# Patient Record
Sex: Female | Born: 1960
Health system: Southern US, Community
[De-identification: ages and names within clinical notes are randomized; demographics above are authoritative.]

## PROBLEM LIST (undated history)

## (undated) DIAGNOSIS — Z9109 Other allergy status, other than to drugs and biological substances: Secondary | ICD-10-CM

## (undated) DIAGNOSIS — Z8041 Family history of malignant neoplasm of ovary: Secondary | ICD-10-CM

## (undated) DIAGNOSIS — Z87442 Personal history of urinary calculi: Secondary | ICD-10-CM

## (undated) DIAGNOSIS — Z950 Presence of cardiac pacemaker: Secondary | ICD-10-CM

## (undated) DIAGNOSIS — IMO0002 Reserved for concepts with insufficient information to code with codable children: Secondary | ICD-10-CM

## (undated) DIAGNOSIS — E0590 Thyrotoxicosis, unspecified without thyrotoxic crisis or storm: Secondary | ICD-10-CM

## (undated) DIAGNOSIS — I10 Essential (primary) hypertension: Secondary | ICD-10-CM

## (undated) DIAGNOSIS — E785 Hyperlipidemia, unspecified: Secondary | ICD-10-CM

## (undated) DIAGNOSIS — F419 Anxiety disorder, unspecified: Secondary | ICD-10-CM

## (undated) DIAGNOSIS — J849 Interstitial pulmonary disease, unspecified: Secondary | ICD-10-CM

## (undated) DIAGNOSIS — K219 Gastro-esophageal reflux disease without esophagitis: Secondary | ICD-10-CM

## (undated) DIAGNOSIS — J8410 Pulmonary fibrosis, unspecified: Secondary | ICD-10-CM

## (undated) HISTORY — PX: COSMETIC SURGERY: SHX468

## (undated) HISTORY — PX: TUBAL LIGATION: SHX77

## (undated) HISTORY — DX: Essential (primary) hypertension: I10

## (undated) HISTORY — DX: Other allergy status, other than to drugs and biological substances: Z91.09

## (undated) HISTORY — DX: Reserved for concepts with insufficient information to code with codable children: IMO0002

## (undated) HISTORY — DX: Family history of malignant neoplasm of ovary: Z80.41

## (undated) HISTORY — PX: MOHS SURGERY: SUR867

## (undated) HISTORY — DX: Anxiety disorder, unspecified: F41.9

## (undated) HISTORY — DX: Thyrotoxicosis, unspecified without thyrotoxic crisis or storm: E05.90

## (undated) HISTORY — DX: Hyperlipidemia, unspecified: E78.5

## (undated) HISTORY — DX: Interstitial pulmonary disease, unspecified: J84.9

---

## 2004-10-23 ENCOUNTER — Ambulatory Visit: Payer: Self-pay | Admitting: General Surgery

## 2005-05-13 ENCOUNTER — Ambulatory Visit: Payer: Self-pay | Admitting: General Surgery

## 2005-06-10 ENCOUNTER — Ambulatory Visit: Payer: Self-pay | Admitting: Internal Medicine

## 2005-07-22 ENCOUNTER — Ambulatory Visit: Payer: Self-pay | Admitting: Internal Medicine

## 2005-08-19 ENCOUNTER — Ambulatory Visit: Payer: Self-pay | Admitting: Internal Medicine

## 2005-10-25 ENCOUNTER — Ambulatory Visit: Payer: Self-pay | Admitting: Internal Medicine

## 2005-11-05 ENCOUNTER — Ambulatory Visit: Payer: Self-pay | Admitting: Internal Medicine

## 2006-06-13 ENCOUNTER — Ambulatory Visit: Payer: Self-pay | Admitting: Internal Medicine

## 2006-10-27 ENCOUNTER — Ambulatory Visit: Payer: Self-pay | Admitting: Internal Medicine

## 2007-03-15 ENCOUNTER — Ambulatory Visit: Payer: Self-pay | Admitting: Family Medicine

## 2007-03-15 ENCOUNTER — Encounter (INDEPENDENT_AMBULATORY_CARE_PROVIDER_SITE_OTHER): Payer: Self-pay | Admitting: Internal Medicine

## 2007-03-15 DIAGNOSIS — R300 Dysuria: Secondary | ICD-10-CM | POA: Insufficient documentation

## 2007-03-15 LAB — CONVERTED CEMR LAB
Bilirubin Urine: NEGATIVE
Glucose, Urine, Semiquant: NEGATIVE
Ketones, urine, test strip: NEGATIVE
Nitrite: NEGATIVE
Protein, U semiquant: NEGATIVE
Specific Gravity, Urine: <1.005
Urobilinogen, UA: NEGATIVE
WBC Urine, dipstick: NEGATIVE
pH: 6.5

## 2007-03-16 ENCOUNTER — Telehealth (INDEPENDENT_AMBULATORY_CARE_PROVIDER_SITE_OTHER): Payer: Self-pay | Admitting: *Deleted

## 2007-05-30 ENCOUNTER — Telehealth (INDEPENDENT_AMBULATORY_CARE_PROVIDER_SITE_OTHER): Payer: Self-pay | Admitting: *Deleted

## 2007-06-05 DIAGNOSIS — J309 Allergic rhinitis, unspecified: Secondary | ICD-10-CM | POA: Insufficient documentation

## 2007-06-05 DIAGNOSIS — F411 Generalized anxiety disorder: Secondary | ICD-10-CM | POA: Insufficient documentation

## 2007-06-05 DIAGNOSIS — I10 Essential (primary) hypertension: Secondary | ICD-10-CM | POA: Insufficient documentation

## 2007-06-21 ENCOUNTER — Ambulatory Visit: Payer: Self-pay | Admitting: Internal Medicine

## 2007-06-21 DIAGNOSIS — E785 Hyperlipidemia, unspecified: Secondary | ICD-10-CM | POA: Insufficient documentation

## 2007-07-04 ENCOUNTER — Telehealth (INDEPENDENT_AMBULATORY_CARE_PROVIDER_SITE_OTHER): Payer: Self-pay | Admitting: *Deleted

## 2007-08-28 ENCOUNTER — Telehealth (INDEPENDENT_AMBULATORY_CARE_PROVIDER_SITE_OTHER): Payer: Self-pay | Admitting: *Deleted

## 2007-10-25 ENCOUNTER — Telehealth (INDEPENDENT_AMBULATORY_CARE_PROVIDER_SITE_OTHER): Payer: Self-pay | Admitting: *Deleted

## 2008-02-15 ENCOUNTER — Telehealth (INDEPENDENT_AMBULATORY_CARE_PROVIDER_SITE_OTHER): Payer: Self-pay | Admitting: *Deleted

## 2008-04-15 ENCOUNTER — Telehealth (INDEPENDENT_AMBULATORY_CARE_PROVIDER_SITE_OTHER): Payer: Self-pay | Admitting: *Deleted

## 2008-05-29 ENCOUNTER — Ambulatory Visit: Payer: Self-pay | Admitting: Family Medicine

## 2008-06-14 ENCOUNTER — Telehealth: Payer: Self-pay | Admitting: Internal Medicine

## 2008-07-04 ENCOUNTER — Ambulatory Visit: Payer: Self-pay | Admitting: Family Medicine

## 2008-07-04 LAB — CONVERTED CEMR LAB

## 2008-07-15 ENCOUNTER — Telehealth (INDEPENDENT_AMBULATORY_CARE_PROVIDER_SITE_OTHER): Payer: Self-pay | Admitting: *Deleted

## 2008-07-29 ENCOUNTER — Ambulatory Visit: Payer: Self-pay | Admitting: Family Medicine

## 2008-08-13 ENCOUNTER — Telehealth (INDEPENDENT_AMBULATORY_CARE_PROVIDER_SITE_OTHER): Payer: Self-pay | Admitting: *Deleted

## 2008-08-28 ENCOUNTER — Ambulatory Visit: Payer: Self-pay | Admitting: Family Medicine

## 2008-08-28 DIAGNOSIS — Z87891 Personal history of nicotine dependence: Secondary | ICD-10-CM | POA: Insufficient documentation

## 2008-09-29 LAB — HM PAP SMEAR

## 2008-09-29 LAB — CONVERTED CEMR LAB: Pap Smear: NORMAL

## 2008-09-29 LAB — HM MAMMOGRAPHY: HM Mammogram: NORMAL

## 2008-10-16 ENCOUNTER — Encounter: Payer: Self-pay | Admitting: Family Medicine

## 2008-11-08 ENCOUNTER — Telehealth: Payer: Self-pay | Admitting: Family Medicine

## 2008-12-09 ENCOUNTER — Telehealth: Payer: Self-pay | Admitting: Family Medicine

## 2009-01-08 ENCOUNTER — Telehealth: Payer: Self-pay | Admitting: Family Medicine

## 2009-01-16 ENCOUNTER — Ambulatory Visit: Payer: Self-pay | Admitting: Family Medicine

## 2009-02-03 ENCOUNTER — Telehealth: Payer: Self-pay | Admitting: Family Medicine

## 2009-02-13 ENCOUNTER — Ambulatory Visit: Payer: Self-pay | Admitting: Family Medicine

## 2009-03-05 ENCOUNTER — Telehealth: Payer: Self-pay | Admitting: Family Medicine

## 2009-03-31 ENCOUNTER — Telehealth: Payer: Self-pay | Admitting: Family Medicine

## 2009-04-29 ENCOUNTER — Telehealth: Payer: Self-pay | Admitting: Family Medicine

## 2009-06-04 ENCOUNTER — Telehealth: Payer: Self-pay | Admitting: Family Medicine

## 2009-06-18 ENCOUNTER — Ambulatory Visit: Payer: Self-pay | Admitting: Family Medicine

## 2009-07-31 ENCOUNTER — Telehealth: Payer: Self-pay | Admitting: Family Medicine

## 2009-08-12 ENCOUNTER — Ambulatory Visit: Payer: Self-pay | Admitting: Family Medicine

## 2009-08-12 DIAGNOSIS — M545 Low back pain, unspecified: Secondary | ICD-10-CM | POA: Insufficient documentation

## 2009-08-12 LAB — CONVERTED CEMR LAB
Bacteria, UA: 0
Bilirubin Urine: NEGATIVE
Glucose, Urine, Semiquant: NEGATIVE
Ketones, urine, test strip: NEGATIVE
Nitrite: NEGATIVE
RBC / HPF: 0
Specific Gravity, Urine: 1.01
Urobilinogen, UA: 0.2
WBC Urine, dipstick: NEGATIVE
WBC, UA: 0 cells/hpf
pH: 7.5

## 2009-08-13 ENCOUNTER — Encounter (INDEPENDENT_AMBULATORY_CARE_PROVIDER_SITE_OTHER): Payer: Self-pay | Admitting: Internal Medicine

## 2009-09-22 ENCOUNTER — Telehealth: Payer: Self-pay | Admitting: Family Medicine

## 2009-12-03 ENCOUNTER — Telehealth: Payer: Self-pay | Admitting: Family Medicine

## 2009-12-16 ENCOUNTER — Ambulatory Visit: Payer: Self-pay | Admitting: Family Medicine

## 2009-12-16 LAB — CONVERTED CEMR LAB
ALT: 15 units/L (ref 0–35)
AST: 18 units/L (ref 0–37)
Albumin: 4.3 g/dL (ref 3.5–5.2)
Alkaline Phosphatase: 53 units/L (ref 39–117)
BUN: 11 mg/dL (ref 6–23)
Basophils Absolute: 0 10*3/uL (ref 0.0–0.1)
Basophils Relative: 0.4 % (ref 0.0–3.0)
Bilirubin, Direct: 0.1 mg/dL (ref 0.0–0.3)
CO2: 31 meq/L (ref 19–32)
Calcium: 9.7 mg/dL (ref 8.4–10.5)
Chloride: 101 meq/L (ref 96–112)
Cholesterol: 241 mg/dL — ABNORMAL HIGH (ref 0–200)
Creatinine, Ser: 0.6 mg/dL (ref 0.4–1.2)
Direct LDL: 169.3 mg/dL
Eosinophils Absolute: 0.1 10*3/uL (ref 0.0–0.7)
Eosinophils Relative: 1.2 % (ref 0.0–5.0)
GFR calc non Af Amer: 113.88 mL/min (ref >60)
Glucose, Bld: 104 mg/dL — ABNORMAL HIGH (ref 70–99)
HCT: 41 % (ref 36.0–46.0)
HDL: 42 mg/dL (ref >39.00)
Hemoglobin: 14.1 g/dL (ref 12.0–15.0)
Lymphocytes Relative: 32.5 % (ref 12.0–46.0)
Lymphs Abs: 2.9 10*3/uL (ref 0.7–4.0)
MCHC: 34.3 g/dL (ref 30.0–36.0)
MCV: 93.3 fL (ref 78.0–100.0)
Monocytes Absolute: 0.6 10*3/uL (ref 0.1–1.0)
Monocytes Relative: 6.9 % (ref 3.0–12.0)
Neutro Abs: 5.3 10*3/uL (ref 1.4–7.7)
Neutrophils Relative %: 59 % (ref 43.0–77.0)
Platelets: 225 10*3/uL (ref 150.0–400.0)
Potassium: 3.8 meq/L (ref 3.5–5.1)
RBC: 4.39 M/uL (ref 3.87–5.11)
RDW: 11.7 % (ref 11.5–14.6)
Sodium: 143 meq/L (ref 135–145)
TSH: 1.6 microintl units/mL (ref 0.35–5.50)
Total Bilirubin: 0.7 mg/dL (ref 0.3–1.2)
Total CHOL/HDL Ratio: 6
Total Protein: 7.8 g/dL (ref 6.0–8.3)
Triglycerides: 267 mg/dL — ABNORMAL HIGH (ref 0.0–149.0)
VLDL: 53.4 mg/dL — ABNORMAL HIGH (ref 0.0–40.0)
WBC: 8.9 10*3/uL (ref 4.5–10.5)

## 2009-12-17 LAB — CONVERTED CEMR LAB
Cholesterol: 247 mg/dL — ABNORMAL HIGH (ref 0–200)
Direct LDL: 173.1 mg/dL
HDL: 40 mg/dL (ref >39.00)
Total CHOL/HDL Ratio: 6
Triglycerides: 270 mg/dL — ABNORMAL HIGH (ref 0.0–149.0)
VLDL: 54 mg/dL — ABNORMAL HIGH (ref 0.0–40.0)

## 2009-12-22 ENCOUNTER — Ambulatory Visit: Payer: Self-pay | Admitting: Family Medicine

## 2009-12-22 LAB — CONVERTED CEMR LAB
Cholesterol, target level: 200 mg/dL
HDL goal, serum: 40 mg/dL
LDL Goal: 130 mg/dL

## 2010-01-01 ENCOUNTER — Encounter: Payer: Self-pay | Admitting: Family Medicine

## 2010-01-05 ENCOUNTER — Telehealth: Payer: Self-pay | Admitting: Family Medicine

## 2010-02-03 ENCOUNTER — Encounter (INDEPENDENT_AMBULATORY_CARE_PROVIDER_SITE_OTHER): Payer: Self-pay | Admitting: *Deleted

## 2010-02-09 ENCOUNTER — Telehealth: Payer: Self-pay | Admitting: Family Medicine

## 2010-06-10 ENCOUNTER — Ambulatory Visit: Payer: Self-pay | Admitting: Family Medicine

## 2010-07-14 ENCOUNTER — Telehealth: Payer: Self-pay | Admitting: Family Medicine

## 2010-08-25 ENCOUNTER — Encounter: Payer: Self-pay | Admitting: Family Medicine

## 2010-08-27 ENCOUNTER — Ambulatory Visit: Payer: Self-pay | Admitting: Family Medicine

## 2010-08-27 ENCOUNTER — Encounter: Payer: Self-pay | Admitting: Family Medicine

## 2010-08-27 DIAGNOSIS — J8410 Pulmonary fibrosis, unspecified: Secondary | ICD-10-CM | POA: Insufficient documentation

## 2010-08-27 DIAGNOSIS — R0609 Other forms of dyspnea: Secondary | ICD-10-CM | POA: Insufficient documentation

## 2010-08-27 DIAGNOSIS — R0989 Other specified symptoms and signs involving the circulatory and respiratory systems: Secondary | ICD-10-CM | POA: Insufficient documentation

## 2010-08-29 LAB — CONVERTED CEMR LAB
ALT: 17 units/L (ref 0–35)
AST: 21 units/L (ref 0–37)
Albumin: 4.6 g/dL (ref 3.5–5.2)
Alkaline Phosphatase: 71 units/L (ref 39–117)
BUN: 11 mg/dL (ref 6–23)
Basophils Absolute: 0 10*3/uL (ref 0.0–0.1)
Basophils Relative: 0.4 % (ref 0.0–3.0)
Bilirubin, Direct: 0.1 mg/dL (ref 0.0–0.3)
CO2: 29 meq/L (ref 19–32)
CRP, High Sensitivity: 2.33 (ref 0.00–5.00)
Calcium: 9.8 mg/dL (ref 8.4–10.5)
Chloride: 100 meq/L (ref 96–112)
Creatinine, Ser: 0.5 mg/dL (ref 0.4–1.2)
Eosinophils Absolute: 0 10*3/uL (ref 0.0–0.7)
Eosinophils Relative: 0.5 % (ref 0.0–5.0)
GFR calc non Af Amer: 133.93 mL/min (ref >60.00)
Glucose, Bld: 92 mg/dL (ref 70–99)
HCT: 44.1 % (ref 36.0–46.0)
Hemoglobin: 15.3 g/dL — ABNORMAL HIGH (ref 12.0–15.0)
Lymphocytes Relative: 31.2 % (ref 12.0–46.0)
Lymphs Abs: 3.2 10*3/uL (ref 0.7–4.0)
MCHC: 34.8 g/dL (ref 30.0–36.0)
MCV: 93.9 fL (ref 78.0–100.0)
Monocytes Absolute: 0.6 10*3/uL (ref 0.1–1.0)
Monocytes Relative: 6.1 % (ref 3.0–12.0)
Neutro Abs: 6.4 10*3/uL (ref 1.4–7.7)
Neutrophils Relative %: 61.8 % (ref 43.0–77.0)
Platelets: 277 10*3/uL (ref 150.0–400.0)
Potassium: 4.1 meq/L (ref 3.5–5.1)
RBC: 4.69 M/uL (ref 3.87–5.11)
RDW: 12.6 % (ref 11.5–14.6)
Sed Rate: 16 mm/hr (ref 0–22)
Sodium: 137 meq/L (ref 135–145)
Total Bilirubin: 0.7 mg/dL (ref 0.3–1.2)
Total Protein: 7.8 g/dL (ref 6.0–8.3)
WBC: 10.4 10*3/uL (ref 4.5–10.5)

## 2010-10-06 ENCOUNTER — Ambulatory Visit
Admission: RE | Admit: 2010-10-06 | Discharge: 2010-10-06 | Payer: Self-pay | Source: Home / Self Care | Attending: Pulmonary Disease | Admitting: Pulmonary Disease

## 2010-10-06 DIAGNOSIS — R9389 Abnormal findings on diagnostic imaging of other specified body structures: Secondary | ICD-10-CM | POA: Insufficient documentation

## 2010-10-08 ENCOUNTER — Ambulatory Visit: Payer: Self-pay | Admitting: Internal Medicine

## 2010-10-12 ENCOUNTER — Other Ambulatory Visit: Payer: Self-pay | Admitting: Pulmonary Disease

## 2010-10-12 ENCOUNTER — Ambulatory Visit
Admission: RE | Admit: 2010-10-12 | Discharge: 2010-10-12 | Payer: Self-pay | Source: Home / Self Care | Attending: Pulmonary Disease | Admitting: Pulmonary Disease

## 2010-10-12 ENCOUNTER — Encounter: Payer: Self-pay | Admitting: Pulmonary Disease

## 2010-10-12 LAB — CBC WITH DIFFERENTIAL/PLATELET
Basophils Absolute: 0.1 10*3/uL (ref 0.0–0.1)
Basophils Relative: 0.6 % (ref 0.0–3.0)
Eosinophils Absolute: 0.1 10*3/uL (ref 0.0–0.7)
Eosinophils Relative: 0.9 % (ref 0.0–5.0)
HCT: 44.3 % (ref 36.0–46.0)
Hemoglobin: 15.1 g/dL — ABNORMAL HIGH (ref 12.0–15.0)
Lymphocytes Relative: 31.4 % (ref 12.0–46.0)
Lymphs Abs: 3.6 10*3/uL (ref 0.7–4.0)
MCHC: 34 g/dL (ref 30.0–36.0)
MCV: 93.7 fl (ref 78.0–100.0)
Monocytes Absolute: 0.8 10*3/uL (ref 0.1–1.0)
Monocytes Relative: 6.6 % (ref 3.0–12.0)
Neutro Abs: 6.9 10*3/uL (ref 1.4–7.7)
Neutrophils Relative %: 60.5 % (ref 43.0–77.0)
Platelets: 247 10*3/uL (ref 150.0–400.0)
RBC: 4.73 Mil/uL (ref 3.87–5.11)
RDW: 12.4 % (ref 11.5–14.6)
WBC: 11.5 10*3/uL — ABNORMAL HIGH (ref 4.5–10.5)

## 2010-10-12 LAB — CONVERTED CEMR LAB
Anti Nuclear Antibody(ANA): NEGATIVE
Rhuematoid fact SerPl-aCnc: <10 intl units/mL (ref <=14)

## 2010-10-12 LAB — PROTIME-INR
INR: 1 ratio (ref 0.8–1.0)
Prothrombin Time: 11.1 s (ref 10.2–12.4)

## 2010-10-12 LAB — SEDIMENTATION RATE: Sed Rate: 16 mm/hr (ref 0–22)

## 2010-10-12 LAB — APTT: aPTT: 30.1 s — ABNORMAL HIGH (ref 21.7–28.8)

## 2010-10-14 ENCOUNTER — Telehealth (INDEPENDENT_AMBULATORY_CARE_PROVIDER_SITE_OTHER): Payer: Self-pay | Admitting: *Deleted

## 2010-10-19 ENCOUNTER — Telehealth: Payer: Self-pay | Admitting: Pulmonary Disease

## 2010-10-19 ENCOUNTER — Encounter: Payer: Self-pay | Admitting: Pulmonary Disease

## 2010-10-19 ENCOUNTER — Ambulatory Visit
Admission: RE | Admit: 2010-10-19 | Discharge: 2010-10-19 | Payer: Self-pay | Source: Home / Self Care | Attending: Pulmonary Disease | Admitting: Pulmonary Disease

## 2010-10-21 NOTE — Progress Notes (Signed)
Summary: refill request for lorazeapam  Phone Note Refill Request Message from:  Fax from Pharmacy  Refills Requested: Medication #1:  ATIVAN 0.5 MG  TABS 1 by mouth two times a day as needed anxiety   Last Refilled: 03/31/2009 Faxed request from [facility],  phone [phone].  Initial call taken by: Lowella Petties CMA,  March 31, 2009 12:09 PM  Follow-up for Phone Call        I filled 03/05/2009 with 1 refill - should be good through now until mid August Follow-up by: [name] MD,  March 31, 2009 12:17 PM  Additional Follow-up for Phone Call Additional follow up Details #1::        they just wanted to have one on refill because she is on automatic refill. So she is good until the end or middle of next month. Additional Follow-up by: Benny Lennert CMA,  March 31, 2009 1:58 PM

## 2010-10-21 NOTE — Progress Notes (Signed)
Summary: lorazepam  Phone Note Refill Request Message from:  Fax from Pharmacy on Feb 03, 2009 2:03 PM  Refills Requested: Medication #1:  ATIVAN 0.5 MG  TABS 1 by mouth two times a day as needed anxiety   Supply Requested: [facility] [phone]  Initial call taken by: Benny Lennert CMA,  Feb 03, 2009 2:04 PM  Follow-up for Phone Call        The patient was sent #60 at 01/08/2009, a little early. Also has one refill, so should be able to refill 02/07/2009 Follow-up by: [name] MD,  Feb 03, 2009 2:08 PM  Additional Follow-up for Phone Call Additional follow up Details #1::        per pharmacy patient picked up last refill. so they were letting us know Additional Follow-up by: Benny Lennert CMA,  Feb 03, 2009 2:36 PM

## 2010-10-21 NOTE — Progress Notes (Signed)
  Phone Note Call from Patient Call back at cell [phone]   Caller: Patient Call For: letvak Summary of Call: pt was seen yesterday for possible uti, pt was prescribed "the pill that turs your urine orange" pt is having a hard time with symptoms and wants rx called in for abx.  [facility] [address] Initial call taken by: Liane Comber,  March 16, 2007 1:21 PM  Follow-up for Phone Call        Rx attatched.  Culture to be back tomorrow  Additional Follow-up for Phone Call Additional follow up Details #1::        rx called in ..................................................................Marland KitchenLiane Comber  March 16, 2007 4:54 PM   New/Updated Medications: CIPROFLOXACIN HCL 500 MG  TABS (CIPROFLOXACIN HCL) 1 bid  New/Updated Medications: CIPROFLOXACIN HCL 500 MG  TABS (CIPROFLOXACIN HCL) 1 bid  Prescriptions: CIPROFLOXACIN HCL 500 MG  TABS (CIPROFLOXACIN HCL) 1 bid  #14 x 0   Entered and Authorized by:   Gildardo Griffes FNP   Signed by:   Gildardo Griffes FNP on 03/16/2007   Method used:   Print then Give to Patient   RxID:   [id number]

## 2010-10-21 NOTE — Progress Notes (Signed)
Summary: refill request for lorazepam  Phone Note Refill Request Message from:  Fax from Pharmacy  Refills Requested: Medication #1:  ATIVAN 0.5 MG  TABS 1 by mouth two times a day as needed anxiety   Last Refilled: 06/04/2009 Faxed request from [facility], [place], phone [phone].  Initial call taken by: Lowella Petties CMA,  September 22, 2009 8:54 AM  Follow-up for Phone Call        Appears she has not been weaning as planned on 05/2009.Anne KitchenNot early for refills. Will fill one time..further refills on Dr. Cyndie Chime return.  Follow-up by: Kerby Nora MD,  September 22, 2009 9:00 AM  Additional Follow-up for Phone Call Additional follow up Details #1::        Medication phoned to pharmacy.  Additional Follow-up by: Delilah Shan CMA (AAMA),  September 22, 2009 9:07 AM    Prescriptions: ATIVAN 0.5 MG  TABS (LORAZEPAM) 1 by mouth two times a day as needed anxiety  #60 x 0   Entered and Authorized by:   Kerby Nora MD   Signed by:   Kerby Nora MD on 09/22/2009   Method used:   Telephoned to ...       [facility] [address] (retail)       [address] [place], [place]  [address]       Ph: [phone]       Fax: [phone]   RxID:   [id number]

## 2010-10-21 NOTE — Progress Notes (Signed)
  Phone Note From Pharmacy   Caller: [facility] [phone] Summary of Call: refill lorazepam 0.5mg   1 two times a day as needed  #60 Initial call taken by: Wandra Mannan,  July 04, 2007 8:20 AM  Follow-up for Phone Call        #60 x 1 approved Follow-up by: Cindee Salt MD,  July 04, 2007 10:23 AM  Additional Follow-up for Phone Call Additional follow up Details #1::        [facility]ed [facility] Additional Follow-up by: Wandra Mannan,  July 04, 2007 10:24 AM      Prescriptions: LORAZEPAM 0.5 MG TABS (LORAZEPAM) Take 1 tablet by mouth twice a day  #60 x 1   Entered by:   Wandra Mannan   Authorized by:   Cindee Salt MD   Signed by:   Wandra Mannan on 07/04/2007   Method used:   Printed then faxed to ...         RxID:   [id number]

## 2010-10-21 NOTE — Assessment & Plan Note (Signed)
Summary: CPX/CLE   Vital Signs:  Patient Profile:   50 Years Old Female Weight:      179.13 pounds Temp:     97.3 degrees F oral Pulse rate:   76 / minute BP sitting:   154 / 98  (right arm)  Vitals Entered By: Wandra Mannan (June 21, 2007 9:33 AM)                 Serial Vital Signs/Assessments:  Time      Position  BP       Pulse  Resp  Temp     By           R Arm     134/90                         Cindee Salt MD   Chief Complaint:  cpx.  History of Present Illness: Doing well  Uses lorazepam 11AM and 9PM and feels that she does very well with this No problems with depression Sleeps fine  Checks her own blood sugar--husband and FIL have machines Usually around 80's  Current Allergies (reviewed today): ! CODEINE * SULFA (SULFONAMIDES) GROUP * PAROXETINE ANALOGUES GROUP PERCOCET (OXYCODONE-ACETAMINOPHEN)  Past Medical History:    Reviewed history from 06/05/2007 and no changes required:       Allergic rhinitis       Anxiety       Hypertension       Hyperlipidemia              CONSULTANTS       Dr Limmie Patricia         Past Surgical History:    Reviewed history from 06/05/2007 and no changes required:       C-section x 1 1987       Tubal ligation 1989       Cervical Cryotherapy 1980's   Family History:    Mom with anxiety and HTN    Dad with chronic back problems    1 sister    Mat GF had CHF.     Mat great uncles may have had early CAD    Mat great aunt with breast cancer    No colon cancer       Social History:    Reviewed history from 06/05/2007 and no changes required:       Marital Status: Married       Children: 1       Occupation: Designer, industrial/product Asst. [employer]       Current Smoker       Alcohol use-no    Review of Systems  The patient denies vision loss, decreased hearing, chest pain, syncope, dyspnea on exhertion, peripheral edema, prolonged cough, abdominal pain, melena, hematochezia, incontinence, muscle weakness,  suspicious skin lesions, depression, abnormal bleeding, and enlarged lymph nodes.         Wears seat belt Teeth are fine--sees dentist Treis to exercise fairly regularly Weight has been stable Some drainage now from ragweed--has restarted flonase now No periods in over 1 year No bowel or urinary problems No sexual problems No sig arthritis problems--occ knee pain but nothing worrisome   Physical Exam  General:     alert and normal appearance.   Eyes:     pupils equal, pupils round, pupils reactive to light, and no optic disk abnormalities.   Ears:     R ear normal and L ear normal.  Mouth:     no lesions.   Neck:     supple, no masses, no thyromegaly, no carotid bruits, and no cervical lymphadenopathy.   Lungs:     normal respiratory effort and normal breath sounds.   Heart:     normal rate, regular rhythm, no murmur, and no gallop.   Abdomen:     soft, non-tender, no hepatomegaly, and no splenomegaly.   Msk:     no joint tenderness and no joint swelling.   Extremities:     no edema Skin:     no rashes and no suspicious lesions.   Axillary Nodes:     No palpable lymphadenopathy Psych:     normally interactive, good eye contact, not anxious appearing, and not depressed appearing.      Impression & Recommendations:  Problem # 1:  ROUTINE GENERAL MEDICAL EXAM, NON PEDIATRIC (ICD-V70.0) Assessment: Comment Only doing well Sugar is normal Discussed fitness, etc  Problem # 2:  HYPERTENSION (ICD-401.9) Assessment: Improved BP okay but needs to be followed over time  Problem # 3:  HYPERLIPIDEMIA (ICD-272.4) Assessment: Comment Only no meds will work on fitness  Complete Medication List: 1)  Lorazepam 0.5 Mg Tabs (Lorazepam) .... Take 1 tablet by mouth twice a day   Patient Instructions: 1)  Schedule physical for about  1 year    ] Current Allergies (reviewed today): ! CODEINE * SULFA (SULFONAMIDES) GROUP * PAROXETINE ANALOGUES GROUP PERCOCET  (OXYCODONE-ACETAMINOPHEN) Current Medications (including changes made in today's visit):  LORAZEPAM 0.5 MG TABS (LORAZEPAM) Take 1 tablet by mouth twice a day

## 2010-10-21 NOTE — Letter (Signed)
Summary: Results Follow up Letter  [place] [facility]  [address] [place], [place] [address]   Phone: [phone]  Fax: [phone]    02/03/2010 MRN: [id number]     [facility] [address] [place], [place]  [address]    Dear Ms. Keathley,  The following are the results of your recent test(s):  Test         Result    Pap Smear:        Normal _____  Not Normal _____ Comments: ______________________________________________________ Cholesterol: LDL(Bad cholesterol):         Your goal is less than:         HDL (Good cholesterol):       Your goal is more than: Comments:  ______________________________________________________ Mammogram:        Normal __x___  Not Normal _____ Comments:Repeat in 1 year  ___________________________________________________________________ Hemoccult:        Normal _____  Not normal _______ Comments:    _____________________________________________________________________ Other Tests:    We routinely do not discuss normal results over the telephone.  If you desire a copy of the results, or you have any questions about this information we can discuss them at your next office visit.   Sincerely,  [name] MD

## 2010-10-21 NOTE — Assessment & Plan Note (Signed)
Summary: ?UTI/CLE   Vital Signs:  Patient profile:   50 year old female Height:      63.5 inches Weight:      176.25 pounds BMI:     30.84 Temp:     98.3 degrees F oral Pulse rate:   72 / minute Pulse rhythm:   regular BP sitting:   148 / 96  (left arm) Cuff size:   large  Vitals Entered By: Lewanda Rife LPN (August 12, 2009 11:55 AM)  CC:  ?UTI low back and abdominal pain.  History of Present Illness: Here for low back pain--burns and hurts in low back , some lower abd pain--? UTI --onset x 2d --no dysuria, quick onset, small volumn of urine, no nocturia, no fever or chills, had nausea yesterday--sitting hurts, walking helps --no trauma or unusual activities--bending, lifting, etc  Allergies: 1)  ! Codeine 2)  * Sulfa (Sulfonamides) Group 3)  * Paroxetine Analogues Group 4)  Percocet (Oxycodone-Acetaminophen)  Review of Systems      See HPI  Physical Exam  General:  alert, well-developed, well-nourished, and well-hydrated.   Abdomen:  no CVA or suprapubic pain with palp Msk:  tender across L 4-S1 bilat, does not go into buttocks bilat --pain on forward flex  ~80 degrees, none on lateral flex or rotation bilat, no pain on internal or external rotation of either hip Neurologic:  alert & oriented X3 and gait normal.   Skin:  turgor normal, color normal, and no rashes.   Psych:  normally interactive, flat affect, and subdued.     Impression & Recommendations:  Problem # 1:  LOW BACK PAIN, ACUTE (ICD-724.2) Assessment New doubt UTI--U/A neg on micro will culture and if pos, will treat--call if worsens next 2d--doc on call will use IBP 400-800 q4-8h as needed with food see back if not improved in 7-10d Her updated medication list for this problem includes:    Ibuprofen 200 Mg Tabs (Ibuprofen) ..... Otc as directed.  Orders: T-Culture, Urine ([id number]) UA Dipstick W/ Micro (manual) ([id number])  Complete Medication List: 1)  Ventolin Hfa 108 (90 Base) Mcg/act Aers  (Albuterol sulfate) .... 2 puffs every 4 hours as needed 2)  Hydrochlorothiazide 25 Mg Tabs (Hydrochlorothiazide) .... Take 1 tab by mouth every morning 3)  Ativan 0.5 Mg Tabs (Lorazepam) .[name].. 1 by mouth two times a day as needed anxiety 4)  Ibuprofen 200 Mg Tabs (Ibuprofen) .... Otc as directed.  Current Allergies (reviewed today): ! CODEINE * SULFA (SULFONAMIDES) GROUP * PAROXETINE ANALOGUES GROUP PERCOCET (OXYCODONE-ACETAMINOPHEN)  Laboratory Results   Urine Tests  Date/Time Received: August 12, 2009 11:58 AM  Date/Time Reported: August 12, 2009 11:58 AM   Routine Urinalysis   Color: yellow Appearance: Clear Glucose: negative   (Normal Range: Negative) Bilirubin: negative   (Normal Range: Negative) Ketone: negative   (Normal Range: Negative) Spec. Gravity: 1.010   (Normal Range: 1.003-1.035) Blood: trace-intact   (Normal Range: Negative) pH: 7.5   (Normal Range: 5.0-8.0) Protein: trace   (Normal Range: Negative) Urobilinogen: 0.2   (Normal Range: 0-1) Nitrite: negative   (Normal Range: Negative) Leukocyte Esterace: negative   (Normal Range: Negative)  Urine Microscopic WBC/HPF: 0 RBC/HPF: 0 Bacteria/HPF: 0 Epithelial/HPF: occ       Laboratory Results   Urine Tests    Routine Urinalysis   Color: yellow Appearance: Clear Glucose: negative   (Normal Range: Negative) Bilirubin: negative   (Normal Range: Negative) Ketone: negative   (Normal Range: Negative) Spec. Gravity: 1.010   (  Normal Range: 1.003-1.035) Blood: trace-intact   (Normal Range: Negative) pH: 7.5   (Normal Range: 5.0-8.0) Protein: trace   (Normal Range: Negative) Urobilinogen: 0.2   (Normal Range: 0-1) Nitrite: negative   (Normal Range: Negative) Leukocyte Esterace: negative   (Normal Range: Negative)  Urine Microscopic WBC/hpf: 0 RBC/hpf: 0 Bacteria: 0 Epithelial: occ

## 2010-10-21 NOTE — Assessment & Plan Note (Signed)
Summary: 8 M F/U DLO   Vital Signs:  Patient profile:   50 year old female Height:      63.5 inches Weight:      170.0 pounds BMI:     29.75 Temp:     98.6 degrees F oral Pulse rate:   64 / minute Pulse rhythm:   regular BP sitting:   150 / 90  (left arm) Cuff size:   regular  Vitals Entered By: Benny Lennert CMA Duncan Dull) (June 10, 2010 12:10 PM)  Serial Vital Signs/Assessments:  Time      Position  BP       Pulse  Resp  Temp     By                     130/80                         [name] MD   History of Present Illness: Chief complaint 6 month follow up  50 year old female:  130/80, bp stable on htn  anxiety: intermittently anxious, takes 1-2 ativan a day.   no exercise.   chol: resistant to meds, has not started fish oil   Clinical Review Panels:  Lipid Management   Cholesterol:  247 (12/16/2009)   LDL (bad choesterol):  refused (07/04/2008)   HDL (good cholesterol):  40.00 (12/16/2009)  CBC   WBC:  8.9 (12/16/2009)   RBC:  4.39 (12/16/2009)   Hgb:  14.1 (12/16/2009)   Hct:  41.0 (12/16/2009)   Platelets:  225.0 (12/16/2009)   MCV  93.3 (12/16/2009)   MCHC  34.3 (12/16/2009)   RDW  11.7 (12/16/2009)   PMN:  59.0 (12/16/2009)   Lymphs:  32.5 (12/16/2009)   Monos:  6.9 (12/16/2009)   Eosinophils:  1.2 (12/16/2009)   Basophil:  0.4 (12/16/2009)  Complete Metabolic Panel   Glucose:  104 (12/16/2009)   Sodium:  143 (12/16/2009)   Potassium:  3.8 (12/16/2009)   Chloride:  101 (12/16/2009)   CO2:  31 (12/16/2009)   BUN:  11 (12/16/2009)   Creatinine:  0.6 (12/16/2009)   Albumin:  4.3 (12/16/2009)   Total Protein:  7.8 (12/16/2009)   Calcium:  9.7 (12/16/2009)   Total Bili:  0.7 (12/16/2009)   Alk Phos:  53 (12/16/2009)   SGPT (ALT):  15 (12/16/2009)   SGOT (AST):  18 (12/16/2009)   Allergies: 1)  ! Codeine 2)  * Sulfa (Sulfonamides) Group 3)  * Paroxetine Analogues Group 4)  Percocet (Oxycodone-Acetaminophen)  Past  History:  Past medical, surgical, family and social histories (including risk factors) reviewed, and no changes noted (except as noted below).  Past Medical History: Reviewed history from 06/21/2007 and no changes required. Allergic rhinitis Anxiety Hypertension Hyperlipidemia  CONSULTANTS Dr Limmie Patricia  Past Surgical History: Reviewed history from 06/05/2007 and no changes required. C-section x 1 1987 Tubal ligation 1989 Cervical Cryotherapy 1980's  Family History: Reviewed history from 06/21/2007 and no changes required. Mom with anxiety and HTN Dad with chronic back problems 1 sister Mat GF had CHF.  Mat great uncles may have had early CAD Mat great aunt with breast cancer No colon cancer    Social History: Reviewed history from 07/04/2008 and no changes required. 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 CV: No chest pain or SOB GI: No noted N or V Otherwise, pertinent positives and negatives are noted in the HPI.   Physical Exam  Additional Exam:  GEN: WDWN, NAD, Non-toxic, A & O x 3 HEENT: Atraumatic, Normocephalic. Neck supple. No masses, No LAD. Ears and Nose: No external deformity. CV: RRR, No M/G/R. No JVD. No thrill. No extra heart sounds. PULM: CTA B, no wheezes, crackles, rhonchi. No retractions. No resp. distress. No accessory muscle use. EXTR: No c/c/e NEURO: Normal gait.  PSYCH: Normally interactive. Conversant. Not depressed or anxious appearing.  Calm demeanor.     Impression & Recommendations:  Problem # 1:  HYPERLIPIDEMIA (ICD-272.4) Assessment Unchanged  no meds will work on fitness  Problem # 2:  HYPERTENSION (ICD-401.9) Assessment: Unchanged 130/80 on my check  Her updated medication list for this problem includes:     Hydrochlorothiazide 25 Mg Tabs (Hydrochlorothiazide) .[name]... Take 1 tab by mouth every morning  BP today: 150/90 Prior BP: 130/88 (12/22/2009)  Prior 10 Yr Risk Heart Disease: Not enough information (12/22/2009)  Labs Reviewed: K+: 3.8 (12/16/2009) Creat: : 0.6 (12/16/2009)   Chol: 247 (12/16/2009)   HDL: 40.00 (12/16/2009)   LDL: refused (07/04/2008)   TG: 270.0 (12/16/2009)  Problem # 3:  ANXIETY (ICD-300.00) stable  Her updated medication list for this problem includes:    Ativan 0.5 Mg Tabs (Lorazepam) .[name]... 1 by mouth two times a day as needed anxiety  Complete Medication List: 1)  Hydrochlorothiazide 25 Mg Tabs (Hydrochlorothiazide) .... Take 1 tab by mouth every morning 2)  Ativan 0.5 Mg Tabs (Lorazepam) .[name].. 1 by mouth two times a day as needed anxiety  Patient Instructions: 1)  f/u 6 months 2)  FLP: 272.4 3)  BMP: v77.1 4)  Hepatic Function Panel: v58.69 5)  CBC with diff: 780.79 6)  TSH: 780.79  Prescriptions: ATIVAN 0.5 MG  TABS (LORAZEPAM) 1 by mouth two times a day as needed anxiety  #60 x 5   Entered and Authorized by:   [name] MD   Signed by:   [name] MD on 06/10/2010   Method used:   Print then Give to Patient   RxID:   [id number] HYDROCHLOROTHIAZIDE 25 MG  TABS (HYDROCHLOROTHIAZIDE) Take 1 tab by mouth every morning  #90 x 3   Entered and Authorized by:   [name] MD   Signed by:   [name] MD on 06/10/2010   Method used:   Print then Give to Patient   RxID:   [id number]   Current Allergies (reviewed today): ! CODEINE * SULFA (SULFONAMIDES) GROUP * PAROXETINE ANALOGUES GROUP PERCOCET (OXYCODONE-ACETAMINOPHEN)

## 2010-10-21 NOTE — Assessment & Plan Note (Signed)
Summary: fu/dlo   Vital Signs:  Patient profile:   50 year old female Height:      63.5 inches Weight:      180.4 pounds BMI:     31.57 Temp:     97.9 degrees F oral Pulse rate:   78 / minute Pulse rhythm:   regular BP sitting:   160 / 100  (left arm) Cuff size:   regular  Vitals Entered By: Benny Lennert CMA (January 16, 2009 11:59 AM)  History of Present Illness: Chief complaint follow up  1. HTN: 160/100 on my recheck as well. Compliant with meds  - highly anxious right now.  2. Anxiety:  generally, the patient has been highly anxious over the last 4-5 months. Her daughter moved home. She is having anxiety attacks and most every day. Her increased use of Ativan has been noted. She has been reluctant and refused to take oral antidepressants in the past. She has tried Paxil and had a difficult time with that. A lot of this is situational, and her daughter dropped out of college early, now she is doing things that she and her husband do not agree with. She denies any suicidality homicidality, and she has mild to minimal depressive symptoms.  Daughter moved back home, got out of college. Having a hard tim dealing with that. She will come in, often avoids her.  Interaction with daughter.  Not much to deal with that stress. Also ill with her husband. Daughter is [age]. Getting piercings and things like this.  Tried to stop smoking, but not been able to quit.   Not exercising.   09/2008, normal mammo pap normal   Allergies: 1)  ! Codeine 2)  * Sulfa (Sulfonamides) Group 3)  * Paroxetine Analogues Group 4)  Percocet (Oxycodone-Acetaminophen)  Past History:  Past medical, surgical, family and social histories (including risk factors) reviewed, and no changes noted (except as noted below).  Past Medical History:    Reviewed history from 06/21/2007 and no changes required:    Allergic rhinitis    Anxiety    Hypertension    Hyperlipidemia        CONSULTANTS    Dr  Limmie Patricia  Past Surgical History:    Reviewed history from 06/05/2007 and no changes required:    C-section x 1 1987    Tubal ligation 1989    Cervical Cryotherapy 1980's  Family History:    Reviewed history from 06/21/2007 and no changes required:       Mom with anxiety and HTN       Dad with chronic back problems       1 sister       Mat GF had CHF.        Mat great uncles may have had early CAD       Mat great aunt with breast cancer       No colon cancer          Social History:    Reviewed history from 07/04/2008 and no changes required:       Marital Status: Married       Children: 1       Occupation: Designer, industrial/product Asst. [employer]       Current Smoker       Alcohol use-no       Drug use-no       Regular exercise-no  Review of Systems      See HPI General:  Denies chills,  fever, and sweats. Psych:  Complains of anxiety, depression, easily tearful, irritability, and panic attacks; denies alternate hallucination ( auditory/visual), easily angered, mental problems, sense of great danger, suicidal thoughts/plans, thoughts of violence, unusual visions or sounds, and thoughts /plans of harming others.  Physical Exam  Psych:  dysphoric affect, labile affect, tearful, and slightly anxious.   Additional Exam:  GEN: WDWN, NAD, Non-toxic, A & O x 3 HEENT: Atraumatic, Normocephalic. Neck supple. No masses, No LAD. Ears and Nose: No external deformity. CV: RRR, No M/G/R. No JVD. No thrill. No extra heart sounds. PULM: CTA B, no wheezes, crackles, rhonchi. No retractions. No resp. distress. No accessory muscle use. EXTR: No c/c/e NEURO: Normal gait.     Impression & Recommendations:  Problem # 1:  HYPERTENSION (ICD-401.9) Assessment Deteriorated not well controlled - will try to control anxiety better and recheck in 1 month  Her updated medication list for this problem includes:    Hydrochlorothiazide 12.5 Mg Tabs (Hydrochlorothiazide) .[name]... Take 1 tab  by mouth every  morning  Problem # 2:  ANXIETY (ICD-300.00) Assessment: Deteriorated >25 minutes spent in total face to face time with the patient with >50% of time spent in counselling and coordination of care  an extensive amount of time was spent counseling and reviewing the patient's home life, anxiety, depression history, her daughter and her husband's interaction with her. We did discuss the importance of exercise, I did recommend routine exercise, counseling, and we discussed at length positive benefits and potential side effects of different anti-depressants. f/u 1 month  Her updated medication list for this problem includes:    Ativan 0.5 Mg Tabs (Lorazepam) .[name]... 1 by mouth two times a day as needed anxiety    Citalopram Hydrobromide 20 Mg Tabs (Citalopram hydrobromide) .[name]... Take one tablet by mouth daily  Complete Medication List: 1)  Ventolin Hfa 108 (90 Base) Mcg/act Aers (Albuterol sulfate) .... 2 puffs q 4 hours prn 2)  Hydrochlorothiazide 12.5 Mg Tabs (Hydrochlorothiazide) .... Take 1 tab  by mouth every morning 3)  Ativan 0.5 Mg Tabs (Lorazepam) .[name].. 1 by mouth two times a day as needed anxiety 4)  Citalopram Hydrobromide 20 Mg Tabs (Citalopram hydrobromide) .... Take one tablet by mouth daily  Patient Instructions: 1)  follow-up 1 month Prescriptions: CITALOPRAM HYDROBROMIDE 20 MG TABS (CITALOPRAM HYDROBROMIDE) take one tablet by mouth daily  #30 x 3   Entered and Authorized by:   [name] MD   Signed by:   [name] MD on 01/16/2009   Method used:   Electronically to        [facility] [address]* (retail)       [address] [place], [place]  [address]       Ph: [phone]       Fax: [phone]   RxID:   [id number]     Current Allergies (reviewed today): ! CODEINE * SULFA (SULFONAMIDES) GROUP * PAROXETINE ANALOGUES GROUP PERCOCET (OXYCODONE-ACETAMINOPHEN) Last PAP:  refused (07/04/2008 8:24:33 AM) PAP Result Date:   09/29/2008 PAP Result:  normal Last Mammogram:  refused (07/04/2008 8:24:33 AM) Mammogram Result Date:  09/29/2008 Mammogram Result:  normal

## 2010-10-21 NOTE — Assessment & Plan Note (Signed)
Summary: 1 MONTH FOLLOW UP/[facility]   Vital Signs:  Patient profile:   50 year old female Height:      63.5 inches Weight:      179.4 pounds BMI:     31.39 Temp:     98.0 degrees F oral Pulse rate:   78 / minute Pulse rhythm:   regular BP sitting:   140 / 90  (left arm) Cuff size:   regular  Vitals Entered By: Benny Lennert CMA (Feb 13, 2009 12:04 PM)  History of Present Illness: Chief complaint 1 month follow up  50 year old female:  Dep / Anxiety:  HTN:  Doing about seventy five percent.   Depression History:      The patient denies a depressed mood most of the day and a diminished interest in her usual daily activities.  Positive alarm features for depression include fatigue (loss of energy) and impaired concentration (indecisiveness).  However, she denies insomnia, hypersomnia, psychomotor retardation, feelings of worthlessness (guilt), and recurrent thoughts of death or suicide.        Due to her current symptoms, it often takes extra effort to do the things she needs to do.        Comments:  BETTER, LESS FREQUENT ANXIETY ATTACKS, SOME SWEATING ON CELEXA. ABOUT 75% BETTER.  Allergies: 1)  ! Codeine 2)  * Sulfa (Sulfonamides) Group 3)  * Paroxetine Analogues Group 4)  Percocet (Oxycodone-Acetaminophen)  Review of Systems General:  Complains of sweats; denies chills. Neuro:  Denies tingling. Psych:  See HPI; Complains of anxiety and depression.  Physical Exam  Additional Exam:  GEN: WDWN, NAD, Non-toxic, A & O x 3 HEENT: Atraumatic, Normocephalic. Neck supple. No masses, No LAD. Ears and Nose: No external deformity. CV: RRR, No M/G/R. No JVD. No thrill. No extra heart sounds. PULM: CTA B, no wheezes, crackles, rhonchi. No retractions. No resp. distress. No accessory muscle use. EXTR: No c/c/e NEURO: Normal gait.  PSYCH: Normally interactive. Conversant. Not depressed or anxious appearing.  Calm demeanor.     Impression & Recommendations:  Problem # 1:   HYPERTENSION (ICD-401.9) not at goal, increase HCTZ to 25  Her updated medication list for this problem includes:    Hydrochlorothiazide 25 Mg Tabs (Hydrochlorothiazide) .[name]... Take 1 tab by mouth every morning  Problem # 2:  ANXIETY (ICD-300.00) been better a lot on below meds, able to tolerate SE on this dose  Her updated medication list for this problem includes:    Ativan 0.5 Mg Tabs (Lorazepam) .[name]... 1 by mouth two times a day as needed anxiety    Citalopram Hydrobromide 20 Mg Tabs (Citalopram hydrobromide) .[name]... Take one tablet by mouth daily  Complete Medication List: 1)  Ventolin Hfa 108 (90 Base) Mcg/act Aers (Albuterol sulfate) .... 2 puffs q 4 hours prn 2)  Hydrochlorothiazide 25 Mg Tabs (Hydrochlorothiazide) .... Take 1 tab by mouth every morning 3)  Ativan 0.5 Mg Tabs (Lorazepam) .[name].. 1 by mouth two times a day as needed anxiety 4)  Citalopram Hydrobromide 20 Mg Tabs (Citalopram hydrobromide) .... Take one tablet by mouth daily  Patient Instructions: 1)  follow-up 3 months Prescriptions: HYDROCHLOROTHIAZIDE 25 MG  TABS (HYDROCHLOROTHIAZIDE) Take 1 tab by mouth every morning  #90 x 3   Entered and Authorized by:   [name] MD   Signed by:   [name] MD on 02/13/2009   Method used:   Electronically to        [facility] [address] (retail)       [address] [place], [place]  [address]       Ph: [phone]       Fax: [phone]   RxID:   [id number]   Current Allergies (reviewed today): ! CODEINE * SULFA (SULFONAMIDES) GROUP * PAROXETINE ANALOGUES GROUP PERCOCET (OXYCODONE-ACETAMINOPHEN)

## 2010-10-21 NOTE — Progress Notes (Signed)
Summary: refill request for lorazepam  Phone Note Refill Request Message from:  Fax from Pharmacy  Refills Requested: Medication #1:  ATIVAN 0.5 MG  TABS 1 by mouth two times a day as needed anxiety   Last Refilled: 02/03/2009 Faxed request from [facility] , phone [phone]  Initial call taken by: Lowella Petties,  March 05, 2009 12:05 PM  Follow-up for Phone Call        rx sent to pharmacy Follow-up by: Benny Lennert CMA,  March 05, 2009 1:03 PM      Prescriptions: ATIVAN 0.5 MG  TABS (LORAZEPAM) 1 by mouth two times a day as needed anxiety  #60 x 1   Entered and Authorized by:   [name] MD   Signed by:   [name] MD on 03/05/2009   Method used:   Telephoned to ...       [facility] [address]* (retail)       [address] [place], [place]  [address]       Ph: [phone]       Fax: [phone]   RxID:   [id number]

## 2010-10-21 NOTE — Assessment & Plan Note (Signed)
Summary: CLUBBING OF FINGERS,S.O.B./CLE   Vital Signs:  Patient profile:   50 year old female Height:      63.5 inches Weight:      176.75 pounds BMI:     30.93 Temp:     98.4 degrees F oral Pulse rate:   64 / minute Pulse rhythm:   regular BP sitting:   140 / 90  (left arm) Cuff size:   regular  Vitals Entered By: Benny Lennert CMA Duncan Dull) (August 27, 2010 10:13 AM)  History of Present Illness: Chief complaint clubbing of the hands  Dyspnea: occasional dyspnea with exertion. Notable clubbing of her fingertips, and this is been ongoing for the last multiple years.  This is not acute, the patient is not producing any sputum. She is not coughing. She does have a history of tobacco use.  Anxiety, relatively controlled. She does need a refill on her Ativan, which he is using generally every day,, but has been doing so for many many years.  Stable on this regimen.  Allergies: 1)  ! Codeine 2)  * Sulfa (Sulfonamides) Group 3)  * Paroxetine Analogues Group 4)  Percocet (Oxycodone-Acetaminophen)  Past History:  Past Medical History: Allergic rhinitis Anxiety Hypertension Hyperlipidemia  Review of Systems General:  Denies chills and fever. CV:  Complains of shortness of breath with exertion; denies chest pain or discomfort. Resp:  Complains of shortness of breath; denies chest discomfort, chest pain with inspiration, cough, coughing up blood, excessive snoring, and hypersomnolence.  Physical Exam  General:  Well-developed,well-nourished,in no acute distress; alert,appropriate and cooperative throughout examination Head:  Normocephalic and atraumatic without obvious abnormalities. No apparent alopecia or balding. Ears:  no external deformities.   Nose:  no external deformity.   Extremities:  clubbing of fingers extensively Neurologic:  alert & oriented X3 and gait normal.   Cervical Nodes:  No lymphadenopathy noted Psych:  Cognition and judgment appear intact. Alert and  cooperative with normal attention span and concentration. No apparent delusions, illusions, hallucinations   Impression & Recommendations:  Problem # 1:  INTERSTITIAL LUNG DISEASE (ICD-515) Assessment New Chest X-ray, 2 Views, PA and Lateral Indication: sob Findings: There is no evidence for acute infiltrate, prominent interstitial markings  consult pulmonology. Clinical question, does this patient have interstitial lung disease, or any other manifestation that may be causing extensive clubbing of the fingers and changes on chest x-ray. Advice and comanagement appreciated.   Spirometry pre and post albuterol normal on my interpretation in the office.   Problem # 2:  DYSPNEA/SHORTNESS OF BREATH (ICD-786.09) Assessment: New  Her updated medication list for this problem includes:    Hydrochlorothiazide 25 Mg Tabs (Hydrochlorothiazide) .[name]... Take 1 tab by mouth every morning  Orders: Venipuncture (47829) TLB-BMP (Basic Metabolic Panel-BMET) (80048-METABOL) TLB-CBC Platelet - w/Differential (85025-CBCD) TLB-Hepatic/Liver Function Pnl (80076-HEPATIC) TLB-CRP-High Sensitivity (C-Reactive Protein) (86140-FCRP) TLB-Sedimentation Rate (ESR) (85652-ESR) Spirometry (Pre & Post) (94060) T-2 View CXR (71020TC) Pulmonary Referral (Pulmonary)  Problem # 3:  ANXIETY (ICD-300.00) Assessment: Unchanged  Her updated medication list for this problem includes:    Ativan 0.5 Mg Tabs (Lorazepam) .[name]... 1 by mouth two times a day as needed anxiety  Problem # 4:  TOBACCO USE (ICD-305.1)  The patient does smoke cigarettes, and we discussed that tobacco is harmful to one's overall health, and that likely quitting smoking would be the single best thing that they could do for their health.   Orders: Tobacco use cessation intermediate 3-10 minutes (56213)  Complete Medication List: 1)  Hydrochlorothiazide 25 Mg Tabs (Hydrochlorothiazide) .... Take 1 tab by mouth every morning 2)  Ativan 0.5 Mg Tabs  (Lorazepam) .[name].. 1 by mouth two times a day as needed anxiety  Other Orders: Admin 1st Vaccine (02725) Flu Vaccine 45yrs + ([address])  Patient Instructions: 1)  Referral Appointment Information 2)  Day/Date: 3)  Time: 4)  Place/MD: 5)  Address: 6)  Phone/Fax: 7)  Patient given appointment information. Information/Orders faxed/mailed.  Prescriptions: ATIVAN 0.5 MG  TABS (LORAZEPAM) 1 by mouth two times a day as needed anxiety  #60 x 5   Entered and Authorized by:   [name] MD   Signed by:   [name] MD on 08/27/2010   Method used:   Print then Give to Patient   RxID:   [id number] ATIVAN 0.5 MG  TABS (LORAZEPAM) 1 by mouth two times a day as needed anxiety  #60 x 5   Entered and Authorized by:   [name] MD   Signed by:   [name] MD on 08/27/2010   Method used:   Telephoned to ...       [facility] [address]* (retail)       [address] [place], [place]  [address]       Ph: [phone]       Fax: [phone]   RxID:   [id number]    Orders Added: 1)  Admin 1st Vaccine [90471] 2)  Flu Vaccine 62yrs + [76283] 3)  Venipuncture [15176] 4)  TLB-BMP (Basic Metabolic Panel-BMET) [80048-METABOL] 5)  TLB-CBC Platelet - w/Differential [85025-CBCD] 6)  TLB-Hepatic/Liver Function Pnl [80076-HEPATIC] 7)  TLB-CRP-High Sensitivity (C-Reactive Protein) [86140-FCRP] 8)  TLB-Sedimentation Rate (ESR) [85652-ESR] 9)  Spirometry (Pre & Post) [94060] 10)  T-2 View CXR [71020TC] 11)  Pulmonary Referral [Pulmonary] 12)  Est. Patient Level IV [16[address]] 13)  Tobacco use cessation intermediate 3-10 minutes [99406]    Current Allergies (reviewed today): ! CODEINE * SULFA (SULFONAMIDES) GROUP * PAROXETINE ANALOGUES GROUP PERCOCET (OXYCODONE-ACETAMINOPHEN)                     Flu Vaccine Consent Questions     Do you have a history of severe allergic reactions to this vaccine? no    Any prior history  of allergic reactions to egg and/or gelatin? no    Do you have a sensitivity to the preservative Thimersol? no    Do you have a past history of Guillan-Barre Syndrome? no    Do you currently have an acute febrile illness? no    Have you ever had a severe reaction to latex? no    Vaccine information given and explained to patient? yes    Are you currently pregnant? no    Lot Number:[id number]   Exp Date:03/20/2011   Site Given  Left Deltoid [id number]

## 2010-10-21 NOTE — Assessment & Plan Note (Signed)
Summary: BRONCHITIS/CLE   Vital Signs:  Patient Profile:   50 Years Old Female Weight:      176 pounds Temp:     97.9 degrees F oral Pulse rate:   80 / minute Pulse rhythm:   regular BP sitting:   140 / 98  (right arm)  Vitals Entered By: Lowella Petties (May 29, 2008 9:32 AM)                 Chief Complaint:  Follow up with bronchitis and was seen at urgent care.  Acute Visit History:      The patient complains of cough and musculoskeletal symptoms.  She denies abdominal pain, chest pain, constipation, diarrhea, earache, eye symptoms, fever, genitourinary symptoms, headache, nasal discharge, nausea, rash, sinus problems, sore throat, and vomiting.  Other comments include: Cough friday all day, felt chest congestion and wheezing later.  Walk-in clinic on Saturday-[facility] Was given Augmentin and cough medicine Wheezing currently. Sleept OK last night.  green putum production.  h/o tobacco abuse Temperative 99.2 max. muscle aches going away.    .        The patient notes wheezing.  The cough interferes with her sleep.  The character of the cough is described as productive.  She has no history of COPD.  There is no history of shortness of breath, respiratory retractions, tachypnea, cyanosis, or interference with oral intake associated with her cough.           Current Allergies: ! 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       Current Smoker       Alcohol use-no    Review of Systems      See HPI  General       Complains of fatigue.      Denies chills, fever, loss of appetite, malaise, sweats, and weakness.  ENT      Complains of sore throat.      Denies difficulty swallowing, ear discharge, earache, hoarseness, nosebleeds, postnasal drainage, and sinus pressure.  Resp      Complains of cough, sputum productive, and wheezing.      Denies pleuritic and shortness of breath.  GI      Denies abdominal pain, diarrhea, nausea, and vomiting.  GU      Denies dysuria, hematuria, nocturia, urinary frequency, and urinary hesitancy.  MS      resolving myalgia  Derm      Denies rash.   Physical Exam  General:     Well-developed,well-nourished,in no acute distress; alert,appropriate and cooperative throughout examination Head:     Normocephalic and atraumatic without obvious abnormalities. No apparent alopecia or balding. Ears:     External ear exam shows no significant lesions or deformities.  Otoscopic examination reveals clear canals, tympanic membranes  are intact bilaterally without bulging, retraction, inflammation or discharge. Hearing is grossly normal bilaterally. Mouth:     Oral mucosa and oropharynx without lesions or exudates.  Teeth in good repair. Lungs:     normal respiratory effort, no intercostal retractions, and no crackles.   diffuse mild wheezes B Heart:     Normal rate and regular rhythm. S1 and S2 normal without gallop, murmur, click, rub or other extra sounds. Abdomen:     Bowel sounds positive,abdomen soft and non-tender without masses, organomegaly or hernias noted.    Impression & Recommendations:  Problem # 1:  BRONCHITIS-ACUTE (ICD-466.0) Assessment: New Please see the patient instructions for a detailed list of plans and what was discussed with the patient.  Cont Augmentin, already rx by [facility]  Her updated medication list for this problem includes:    Ventolin Hfa 108 (90 Base) Mcg/act Aers (Albuterol sulfate) .[name]... 2 puffs q 4 hours prn    Complete Medication List: 1)  Lorazepam 0.5 Mg Tabs (Lorazepam) .... Take 1 tablet by mouth twice a day 2)  Ventolin Hfa 108 (90 Base) Mcg/act Aers (Albuterol sulfate) .... 2 puffs q 4 hours prn   Patient Instructions: 1)  Most important thing is to drink plenty of fluids and get plenty of sleep. 2)  Cold medicines - Dayquil, Theraflu, Dimetapp, Robitussin DM: any of them work 3)  Halls or other cough drop 4)  Chloraseptic over the counter spray as needed for sore throat 5)  f/u if not better 7-10 days   Prescriptions: VENTOLIN HFA 108 (90 BASE) MCG/ACT AERS (ALBUTEROL SULFATE) 2 puffs q 4 hours prn  #1 x 2   Entered and Authorized by:   [name] MD   Signed by:   [name] MD on 05/29/2008   Method used:   Electronically to        [facility] [address] (retail)       [address] [place], [place]  [address]       Ph: [phone]       Fax: [phone]   RxID:   [id number]  ]

## 2010-10-21 NOTE — Progress Notes (Signed)
Summary: ativan  Phone Note Refill Request Message from:  Fax from Pharmacy on June 04, 2009 1:17 PM  Refills Requested: Medication #1:  ATIVAN 0.5 MG  TABS 1 by mouth two times a day as needed anxiety   Supply Requested: 1 month [facility] [phone]   Method Requested: Telephone to Pharmacy Initial call taken by: Benny Lennert CMA (AAMA),  June 04, 2009 1:18 PM  Follow-up for Phone Call        rx cakked in Follow-up by: Benny Lennert CMA (AAMA),  June 04, 2009 2:23 PM    Prescriptions: ATIVAN 0.5 MG  TABS (LORAZEPAM) 1 by mouth two times a day as needed anxiety  #60 x 1   Entered and Authorized by:   [name] MD   Signed by:   [name] MD on 06/04/2009   Method used:   Telephoned to ...       [facility] [address] (retail)       [address] [place], [place]  [address]       Ph: [phone]       Fax: [phone]   RxID:   [id number]

## 2010-10-21 NOTE — Progress Notes (Signed)
Summary: rx lorazopam  Phone Note Refill Request Message from:  Fax from Pharmacy on June 14, 2008 10:05 AM  Refills Requested: Medication #1:  LORAZEPAM 0.5 MG TABS Take 1 tablet by mouth twice a day   Last Refilled: 05/16/2008 [facility] [place]: [phone]  Initial call taken by: Providence Crosby,  June 14, 2008 10:05 AM  Follow-up for Phone Call        okay #60 x 0 Follow-up by: Cindee Salt MD,  June 14, 2008 11:02 AM  Additional Follow-up for Phone Call Additional follow up Details #1::        prescribtion faxed to pharmacy Additional Follow-up by: Providence Crosby,  June 14, 2008 11:46 AM

## 2010-10-21 NOTE — Assessment & Plan Note (Signed)
Summary: F/U/CLE   Vital Signs:  Patient profile:   50 year old female Height:      63.5 inches Weight:      178.2 pounds BMI:     31.18 Temp:     97.6 degrees F oral Pulse rate:   80 / minute Pulse rhythm:   regular BP sitting:   120 / 78  (left arm) Cuff size:   regular  Vitals Entered By: Benny Lennert CMA [facility] Dull) (June 18, 2009 3:29 PM)  History of Present Illness: Chief complaint followup bp  HTN: stable, no problems with medication  anxiety: overall, the patient is doing much better. She is taking Ativan 0.5 mg twice daily. She is not been able to tolerate any antidepressants, and she is self discontinued all these. daughter now back out of house  stopped the celexa, felt like her thinking was not clear    Allergies: 1)  ! Codeine 2)  * Sulfa (Sulfonamides) Group 3)  * Paroxetine Analogues Group 4)  Percocet (Oxycodone-Acetaminophen)  Past History:  Past medical, surgical, family and social histories (including risk factors) reviewed, and no changes noted (except as noted below).  Past Medical History: Reviewed history from 06/21/2007 and no changes required. Allergic rhinitis Anxiety Hypertension Hyperlipidemia  CONSULTANTS Dr Limmie Patricia  Past Surgical History: Reviewed history from 06/05/2007 and no changes required. C-section x 1 1987 Tubal ligation 1989 Cervical Cryotherapy 1980's  Family History: Reviewed history from 06/21/2007 and no changes required. Mom with anxiety and HTN Dad with chronic back problems 1 sister Mat GF had CHF.  Mat great uncles may have had early CAD Mat great aunt with breast cancer No colon cancer    Social History: Reviewed history from 07/04/2008 and no changes required. Marital Status: Married Children: 1 Occupation: Designer, industrial/product Asst. [employer] Current Smoker Alcohol use-no Drug use-no Regular exercise-no  Review of Systems       REVIEW OF SYSTEMS GEN: No acute illnesses, no fever, chills,  sweats. CV: No chest pain or SOB GI: No noted N or V Otherwise, pertinent positives and negatives are noted in the HPI or as noted.   Physical Exam  Additional Exam:  GEN: WDWN, NAD, Non-toxic, A & O x 3 HEENT: Atraumatic, Normocephalic. Neck supple. No masses, No LAD. Ears and Nose: No external deformity. CV: RRR, No M/G/R. No JVD. No thrill. No extra heart sounds. PULM: CTA B, no wheezes, crackles, rhonchi. No retractions. No resp. distress. No accessory muscle use. EXTR: No c/c/e NEURO: Normal gait.  PSYCH: Normally interactive. Conversant. Not depressed or anxious appearing.  Calm demeanor.     Impression & Recommendations:  Problem # 1:  HYPERTENSION (ICD-401.9) Assessment Improved stable  Her updated medication list for this problem includes:    Hydrochlorothiazide 25 Mg Tabs (Hydrochlorothiazide) .[name]... Take 1 tab by mouth every morning  Problem # 2:  ANXIETY (ICD-300.00) Assessment: Improved we're going to attempt to wean off of Ativan, using the following taper: half a tablet in the morning, and one tablet at night, continue this for 3 weeks.  Then  to half a tablet twice a day. Following this, the patient will take one quarter of the tablet in the morning and half a tablet at night, then discontinue to one quarter of a tablet twice a day. If she is able to tolerate all this, she will then go to only one quarter of the tablet at night, then discontinue.  All these will be done at three-week intervals.  Her  updated medication list for this problem includes:    Ativan 0.5 Mg Tabs (Lorazepam) .[name]... 1 by mouth two times a day as needed anxiety    Citalopram Hydrobromide 20 Mg Tabs (Citalopram hydrobromide) .[name]... Take one tablet by mouth daily  Complete Medication List: 1)  Ventolin Hfa 108 (90 Base) Mcg/act Aers (Albuterol sulfate) .... 2 puffs q 4 hours prn 2)  Hydrochlorothiazide 25 Mg Tabs (Hydrochlorothiazide) .... Take 1 tab by mouth every morning 3)  Ativan 0.5 Mg  Tabs (Lorazepam) .[name].. 1 by mouth two times a day as needed anxiety 4)  Citalopram Hydrobromide 20 Mg Tabs (Citalopram hydrobromide) .... Take one tablet by mouth daily  Other Orders: Admin 1st Vaccine (16109) Flu Vaccine 100yrs + ([address])  Patient Instructions: 1)  Please make a future lab appointment: Stop at the front desk on your way out to set it up. You can come in that day directly and will not have to see the doctor. 2)  future fasting lipid panel 3)  v77.91  4)  BMP: v77.1 5)  Hepatic Function Panel: v58.69 6)  CBC with diff: 780.79 7)  TSH: 780.79  8)  f/u 6 months  Current Allergies (reviewed today): ! CODEINE * SULFA (SULFONAMIDES) GROUP * PAROXETINE ANALOGUES GROUP PERCOCET (OXYCODONE-ACETAMINOPHEN) ALOGUES GROUP PERCOCET (OXYCODONE-ACETAMINOPHEN)    Flu Vaccine Consent Questions     Do you have a history of severe allergic reactions to this vaccine? no    Any prior history of allergic reactions to egg and/or gelatin? no    Do you have a sensitivity to the preservative Thimersol? no    Do you have a past history of Guillan-Barre Syndrome? no    Do you currently have an acute febrile illness? no    Have you ever had a severe reaction to latex? no    Vaccine information given and explained to patient? yes    Are you currently pregnant? no    Lot Number:[id number]   Exp Date:03/19/2010   Site Given  Left Deltoid IM

## 2010-10-21 NOTE — Progress Notes (Signed)
Summary: Lorazepam  Phone Note Refill Request Message from:  Fax from Pharmacy on Feb 09, 2010 3:16 PM  Refills Requested: Medication #1:  ATIVAN 0.5 MG  TABS 1 by mouth two times a day as needed anxiety. [facility], [place]  Phone:   [phone]   Method Requested: Telephone to Pharmacy Initial call taken by: Delilah Shan CMA [facility] Dull),  Feb 09, 2010 3:17 PM  Follow-up for Phone Call        refilled 12/2009 - check on this. has refills Follow-up by: [name] MD,  Feb 09, 2010 4:08 PM  Additional Follow-up for Phone Call Additional follow up Details #1::        pharamacist advised but, they have no furthur refills on file  Additional Follow-up by: Benny Lennert CMA [facility] Dull),  Feb 10, 2010 9:26 AM    Additional Follow-up for Phone Call Additional follow up Details #2::    OK, reviewed, #3 refills history reviewed Follow-up by: [name] MD,  Feb 10, 2010 10:28 AM  Additional Follow-up for Phone Call Additional follow up Details #3:: Details for Additional Follow-up Action Taken: pharmacy said that she did look up rx and one had no refills and the other did have 5 so patient must have entered wrong refill number in system and the automatically sent request over   noted. [name] MD  Feb 11, 2010 8:36 AM  Additional Follow-up by: Benny Lennert CMA [facility] Dull),  Feb 10, 2010 4:08 PM

## 2010-10-21 NOTE — Progress Notes (Signed)
Summary: lorazepam refill  Phone Note Refill Request Call back at [phone] Message from:  Pharmacy on May 30, 2007 4:08 PM  Refills Requested: Medication #1:  LORAZEPAM 0.5 MG TABS Take 1 tablet by mouth twice a day last got # 60 on 04/28/07  Initial call taken by: Liane Comber,  May 30, 2007 4:08 PM  Follow-up for Phone Call        okay #60 x 0 Follow-up by: Cindee Salt MD,  May 31, 2007 7:51 AM  Additional Follow-up for Phone Call Additional follow up Details #1::        Rx called to pharmacy Additional Follow-up by: Liane Comber,  May 31, 2007 8:09 AM

## 2010-10-21 NOTE — Progress Notes (Signed)
Summary: Rx Lorazepam  Phone Note Call from Patient Call back at [phone]   Caller: Patient Call For: Dr. Patsy Lager Summary of Call: Calling about a medication that you gave her at her last visit.  Pt states that she can not take Clonazapam it gives her nightmares and she does not like it.  Pt would like refills on her Lorazpam.  Pharmacy/[facility].  Call pt and let her know that you have called this refill in for her. Initial call taken by: Sydell Axon,  July 15, 2008 1:58 PM  Follow-up for Phone Call        Thurston Hole, please call her and call in pharmacy  d/c Klonopin  OK to telephone in Ativan prescription as written Follow-up by: [name] MD,  July 15, 2008 2:16 PM  Additional Follow-up for Phone Call Additional follow up Details #1::        med called in Additional Follow-up by: Silas Sacramento,  July 15, 2008 3:12 PM    New/Updated Medications: ATIVAN 0.5 MG  TABS (LORAZEPAM) 1 by mouth two times a day as needed anxiety   Prescriptions: ATIVAN 0.5 MG  TABS (LORAZEPAM) 1 by mouth two times a day as needed anxiety  #60 x 0   Entered and Authorized by:   [name] MD   Signed by:   [name] MD on 07/15/2008   Method used:   Telephoned to ...         RxID:   [id number]  to [facility]

## 2010-10-21 NOTE — Progress Notes (Signed)
Summary: ativan  Phone Note Refill Request Message from:  Fax from Pharmacy on April 29, 2009 7:45 AM  Refills Requested: Medication #1:  ATIVAN 0.5 MG  TABS 1 by mouth two times a day as needed anxiety   Supply Requested: [address] [phone]   Method Requested: Telephone to Pharmacy Initial call taken by: Benny Lennert CMA,  April 29, 2009 7:46 AM  Follow-up for Phone Call        rx called into pharmacy Follow-up by: Benny Lennert CMA,  April 29, 2009 8:10 AM    Prescriptions: ATIVAN 0.5 MG  TABS (LORAZEPAM) 1 by mouth two times a day as needed anxiety  #60 x 1   Entered and Authorized by:   [name] MD   Signed by:   [name] MD on 04/29/2009   Method used:   Telephoned to ...       [facility] [address]* (retail)       [address] [place], [place]  [address]       Ph: [phone]       Fax: [phone]   RxID:   [id number]

## 2010-10-21 NOTE — Assessment & Plan Note (Signed)
Summary: ROA 1 MTH//CYD   Vital Signs:  Patient Profile:   50 Years Old Female Height:     63.5 inches (161.29 cm) Weight:      179 pounds (81.36 kg) Temp:     97.8 degrees F (36.56 degrees C) oral Pulse rate:   92 / minute Pulse rhythm:   regular BP sitting:   130 / 90  (left arm) Cuff size:   regular  Vitals Entered By: Silas Sacramento (August 28, 2008 11:57 AM)                 Chief Complaint:  1 mo f/u.  History of Present Illness: The patient is here today to follow-up on several issues:  1. HTN: 130/90 today. Feeling OK. Was worried about ACE cough so did not start lisinopril portion - and just kept on taking HCTZ. Has had no problems with that.   2. Tob: wants to quit smoking. Going to try hypnosis.   Next week appt. with Dr. Luella Cook    Current Allergies: ! CODEINE * SULFA (SULFONAMIDES) GROUP * PAROXETINE ANALOGUES GROUP PERCOCET (OXYCODONE-ACETAMINOPHEN)  Past Medical History:    Reviewed history from 06/21/2007 and no changes required:       Allergic rhinitis       Anxiety       Hypertension       Hyperlipidemia              CONSULTANTS       Dr Limmie Patricia            Review of Systems       REVIEW OF SYSTEMS GEN: No acute illnesses, no fever, chills, sweats.  CV: No chest pain or SOB  GI: No noted N or V  Otherwise, pertinent positives and negatives are noted in the HPI. Additional ROS may be included in the Centricity ROS section, but if not, then this constitutes the ROS.    Physical Exam  GEN: WDWN, NAD, Non-toxic, A & O x 3  HEENT: Atraumatic, Normocephalic. Neck supple. No masses, No LAD.  Ears and Nose: No external deformity.  CV: RRR, No M/G/R. No JVD. No thrill. No extra heart sounds.  Pulm: CTA B, no wheezes, crackles, rhonchi. No retractions. No resp. distress. No accessory muscle use.  Neuro: Normal gait.   Psych: Normally interactive. Conversant. Not depressed or anxious appearing.  Calm demeanor.       Impression & Recommendations:  Problem # 1:  HYPERTENSION (ICD-401.9) Assessment: Improved stable relatively, diastolic at 90. Wants to try diet and exercise before increasing  Her updated medication list for this problem includes:    Hydrochlorothiazide 12.5 Mg Tabs (Hydrochlorothiazide) .[name]... Take 1 tab  by mouth every morning  Orders: No RX's Sent Electronically/Printed (Z6109)   Problem # 2:  TOBACCO USE (ICD-305.1) Assessment: New The patient does smoke cigarettes, and we discussed that tobacco is harmful to one's overall health, and that likely quitting smoking would be the single best thing that they could do for their health.  Orders: Tobacco use cessation intermediate 3-10 minutes (99406)   Complete Medication List: 1)  Ventolin Hfa 108 (90 Base) Mcg/act Aers (Albuterol sulfate) .... 2 puffs q 4 hours prn 2)  Hydrochlorothiazide 12.5 Mg Tabs (Hydrochlorothiazide) .... Take 1 tab  by mouth every morning 3)  Ativan 0.5 Mg Tabs (Lorazepam) .[name].. 1 by mouth two times a day as needed anxiety    Prescriptions: HYDROCHLOROTHIAZIDE 12.5 MG  TABS (HYDROCHLOROTHIAZIDE) Take 1 tab  by mouth every morning  #90 x 3   Entered and Authorized by:   [name] MD   Signed by:   [name] MD on 08/28/2008   Method used:   Historical   RxID:   [id number]  ]

## 2010-10-21 NOTE — Assessment & Plan Note (Signed)
Summary: 1 MONTH F/U FOR BLOOD PRESUURE CHECK/SD   Vital Signs:  Patient Profile:   50 Years Old Female Height:     63.5 inches (161.29 cm) Weight:      175 pounds (79.55 kg) Temp:     98.0 degrees F (36.67 degrees C) oral Pulse rate:   88 / minute Pulse rhythm:   regular BP sitting:   138 / 108  (left arm) Cuff size:   regular  Vitals Entered By: Silas Sacramento (July 29, 2008 9:47 AM)                 Chief Complaint:  1 month f/u.  History of Present Illness: 50 year old white female who presents for a follow-up visit on blood pressure  HTN: 138/108 today, recently started HCTZ 12.5 mg, has not had any difficulties, actually feels subjectively less puffy. My recheck 145/105 on BP      Current Allergies: ! CODEINE * SULFA (SULFONAMIDES) GROUP * PAROXETINE ANALOGUES GROUP PERCOCET (OXYCODONE-ACETAMINOPHEN)  Past Medical History:    Reviewed history from 06/21/2007 and no changes required:       Allergic rhinitis       Anxiety       Hypertension       Hyperlipidemia              CONSULTANTS       Dr Limmie Patricia            Review of Systems       REVIEW OF SYSTEMS GEN: No acute illnesses, no fever, chills, sweats.  CV: No chest pain or SOB  GI: No noted N or V  Otherwise, pertinent positives and negatives are noted in the HPI. Additional ROS may be included in the Centricity ROS section, but if not, then this constitutes the ROS.    Physical Exam  GEN: WDWN, NAD, Non-toxic, A & O x 3  HEENT: Atraumatic, Normocephalic. Neck supple. No masses, No LAD.  Ears and Nose: No external deformity.  CV: RRR, No M/G/R. No JVD. No thrill. No extra heart sounds.  Pulm: CTA B, no wheezes, crackles, rhonchi. No retractions. No resp. distress. No accessory muscle use.  Extr: No c/c/e  Neuro: Normal gait.   Psych: Normally interactive. Conversant. Not depressed or anxious appearing.  Calm demeanor.      Impression & Recommendations:  Problem # 1:   HYPERTENSION (ICD-401.9) Assessment: New Increase BP meds  Please see the patient instructions for a detailed list of plans and what was discussed with the patient.   Her updated medication list for this problem includes:    Lisinopril-hydrochlorothiazide 10-12.5 Mg Tabs (Lisinopril-hydrochlorothiazide) .[name]... 1 by mouth daily  Orders: All RX's Sent Electronically [phone])   Complete Medication List: 1)  Ventolin Hfa 108 (90 Base) Mcg/act Aers (Albuterol sulfate) .... 2 puffs q 4 hours prn 2)  Lisinopril-hydrochlorothiazide 10-12.5 Mg Tabs (Lisinopril-hydrochlorothiazide) .[name].. 1 by mouth daily 3)  Ativan 0.5 Mg Tabs (Lorazepam) .[name].. 1 by mouth two times a day as needed anxiety   Patient Instructions: 1)  Check blood pressure about once a week, write down numbers, then come back in 4-6 weeks   Prescriptions: LISINOPRIL-HYDROCHLOROTHIAZIDE 10-12.5 MG TABS (LISINOPRIL-HYDROCHLOROTHIAZIDE) 1 by mouth daily  #30 x 5   Entered and Authorized by:   [name] MD   Signed by:   [name] MD on 07/29/2008   Method used:   Electronically to        [facility] (retail)       [address] [place], [place]  [address]       Ph: [phone]       Fax: [phone]   RxID:   [id number]  ]

## 2010-10-21 NOTE — Progress Notes (Signed)
Summary: refill request for lorazepam  Phone Note Refill Request Message from:  Fax from Pharmacy  Refills Requested: Medication #1:  ATIVAN 0.5 MG  TABS 1 by mouth two times a day as needed anxiety   Last Refilled: 11/03/2009 Faxed request from [facility], phone [phone].  Initial call taken by: Lowella Petties CMA,  December 03, 2009 12:49 PM  Follow-up for Phone Call        Hold refill for [place] review.  Follow-up by: Kerby Nora MD,  December 03, 2009 2:07 PM  Additional Follow-up for Phone Call Additional follow up Details #1::        Rx called to pharmacy Additional Follow-up by: Benny Lennert CMA [facility] Dull),  December 04, 2009 8:30 AM    Prescriptions: ATIVAN 0.5 MG  TABS (LORAZEPAM) 1 by mouth two times a day as needed anxiety  #60 x 1   Entered and Authorized by:   [name] MD   Signed by:   [name] MD on 12/03/2009   Method used:   Telephoned to ...       [facility] [address]* (retail)       [address] [place], [place]  [address]       Ph: [phone]       Fax: [phone]   RxID:   [id number]

## 2010-10-21 NOTE — Progress Notes (Signed)
Summary: lorazepam   Phone Note Refill Request Message from:  Fax from Pharmacy on July 14, 2010 8:58 AM  Refills Requested: Medication #1:  ATIVAN 0.5 MG  TABS 1 by mouth two times a day as needed anxiety.   Last Refilled: 06/07/2010 Refill request from [facility] [address]  [phone]  Initial call taken by: Melody Comas,  July 14, 2010 9:00 AM  Follow-up for Phone Call        06/10/2010 the patient was given #60 with 5 refills.  This does not make sense. Call patient and pharmacy and ask about that. [name] MD  July 14, 2010 9:11 AM   Additional Follow-up for Phone Call Additional follow up Details #1::        patient has paper copy to turn in.Consuello Masse CMA   Additional Follow-up by: Benny Lennert CMA [facility] Dull),  July 14, 2010 10:09 AM

## 2010-10-21 NOTE — Assessment & Plan Note (Signed)
Summary: 8 M F/U DLO   Vital Signs:  Patient profile:   50 year old female Height:      63.5 inches Weight:      171 pounds BMI:     29.92 Temp:     98.0 degrees F oral Pulse rate:   64 / minute Pulse rhythm:   regular BP sitting:   130 / 88  (left arm) Cuff size:   regular  Vitals Entered By: Linde Gillis CMA Duncan Dull) (December 22, 2009 9:58 AM) CC: six month follow up, Lipid Management   History of Present Illness: 50 year old:  Chol, ref statin reviewed levels  anxiety: doing well, stable on ativan two times a day  we reviewed all labs  HTN stable  Tob, cont intermittent tob  Lipid Management History:      Positive NCEP/ATP III risk factors include current tobacco user and hypertension.  Negative NCEP/ATP III risk factors include female age less than 32 years old, non-diabetic, no ASHD (atherosclerotic heart disease), no prior stroke/TIA, no peripheral vascular disease, and no history of aortic aneurysm.        The patient states that she knows about the "Therapeutic Lifestyle Change" diet.  The patient expresses understanding of adjunctive measures for cholesterol lowering.    Clinical Review Panels:  Prevention   Last Mammogram:  normal (09/29/2008)   Last Pap Smear:  normal (09/29/2008)  Immunizations   Last Tetanus Booster:  Td (05/10/2002)   Last Flu Vaccine:  Fluvax 3+ (06/18/2009)  Lipid Management   Cholesterol:  247 (12/16/2009)   LDL (bad choesterol):  refused (07/04/2008)   HDL (good cholesterol):  40.00 (12/16/2009)  CBC   WBC:  8.9 (12/16/2009)   RBC:  4.39 (12/16/2009)   Hgb:  14.1 (12/16/2009)   Hct:  41.0 (12/16/2009)   Platelets:  225.0 (12/16/2009)   MCV  93.3 (12/16/2009)   MCHC  34.3 (12/16/2009)   RDW  11.7 (12/16/2009)   PMN:  59.0 (12/16/2009)   Lymphs:  32.5 (12/16/2009)   Monos:  6.9 (12/16/2009)   Eosinophils:  1.2 (12/16/2009)   Basophil:  0.4 (12/16/2009)  Complete Metabolic Panel   Glucose:  104 (12/16/2009)   Sodium:   143 (12/16/2009)   Potassium:  3.8 (12/16/2009)   Chloride:  101 (12/16/2009)   CO2:  31 (12/16/2009)   BUN:  11 (12/16/2009)   Creatinine:  0.6 (12/16/2009)   Albumin:  4.3 (12/16/2009)   Total Protein:  7.8 (12/16/2009)   Calcium:  9.7 (12/16/2009)   Total Bili:  0.7 (12/16/2009)   Alk Phos:  53 (12/16/2009)   SGPT (ALT):  15 (12/16/2009)   SGOT (AST):  18 (12/16/2009)   Allergies: 1)  ! Codeine 2)  * Sulfa (Sulfonamides) Group 3)  * Paroxetine Analogues Group 4)  Percocet (Oxycodone-Acetaminophen)  Past History:  Past medical, surgical, family and social histories (including risk factors) reviewed, and no changes noted (except as noted below).  Past Medical History: Reviewed history from 06/21/2007 and no changes required. Allergic rhinitis Anxiety Hypertension Hyperlipidemia  CONSULTANTS Dr Limmie Patricia  Past Surgical History: Reviewed history from 06/05/2007 and no changes required. C-section x 1 1987 Tubal ligation 1989 Cervical Cryotherapy 1980's  Family History: Reviewed history from 06/21/2007 and no changes required. Mom with anxiety and HTN Dad with chronic back problems 1 sister Mat GF had CHF.  Mat great uncles may have had early CAD Mat great aunt with breast cancer No colon cancer    Social History: Reviewed  history from 07/04/2008 and no changes required. Marital Status: Married Children: 1 Occupation: Designer, industrial/product Asst. [employer] Current Smoker Alcohol use-no Drug use-no Regular exercise-no  Review of Systems       ROS: GEN: No acute illnesses, no fevers, chills, sweats, fatigue, weight loss, or URI sx. GI: No n/v/d Pulm: No SOB, cough, wheezing Interactive and getting along well at home.  Otherwise, ROS is as per the HPI.   Physical Exam  Additional Exam:  GEN: WDWN, NAD, Non-toxic, A & O x 3 HEENT: Atraumatic, Normocephalic. Neck supple. No masses, No LAD. Ears and Nose: No external deformity. CV: RRR, No M/G/R. No JVD.  No thrill. No extra heart sounds. PULM: CTA B, no wheezes, crackles, rhonchi. No retractions. No resp. distress. No accessory muscle use. EXTR: No c/c/e NEURO: Normal gait.  PSYCH: Normally interactive. Conversant. Not depressed or anxious appearing.  Calm demeanor.     Contraindications/Deferment of Procedures/Staging:    Test/Procedure: Statin Declined    Reason for deferment: patient declined   Impression & Recommendations:  Problem # 1:  HYPERLIPIDEMIA (ICD-272.4) Assessment New long conversation about importance, rec starting statin, declined  AHA website handouts given  Problem # 2:  HYPERTENSION (ICD-401.9) Assessment: Improved  Her updated medication list for this problem includes:    Hydrochlorothiazide 25 Mg Tabs (Hydrochlorothiazide) .[name]... Take 1 tab by mouth every morning  Problem # 3:  TOBACCO USE (ICD-305.1)  The patient does smoke cigarettes, and we discussed that tobacco is harmful to one's overall health, and that likely quitting smoking would be the single best thing that they could do for their health.   Problem # 4:  ANXIETY (ICD-300.00) Assessment: Improved  Her updated medication list for this problem includes:    Ativan 0.5 Mg Tabs (Lorazepam) .[name]... 1 by mouth two times a day as needed anxiety  Complete Medication List: 1)  Hydrochlorothiazide 25 Mg Tabs (Hydrochlorothiazide) .... Take 1 tab by mouth every morning 2)  Ativan 0.5 Mg Tabs (Lorazepam) .[name].. 1 by mouth two times a day as needed anxiety  Other Orders: Radiology Referral (Radiology)  Lipid Assessment/Plan:      Based on NCEP/ATP III, the patient's risk factor category is "2 or more risk factors and a calculated 10 year CAD risk of < 20%".  From this information, the patient's calculated lipid goals are as follows: Total cholesterol goal is 200; LDL cholesterol goal is 130; HDL cholesterol goal is 40; Triglyceride goal is 150.    Patient Instructions: 1)  Fish oil, 2 grams a day 2)   Read chol diet information 3)  f/u 6 months Prescriptions: HYDROCHLOROTHIAZIDE 25 MG  TABS (HYDROCHLOROTHIAZIDE) Take 1 tab by mouth every morning  #90 x 3   Entered and Authorized by:   [name] MD   Signed by:   [name] MD on 12/22/2009   Method used:   Print then Give to Patient   RxID:   [id number] ATIVAN 0.5 MG  TABS (LORAZEPAM) 1 by mouth two times a day as needed anxiety  #60 x 5   Entered and Authorized by:   [name] MD   Signed by:   [name] MD on 12/22/2009   Method used:   Print then Give to Patient   RxID:   [id number]   Current Allergies (reviewed today): ! CODEINE * SULFA (SULFONAMIDES) GROUP * PAROXETINE ANALOGUES GROUP PERCOCET (OXYCODONE-ACETAMINOPHEN)   Prevention & Chronic Care Immunizations   Influenza vaccine: Fluvax 3+  (06/18/2009)  Influenza vaccine due: 07/04/2009    Tetanus booster: 05/10/2002: Td   Tetanus booster due: 05/10/2012    Pneumococcal vaccine: Not documented  Other Screening   Pap smear: normal  (09/29/2008)   Pap smear due: 09/29/2009    Mammogram: normal  (09/29/2008)   Mammogram action/deferral: Ordered  (12/22/2009)   Mammogram due: 09/29/2009   Smoking status: current  (06/05/2007)  Lipids   Total Cholesterol: 247  (12/16/2009)   LDL: refused  (07/04/2008)   LDL Direct: 173.1  (12/16/2009)   HDL: 40.00  (12/16/2009)   Triglycerides: 270.0  (12/16/2009)    SGOT (AST): 18  (12/16/2009)   SGPT (ALT): 15  (12/16/2009)   Alkaline phosphatase: 53  (12/16/2009)   Total bilirubin: 0.7  (12/16/2009)  Hypertension   Last Blood Pressure: 130 / 88  (12/22/2009)   Serum creatinine: 0.6  (12/16/2009)   Serum potassium 3.8  (12/16/2009)  Self-Management Support :    Hypertension self-management support: Not documented    Lipid self-management support: Not documented    Nursing Instructions: Schedule screening mammogram (see order)

## 2010-10-21 NOTE — Progress Notes (Signed)
Summary: Rx-Loraxepam  Phone Note Refill Request Message from:  Fax from Pharmacy on April 15, 2008 10:46 AM  Refills Requested: Medication #1:  LORAZEPAM 0.5 MG TABS Take 1 tablet by mouth twice a day.   Last Refilled: 03/17/2008 #90 [facility] [phone]   Follow-up for Phone Call        okay #60 x 1 Follow-up by: Cindee Salt MD,  April 15, 2008 1:18 PM  Additional Follow-up for Phone Call Additional follow up Details #1::        faxed to pharmacy Additional Follow-up by: Silas Sacramento,  April 15, 2008 2:41 PM      Prescriptions: LORAZEPAM 0.5 MG TABS (LORAZEPAM) Take 1 tablet by mouth twice a day  #60 x 0   Entered by:   Silas Sacramento   Authorized by:   Cindee Salt MD   Signed by:   Silas Sacramento on 04/15/2008   Method used:   Telephoned to ...       [facility] [address]       [address] [place], [place]  [address]       Ph: [phone]       Fax: [phone]   RxID:   [id number]  faxed to pharnacy

## 2010-10-21 NOTE — Progress Notes (Signed)
Summary: Rx-Lorazepam  Phone Note Refill Request Message from:  Fax from Pharmacy on August 13, 2008 4:31 PM  Refills Requested: Medication #1:  ATIVAN 0.5 MG  TABS 1 by mouth two times a day as needed anxiety.   Last Refilled: 07/15/2008 #60. [facility]  Initial call taken by: Silas Sacramento,  August 13, 2008 4:32 PM  Follow-up for Phone Call        please call in. Follow-up by: [name] MD,  August 13, 2008 5:03 PM  Additional Follow-up for Phone Call Additional follow up Details #1::        called in med Additional Follow-up by: Silas Sacramento,  August 14, 2008 9:36 AM      Prescriptions: ATIVAN 0.5 MG  TABS (LORAZEPAM) 1 by mouth two times a day as needed anxiety  #60 x 2   Entered and Authorized by:   [name] MD   Signed by:   [name] MD on 08/13/2008   Method used:   Telephoned to ...       [facility] [address]* (retail)       [address] [place], [place]  [address]       Ph: [phone]       Fax: [phone]   RxID:   [id number]

## 2010-10-21 NOTE — Progress Notes (Signed)
Summary: refill request for lorazepam  Phone Note Refill Request Message from:  Fax from Pharmacy  Refills Requested: Medication #1:  ATIVAN 0.5 MG  TABS 1 by mouth two times a day as needed anxiety.   Last Refilled: 01/04/2010 Faxed request from [facility] in [place], they want refills on file for next time.  Phone [phone].  Initial call taken by: Lowella Petties CMA,  January 05, 2010 9:42 AM  Follow-up for Phone Call        denied  12/22/2009 given #60, 5 refills Follow-up by: [name] MD,  January 05, 2010 9:51 AM  Additional Follow-up for Phone Call Additional follow up Details #1::        Advised pharmacy. Additional Follow-up by: Lowella Petties CMA,  January 05, 2010 10:32 AM

## 2010-10-21 NOTE — Assessment & Plan Note (Signed)
Summary: cpx/dlo   Vital Signs:  Patient Profile:   50 Years Old Female Height:     63.5 inches (161.29 cm) Weight:      175.13 pounds (79.60 kg) Temp:     97.8 degrees F (36.56 degrees C) oral Pulse rate:   64 / minute Pulse rhythm:   regular BP sitting:   140 / 100  (left arm) Cuff size:   regular  Vitals Entered By: Silas Sacramento (July 04, 2008 8:31 AM)                 Chief Complaint:  Adult physical.  History of Present Illness: 50 year old white female here for a complete physical. she's very pleasant lady, and I had the opportunity to meet her previously when she came in for acute visit during bronchitis. She tells me that she would like to switch to make me her primary care provider.  Patient does smoke, has had a hisotry of smoking 4 longtime  Does hold some fluid. Feels puffy sometimes.   HTN: 140/100 today. 160/105 on my recheck. upon my review of the record, she has been hypertensive every time she has been in this office on police on the computer record. She has never been on any blood pressure medications. She does state that she does have some underlying anxiety, and believes that she has whitecoat hypertension phenomenon. She does have a history of both anxiety and hypertension in her family.  Anxiety, has tried multiple anti-depressants. currently she is fairly well controlled on Ativan twice daily. She has, since she's been on this medication for some time, required an increase in the dose, and does wake up occasionally in the middle of the night.  Flu - wants  Pap smear, [place], she has not had this for greater than one year. Mammogram - age 4 the patient should be getting routine mammograms, however, she does not wish to have one at this point. No colon CA history Declines cholesterol panel    Current Allergies: ! CODEINE * SULFA (SULFONAMIDES) GROUP * PAROXETINE ANALOGUES GROUP PERCOCET (OXYCODONE-ACETAMINOPHEN)  Past Medical History:  Reviewed history from 06/21/2007 and no changes required:       Allergic rhinitis       Anxiety       Hypertension       Hyperlipidemia              CONSULTANTS       Dr Limmie Patricia         Past Surgical History:    Reviewed history from 06/05/2007 and no changes required:       C-section x 1 1987       Tubal ligation 1989       Cervical Cryotherapy 1980's   Family History:    Reviewed history from 06/21/2007 and no changes required:       Mom with anxiety and HTN       Dad with chronic back problems       1 sister       Mat GF had CHF.        Mat great uncles may have had early CAD       Mat great aunt with breast cancer       No colon cancer          Social History:    Reviewed history from 06/05/2007 and no changes required:       Marital Status: Married  Children: 1       Occupation: Administrative Asst. [employer]       Current Smoker       Alcohol use-no       Drug use-no       Regular exercise-no   Risk Factors:  Drug use:  no Exercise:  no  Mammogram History:     Date of Last Mammogram:  07/04/2008  Mammogram History:     Date of Last Mammogram:  07/04/2008    Results:  refused  PAP Smear History:     Date of Last PAP Smear:  07/04/2008  PAP Smear History:     Date of Last PAP Smear:  07/04/2008    Results:  refused   Review of Systems  The patient denies anorexia, fever, weight loss, weight gain, vision loss, decreased hearing, hoarseness, chest pain, syncope, dyspnea on exertion, peripheral edema, abdominal pain, melena, hematochezia, severe indigestion/heartburn, hematuria, incontinence, genital sores, muscle weakness, suspicious skin lesions, difficulty walking, unusual weight change, abnormal bleeding, enlarged lymph nodes, and breast masses.         does complain of some occasional cough  Otherwise, the pertinent positives and negatives are listed above and in the HPI, otherwise a full review of systems has been reviewed and is  negative unless noted positive.   minimal exercise Continues to smoke   Physical Exam  General:     Well-developed,well-nourished,in no acute distress; alert,appropriate and cooperative throughout examination Head:     Normocephalic and atraumatic without obvious abnormalities. No apparent alopecia or balding. Eyes:     vision grossly intact, pupils equal, pupils round, pupils reactive to light, pupils react to accomodation, corneas and lenses clear, and no injection.   Ears:     External ear exam shows no significant lesions or deformities.  Otoscopic examination reveals clear canals, tympanic membranes are intact bilaterally without bulging, retraction, inflammation or discharge. Hearing is grossly normal bilaterally. Nose:     no external deformity.   Mouth:     Oral mucosa and oropharynx without lesions or exudates.  Teeth in good repair. Neck:     No deformities, masses, or tenderness noted. Breasts:     has with Pap Lungs:     Normal respiratory effort, chest expands symmetrically. Lungs are clear to auscultation, no crackles or wheezes. Heart:     Normal rate and regular rhythm. S1 and S2 normal without gallop, murmur, click, rub or other extra sounds. Abdomen:     Bowel sounds positive,abdomen soft and non-tender without masses, organomegaly or hernias noted. Genitalia:     deferred, gets Pap with Dr. Luella Cook Msk:     No deformity or scoliosis noted of thoracic or lumbar spine.   Pulses:     DP, Radial, PT 2+ Extremities:     No clubbing, cyanosis, edema, or deformity noted with normal full range of motion of all joints.   Neurologic:     alert & oriented X3, sensation intact to light touch, gait normal, and DTRs symmetrical and normal.   Skin:     Intact without suspicious lesions or rashes Cervical Nodes:     No lymphadenopathy noted Psych:     Cognition and judgment appear intact. Alert and cooperative with normal attention span and concentration. No apparent  delusions, illusions, hallucinations    Impression & Recommendations:  Problem # 1:  Preventive Health Care (ICD-V70.0) generally, the patient is doing well, however she does continue to smoke, and does have minimal exercise.  I did try to bring up and reinforced healthy living habits.  I tried to educate the patient and offer screening tests that are reasonable and recommended by the [place] preventative service task force. I did tell the patient did decline in these screening tests does put her at risk for disease and potentially for death. She indicated that she understood this and would not like the following services. Declines mammogram, declines lipid screening, the patient is overdue for her Pap smear, would not like this at this point and plans to follow up with Dr. Luella Cook. She is not interested in obtaining any labwork this time period.  Flu Vaccine today  Problem # 2:  HYPERTENSION (ICD-401.9) Assessment: New Patient reluctant to start medicine in past. Meets criteria for hypertension. Would treat, f/u 1-2 months  Her updated medication list for this problem includes:    Hydrochlorothiazide 12.5 Mg Tabs (Hydrochlorothiazide) .[name]... Take 1 tab  by mouth every morning   Problem # 3:  ANXIETY (ICD-300.00) Assessment: Unchanged the patient declined antidepressants at this time.  I do think changing from Ativan to Klonopin may be of benefit given the longer half life.  The following medications were removed from the medication list:    Lorazepam 0.5 Mg Tabs (Lorazepam) .[name]... Take 1 tablet by mouth twice a day  Her updated medication list for this problem includes:    Clonazepam 0.5 Mg Tabs (Clonazepam) .[name]... 1 by mouth bid   Complete Medication List: 1)  Ventolin Hfa 108 (90 Base) Mcg/act Aers (Albuterol sulfate) .... 2 puffs q 4 hours prn 2)  Hydrochlorothiazide 12.5 Mg Tabs (Hydrochlorothiazide) .... Take 1 tab  by mouth every morning 3)  Clonazepam 0.5 Mg Tabs (Clonazepam)  .[name].. 1 by mouth bid  Other Orders: Admin 1st Vaccine ([address]) Flu Vaccine 31yrs + [phone])   Patient Instructions: 1)  follow-up 1 month for blood pressure recheck   Prescriptions: CLONAZEPAM 0.5 MG TABS (CLONAZEPAM) 1 by mouth bid  #60 x 0   Entered and Authorized by:   [name] MD   Signed by:   [name] MD on 07/04/2008   Method used:   Print then Give to Patient   RxID:   [id number] HYDROCHLOROTHIAZIDE 12.5 MG  TABS (HYDROCHLOROTHIAZIDE) Take 1 tab  by mouth every morning  #30 x 5   Entered and Authorized by:   [name] MD   Signed by:   [name] MD on 07/04/2008   Method used:   Print then Give to Patient   RxID:   [id number]  ] Flu Vaccine Result Date:  07/04/2008 Flu Vaccine Result:  given HDL Result Date:  07/04/2008 HDL Result:  refused LDL Result Date:  07/04/2008 LDL Result:  refused PAP Result Date:  07/04/2008 PAP Result:  refused Last Mammogram:  Done (04/20/2004 2:50:40 PM) Mammogram Result Date:  07/04/2008 Mammogram Result:  refused multiple refused, continue to encourage screening at next visits    Influenza Vaccine (to be given today)  Flu Vaccine Consent Questions     Do you have a history of severe allergic reactions to this vaccine? no    Any prior history of allergic reactions to egg and/or gelatin? no    Do you have a sensitivity to the preservative Thimersol? no    Do you have a past history of Guillan-Barre Syndrome? no    Do you currently have an acute febrile illness? no    Have you ever had a severe reaction to latex? no  Vaccine information given and explained to patient? yes    Are you currently pregnant? no    Lot Number:[id number]   Site Given  Left Deltoid IM Iva Boop)

## 2010-10-21 NOTE — Progress Notes (Signed)
Summary: refill request for lorazepam  Phone Note Refill Request Message from:  Fax from Pharmacy  Refills Requested: Medication #1:  ATIVAN 0.5 MG  TABS 1 by mouth two times a day as needed anxiety.   Last Refilled: 12/09/2008 Faxed request from [facility] [place]-- phone [phone]  Initial call taken by: Lowella Petties,  January 08, 2009 9:58 AM  Follow-up for Phone Call        ok call in Follow-up by: [name] MD,  January 08, 2009 9:59 AM  Additional Follow-up for Phone Call Additional follow up Details #1::        PRESCRIBTION CALLED TO PHARMACY LINE Additional Follow-up by: Providence Crosby,  January 08, 2009 11:25 AM      Prescriptions: ATIVAN 0.5 MG  TABS (LORAZEPAM) 1 by mouth two times a day as needed anxiety  #60 x 1   Entered and Authorized by:   [name] MD   Signed by:   [name] MD on 01/08/2009   Method used:   Telephoned to ...       [facility] [address]* (retail)       [address] [place], [place]  [address]       Ph: [phone]       Fax: [phone]   RxID:   [id number]

## 2010-10-21 NOTE — Progress Notes (Signed)
Summary: refill request for lorazepam  Phone Note Refill Request Message from:  Fax from Pharmacy  Refills Requested: Medication #1:  ATIVAN 0.5 MG  TABS 1 by mouth two times a day as needed anxiety.   Dosage confirmed as above?Dosage Confirmed   Brand Name Necessary? No   Supply Requested: 1 month   Last Refilled: 10/10/2008 Faxed form from [facility] is on your desk.  Initial call taken by: Lowella Petties,  November 08, 2008 8:18 AM  Follow-up for Phone Call        ok, call in Follow-up by: [name] MD,  November 08, 2008 8:19 AM  Additional Follow-up for Phone Call Additional follow up Details #1::        Faxed and entered in EMR Additional Follow-up by: Delilah Shan,  November 08, 2008 10:18 AM      Prescriptions: ATIVAN 0.5 MG  TABS (LORAZEPAM) 1 by mouth two times a day as needed anxiety  #60 x 0   Entered by:   Delilah Shan   Authorized by:   [name] MD   Signed by:   Delilah Shan on 11/08/2008   Method used:   Handwritten   RxID:   [id number]

## 2010-10-21 NOTE — Progress Notes (Signed)
  Phone Note Refill Request Message from:  Fax from Pharmacy on July 31, 2009 12:29 PM  Refills Requested: Medication #1:  ATIVAN 0.5 MG  TABS 1 by mouth two times a day as needed anxiety   Supply Requested: 1 month [facility] [phone]   Method Requested: Telephone to Pharmacy Initial call taken by: Benny Lennert CMA Duncan Dull),  July 31, 2009 12:29 PM  Follow-up for Phone Call        called in Follow-up by: Benny Lennert CMA Duncan Dull),  July 31, 2009 4:14 PM    Prescriptions: ATIVAN 0.5 MG  TABS (LORAZEPAM) 1 by mouth two times a day as needed anxiety  #60 x 1   Entered and Authorized by:   [name] MD   Signed by:   [name] MD on 07/31/2009   Method used:   Telephoned to ...       [facility] [address]* (retail)       [address] [place], [place]  [address]       Ph: [phone]       Fax: [phone]   RxID:   [id number]

## 2010-10-22 ENCOUNTER — Telehealth: Payer: Self-pay | Admitting: Pulmonary Disease

## 2010-10-22 NOTE — Assessment & Plan Note (Signed)
Summary: consult for clubbing, abnormal cxr.   Copy to:  Karleen Hampshire Copland Primary Provider/Referring Provider:  [name] MD  CC:  Pulmonary Consult.  History of Present Illness: The pt is a 50y/o female who I have been asked to see for clubbing.  The pt thinks this has occurred over the last one year, and was recently noticed by her dermatologist.  She had a cxr which showed prominent IS markings last month, and had spirometry that was normal as well.  The pt denies any h/o familial clubbing, or FHx of lung disease.  She has no FHx of autoimmune disease.  She leads a sedentary lifestyle, and is unable to tell me how far she can walk before she will get sob.  She will get only mildly winded going up stairs.  She denies getting sob bringing groceries in from the car or vacuuming.  She has a very mild cough with scant mucus that is nonpurulent.  She thinks this is due to smoking or postnasal drip.  She denies any h/o LE edema, occupational exposures, or unusual pets.  A recent sed rate in Dec was normal.    Medications Prior to Update: 1)  Hydrochlorothiazide 25 Mg  Tabs (Hydrochlorothiazide) .... Take 1 Tab By Mouth Every Morning 2)  Ativan 0.5 Mg  Tabs (Lorazepam) .[name].. 1 By Mouth Two Times A Day As Needed Anxiety  Allergies (verified): 1)  ! Codeine 2)  * Sulfa (Sulfonamides) Group 3)  * Paroxetine Analogues Group 4)  Percocet (Oxycodone-Acetaminophen)  Past History:  Past Medical History: Reviewed history from 08/27/2010 and no changes required. Allergic rhinitis Anxiety Hypertension Hyperlipidemia  Past Surgical History: Reviewed history from 06/05/2007 and no changes required. C-section x 1 1987 Tubal ligation 1989 Cervical Cryotherapy 1980's  Family History: Reviewed history from 06/21/2007 and no changes required. Mom with anxiety and HTN Dad with chronic back problems 1 sister Mat GF had CHF.  Mat great uncles may have had early CAD Mat great aunt with breast  cancer No colon cancer    Social History: Reviewed history from 07/04/2008 and no changes required. Marital Status: Married Children: 1 Occupation: housewife Current Smoker.  started at age 59.  37 cigs a day.  Alcohol use-no Drug use-no Regular exercise-no  Review of Systems       The patient complains of anxiety and joint stiffness or pain.  The patient denies shortness of breath with activity, shortness of breath at rest, productive cough, non-productive cough, coughing up blood, chest pain, irregular heartbeats, acid heartburn, indigestion, loss of appetite, weight change, abdominal pain, difficulty swallowing, sore throat, tooth/dental problems, headaches, nasal congestion/difficulty breathing through nose, sneezing, itching, ear ache, depression, hand/feet swelling, rash, change in color of mucus, and fever.    Vital Signs:  Patient profile:   50 year old female Height:      63.5 inches Weight:      181 pounds BMI:     31.67 O2 Sat:      95 % on Room air Temp:     97.5 degrees F oral Pulse rate:   108 / minute BP sitting:   142 / 88  (left arm) Cuff size:   f  Vitals Entered By: Arman Filter LPN (October 06, 2010 9:36 AM)  O2 Flow:  Room air CC: Pulmonary Consult Comments Medications reviewed with patient Arman Filter LPN  October 06, 2010 9:36 AM    Physical Exam  General:  ow female in nad Eyes:  PERRLA and EOMI.  Nose:  patent without discharge, no purulence or discharge seen Mouth:  clear, no lesions or exudates seen. Neck:  no jvd, tmg, LN Lungs:  totally clear to auscultation, no wheezing or crackles seen. Heart:  rrr, no mrg Abdomen:  soft and nontender, bs+ Extremities:  no edema noted, no cyanosis  pulses intact distally +clubbing noted in upper extremitie Neurologic:  alert and oriented, moves all 4.   Impression & Recommendations:  Problem # 1:  OTH NONSPC ABN FINDNG RAD&OTH EXM BODY STRUCTURE (ICD-793.99)  the pt has prominent  bronchovascular markings on cxr vs ISLD.  She has no crackles on exam, has no significant doe (but is sedentary), but does have clubbing.  She has normal spirometry, but has not had lung volumes or DLCO.  She has no FHX of lung disease or autoimmune disease.  At this point, we need to find out if she truly has ISLD or not.  Will check HRCT, and if ISLD seen, will need further workup.  If no parenchymal lung disease, her clubbing is more than likely due to something else?  I have also asked her to quit smoking.  Other Orders: Consultation Level IV [phone]) Radiology Referral (Radiology)  Patient Instructions: 1)  will check high resolution ct scan of chest.  I will call you with results. 2)  If the scan is abnormal, will need to do full breathing tests, further bloodwork, and possibly a biopsy as we discussed. 3)  stop smoking.

## 2010-10-22 NOTE — Progress Notes (Signed)
Summary: returning call  Phone Note Call from Patient Call back at Home Phone [phone]   Caller: Patient Call For: clance Reason for Call: Talk to Nurse Summary of Call: Returning [name]'s call. #[phone] Initial call taken by: Lehman Prom,  October 14, 2010 12:26 PM  Follow-up for Phone Call        Spoke with patient-aware of lab results.Anne Rush CMA  October 14, 2010 2:49 PM

## 2010-10-22 NOTE — Letter (Signed)
Summary: [facility]  [facility]   Imported By: Maryln Gottron 08/31/2010 13:09:05  _____________________________________________________________________  External Attachment:    Type:   Image     Comment:   External Document

## 2010-10-22 NOTE — Assessment & Plan Note (Signed)
Summary: rov to discuss ct results.   Primary Provider/Referring Provider:  Dallas Schimke  CC:  Ov to discuss CT results.  Pt denies any new concerns.  Still smoking 10 cigs a day. and Depression.  History of Present Illness: The pt comes in today for f/u of her recent HRCT.  She was found to have peripheral IS disease and fibrosis that is more focused in upper vs lower lobes.  There were also scattered areas of GG bilaterally.  There were minimally enlarged LN, but not above acceptable criteria.  I have reviewed the ct with her in detail, and answered all of her questions.   Medications Prior to Update: 1)  Hydrochlorothiazide 25 Mg  Tabs (Hydrochlorothiazide) .... Take 1 Tab By Mouth Every Morning 2)  Ativan 0.5 Mg  Tabs (Lorazepam) .[name].. 1 By Mouth Two Times A Day As Needed Anxiety  Allergies (verified): 1)  ! Codeine 2)  * Sulfa (Sulfonamides) Group 3)  * Paroxetine Analogues Group 4)  Percocet (Oxycodone-Acetaminophen)  Review of Systems       The patient complains of productive cough and joint stiffness or pain.  The patient denies shortness of breath with activity, shortness of breath at rest, non-productive cough, coughing up blood, chest pain, irregular heartbeats, acid heartburn, indigestion, loss of appetite, weight change, abdominal pain, difficulty swallowing, sore throat, tooth/dental problems, headaches, nasal congestion/difficulty breathing through nose, sneezing, itching, ear ache, anxiety, depression, hand/feet swelling, rash, change in color of mucus, and fever.    Vital Signs:  Patient profile:   50 year old female Height:      63.5 inches Weight:      182.38 pounds BMI:     31.92 O2 Sat:      98 % on Room air Temp:     97.6 degrees F oral Pulse rate:   128 / minute BP sitting:   148 / 98  (left arm) Cuff size:   regular  Vitals Entered By: Arman Filter LPN (October 12, 2010 9:03 AM)  O2 Flow:  Room air CC: Ov to discuss CT results.  Pt denies any new concerns.   Still smoking 10 cigs a day., Depression Comments Medications reviewed with patient Arman Filter LPN  October 12, 2010 9:06 AM    Physical Exam  General:  ow female in nad Lungs:  fairly clear to auscultation Heart:  rrr Extremities:  no edema or cyanosis Neurologic:  alert and oriented, moves all 4.   Impression & Recommendations:  Problem # 1:  INTERSTITIAL LUNG DISEASE (ICD-515) the pt has definite interstitial process with both fibrotic components and ground glass components.  It is more upper lobe predominate, which would be atypical for IPF or NSIP, but doesn't exclude.  I have explained to the pt this could be anything, and would consider sarcoid, HP, EG, eosinophilic pna, RBILD, IPF, and NSIP.  She really needs a tissue diagnosis.  I have discussed FOB with TBBX vs proceeding directly to VATS.  She understands that some of the diseases require a large piece of tissue for accurate diagnosis, and would have to proceed on to VATS if bronchoscopy did not provide a specific diagnosis.  She will consider whether to start with a less invasive test vs proceeding to VATS to put the issue to rest in one procedure.  Will go ahead and send bloodwork for autoimmune processes, and will need full pfts to establish baseline.  Time spent with pt discussing the above was .  Other Orders: Est. Patient  Level III [phone]) T-Angiotensin i-Converting Enzyme [id number]) T-Antinuclear Antib (ANA) [phone]) T-Rheumatoid Factor [phone]) Pulmonary Referral (Pulmonary) TLB-CBC Platelet - w/Differential (85025-CBCD) TLB-Sedimentation Rate (ESR) (85652-ESR) TLB-PT (Protime) (85610-PTP) TLB-PTT (85730-PTTL)  Patient Instructions: 1)  you need to decide about proceeding with bronchoscopy as an outpatient versus a surgical biopsy.  Please let me know what you think, and call with any questions. 2)  will check bloodwork today 3)  stop smoking 100% 4)  need to schedule for breathing studies  to establish a baseline for you that we can follow.

## 2010-10-27 ENCOUNTER — Telehealth (INDEPENDENT_AMBULATORY_CARE_PROVIDER_SITE_OTHER): Payer: Self-pay | Admitting: *Deleted

## 2010-10-28 NOTE — Miscellaneous (Signed)
Summary: Orders Update pft charges   Clinical Lists Changes  Orders: Added new Service order of Carbon Monoxide diffusing w/capacity (94729) - Signed Added new Service order of Lung Volumes/Gas dilution or washout (94727) - Signed Added new Service order of Spirometry (Pre & Post) (94060) - Signed 

## 2010-10-28 NOTE — Progress Notes (Signed)
Summary: results  Phone Note Call from Patient Call back at Home Phone [phone]   Caller: Patient Call For: Anne Rush Summary of Call: pt wants results of PFT.  Initial call taken by: Tivis Ringer, CNA,  October 22, 2010 1:30 PM  Follow-up for Phone Call        called and spoke with pt.  pt is requesting PFT results.  pt is aware KC out of office this afternoon and will return tomorrow.  KC, the results are in your blue PFT folder. Aundra Millet Reynolds LPN  October 22, 2010 2:46 PM   Additional Follow-up for Phone Call Additional follow up Details #1::        called and left message on machine Additional Follow-up by: Barbaraann Share MD,  October 23, 2010 5:55 PM

## 2010-10-28 NOTE — Progress Notes (Signed)
Summary: FYI  Phone Note Call from Patient Call back at [employer] Phone [phone]   Caller: Patient Call For: [name] Summary of Call: FYI: Pt states she has decided not to do her bronch. Initial call taken by: Darletta Moll,  October 19, 2010 12:50 PM  Follow-up for Phone Call        Spoke with pt and she staets she ahs decidd to not have a bronch done. She did have PFT done today. She states KC told her to think about bronch and let hom know what she decided.  I will forward to [facility] as an FYI. Carron Curie CMA  October 19, 2010 2:50 PM   Additional Follow-up for Phone Call Additional follow up Details #1::        noted.  will call her after pfts read. Additional Follow-up by: Barbaraann Share MD,  October 19, 2010 5:28 PM

## 2010-11-05 NOTE — Progress Notes (Signed)
Summary: PFT results<<<OV scheduled to discuss  Phone Note Call from Patient Call back at Home Phone [phone]   Caller: Patient Reason for Call: Lab or Test Results Summary of Call: Patient wants results of PFT.  Patient says that Dr. Shelle Iron left message on Friday but has not called back. Initial call taken by: Leonette Monarch,  October 27, 2010 10:57 AM  Follow-up for Phone Call        Pt calling back for PFT results.  Please advise. Abigail Miyamoto RN  October 27, 2010 12:12 PM   Additional Follow-up for Phone Call Additional follow up Details #1::        pt really needs to come in and discuss her issues face to face.  She has refused lung biopsy, and I want to make sure she understands the repercussions of her decision and to document in the record. Additional Follow-up by: Barbaraann Share MD,  October 27, 2010 2:21 PM    Additional Follow-up for Phone Call Additional follow up Details #2::    Pt is sch for f/u with [facility] to discuss PFT's on Mon., 11/09/2010 @ 3:30pm.Lori Cooper [facility]  October 27, 2010 3:00 PM

## 2010-11-09 ENCOUNTER — Ambulatory Visit (INDEPENDENT_AMBULATORY_CARE_PROVIDER_SITE_OTHER): Payer: BC Managed Care – PPO | Admitting: Pulmonary Disease

## 2010-11-09 ENCOUNTER — Encounter: Payer: Self-pay | Admitting: Pulmonary Disease

## 2010-11-09 DIAGNOSIS — J8410 Pulmonary fibrosis, unspecified: Secondary | ICD-10-CM

## 2010-11-25 ENCOUNTER — Encounter: Payer: Self-pay | Admitting: Family Medicine

## 2010-11-25 ENCOUNTER — Ambulatory Visit (INDEPENDENT_AMBULATORY_CARE_PROVIDER_SITE_OTHER): Payer: BC Managed Care – PPO | Admitting: Family Medicine

## 2010-11-25 DIAGNOSIS — J8410 Pulmonary fibrosis, unspecified: Secondary | ICD-10-CM

## 2010-11-25 DIAGNOSIS — I10 Essential (primary) hypertension: Secondary | ICD-10-CM

## 2010-11-25 DIAGNOSIS — F411 Generalized anxiety disorder: Secondary | ICD-10-CM

## 2010-11-26 NOTE — Assessment & Plan Note (Signed)
Summary: rov for review of pfts.   Copy to:  Karleen Hampshire Copland Primary Provider/Referring Provider:  Dallas Schimke  CC:  ov to discuss PFT results.  Pt states she is still coughing up brown sputum. [name]  History of Present Illness: the pt comes in today for f/u of her known ISLD.  She has had recent pfts which show no obstruction, no restriction, and a mild decrease in DLCO.  I have reviewed the study with her, and answered all questions.  Have also reviewed again her ct chest with her.  Of note, her autoimmune screening was unremarkable.    Medications Prior to Update: 1)  Hydrochlorothiazide 25 Mg  Tabs (Hydrochlorothiazide) .... Take 1 Tab By Mouth Every Morning 2)  Ativan 0.5 Mg  Tabs (Lorazepam) .[name].. 1 By Mouth Two Times A Day As Needed Anxiety  Allergies (verified): 1)  ! Codeine 2)  * Sulfa (Sulfonamides) Group 3)  * Paroxetine Analogues Group 4)  Percocet (Oxycodone-Acetaminophen)  Review of Systems       The patient complains of productive cough and chest pain.  The patient denies shortness of breath with activity, shortness of breath at rest, non-productive cough, coughing up blood, irregular heartbeats, acid heartburn, indigestion, loss of appetite, weight change, abdominal pain, difficulty swallowing, sore throat, tooth/dental problems, headaches, nasal congestion/difficulty breathing through nose, sneezing, itching, ear ache, anxiety, depression, hand/feet swelling, joint stiffness or pain, rash, change in color of mucus, and fever.    Vital Signs:  Patient profile:   50 year old female Height:      63.5 inches Weight:      182 pounds BMI:     31.85 O2 Sat:      92 % on Room air Temp:     97.6 degrees F oral Pulse rate:   104 / minute BP sitting:   162 / 110  (left arm) Cuff size:   large  Vitals Entered By: Arman Filter LPN (November 09, 2010 3:27 PM)  O2 Flow:  Room air CC: ov to discuss PFT results.  Pt states she is still coughing up brown sputum.  Comments  Medications reviewed with patient Arman Filter LPN  November 09, 2010 3:27 PM    Physical Exam  General:  wd female in nad  Lungs:  clear Heart:  rrr Extremities:  no edema or cyanosis Neurologic:  alert, oriented, moves all 4    Impression & Recommendations:  Problem # 1:  INTERSTITIAL LUNG DISEASE (ICD-515) the pt has ISLD on ct chest of unknown origin.  Her pfts show only a mild decrease in dlco, but no restriction as of yet.  Her autoimmune screen was negative.  This may be a smoking related interstitial process, and therefore have stressed to her again the need to quit smoking.  I have encouraged her to consider either a bronchoscopic or VATS lung biospy to establish a tissue diagnosis,but she does not want to do this.  She understands this may be a progressive disease that may be life threatening in the future.  She is at least willing to followup with me and to have f/u cxr.    Other Orders: Est. Patient Level III (04540)  Patient Instructions: 1)  continue to think about a possible lung biopsy 2)  stop smoking! 3)  followup with me in 6mos, and we will check followup cxr.

## 2010-12-01 NOTE — Assessment & Plan Note (Signed)
Summary: 6 MTH FU/CLE  [employer]   Vital Signs:  Patient profile:   50 year old female Height:      63.5 inches Weight:      179.50 pounds BMI:     31.41 Temp:     98.3 degrees F oral Pulse rate:   76 / minute Pulse rhythm:   regular BP sitting:   140 / 92  (right arm) Cuff size:   regular  Vitals Entered By: Linde Gillis CMA Duncan Dull) (November 25, 2010 3:51 PM) CC: 6 month follow up   Primary Provider/Referring Provider:  Dallas Schimke   History of Present Illness: 50 year old patient f/u on several medical problems:  Interstitial lung disease: the patient has interstitial lung disease, diagnosed on CT scan, without  tissue diagnosis, with exact unknown etiology,  diagnosed by Dr. Shelle Iron. She continues to smoke.she is a little bit frustrated in not having an exact diagnosis.  We discussed again, that is certainly reasonable to consider bronchoscopy  versus  VATS, but she is hesitant.  Hypertension, on my recheck, the patient is normotensive at 130/80.   Anxiety: Stable, occasional Ativan use approximately one tablet every one to 2 days.  Generally used in the evening.   Allergies: 1)  ! Codeine 2)  * Sulfa (Sulfonamides) Group 3)  * Paroxetine Analogues Group 4)  Percocet (Oxycodone-Acetaminophen)  Past History:  Past medical, surgical, family and social histories (including risk factors) reviewed, and no changes noted (except as noted below).  Past Medical History: Interstitial Lung Disease Allergic rhinitis Anxiety Hypertension Hyperlipidemia  Past Surgical History: Reviewed history from 06/05/2007 and no changes required. C-section x 1 1987 Tubal ligation 1989 Cervical Cryotherapy 1980's  Family History: Reviewed history from 06/21/2007 and no changes required. Mom with anxiety and HTN Dad with chronic back problems 1 sister Mat GF had CHF.  Mat great uncles may have had early CAD Mat great aunt with breast cancer No colon cancer    Social History: Reviewed  history from 10/06/2010 and no changes required. Marital Status: Married Children: 1 Occupation: housewife Current Smoker.  started at age 38.  59 cigs a day.  Alcohol use-no Drug use-no Regular exercise-no  Review of Systems       as above, rare anxiety. no depression. no cp. generally feeling well.  Physical Exam  General:  Well-developed,well-nourished,in no acute distress; alert,appropriate and cooperative throughout examination Head:  Normocephalic and atraumatic without obvious abnormalities. No apparent alopecia or balding. Ears:  no external deformities.   Nose:  no external deformity.   Neck:  No deformities, masses, or tenderness noted. Lungs:  Normal respiratory effort, chest expands symmetrically. Lungs are clear to auscultation, no crackles or wheezes. Heart:  Normal rate and regular rhythm. S1 and S2 normal without gallop, murmur, click, rub or other extra sounds. Extremities:  clubbing of fingers extensively Neurologic:  gait normal.   Cervical Nodes:  No lymphadenopathy noted Psych:  Cognition and judgment appear intact. Alert and cooperative with normal attention span and concentration. No apparent delusions, illusions, hallucinations   Impression & Recommendations:  Problem # 1:  INTERSTITIAL LUNG DISEASE (ICD-515) The patient asks about a second opinion regarding her lung condition, and I think that is reasonable. We discussed that I think Dr. Shelle Iron is an outstanding pulmonologist and that she needs to keep seeing him, and I would like his help in her management. I think it is always reasonable to get a second opinion, we discussed different options, and I am  going to ask Dr. Sandy Salaam at [facility] to see if he would not mind evaluating the patient and giving his opinion given his longstanding experience.  Orders: Pulmonary Referral (Pulmonary)  Problem # 2:  HYPERTENSION (ICD-401.9) Assessment: Unchanged stable  Her updated medication list for this problem  includes:    Hydrochlorothiazide 25 Mg Tabs (Hydrochlorothiazide) .[name]... Take 1 tab by mouth every morning  Problem # 3:  ANXIETY (ICD-300.00) Assessment: Unchanged stable  Her updated medication list for this problem includes:    Ativan 0.5 Mg Tabs (Lorazepam) .[name]... 1 by mouth two times a day as needed anxiety  Problem # 4:  HYPERLIPIDEMIA (ICD-272.4) persistently resistant to treatment.  Labs Reviewed: SGOT: 21 (08/27/2010)   SGPT: 17 (08/27/2010)  Lipid Goals: Chol Goal: 200 (12/22/2009)   HDL Goal: 40 (12/22/2009)   LDL Goal: 130 (12/22/2009)   TG Goal: 150 (12/22/2009)  Prior 10 Yr Risk Heart Disease: Not enough information (12/22/2009)   HDL:40.00 (12/16/2009), 42.00 (12/16/2009)  ION:GEXBMWU (07/04/2008)  XLKG:401 (12/16/2009), 241 (12/16/2009)  Trig:270.0 (12/16/2009), 267.0 (12/16/2009)  Complete Medication List: 1)  Hydrochlorothiazide 25 Mg Tabs (Hydrochlorothiazide) .... Take 1 tab by mouth every morning 2)  Ativan 0.5 Mg Tabs (Lorazepam) .[name].. 1 by mouth two times a day as needed anxiety  Patient Instructions: 1)  Referral Appointment Information 2)  Day/Date: 3)  Time: 4)  Place/MD: 5)  Address: 6)  Phone/Fax: 7)  Patient given appointment information. Information/Orders faxed/mailed.    Orders Added: 1)  Pulmonary Referral [Pulmonary] 2)  Est. Patient Level IV [02725]    Current Allergies (reviewed today): ! CODEINE * SULFA (SULFONAMIDES) GROUP * PAROXETINE ANALOGUES GROUP PERCOCET (OXYCODONE-ACETAMINOPHEN)

## 2011-03-22 ENCOUNTER — Other Ambulatory Visit: Payer: Self-pay | Admitting: *Deleted

## 2011-03-22 MED ORDER — HYDROCHLOROTHIAZIDE 25 MG PO TABS: 25.0000 mg | ORAL_TABLET | Freq: Every day | ORAL | Status: DC

## 2011-04-09 ENCOUNTER — Other Ambulatory Visit: Payer: Self-pay | Admitting: *Deleted

## 2011-04-09 NOTE — Telephone Encounter (Signed)
Last filled 02/23/11. 

## 2011-04-11 NOTE — Telephone Encounter (Signed)
Ok to refill.  #30, 1 refill 

## 2011-04-12 MED ORDER — LORAZEPAM 0.5 MG PO TABS: ORAL_TABLET | ORAL | Status: DC

## 2011-05-10 ENCOUNTER — Ambulatory Visit: Payer: BC Managed Care – PPO | Admitting: Pulmonary Disease

## 2011-06-18 ENCOUNTER — Other Ambulatory Visit: Payer: Self-pay | Admitting: *Deleted

## 2011-06-18 MED ORDER — LORAZEPAM 0.5 MG PO TABS: ORAL_TABLET | ORAL | Status: DC

## 2011-06-18 NOTE — Telephone Encounter (Signed)
Rx called to pharmacy

## 2011-06-18 NOTE — Telephone Encounter (Signed)
Ok to refill 30, 2 refills 

## 2011-08-16 ENCOUNTER — Other Ambulatory Visit: Payer: Self-pay | Admitting: Family Medicine

## 2011-08-25 ENCOUNTER — Telehealth: Payer: Self-pay | Admitting: Internal Medicine

## 2011-08-25 NOTE — Telephone Encounter (Signed)
Can we discuss - and can you help me call. I did not do a full physical on her and we discussed multiple medical problems, so we billed this correctly originally.

## 2011-08-25 NOTE — Telephone Encounter (Signed)
Patient called and stated on the November 25, 2010 she was seen by Dr. Dallas Schimke and in order for her insurance to pay it has to say a wellness checkup.  Please add to visit date and fax [phone] attention Ortha Goding.  Please advise.

## 2011-08-26 NOTE — Telephone Encounter (Signed)
I will call pt to discuss....[place] 08-26-2011

## 2011-10-21 ENCOUNTER — Other Ambulatory Visit: Payer: Self-pay | Admitting: Family Medicine

## 2011-10-21 NOTE — Telephone Encounter (Signed)
Ok to refill 30, 5 refills 

## 2011-10-22 NOTE — Telephone Encounter (Signed)
rx called to pharmacy 

## 2012-03-08 ENCOUNTER — Ambulatory Visit (INDEPENDENT_AMBULATORY_CARE_PROVIDER_SITE_OTHER): Payer: BC Managed Care – PPO | Admitting: Family Medicine

## 2012-03-08 ENCOUNTER — Encounter: Payer: Self-pay | Admitting: Family Medicine

## 2012-03-08 VITALS — BP 150/100 | HR 123 | Temp 97.8°F | Ht 63.5 in | Wt 201.0 lb

## 2012-03-08 DIAGNOSIS — R079 Chest pain, unspecified: Secondary | ICD-10-CM

## 2012-03-08 DIAGNOSIS — R000 Tachycardia, unspecified: Secondary | ICD-10-CM

## 2012-03-08 DIAGNOSIS — E785 Hyperlipidemia, unspecified: Secondary | ICD-10-CM

## 2012-03-08 DIAGNOSIS — Z0000 Encounter for general adult medical examination without abnormal findings: Secondary | ICD-10-CM

## 2012-03-08 DIAGNOSIS — Z79899 Other long term (current) drug therapy: Secondary | ICD-10-CM

## 2012-03-08 MED ORDER — LORAZEPAM 0.5 MG PO TABS: 0.5000 mg | ORAL_TABLET | Freq: Every day | ORAL | Status: DC

## 2012-03-08 MED ORDER — DILTIAZEM HCL ER BEADS 120 MG PO CP24: 120.0000 mg | ORAL_CAPSULE | Freq: Every day | ORAL | Status: DC

## 2012-03-08 NOTE — Progress Notes (Signed)
Nature conservation officer at [facility] [address] [place] [address] Phone: [phone] Fax: [phone]   Patient Name: Anne Rush Date of Birth: 16-Dec-1960 Medical Record Number: [id number] Gender: female Date of Encounter: 03/08/2012  History of Present Illness:  Anne Rush is a 51 y.o. very pleasant female patient who presents with the following:  No mammo last year Goes to [facility] for paps  Health Maintenance Summary Reviewed and updated, unless pt declines services.  Tobacco History Reviewed. Non-smoker - now. Prior 30 year pack year history. Alcohol: No concerns, no excessive use Exercise Habits: minimal STD concerns: none Drug Use: None Birth control method: BTL Menses regular: yes Lumps or breast concerns: no  Health Maintenance  Topic Date Due  . Pap Smear  09/30/2011  . Tetanus/tdap  05/10/2012  . Influenza Vaccine  06/20/2012   She has been doing remarkably well in terms of her interstitial lung disease, and has been followed by Dr. Sandy Salaam at [facility]. She has done very well since stopping her smoking.  Patient Active Problem List  Diagnosis  . HYPERLIPIDEMIA  . ANXIETY  . TOBACCO USE  . HYPERTENSION  . ALLERGIC RHINITIS  . INTERSTITIAL LUNG DISEASE  . LOW BACK PAIN, ACUTE  . DYSPNEA/SHORTNESS OF BREATH  . OTH NONSPC ABN FINDNG RAD&OTH EXM BODY STRUCTURE   Past Medical History  Diagnosis Date  . Pollen allergies   . Interstitial lung disease   . Anxiety   . Hyperlipidemia   . Hypertension    Past Surgical History  Procedure Date  . Cosmetic surgery   . Tubal ligation    History  Substance Use Topics  . Smoking status: Current Everyday Smoker  . Smokeless tobacco: Not on file  . Alcohol Use: No   Family History  Problem Relation Age of Onset  . Anxiety disorder Mother   . Hypertension Mother   . Cancer Maternal Aunt     breast  . Heart failure Maternal Grandfather    Allergies  Allergen Reactions  . Codeine     REACTION:  hives, itching  . Oxycodone-Acetaminophen     REACTION: unspecified  . Paroxetine   . Sulfonamide Derivatives     REACTION: unspecified    Medication list has been reviewed and updated.  Prior to Admission medications   Medication Sig Start Date End Date Taking? Authorizing Provider  hydrochlorothiazide (HYDRODIURIL) 25 MG tablet TAKE 1 TAB BY MOUTH EVERY MORNING 08/16/11  Yes Talmage Teaster, MD  LORazepam (ATIVAN) 0.5 MG tablet TAKE 1 TABLET TWICE DAILY AS NEEDED 10/21/11  Yes [name], MD    Review of Systems:   General: Denies fever, chills, sweats. No significant weight loss. Eyes: Denies blurring,significant itching ENT: Denies earache, sore throat, and hoarseness.  Cardiovascular: Denies chest pains, palpitations. Tachycardia, since young age. Respiratory: sob is much improved Breast: no concerns about lumps GI: Denies nausea, vomiting, diarrhea, constipation, change in bowel habits, abdominal pain, melena, hematochezia GU: Denies dysuria, hematuria, urinary hesitancy, nocturia, denies STD risk, no concerns about discharge Musculoskeletal: Denies back pain, joint pain Derm: Denies rash, itching Neuro: Denies  paresthesias, frequent falls, frequent headaches Psych: Denies depression, anxiety Endocrine: Denies cold intolerance, heat intolerance, polydipsia Heme: Denies enlarged lymph nodes Allergy: No hayfever   Physical Examination: Filed Vitals:   03/08/12 1440  BP: 150/100  Pulse: 123  Temp: 97.8 F (36.6 C)   Filed Vitals:   03/08/12 1440  Height: 5' 3.5" (1.613 m)  Weight: 201 lb (91.173  kg)   Body mass index is 35.05 kg/(m^2). Ideal Body Weight: Weight in (lb) to have BMI = 25: 143.1    GEN: well developed, well nourished, no acute distress Eyes: conjunctiva and lids normal, PERRLA, EOMI ENT: TM clear, nares clear, oral exam WNL Neck: supple, no lymphadenopathy, no thyromegaly, no JVD Pulm: clear to auscultation and percussion, respiratory  effort normal CV: tachy to 110 on exam, S1-S2, no murmur, rub or gallop, no bruits BREAST: breast exam declined GI: soft, non-tender; no hepatosplenomegaly, masses; active bowel sounds all quadrants GU: GU exam declined Lymph: no cervical, axillary or inguinal adenopathy MSK: gait normal, muscle tone and strength WNL, no joint swelling, effusions, discoloration, crepitus  SKIN: clear, good turgor, color WNL, no rashes, lesions, or ulcerations Neuro: normal mental status, normal strength, sensation, and motion Psych: alert; oriented to person, place and time, normally interactive and not anxious or depressed in appearance.    Assessment and Plan:  Pure health maintenance.  Note: the patient is not having chest pain. This diagnosis was placed into the EMR, but I cannot make it leave the electronic record.   The patient's preventative maintenance and recommended screening tests for an annual wellness exam were reviewed in full today. Brought up to date unless services declined.  Counselled on the importance of diet, exercise, and its role in overall health and mortality. The patient's FH and SH was reviewed, including their home life, tobacco status, and drug and alcohol status.   F/u with OB for pap and breast exam Will call for mammo Labs today  Tachy, but EKG reassuring  EKG:  sinus rhythm, rate 100. Normal axis, normal R wave progression, No acute ST elevation or depression.  dilt for incr bp and will likely help rate  [name], MD

## 2012-03-09 ENCOUNTER — Encounter: Payer: Self-pay | Admitting: *Deleted

## 2012-03-09 LAB — BASIC METABOLIC PANEL
BUN: 9 mg/dL (ref 6–23)
CO2: 28 mEq/L (ref 19–32)
Calcium: 9.4 mg/dL (ref 8.4–10.5)
Chloride: 100 mEq/L (ref 96–112)
Creatinine, Ser: 0.5 mg/dL (ref 0.4–1.2)
GFR: 127.4 mL/min (ref >60.00)
Glucose, Bld: 90 mg/dL (ref 70–99)
Potassium: 3.6 mEq/L (ref 3.5–5.1)
Sodium: 139 mEq/L (ref 135–145)

## 2012-03-09 LAB — CBC WITH DIFFERENTIAL/PLATELET
Basophils Absolute: 0 10*3/uL (ref 0.0–0.1)
Basophils Relative: 0.4 % (ref 0.0–3.0)
Eosinophils Absolute: 0 10*3/uL (ref 0.0–0.7)
Eosinophils Relative: 0.7 % (ref 0.0–5.0)
HCT: 43.5 % (ref 36.0–46.0)
Hemoglobin: 14.5 g/dL (ref 12.0–15.0)
Lymphocytes Relative: 45.5 % (ref 12.0–46.0)
Lymphs Abs: 3 10*3/uL (ref 0.7–4.0)
MCHC: 33.4 g/dL (ref 30.0–36.0)
MCV: 93 fl (ref 78.0–100.0)
Monocytes Absolute: 0.4 10*3/uL (ref 0.1–1.0)
Monocytes Relative: 5.4 % (ref 3.0–12.0)
Neutro Abs: 3.2 10*3/uL (ref 1.4–7.7)
Neutrophils Relative %: 48 % (ref 43.0–77.0)
Platelets: 238 10*3/uL (ref 150.0–400.0)
RBC: 4.67 Mil/uL (ref 3.87–5.11)
RDW: 12.8 % (ref 11.5–14.6)
WBC: 6.6 10*3/uL (ref 4.5–10.5)

## 2012-03-09 LAB — TSH: TSH: 1.13 u[IU]/mL (ref 0.35–5.50)

## 2012-03-09 LAB — HEPATIC FUNCTION PANEL
ALT: 25 U/L (ref 0–35)
AST: 32 U/L (ref 0–37)
Albumin: 4.5 g/dL (ref 3.5–5.2)
Alkaline Phosphatase: 47 U/L (ref 39–117)
Bilirubin, Direct: 0.1 mg/dL (ref 0.0–0.3)
Total Bilirubin: 0.6 mg/dL (ref 0.3–1.2)
Total Protein: 7.8 g/dL (ref 6.0–8.3)

## 2012-03-09 LAB — LDL CHOLESTEROL, DIRECT: Direct LDL: 166.9 mg/dL

## 2012-05-12 ENCOUNTER — Other Ambulatory Visit: Payer: Self-pay | Admitting: Family Medicine

## 2012-09-20 DIAGNOSIS — IMO0002 Reserved for concepts with insufficient information to code with codable children: Secondary | ICD-10-CM

## 2012-09-20 HISTORY — DX: Reserved for concepts with insufficient information to code with codable children: IMO0002

## 2012-09-21 ENCOUNTER — Other Ambulatory Visit: Payer: Self-pay | Admitting: Family Medicine

## 2012-09-21 NOTE — Telephone Encounter (Signed)
rx called to pharmacy 

## 2012-09-21 NOTE — Telephone Encounter (Signed)
Please call in as refilled above with #30, 4 refills

## 2012-12-11 ENCOUNTER — Encounter: Payer: Self-pay | Admitting: *Deleted

## 2012-12-11 ENCOUNTER — Ambulatory Visit (INDEPENDENT_AMBULATORY_CARE_PROVIDER_SITE_OTHER): Payer: BC Managed Care – PPO | Admitting: Family Medicine

## 2012-12-11 ENCOUNTER — Encounter: Payer: Self-pay | Admitting: Family Medicine

## 2012-12-11 VITALS — BP 154/92 | HR 111 | Temp 98.2°F | Wt 203.8 lb

## 2012-12-11 DIAGNOSIS — J069 Acute upper respiratory infection, unspecified: Secondary | ICD-10-CM

## 2012-12-11 DIAGNOSIS — J8410 Pulmonary fibrosis, unspecified: Secondary | ICD-10-CM

## 2012-12-11 MED ORDER — BENZONATATE 100 MG PO CAPS: 100.0000 mg | ORAL_CAPSULE | Freq: Three times a day (TID) | ORAL | Status: DC | PRN

## 2012-12-11 MED ORDER — FLUTICASONE PROPIONATE 50 MCG/ACT NA SUSP: 2.0000 | Freq: Every day | NASAL | Status: DC

## 2012-12-11 NOTE — Progress Notes (Signed)
[facility] at [facility] [address] [place] [address] Phone: [phone] Fax: [phone]  Date:  12/11/2012   Name:  Anne Rush   DOB:  03-07-1961   MRN:  [id number] Gender: female Age: 52 y.o.  Primary Physician:  [name], MD  Evaluating MD: [name], MD   Chief Complaint: Nasal Congestion and Cough   History of Present Illness:  Anne Rush is a 52 y.o. pleasant patient who presents with the following:  52 yo interstitial lung disease:   Not using anything for cough Known interstitial lung disease, off all smoking.  Coughing a lot and having trouble sleeping at night.  No sore throat, no earache, n/v/d.  Patient Active Problem List  Diagnosis  . HYPERLIPIDEMIA  . ANXIETY  . TOBACCO USE  . HYPERTENSION  . ALLERGIC RHINITIS  . INTERSTITIAL LUNG DISEASE  . LOW BACK PAIN, ACUTE  . DYSPNEA/SHORTNESS OF BREATH  . OTH NONSPC ABN FINDNG RAD&OTH EXM BODY STRUCTURE    Past Medical History  Diagnosis Date  . Pollen allergies   . Interstitial lung disease   . Anxiety   . Hyperlipidemia   . Hypertension     Past Surgical History  Procedure Laterality Date  . Cosmetic surgery    . Tubal ligation      History   Social History  . Marital Status: Married    Spouse Name: N/A    Number of Children: 1  . Years of Education: N/A   Occupational History  . house wife    Social History Main Topics  . Smoking status: Current Every Day Smoker  . Smokeless tobacco: Not on file  . Alcohol Use: No  . Drug Use: No  . Sexually Active: Not on file   Other Topics Concern  . Not on file   Social History Narrative   Regular exercise-no    Family History  Problem Relation Age of Onset  . Anxiety disorder Mother   . Hypertension Mother   . Cancer Maternal Aunt     breast  . Heart failure Maternal Grandfather     Allergies  Allergen Reactions  . Codeine     REACTION: hives, itching  . Oxycodone-Acetaminophen    REACTION: unspecified  . Paroxetine   . Sulfonamide Derivatives     REACTION: unspecified    Medication list has been reviewed and updated.  Outpatient Prescriptions Prior to Visit  Medication Sig Dispense Refill  . hydrochlorothiazide (HYDRODIURIL) 25 MG tablet TAKE 1 TABLET (25 MG TOTAL) BY MOUTH DAILY.  90 tablet  3  . LORazepam (ATIVAN) 0.5 MG tablet Take 1 tablet (0.5 mg total) by mouth at bedtime as needed for anxiety.  30 tablet  4  . diltiazem (TIAZAC) 120 MG 24 hr capsule Take 1 capsule (120 mg total) by mouth daily.  30 capsule  5   No facility-administered medications prior to visit.    Review of Systems:  ROS: GEN: Acute illness details above GI: Tolerating PO intake GU: maintaining adequate hydration and urination Pulm: No SOB Interactive and getting along well at home.  Otherwise, ROS is as per the HPI.   Physical Examination: BP 154/92  Pulse 111  Temp(Src) 98.2 F (36.8 C) (Oral)  Wt 203 lb 12 oz (92.42 kg)  BMI 35.52 kg/m2  SpO2 94%  Ideal Body Weight:     GEN: A and O x 3. WDWN. NAD.    ENT: Nose clear, ext NML.  No  LAD.  No JVD.  TM's clear. Oropharynx clear.  PULM: Normal WOB, no distress. No crackles, wheezes. Rare rhonchi. CV: RRR, no M/G/R, No rubs, No JVD.   EXT: warm and well-perfused, notable clubbing PSYCH: Pleasant and conversant.   Assessment and Plan:  URI (upper respiratory infection)  INTERSTITIAL LUNG DISEASE  Probable uri flonase and tessalon  Orders Today:  No orders of the defined types were placed in this encounter.    Updated Medication List: (Includes new medications, updates to list, dose adjustments) Meds ordered this encounter  Medications  . amoxicillin (AMOXIL) 875 MG tablet    Sig: Take 875 mg by mouth 2 (two) times daily.  . benzonatate (TESSALON) 100 MG capsule    Sig: Take 1 capsule (100 mg total) by mouth 3 (three) times daily as needed for cough.    Dispense:  40 capsule    Refill:  0  .  fluticasone (FLONASE) 50 MCG/ACT nasal spray    Sig: Place 2 sprays into the nose daily.    Dispense:  16 g    Refill:  2    Medications Discontinued: Medications Discontinued During This Encounter  Medication Reason  . diltiazem (TIAZAC) 120 MG 24 hr capsule Patient has not taken in last 30 days      Signed, Mishael Krysiak T. Orris Perin, MD 12/11/2012 11:11 AM

## 2013-01-23 ENCOUNTER — Other Ambulatory Visit: Payer: Self-pay | Admitting: Family Medicine

## 2013-01-23 NOTE — Telephone Encounter (Signed)
rx called to pharmacy 

## 2013-01-23 NOTE — Telephone Encounter (Signed)
Ok to refill #30, 3 refills

## 2013-02-10 ENCOUNTER — Other Ambulatory Visit: Payer: Self-pay | Admitting: Family Medicine

## 2013-03-07 ENCOUNTER — Encounter: Payer: Self-pay | Admitting: Family Medicine

## 2013-03-09 ENCOUNTER — Encounter: Payer: Self-pay | Admitting: Radiology

## 2013-03-12 ENCOUNTER — Encounter: Payer: Self-pay | Admitting: Family Medicine

## 2013-03-12 ENCOUNTER — Ambulatory Visit (INDEPENDENT_AMBULATORY_CARE_PROVIDER_SITE_OTHER): Payer: BC Managed Care – PPO | Admitting: Family Medicine

## 2013-03-12 VITALS — BP 120/74 | HR 85 | Temp 98.1°F | Ht 63.5 in | Wt 198.1 lb

## 2013-03-12 DIAGNOSIS — R5381 Other malaise: Secondary | ICD-10-CM

## 2013-03-12 DIAGNOSIS — R5383 Other fatigue: Secondary | ICD-10-CM

## 2013-03-12 DIAGNOSIS — I10 Essential (primary) hypertension: Secondary | ICD-10-CM

## 2013-03-12 DIAGNOSIS — Z1211 Encounter for screening for malignant neoplasm of colon: Secondary | ICD-10-CM

## 2013-03-12 DIAGNOSIS — E785 Hyperlipidemia, unspecified: Secondary | ICD-10-CM

## 2013-03-12 DIAGNOSIS — Z0000 Encounter for general adult medical examination without abnormal findings: Secondary | ICD-10-CM

## 2013-03-12 LAB — CBC WITH DIFFERENTIAL/PLATELET
Basophils Absolute: 0 10*3/uL (ref 0.0–0.1)
Basophils Relative: 0.2 % (ref 0.0–3.0)
Eosinophils Absolute: 0 10*3/uL (ref 0.0–0.7)
Eosinophils Relative: 0 % (ref 0.0–5.0)
HCT: 42.9 % (ref 36.0–46.0)
Hemoglobin: 14.8 g/dL (ref 12.0–15.0)
Lymphocytes Relative: 12.3 % (ref 12.0–46.0)
Lymphs Abs: 2.1 10*3/uL (ref 0.7–4.0)
MCHC: 34.4 g/dL (ref 30.0–36.0)
MCV: 92.9 fl (ref 78.0–100.0)
Monocytes Absolute: 0.8 10*3/uL (ref 0.1–1.0)
Monocytes Relative: 4.4 % (ref 3.0–12.0)
Neutro Abs: 14.3 10*3/uL — ABNORMAL HIGH (ref 1.4–7.7)
Neutrophils Relative %: 83.1 % — ABNORMAL HIGH (ref 43.0–77.0)
Platelets: 321 10*3/uL (ref 150.0–400.0)
RBC: 4.62 Mil/uL (ref 3.87–5.11)
RDW: 12.4 % (ref 11.5–14.6)
WBC: 17.2 10*3/uL — ABNORMAL HIGH (ref 4.5–10.5)

## 2013-03-12 LAB — LIPID PANEL
Cholesterol: 242 mg/dL — ABNORMAL HIGH (ref 0–200)
HDL: 43.8 mg/dL (ref >39.00)
Total CHOL/HDL Ratio: 6
Triglycerides: 246 mg/dL — ABNORMAL HIGH (ref 0.0–149.0)
VLDL: 49.2 mg/dL — ABNORMAL HIGH (ref 0.0–40.0)

## 2013-03-12 LAB — HEPATIC FUNCTION PANEL
ALT: 21 U/L (ref 0–35)
AST: 19 U/L (ref 0–37)
Albumin: 4.5 g/dL (ref 3.5–5.2)
Alkaline Phosphatase: 47 U/L (ref 39–117)
Bilirubin, Direct: 0 mg/dL (ref 0.0–0.3)
Total Bilirubin: 0.6 mg/dL (ref 0.3–1.2)
Total Protein: 8.1 g/dL (ref 6.0–8.3)

## 2013-03-12 LAB — BASIC METABOLIC PANEL
BUN: 12 mg/dL (ref 6–23)
CO2: 25 mEq/L (ref 19–32)
Calcium: 9.6 mg/dL (ref 8.4–10.5)
Chloride: 101 mEq/L (ref 96–112)
Creatinine, Ser: 0.7 mg/dL (ref 0.4–1.2)
GFR: 102.44 mL/min (ref >60.00)
Glucose, Bld: 118 mg/dL — ABNORMAL HIGH (ref 70–99)
Potassium: 4 mEq/L (ref 3.5–5.1)
Sodium: 138 mEq/L (ref 135–145)

## 2013-03-12 LAB — TSH: TSH: 0.54 u[IU]/mL (ref 0.35–5.50)

## 2013-03-12 NOTE — Patient Instructions (Addendum)
1/2 tablet at night for 2 weeks Then 1/2 tablet every other night for 1 week, then stop

## 2013-03-12 NOTE — Progress Notes (Signed)
Nature conservation officer at [facility] [address] [place] [address] Phone: [phone] Fax: [phone]  Date:  03/12/2013   Name:  Anne Rush   DOB:  30-Mar-1961   MRN:  [id number] Gender: female Age: 52 y.o.  Primary Physician:  [name], MD  Evaluating MD: [name], MD   Chief Complaint: Annual Exam   History of Present Illness:  Anne Rush is a 52 y.o. pleasant patient who presents with the following:  CPX:  Colon: no colonoscopy, will do IFOB. Declines colonoscopy referral. mammo - she will set up. Breast/pap? - Deno Lunger  Had some sweating, felt like could not talk. Burning and wheezing some. Mucous? Squamous cell carcinoma.  Health Maintenance Summary Reviewed and updated, unless pt declines services.  Tobacco History Reviewed. Non-smoker - former Alcohol: No concerns, no excessive use Exercise Habits: rare STD concerns: none Drug Use: None Menses regular: yes Lumps or breast concerns: no  Health Maintenance  Topic Date Due  . Tetanus/tdap  05/10/2012  . Mammogram  03/13/2014  . Pap Smear  03/13/2014  . Colonoscopy  03/13/2014  . Influenza Vaccine  05/21/2013    Labs reviewed with the patient.  Results for orders placed in visit on 03/12/13  BASIC METABOLIC PANEL      Result Value Range   Sodium 138  135 - 145 mEq/L   Potassium 4.0  3.5 - 5.1 mEq/L   Chloride 101  96 - 112 mEq/L   CO2 25  19 - 32 mEq/L   Glucose, Bld 118 (*) 70 - 99 mg/dL   BUN 12  6 - 23 mg/dL   Creatinine, Ser 0.7  0.4 - 1.2 mg/dL   Calcium 9.6  8.4 - 29.5 mg/dL   GFR 284.13  >24.40 mL/min  CBC WITH DIFFERENTIAL      Result Value Range   WBC 17.2 (*) 4.5 - 10.5 K/uL   RBC 4.62  3.87 - 5.11 Mil/uL   Hemoglobin 14.8  12.0 - 15.0 g/dL   HCT 10.2  72.5 - 36.6 %   MCV 92.9  78.0 - 100.0 fl   MCHC 34.4  30.0 - 36.0 g/dL   RDW 44.0  34.7 - 42.5 %   Platelets 321.0  150.0 - 400.0 K/uL   Neutrophils Relative % 83.1 (*) 43.0 - 77.0 %   Lymphocytes  Relative 12.3  12.0 - 46.0 %   Monocytes Relative 4.4  3.0 - 12.0 %   Eosinophils Relative 0.0  0.0 - 5.0 %   Basophils Relative 0.2  0.0 - 3.0 %   Neutro Abs 14.3 (*) 1.4 - 7.7 K/uL   Lymphs Abs 2.1  0.7 - 4.0 K/uL   Monocytes Absolute 0.8  0.1 - 1.0 K/uL   Eosinophils Absolute 0.0  0.0 - 0.7 K/uL   Basophils Absolute 0.0  0.0 - 0.1 K/uL  HEPATIC FUNCTION PANEL      Result Value Range   Total Bilirubin 0.6  0.3 - 1.2 mg/dL   Bilirubin, Direct 0.0  0.0 - 0.3 mg/dL   Alkaline Phosphatase 47  39 - 117 U/L   AST 19  0 - 37 U/L   ALT 21  0 - 35 U/L   Total Protein 8.1  6.0 - 8.3 g/dL   Albumin 4.5  3.5 - 5.2 g/dL  LIPID PANEL      Result Value Range   Cholesterol 242 (*) 0 - 200 mg/dL   Triglycerides 956.3 (*) 0.0 -  149.0 mg/dL   HDL 16.10  >96.04 mg/dL   VLDL 54.0 (*) 0.0 - 98.1 mg/dL   Total CHOL/HDL Ratio 6    TSH      Result Value Range   TSH 0.54  0.35 - 5.50 uIU/mL  LDL CHOLESTEROL, DIRECT      Result Value Range   Direct LDL 174.6        Patient Active Problem List   Diagnosis Date Noted  . OTH NONSPC ABN FINDNG RAD&OTH EXM BODY STRUCTURE 10/06/2010  . INTERSTITIAL LUNG DISEASE 08/27/2010  . DYSPNEA/SHORTNESS OF BREATH 08/27/2010  . LOW BACK PAIN, ACUTE 08/12/2009  . TOBACCO USE 08/28/2008  . HYPERLIPIDEMIA 06/21/2007  . ANXIETY 06/05/2007  . HYPERTENSION 06/05/2007  . ALLERGIC RHINITIS 06/05/2007    Past Medical History  Diagnosis Date  . Pollen allergies   . Interstitial lung disease   . Anxiety   . Hyperlipidemia   . Hypertension     Past Surgical History  Procedure Laterality Date  . Cosmetic surgery    . Tubal ligation      History   Social History  . Marital Status: Married    Spouse Name: N/A    Number of Children: 1  . Years of Education: N/A   Occupational History  . house wife    Social History Main Topics  . Smoking status: Current Every Day Smoker  . Smokeless tobacco: Not on file  . Alcohol Use: No  . Drug Use: No  .  Sexually Active: Not on file   Other Topics Concern  . Not on file   Social History Narrative   Regular exercise-no    Family History  Problem Relation Age of Onset  . Anxiety disorder Mother   . Hypertension Mother   . Cancer Maternal Aunt     breast  . Heart failure Maternal Grandfather     Allergies  Allergen Reactions  . Codeine     REACTION: hives, itching  . Oxycodone-Acetaminophen     REACTION: unspecified  . Paroxetine   . Sulfonamide Derivatives     REACTION: unspecified    Medication list has been reviewed and updated.  Outpatient Prescriptions Prior to Visit  Medication Sig Dispense Refill  . hydrochlorothiazide (HYDRODIURIL) 25 MG tablet TAKE 1 TABLET (25 MG TOTAL) BY MOUTH DAILY.  90 tablet  2  . LORazepam (ATIVAN) 0.5 MG tablet TAKE 1 TABLET AT BEDTIME AS NEEDED FOR ANXIETY  30 tablet  3  . amoxicillin (AMOXIL) 875 MG tablet Take 875 mg by mouth 2 (two) times daily.      . benzonatate (TESSALON) 100 MG capsule Take 1 capsule (100 mg total) by mouth 3 (three) times daily as needed for cough.  40 capsule  0  . fluticasone (FLONASE) 50 MCG/ACT nasal spray Place 2 sprays into the nose daily.  16 g  2  . hydrochlorothiazide (HYDRODIURIL) 25 MG tablet TAKE 1 TABLET (25 MG TOTAL) BY MOUTH DAILY.  90 tablet  3   No facility-administered medications prior to visit.    Review of Systems:   General: Denies fever, chills, SOME RECENT SWEATING. No significant weight loss. Eyes: Denies blurring,significant itching ENT: Denies earache, sore throat, and hoarseness.  Cardiovascular: Denies chest pains, palpitations, dyspnea on exertion,  Respiratory: ACUTE ILLNESS AS ABOVE, BUT GENERALLY MINIMAL SOB Breast: no concerns about lumps GI: Denies nausea, vomiting, diarrhea, constipation, change in bowel habits, abdominal pain, melena, hematochezia GU: Denies dysuria, hematuria, urinary hesitancy, nocturia, denies STD risk,  no concerns about discharge Musculoskeletal:  Denies back pain, joint pain Derm: Denies rash, itching Neuro: Denies  paresthesias, frequent falls, frequent headaches Psych: Denies depression, anxiety Endocrine: Denies cold intolerance, heat intolerance, polydipsia Heme: Denies enlarged lymph nodes Allergy: No hayfever   Physical Examination: Filed Vitals:   03/12/13 0834 03/13/13 1339  BP: 120/74   Pulse: 130 85  Temp: 98.1 F (36.7 C)   TempSrc: Oral   Height: 5' 3.5" (1.613 m)   Weight: 198 lb 2 oz (89.869 kg)   SpO2: 95%    pulse normal during my exam. [name] , MD  Ideal Body Weight: Weight in (lb) to have BMI = 25: 143.1   GEN: well developed, well nourished, no acute distress Eyes: conjunctiva and lids normal, PERRLA, EOMI ENT: TM clear, nares clear, oral exam WNL Neck: supple, no lymphadenopathy, no thyromegaly, no JVD Pulm: clear to auscultation and percussion, respiratory effort normal CV: regular rate and rhythm, S1-S2, no murmur, rub or gallop, no bruits Chest: no scars, masses, no lumps BREAST: breast exam declined GI: soft, non-tender; no hepatosplenomegaly, masses; active bowel sounds all quadrants GU: GU exam declined Lymph: no cervical, axillary or inguinal adenopathy MSK: gait normal, muscle tone and strength WNL, no joint swelling, effusions, discoloration, crepitus  SKIN: clear, good turgor, color WNL, no rashes, lesions, or ulcerations Neuro: normal mental status, normal strength, sensation, and motion Psych: alert; oriented to person, place and time, normally interactive and not anxious or depressed in appearance. EXT: mild clubbing, no cyanosis  Assessment and Plan:  Routine general medical examination at a health care facility  HYPERLIPIDEMIA - Plan: Hepatic function panel, Lipid panel  HYPERTENSION - Plan: Basic metabolic panel  Special screening for malignant neoplasms, colon - Plan: Fecal occult blood, imunochemical, CANCELED: Fecal occult blood, imunochemical  Other malaise  and fatigue - Plan: CBC with Differential, TSH  The patient's preventative maintenance and recommended screening tests for an annual wellness exam were reviewed in full today. Brought up to date unless services declined.  Counselled on the importance of diet, exercise, and its role in overall health and mortality. The patient's FH and SH was reviewed, including their home life, tobacco status, and drug and alcohol status.   Declines colon, check ifob F/u Dr. Luella Cook for GYN exam  Orders Today:  Orders Placed This Encounter  Procedures  . Fecal occult blood, imunochemical    Standing Status: Future     Number of Occurrences:      Standing Expiration Date: 03/12/2014  . Basic metabolic panel  . CBC with Differential  . Hepatic function panel  . Lipid panel  . TSH  . LDL cholesterol, direct    Updated Medication List: (Includes new medications, updates to list, dose adjustments) No orders of the defined types were placed in this encounter.    Medications Discontinued: Medications Discontinued During This Encounter  Medication Reason  . hydrochlorothiazide (HYDRODIURIL) 25 MG tablet Error  . amoxicillin (AMOXIL) 875 MG tablet Error  . benzonatate (TESSALON) 100 MG capsule Error  . fluticasone (FLONASE) 50 MCG/ACT nasal spray Error      Signed, Shakerria Parran T. Javad Salva, MD 03/12/2013 8:50 AM

## 2013-03-13 ENCOUNTER — Encounter: Payer: Self-pay | Admitting: Family Medicine

## 2013-03-13 LAB — LDL CHOLESTEROL, DIRECT: Direct LDL: 174.6 mg/dL

## 2013-03-21 LAB — HM PAP SMEAR: HM Pap smear: NORMAL

## 2013-05-16 ENCOUNTER — Other Ambulatory Visit: Payer: Self-pay | Admitting: Family Medicine

## 2013-05-19 ENCOUNTER — Other Ambulatory Visit: Payer: Self-pay | Admitting: Family Medicine

## 2013-05-22 NOTE — Telephone Encounter (Signed)
Last office visit 03/12/2013.  Ok to refill?

## 2013-05-22 NOTE — Telephone Encounter (Signed)
Ok to refill #30, 3 refills  Thanks.

## 2013-07-26 ENCOUNTER — Other Ambulatory Visit: Payer: Self-pay

## 2013-09-09 ENCOUNTER — Other Ambulatory Visit: Payer: Self-pay | Admitting: Family Medicine

## 2013-09-09 NOTE — Telephone Encounter (Signed)
Last office visit 03/12/2013.  Ok to refill?

## 2013-09-10 NOTE — Telephone Encounter (Signed)
Called to [employer]. Anne Rush.

## 2013-09-10 NOTE — Telephone Encounter (Signed)
Ok to refill 30, 2 refills 

## 2013-10-13 ENCOUNTER — Other Ambulatory Visit: Payer: Self-pay | Admitting: Family Medicine

## 2013-11-22 ENCOUNTER — Other Ambulatory Visit: Payer: Self-pay | Admitting: Family Medicine

## 2014-02-21 ENCOUNTER — Other Ambulatory Visit: Payer: Self-pay | Admitting: Family Medicine

## 2014-04-24 ENCOUNTER — Ambulatory Visit (INDEPENDENT_AMBULATORY_CARE_PROVIDER_SITE_OTHER): Payer: BC Managed Care – PPO | Admitting: Family Medicine

## 2014-04-24 ENCOUNTER — Encounter: Payer: Self-pay | Admitting: Family Medicine

## 2014-04-24 VITALS — BP 150/100 | HR 93 | Temp 98.1°F | Ht 63.0 in | Wt 193.8 lb

## 2014-04-24 DIAGNOSIS — I10 Essential (primary) hypertension: Secondary | ICD-10-CM

## 2014-04-24 DIAGNOSIS — R5381 Other malaise: Secondary | ICD-10-CM

## 2014-04-24 DIAGNOSIS — Z0000 Encounter for general adult medical examination without abnormal findings: Secondary | ICD-10-CM

## 2014-04-24 DIAGNOSIS — E039 Hypothyroidism, unspecified: Secondary | ICD-10-CM

## 2014-04-24 DIAGNOSIS — R5383 Other fatigue: Secondary | ICD-10-CM

## 2014-04-24 DIAGNOSIS — E785 Hyperlipidemia, unspecified: Secondary | ICD-10-CM

## 2014-04-24 DIAGNOSIS — Z79899 Other long term (current) drug therapy: Secondary | ICD-10-CM

## 2014-04-24 DIAGNOSIS — Z1211 Encounter for screening for malignant neoplasm of colon: Secondary | ICD-10-CM

## 2014-04-24 DIAGNOSIS — E669 Obesity, unspecified: Secondary | ICD-10-CM

## 2014-04-24 DIAGNOSIS — F411 Generalized anxiety disorder: Secondary | ICD-10-CM

## 2014-04-24 MED ORDER — LORAZEPAM 0.5 MG PO TABS: ORAL_TABLET | ORAL | Status: DC

## 2014-04-24 NOTE — Progress Notes (Signed)
Pre visit review using our clinic review tool, if applicable. No additional management support is needed unless otherwise documented below in the visit note. 

## 2014-04-24 NOTE — Progress Notes (Signed)
[place] [address]                Phone: [phone]                  Fax: [phone] 04/24/2014  ID: Anne Rush   MRN: [id number]  DOB: 1960-10-11  Primary Physician:  Owens Loffler, MD  Chief Complaint: Annual Exam  Subjective:   History of Present Illness:  Anne Rush is a 53 y.o. pleasant patient who presents with the following:  Health Maintenance Summary Reviewed and updated, unless pt declines services. Additional f/u:  Tobacco History Reviewed. Non-smoker (former) Alcohol: No concerns, no excessive use Exercise Habits: Some activity, rec at least 30 mins 5 times a week STD concerns: none Drug Use: None Birth control method: n/a Menses regular: n/a Lumps or breast concerns: no Breast Cancer Family History: no  Health Maintenance  Topic Date Due  . Tetanus/tdap  05/10/2012  . Influenza Vaccine  04/20/2014  . Colonoscopy  04/29/2015 (Originally 12/09/2012)  . Mammogram  04/28/2016  . Pap Smear  04/28/2017   Immunization History  Administered Date(s) Administered  . Influenza Whole 07/04/2008, 06/18/2009, 08/27/2010  . Influenza-Unspecified 06/27/2012  . Td 05/10/2002   HTN: Tolerating all medications without side effects Stable and at goal No CP, no sob. No HA.  BP Readings from Last 3 Encounters:  04/24/14 150/100  03/12/13 120/74  12/11/12 154/92   Some compulsive ticks.   Lipids: She has always declined meds Panel reviewed with patient.  Lipids:    Component Value Date/Time   CHOL 242* 03/12/2013 0928   TRIG 246.0* 03/12/2013 0928   HDL 43.80 03/12/2013 0928   LDLDIRECT 200.0 04/24/2014 1538   VLDL 49.2* 03/12/2013 0928   CHOLHDL 6 03/12/2013 0928    Lab Results  Component Value Date   ALT 23 04/24/2014   AST 25 04/24/2014   ALKPHOS 50 04/24/2014   BILITOT 0.5 04/24/2014     Pap, 2014 Mammo, 2014 Colon:  declines IFOB.   BP Readings from Last 3 Encounters:  04/24/14 150/100  03/12/13 120/74    12/11/12 154/92    Wt Readings from Last 3 Encounters:  04/24/14 193 lb 12 oz (87.884 kg)  03/12/13 198 lb 2 oz (89.869 kg)  12/11/12 203 lb 12 oz (92.42 kg)   Anxiety, doing ok most of the time. Taperred off of her ativan tabs.  Patient Active Problem List   Diagnosis Date Noted  . Obesity (BMI 30-39.9) 04/28/2014  . INTERSTITIAL LUNG DISEASE 08/27/2010  . LOW BACK PAIN, ACUTE 08/12/2009  . Former tobacco use 08/28/2008  . HYPERLIPIDEMIA 06/21/2007  . ANXIETY 06/05/2007  . HYPERTENSION 06/05/2007  . ALLERGIC RHINITIS 06/05/2007   Past Medical History  Diagnosis Date  . Pollen allergies   . Interstitial lung disease   . Anxiety   . Hyperlipidemia   . Hypertension   . Squamous cell carcinoma 2014    skin cancer   Past Surgical History  Procedure Laterality Date  . Cosmetic surgery    . Tubal ligation     History   Social History  . Marital Status: Married    Spouse Name: N/A    Number of Children: 1  . Years of Education: N/A   Occupational History  . house wife    Social History Main Topics  .  Smoking status: Former Research scientist (life sciences)  . Smokeless tobacco: Never Used  . Alcohol Use: No  . Drug Use: No  . Sexual Activity: Not on file   Other Topics Concern  . Not on file   Social History Narrative   Regular exercise-no   Family History  Problem Relation Age of Onset  . Anxiety disorder Mother   . Hypertension Mother   . Cancer Maternal Aunt     breast  . Heart failure Maternal Grandfather    Allergies  Allergen Reactions  . Codeine     REACTION: hives, itching  . Oxycodone-Acetaminophen     REACTION: unspecified  . Paroxetine   . Sulfonamide Derivatives     REACTION: unspecified    Medication list has been reviewed and updated.  Review of Systems:   General: Denies fever, chills, sweats. No significant weight loss. Eyes: Denies blurring,significant itching ENT: Denies earache, sore throat, and hoarseness.  Cardiovascular: Denies chest pains,  palpitations, dyspnea on exertion,  Respiratory: Denies cough, dyspnea at rest,wheeezing Breast: no concerns about lumps GI: Denies nausea, vomiting, diarrhea, constipation, change in bowel habits, abdominal pain, melena, hematochezia GU: Denies dysuria, hematuria, urinary hesitancy, nocturia, denies STD risk, no concerns about discharge Musculoskeletal: Denies back pain, joint pain Derm: Denies rash, itching Neuro: Denies  paresthesias, frequent falls, frequent headaches Psych: Denies depression, anxiety Endocrine: Denies cold intolerance, heat intolerance, polydipsia Heme: Denies enlarged lymph nodes Allergy: No hayfever  Objective:   Physical Examination: BP 150/100  Pulse 93  Temp(Src) 98.1 F (36.7 C) (Oral)  Ht _0  (1.6 m)  Wt 193 lb 12 oz (87.884 kg)  BMI 34.33 kg/m2  No exam data present  GEN: well developed, well nourished, no acute distress Eyes: conjunctiva and lids normal, PERRLA, EOMI ENT: TM clear, nares clear, oral exam WNL Neck: supple, no lymphadenopathy, no thyromegaly, no JVD Pulm: clear to auscultation and percussion, respiratory effort normal CV: regular rate and rhythm, S1-S2, no murmur, rub or gallop, no bruits Chest: no scars, masses, no lumps BREAST: breast exam declined GI: soft, non-tender; no hepatosplenomegaly, masses; active bowel sounds all quadrants GU: GU exam declined Lymph: no cervical, axillary or inguinal adenopathy MSK: gait normal, muscle tone and strength WNL, no joint swelling, effusions, discoloration, crepitus  SKIN: clear, good turgor, color WNL, no rashes, lesions, or ulcerations Neuro: normal mental status, normal strength, sensation, and motion Psych: alert; oriented to person, place and time, normally interactive and not anxious or depressed in appearance.   All labs reviewed with patient. Lipids:    Component Value Date/Time   CHOL 242* 03/12/2013 0928   TRIG 246.0* 03/12/2013 0928   HDL 43.80 03/12/2013 0928    LDLDIRECT 200.0 04/24/2014 1538   VLDL 49.2* 03/12/2013 0928   CHOLHDL 6 03/12/2013 0928   CBC: CBC Latest Ref Rng 04/24/2014 03/12/2013 03/08/2012  WBC 4.0 - 10.5 K/uL 6.8 17.2(H) 6.6  Hemoglobin 12.0 - 15.0 g/dL 15.1(H) 14.8 14.5  Hematocrit 36.0 - 46.0 % 44.1 42.9 43.5  Platelets 150.0 - 400.0 K/uL 266.0 321.0 829.9    Basic Metabolic Panel:    Component Value Date/Time   NA 138 04/24/2014 1538   K 4.0 04/24/2014 1538   CL 100 04/24/2014 1538   CO2 26 04/24/2014 1538   BUN 11 04/24/2014 1538   CREATININE 0.7 04/24/2014 1538   GLUCOSE 86 04/24/2014 1538   CALCIUM 9.9 04/24/2014 1538   Hepatic Function Latest Ref Rng 04/24/2014 03/12/2013 03/08/2012  Total Protein 6.0 - 8.3 g/dL 8.0  8.1 7.8  Albumin 3.5 - 5.2 g/dL 4.8 4.5 4.5  AST 0 - 37 U/L 25 19 32  ALT 0 - 35 U/L _0 Alk Phosphatase 39 - 117 U/L 50 47 47  Total Bilirubin 0.2 - 1.2 mg/dL 0.5 0.6 0.6  Bilirubin, Direct 0.0 - 0.3 mg/dL 0.0 0.0 0.1    Lab Results  Component Value Date   TSH 0.19* 04/24/2014    No results found.  Assessment & Plan:   Routine general medical examination at a health care facility  [place]: LDL cholesterol, direct  HYPERTENSION: she does not want to go on any more medicine. Recheck 6 mo.  Encounter for long-term (current) use of other medications - Plan: Basic metabolic panel, Hemoglobin A1c, CBC with Differential, Hepatic function panel  Special screening for malignant neoplasms, colon - Plan: Fecal occult blood, imunochemical, CANCELED: Fecal occult blood, imunochemical  Other malaise and fatigue - Plan: TSH  Obesity (BMI 30-39.9)  ANXIETY  Health Maintenance Exam: The patient's preventative maintenance and recommended screening tests for an annual wellness exam were reviewed in full today. Brought up to date unless services declined.  Counselled on the importance of diet, exercise, and its role in overall health and mortality. The patient's FH and SH was reviewed, including their  home life, tobacco status, and drug and alcohol status.  Follow-up: No Follow-up on file. Or follow-up in 1 year for complete physical examination  New Prescriptions   No medications on file   Discontinued Medications   No medications on file   Modified Medications   Modified Medication Previous Medication   LORAZEPAM (ATIVAN) 0.5 MG TABLET LORazepam (ATIVAN) 0.5 MG tablet      1 po prior to procedure as directed    TAKE 1 TABLET BY MOUTH EVERY NIGHT AT BEDTIME AS NEEDED FOR ANXIETY   Signed,  Orlyn Odonoghue T. Kiira Brach, MD, [facility]  Orders Placed This Encounter  Procedures  . Fecal occult blood, imunochemical  . Basic metabolic panel  . Hemoglobin A1c  . CBC with Differential  . LDL cholesterol, direct  . TSH  . Hepatic function panel   Current Medications at Discharge:   Medication List       This list is accurate as of: 04/24/14 11:59 PM.  Always use your most recent med list.               hydrochlorothiazide 25 MG tablet  Commonly known as:  HYDRODIURIL  TAKE 1 TABLET (25 MG TOTAL) BY MOUTH DAILY.     LORazepam 0.5 MG tablet  Commonly known as:  ATIVAN  1 po prior to procedure as directed

## 2014-04-24 NOTE — Patient Instructions (Signed)
Presciption: Eat well and lose weight. Get exercise. For HIGH BLOOD PRESSURE.

## 2014-04-25 ENCOUNTER — Telehealth: Payer: Self-pay | Admitting: Family Medicine

## 2014-04-25 LAB — HEPATIC FUNCTION PANEL
ALT: 23 U/L (ref 0–35)
AST: 25 U/L (ref 0–37)
Albumin: 4.8 g/dL (ref 3.5–5.2)
Alkaline Phosphatase: 50 U/L (ref 39–117)
Bilirubin, Direct: 0 mg/dL (ref 0.0–0.3)
Total Bilirubin: 0.5 mg/dL (ref 0.2–1.2)
Total Protein: 8 g/dL (ref 6.0–8.3)

## 2014-04-25 LAB — CBC WITH DIFFERENTIAL/PLATELET
Basophils Absolute: 0.1 10*3/uL (ref 0.0–0.1)
Basophils Relative: 0.8 % (ref 0.0–3.0)
Eosinophils Absolute: 0.1 10*3/uL (ref 0.0–0.7)
Eosinophils Relative: 1.2 % (ref 0.0–5.0)
HCT: 44.1 % (ref 36.0–46.0)
Hemoglobin: 15.1 g/dL — ABNORMAL HIGH (ref 12.0–15.0)
Lymphocytes Relative: 44 % (ref 12.0–46.0)
Lymphs Abs: 3 10*3/uL (ref 0.7–4.0)
MCHC: 34.2 g/dL (ref 30.0–36.0)
MCV: 91.3 fl (ref 78.0–100.0)
Monocytes Absolute: 0.5 10*3/uL (ref 0.1–1.0)
Monocytes Relative: 6.8 % (ref 3.0–12.0)
Neutro Abs: 3.2 10*3/uL (ref 1.4–7.7)
Neutrophils Relative %: 47.2 % (ref 43.0–77.0)
Platelets: 266 10*3/uL (ref 150.0–400.0)
RBC: 4.83 Mil/uL (ref 3.87–5.11)
RDW: 12.4 % (ref 11.5–15.5)
WBC: 6.8 10*3/uL (ref 4.0–10.5)

## 2014-04-25 LAB — BASIC METABOLIC PANEL
BUN: 11 mg/dL (ref 6–23)
CO2: 26 mEq/L (ref 19–32)
Calcium: 9.9 mg/dL (ref 8.4–10.5)
Chloride: 100 mEq/L (ref 96–112)
Creatinine, Ser: 0.7 mg/dL (ref 0.4–1.2)
GFR: 93.62 mL/min (ref >60.00)
Glucose, Bld: 86 mg/dL (ref 70–99)
Potassium: 4 mEq/L (ref 3.5–5.1)
Sodium: 138 mEq/L (ref 135–145)

## 2014-04-25 LAB — HEMOGLOBIN A1C: Hgb A1c MFr Bld: 5.6 % (ref 4.6–6.5)

## 2014-04-25 LAB — TSH: TSH: 0.19 u[IU]/mL — ABNORMAL LOW (ref 0.35–4.50)

## 2014-04-25 LAB — LDL CHOLESTEROL, DIRECT: Direct LDL: 200 mg/dL

## 2014-04-25 NOTE — Telephone Encounter (Signed)
Relevant patient education assigned to patient using Emmi. ° °

## 2014-04-28 DIAGNOSIS — E669 Obesity, unspecified: Secondary | ICD-10-CM | POA: Insufficient documentation

## 2014-04-28 LAB — HM MAMMOGRAPHY

## 2014-05-01 NOTE — Addendum Note (Signed)
Addended by: Damita Lack on: 05/01/2014 06:46 PM   Modules accepted: Orders

## 2014-05-02 ENCOUNTER — Other Ambulatory Visit: Payer: Self-pay | Admitting: Family Medicine

## 2014-05-02 DIAGNOSIS — E039 Hypothyroidism, unspecified: Secondary | ICD-10-CM

## 2014-05-30 ENCOUNTER — Other Ambulatory Visit (INDEPENDENT_AMBULATORY_CARE_PROVIDER_SITE_OTHER): Payer: BC Managed Care – PPO

## 2014-05-30 ENCOUNTER — Encounter: Payer: Self-pay | Admitting: *Deleted

## 2014-05-30 DIAGNOSIS — E039 Hypothyroidism, unspecified: Secondary | ICD-10-CM

## 2014-05-30 LAB — T3, FREE: T3, Free: 3.5 pg/mL (ref 2.3–4.2)

## 2014-05-30 LAB — T4, FREE: Free T4: 0.91 ng/dL (ref 0.60–1.60)

## 2014-05-30 LAB — TSH: TSH: 0.21 u[IU]/mL — ABNORMAL LOW (ref 0.35–4.50)

## 2014-05-30 NOTE — Addendum Note (Signed)
Addended by: Baldomero Lamy on: 05/30/2014 02:19 PM   Modules accepted: Orders, Medications

## 2014-06-04 ENCOUNTER — Telehealth: Payer: Self-pay

## 2014-06-04 NOTE — Telephone Encounter (Signed)
Pt left v/m requesting results of 05/30/14 labs.Please advise.

## 2014-06-05 ENCOUNTER — Other Ambulatory Visit: Payer: Self-pay | Admitting: Family Medicine

## 2014-06-05 DIAGNOSIS — E0590 Thyrotoxicosis, unspecified without thyrotoxic crisis or storm: Secondary | ICD-10-CM

## 2014-06-05 NOTE — Telephone Encounter (Signed)
See result note.  

## 2014-06-07 ENCOUNTER — Other Ambulatory Visit: Payer: Self-pay

## 2014-06-07 MED ORDER — HYDROCHLOROTHIAZIDE 25 MG PO TABS: ORAL_TABLET | ORAL | Status: DC

## 2014-06-07 NOTE — Telephone Encounter (Signed)
Pt left v/m requesting refill HCTZ to [facility];  Refill done; pt did not leave contact # and no answer at only # on pt contact list.

## 2014-06-17 ENCOUNTER — Ambulatory Visit (INDEPENDENT_AMBULATORY_CARE_PROVIDER_SITE_OTHER): Payer: BC Managed Care – PPO | Admitting: Internal Medicine

## 2014-06-17 ENCOUNTER — Encounter: Payer: Self-pay | Admitting: Internal Medicine

## 2014-06-17 VITALS — BP 158/100 | HR 84 | Temp 98.6°F | Resp 12 | Ht 63.0 in | Wt 192.0 lb

## 2014-06-17 DIAGNOSIS — E0590 Thyrotoxicosis, unspecified without thyrotoxic crisis or storm: Secondary | ICD-10-CM | POA: Insufficient documentation

## 2014-06-17 HISTORY — DX: Thyrotoxicosis, unspecified without thyrotoxic crisis or storm: E05.90

## 2014-06-17 NOTE — Patient Instructions (Signed)
You have subclinical hyperthyroidism. Pease come back for labs in 2 months. I will send you the results through MyChart. Please come back for a follow-up appointment in 6 months.

## 2014-06-17 NOTE — Progress Notes (Signed)
Patient ID: Anne Rush, female   DOB: 29-Sep-1960, 53 y.o.   MRN: [id number]   HPI  Anne Rush is a 53 y.o.-year-old female, referred by her PCP, Dr.Copland, in consultation for subclinical hyperthyroidism.  She had an APE in 04/24/2014 >> TSH low. The TFTs were repeated in 1 mo, TSH still low:  I reviewed pt's thyroid tests: Lab Results  Component Value Date   TSH 0.21* 05/30/2014   TSH 0.19* 04/24/2014   TSH 0.54 03/12/2013   TSH 1.13 03/08/2012   TSH 1.60 12/16/2009   Last set of TFTs: Component     Latest Ref Rng 05/30/2014  TSH     0.35 - 4.50 uIU/mL 0.21 (L)  Free T4     0.60 - 1.60 ng/dL 4.09  T3, Free     2.3 - 4.2 pg/mL 3.5   Pt denies feeling nodules in neck, hoarseness, dysphagia/odynophagia, SOB with lying down; she c/o: - no excessive sweating/heat intolerance, but always has been sensitive to heat - no tremors - + anxiety - not new - no palpitations - ? If more fatigued - + weight loss (10 lbs in the last year - intentional) - no problems with concentration - no hyperdefecation or constipation  Pt does have a FH of thyroid ds (MGM - hypothyroidism). No FH of thyroid cancer. No h/o radiation tx to head or neck.  No seaweed or kelp, no recent contrast studies. No steroid use. No herbal supplements.   I reviewed her chart and she also has a history of ILD - resolved, HTN, HL, anxiety.   ROS: Constitutional: no weight gain/loss, no fatigue, + occasionally subjective hyperthermia/hypothermia Eyes: no blurry vision, no xerophthalmia ENT: no sore throat, no nodules palpated in throat, no dysphagia/odynophagia, no hoarseness Cardiovascular: + CP (upper abd.)/SOB/palpitations/+ hands and feet swelling Respiratory: no cough/SOB Gastrointestinal: no N/V/D/C Musculoskeletal: + muscle joint/+ joint aches Skin: no rashes, + easy bruising Neurological: no tremors/numbness/tingling/dizziness Psychiatric: no depression/+ anxiety  Past Medical History  Diagnosis Date   . Pollen allergies   . Interstitial lung disease   . Anxiety   . Hyperlipidemia   . Hypertension   . Squamous cell carcinoma 2014    skin cancer   Past Surgical History  Procedure Laterality Date  . Cosmetic surgery    . Tubal ligation     History   Social History  . Marital Status: Married    Spouse Name: N/A    Number of Children: 1   Occupational History  . Office-accts receivable Part time   Social History Main Topics  . Smoking status: Former Games developer, quit in 2012  . Smokeless tobacco: Never Used  . Alcohol Use: No  . Drug Use: No   Social History Narrative   Regular exercise-no   Current Outpatient Prescriptions on File Prior to Visit  Medication Sig Dispense Refill  . hydrochlorothiazide (HYDRODIURIL) 25 MG tablet TAKE 1 TABLET (25 MG TOTAL) BY MOUTH DAILY.  90 tablet  1   No current facility-administered medications on file prior to visit.   Allergies  Allergen Reactions  . Codeine     REACTION: hives, itching  . Oxycodone-Acetaminophen     REACTION: unspecified  . Paroxetine   . Sulfonamide Derivatives     REACTION: unspecified   Family History  Problem Relation Age of Onset  . Anxiety disorder Mother   . Hypertension Mother   . Cancer Maternal Aunt     breast  . Heart failure Maternal Grandfather  PE: BP 158/100  Pulse 84  Temp(Src) 98.6 F (37 C) (Oral)  Resp 12  Ht 5\' 3"  (1.6 m)  Wt 192 lb (87.091 kg)  BMI 34.02 kg/m2 Wt Readings from Last 3 Encounters:  06/17/14 192 lb (87.091 kg)  04/24/14 193 lb 12 oz (87.884 kg)  03/12/13 198 lb 2 oz (89.869 kg)   Constitutional: overweight, in NAD Eyes: PERRLA, EOMI, no exophthalmos, no lid lag, no stare ENT: moist mucous membranes, no thyromegaly, no thyroid bruits, no cervical lymphadenopathy Cardiovascular: RRR, No MRG Respiratory: CTA B Gastrointestinal: abdomen soft, NT, ND, BS+ Musculoskeletal: no deformities, strength intact in all 4 Skin: moist, warm, no rashes Neurological: no  tremor with outstretched hands, DTR normal in all 4  ASSESSMENT: 1. [place] hyperthyroidism  PLAN:  1. Patient with a recently found mildly low TSH, without thyrotoxic sxs. - she does not appear to have exogenous causes for the low TSH.  - We discussed that possible causes of thyrotoxicosis are:  [place] ds   Thyroiditis toxic multinodular goiter/ toxic adenoma (I cannot feel nodules at palpation of her thyroid). - we discussed about the fact that subclinical hyperthyroidism is a mild version of overt hyperthyroidism, and usually this is not treated, especially if patient does not have symptoms. However, this can herald the development of overt hyperthyroidism, therefore, we need to keep an eye on her thyroid tests from now on - I suggested that we check the TSH, fT3 and fT4 in 2 months from now and decide about further plan depending on the results - If the tests worsen, we will likely need to obtain an uptake and scan to differentiate between the 3 above possible etiologies  - we discussed about possible modalities of treatment for the above conditions, to include methimazole use, radioactive iodine ablation or surgery. - I do not feel that we need to add beta blockers at this time, since she is not tachycardic, anxious, or tremulous - RTC in 6 months, but sooner for repeat labs. She was advised to get in touch with me if she develops hyperthyroid symptoms. She agrees with the plan.  Pulm: Dr Carney Bern, [facility]

## 2014-07-31 ENCOUNTER — Other Ambulatory Visit (INDEPENDENT_AMBULATORY_CARE_PROVIDER_SITE_OTHER): Payer: BC Managed Care – PPO

## 2014-07-31 DIAGNOSIS — E0590 Thyrotoxicosis, unspecified without thyrotoxic crisis or storm: Secondary | ICD-10-CM

## 2014-07-31 LAB — T3, FREE: T3, Free: 3.3 pg/mL (ref 2.3–4.2)

## 2014-07-31 LAB — TSH: TSH: 1.06 u[IU]/mL (ref 0.35–4.50)

## 2014-07-31 LAB — T4, FREE: Free T4: 0.92 ng/dL (ref 0.60–1.60)

## 2014-09-06 ENCOUNTER — Other Ambulatory Visit: Payer: Self-pay | Admitting: Family Medicine

## 2014-10-28 ENCOUNTER — Encounter: Payer: Self-pay | Admitting: Family Medicine

## 2014-10-28 ENCOUNTER — Ambulatory Visit (INDEPENDENT_AMBULATORY_CARE_PROVIDER_SITE_OTHER): Payer: BLUE CROSS/BLUE SHIELD | Admitting: Family Medicine

## 2014-10-28 VITALS — BP 158/90 | HR 112 | Temp 98.5°F | Ht 63.0 in | Wt 189.8 lb

## 2014-10-28 DIAGNOSIS — E785 Hyperlipidemia, unspecified: Secondary | ICD-10-CM

## 2014-10-28 DIAGNOSIS — R635 Abnormal weight gain: Secondary | ICD-10-CM

## 2014-10-28 DIAGNOSIS — Z23 Encounter for immunization: Secondary | ICD-10-CM

## 2014-10-28 DIAGNOSIS — I10 Essential (primary) hypertension: Secondary | ICD-10-CM

## 2014-10-28 MED ORDER — AMLODIPINE BESYLATE 5 MG PO TABS: 5.0000 mg | ORAL_TABLET | Freq: Every day | ORAL | Status: DC

## 2014-10-28 NOTE — Progress Notes (Signed)
Dr. Karleen Hampshire T. Shadow Stiggers, MD, [facility] [address] [place], [address] Phone: [phone] Fax: [phone]  10/28/2014  Patient: Anne Rush, MRN: [id number], DOB: [birth year], 54 y.o.  Primary Physician:  [name], MD  Chief Complaint: Follow-up  Subjective:   Anne Rush is a 54 y.o. very pleasant female patient who presents with the following:  HTN: Tolerating all medications without side effects Not at goal No CP, no sob. No HA.  BP Readings from Last 3 Encounters:  10/28/14 158/90  06/17/14 158/100  04/24/14 150/100    Basic Metabolic Panel:    Component Value Date/Time   NA 138 04/24/2014 1538   K 4.0 04/24/2014 1538   CL 100 04/24/2014 1538   CO2 26 04/24/2014 1538   BUN 11 04/24/2014 1538   CREATININE 0.7 04/24/2014 1538   GLUCOSE 86 04/24/2014 1538   CALCIUM 9.9 04/24/2014 1538   The patient and her family are very well-known to me.  She has been resistant to multiple medical interventions in the past including initially initiating blood pressure medications.  She is still highly resistant to going up on her medication today.  She also has hyperlipidemia, and she will make medication for this.  She also has gained some significant amount of weight.  She also declines all recommendations for colon cancer screening including colonoscopy.  Patient also has not had a mammogram in more than 5 years, and is reluctant to do Do so, but she thinks that she will do so this year.  Mammo - [facility].   6 months - 20 pounds  Past Medical History, Surgical History, Social History, Family History, Problem List, Medications, and Allergies have been reviewed and updated if relevant.   GEN: No acute illnesses, no fevers, chills. GI: No n/v/d, eating normally Pulm: No SOB Interactive and getting along well at home.  Otherwise, ROS is as per the HPI.  Objective:   BP 158/90 mmHg  Pulse 112   Temp(Src) 98.5 F (36.9 C) (Oral)  Ht 5\' 3"  (1.6 m)  Wt 189 lb 12 oz (86.07 kg)  BMI 33.62 kg/m2  GEN: WDWN, NAD, Non-toxic, A & O x 3 HEENT: Atraumatic, Normocephalic. Neck supple. No masses, No LAD. Ears and Nose: No external deformity. CV: RRR, No M/G/R. No JVD. No thrill. No extra heart sounds. PULM: CTA B, no wheezes, crackles, rhonchi. No retractions. No resp. distress. No accessory muscle use. EXTR: No c/c/e NEURO Normal gait.  PSYCH: Normally interactive. Conversant. Not depressed or anxious appearing.  Calm demeanor.   Laboratory and Imaging Data: Results for orders placed or performed in visit on 07/31/14  TSH  Result Value Ref Range   TSH 1.06 0.35 - 4.50 uIU/mL  T4, free  Result Value Ref Range   Free T4 0.92 0.60 - 1.60 ng/dL  T3, free  Result Value Ref Range   T3, Free 3.3 2.3 - 4.2 pg/mL     Assessment and Plan:   Essential hypertension  Need for prophylactic vaccination with combined diphtheria-tetanus-pertussis (DTP) vaccine - Plan: Tdap vaccine greater than or equal to 7yo IM  Hyperlipidemia LDL goal <100  Weight gain  We will do our best.  Compliance an issue with recommendations.  I think that I convinced her to start some low-dose amlodipine given her fairly high blood pressure.  She also is had some weight gain.  She has a goal of losing 20 pounds in the next 6  months.  Follow-up: Return in about 6 months (around 04/28/2015) for CPX.  New Prescriptions   AMLODIPINE (NORVASC) 5 MG TABLET    Take 1 tablet (5 mg total) by mouth daily.   Orders Placed This Encounter  Procedures  . Tdap vaccine greater than or equal to 7yo IM    Signed,  Tashala Cumbo T. Fayette Gasner, MD   Patient's Medications  New Prescriptions   AMLODIPINE (NORVASC) 5 MG TABLET    Take 1 tablet (5 mg total) by mouth daily.  Previous Medications   HYDROCHLOROTHIAZIDE (HYDRODIURIL) 25 MG TABLET    TAKE 1 TABLET (25 MG TOTAL) BY MOUTH DAILY.  Modified Medications   No medications  on file  Discontinued Medications   PSEUDOEPHEDRINE (SUDAFED) 30 MG TABLET    Take 30 mg by mouth daily as needed for congestion.

## 2014-10-28 NOTE — Progress Notes (Signed)
Pre visit review using our clinic review tool, if applicable. No additional management support is needed unless otherwise documented below in the visit note. 

## 2014-12-16 ENCOUNTER — Ambulatory Visit: Payer: BC Managed Care – PPO | Admitting: Internal Medicine

## 2015-01-02 ENCOUNTER — Other Ambulatory Visit: Payer: Self-pay | Admitting: Family Medicine

## 2015-02-12 ENCOUNTER — Telehealth: Payer: Self-pay | Admitting: Family Medicine

## 2015-02-12 ENCOUNTER — Encounter: Payer: Self-pay | Admitting: Family Medicine

## 2015-02-12 MED ORDER — AMOXICILLIN-POT CLAVULANATE 875-125 MG PO TABS: 1.0000 | ORAL_TABLET | Freq: Two times a day (BID) | ORAL | Status: DC

## 2015-02-12 NOTE — Telephone Encounter (Signed)
[place] notified Augmentin prescription has been sent to [facility] [address], [place].  See MyChart Message.

## 2015-02-12 NOTE — Telephone Encounter (Signed)
augmentin 875 mg, 1 po bid, #20

## 2015-02-12 NOTE — Telephone Encounter (Signed)
Pt called wanting an appt, when I said that dr copland didn't have anything available tomorrow, she immeaditely asked for you to call her.Please call her at [phone], thanks

## 2015-03-19 ENCOUNTER — Encounter: Payer: Self-pay | Admitting: Family Medicine

## 2015-04-09 ENCOUNTER — Telehealth: Payer: Self-pay

## 2015-04-09 NOTE — Telephone Encounter (Signed)
Called patient to notify her of being due for a Mammogram. Patient declined.

## 2015-04-10 ENCOUNTER — Encounter: Payer: Self-pay | Admitting: Family Medicine

## 2015-04-28 ENCOUNTER — Encounter: Payer: Self-pay | Admitting: Family Medicine

## 2015-04-28 ENCOUNTER — Ambulatory Visit (INDEPENDENT_AMBULATORY_CARE_PROVIDER_SITE_OTHER): Payer: BLUE CROSS/BLUE SHIELD | Admitting: Family Medicine

## 2015-04-28 VITALS — BP 146/92 | HR 92 | Temp 97.6°F | Ht 63.0 in | Wt 187.0 lb

## 2015-04-28 DIAGNOSIS — Z79899 Other long term (current) drug therapy: Secondary | ICD-10-CM

## 2015-04-28 DIAGNOSIS — E785 Hyperlipidemia, unspecified: Secondary | ICD-10-CM | POA: Diagnosis not present

## 2015-04-28 DIAGNOSIS — Z0000 Encounter for general adult medical examination without abnormal findings: Secondary | ICD-10-CM

## 2015-04-28 DIAGNOSIS — E0590 Thyrotoxicosis, unspecified without thyrotoxic crisis or storm: Secondary | ICD-10-CM | POA: Diagnosis not present

## 2015-04-28 DIAGNOSIS — M2550 Pain in unspecified joint: Secondary | ICD-10-CM

## 2015-04-28 DIAGNOSIS — R5383 Other fatigue: Secondary | ICD-10-CM

## 2015-04-28 NOTE — Progress Notes (Signed)
Dr. Frederico Hamman T. Jeda Pardue, MD, [facility] [place], [address] Phone: [phone] Fax: [phone]  04/28/2015  Patient: Anne Rush, MRN: [id number], DOB: 1960/12/17, 54 y.o.  Primary Physician:  Owens Loffler, MD  Chief Complaint: Annual Exam  Subjective:   Anne Rush is a 54 y.o. pleasant patient who presents with the following:  Health Maintenance Summary Reviewed and updated, unless pt declines services.  Tobacco History Reviewed. Non-smoker Alcohol: No concerns, no excessive use Exercise Habits: no activity, rec at least 30 mins 5 times a week STD concerns: none Drug Use: None Birth control method: n/a Menses regular: no Lumps or breast concerns: no Breast Cancer Family History: maternal aunt  L eye doctor, 2x, Dr. Wallace Going at [facility].  L eye a little puffier.  Sees Dr. Gloriann Loan in [place].   Mammo - f/u with Dr. Laurey Morale.  Pap  No colonoscopy Ok stool test  BP normal at home.  Health Maintenance  Topic Date Due  . Hepatitis C Screening  13-Oct-1960  . HIV Screening  12/09/1977  . INFLUENZA VACCINE  04/21/2015  . COLONOSCOPY  04/27/2016 (Originally 12/09/2012)  . MAMMOGRAM  04/28/2016  . PAP SMEAR  04/28/2017  . TETANUS/TDAP  10/28/2024    Immunization History  Administered Date(s) Administered  . Influenza Whole 07/04/2008, 06/18/2009, 08/27/2010  . Influenza-Unspecified 06/27/2012, 07/17/2014  . Td 05/10/2002  . Tdap 10/28/2014   Patient Active Problem List   Diagnosis Date Noted  . Subclinical hyperthyroidism 06/17/2014  . Obesity (BMI 30-39.9) 04/28/2014  . INTERSTITIAL LUNG DISEASE 08/27/2010  . LOW BACK PAIN, ACUTE 08/12/2009  . Former tobacco use 08/28/2008  . Hyperlipidemia LDL goal <100 06/21/2007  . ANXIETY 06/05/2007  . Essential hypertension 06/05/2007  . ALLERGIC RHINITIS 06/05/2007   Past Medical History  Diagnosis Date  . Pollen allergies   . Interstitial  lung disease   . Anxiety   . Hyperlipidemia   . Hypertension   . Squamous cell carcinoma 2014    skin cancer  . Subclinical hyperthyroidism 06/17/2014   Past Surgical History  Procedure Laterality Date  . Cosmetic surgery    . Tubal ligation     History   Social History  . Marital Status: Married    Spouse Name: N/A  . Number of Children: 1  . Years of Education: N/A   Occupational History  . house wife    Social History Main Topics  . Smoking status: Former Research scientist (life sciences)  . Smokeless tobacco: Never Used  . Alcohol Use: No  . Drug Use: No  . Sexual Activity: Not on file   Other Topics Concern  . Not on file   Social History Narrative   Regular exercise-no   Family History  Problem Relation Age of Onset  . Anxiety disorder Mother   . Hypertension Mother   . Cancer Maternal Aunt     breast  . Heart failure Maternal Grandfather    Allergies  Allergen Reactions  . Codeine     REACTION: hives, itching  . Oxycodone-Acetaminophen     REACTION: unspecified  . Paroxetine   . Sulfonamide Derivatives     REACTION: unspecified    Medication list has been reviewed and updated.   General: Denies fever, chills, sweats. No significant weight loss. Eyes: eyes above ENT: Denies earache, sore throat, and hoarseness.  Cardiovascular: Denies chest pains, palpitations, dyspnea on exertion,  Respiratory: Denies cough, dyspnea at rest,wheeezing Breast: no concerns  about lumps GI: Denies nausea, vomiting, diarrhea, constipation, change in bowel habits, abdominal pain, melena, hematochezia GU: Denies dysuria, hematuria, urinary hesitancy, nocturia, denies STD risk, no concerns about discharge Musculoskeletal: Denies back pain, joint pain Derm: Denies rash, itching Neuro: Denies  paresthesias, frequent falls, frequent headaches Psych: Denies depression, anxiety Endocrine: Denies cold intolerance, heat intolerance, polydipsia Heme: Denies enlarged lymph nodes Allergy: No  hayfever  Objective:   BP 146/92 mmHg  Pulse 92  Temp(Src) 97.6 F (36.4 C) (Oral)  Ht 5' 3"  (1.6 m)  Wt 187 lb (84.823 kg)  BMI 33.13 kg/m2 No exam data present  GEN: well developed, well nourished, no acute distress Eyes: conjunctiva and lids normal, PERRLA, EOMI ENT: TM clear, nares clear, oral exam WNL Neck: supple, no lymphadenopathy, no thyromegaly, no JVD Pulm: clear to auscultation and percussion, respiratory effort normal CV: regular rate and rhythm, S1-S2, no murmur, rub or gallop, no bruits Chest: no scars, masses, no lumps BREAST: breast exam declined GI: soft, non-tender; no hepatosplenomegaly, masses; active bowel sounds all quadrants GU: GU exam declined Lymph: no cervical, axillary or inguinal adenopathy MSK: gait normal, muscle tone and strength WNL, no joint swelling, effusions, discoloration, crepitus  SKIN: clear, good turgor, color WNL, no rashes, lesions, or ulcerations Neuro: normal mental status, normal strength, sensation, and motion Psych: alert; oriented to person, place and time, normally interactive and not anxious or depressed in appearance.   All labs reviewed with patient. Lipids:    Component Value Date/Time   CHOL 242* 03/12/2013 0928   TRIG 246.0* 03/12/2013 0928   HDL 43.80 03/12/2013 0928   LDLDIRECT 200.0 04/24/2014 1538   VLDL 49.2* 03/12/2013 0928   CHOLHDL 6 03/12/2013 0928   CBC: CBC Latest Ref Rng 04/24/2014 03/12/2013 03/08/2012  WBC 4.0 - 10.5 K/uL 6.8 17.2(H) 6.6  Hemoglobin 12.0 - 15.0 g/dL 15.1(H) 14.8 14.5  Hematocrit 36.0 - 46.0 % 44.1 42.9 43.5  Platelets 150.0 - 400.0 K/uL 266.0 321.0 812.7    Basic Metabolic Panel:    Component Value Date/Time   NA 138 04/24/2014 1538   K 4.0 04/24/2014 1538   CL 100 04/24/2014 1538   CO2 26 04/24/2014 1538   BUN 11 04/24/2014 1538   CREATININE 0.7 04/24/2014 1538   GLUCOSE 86 04/24/2014 1538   CALCIUM 9.9 04/24/2014 1538   Hepatic Function Latest Ref Rng 04/24/2014 03/12/2013  03/08/2012  Total Protein 6.0 - 8.3 g/dL 8.0 8.1 7.8  Albumin 3.5 - 5.2 g/dL 4.8 4.5 4.5  AST 0 - 37 U/L 25 19 32  ALT 0 - 35 U/L 23 21 25   Alk Phosphatase 39 - 117 U/L 50 47 47  Total Bilirubin 0.2 - 1.2 mg/dL 0.5 0.6 0.6  Bilirubin, Direct 0.0 - 0.3 mg/dL 0.0 0.0 0.1    Lab Results  Component Value Date   TSH 1.06 07/31/2014   No results found.  Assessment and Plan:   Healthcare maintenance  Hyperlipidemia LDL goal <100 - Plan: Lipid panel  Subclinical hyperthyroidism - Plan: TSH  Encounter for long-term (current) use of medications - Plan: Basic metabolic panel, CBC with Differential/Platelet, Hepatic function panel  Health Maintenance Exam: The patient's preventative maintenance and recommended screening tests for an annual wellness exam were reviewed in full today. Brought up to date unless services declined.  Counselled on the importance of diet, exercise, and its role in overall health and mortality. The patient's FH and SH was reviewed, including their home life, tobacco status, and drug and alcohol  status.  Declines chol meds Declines colonoscopy Declines vaccines Self d/c her norvasc, doesn't want to take it - only will take her 1 pill of hctz  Follow-up: No Follow-up on file. Or follow-up in 1 year for complete physical examination  New Prescriptions   No medications on file   Orders Placed This Encounter  Procedures  . Basic metabolic panel  . CBC with Differential/Platelet  . Hepatic function panel  . Lipid panel  . TSH    Signed,  Frederico Hamman T. Jerad Dunlap, MD   Patient's Medications  New Prescriptions   No medications on file  Previous Medications   HYDROCHLOROTHIAZIDE (HYDRODIURIL) 25 MG TABLET    TAKE 1 TABLET (25 MG TOTAL) BY MOUTH DAILY.  Modified Medications   No medications on file  Discontinued Medications   AMLODIPINE (NORVASC) 5 MG TABLET    Take 1 tablet (5 mg total) by mouth daily.   AMOXICILLIN-CLAVULANATE (AUGMENTIN) 875-125 MG PER  TABLET    Take 1 tablet by mouth 2 (two) times daily.

## 2015-04-28 NOTE — Patient Instructions (Signed)
EXAM: CT Chest without contrast  DATE: 04/15/15 09:42:06 ACCESSION: [id number] UN DICTATED: 04/15/15 12:49:36 INTERPRETATION LOCATION: [facility]  CLINICAL INDICATION: 54 Year Old (F): J84.10 - Pulmonary fibrosis (RAF-HCC).  COMPARISON: Chest radiograph 06/24/2014, chest CT 02/27/2013  TECHNIQUE: A spiral CT scan was obtained without IV contrast from the thoracic inlet through the hemidiaphragms. Images were reconstructed in the axial plane.  Coronal and sagittal reformatted images of the chest were also provided for further evaluation of the lung parenchyma.  FINDINGS:  There are no pathologically enlarged thoracic lymph nodes.   The heart and great vessels have an unremarkable unenhanced appearance. No pericardial effusion.  There is apical predominant centrilobular emphysema. There are scattered peripheral groundglass and reticular opacities with some cystic changes particularly affecting the upper lobes and superior segment of the right lower lobe. These findings are mildly progressed since 02/27/2013. Right upper lobe micronodules (2:21) is unchanged.   There is a peripherally calcified lesion in the superior aspect of the caudate lobe measuring approximately 1.2 cm (2:49), unchanged.   There are no lytic or blastic osseous lesions.  IMPRESSION: Mildly progressed upper lobe predominant fibrotic changes. Findings are most compatible with smoking related lung disease such as RB-ILD/DIP.

## 2015-04-28 NOTE — Progress Notes (Signed)
Pre visit review using our clinic review tool, if applicable. No additional management support is needed unless otherwise documented below in the visit note. 

## 2015-04-29 ENCOUNTER — Encounter: Payer: Self-pay | Admitting: Family Medicine

## 2015-05-05 NOTE — Addendum Note (Signed)
Addended by: [name] on: 05/05/2015 02:57 PM   Modules accepted: Orders, SmartSet

## 2015-05-12 ENCOUNTER — Encounter: Payer: Self-pay | Admitting: Family Medicine

## 2015-05-12 ENCOUNTER — Other Ambulatory Visit (INDEPENDENT_AMBULATORY_CARE_PROVIDER_SITE_OTHER): Payer: BLUE CROSS/BLUE SHIELD

## 2015-05-12 DIAGNOSIS — E785 Hyperlipidemia, unspecified: Secondary | ICD-10-CM | POA: Diagnosis not present

## 2015-05-12 DIAGNOSIS — Z79899 Other long term (current) drug therapy: Secondary | ICD-10-CM | POA: Diagnosis not present

## 2015-05-12 DIAGNOSIS — E0590 Thyrotoxicosis, unspecified without thyrotoxic crisis or storm: Secondary | ICD-10-CM

## 2015-05-12 DIAGNOSIS — R5383 Other fatigue: Secondary | ICD-10-CM

## 2015-05-12 DIAGNOSIS — M2550 Pain in unspecified joint: Secondary | ICD-10-CM | POA: Diagnosis not present

## 2015-05-12 LAB — TSH: TSH: 1.56 u[IU]/mL (ref 0.35–4.50)

## 2015-05-12 LAB — BASIC METABOLIC PANEL
BUN: 11 mg/dL (ref 6–23)
CO2: 28 mEq/L (ref 19–32)
Calcium: 9.7 mg/dL (ref 8.4–10.5)
Chloride: 102 mEq/L (ref 96–112)
Creatinine, Ser: 0.62 mg/dL (ref 0.40–1.20)
GFR: 107.26 mL/min (ref >60.00)
Glucose, Bld: 88 mg/dL (ref 70–99)
Potassium: 4.1 mEq/L (ref 3.5–5.1)
Sodium: 139 mEq/L (ref 135–145)

## 2015-05-12 LAB — CBC WITH DIFFERENTIAL/PLATELET
Basophils Absolute: 0 10*3/uL (ref 0.0–0.1)
Basophils Relative: 0.4 % (ref 0.0–3.0)
Eosinophils Absolute: 0.1 10*3/uL (ref 0.0–0.7)
Eosinophils Relative: 1.5 % (ref 0.0–5.0)
HCT: 43.5 % (ref 36.0–46.0)
Hemoglobin: 15.1 g/dL — ABNORMAL HIGH (ref 12.0–15.0)
Lymphocytes Relative: 43 % (ref 12.0–46.0)
Lymphs Abs: 2.4 10*3/uL (ref 0.7–4.0)
MCHC: 34.8 g/dL (ref 30.0–36.0)
MCV: 89.9 fl (ref 78.0–100.0)
Monocytes Absolute: 0.3 10*3/uL (ref 0.1–1.0)
Monocytes Relative: 6.1 % (ref 3.0–12.0)
Neutro Abs: 2.8 10*3/uL (ref 1.4–7.7)
Neutrophils Relative %: 49 % (ref 43.0–77.0)
Platelets: 228 10*3/uL (ref 150.0–400.0)
RBC: 4.84 Mil/uL (ref 3.87–5.11)
RDW: 12.7 % (ref 11.5–15.5)
WBC: 5.7 10*3/uL (ref 4.0–10.5)

## 2015-05-12 LAB — HEPATIC FUNCTION PANEL
ALT: 17 U/L (ref 0–35)
AST: 17 U/L (ref 0–37)
Albumin: 4.8 g/dL (ref 3.5–5.2)
Alkaline Phosphatase: 46 U/L (ref 39–117)
Bilirubin, Direct: 0.1 mg/dL (ref 0.0–0.3)
Total Bilirubin: 0.6 mg/dL (ref 0.2–1.2)
Total Protein: 7.5 g/dL (ref 6.0–8.3)

## 2015-05-12 LAB — LIPID PANEL
Cholesterol: 237 mg/dL — ABNORMAL HIGH (ref 0–200)
HDL: 49.6 mg/dL (ref >39.00)
LDL Cholesterol: 149 mg/dL — ABNORMAL HIGH (ref 0–99)
NonHDL: 187.71
Total CHOL/HDL Ratio: 5
Triglycerides: 195 mg/dL — ABNORMAL HIGH (ref 0.0–149.0)
VLDL: 39 mg/dL (ref 0.0–40.0)

## 2015-05-12 LAB — SEDIMENTATION RATE: Sed Rate: 10 mm/hr (ref 0–22)

## 2015-05-12 LAB — URIC ACID: Uric Acid, Serum: 6.2 mg/dL (ref 2.4–7.0)

## 2015-05-12 LAB — HIGH SENSITIVITY CRP: CRP, High Sensitivity: 0.73 mg/L (ref 0.000–5.000)

## 2015-05-13 LAB — CYCLIC CITRUL PEPTIDE ANTIBODY, IGG: Cyclic Citrullin Peptide Ab: <2 U/mL (ref 0.0–5.0)

## 2015-05-13 LAB — RHEUMATOID FACTOR: Rhuematoid fact SerPl-aCnc: <10 IU/mL (ref <=14)

## 2015-05-13 LAB — ANA: Anti Nuclear Antibody(ANA): NEGATIVE

## 2015-05-13 LAB — ANTI-SMITH ANTIBODY: ENA SM Ab Ser-aCnc: <1

## 2015-05-13 LAB — ANTI-DNA ANTIBODY, DOUBLE-STRANDED: ds DNA Ab: 1 IU/mL

## 2015-05-19 LAB — TISSUE TRANSGLUTAMINASE, IGG: Tissue Transglut Ab: 1 U/mL (ref <6)

## 2015-05-20 ENCOUNTER — Telehealth: Payer: Self-pay | Admitting: Family Medicine

## 2015-05-20 NOTE — Telephone Encounter (Signed)
Patient Name: Anne Rush DOB: 06/10/1961 Initial Comment Caller states she feels faint with difficulty breathing, xfer from office Nurse Assessment Nurse: Charna Elizabeth, RN, Lynden Ang Date/Time (Eastern Time): 05/20/2015 11:35:17 AM Confirm and document reason for call. If symptomatic, describe symptoms. ---Caller states she developed shortness of breath, dizziness and fast heart rate today. Has the patient traveled out of the country within the last 30 days? ---No Does the patient require triage? ---Yes Related visit to physician within the last 2 weeks? ---No Does the PT have any chronic conditions? (i.e. diabetes, asthma, etc.) ---Yes List chronic conditions. ---High Blood Pressure Did the patient indicate they were pregnant? ---No Guidelines Guideline Title Affirmed Question Affirmed Notes Breathing Difficulty [1] MODERATE difficulty breathing (e.g., speaks in phrases, SOB even at rest, pulse 100-120) AND [2] NEW-onset or WORSE than normal SOB even at rest Final Disposition User Go to ED Now Charna Elizabeth, RN, Cathy Referrals [facility] - ED Disagree/Comply: Comply

## 2015-05-22 ENCOUNTER — Other Ambulatory Visit: Payer: Self-pay

## 2015-05-22 ENCOUNTER — Encounter: Payer: Self-pay | Admitting: *Deleted

## 2015-05-22 ENCOUNTER — Emergency Department: Payer: BLUE CROSS/BLUE SHIELD

## 2015-05-22 ENCOUNTER — Emergency Department
Admission: EM | Admit: 2015-05-22 | Discharge: 2015-05-22 | Disposition: A | Discharge status: Home / Self Care | Payer: BLUE CROSS/BLUE SHIELD | Attending: Emergency Medicine | Admitting: Emergency Medicine

## 2015-05-22 DIAGNOSIS — R55 Syncope and collapse: Secondary | ICD-10-CM | POA: Insufficient documentation

## 2015-05-22 DIAGNOSIS — I10 Essential (primary) hypertension: Secondary | ICD-10-CM | POA: Diagnosis not present

## 2015-05-22 DIAGNOSIS — F419 Anxiety disorder, unspecified: Secondary | ICD-10-CM | POA: Diagnosis not present

## 2015-05-22 DIAGNOSIS — Z87891 Personal history of nicotine dependence: Secondary | ICD-10-CM | POA: Diagnosis not present

## 2015-05-22 DIAGNOSIS — R220 Localized swelling, mass and lump, head: Secondary | ICD-10-CM | POA: Diagnosis not present

## 2015-05-22 DIAGNOSIS — R000 Tachycardia, unspecified: Secondary | ICD-10-CM | POA: Insufficient documentation

## 2015-05-22 LAB — BASIC METABOLIC PANEL
Anion gap: 12 (ref 5–15)
BUN: 17 mg/dL (ref 6–20)
CO2: 27 mmol/L (ref 22–32)
Calcium: 10.1 mg/dL (ref 8.9–10.3)
Chloride: 100 mmol/L — ABNORMAL LOW (ref 101–111)
Creatinine, Ser: 0.75 mg/dL (ref 0.44–1.00)
GFR calc Af Amer: >60 mL/min (ref >60)
GFR calc non Af Amer: >60 mL/min (ref >60)
Glucose, Bld: 145 mg/dL — ABNORMAL HIGH (ref 65–99)
Potassium: 3.8 mmol/L (ref 3.5–5.1)
Sodium: 139 mmol/L (ref 135–145)

## 2015-05-22 LAB — CBC
HCT: 45 % (ref 35.0–47.0)
Hemoglobin: 15.4 g/dL (ref 12.0–16.0)
MCH: 30.4 pg (ref 26.0–34.0)
MCHC: 34.2 g/dL (ref 32.0–36.0)
MCV: 88.8 fL (ref 80.0–100.0)
Platelets: 257 10*3/uL (ref 150–440)
RBC: 5.07 MIL/uL (ref 3.80–5.20)
RDW: 12.5 % (ref 11.5–14.5)
WBC: 14 10*3/uL — ABNORMAL HIGH (ref 3.6–11.0)

## 2015-05-22 LAB — TROPONIN I: Troponin I: <0.03 ng/mL (ref <0.031)

## 2015-05-22 MED ORDER — DIAZEPAM 5 MG/ML IJ SOLN
5.0000 mg | Freq: Once | INTRAMUSCULAR | Status: AC
  Administered 2015-05-22: 5 mg via INTRAVENOUS
  Filled 2015-05-22: qty 2

## 2015-05-22 MED ORDER — SODIUM CHLORIDE 0.9 % IV SOLN
Freq: Once | INTRAVENOUS | Status: AC
  Administered 2015-05-22: 16:00:00 via INTRAVENOUS

## 2015-05-22 MED ORDER — DIAZEPAM 5 MG PO TABS: 5.0000 mg | ORAL_TABLET | Freq: Three times a day (TID) | ORAL | Status: DC | PRN

## 2015-05-22 NOTE — Discharge Instructions (Signed)
Near-Syncope Near-syncope (commonly known as near fainting) is sudden weakness, dizziness, or feeling like you might pass out. During an episode of near-syncope, you may also develop pale skin, have tunnel vision, or feel sick to your stomach (nauseous). Near-syncope may occur when getting up after sitting or while standing for a long time. It is caused by a sudden decrease in blood flow to the brain. This decrease can result from various causes or triggers, most of which are not serious. However, because near-syncope can sometimes be a sign of something serious, a medical evaluation is required. The specific cause is often not determined. HOME CARE INSTRUCTIONS  Monitor your condition for any changes. The following actions may help to alleviate any discomfort you are experiencing:  Have someone stay with you until you feel stable.  Lie down right away and prop your feet up if you start feeling like you might faint. Breathe deeply and steadily. Wait until all the symptoms have passed. Most of these episodes last only a few minutes. You may feel tired for several hours.   Drink enough fluids to keep your urine clear or pale yellow.   If you are taking blood pressure or heart medicine, get up slowly when seated or lying down. Take several minutes to sit and then stand. This can reduce dizziness.  Follow up with your health care provider as directed. SEEK IMMEDIATE MEDICAL CARE IF:   You have a severe headache.   You have unusual pain in the chest, abdomen, or back.   You are bleeding from the mouth or rectum, or you have black or tarry stool.   You have an irregular or very fast heartbeat.   You have repeated fainting or have seizure-like jerking during an episode.   You faint when sitting or lying down.   You have confusion.   You have difficulty walking.   You have severe weakness.   You have vision problems.  MAKE SURE YOU:   Understand these instructions.  Will  watch your condition.  Will get help right away if you are not doing well or get worse. Document Released: 09/06/2005 Document Revised: 09/11/2013 Document Reviewed: 02/09/2013 ExitCare Patient Information 2015 [employer]. This information is not intended to replace advice given to you by your health care provider. Make sure you discuss any questions you have with your health care provider.  

## 2015-05-22 NOTE — ED Notes (Signed)
Patient transported to CT 

## 2015-05-22 NOTE — ED Notes (Signed)
Pt ambulatoryto triage.  Pt had near syncope episode today.  Pt reports similar episode in august.  No chest pain or sob.  No n/v/d.  Pt has swelling to left side of face. Pt taking prednisone and levaquin for sinus infection.

## 2015-05-22 NOTE — ED Provider Notes (Addendum)
[facility] Emergency Department Provider Note     Time seen: ----------------------------------------- 4:09 PM on 05/22/2015 -----------------------------------------    I have reviewed the triage vital signs and the nursing notes.   HISTORY  Chief Complaint Near Syncope    HPI Anne Rush is a 54 y.o. female who presents ER for near syncopal event today. Patient reports similar episode during the past month. She denies any fevers, chills, chest pain, shortness of breath. She also denies nausea vomiting or diarrhea. Recently has been taking Levaquin and prednisone for suspected sinusitis. She had some swelling to left side of her face.   Past Medical History  Diagnosis Date  . Pollen allergies   . Interstitial lung disease   . Anxiety   . Hyperlipidemia   . Hypertension   . Squamous cell carcinoma 2014    skin cancer  . Subclinical hyperthyroidism 06/17/2014    Patient Active Problem List   Diagnosis Date Noted  . Subclinical hyperthyroidism 06/17/2014  . Obesity (BMI 30-39.9) 04/28/2014  . INTERSTITIAL LUNG DISEASE 08/27/2010  . LOW BACK PAIN, ACUTE 08/12/2009  . Former tobacco use 08/28/2008  . Hyperlipidemia LDL goal <100 06/21/2007  . ANXIETY 06/05/2007  . Essential hypertension 06/05/2007  . ALLERGIC RHINITIS 06/05/2007    Past Surgical History  Procedure Laterality Date  . Cosmetic surgery    . Tubal ligation      Allergies Codeine; Oxycodone-acetaminophen; Paroxetine; and Sulfonamide derivatives  Social History Social History  Substance Use Topics  . Smoking status: Former Games developer  . Smokeless tobacco: Never Used  . Alcohol Use: No    Review of Systems Constitutional: Negative for fever. Eyes: Negative for visual changes. ENT: Positive for facial swelling Cardiovascular: Negative for chest pain. Respiratory: Negative for shortness of breath. Gastrointestinal: Negative for abdominal pain, vomiting and  diarrhea. Genitourinary: Negative for dysuria. Musculoskeletal: Negative for back pain. Skin: Negative for rash. Neurological: Negative for headaches, focal weakness or numbness.  10-point ROS otherwise negative.  ____________________________________________   PHYSICAL EXAM:  VITAL SIGNS: ED Triage Vitals  Enc Vitals Group     BP 05/22/15 1536 191/112 mmHg     Pulse Rate 05/22/15 1536 132     Resp 05/22/15 1536 20     Temp 05/22/15 1536 98 F (36.7 C)     Temp Source 05/22/15 1536 Oral     SpO2 05/22/15 1536 99 %     Weight 05/22/15 1536 185 lb (83.915 kg)     Height 05/22/15 1536  (1.6 m)     Head Cir --      Peak Flow --      Pain Score 05/22/15 1538 4     Pain Loc --      Pain Edu? --      Excl. in GC? --     Constitutional: Alert and oriented. Anxious, mild distress Eyes: Conjunctivae are normal. PERRL. Normal extraocular movements. ENT   Head: Normocephalic and atraumatic.   Nose: No congestion/rhinnorhea.   Mouth/Throat: Mucous membranes are moist.   Neck: No stridor. Cardiovascular: Rapid rate, regular rhythm. Normal and symmetric distal pulses are present in all extremities. No murmurs, rubs, or gallops. Respiratory: Normal respiratory effort without tachypnea nor retractions. Breath sounds are clear and equal bilaterally. No wheezes/rales/rhonchi. Gastrointestinal: Soft and nontender. No distention. No abdominal bruits.  Musculoskeletal: Nontender with normal range of motion in all extremities. No joint effusions.  No lower extremity tenderness nor edema. Neurologic:  Normal speech and language. No  gross focal neurologic deficits are appreciated. Speech is normal. No gait instability. Skin:  Skin is warm, dry and intact. No rash noted. Psychiatric: Mood and affect are normal. Speech and behavior are normal. Patient exhibits appropriate insight and judgment. ____________________________________________  EKG: Interpreted by me. Sinus  tachycardia with a rate of 127 bpm, LVH with re-pole, normal PR interval, normal QS with, normal axis. EKG is unchanged from 2013 ____________________________________________  ED COURSE:  Pertinent labs & imaging results that were available during my care of the patient were reviewed by me and considered in my medical decision making (see chart for details). Patient will be basic labs, recently had a negative workup with lab work. ____________________________________________    Vickie Epley (pertinent positives/negatives)  Labs Reviewed  BASIC METABOLIC PANEL - Abnormal; Notable for the following:    Chloride 100 (*)    Glucose, Bld 145 (*)    All other components within normal limits  CBC - Abnormal; Notable for the following:    WBC 14.0 (*)    All other components within normal limits  TROPONIN I   CT head is unremarkable, clear sinuses ____________________________________________  FINAL ASSESSMENT AND PLAN  Near-syncope  Plan: Patient with labs as dictated above. Patient was very well, I think a lot of her symptoms are likely anxiety related. She'll be given Valium to take as needed, heart rate jumps from around 100-120 while I am in the room and subsequently returns to normal. I do not find any cause for her symptoms at this time. She stable for outpatient follow-up with her doctor.   Emily Filbert, MD   Emily Filbert, MD 05/22/15 1724  Emily Filbert, MD 05/22/15 1726

## 2015-05-28 ENCOUNTER — Telehealth: Payer: Self-pay | Admitting: Family Medicine

## 2015-05-28 ENCOUNTER — Ambulatory Visit (INDEPENDENT_AMBULATORY_CARE_PROVIDER_SITE_OTHER): Payer: BLUE CROSS/BLUE SHIELD | Admitting: Family Medicine

## 2015-05-28 ENCOUNTER — Encounter: Payer: Self-pay | Admitting: Family Medicine

## 2015-05-28 ENCOUNTER — Ambulatory Visit: Payer: Self-pay | Admitting: Family Medicine

## 2015-05-28 VITALS — BP 164/100 | HR 96 | Temp 97.7°F | Ht 63.0 in | Wt 186.8 lb

## 2015-05-28 DIAGNOSIS — R51 Headache: Secondary | ICD-10-CM | POA: Diagnosis not present

## 2015-05-28 DIAGNOSIS — I10 Essential (primary) hypertension: Secondary | ICD-10-CM

## 2015-05-28 DIAGNOSIS — H539 Unspecified visual disturbance: Secondary | ICD-10-CM

## 2015-05-28 DIAGNOSIS — R200 Anesthesia of skin: Secondary | ICD-10-CM

## 2015-05-28 DIAGNOSIS — R208 Other disturbances of skin sensation: Secondary | ICD-10-CM | POA: Diagnosis not present

## 2015-05-28 DIAGNOSIS — R519 Headache, unspecified: Secondary | ICD-10-CM

## 2015-05-28 NOTE — Telephone Encounter (Signed)
Pt called stating she was to give [place] Dr. Merleen Nicely phone number [phone] for Dr. Patsy Lager.  The best number to reach pt at is [phone].

## 2015-05-28 NOTE — Patient Instructions (Addendum)
Please get a blood pressure cuff from the pharmacy.  Check every other day when relaxed  Goal 130/80  REFERRALS TO SPECIALISTS, SPECIAL TESTS (MRI, CT, ULTRASOUNDS)  [facility] or [facility]DA will help you. ASK CHECK-IN FOR HELP.  Imaging / Special Testing referrals sometimes can be done same day if EMERGENCY, but others can take 2 or 3 days to get an appointment. Starting in 2015, many of the new Medicare plans and Obamacare plans take much longer.   Specialist appointment times vary a great deal, based on their schedule / openings. -- Some specialists have very long wait times. (Example. Dermatology. Multiple months  for non-cancer)

## 2015-05-28 NOTE — Progress Notes (Signed)
 Dr. Spencer T. Copland, MD, CAQ Sports Medicine Primary [facility] and Sports Medicine [address] [place], [address] Phone: [phone] Fax: [phone]  05/28/2015  Patient: Anne Rush, MRN: [id number], DOB: 02/17/1961, 54 y.o.  Primary Physician:  Spencer Copland, MD  Chief Complaint: Follow-up  Subjective:   Anne Rush is a 54 y.o. very pleasant female patient who presents with the following:  ER f/u 05/22/2015.   Went to the ER, panicking, thought "I am going to black out." At that time BP approx 195/110  BP Readings from Last 3 Encounters:  05/28/15 164/100  05/22/15 138/86  04/28/15 146/92    Tuesday, felt like she was going to pass out.  Called triage.  Wed. Felt ok, Thursday, then felt faint and head was pounding inside.  Went home early from work.   Every day has been a different day for her.  Some days feels ok, others not.   Can feel her heart pounding. Not racing.  Feel like cannot take a deep breath to relax.   Left eye - "something is happening." Has been seeing eye doctor.   Felt numbness in the left face.   The patient has been having a number of symptoms ongoing for greater than a month including ongoing eye symptoms. She's been treated with steroids and she also has been having some headaches and facial pain. She also saw Dr. McQueen her gave her Levaquin and oral steroids for presumptive sinusitis.   Request to call Dr. Bell to talk about the patient.   Past Medical History, Surgical History, Social History, Family History, Problem List, Medications, and Allergies have been reviewed and updated if relevant.  Patient Active Problem List   Diagnosis Date Noted  . Subclinical hyperthyroidism 06/17/2014  . Obesity (BMI 30-39.9) 04/28/2014  . INTERSTITIAL LUNG DISEASE 08/27/2010  . LOW BACK PAIN, ACUTE 08/12/2009  . Former tobacco use 08/28/2008  . Hyperlipidemia LDL goal <100 06/21/2007  . ANXIETY 06/05/2007  . Essential hypertension  06/05/2007  . ALLERGIC RHINITIS 06/05/2007    Past Medical History  Diagnosis Date  . Pollen allergies   . Interstitial lung disease   . Anxiety   . Hyperlipidemia   . Hypertension   . Squamous cell carcinoma 2014    skin cancer  . Subclinical hyperthyroidism 06/17/2014    Past Surgical History  Procedure Laterality Date  . Cosmetic surgery    . Tubal ligation      Social History   Social History  . Marital Status: Married    Spouse Name: N/A  . Number of Children: 1  . Years of Education: N/A   Occupational History  . house wife    Social History Main Topics  . Smoking status: Former Smoker  . Smokeless tobacco: Never Used  . Alcohol Use: No  . Drug Use: No  . Sexual Activity: Not on file   Other Topics Concern  . Not on file   Social History Narrative   Regular exercise-no    Family History  Problem Relation Age of Onset  . Anxiety disorder Mother   . Hypertension Mother   . Cancer Maternal Aunt     breast  . Heart failure Maternal Grandfather     Allergies  Allergen Reactions  . Codeine     REACTION: hives, itching  . Oxycodone-Acetaminophen     REACTION: unspecified  . Paroxetine   . Sulfonamide Derivatives     REACTION: unspecified    Medication list   reviewed and updated in full in [facility].  GEN: No acute illnesses, no fevers, chills. GI: No n/v/d, eating normally Pulm: No SOB Interactive and getting along well at home.  Otherwise, ROS is as per the HPI.  Objective:   BP 164/100 mmHg  Pulse 96  Temp(Src) 97.7 F (36.5 C) (Oral)  Ht 5' 3" (1.6 m)  Wt 186 lb 12 oz (84.709 kg)  BMI 33.09 kg/m2   GEN: WDWN, NAD, Non-toxic, A & O x 3 HEENT: Atraumatic, Normocephalic. Neck supple. No masses, No LAD. Ears and Nose: No external deformity. CV: RRR, No M/G/R. No JVD. No thrill. No extra heart sounds. PULM: CTA B, no wheezes, crackles, rhonchi. No retractions. No resp. distress. No accessory muscle use. ABD: S, NT, ND, +BS.  No rebound tenderness. No HSM.  EXTR: No c/c/e  Neuro: CN 2-12 grossly intact. PERRLA. EOMI. Sensation intact throughout. Str 5/5 all extremities. DTR 2+. No clonus. A and o x 4. Romberg neg. Finger nose neg. Heel -shin neg.   PSYCH: Normally interactive. Conversant. Not depressed or anxious appearing.  Calm demeanor.     Laboratory and Imaging Data:  Ct Head Wo Contrast  05/22/2015   CLINICAL DATA:  Near syncope. Similar episode last month. Anterior headaches.  EXAM: CT HEAD WITHOUT CONTRAST  TECHNIQUE: Contiguous axial images were obtained from the base of the skull through the vertex without intravenous contrast.  COMPARISON:  None.  FINDINGS: No evidence for acute infarction, hemorrhage, mass lesion, hydrocephalus, or extra-axial fluid. No atrophy or white matter disease. Intact calvarium. No acute sinus or mastoid disease.  IMPRESSION: Negative exam.   Electronically Signed   By: Staci Righter M.D.   On: 05/22/2015 17:13     Recent Results (from the past 2160 hour(s))  Basic metabolic panel     Status: None   Collection Time: 05/12/15  9:01 AM  Result Value Ref Range   Sodium 139 135 - 145 mEq/L   Potassium 4.1 3.5 - 5.1 mEq/L   Chloride 102 96 - 112 mEq/L   CO2 28 19 - 32 mEq/L   Glucose, Bld 88 70 - 99 mg/dL   BUN 11 6 - 23 mg/dL   Creatinine, Ser 0.62 0.40 - 1.20 mg/dL   Calcium 9.7 8.4 - 10.5 mg/dL   GFR 107.26 >60.00 mL/min  CBC with Differential/Platelet     Status: Abnormal   Collection Time: 05/12/15  9:01 AM  Result Value Ref Range   WBC 5.7 4.0 - 10.5 K/uL   RBC 4.84 3.87 - 5.11 Mil/uL   Hemoglobin 15.1 (H) 12.0 - 15.0 g/dL   HCT 43.5 36.0 - 46.0 %   MCV 89.9 78.0 - 100.0 fl   MCHC 34.8 30.0 - 36.0 g/dL   RDW 12.7 11.5 - 15.5 %   Platelets 228.0 150.0 - 400.0 K/uL   Neutrophils Relative % 49.0 43.0 - 77.0 %   Lymphocytes Relative 43.0 12.0 - 46.0 %   Monocytes Relative 6.1 3.0 - 12.0 %   Eosinophils Relative 1.5 0.0 - 5.0 %   Basophils Relative 0.4 0.0 - 3.0 %     Neutro Abs 2.8 1.4 - 7.7 K/uL   Lymphs Abs 2.4 0.7 - 4.0 K/uL   Monocytes Absolute 0.3 0.1 - 1.0 K/uL   Eosinophils Absolute 0.1 0.0 - 0.7 K/uL   Basophils Absolute 0.0 0.0 - 0.1 K/uL  Hepatic function panel     Status: None   Collection Time: 05/12/15  9:01 AM  Result Value Ref Range   Total Bilirubin 0.6 0.2 - 1.2 mg/dL   Bilirubin, Direct 0.1 0.0 - 0.3 mg/dL   Alkaline Phosphatase 46 39 - 117 U/L   AST 17 0 - 37 U/L   ALT 17 0 - 35 U/L   Total Protein 7.5 6.0 - 8.3 g/dL   Albumin 4.8 3.5 - 5.2 g/dL  Lipid panel     Status: Abnormal   Collection Time: 05/12/15  9:01 AM  Result Value Ref Range   Cholesterol 237 (H) 0 - 200 mg/dL    Comment: ATP III Classification       Desirable:  < 200 mg/dL               Borderline High:  200 - 239 mg/dL          High:  > = 240 mg/dL   Triglycerides 195.0 (H) 0.0 - 149.0 mg/dL    Comment: Normal:  <150 mg/dLBorderline High:  150 - 199 mg/dL   HDL 49.60 >39.00 mg/dL   VLDL 39.0 0.0 - 40.0 mg/dL   LDL Cholesterol 149 (H) 0 - 99 mg/dL   Total CHOL/HDL Ratio 5     Comment:                Men          Women1/2 Average Risk     3.4          3.3Average Risk          5.0          4.42X Average Risk          9.6          7.13X Average Risk          15.0          11.0                       NonHDL 187.71     Comment: NOTE:  Non-HDL goal should be 30 mg/dL higher than patient's LDL goal (i.e. LDL goal of < 70 mg/dL, would have non-HDL goal of < 100 mg/dL)  TSH     Status: None   Collection Time: 05/12/15  9:01 AM  Result Value Ref Range   TSH 1.56 0.35 - 4.50 uIU/mL  High sensitivity CRP     Status: None   Collection Time: 05/12/15  9:01 AM  Result Value Ref Range   CRP, High Sensitivity 0.730 0.000 - 5.000 mg/L    Comment: Note:  An elevated hs-CRP (>5 mg/L) should be repeated after 2 weeks to rule out recent infection or trauma.  Sedimentation rate     Status: None   Collection Time: 05/12/15  9:01 AM  Result Value Ref Range   Sed Rate 10 0 - 22  mm/hr  Uric acid     Status: None   Collection Time: 05/12/15  9:01 AM  Result Value Ref Range   Uric Acid, Serum 6.2 2.4 - 7.0 mg/dL  Cyclic citrul peptide antibody, IgG     Status: None   Collection Time: 05/12/15  9:18 AM  Result Value Ref Range   Cyclic Citrullin Peptide Ab <2.0 0.0 - 5.0 U/mL    Comment:                              Interpretive Table                         Low Positive:  5.1 - 14.9 U/mL                       High Positive:  >= 15.0 U/mL   In addition to the CCP (APCA) result, and clinical symptoms including joint involvement, the 2010 ACR Classification Criteria for scoring/diagnosing Rheumatoid Arthritis include the results of the following tests: RF (23890), CRP (23860), and ESR (15010). www.rheumatology.org/practice/clinical/classification/ra/ra_2010.asp   Rheumatoid factor     Status: None   Collection Time: 05/12/15  9:18 AM  Result Value Ref Range   Rhuematoid fact SerPl-aCnc <10 <=14 IU/mL    Comment:                            Interpretive Table                     Low Positive: 15 - 41 IU/mL                     High Positive:  >= 42 IU/mL    In addition to the RF result, and clinical symptoms including joint  involvement, the 2010 ACR Classification Criteria for  scoring/diagnosing Rheumatoid Arthritis include the results of the  following tests:  CRP (23860), ESR (15010), and CCP (APCA) (82255).  www.rheumatology.org/practice/clinical/classification/ra/ra_2010.asp   ANA     Status: None   Collection Time: 05/12/15  9:18 AM  Result Value Ref Range   Anit Nuclear Antibody(ANA) NEG NEGATIVE  Tissue transglutaminase, IgG     Status: None   Collection Time: 05/12/15  9:18 AM  Result Value Ref Range   Tissue Transglut Ab 1 <6 U/mL    Comment: Value Interpretation:   <6:   Antibody Not Detected  >=6:   Antibody Detected   Anti-DNA antibody, double-stranded     Status: None   Collection Time: 05/12/15  9:18 AM  Result Value Ref Range   ds DNA  Ab 1 IU/mL    Comment:                                 IU/mL       Interpretation                               < or = 4    Negative                               5-9         Indeterminate                               > or = 10   Positive     Anti-Smith antibody     Status: None   Collection Time: 05/12/15  9:33 AM  Result Value Ref Range   ENA SM Ab Ser-aCnc <1.0 NEG <1.0 NEG AI  Basic metabolic panel     Status: Abnormal   Collection Time: 05/22/15  3:42 PM  Result Value Ref Range   Sodium 139 135 - 145 mmol/L   Potassium 3.8 3.5 - 5.1 mmol/L   Chloride 100 (L) 101 - 111 mmol/L   CO2 27 22 - 32 mmol/L     Glucose, Bld 145 (H) 65 - 99 mg/dL   BUN 17 6 - 20 mg/dL   Creatinine, Ser 0.75 0.44 - 1.00 mg/dL   Calcium 10.1 8.9 - 10.3 mg/dL   GFR calc non Af Amer >60 >60 mL/min   GFR calc Af Amer >60 >60 mL/min    Comment: (NOTE) The eGFR has been calculated using the CKD EPI equation. This calculation has not been validated in all clinical situations. eGFR's persistently <60 mL/min signify possible Chronic Kidney Disease.    Anion gap 12 5 - 15  CBC     Status: Abnormal   Collection Time: 05/22/15  3:42 PM  Result Value Ref Range   WBC 14.0 (H) 3.6 - 11.0 K/uL   RBC 5.07 3.80 - 5.20 MIL/uL   Hemoglobin 15.4 12.0 - 16.0 g/dL   HCT 45.0 35.0 - 47.0 %   MCV 88.8 80.0 - 100.0 fL   MCH 30.4 26.0 - 34.0 pg   MCHC 34.2 32.0 - 36.0 g/dL   RDW 12.5 11.5 - 14.5 %   Platelets 257 150 - 440 K/uL  Troponin I     Status: None   Collection Time: 05/22/15  3:42 PM  Result Value Ref Range   Troponin I <0.03 <0.031 ng/mL    Comment:        NO INDICATION OF MYOCARDIAL INJURY.      Assessment and Plan:   Left facial numbness - Plan: MR Brain W Wo Contrast  Visual changes - Plan: MR Brain W Wo Contrast  Headache in front of head - Plan: MR Brain W Wo Contrast  Essential hypertension  With a history of waxing and waning ocular symptoms, visual changes, headache, left sided facial  numbness, obtain an MRI of the brain with and without contrast to evaluate for demyelinating disease, neoplasm, CVA, or other occult intracranial derangement.  New Prescriptions   No medications on file   Orders Placed This Encounter  Procedures  . MR Brain W Wo Contrast    Signed,  Spencer T. Copland, MD   Patient's Medications  New Prescriptions   No medications on file  Previous Medications   DIAZEPAM (VALIUM) 5 MG TABLET    Take 1 tablet (5 mg total) by mouth every 8 (eight) hours as needed for anxiety.   HYDROCHLOROTHIAZIDE (HYDRODIURIL) 25 MG TABLET    TAKE 1 TABLET (25 MG TOTAL) BY MOUTH DAILY.   PREDNISOLONE ACETATE (PRED FORTE) 1 % OPHTHALMIC SUSPENSION      Modified Medications   No medications on file  Discontinued Medications   No medications on file      

## 2015-05-28 NOTE — Progress Notes (Signed)
Pre visit review using our clinic review tool, if applicable. No additional management support is needed unless otherwise documented below in the visit note. 

## 2015-05-30 NOTE — Telephone Encounter (Signed)
Attempted call, no answer 

## 2015-06-02 NOTE — Telephone Encounter (Signed)
Spoke with Dr. Alvester Morin - he had wanted me to get brain MRI, which we have already done

## 2015-06-03 ENCOUNTER — Ambulatory Visit
Admission: RE | Admit: 2015-06-03 | Discharge: 2015-06-03 | Disposition: A | Discharge status: Home / Self Care | Payer: BLUE CROSS/BLUE SHIELD | Source: Ambulatory Visit | Attending: Family Medicine | Admitting: Family Medicine

## 2015-06-03 DIAGNOSIS — R208 Other disturbances of skin sensation: Secondary | ICD-10-CM | POA: Diagnosis not present

## 2015-06-03 DIAGNOSIS — H539 Unspecified visual disturbance: Secondary | ICD-10-CM

## 2015-06-03 DIAGNOSIS — R51 Headache: Secondary | ICD-10-CM

## 2015-06-03 DIAGNOSIS — R519 Headache, unspecified: Secondary | ICD-10-CM

## 2015-06-03 DIAGNOSIS — R200 Anesthesia of skin: Secondary | ICD-10-CM

## 2015-06-03 MED ORDER — GADOBENATE DIMEGLUMINE 529 MG/ML IV SOLN
20.0000 mL | Freq: Once | INTRAVENOUS | Status: AC | PRN
  Administered 2015-06-03: 17 mL via INTRAVENOUS

## 2015-06-04 ENCOUNTER — Encounter: Payer: Self-pay | Admitting: Family Medicine

## 2015-06-04 ENCOUNTER — Telehealth: Payer: Self-pay | Admitting: *Deleted

## 2015-06-04 MED ORDER — DIAZEPAM 5 MG PO TABS: 2.5000 mg | ORAL_TABLET | Freq: Two times a day (BID) | ORAL | Status: DC | PRN

## 2015-06-04 NOTE — Telephone Encounter (Signed)
-----   Message from [name], MD sent at 06/04/2015  1:48 PM EDT ----- Please call in for the patient  Valium 5 mg, 1/2 - 1 tab po bid prn anxiety, #60, 1 refill

## 2015-06-04 NOTE — Telephone Encounter (Signed)
Valium called into [facility] [address], [place].

## 2015-06-05 ENCOUNTER — Other Ambulatory Visit: Payer: Self-pay | Admitting: Family Medicine

## 2015-06-05 ENCOUNTER — Encounter: Payer: Self-pay | Admitting: Family Medicine

## 2015-06-05 DIAGNOSIS — R519 Headache, unspecified: Secondary | ICD-10-CM

## 2015-06-05 DIAGNOSIS — H539 Unspecified visual disturbance: Secondary | ICD-10-CM

## 2015-06-05 DIAGNOSIS — R51 Headache: Secondary | ICD-10-CM

## 2015-06-05 DIAGNOSIS — R200 Anesthesia of skin: Secondary | ICD-10-CM

## 2015-06-30 ENCOUNTER — Encounter: Payer: Self-pay | Admitting: Family Medicine

## 2015-08-04 ENCOUNTER — Ambulatory Visit (INDEPENDENT_AMBULATORY_CARE_PROVIDER_SITE_OTHER): Payer: BLUE CROSS/BLUE SHIELD | Admitting: Family Medicine

## 2015-08-04 ENCOUNTER — Other Ambulatory Visit: Payer: Self-pay | Admitting: Family Medicine

## 2015-08-04 ENCOUNTER — Encounter: Payer: Self-pay | Admitting: Family Medicine

## 2015-08-04 VITALS — BP 143/72 | HR 59 | Temp 98.3°F | Ht 63.0 in | Wt 191.0 lb

## 2015-08-04 DIAGNOSIS — I10 Essential (primary) hypertension: Secondary | ICD-10-CM

## 2015-08-04 NOTE — Progress Notes (Signed)
Dr. Karleen Hampshire T. Shean Gerding, MD, CAQ Sports Medicine Primary Care and Sports Medicine [address] [place], [address] Phone: [phone] Fax: [phone]  08/04/2015  Patient: ABISAI COBLE, MRN: [id number], DOB: 1961/02/23, 54 y.o.  Primary Physician:  [name], MD   Chief Complaint  Patient presents with  . Follow-up    Blood pressure   Subjective:   Sarinity Dicicco Novella is a 54 y.o. very pleasant female patient who presents with the following:  Started walking Eating better  HCTZ 25 mg 150/90 on my read 148/88 check again  Wt Readings from Last 3 Encounters:  08/04/15 191 lb (86.637 kg)  05/28/15 186 lb 12 oz (84.709 kg)  05/22/15 185 lb (83.915 kg)    No caffeine coffee.   Burning sensations in her - seeing Dr. Doylene Bode - taking 20 mg.  Was getting some puffiness     Past Medical History, Surgical History, Social History, Family History, Problem List, Medications, and Allergies have been reviewed and updated if relevant.  Patient Active Problem List   Diagnosis Date Noted  . Subclinical hyperthyroidism 06/17/2014  . Obesity (BMI 30-39.9) 04/28/2014  . INTERSTITIAL LUNG DISEASE 08/27/2010  . LOW BACK PAIN, ACUTE 08/12/2009  . Former tobacco use 08/28/2008  . Hyperlipidemia LDL goal <100 06/21/2007  . ANXIETY 06/05/2007  . Essential hypertension 06/05/2007  . ALLERGIC RHINITIS 06/05/2007    Past Medical History  Diagnosis Date  . Pollen allergies   . Interstitial lung disease (HCC)   . Anxiety   . Hyperlipidemia   . Hypertension   . Squamous cell carcinoma (HCC) 2014    skin cancer  . Subclinical hyperthyroidism 06/17/2014    Past Surgical History  Procedure Laterality Date  . Cosmetic surgery    . Tubal ligation      Social History   Social History  . Marital Status: Married    Spouse Name: N/A  . Number of Children: 1  . Years of Education: N/A   Occupational History  . house wife    Social History Main Topics  .  Smoking status: Former Games developer  . Smokeless tobacco: Never Used  . Alcohol Use: No  . Drug Use: No  . Sexual Activity: Not on file   Other Topics Concern  . Not on file   Social History Narrative   Regular exercise-no    Family History  Problem Relation Age of Onset  . Anxiety disorder Mother   . Hypertension Mother   . Cancer Maternal Aunt     breast  . Heart failure Maternal Grandfather     Allergies  Allergen Reactions  . Codeine     REACTION: hives, itching  . Oxycodone-Acetaminophen     REACTION: unspecified  . Paroxetine   . Sulfonamide Derivatives     REACTION: unspecified    Medication list reviewed and updated in full in [facility].   GEN: No acute illnesses, no fevers, chills. GI: No n/v/d, eating normally Pulm: No SOB Interactive and getting along well at home.  Otherwise, ROS is as per the HPI.  Objective:   BP 143/72 mmHg  Pulse 59  Temp(Src) 98.3 F (36.8 C) (Oral)  Ht  (1.6 m)  Wt 191 lb (86.637 kg)  BMI 33.84 kg/m2  GEN: WDWN, NAD, Non-toxic, A & O x 3 HEENT: Atraumatic, Normocephalic. Neck supple. No masses, No LAD. Ears and Nose: No external deformity. CV: RRR, No M/G/R. No JVD. No thrill. No extra heart  sounds. PULM: CTA B, no wheezes, crackles, rhonchi. No retractions. No resp. distress. No accessory muscle use. EXTR: No c/c/e NEURO Normal gait.  PSYCH: Normally interactive. Conversant. Not depressed or anxious appearing.  Calm demeanor.   Laboratory and Imaging Data:  Assessment and Plan:   Essential hypertension  Stay on HCTZ and keep up lifestyle changes  Follow-up: Return in about 6 months (around 02/01/2016).  Signed,  Elpidio Galea. Timo Hartwig, MD   Patient's Medications  New Prescriptions   No medications on file  Previous Medications   DIAZEPAM (VALIUM) 5 MG TABLET    Take 0.5-1 tablets (2.5-5 mg total) by mouth 2 (two) times daily as needed for anxiety.   HYDROCHLOROTHIAZIDE (HYDRODIURIL) 25 MG TABLET     TAKE 1 TABLET (25 MG TOTAL) BY MOUTH DAILY.   NORTRIPTYLINE (PAMELOR) 10 MG CAPSULE    Take 20 mg by mouth at bedtime.    PREDNISOLONE ACETATE (PRED FORTE) 1 % OPHTHALMIC SUSPENSION      Modified Medications   No medications on file  Discontinued Medications   No medications on file

## 2015-08-04 NOTE — Progress Notes (Signed)
Pre visit review using our clinic review tool, if applicable. No additional management support is needed unless otherwise documented below in the visit note. 

## 2015-12-16 ENCOUNTER — Encounter: Payer: Self-pay | Admitting: Family Medicine

## 2015-12-16 NOTE — Telephone Encounter (Signed)
I am sending to Lupita Leash and Almira Coaster to ensure handled ASAP.   Luisa's mother is not my patient - I am not sure of her name, so we will have to ask her. (ask Alyss - her father's name is Windy Fast)  It sounds like her own doctor or one of our doctor's needs to see her today. (ideally her own.) Please explain that I am not in the office today, and I have some personal issues that may tie me up for some time.  Please help.  Cc: Lupita Leash and Almira Coaster

## 2016-01-21 DIAGNOSIS — L570 Actinic keratosis: Secondary | ICD-10-CM | POA: Diagnosis not present

## 2016-02-12 ENCOUNTER — Encounter: Payer: Self-pay | Admitting: Family Medicine

## 2016-02-18 ENCOUNTER — Ambulatory Visit (INDEPENDENT_AMBULATORY_CARE_PROVIDER_SITE_OTHER): Payer: BLUE CROSS/BLUE SHIELD | Admitting: Family Medicine

## 2016-02-18 ENCOUNTER — Encounter: Payer: Self-pay | Admitting: Family Medicine

## 2016-02-18 VITALS — BP 177/86 | HR 46 | Temp 97.8°F | Ht 63.0 in | Wt 193.5 lb

## 2016-02-18 DIAGNOSIS — I10 Essential (primary) hypertension: Secondary | ICD-10-CM | POA: Diagnosis not present

## 2016-02-18 DIAGNOSIS — F411 Generalized anxiety disorder: Secondary | ICD-10-CM

## 2016-02-18 MED ORDER — METOPROLOL SUCCINATE ER 25 MG PO TB24: 25.0000 mg | ORAL_TABLET | Freq: Every day | ORAL | Status: DC

## 2016-02-18 NOTE — Progress Notes (Signed)
Pre visit review using our clinic review tool, if applicable. No additional management support is needed unless otherwise documented below in the visit note. 

## 2016-02-18 NOTE — Progress Notes (Signed)
Dr. Karleen Hampshire T. Ronetta Molla, MD, CAQ Sports Medicine Primary Care and Sports Medicine [address] [place], [address] Phone: [phone] Fax: [phone]  02/18/2016  Patient: Anne Rush, MRN: [id number], DOB: 1960/12/01, 55 y.o.  Primary Physician:  [name], MD   Chief Complaint  Patient presents with  . Follow-up    Blood Pressure   Subjective:   Tequisha Maahs Jakel is a 55 y.o. very pleasant female patient who presents with the following:  152/82 on my recheck, pulse 90 on my recheck.   Had some anxiety and a increased BP at home.  140's-160's/90's/110's  She has very significant history of anxiety and anxiety attacks.  I have been unable to get her to take baseline medication for an extended period of time.  She does take Valium intermittently.  She is currently on hydrochlorothiazide 25 mg daily.  She is compliant with taking this.  Past Medical History, Surgical History, Social History, Family History, Problem List, Medications, and Allergies have been reviewed and updated if relevant.  Patient Active Problem List   Diagnosis Date Noted  . Subclinical hyperthyroidism 06/17/2014  . Obesity (BMI 30-39.9) 04/28/2014  . INTERSTITIAL LUNG DISEASE 08/27/2010  . LOW BACK PAIN, ACUTE 08/12/2009  . Former tobacco use 08/28/2008  . Hyperlipidemia LDL goal <100 06/21/2007  . ANXIETY 06/05/2007  . Essential hypertension 06/05/2007  . ALLERGIC RHINITIS 06/05/2007    Past Medical History  Diagnosis Date  . Pollen allergies   . Interstitial lung disease (HCC)   . Anxiety   . Hyperlipidemia   . Hypertension   . Squamous cell carcinoma (HCC) 2014    skin cancer  . Subclinical hyperthyroidism 06/17/2014    Past Surgical History  Procedure Laterality Date  . Cosmetic surgery    . Tubal ligation      Social History   Social History  . Marital Status: Married    Spouse Name: N/A  . Number of Children: 1  . Years of Education: N/A   Occupational History    . house wife    Social History Main Topics  . Smoking status: Former Games developer  . Smokeless tobacco: Never Used  . Alcohol Use: No  . Drug Use: No  . Sexual Activity: Not on file   Other Topics Concern  . Not on file   Social History Narrative   Regular exercise-no    Family History  Problem Relation Age of Onset  . Anxiety disorder Mother   . Hypertension Mother   . Cancer Maternal Aunt     breast  . Heart failure Maternal Grandfather     Allergies  Allergen Reactions  . Codeine     REACTION: hives, itching  . Oxycodone-Acetaminophen     REACTION: unspecified  . Paroxetine   . Sulfonamide Derivatives     REACTION: unspecified    Medication list reviewed and updated in full in [facility].   GEN: No acute illnesses, no fevers, chills. GI: No n/v/d, eating normally Pulm: No SOB Interactive and getting along well at home.  Otherwise, ROS is as per the HPI.  Objective:   BP 177/86 mmHg  Pulse 46  Temp(Src) 97.8 F (36.6 C) (Oral)  Ht  (1.6 m)  Wt 193 lb 8 oz (87.771 kg)  BMI 34.29 kg/m2  GEN: WDWN, NAD, Non-toxic, A & O x 3 HEENT: Atraumatic, Normocephalic. Neck supple. No masses, No LAD. Ears and Nose: No external deformity. CV: RRR, No M/G/R. No JVD.  No thrill. No extra heart sounds. PULM: CTA B, no wheezes, crackles, rhonchi. No retractions. No resp. distress. No accessory muscle use. EXTR: No c/c/e NEURO Normal gait.  PSYCH: Normally interactive. Conversant. Not depressed or anxious appearing.  Calm demeanor.   Laboratory and Imaging Data:  Assessment and Plan:   Essential hypertension  GAD (generalized anxiety disorder)  Uncontrolled hypertension in the setting of significant anxiety but is not fully controlled.  Add beta blocker.  Hopefully this will help with some of her intermittent symptoms from her anxiety and anxiety attacks and additionally will help control her blood pressure.  Her pulse was in the 90s today on my exam, and  she has typically been elevated somewhat while in the office.  Follow-up: Return in about 1 month (around 03/19/2016).  New Prescriptions   METOPROLOL SUCCINATE (TOPROL-XL) 25 MG 24 HR TABLET    Take 1 tablet (25 mg total) by mouth daily.   Signed,  Elpidio Galea. Haim Hansson, MD   Patient's Medications  New Prescriptions   METOPROLOL SUCCINATE (TOPROL-XL) 25 MG 24 HR TABLET    Take 1 tablet (25 mg total) by mouth daily.  Previous Medications   DIAZEPAM (VALIUM) 5 MG TABLET    Take 0.5-1 tablets (2.5-5 mg total) by mouth 2 (two) times daily as needed for anxiety.   HYDROCHLOROTHIAZIDE (HYDRODIURIL) 25 MG TABLET    TAKE 1 TABLET (25 MG TOTAL) BY MOUTH DAILY.   NORTRIPTYLINE (PAMELOR) 10 MG CAPSULE    Take 20 mg by mouth at bedtime.    PREDNISOLONE ACETATE (PRED FORTE) 1 % OPHTHALMIC SUSPENSION      Modified Medications   No medications on file  Discontinued Medications   No medications on file

## 2016-02-24 DIAGNOSIS — L239 Allergic contact dermatitis, unspecified cause: Secondary | ICD-10-CM | POA: Diagnosis not present

## 2016-02-25 ENCOUNTER — Encounter: Payer: Self-pay | Admitting: Family Medicine

## 2016-02-25 ENCOUNTER — Telehealth: Payer: Self-pay | Admitting: Internal Medicine

## 2016-02-25 ENCOUNTER — Telehealth: Payer: Self-pay | Admitting: Family Medicine

## 2016-02-25 NOTE — Telephone Encounter (Signed)
Pt husband called in about his wife needing to be seen sooner than her appt due to her not feeling well. I advised husband that I can check for the sooner appt that's avail and that was 06/27. He states he needs her to be seen sooner I advised I wish I could schedule her sooner but I don't have anything avail earlier. I did put pt on the wait list. Husband then requested to have Dr Darrick Huntsman call him. Thank you!   Call husband @ [phone]. Thank you!

## 2016-02-25 NOTE — Telephone Encounter (Signed)
See below my chart message ok to wait to 6/12   03/01/2016  9:00 AM 15 mins. [name], MD    [facility]      Patient Comments:   Office Visit   Pulse rate is running 46 - 56, feeling tired. Follow    up with Copland on last visit 5/31 as well.

## 2016-02-25 NOTE — Telephone Encounter (Signed)
Advised patient per MD to see present PCP until she can establish with new MD.

## 2016-02-25 NOTE — Telephone Encounter (Signed)
No, i will see her tomorrow. Please tell her that a pulse of 46-56 is typically ok, but i will recheck and adjust her BP medication tomorrow.

## 2016-02-25 NOTE — Telephone Encounter (Signed)
Pulse rate is 46 to 48  Blood pressure 177/86 with pulse rate of 46. Patient feels tired and weak patient cannot climb 16 steps with out sitting down. Becomes SOB with climbing steps. Patient is at work today schedule for Friday at 11.30, patient not having active symptoms at this not other than bradycardia of 48 beats per minute.   Advised patient if she has any chest pain or becomes faint or has any worsening symptoms to go to ER.

## 2016-02-25 NOTE — Telephone Encounter (Signed)
I have never seen this patient,  And Dr Patsy Lager started her on a beta blocker less than 7 days ago! Why am I involved? I will forward to Dr Patsy Lager.

## 2016-02-25 NOTE — Telephone Encounter (Signed)
Appointment scheduled tomorrow with Dr. Patsy Lager at 12:00 pm.  Anne Rush is aware of appointment.

## 2016-02-25 NOTE — Telephone Encounter (Signed)
i would rather see her tomorrow given her symptoms. High anxiety levels, but she did not start her new BP meds from last week.  Apologies to Dr. Darrick Huntsman, she has been my patient for many years.

## 2016-02-25 NOTE — Telephone Encounter (Signed)
Noted  

## 2016-02-26 ENCOUNTER — Emergency Department (HOSPITAL_COMMUNITY): Payer: BLUE CROSS/BLUE SHIELD

## 2016-02-26 ENCOUNTER — Encounter (HOSPITAL_COMMUNITY)
Admission: EM | Disposition: A | Discharge status: Home / Self Care | Payer: Self-pay | Source: Home / Self Care | Attending: Emergency Medicine

## 2016-02-26 ENCOUNTER — Encounter (HOSPITAL_COMMUNITY): Payer: Self-pay | Admitting: Family Medicine

## 2016-02-26 ENCOUNTER — Observation Stay (HOSPITAL_COMMUNITY)
Admission: EM | Admit: 2016-02-26 | Discharge: 2016-02-27 | Disposition: A | Discharge status: Home / Self Care | Payer: BLUE CROSS/BLUE SHIELD | Attending: Cardiology | Admitting: Cardiology

## 2016-02-26 ENCOUNTER — Ambulatory Visit (INDEPENDENT_AMBULATORY_CARE_PROVIDER_SITE_OTHER): Payer: BLUE CROSS/BLUE SHIELD | Admitting: Family Medicine

## 2016-02-26 ENCOUNTER — Encounter: Payer: Self-pay | Admitting: Family Medicine

## 2016-02-26 VITALS — BP 158/84 | HR 49 | Temp 97.5°F | Ht 63.0 in | Wt 192.0 lb

## 2016-02-26 DIAGNOSIS — R001 Bradycardia, unspecified: Secondary | ICD-10-CM | POA: Insufficient documentation

## 2016-02-26 DIAGNOSIS — Z87891 Personal history of nicotine dependence: Secondary | ICD-10-CM | POA: Insufficient documentation

## 2016-02-26 DIAGNOSIS — Z85828 Personal history of other malignant neoplasm of skin: Secondary | ICD-10-CM | POA: Insufficient documentation

## 2016-02-26 DIAGNOSIS — I442 Atrioventricular block, complete: Principal | ICD-10-CM | POA: Diagnosis present

## 2016-02-26 DIAGNOSIS — R0600 Dyspnea, unspecified: Secondary | ICD-10-CM | POA: Diagnosis not present

## 2016-02-26 DIAGNOSIS — I10 Essential (primary) hypertension: Secondary | ICD-10-CM | POA: Diagnosis not present

## 2016-02-26 DIAGNOSIS — E0590 Thyrotoxicosis, unspecified without thyrotoxic crisis or storm: Secondary | ICD-10-CM | POA: Diagnosis not present

## 2016-02-26 DIAGNOSIS — R0602 Shortness of breath: Secondary | ICD-10-CM | POA: Diagnosis not present

## 2016-02-26 DIAGNOSIS — F419 Anxiety disorder, unspecified: Secondary | ICD-10-CM | POA: Insufficient documentation

## 2016-02-26 DIAGNOSIS — R5383 Other fatigue: Secondary | ICD-10-CM

## 2016-02-26 DIAGNOSIS — E785 Hyperlipidemia, unspecified: Secondary | ICD-10-CM | POA: Insufficient documentation

## 2016-02-26 DIAGNOSIS — J984 Other disorders of lung: Secondary | ICD-10-CM | POA: Diagnosis not present

## 2016-02-26 DIAGNOSIS — Z95818 Presence of other cardiac implants and grafts: Secondary | ICD-10-CM

## 2016-02-26 DIAGNOSIS — I459 Conduction disorder, unspecified: Secondary | ICD-10-CM | POA: Diagnosis not present

## 2016-02-26 HISTORY — DX: Atrioventricular block, complete: I44.2

## 2016-02-26 HISTORY — PX: EP IMPLANTABLE DEVICE: SHX172B

## 2016-02-26 LAB — I-STAT TROPONIN, ED: Troponin i, poc: 0.04 ng/mL (ref 0.00–0.08)

## 2016-02-26 LAB — CBC
HCT: 45.3 % (ref 36.0–46.0)
Hemoglobin: 15.8 g/dL — ABNORMAL HIGH (ref 12.0–15.0)
MCH: 31.1 pg (ref 26.0–34.0)
MCHC: 34.9 g/dL (ref 30.0–36.0)
MCV: 89.2 fL (ref 78.0–100.0)
Platelets: 236 10*3/uL (ref 150–400)
RBC: 5.08 MIL/uL (ref 3.87–5.11)
RDW: 12.3 % (ref 11.5–15.5)
WBC: 7.9 10*3/uL (ref 4.0–10.5)

## 2016-02-26 LAB — BASIC METABOLIC PANEL
Anion gap: 10 (ref 5–15)
BUN: 7 mg/dL (ref 6–20)
CO2: 26 mmol/L (ref 22–32)
Calcium: 9.9 mg/dL (ref 8.9–10.3)
Chloride: 102 mmol/L (ref 101–111)
Creatinine, Ser: 0.68 mg/dL (ref 0.44–1.00)
GFR calc Af Amer: >60 mL/min (ref >60)
GFR calc non Af Amer: >60 mL/min (ref >60)
Glucose, Bld: 108 mg/dL — ABNORMAL HIGH (ref 65–99)
Potassium: 3.5 mmol/L (ref 3.5–5.1)
Sodium: 138 mmol/L (ref 135–145)

## 2016-02-26 SURGERY — PACEMAKER IMPLANT

## 2016-02-26 MED ORDER — ACETAMINOPHEN 325 MG PO TABS
325.0000 mg | ORAL_TABLET | ORAL | Status: DC | PRN
  Administered 2016-02-26 – 2016-02-27 (×3): 650 mg via ORAL
  Filled 2016-02-26 (×3): qty 2

## 2016-02-26 MED ORDER — HYDROCHLOROTHIAZIDE 25 MG PO TABS
25.0000 mg | ORAL_TABLET | Freq: Every day | ORAL | Status: DC
Start: 2016-02-27 — End: 2016-02-27
  Administered 2016-02-27: 25 mg via ORAL
  Filled 2016-02-26: qty 1

## 2016-02-26 MED ORDER — LIDOCAINE HCL (PF) 1 % IJ SOLN
INTRAMUSCULAR | Status: DC | PRN
  Administered 2016-02-26: 54 mL

## 2016-02-26 MED ORDER — DIAZEPAM 2 MG PO TABS
2.0000 mg | ORAL_TABLET | Freq: Two times a day (BID) | ORAL | Status: DC | PRN
  Administered 2016-02-26: 2 mg via ORAL
  Filled 2016-02-26: qty 1

## 2016-02-26 MED ORDER — SODIUM CHLORIDE 0.9 % IR SOLN
80.0000 mg | Status: AC
  Administered 2016-02-26: 80 mg

## 2016-02-26 MED ORDER — MIDAZOLAM HCL 5 MG/5ML IJ SOLN
INTRAMUSCULAR | Status: AC
  Filled 2016-02-26: qty 5

## 2016-02-26 MED ORDER — ONDANSETRON HCL 4 MG/2ML IJ SOLN: 4.0000 mg | Freq: Four times a day (QID) | INTRAMUSCULAR | Status: DC | PRN

## 2016-02-26 MED ORDER — MIDAZOLAM HCL 5 MG/5ML IJ SOLN
INTRAMUSCULAR | Status: DC | PRN
  Administered 2016-02-26 (×4): 1 mg via INTRAVENOUS

## 2016-02-26 MED ORDER — NORTRIPTYLINE HCL 10 MG PO CAPS
20.0000 mg | ORAL_CAPSULE | Freq: Every day | ORAL | Status: DC
  Administered 2016-02-26: 20 mg via ORAL
  Filled 2016-02-26: qty 2

## 2016-02-26 MED ORDER — CEFAZOLIN SODIUM-DEXTROSE 2-4 GM/100ML-% IV SOLN
2.0000 g | INTRAVENOUS | Status: AC
  Administered 2016-02-26: 2 g via INTRAVENOUS

## 2016-02-26 MED ORDER — SODIUM CHLORIDE 0.9 % IV SOLN: 250.0000 mL | INTRAVENOUS | Status: DC

## 2016-02-26 MED ORDER — HEPARIN (PORCINE) IN NACL 2-0.9 UNIT/ML-% IJ SOLN
INTRAMUSCULAR | Status: DC | PRN
  Administered 2016-02-26: 17:00:00

## 2016-02-26 MED ORDER — NITROGLYCERIN 0.4 MG SL SUBL: 0.4000 mg | SUBLINGUAL_TABLET | SUBLINGUAL | Status: DC | PRN

## 2016-02-26 MED ORDER — CEFAZOLIN SODIUM-DEXTROSE 2-4 GM/100ML-% IV SOLN
2.0000 g | Freq: Four times a day (QID) | INTRAVENOUS | Status: AC
  Administered 2016-02-26 – 2016-02-27 (×3): 2 g via INTRAVENOUS
  Filled 2016-02-26 (×3): qty 100

## 2016-02-26 MED ORDER — SODIUM CHLORIDE 0.9 % IV SOLN: INTRAVENOUS | Status: DC

## 2016-02-26 MED ORDER — FENTANYL CITRATE (PF) 100 MCG/2ML IJ SOLN
INTRAMUSCULAR | Status: DC | PRN
  Administered 2016-02-26 (×2): 25 ug via INTRAVENOUS

## 2016-02-26 MED ORDER — HEPARIN (PORCINE) IN NACL 2-0.9 UNIT/ML-% IJ SOLN
INTRAMUSCULAR | Status: AC
  Filled 2016-02-26: qty 500

## 2016-02-26 MED ORDER — SODIUM CHLORIDE 0.9 % IR SOLN
Status: AC
  Filled 2016-02-26: qty 2

## 2016-02-26 MED ORDER — SODIUM CHLORIDE 0.9% FLUSH: 3.0000 mL | Freq: Two times a day (BID) | INTRAVENOUS | Status: DC

## 2016-02-26 MED ORDER — FENTANYL CITRATE (PF) 100 MCG/2ML IJ SOLN
INTRAMUSCULAR | Status: AC
  Filled 2016-02-26: qty 2

## 2016-02-26 MED ORDER — HEPARIN SODIUM (PORCINE) 1000 UNIT/ML IJ SOLN
INTRAMUSCULAR | Status: AC
  Filled 2016-02-26: qty 1

## 2016-02-26 MED ORDER — LIDOCAINE HCL (PF) 1 % IJ SOLN
INTRAMUSCULAR | Status: AC
  Filled 2016-02-26: qty 60

## 2016-02-26 MED ORDER — SODIUM CHLORIDE 0.9% FLUSH: 3.0000 mL | INTRAVENOUS | Status: DC | PRN

## 2016-02-26 MED ORDER — CEFAZOLIN SODIUM-DEXTROSE 2-4 GM/100ML-% IV SOLN
INTRAVENOUS | Status: AC
  Filled 2016-02-26: qty 100

## 2016-02-26 MED ORDER — CHLORHEXIDINE GLUCONATE 4 % EX LIQD: 60.0000 mL | Freq: Once | CUTANEOUS | Status: DC

## 2016-02-26 SURGICAL SUPPLY — 9 items
CABLE SURGICAL S-101-97-12 (CABLE) ×2 IMPLANT
GUIDEWIRE ANGLED .035X150CM (WIRE) ×2 IMPLANT
LEAD TENDRIL MRI 46CM LPA1200M (Lead) ×2 IMPLANT
LEAD TENDRIL MRI 52CM LPA1200M (Lead) ×2 IMPLANT
PACEMAKER ASSURITY DR-RF (Pacemaker) ×2 IMPLANT
PAD DEFIB LIFELINK (PAD) ×2 IMPLANT
SHEATH CLASSIC 8F (SHEATH) ×4 IMPLANT
TRAY PACEMAKER INSERTION (PACKS) ×2 IMPLANT
WIRE HI TORQ VERSACORE-J 145CM (WIRE) ×2 IMPLANT

## 2016-02-26 NOTE — Progress Notes (Signed)
Dr. Karleen Hampshire T. Alleyne Lac, MD, CAQ Sports [facility] [address] [place], [address] Phone: [phone] Fax: [phone]  02/26/2016  Patient: Anne Rush, MRN: [id number], DOB: 1960/11/03, 55 y.o.  Primary Physician:  [name], MD   Chief Complaint  Patient presents with  . Bradycardia  . Fatigue   Subjective:   Anne Rush is a 55 y.o. very pleasant female patient who presents with the following:  Known well, generally stable interstitial lung disease with moderately poorly controlled hypertension who presents feeling quite poorly with a baseline pulse from 100 - 110 now running 40-48 for approx 1 week.  She is on a TCA: Pamelor 20 mg at night  Does not feel right Pulse was never above 48 Felt kind of sick.  Some pain in head, pain in the L shoulder yesterday.  Nauseated.  Could feel her heart pounding in her chest.  Feet were very cold.   Just felt horrible Shortness of breath with exertion, too.   No active chest pain.  Past Medical History, Surgical History, Social History, Family History, Problem List, Medications, and Allergies have been reviewed and updated if relevant.  Patient Active Problem List   Diagnosis Date Noted  . Subclinical hyperthyroidism 06/17/2014  . Obesity (BMI 30-39.9) 04/28/2014  . INTERSTITIAL LUNG DISEASE 08/27/2010  . LOW BACK PAIN, ACUTE 08/12/2009  . Former tobacco use 08/28/2008  . Hyperlipidemia LDL goal <100 06/21/2007  . GAD (generalized anxiety disorder) 06/05/2007  . Essential hypertension 06/05/2007  . ALLERGIC RHINITIS 06/05/2007    Past Medical History  Diagnosis Date  . Pollen allergies   . Interstitial lung disease (HCC)   . Anxiety   . Hyperlipidemia   . Hypertension   . Squamous cell carcinoma (HCC) 2014    skin cancer  . Subclinical hyperthyroidism 06/17/2014    Past Surgical History  Procedure Laterality Date  . Cosmetic surgery    . Tubal ligation       Social History   Social History  . Marital Status: Married    Spouse Name: N/A  . Number of Children: 1  . Years of Education: N/A   Occupational History  . house wife    Social History Main Topics  . Smoking status: Former Games developer  . Smokeless tobacco: Never Used  . Alcohol Use: No  . Drug Use: No  . Sexual Activity: Not on file   Other Topics Concern  . Not on file   Social History Narrative   Regular exercise-no    Family History  Problem Relation Age of Onset  . Anxiety disorder Mother   . Hypertension Mother   . Cancer Maternal Aunt     breast  . Heart failure Maternal Grandfather     Allergies  Allergen Reactions  . Codeine     REACTION: hives, itching  . Oxycodone-Acetaminophen     REACTION: unspecified  . Paroxetine   . Sulfonamide Derivatives     REACTION: unspecified    Medication list reviewed and updated in full in [facility].   GEN: No acute illnesses, no fevers, chills. GI: No n/v/d, eating normally Pulm: SOB WITH EXERTION FOR 3 WEEKS Interactive and getting along well at home. Otherwise, the pertinent positives and negatives are listed above and in the HPI, otherwise a full review of systems has been reviewed and is negative unless noted positive.   Objective:   BP 158/84 mmHg  Pulse 49  Temp(Src) 97.5  F (36.4 C) (Oral)  Ht  (1.6 m)  Wt 192 lb (87.091 kg)  BMI 34.02 kg/m2  SpO2 98%  GEN: WDWN, NAD, Non-toxic, A & O x 3 HEENT: Atraumatic, Normocephalic. Neck supple. No masses, No LAD. Ears and Nose: No external deformity. CV: RRR, No M/G/R. No JVD. No thrill. No extra heart sounds. PULM: CTA B, no wheezes, crackles, rhonchi. No retractions. No resp. distress. No accessory muscle use. EXTR: No c/c/e NEURO Normal gait.  PSYCH: Normally interactive. Conversant. Not depressed or anxious appearing.  Calm demeanor.   Laboratory and Imaging Data:  Assessment and Plan:   AV block, 3rd degree (HCC)  Bradycardia -  Plan: EKG 12-Lead  Dyspnea  >40 minutes spent in face to face time with patient, >50% spent in counselling or coordination of care   EKG: P waves and QRS complex are out of sync.  I reviewed EKG with 2 of my partners and Dr. Ernestine Mcmurray via telephone. All agreed, high-degree AV block.   We will admit to [facility] where EP is available.  Patient is going via private vehicle with husband driving.   I spoke to inpatient Cardiology and ER Triage.   Follow-up: No Follow-up on file.  Orders Placed This Encounter  Procedures  . EKG 12-Lead    Signed,  Ramonte Mena T. Dineen Conradt, MD   Patient's Medications  New Prescriptions   No medications on file  Previous Medications   DIAZEPAM (VALIUM) 5 MG TABLET    Take 0.5-1 tablets (2.5-5 mg total) by mouth 2 (two) times daily as needed for anxiety.   HYDROCHLOROTHIAZIDE (HYDRODIURIL) 25 MG TABLET    TAKE 1 TABLET (25 MG TOTAL) BY MOUTH DAILY.   NORTRIPTYLINE (PAMELOR) 10 MG CAPSULE    Take 20 mg by mouth at bedtime.   Modified Medications   No medications on file  Discontinued Medications   METOPROLOL SUCCINATE (TOPROL-XL) 25 MG 24 HR TABLET    Take 1 tablet (25 mg total) by mouth daily.   PREDNISOLONE ACETATE (PRED FORTE) 1 % OPHTHALMIC SUSPENSION       TRIAMCINOLONE CREAM (KENALOG) 0.1 %

## 2016-02-26 NOTE — Progress Notes (Signed)
Pre visit review using our clinic review tool, if applicable. No additional management support is needed unless otherwise documented below in the visit note. 

## 2016-02-26 NOTE — ED Provider Notes (Signed)
Please delete this. Duplicate note. I have written a note on this patient today  Doug Sou, MD 02/27/16 [phone]

## 2016-02-26 NOTE — ED Notes (Signed)
Pt here for weakness, fatigue, nausea and left shoulder pain that started yesterday. Sent here by doctor for heart block. HR 50 at triage and BP 206/93.

## 2016-02-26 NOTE — Progress Notes (Signed)
Orthopedic Tech Progress Note Patient Details:  Anne Rush 05/27/1961 [id number]  Patient ID: Anne Rush, female   DOB: August 25, 1961, 55 y.o.   MRN: [id number] Pt already had on arm sling  Trinna Post 02/26/2016, 9:29 PM

## 2016-02-26 NOTE — ED Notes (Signed)
Erin RN made aware of elevated BP

## 2016-02-26 NOTE — Discharge Summary (Signed)
ELECTROPHYSIOLOGY PROCEDURE DISCHARGE SUMMARY    Patient ID: Anne Rush,  MRN: [id number], DOB/AGE: Feb 18, 1961 55 y.o.  Admit date: 02/26/2016 Discharge date: 02/27/16  Primary Care Physician: Anne Beat, Rush Primary Cardiologist/Electrophysiologist:  (new) Anne Rush  Primary Discharge Diagnosis:  1. CHB, no reversible causes identified     s/p pacemaker implantation this admission  Secondary Discharge Diagnosis:  1. HTN 2. Anxiety  Allergies  Allergen Reactions  . Codeine     REACTION: hives, itching  . Oxycodone-Acetaminophen     REACTION: unspecified  . Paroxetine   . Sulfonamide Derivatives     REACTION: unspecified     Procedures This Admission:  1.  Implantation of a STJ dual chamber PPM on 02/26/16 by Dr Anne Rush.  The patient received a Public house manager Assurity MRI model Q014132 (serial number [id number]) pacemaker, and St Jude Medical model 1200M (serial number [id number]) right atrial lead and a St Jude Medical model 1200M (serial number [id number]) right ventricular lead. There were no immediate post procedure complications. 2.  CXR on 02/27/16 demonstrated no pneumothorax status post device implantation.   Brief HPI: Anne Rush is a 55 y.o. female was referred to the ER by her PMD who noted her to be in CHB. She was seen in the ED by EP service no reversible causes were identified and she was admitted and underwent PPM implant  Hospital Course:  The patientwas seen by her PMD for feelings of generalized weakness and no energy, an unusal awareness of her heart pound, not fast or irregular, an occassional pain to the top of her L shoulder, no CP. She has noticed getting winded easily when walking up stairs that she has noticed, she has had dizzy spells, but no near syncope or syncope. She had been having some less notable but unusual for her similar fatigue for a few weeks.  She was found to be in CHB by her PMD and referred to the ER, no reversible causes  for her CHB were identified and after lengthy discussion by Anne Rush with the patient and her husband at bedside, she was agreeable to proceed.  Anne Rush did a bedside quick-look echo noting preserved EF and proceeded with implantation of a PPM with details as outlined above.  Official echo done this morning, Anne Rush plan to f/u out patient.   She  was monitored on telemetry overnight which demonstrated SR, generally 90's-100 with Vpacing.  Left chest was without hematoma or ecchymosis.  The device was interrogated and found to be functioning normally.  CXR was obtained and demonstrated no pneumothorax status post device implantation.  Wound care, arm mobility, and restrictions were reviewed with the patient.  The patient was examined by Anne Rush and considered stable for discharge to home. The patient is asked to monitor her BP keeping a daily log for out patient f/u.  The patient's echo result came back prior to her discharge, EF 35-40%, + WMA.  Discussed with Anne Rush, start BB/ACE therapy, possibly multifactorial with her HTN, CHB, paced rhythm.  Anne Rush plan to repeat her echo in 3 months at her f/u visit.  Discussed with the patient and her husband at bedside echo results and new medicines.    Physical Exam: Filed Vitals:   02/26/16 1806 02/26/16 1811 02/26/16 1836 02/27/16 0413  BP: 146/99 156/103 159/105 143/83  Pulse: 110 110 114 92  Temp:   98.1 F (36.7 C) 97.7 F (36.5 C)  TempSrc:   Oral Oral  Resp: 19 2 18 16   SpO2: 98% 96% 98% 99%    GEN- The patient is well appearing, alert and oriented x 3 today.   HEENT: normocephalic, atraumatic; sclera clear, conjunctiva pink; hearing intact; oropharynx clear; neck supple, no JVP Lungs- Clear to ausculation bilaterally, normal work of breathing.  No wheezes, rales, rhonchi Heart- Regular rate and rhythm, no murmurs, rubs or gallops, PMI not laterally displaced GI- soft, non-tender, non-distended, bowel sounds present, no  hepatosplenomegaly Extremities- no clubbing, cyanosis, or edema MS- no significant deformity or atrophy Skin- warm and dry, no rash or lesion, left chest without hematoma/ecchymosis Psych- euthymic mood, full affect Neuro- no gross deficits   Labs:   Lab Results  Component Value Date   WBC 7.9 02/26/2016   HGB 15.8* 02/26/2016   HCT 45.3 02/26/2016   MCV 89.2 02/26/2016   PLT 236 02/26/2016     Recent Labs Lab 02/26/16 1548  NA 138  K 3.5  CL 102  CO2 26  BUN 7  CREATININE 0.68  CALCIUM 9.9  GLUCOSE 108*    Discharge Medications:    Medication List    TAKE these medications        aspirin EC 81 MG tablet  Take 81 mg by mouth once.     carvedilol 3.125 MG tablet  Commonly known as:  COREG  Take 1 tablet (3.125 mg total) by mouth 2 (two) times daily with a meal.     diazepam 5 MG tablet  Commonly known as:  VALIUM  Take 0.5-1 tablets (2.5-5 mg total) by mouth 2 (two) times daily as needed for anxiety.     hydrochlorothiazide 25 MG tablet  Commonly known as:  HYDRODIURIL  TAKE 1 TABLET (25 MG TOTAL) BY MOUTH DAILY.     lisinopril 2.5 MG tablet  Commonly known as:  ZESTRIL  Take 1 tablet (2.5 mg total) by mouth daily.     nortriptyline 10 MG capsule  Commonly known as:  PAMELOR  Take 20 mg by mouth at bedtime.        Disposition:  Home Discharge Instructions    Diet - low sodium heart healthy    Complete by:  As directed      Increase activity slowly    Complete by:  As directed           Follow-up Information    Follow up with Anne Rush On 03/04/2016.   Specialty:  Cardiology   Why:  4:00PM, wound check   Contact information:   [address] [place] [address] [phone]      Follow up with Anne Rush On 06/01/2016.   Specialty:  Cardiology   Why:  9:30AM   Contact information:   [address] [place] [address] [phone]       Duration of  Discharge Encounter: Greater than 30 minutes including physician time.  Signed, Anne Dowse, PA-C 02/27/2016 12:30 PM     I have seen and examined this patient with Anne Rush.  Agree with above, note added to reflect my findings.  On exam, regular rhythm, no murmurs, lungs clear.  Had dual chamber pacemaker placed for complete AV block.  Found to have EF of 35-40% on TTE.  Plan for coreg and lisinopril as treatment for systolic dysfunction.  Princella Jaskiewicz plan on follow up in device clinic in 10 days.  CXR and device interrogation without issues.    Maven Rosander  Hal Morales Rush 02/27/2016 1:58 PM

## 2016-02-26 NOTE — H&P (Signed)
H&P    Patient ID: Anne Rush Isais MRN: [id number], DOB/AGE: 53-09-1960 53 y.o.  Admit date: 02/26/2016 Date of Admit; 02/26/2016   Primary Physician: [name], MD Primary Cardiologist: new  Reason for Consultation: CHB  HPI: Garnett Rekowski Leopard is a 55 y.o. female was seen by her PMD for feelings of generalized weakness and no energy, an unusal awareness of her heart pound, not fast or irregular, an occassional pain to the top of her L shoulder, no CP.  She has noticed getting winded easily when walking up stairs that she has noticed, she has had dizzy spells, but no near syncope or syncope.  No recent illness or fever, she noted a tic on her about a month ago, no rashes.  PMHx includes, HTN, ILD, anxiety, HLD, low back pain, former smoker <1ppd x30 years, none in 5 years, post menopausal.  Home medicines include Pamelor and HCTZ only Historically rx metoprolol, the patient states this was prescribed but never started   LABS: BUN/Creat 17/0.75 K+ 3.8 Trop <0.03 H/H 15/45, WBC 7.29 April 2015: TSH wnl 1.56  Past Medical History  Diagnosis Date  . Pollen allergies   . Interstitial lung disease (HCC)   . Anxiety   . Hyperlipidemia   . Hypertension   . Squamous cell carcinoma (HCC) 2014    skin cancer  . Subclinical hyperthyroidism 06/17/2014     Surgical History:  Past Surgical History  Procedure Laterality Date  . Cosmetic surgery    . Tubal ligation        (Not in a hospital admission)  Inpatient Medications:   Allergies:  Allergies  Allergen Reactions  . Codeine     REACTION: hives, itching  . Oxycodone-Acetaminophen     REACTION: unspecified  . Paroxetine   . Sulfonamide Derivatives     REACTION: unspecified    Social History   Social History  . Marital Status: Married    Spouse Name: N/A  . Number of Children: 1  . Years of Education: N/A   Occupational History  . house wife    Social History Main Topics  . Smoking status: Former Games developer    . Smokeless tobacco: Never Used  . Alcohol Use: No  . Drug Use: No  . Sexual Activity: Not on file   Other Topics Concern  . Not on file   Social History Narrative   Regular exercise-no     Family History  Problem Relation Age of Onset  . Anxiety disorder Mother   . Hypertension Mother   . Cancer Maternal Aunt     breast  . Heart failure Maternal Grandfather      Review of Systems: All other systems reviewed and are otherwise negative except as noted above.  Physical Exam: Filed Vitals:   02/26/16 1359 02/26/16 1400  BP: 206/93 206/93  Pulse: 50 50  Temp: 98.3 F (36.8 C) 98.3 F (36.8 C)  TempSrc: Oral Oral  Resp: 18 18  SpO2: 100% 99%    GEN- The patient is well appearing, alert and oriented x 3 today, she is in NAD but verbalizes feeling anxious.   HEENT: normocephalic, atraumatic; sclera clear, conjunctiva pink; hearing intact; oropharynx clear; neck supple, no JVP Lymph- no cervical lymphadenopathy Lungs- Clear to ausculation bilaterally, normal work of breathing.  No wheezes, rales, rhonchi Heart- Regular rate and rhythm, bradycardic, no murmurs, rubs or gallops, PMI not laterally displaced GI- soft, non-tender, non-distended, bowel sounds present Extremities- no clubbing, cyanosis, or edema  MS- no significant deformity or atrophy Skin- warm and dry, no rash or lesion Psych- euthymic mood, full affect Neuro- no gross deficits observed  Labs:   Lab Results  Component Value Date   WBC 14.0* 05/22/2015   HGB 15.4 05/22/2015   HCT 45.0 05/22/2015   MCV 88.8 05/22/2015   PLT 257 05/22/2015   No results for input(s): NA, K, CL, CO2, BUN, CREATININE, CALCIUM, PROT, BILITOT, ALKPHOS, ALT, AST, GLUCOSE in the last 168 hours.  Invalid input(s): LABALBU    Radiology/Studies:  Dg Chest 2 View 02/26/2016  CLINICAL DATA:  Chest pain for 6 days and bradycardia. Shortness of breath on exertion EXAM: CHEST  2 VIEW COMPARISON:  Chest radiograph August 27, 2010  and chest CT October 08, 2010 FINDINGS: There is generalized interstitial prominence, stable. There is no frank edema or consolidation. There is mild scarring in the left base. Heart size and pulmonary vascularity are normal. No adenopathy. No bone lesions. IMPRESSION: Generalized interstitial prominence, likely due to underlying fibrotic type change. No edema or consolidation. Mild scarring left base. No new opacity. Stable cardiac silhouette. Electronically Signed   By: Bretta Bang III M.D.   On: 02/26/2016 14:51    EKG: CHB, V rate 60 TELEMETRY: CHB, 60's-90's   Assessment and Plan:   1. CHB     No clear reversible causes     Reino Lybbert get labs, echo stat Dr. Elberta Fortis spoke at length with the patient and her husband at the bedside.  Risks benefits of PPM implant discussed.       Dr. Elberta Fortis did bedside quick look echo to assess EF, appearing wnl.  2. HTN    Usually better controlled    187/82 currently  3. anxiety    Signed, Francis Dowse, PA-C 02/26/2016 3:22 PM   I have seen and examined this patient with Francis Dowse.  Agree with above, note added to reflect my findings.  On exam, regular rhythm, no murmurs, lungs clear.  Presented to her PCP with weakness and fatigue, heart pounding.  Found to be in compelte AV block.  No medications that would be a cause.  Plan for dual chamber pacemaker placement.  Risks and benefits explained.  Risks include bleeding, tamponade, heart block, pneumothorax.  The patient understands these risks and has agreed to the procedure.    Chela Sutphen M. Shene Maxfield MD 02/26/2016 4:32 PM

## 2016-02-26 NOTE — ED Provider Notes (Signed)
CSN: [id number]     Arrival date & time 02/26/16  1350 History   First MD Initiated Contact with Patient 02/26/16 1519     Chief Complaint  Patient presents with  . Weakness  . Bradycardia     (Consider location/radiation/quality/duration/timing/severity/associated sxs/prior Treatment) HPI history of interstitial lung disease, hypertension, hyperlipidemia, here for evaluation of weakness and bradycardia. Patient reports she has not felt well and has been fatigued and short of breath over the past 1.5 months. However, she reports feeling much worse yesterday with associated shortness of breath and fatigue. She reports intermittent chest discomfort ongoing up steps. She was seen by her PCP today, found to have possible third-degree block on ECG and sent to emergency department. Emergency department, she reports that she feels anxious, but denies any chest pain, shortness of breath or palpitations. No fevers, chills, cough, leg swelling. No anticoagulation. No other modifying factors.  Past Medical History  Diagnosis Date  . Pollen allergies   . Interstitial lung disease (HCC)   . Anxiety   . Hyperlipidemia   . Hypertension   . Squamous cell carcinoma (HCC) 2014    skin cancer  . Subclinical hyperthyroidism 06/17/2014   Past Surgical History  Procedure Laterality Date  . Cosmetic surgery    . Tubal ligation     Family History  Problem Relation Age of Onset  . Anxiety disorder Mother   . Hypertension Mother   . Cancer Maternal Aunt     breast  . Heart failure Maternal Grandfather    Social History  Substance Use Topics  . Smoking status: Former Games developer  . Smokeless tobacco: Never Used  . Alcohol Use: No   OB History    No data available     Review of Systems A 10 point review of systems was completed and was negative except for pertinent positives and negatives as mentioned in the history of present illness     Allergies  Codeine; Oxycodone-acetaminophen; Paroxetine; and  Sulfonamide derivatives  Home Medications   Prior to Admission medications   Medication Sig Start Date End Date Taking? Authorizing Provider  diazepam (VALIUM) 5 MG tablet Take 0.5-1 tablets (2.5-5 mg total) by mouth 2 (two) times daily as needed for anxiety. 06/04/15   Spencer Copland, MD  hydrochlorothiazide (HYDRODIURIL) 25 MG tablet TAKE 1 TABLET (25 MG TOTAL) BY MOUTH DAILY. 08/05/15   [name], MD  nortriptyline (PAMELOR) 10 MG capsule Take 20 mg by mouth at bedtime.  07/28/15   Historical Provider, MD   BP 216/88 mmHg  Pulse 92  Temp(Src) 98.4 F (36.9 C) (Oral)  Resp 18  SpO2 100% Physical Exam  Constitutional: She is oriented to person, place, and time. She appears well-developed and well-nourished.  She is in no distress, but appears somewhat anxious.  HENT:  Head: Normocephalic and atraumatic.  Mouth/Throat: Oropharynx is clear and moist.  Eyes: Conjunctivae are normal. Pupils are equal, round, and reactive to light. Right eye exhibits no discharge. Left eye exhibits no discharge. No scleral icterus.  Neck: Neck supple.  Cardiovascular: Normal rate, regular rhythm and normal heart sounds.   Pulmonary/Chest: Effort normal and breath sounds normal. No respiratory distress. She has no wheezes. She has no rales.  Abdominal: Soft. There is no tenderness.  Musculoskeletal: Normal range of motion. She exhibits no edema or tenderness.  Neurological: She is alert and oriented to person, place, and time.  Cranial Nerves II-XII grossly intact  Skin: Skin is warm and dry. No rash noted.  Psychiatric: She has a normal mood and affect.  Nursing note and vitals reviewed.   ED Course  Procedures (including critical care time) Labs Review Labs Reviewed  BASIC METABOLIC PANEL - Abnormal; Notable for the following:    Glucose, Bld 108 (*)    All other components within normal limits  CBC - Abnormal; Notable for the following:    Hemoglobin 15.8 (*)    All other components  within normal limits  I-STAT TROPOININ, ED    Imaging Review Dg Chest 2 View  02/26/2016  CLINICAL DATA:  Chest pain for 6 days and bradycardia. Shortness of breath on exertion EXAM: CHEST  2 VIEW COMPARISON:  Chest radiograph August 27, 2010 and chest CT October 08, 2010 FINDINGS: There is generalized interstitial prominence, stable. There is no frank edema or consolidation. There is mild scarring in the left base. Heart size and pulmonary vascularity are normal. No adenopathy. No bone lesions. IMPRESSION: Generalized interstitial prominence, likely due to underlying fibrotic type change. No edema or consolidation. Mild scarring left base. No new opacity. Stable cardiac silhouette. Electronically Signed   By: Bretta Bang III M.D.   On: 02/26/2016 14:51   I have personally reviewed and evaluated these images and lab results as part of my medical decision-making.   EKG Interpretation   Date/Time:  Thursday February 26 2016 13:58:21 EDT Ventricular Rate:  106 PR Interval:    QRS Duration: 96 QT Interval:  308 QTC Calculation: 409 R Axis:   62 Text Interpretation:  ST \\T \ New since previous tracing Abnormal ECG  Confirmed by Ethelda Chick  MD, SAM [id number]) on 02/26/2016 3:52:21 PM     CRITICAL CARE Performed by: Sharlene Motts   Total critical care time: 32 minutes  Critical care time was exclusive of separately billable procedures and treating other patients.  Critical care was necessary to treat or prevent imminent or life-threatening deterioration.  Critical care was time spent personally by me on the following activities: development of treatment plan with patient and/or surrogate as well as nursing, discussions with consultants, evaluation of patient's response to treatment, examination of patient, obtaining history from patient or surrogate, ordering and performing treatments and interventions, ordering and review of laboratory studies, ordering and review of radiographic  studies, pulse oximetry and re-evaluation of patient's condition.  MDM  Patient with history of hypertension, hyperlipidemia interstitial lung disease here for Shortness breath, fatigue and ECG changes. ECG on arrival is concerning for third-degree AV block. She does have some anxiety, but is otherwise asymptomatic in emergency department. Pending screening labs. Chest x-ray with generalized interstitial prominence likely due to fibrotic type change, no other new findings. Discussed with my attending, Dr. Ethelda Chick. Will consult cardiology/EP. Janean Sark PA-C to see in emergency department. Patient admitted to cardiology service. Final diagnoses:  Third degree heart block [facility])  Other fatigue  Shortness of breath        Joycie Peek, PA-C 02/26/16 1646  Doug Sou, MD 02/27/16 [phone]

## 2016-02-26 NOTE — Progress Notes (Signed)
Patient arrived the unit from the cath lab, assessment completed see flowsheet, placed on tele, ccmd notified, patient oriented to room and staff, bed in lowest position, call light within reach will continue to monitor

## 2016-02-26 NOTE — ED Provider Notes (Signed)
Patient had extreme fatigue and generalized weakness yesterday and 3 episodes of pain at the top of her left shoulder which lasted anywhere from 5-15 minutes. She went to see her primary care physician today and was noted to be in third-degree heart block. No treatment prior to coming here. Nothing makes symptoms better or worse. No pain today. Presently complains of generalized weakness. No other associated symptoms. Nothing makes symptoms better or worse. Chest x-ray viewed by me. Cardiology service consulted and saw patient in the emergency department  Doug Sou, MD 02/27/16 [phone]

## 2016-02-26 NOTE — Discharge Instructions (Signed)
° ° °  Supplemental Discharge Instructions for  °Pacemaker/Defibrillator Patients ° °Activity °No heavy lifting or vigorous activity with your left/right arm for 6 to 8 weeks.  Do not raise your left/right arm above your head for one week.  Gradually raise your affected arm as drawn below. ° °        °    03/01/16                    03/02/16                    03/03/16                   03/04/16 °__ ° °NO DRIVING for  1 week   ; you may begin driving on  03/04/16  . ° °WOUND CARE °- Keep the wound area clean and dry.  Do not get this area wet for one week. No showers for one week; you may shower on  03/04/16   . °- The tape/steri-strips on your wound will fall off; do not pull them off.  No bandage is needed on the site.  DO  NOT apply any creams, oils, or ointments to the wound area. °- If you notice any drainage or discharge from the wound, any swelling or bruising at the site, or you develop a fever > 101? F after you are discharged home, call the office at once. ° °Special Instructions °- You are still able to use cellular telephones; use the ear opposite the side where you have your pacemaker/defibrillator.  Avoid carrying your cellular phone near your device. °- When traveling through airports, show security personnel your identification card to avoid being screened in the metal detectors.  Ask the security personnel to use the hand wand. °- Avoid arc welding equipment, MRI testing (magnetic resonance imaging), TENS units (transcutaneous nerve stimulators).  Call the office for questions about other devices. °- Avoid electrical appliances that are in poor condition or are not properly grounded. °- Microwave ovens are safe to be near or to operate. ° °Additional information for defibrillator patients should your device go off: °- If your device goes off ONCE and you feel fine afterward, notify the device clinic nurses. °- If your device goes off ONCE and you do not feel well afterward, call 911. °- If your device goes  off TWICE, call 911. °- If your device goes off THREE times in one day, call 911. ° °DO NOT DRIVE YOURSELF OR A FAMILY MEMBER °WITH A DEFIBRILLATOR TO THE HOSPITAL--CALL 911. ° °

## 2016-02-27 ENCOUNTER — Encounter (HOSPITAL_COMMUNITY): Payer: Self-pay | Admitting: Cardiology

## 2016-02-27 ENCOUNTER — Ambulatory Visit: Payer: BLUE CROSS/BLUE SHIELD | Admitting: Internal Medicine

## 2016-02-27 ENCOUNTER — Observation Stay (HOSPITAL_COMMUNITY): Payer: BLUE CROSS/BLUE SHIELD

## 2016-02-27 ENCOUNTER — Telehealth: Payer: Self-pay | Admitting: Cardiology

## 2016-02-27 ENCOUNTER — Observation Stay (HOSPITAL_BASED_OUTPATIENT_CLINIC_OR_DEPARTMENT_OTHER): Payer: BLUE CROSS/BLUE SHIELD

## 2016-02-27 ENCOUNTER — Other Ambulatory Visit: Payer: Self-pay | Admitting: Family Medicine

## 2016-02-27 DIAGNOSIS — I442 Atrioventricular block, complete: Secondary | ICD-10-CM

## 2016-02-27 DIAGNOSIS — Z950 Presence of cardiac pacemaker: Secondary | ICD-10-CM | POA: Diagnosis not present

## 2016-02-27 DIAGNOSIS — E785 Hyperlipidemia, unspecified: Secondary | ICD-10-CM | POA: Diagnosis not present

## 2016-02-27 DIAGNOSIS — R001 Bradycardia, unspecified: Secondary | ICD-10-CM | POA: Diagnosis not present

## 2016-02-27 DIAGNOSIS — I10 Essential (primary) hypertension: Secondary | ICD-10-CM | POA: Diagnosis not present

## 2016-02-27 LAB — ECHOCARDIOGRAM COMPLETE
E decel time: 124 msec
FS: 26 % — AB (ref 28–44)
Height: 63 in
IVS/LV PW RATIO, ED: 1.08
LA ID, A-P, ES: 28 mm
LA diam end sys: 28 mm
LA diam index: 1.47 cm/m2
LA vol A4C: 19.8 ml
LA vol index: 16.1 mL/m2
LA vol: 30.5 mL
LV PW d: 7.27 mm — AB (ref 0.6–1.1)
LV dias vol index: 50 mL/m2
LV dias vol: 95 mL (ref 46–106)
LV sys vol index: 24 mL/m2
LV sys vol: 45 mL — AB (ref 14–42)
LVOT area: 4.91 cm2
LVOT diameter: 25 mm
MV Dec: 124
MV Peak grad: 4 mmHg
MV pk E vel: 98.5 m/s
Simpson's disk: 53
Stroke v: 50 ml
TAPSE: 15.8 mm
Weight: 3072 oz

## 2016-02-27 MED ORDER — CARVEDILOL 3.125 MG PO TABS: 3.1250 mg | ORAL_TABLET | Freq: Two times a day (BID) | ORAL | Status: DC

## 2016-02-27 MED ORDER — PERFLUTREN LIPID MICROSPHERE
1.0000 mL | INTRAVENOUS | Status: AC | PRN
  Administered 2016-02-27: 2 mL via INTRAVENOUS
  Filled 2016-02-27: qty 10

## 2016-02-27 MED ORDER — LISINOPRIL 2.5 MG PO TABS: 2.5000 mg | ORAL_TABLET | Freq: Every day | ORAL | Status: DC

## 2016-02-27 NOTE — Progress Notes (Signed)
Pt was discharged home with husband. CCMD was notified and telemetry box was removed. IV was removed with no complications. Pt received discharge instructions and all questions were answered. Pt left the unit via wheelchair and was accompanied by a Ferne Coe and pt's husband. Pt was in no distress at time of discharge.   Berdine Dance RN, BSN

## 2016-02-27 NOTE — Telephone Encounter (Signed)
The patient had a pacemaker and has some swelling in her hand.  No pain and only in the hand.  Sounds mild.  I asked them to keep it elevated but to come to the ED if it becomes painful or there are other symptoms or increasing swelling.  I will ask the pacemaker clinic to call her on Monday if she does not present for evaluation before that.

## 2016-02-27 NOTE — Telephone Encounter (Signed)
Informed that this is not necessarily abnormal after heart procedure yesterday.  Husband agreed but wanted to confirm. He reports patient HR back down and she is resting. Advised to monitor HR and if above 100 at rest and/or symptomatic with increased rate to call office to discuss. Patient's husband verbalized understanding and agreeable to plan.

## 2016-02-27 NOTE — Progress Notes (Signed)
Echocardiogram 2D Echocardiogram has been performed.  Marisue Humble 02/27/2016, 9:06 AM

## 2016-02-27 NOTE — Telephone Encounter (Signed)
Refill

## 2016-02-27 NOTE — Telephone Encounter (Signed)
New message      1. Has your device fired? no  2. Is you device beeping? no  3. Are you experiencing draining or swelling at device site? no  4. Are you calling to see if we received your device transmission? The pt just home and went up-stairs and the pt felt like the heart rate went up  5. Have you passed out? No   The pt is calling concerned about the fast heart rate from going up the stairs, the husband was calling for the pt now th pt is laying down and appears to be ok.  The pt just had the pacemaker put in and was just discharge from the hospital today.

## 2016-02-29 ENCOUNTER — Telehealth: Payer: Self-pay | Admitting: Physician Assistant

## 2016-02-29 NOTE — Telephone Encounter (Signed)
Anne Rush is a 55 y.o. female DC yesterday after PPM placed. Prior to DC, Echo with low EF and Coreg and Lisinopril added. She developed a rash today that is around her neck and upper chest.  It is pruritic. No lip or tongue swelling. No difficulty breathing or swallowing. I advised her to DC Lisinopril. She has triamcinolone cream at home.  She can use this sparingly qd to bid as needed for the next few days. I have asked her to take Benadryl 25 mg TID for the next few days. I will have our office call her tomorrow to follow up. Will leave alternative to ACE inhibitor to Dr. Loman Brooklyn. Tereso Newcomer, PA-C   02/29/2016 11:48 AM

## 2016-03-01 ENCOUNTER — Ambulatory Visit: Payer: Self-pay | Admitting: Family Medicine

## 2016-03-01 NOTE — Telephone Encounter (Signed)
Dr. Elberta Fortis called and spoke with the patient who reports she is doing much better. Patient understands we will call her with Dr. Gershon Crane recommendations on starting a different medication.

## 2016-03-02 MED ORDER — LOSARTAN POTASSIUM 50 MG PO TABS: 50.0000 mg | ORAL_TABLET | Freq: Every day | ORAL | Status: DC

## 2016-03-02 NOTE — Telephone Encounter (Addendum)
Advised patient to start Losartan 50 mg daily. Patient has appt on 6/27 to establish w/ PCP and advised to have BMP lab follow up then. Patient verbalized understanding and agreeable to plan.

## 2016-03-02 NOTE — Telephone Encounter (Signed)
Reached out to patient regarding left hand swelling. She reports that her hand is much improved and denies any redness, swelling, drainage from PPM site/ affected arm. She denies any further questions or concerns. She will follow up for her wound and PPM check 03/04/16.

## 2016-03-02 NOTE — Addendum Note (Signed)
Addended by: Baird Lyons on: 03/02/2016 04:30 PM   Modules accepted: Orders

## 2016-03-04 ENCOUNTER — Encounter: Payer: Self-pay | Admitting: Cardiology

## 2016-03-04 ENCOUNTER — Ambulatory Visit (INDEPENDENT_AMBULATORY_CARE_PROVIDER_SITE_OTHER): Payer: BLUE CROSS/BLUE SHIELD | Admitting: *Deleted

## 2016-03-04 DIAGNOSIS — I442 Atrioventricular block, complete: Secondary | ICD-10-CM | POA: Diagnosis not present

## 2016-03-04 DIAGNOSIS — Z950 Presence of cardiac pacemaker: Secondary | ICD-10-CM | POA: Diagnosis not present

## 2016-03-04 LAB — CUP PACEART INCLINIC DEVICE CHECK
Battery Remaining Longevity: 79.2
Battery Voltage: 3.01 V
Brady Statistic RA Percent Paced: 0.11 %
Brady Statistic RV Percent Paced: 99.99 %
Date Time Interrogation Session: 20170615170228
Implantable Lead Implant Date: 20170608
Implantable Lead Implant Date: 20170608
Implantable Lead Location: 753859
Implantable Lead Location: 753860
Lead Channel Impedance Value: 475 Ohm
Lead Channel Impedance Value: 687.5 Ohm
Lead Channel Pacing Threshold Amplitude: 0.75 V
Lead Channel Pacing Threshold Amplitude: 0.75 V
Lead Channel Pacing Threshold Pulse Width: 0.4 ms
Lead Channel Pacing Threshold Pulse Width: 0.4 ms
Lead Channel Sensing Intrinsic Amplitude: 5 mV
Lead Channel Setting Pacing Amplitude: 3.5 V
Lead Channel Setting Pacing Amplitude: 3.5 V
Lead Channel Setting Pacing Pulse Width: 0.4 ms
Lead Channel Setting Sensing Sensitivity: 2.5 mV
Pulse Gen Model: 2272
Pulse Gen Serial Number: 7903881

## 2016-03-04 NOTE — Progress Notes (Signed)
Wound check appointment. Steri-strips removed. Wound without redness or edema. Yellowish ecchymosis noted below and to the left of incision, soft to palpation. Incision edges approximated, wound well healed. Normal device function. Thresholds, sensing, and impedances consistent with implant measurements. Device programmed at 3.5V for extra safety margin until 3 month visit. Histogram distribution appropriate for patient and level of activity. No mode switches or high ventricular rates noted. Patient educated about wound care, arm mobility, lifting restrictions. ROV in 3 months with WC.

## 2016-03-16 ENCOUNTER — Ambulatory Visit (INDEPENDENT_AMBULATORY_CARE_PROVIDER_SITE_OTHER): Payer: BLUE CROSS/BLUE SHIELD | Admitting: Internal Medicine

## 2016-03-16 ENCOUNTER — Encounter: Payer: Self-pay | Admitting: Internal Medicine

## 2016-03-16 VITALS — BP 130/94 | HR 97 | Temp 97.7°F | Resp 12 | Ht 62.5 in | Wt 187.0 lb

## 2016-03-16 DIAGNOSIS — R5383 Other fatigue: Secondary | ICD-10-CM

## 2016-03-16 DIAGNOSIS — T783XXA Angioneurotic edema, initial encounter: Secondary | ICD-10-CM

## 2016-03-16 DIAGNOSIS — I10 Essential (primary) hypertension: Secondary | ICD-10-CM

## 2016-03-16 DIAGNOSIS — I442 Atrioventricular block, complete: Secondary | ICD-10-CM | POA: Diagnosis not present

## 2016-03-16 MED ORDER — FAMOTIDINE 20 MG PO TABS: 20.0000 mg | ORAL_TABLET | Freq: Two times a day (BID) | ORAL | Status: DC

## 2016-03-16 MED ORDER — MONTELUKAST SODIUM 10 MG PO TABS: 10.0000 mg | ORAL_TABLET | Freq: Every day | ORAL | Status: DC

## 2016-03-16 MED ORDER — LOSARTAN POTASSIUM 100 MG PO TABS: 100.0000 mg | ORAL_TABLET | Freq: Every day | ORAL | Status: DC

## 2016-03-16 NOTE — Progress Notes (Signed)
Subjective:  Patient ID: Anne Rush, female    DOB: November 06, 1960  Age: 55 y.o. MRN: [id number]  CC: The primary encounter diagnosis was Essential hypertension. Diagnoses of Other fatigue, Allergic angioedema, initial encounter, and Complete heart block ([place]) were also pertinent to this visit.  HPI Anne Rush presents for new patient evaluation.  Jun 8 was admitted to [facility] for bradycardia and dizziness secondary to CHB, no reversible causes  Found, S/p pacemaker implantation.    EF 35-40% with WMA noted on ECHO   Repeat ECHO  Is planned at 3 month follow up. Hypertension: coreg started  Along with lisinopril ,  Developed rash on Jun 11th  Lisinopril stopped and now taking losartan on since Jun 13th along with hctz.    Recurrent left sided facial swelling and left eye irritation started last summer. Treated for elevated ocular pressures and idiopathic inflammation by Optometry with prednisone .  Screening serologies were done to tulr out autoimmune syndromes ; all were negative, and ESR/CRP were normal as well.Anne Rush  MRI brain done to investigate reports of left sided facial numbness  And episodes  of stabbing pain in the left frontal area over the eyebrow. MRI  was normal except for nonspecific bilateral cerebral white matter hyperintense signal abnormalities suggestive of small vessel ischemia.   She continues to report that the  left side of face swells  whenever she eats beef, chicken and pork.  Symptoms do  not occur with fish .    Was referred to Neurology,  Saw Potter:  pamelor started for headaches. Patient frustrated,  Was not having headaches,  Was having eye pain   increasd dose of 30 mg caused left eye to become weak so she reduced her dose to 20 mg and the symptoms improved.   History of ILD diagnosed  In Jan 2012 ,diagnosed when Dasher saw clubbing of nails and patient reported shortness of breath with activity.  She was seen by Pulmonology and lung biopsy was advised but deferred.   She states that she was told her prognosis was 6 months .  2nd opinion with [name],  Spirometry normal,  Repeat CT scans eventually  showed resolution with baseline emphysema.      History [name] has a past medical history of Pollen allergies; Interstitial lung disease ([place]); Anxiety; Hyperlipidemia; Hypertension; Squamous cell carcinoma ([place]) (2014); and Subclinical hyperthyroidism (06/17/2014).   She has past surgical history that includes Cosmetic surgery; Tubal ligation; and Cardiac catheterization (N/A, 02/26/2016).   Her family history includes Anxiety disorder in her mother; Cancer in her maternal aunt; Heart failure in her maternal grandfather; Hypertension in her mother.She reports that she has quit smoking. She has never used smokeless tobacco. She reports that she does not drink alcohol or use illicit drugs.  Outpatient Prescriptions Prior to Visit  Medication Sig Dispense Refill  . carvedilol (COREG) 3.125 MG tablet Take 1 tablet (3.125 mg total) by mouth 2 (two) times daily with a meal. 60 tablet 6  . diazepam (VALIUM) 5 MG tablet Take 0.5-1 tablets (2.5-5 mg total) by mouth 2 (two) times daily as needed for anxiety. 60 tablet 1  . hydrochlorothiazide (HYDRODIURIL) 25 MG tablet TAKE 1 TABLET (25 MG TOTAL) BY MOUTH DAILY. 30 tablet 5  . hydrochlorothiazide (HYDRODIURIL) 25 MG tablet TAKE 1 TABLET (25 MG TOTAL) BY MOUTH DAILY. 30 tablet 6  . nortriptyline (PAMELOR) 10 MG capsule Take 20 mg by mouth at bedtime.     Anne Rush losartan (COZAAR) 50  MG tablet Take 1 tablet (50 mg total) by mouth daily. 30 tablet 3  . aspirin EC 81 MG tablet Take 81 mg by mouth once. Reported on 03/16/2016     No facility-administered medications prior to visit.    Review of Systems:  Patient denies headache, fevers, malaise, unintentional weight loss, skin rash, , sinus congestion and sinus pain, sore throat, dysphagia,  hemoptysis , cough, dyspnea, wheezing, chest pain, palpitations, orthopnea, edema,  abdominal pain, nausea, melena, diarrhea, constipation, flank pain, dysuria, hematuria, urinary  Frequency, nocturia, numbness, tingling, seizures,  Focal weakness, Loss of consciousness,  Tremor, insomnia, depression, anxiety, and suicidal ideation.     Objective:  BP 130/94 mmHg  Pulse 97  Temp(Src) 97.7 F (36.5 C) (Oral)  Resp 12  Ht 5' 2.5" (1.588 m)  Wt 187 lb (84.823 kg)  BMI 33.64 kg/m2  SpO2 99%  Physical Exam:  General appearance: alert, cooperative and appears stated age Ears: normal TM's and external ear canals both ears Throat: lips, mucosa, and tongue normal; teeth and gums normal Neck: no adenopathy, no carotid bruit, supple, symmetrical, trachea midline and thyroid not enlarged, symmetric, no tenderness/mass/nodules Back: symmetric, no curvature. ROM normal. No CVA tenderness. Lungs: clear to auscultation bilaterally Heart: regular rate and rhythm, S1, S2 normal, no murmur, click, rub or gallop Abdomen: soft, non-tender; bowel sounds normal; no masses,  no organomegaly Pulses: 2+ and symmetric Skin: Skin color, texture, turgor normal. No rashes or lesions Lymph nodes: Cervical, supraclavicular, and axillary nodes normal.   Assessment & Plan:   Problem List Items Addressed This Visit    Essential hypertension - Primary    Not well controlled.  Will increase losartan dose to 100 mg daily       Relevant Medications   losartan (COZAAR) 100 MG tablet   Other Relevant Orders   Comprehensive metabolic panel (Completed)   Complete heart block ([place])    Etiology unclear, now s/p pacemaker implantation during hospital admission Jun 9th .      Relevant Medications   losartan (COZAAR) 100 MG tablet   Allergic angioedema    Presumed,  By accounts of recurrent left sided facial swelling brought on by eating certain proteins.  Advised trial of daily 2nd generation antihistamine, Singulair,  and H2 blocker.  Return in one month .  Will consider allergy testing and  rheumatologic evaluation at follow up.        Other Visit Diagnoses    Other fatigue        Relevant Orders    CBC with Differential/Platelet (Completed)      A total of 45 minutes of face to face time was spent with patient more than half of which was spent in counselling and coordination of care  I have changed Ms. Murakami's losartan. I am also having her start on montelukast and famotidine. Additionally, I am having her maintain her diazepam, nortriptyline, hydrochlorothiazide, aspirin EC, hydrochlorothiazide, and carvedilol.  Meds ordered this encounter  Medications  . montelukast (SINGULAIR) 10 MG tablet    Sig: Take 1 tablet (10 mg total) by mouth at bedtime.    Dispense:  30 tablet    Refill:  1  . famotidine (PEPCID) 20 MG tablet    Sig: Take 1 tablet (20 mg total) by mouth 2 (two) times daily.    Dispense:  60 tablet    Refill:  1  . losartan (COZAAR) 100 MG tablet    Sig: Take 1 tablet (100 mg total) by mouth  daily.    Dispense:  30 tablet    Refill:  0    Stopped Lisinopril    Medications Discontinued During This Encounter  Medication Reason  . losartan (COZAAR) 50 MG tablet Reorder    Follow-up: Return in about 4 weeks (around 04/13/2016), or Rn vist one week for htn 4 weeks with Luwanda Starr .   Crecencio Mc, MD

## 2016-03-16 NOTE — Progress Notes (Signed)
Pre-visit discussion using our clinic review tool. No additional management support is needed unless otherwise documented below in the visit note.  

## 2016-03-16 NOTE — Patient Instructions (Addendum)
I recommend taking one of the following daily  Along with Singulair once daily and pepcid  20 mg ) or zantac 150 mg twice daily (pepcid is famotidine   Zantac is ranitidine ) :    Generic  Zyrtec, which is cetirizine.    generic Allegra , available generically as fexofenadine ; comes in 60 mg and 180 mg once daily strengths.    Generic Claritin :  also available as loratidine .    I am increasing your losartan to 100 mg   Please return in one week for a BP check,  And one month to see me

## 2016-03-17 DIAGNOSIS — T783XXA Angioneurotic edema, initial encounter: Secondary | ICD-10-CM | POA: Insufficient documentation

## 2016-03-17 LAB — CBC WITH DIFFERENTIAL/PLATELET
Basophils Absolute: 0 10*3/uL (ref 0.0–0.1)
Basophils Relative: 0.6 % (ref 0.0–3.0)
Eosinophils Absolute: 0.1 10*3/uL (ref 0.0–0.7)
Eosinophils Relative: 1.3 % (ref 0.0–5.0)
HCT: 44.6 % (ref 36.0–46.0)
Hemoglobin: 15.4 g/dL — ABNORMAL HIGH (ref 12.0–15.0)
Lymphocytes Relative: 35.1 % (ref 12.0–46.0)
Lymphs Abs: 2.9 10*3/uL (ref 0.7–4.0)
MCHC: 34.6 g/dL (ref 30.0–36.0)
MCV: 89.5 fl (ref 78.0–100.0)
Monocytes Absolute: 0.5 10*3/uL (ref 0.1–1.0)
Monocytes Relative: 5.8 % (ref 3.0–12.0)
Neutro Abs: 4.7 10*3/uL (ref 1.4–7.7)
Neutrophils Relative %: 57.2 % (ref 43.0–77.0)
Platelets: 271 10*3/uL (ref 150.0–400.0)
RBC: 4.98 Mil/uL (ref 3.87–5.11)
RDW: 12.4 % (ref 11.5–15.5)
WBC: 8.2 10*3/uL (ref 4.0–10.5)

## 2016-03-17 LAB — COMPREHENSIVE METABOLIC PANEL
ALT: 18 U/L (ref 0–35)
AST: 21 U/L (ref 0–37)
Albumin: 4.9 g/dL (ref 3.5–5.2)
Alkaline Phosphatase: 52 U/L (ref 39–117)
BUN: 13 mg/dL (ref 6–23)
CO2: 32 mEq/L (ref 19–32)
Calcium: 10.5 mg/dL (ref 8.4–10.5)
Chloride: 99 mEq/L (ref 96–112)
Creatinine, Ser: 0.6 mg/dL (ref 0.40–1.20)
GFR: 111.03 mL/min (ref >60.00)
Glucose, Bld: 101 mg/dL — ABNORMAL HIGH (ref 70–99)
Potassium: 4.2 mEq/L (ref 3.5–5.1)
Sodium: 138 mEq/L (ref 135–145)
Total Bilirubin: 0.6 mg/dL (ref 0.2–1.2)
Total Protein: 8.3 g/dL (ref 6.0–8.3)

## 2016-03-17 NOTE — Assessment & Plan Note (Signed)
Not well controlled.  Will increase losartan dose to 100 mg daily

## 2016-03-17 NOTE — Assessment & Plan Note (Addendum)
Presumed,  By accounts of recurrent left sided facial swelling brought on by eating certain proteins.  Advised trial of daily 2nd generation antihistamine, Singulair,  and H2 blocker.  Return in one month .  Will consider allergy testing and rheumatologic evaluation at follow up.

## 2016-03-17 NOTE — Assessment & Plan Note (Signed)
Etiology unclear, now s/p pacemaker implantation during hospital admission Jun 9th .

## 2016-03-19 ENCOUNTER — Encounter: Payer: Self-pay | Admitting: Internal Medicine

## 2016-03-22 ENCOUNTER — Telehealth: Payer: Self-pay | Admitting: Cardiology

## 2016-03-22 NOTE — Telephone Encounter (Signed)
Patient tells me she was at PCP last week and they increased her Losartan to 100 mg daily, secondary to BP 130/94. States that she had facial swelling when she woke up the next morning after taking increase dosage.  She stopped and went back to original dosing of 50 mg daily. Patient informed that Dr. Elberta Fortis does not normally follow BPs and to follow up w/ PCP about SE after increase dosage. Patient verbalized understanding and agreeable to plan.

## 2016-03-22 NOTE — Telephone Encounter (Signed)
New message      Talk to the nurse regarding her bp medication (Losartan).  Her PCP has changed the dosage and she want to talk to the nurse about it.

## 2016-03-24 ENCOUNTER — Ambulatory Visit (INDEPENDENT_AMBULATORY_CARE_PROVIDER_SITE_OTHER): Payer: BLUE CROSS/BLUE SHIELD

## 2016-03-24 VITALS — BP 134/80 | HR 110 | Resp 20

## 2016-03-24 DIAGNOSIS — I10 Essential (primary) hypertension: Secondary | ICD-10-CM | POA: Diagnosis not present

## 2016-03-24 NOTE — Progress Notes (Signed)
Patient came in for BP check, at last visit was told to increase her losartan to 100mg  and continue to coreg.  She took one dose on Saturday of the 100mg .  Sunday morning woke up with facial swelling, bilateral lower extremity swelling, felt heavy all over.  Patient called the pharmacy, they advised to to stop the 100mg  and go back to the 50mg  dose.  She has taken the 50mg  dose since and symptoms have resolved.  No home readings to compare.  Checked bilateral upper extremities.  See vitals for details.   Please advise.

## 2016-03-30 ENCOUNTER — Other Ambulatory Visit: Payer: Self-pay

## 2016-03-30 MED ORDER — HYDROCHLOROTHIAZIDE 25 MG PO TABS: ORAL_TABLET | ORAL | Status: DC

## 2016-03-30 NOTE — Progress Notes (Signed)
Reviewed instructions with patient, she will check her heart rate for the next few days and call me on Friday with each days reading so I can send to provider for review.  Thanks

## 2016-03-30 NOTE — Progress Notes (Signed)
  I have reviewed the above information and have the following comments.  Continue losartan 50 mg daily if swelling has subsided. We may need to  increase carvedilol dose to 6.25 mg twice daily if her  pulse is still  > 100..  Can she come in again ,  Or can she check her pulse at home? She has a pacemaker which will prevent her pulse from getting too slow .   Duncan Dull, MD

## 2016-03-31 ENCOUNTER — Ambulatory Visit: Payer: BLUE CROSS/BLUE SHIELD | Admitting: Internal Medicine

## 2016-04-02 NOTE — Progress Notes (Signed)
Spoke with the patient to follow up.  She has been checking her HR and it is ranging from 92-96 at rest, 96-100 while she is ambulating, and then 104-110 if she is emotional/upset.  Please advise.  Those are averages from the past week.

## 2016-04-04 NOTE — Progress Notes (Signed)
Please ask her to increase the carvedilol yo 6.25 mg tiwce daily using the 3 mg tablets that she has .  If she tolerates   This I will update the prescription with the higher dose and send to pharmacy

## 2016-04-05 NOTE — Progress Notes (Signed)
Spoke with patient, she will increase the coreg and then call back if she is tolerating to get a new prescription.  thanks

## 2016-04-13 ENCOUNTER — Ambulatory Visit (INDEPENDENT_AMBULATORY_CARE_PROVIDER_SITE_OTHER): Payer: BLUE CROSS/BLUE SHIELD | Admitting: Internal Medicine

## 2016-04-13 ENCOUNTER — Encounter: Payer: Self-pay | Admitting: Internal Medicine

## 2016-04-13 VITALS — BP 140/92 | HR 92 | Temp 98.1°F | Resp 16 | Wt 187.0 lb

## 2016-04-13 DIAGNOSIS — I10 Essential (primary) hypertension: Secondary | ICD-10-CM

## 2016-04-13 DIAGNOSIS — T783XXS Angioneurotic edema, sequela: Secondary | ICD-10-CM | POA: Diagnosis not present

## 2016-04-13 DIAGNOSIS — T783XXD Angioneurotic edema, subsequent encounter: Secondary | ICD-10-CM | POA: Diagnosis not present

## 2016-04-13 DIAGNOSIS — E0590 Thyrotoxicosis, unspecified without thyrotoxic crisis or storm: Secondary | ICD-10-CM

## 2016-04-13 MED ORDER — CARVEDILOL 6.25 MG PO TABS: 3.1250 mg | ORAL_TABLET | Freq: Two times a day (BID) | ORAL | 0 refills | Status: DC

## 2016-04-13 MED ORDER — LOSARTAN POTASSIUM 100 MG PO TABS: 50.0000 mg | ORAL_TABLET | Freq: Every day | ORAL | 0 refills | Status: DC

## 2016-04-13 NOTE — Patient Instructions (Addendum)
Your blood pressure is near goal but I want to get you off the losartan, so:  Starting  on Saturday,  stop the losartan and increase the carvedilol to  12.5 mg twice daily  Starting Saturday evening   Goal is 130/80 or less.  Give the increased dose of carvedilol one week before we adjust again   Referral for allergy testing is in process

## 2016-04-13 NOTE — Progress Notes (Signed)
Subjective:  Patient ID: Anne Rush, female    DOB: 07/30/1961  Age: 55 y.o. MRN: [id number]  CC: The primary encounter diagnosis was Angioedema, subsequent encounter. Diagnoses of Allergic angioedema, sequela, Essential hypertension, and Subclinical hyperthyroidism were also pertinent to this visit.  HPI Krisna Omar Willert presents for follow  Up hypertension, bradycardia s/p pacer, and recurrent angioedema.  She had an episode of facial swelling after increasing dose of losartan to 100 mg  so she reduced the dose to 50 mg  and has had no subsequents episodes. . She increased dose of coreg on Saturday ,  Home bp readings have normalized.  She has continued to  Restrict her diet to o salmon,  Hummous,  Cucumbers and lettuce.  She has not started taking singulair or pepcid.   Still not seleeping well due to pacemaker causing shoulder  discomfort    Outpatient Medications Prior to Visit  Medication Sig Dispense Refill  . hydrochlorothiazide (HYDRODIURIL) 25 MG tablet TAKE 1 TABLET (25 MG TOTAL) BY MOUTH DAILY. 30 tablet 5  . nortriptyline (PAMELOR) 10 MG capsule Take 20 mg by mouth at bedtime.     . carvedilol (COREG) 3.125 MG tablet Take 1 tablet (3.125 mg total) by mouth 2 (two) times daily with a meal. (Patient taking differently: Take 6.5 mg by mouth 2 (two) times daily with a meal. ) 60 tablet 6  . losartan (COZAAR) 100 MG tablet Take 1 tablet (100 mg total) by mouth daily. (Patient taking differently: Take 50 mg by mouth daily. ) 30 tablet 0  . aspirin EC 81 MG tablet Take 81 mg by mouth once. Reported on 03/16/2016    . diazepam (VALIUM) 5 MG tablet Take 0.5-1 tablets (2.5-5 mg total) by mouth 2 (two) times daily as needed for anxiety. (Patient not taking: Reported on 04/13/2016) 60 tablet 1  . famotidine (PEPCID) 20 MG tablet Take 1 tablet (20 mg total) by mouth 2 (two) times daily. (Patient not taking: Reported on 04/13/2016) 60 tablet 1  . montelukast (SINGULAIR) 10 MG tablet Take 1 tablet  (10 mg total) by mouth at bedtime. (Patient not taking: Reported on 04/13/2016) 30 tablet 1  . hydrochlorothiazide (HYDRODIURIL) 25 MG tablet TAKE 1 TABLET (25 MG TOTAL) BY MOUTH DAILY. 30 tablet 6   No facility-administered medications prior to visit.     Review of Systems;  Patient denies headache, fevers, malaise, unintentional weight loss, skin rash, eye pain, sinus congestion and sinus pain, sore throat, dysphagia,  hemoptysis , cough, dyspnea, wheezing, chest pain, palpitations, orthopnea, edema, abdominal pain, nausea, melena, diarrhea, constipation, flank pain, dysuria, hematuria, urinary  Frequency, nocturia, numbness, tingling, seizures,  Focal weakness, Loss of consciousness,  Tremor, insomnia, depression, anxiety, and suicidal ideation.      Objective:  BP (!) 140/92 (BP Location: Left Arm, Patient Position: Sitting, Cuff Size: Large) Comment: nervous  Pulse 92   Temp 98.1 F (36.7 C) (Oral)   Resp 16   Wt 187 lb (84.8 kg)   BMI 33.66 kg/m   BP Readings from Last 3 Encounters:  04/13/16 (!) 140/92  03/24/16 134/80  03/16/16 (!) 130/94    Wt Readings from Last 3 Encounters:  04/13/16 187 lb (84.8 kg)  03/16/16 187 lb (84.8 kg)  02/26/16 192 lb (87.1 kg)    General appearance: alert, cooperative and appears stated age Ears: normal TM's and external ear canals both ears Throat: lips, mucosa, and tongue normal; teeth and gums normal Neck: no adenopathy, no  carotid bruit, supple, symmetrical, trachea midline and thyroid not enlarged, symmetric, no tenderness/mass/nodules Back: symmetric, no curvature. ROM normal. No CVA tenderness. Lungs: clear to auscultation bilaterally Heart: regular rate and rhythm, S1, S2 normal, no murmur, click, rub or gallop Abdomen: soft, non-tender; bowel sounds normal; no masses,  no organomegaly Pulses: 2+ and symmetric Skin: Skin color, texture, turgor normal. No rashes or lesions Lymph nodes: Cervical, supraclavicular, and axillary  nodes normal.  Lab Results  Component Value Date   HGBA1C 5.6 04/24/2014    Lab Results  Component Value Date   CREATININE 0.60 03/16/2016   CREATININE 0.68 02/26/2016   CREATININE 0.75 05/22/2015    Lab Results  Component Value Date   WBC 8.2 03/16/2016   HGB 15.4 (H) 03/16/2016   HCT 44.6 03/16/2016   PLT 271.0 03/16/2016   GLUCOSE 101 (H) 03/16/2016   CHOL 237 (H) 05/12/2015   TRIG 195.0 (H) 05/12/2015   HDL 49.60 05/12/2015   LDLDIRECT 200.0 04/24/2014   LDLCALC 149 (H) 05/12/2015   ALT 18 03/16/2016   AST 21 03/16/2016   NA 138 03/16/2016   K 4.2 03/16/2016   CL 99 03/16/2016   CREATININE 0.60 03/16/2016   BUN 13 03/16/2016   CO2 32 03/16/2016   TSH 1.56 05/12/2015   INR 1.0 10/12/2010   HGBA1C 5.6 04/24/2014    Dg Chest 2 View  Result Date: 02/27/2016 CLINICAL DATA:  Post cardiac pacemaker placement. EXAM: CHEST  2 VIEW COMPARISON:  02/26/2016 FINDINGS: There has been interval placement of dual lead cardiac pacemaker with leads overlying the expected location of right atrium and right ventricle. Cardiomediastinal silhouette is normal. Mediastinal contours appear intact. There is no evidence of focal airspace consolidation, pleural effusion or pneumothorax. Low lung volumes. Osseous structures are without acute abnormality. Soft tissues are grossly normal. IMPRESSION: Low lung volumes. No evidence of pneumothorax status post cardiac pacemaker placement. Electronically Signed   By: Ted Mcalpine M.D.   On: 02/27/2016 07:40   Dg Chest 2 View  Result Date: 02/26/2016 CLINICAL DATA:  Chest pain for 6 days and bradycardia. Shortness of breath on exertion EXAM: CHEST  2 VIEW COMPARISON:  Chest radiograph August 27, 2010 and chest CT October 08, 2010 FINDINGS: There is generalized interstitial prominence, stable. There is no frank edema or consolidation. There is mild scarring in the left base. Heart size and pulmonary vascularity are normal. No adenopathy. No bone  lesions. IMPRESSION: Generalized interstitial prominence, likely due to underlying fibrotic type change. No edema or consolidation. Mild scarring left base. No new opacity. Stable cardiac silhouette. Electronically Signed   By: Bretta Bang III M.D.   On: 02/26/2016 14:51    Assessment & Plan:   Problem List Items Addressed This Visit    Essential hypertension    She had an episode of facila swelling while taking 100 mg losartan, and none since reducing the dose to 50 mg.  Will stop losartan and increase carvedilol as needed.  Continue hctZ.        Relevant Medications   carvedilol (COREG) 6.25 MG tablet   RESOLVED: Subclinical hyperthyroidism      . Lab Results  Component Value Date   TSH 1.56 05/12/2015         Relevant Medications   carvedilol (COREG) 6.25 MG tablet   Allergic angioedema    Recurrent episodes , Etiology unclear.  She has not had allergy testing but appears to have many food allergies, referral to Allergist in process. Plan  is to stop the losartan and use carvedilol, , HCTZ and amlodipine if needed.        Other Visit Diagnoses    Angioedema, subsequent encounter    -  Primary   Relevant Orders   Ambulatory referral to Allergy      I have discontinued Ms. Demarinis's losartan and losartan. I have also changed her carvedilol. Additionally, I am having her maintain her diazepam, nortriptyline, hydrochlorothiazide, aspirin EC, montelukast, and famotidine.  Meds ordered this encounter  Medications  . DISCONTD: losartan (COZAAR) 100 MG tablet    Sig: Take 0.5 tablets (50 mg total) by mouth daily.    Dispense:  30 tablet    Refill:  0    Stopped Lisinopril  . carvedilol (COREG) 6.25 MG tablet    Sig: Take 0.5 tablets (3.125 mg total) by mouth 2 (two) times daily with a meal.    Dispense:  180 tablet    Refill:  0    Medications Discontinued During This Encounter  Medication Reason  . hydrochlorothiazide (HYDRODIURIL) 25 MG tablet Duplicate  .  losartan (COZAAR) 100 MG tablet Reorder  . carvedilol (COREG) 3.125 MG tablet Reorder  . losartan (COZAAR) 100 MG tablet     Follow-up: Return in about 2 weeks (around 04/27/2016), or RN viist for BP check , for 6 months with me .   Sherlene Shams, MD

## 2016-04-15 NOTE — Assessment & Plan Note (Signed)
 .   Lab Results  Component Value Date   TSH 1.56 05/12/2015

## 2016-04-15 NOTE — Assessment & Plan Note (Signed)
She had an episode of facila swelling while taking 100 mg losartan, and none since reducing the dose to 50 mg.  Will stop losartan and increase carvedilol as needed.  Continue hctZ.

## 2016-04-15 NOTE — Assessment & Plan Note (Signed)
Recurrent episodes , Etiology unclear.  She has not had allergy testing but appears to have many food allergies, referral to Allergist in process. Plan is to stop the losartan and use carvedilol, , HCTZ and amlodipine if needed.

## 2016-04-20 ENCOUNTER — Telehealth: Payer: Self-pay | Admitting: *Deleted

## 2016-04-20 NOTE — Telephone Encounter (Signed)
This was sent on the 25th, thanks

## 2016-04-20 NOTE — Telephone Encounter (Signed)
Patient has requested to have Carvedilol faxed over to [facility] for a 90 day supply . This will need be a new Rx, to update the old Rx.  Pt contact [phone]

## 2016-04-22 ENCOUNTER — Encounter: Payer: Self-pay | Admitting: Internal Medicine

## 2016-04-22 NOTE — Telephone Encounter (Signed)
Okay to send in 12.5mg  BID?

## 2016-04-23 MED ORDER — CARVEDILOL 12.5 MG PO TABS: 12.5000 mg | ORAL_TABLET | Freq: Two times a day (BID) | ORAL | 2 refills | Status: DC

## 2016-05-11 ENCOUNTER — Ambulatory Visit (INDEPENDENT_AMBULATORY_CARE_PROVIDER_SITE_OTHER): Payer: BLUE CROSS/BLUE SHIELD | Admitting: Surgical

## 2016-05-11 DIAGNOSIS — I10 Essential (primary) hypertension: Secondary | ICD-10-CM | POA: Diagnosis not present

## 2016-05-11 NOTE — Progress Notes (Signed)
Patient came in today for blood pressure check that was ordered by Dr Darrick Huntsman on 04/13/16 for a nurse visit. Today patients blood pressure in the right arm is 142/88 and left arm is 140/92. Patients pulse today is 97. Patient stated that she is taking the medication as directed by Dr.Tullo.

## 2016-05-12 ENCOUNTER — Ambulatory Visit (INDEPENDENT_AMBULATORY_CARE_PROVIDER_SITE_OTHER): Payer: BLUE CROSS/BLUE SHIELD | Admitting: Family

## 2016-05-12 ENCOUNTER — Encounter: Payer: Self-pay | Admitting: Family

## 2016-05-12 VITALS — BP 130/92 | HR 87 | Temp 98.2°F | Wt 188.6 lb

## 2016-05-12 DIAGNOSIS — R05 Cough: Secondary | ICD-10-CM | POA: Diagnosis not present

## 2016-05-12 DIAGNOSIS — R059 Cough, unspecified: Secondary | ICD-10-CM

## 2016-05-12 MED ORDER — AMOXICILLIN-POT CLAVULANATE 875-125 MG PO TABS: 1.0000 | ORAL_TABLET | Freq: Two times a day (BID) | ORAL | 0 refills | Status: DC

## 2016-05-12 MED ORDER — ALBUTEROL SULFATE HFA 108 (90 BASE) MCG/ACT IN AERS: 2.0000 | INHALATION_SPRAY | Freq: Four times a day (QID) | RESPIRATORY_TRACT | 1 refills | Status: DC | PRN

## 2016-05-12 NOTE — Patient Instructions (Signed)

## 2016-05-12 NOTE — Progress Notes (Signed)
Subjective:    Patient ID: Anne PatesLisa J Rush, female    DOB: [birth year], 55 y.o.   MRN: [id number]  CC: Anne Rush is a [age] y.o. female who presents today for an acute visit.    HPI: Patient here for acute visit evaluation of nonproductive cough 1 week, worsening. Concerned for h/o bronchitis. Endorses wheezing, nasal drainage . No SOB, fever, sore throat, ear pain. Tyelonol with some relief.  H/o interstitial lung disease, has been seen by pulmonologist in the past. Augmentin works best.     HISTORY:  Past Medical History:  Diagnosis Date  . Anxiety   . Hyperlipidemia   . Hypertension   . Interstitial lung disease (HCC)   . Pollen allergies   . Squamous cell carcinoma (HCC) 2014   skin cancer  . Subclinical hyperthyroidism 06/17/2014   Past Surgical History:  Procedure Laterality Date  . COSMETIC SURGERY    . EP IMPLANTABLE DEVICE N/A 02/26/2016   Procedure: Pacemaker Implant;  Surgeon: Will Jorja LoaMartin Camnitz, MD;  Location: [facility];  Service: Cardiovascular;  Laterality: N/A;  . TUBAL LIGATION     Family History  Problem Relation Age of Onset  . Anxiety disorder Mother   . Hypertension Mother   . Cancer Maternal Aunt     breast  . Heart failure Maternal Grandfather     Allergies: Codeine; Oxycodone-acetaminophen; Paroxetine; Sulfonamide derivatives; and Lisinopril Current Outpatient Prescriptions on File Prior to Visit  Medication Sig Dispense Refill  . aspirin EC 81 MG tablet Take 81 mg by mouth once. Reported on 03/16/2016    . carvedilol (COREG) 12.5 MG tablet Take 1 tablet (12.5 mg total) by mouth 2 (two) times daily with a meal. 60 tablet 2  . diazepam (VALIUM) 5 MG tablet Take 0.5-1 tablets (2.5-5 mg total) by mouth 2 (two) times daily as needed for anxiety. 60 tablet 1  . famotidine (PEPCID) 20 MG tablet Take 1 tablet (20 mg total) by mouth 2 (two) times daily. 60 tablet 1  . hydrochlorothiazide (HYDRODIURIL) 25 MG tablet TAKE 1 TABLET (25 MG TOTAL) BY  MOUTH DAILY. 30 tablet 5  . montelukast (SINGULAIR) 10 MG tablet Take 1 tablet (10 mg total) by mouth at bedtime. 30 tablet 1  . nortriptyline (PAMELOR) 10 MG capsule Take 20 mg by mouth at bedtime.      No current facility-administered medications on file prior to visit.     Social History  Substance Use Topics  . Smoking status: Former Games developermoker  . Smokeless tobacco: Never Used  . Alcohol use No    Review of Systems  Constitutional: Negative for chills and fever.  HENT: Negative for congestion, ear pain, sinus pressure and sore throat.   Respiratory: Positive for cough and wheezing. Negative for shortness of breath.   Cardiovascular: Negative for chest pain and palpitations.  Gastrointestinal: Negative for nausea and vomiting.      Objective:    BP (!) 130/92   Pulse 87   Temp 98.2 F (36.8 C) (Oral)   Wt 188 lb 9.6 oz (85.5 kg)   SpO2 94%   BMI 33.95 kg/m    Physical Exam  Constitutional: She appears well-developed and well-nourished.  HENT:  Head: Normocephalic and atraumatic.  Right Ear: Hearing, tympanic membrane, external ear and ear canal normal. No drainage, swelling or tenderness. No foreign bodies. Tympanic membrane is not erythematous and not bulging. No middle ear effusion. No decreased hearing is noted.  Left Ear: Hearing, tympanic membrane,  external ear and ear canal normal. No drainage, swelling or tenderness. No foreign bodies. Tympanic membrane is not erythematous and not bulging.  No middle ear effusion. No decreased hearing is noted.  Nose: Nose normal. No rhinorrhea. Right sinus exhibits no maxillary sinus tenderness and no frontal sinus tenderness. Left sinus exhibits no maxillary sinus tenderness and no frontal sinus tenderness.  Mouth/Throat: Uvula is midline, oropharynx is clear and moist and mucous membranes are normal. No oropharyngeal exudate, posterior oropharyngeal edema, posterior oropharyngeal erythema or tonsillar abscesses.  Eyes: Conjunctivae  are normal.  Cardiovascular: Regular rhythm, normal heart sounds and normal pulses.   Pulmonary/Chest: Effort normal and breath sounds normal. She has no wheezes. She has no rhonchi. She has no rales.  Lymphadenopathy:       Head (right side): No submental, no submandibular, no tonsillar, no preauricular, no posterior auricular and no occipital adenopathy present.       Head (left side): No submental, no submandibular, no tonsillar, no preauricular, no posterior auricular and no occipital adenopathy present.    She has no cervical adenopathy.  Neurological: She is alert.  Skin: Skin is warm and dry.  Psychiatric: She has a normal mood and affect. Her speech is normal and behavior is normal. Thought content normal.  Vitals reviewed.      Assessment & Plan:   1. Cough Based on duration of symptoms and patient's history of interstitial lung disease, appropriate to start antibiotic for bacterial URI.  - amoxicillin-clavulanate (AUGMENTIN) 875-125 MG tablet; Take 1 tablet by mouth 2 (two) times daily.  Dispense: 14 tablet; Refill: 0 - albuterol (PROVENTIL HFA) 108 (90 Base) MCG/ACT inhaler; Inhale 2 puffs into the lungs every 6 (six) hours as needed for wheezing or shortness of breath.  Dispense: 1 Inhaler; Refill: 1    I am having Anne Rush start on amoxicillin-clavulanate and albuterol. I am also having her maintain her diazepam, nortriptyline, hydrochlorothiazide, aspirin EC, montelukast, famotidine, and carvedilol.   Meds ordered this encounter  Medications  . amoxicillin-clavulanate (AUGMENTIN) 875-125 MG tablet    Sig: Take 1 tablet by mouth 2 (two) times daily.    Dispense:  14 tablet    Refill:  0    Order Specific Question:   Supervising Provider    Answer:   Duncan Dull L [[id number]]  . albuterol (PROVENTIL HFA) 108 (90 Base) MCG/ACT inhaler    Sig: Inhale 2 puffs into the lungs every 6 (six) hours as needed for wheezing or shortness of breath.    Dispense:  1 Inhaler     Refill:  1    Order Specific Question:   Supervising Provider    Answer:   Sherlene Shams [[id number]]    Return precautions given.   Risks, benefits, and alternatives of the medications and treatment plan prescribed today were discussed, and patient expressed understanding.   Education regarding symptom management and diagnosis given to patient on AVS.  Continue to follow with TULLO, Mar Daring, MD for routine health maintenance.   Anne Rush and I agreed with plan.   Rennie Plowman, FNP

## 2016-05-12 NOTE — Progress Notes (Signed)
Pre visit review using our clinic review tool, if applicable. No additional management support is needed unless otherwise documented below in the visit note. 

## 2016-05-14 NOTE — Progress Notes (Signed)
  I have reviewed the above information and agree with above.   No changes at this time   Duncan Dull, MD

## 2016-05-17 ENCOUNTER — Encounter: Payer: Self-pay | Admitting: Cardiology

## 2016-05-27 ENCOUNTER — Encounter: Payer: Self-pay | Admitting: Cardiology

## 2016-05-31 NOTE — Progress Notes (Signed)
Electrophysiology Office Note   Date:  06/01/2016   ID:  Anne PatesLisa J Bossman, DOB [birth year], MRN [id number]  PCP:  Sherlene ShamsULLO, TERESA L, MD  Primary Electrophysiologist:  Regan LemmingWill Martin Tessa Seaberry, MD    Chief Complaint  Patient presents with  . Pacemaker Check    Complete Heart block     History of Present Illness: Anne Rush is a 55 y.o. female who presents today for electrophysiology evaluation.   Presented to the hospital with complete AV block and had a St. Jude pacemaker placed.   Today, she denies symptoms of palpitations, chest pain, shortness of breath, orthopnea, PND, lower extremity edema, claudication, dizziness, presyncope, syncope, bleeding, or neurologic sequela. The patient is tolerating medications without difficulties and is otherwise without complaint today. She is feeling much better since her pacemaker was inserted, with quite a bit more energy and less fatigued. She still has a pinching sensation over her wound, but that is very rare. She says that she would like to start exercising.   Past Medical History:  Diagnosis Date  . Anxiety   . Hyperlipidemia   . Hypertension   . Interstitial lung disease (HCC)   . Pollen allergies   . Squamous cell carcinoma (HCC) 2014   skin cancer  . Subclinical hyperthyroidism 06/17/2014   Past Surgical History:  Procedure Laterality Date  . COSMETIC SURGERY    . EP IMPLANTABLE DEVICE N/A 02/26/2016   Procedure: Pacemaker Implant;  Surgeon: Arlyne Brandes Jorja LoaMartin Delroy Ordway, MD;  Location: [facility];  Service: Cardiovascular;  Laterality: N/A;  . TUBAL LIGATION       Current Outpatient Prescriptions  Medication Sig Dispense Refill  . aspirin EC 81 MG tablet Take 81 mg by mouth once. Reported on 03/16/2016    . carvedilol (COREG) 12.5 MG tablet Take 1 tablet (12.5 mg total) by mouth 2 (two) times daily with a meal. 60 tablet 2  . diazepam (VALIUM) 5 MG tablet Take 0.5-1 tablets (2.5-5 mg total) by mouth 2 (two) times daily as needed for  anxiety. 60 tablet 1  . hydrochlorothiazide (HYDRODIURIL) 25 MG tablet TAKE 1 TABLET (25 MG TOTAL) BY MOUTH DAILY. 30 tablet 5  . nortriptyline (PAMELOR) 10 MG capsule Take 20 mg by mouth at bedtime.      No current facility-administered medications for this visit.     Allergies:   Codeine; Oxycodone-acetaminophen; Paroxetine; Sulfonamide derivatives; and Lisinopril   Social History:  The patient  reports that she has quit smoking. She has never used smokeless tobacco. She reports that she does not drink alcohol or use drugs.   Family History:  The patient's family history includes Anxiety disorder in her mother; Cancer in her maternal aunt; Heart failure in her maternal grandfather; Hypertension in her mother.    ROS:  Please see the history of present illness.   Otherwise, review of systems is positive for none.   All other systems are reviewed and negative.    PHYSICAL EXAM: VS:  BP (!) 138/102   Pulse 82   Ht 5' 3.5" (1.613 m)   Wt 190 lb 6.4 oz (86.4 kg)   BMI 33.20 kg/m  , BMI Body mass index is 33.2 kg/m. GEN: Well nourished, well developed, in no acute distress  HEENT: normal  Neck: no JVD, carotid bruits, or masses Cardiac: RRR; no murmurs, rubs, or gallops,no edema  Respiratory:  clear to auscultation bilaterally, normal work of breathing GI: soft, nontender, nondistended, + BS MS: no deformity or atrophy  Skin: warm and dry,  device pocket is well healed Neuro:  Strength and sensation are intact Psych: euthymic mood, full affect  EKG:  EKG is ordered today. Personal review of the ekg ordered shows sinus rhythm, rate 83, V paced   Device interrogation is reviewed today in detail.  See PaceArt for details.   Recent Labs: 03/16/2016: ALT 18; BUN 13; Creatinine, Ser 0.60; Hemoglobin 15.4; Platelets 271.0; Potassium 4.2; Sodium 138    Lipid Panel     Component Value Date/Time   CHOL 237 (H) 05/12/2015 0901   TRIG 195.0 (H) 05/12/2015 0901   HDL 49.60 05/12/2015  0901   CHOLHDL 5 05/12/2015 0901   VLDL 39.0 05/12/2015 0901   LDLCALC 149 (H) 05/12/2015 0901   LDLDIRECT 200.0 04/24/2014 1538     Wt Readings from Last 3 Encounters:  06/01/16 190 lb 6.4 oz (86.4 kg)  05/12/16 188 lb 9.6 oz (85.5 kg)  04/13/16 187 lb (84.8 kg)      Other studies Reviewed: Additional studies/ records that were reviewed today include: TTE 02/27/16  Review of the above records today demonstrates:  - Left ventricle: The cavity size was normal. Systolic function was   moderately reduced. The estimated ejection fraction was in the   range of 35% to 40%. Dyskinesis of the mid-apicalanteroseptal and   apical myocardium.   ASSESSMENT AND PLAN:  1.  Complete AV block: Status post St. Jude dual-chamber pacemaker. Feeling much better after implantation. Changes were made for long-term battery life.   2. Hypertension:  diastolic blood pressure elevated today. We'll check an echocardiogram, as her EF was decreased at the last visit in the hospital. We'll likely add another agent.   Current medicines are reviewed at length with the patient today.   The patient does not have concerns regarding her medicines.  The following changes were made today:  none  Labs/ tests ordered today include:  Orders Placed This Encounter  Procedures  . EKG 12-Lead  . ECHOCARDIOGRAM COMPLETE     Disposition:   FU with Atara Paterson 9 months  Signed, Lucillia Corson Jorja Loa, MD  06/01/2016 10:12 AM     [facility] [address] [place] [address] [phone] (office) [phone] (fax)

## 2016-06-01 ENCOUNTER — Ambulatory Visit (INDEPENDENT_AMBULATORY_CARE_PROVIDER_SITE_OTHER): Payer: BLUE CROSS/BLUE SHIELD | Admitting: Cardiology

## 2016-06-01 ENCOUNTER — Encounter: Payer: Self-pay | Admitting: Cardiology

## 2016-06-01 VITALS — BP 138/102 | HR 82 | Ht 63.5 in | Wt 190.4 lb

## 2016-06-01 DIAGNOSIS — R9430 Abnormal result of cardiovascular function study, unspecified: Secondary | ICD-10-CM

## 2016-06-01 DIAGNOSIS — I442 Atrioventricular block, complete: Secondary | ICD-10-CM | POA: Diagnosis not present

## 2016-06-01 NOTE — Patient Instructions (Signed)
Medication Instructions:  Your physician recommends that you continue on your current medications as directed. Please refer to the Current Medication list given to you today.  Labwork: None ordered  Testing/Procedures: Your physician has requested that you have an echocardiogram. Echocardiography is a painless test that uses sound waves to create images of your heart. It provides your doctor with information about the size and shape of your heart and how well your heart's chambers and valves are working. This procedure takes approximately one hour. There are no restrictions for this procedure.  Follow-Up: Your physician wants you to follow-up in: 9 months with Dr. Elberta Fortis.  You will receive a reminder letter in the mail two months in advance. If you don't receive a letter, please call our office to schedule the follow-up appointment.  If you need a refill on your cardiac medications before your next appointment, please call your pharmacy.  Thank you for choosing [facility]!!   Dory Horn, RN [phone]   Echocardiogram An echocardiogram, or echocardiography, uses sound waves (ultrasound) to produce an image of your heart. The echocardiogram is simple, painless, obtained within a short period of time, and offers valuable information to your health care provider. The images from an echocardiogram can provide information such as:  Evidence of coronary artery disease (CAD).  Heart size.  Heart muscle function.  Heart valve function.  Aneurysm detection.  Evidence of a past heart attack.  Fluid buildup around the heart.  Heart muscle thickening.  Assess heart valve function. LET [facility] CARE PROVIDER KNOW ABOUT:  Any allergies you have.  All medicines you are taking, including vitamins, herbs, eye drops, creams, and over-the-counter medicines.  Previous problems you or members of your family have had with the use of anesthetics.  Any blood disorders you  have.  Previous surgeries you have had.  Medical conditions you have.  Possibility of pregnancy, if this applies. BEFORE THE PROCEDURE  No special preparation is needed. Eat and drink normally.  PROCEDURE   In order to produce an image of your heart, gel will be applied to your chest and a wand-like tool (transducer) will be moved over your chest. The gel will help transmit the sound waves from the transducer. The sound waves will harmlessly bounce off your heart to allow the heart images to be captured in real-time motion. These images will then be recorded.  You may need an IV to receive a medicine that improves the quality of the pictures. AFTER THE PROCEDURE You may return to your normal schedule including diet, activities, and medicines, unless your health care provider tells you otherwise.   This information is not intended to replace advice given to you by your health care provider. Make sure you discuss any questions you have with your health care provider.   Document Released: 09/03/2000 Document Revised: 09/27/2014 Document Reviewed: 05/14/2013 Elsevier Interactive Patient Education [employer]

## 2016-06-14 ENCOUNTER — Ambulatory Visit (HOSPITAL_COMMUNITY): Payer: BLUE CROSS/BLUE SHIELD | Attending: Cardiovascular Disease

## 2016-06-14 ENCOUNTER — Other Ambulatory Visit: Payer: Self-pay

## 2016-06-14 DIAGNOSIS — Z950 Presence of cardiac pacemaker: Secondary | ICD-10-CM | POA: Insufficient documentation

## 2016-06-14 DIAGNOSIS — I10 Essential (primary) hypertension: Secondary | ICD-10-CM | POA: Diagnosis not present

## 2016-06-14 DIAGNOSIS — R9430 Abnormal result of cardiovascular function study, unspecified: Secondary | ICD-10-CM

## 2016-06-17 ENCOUNTER — Telehealth: Payer: Self-pay | Admitting: Cardiology

## 2016-06-17 NOTE — Telephone Encounter (Signed)
Advised that I will call her next week to inform her of echo findings. She is agreeable to plan. Forwarding to Dr. Elberta Fortis to review

## 2016-06-17 NOTE — Telephone Encounter (Signed)
New message ° ° ° ° ° °Calling to get echo results °

## 2016-07-08 ENCOUNTER — Other Ambulatory Visit: Payer: Self-pay | Admitting: Internal Medicine

## 2016-07-08 DIAGNOSIS — L509 Urticaria, unspecified: Secondary | ICD-10-CM | POA: Diagnosis not present

## 2016-07-08 DIAGNOSIS — R05 Cough: Secondary | ICD-10-CM | POA: Diagnosis not present

## 2016-07-08 DIAGNOSIS — H1045 Other chronic allergic conjunctivitis: Secondary | ICD-10-CM | POA: Diagnosis not present

## 2016-07-08 DIAGNOSIS — J309 Allergic rhinitis, unspecified: Secondary | ICD-10-CM | POA: Diagnosis not present

## 2016-07-19 DIAGNOSIS — Z23 Encounter for immunization: Secondary | ICD-10-CM | POA: Diagnosis not present

## 2016-08-16 DIAGNOSIS — D1801 Hemangioma of skin and subcutaneous tissue: Secondary | ICD-10-CM | POA: Diagnosis not present

## 2016-08-17 IMAGING — DX DG CHEST 2V
2 series · 2 of 2 positions shown · non-contrast
Comparison: Chest radiograph August 27, 2010 and chest CT October 08, 2010

CLINICAL DATA: Chest pain for 6 days and bradycardia. Shortness of
breath on exertion

EXAM:
CHEST  2 VIEW

[chest pa]
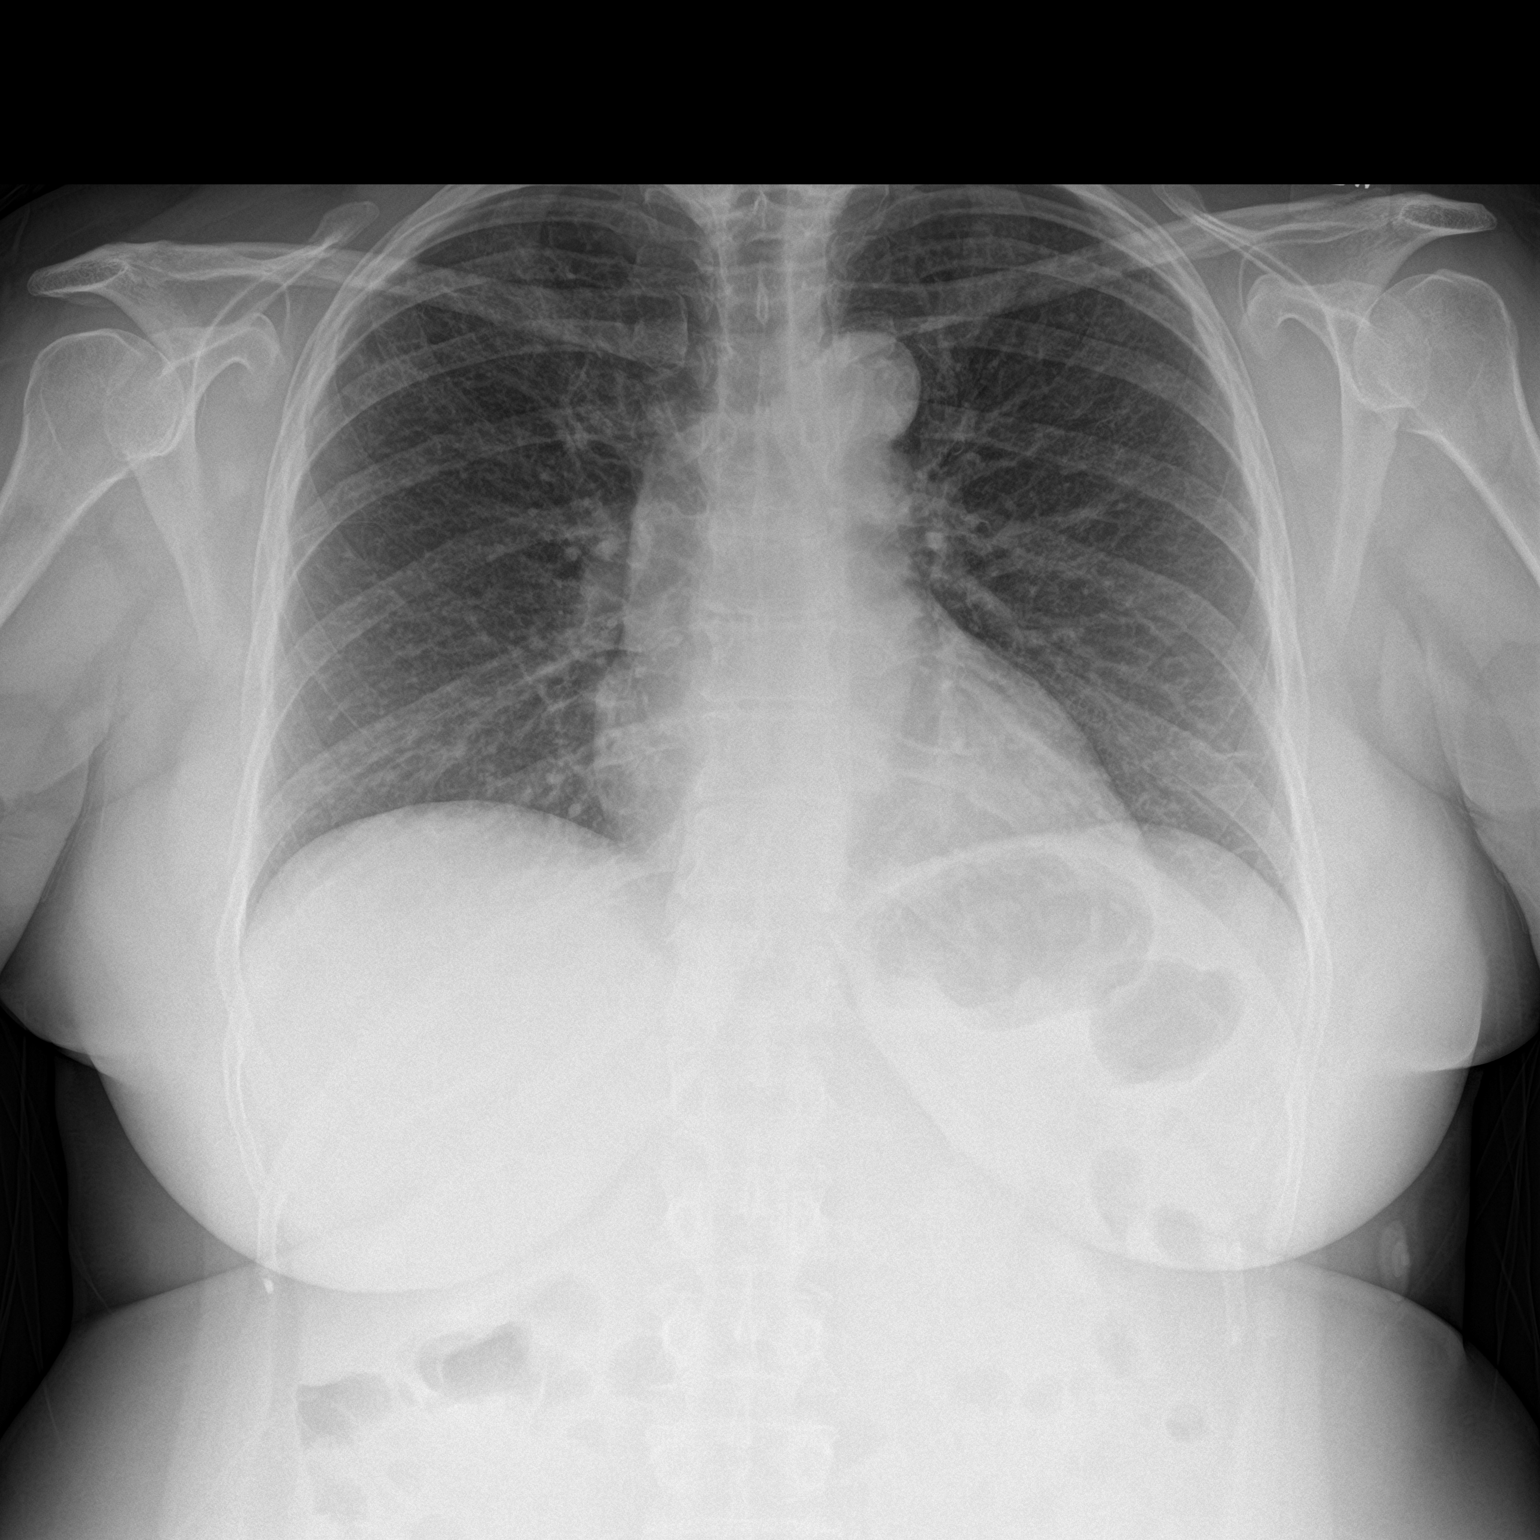

[chest lat]
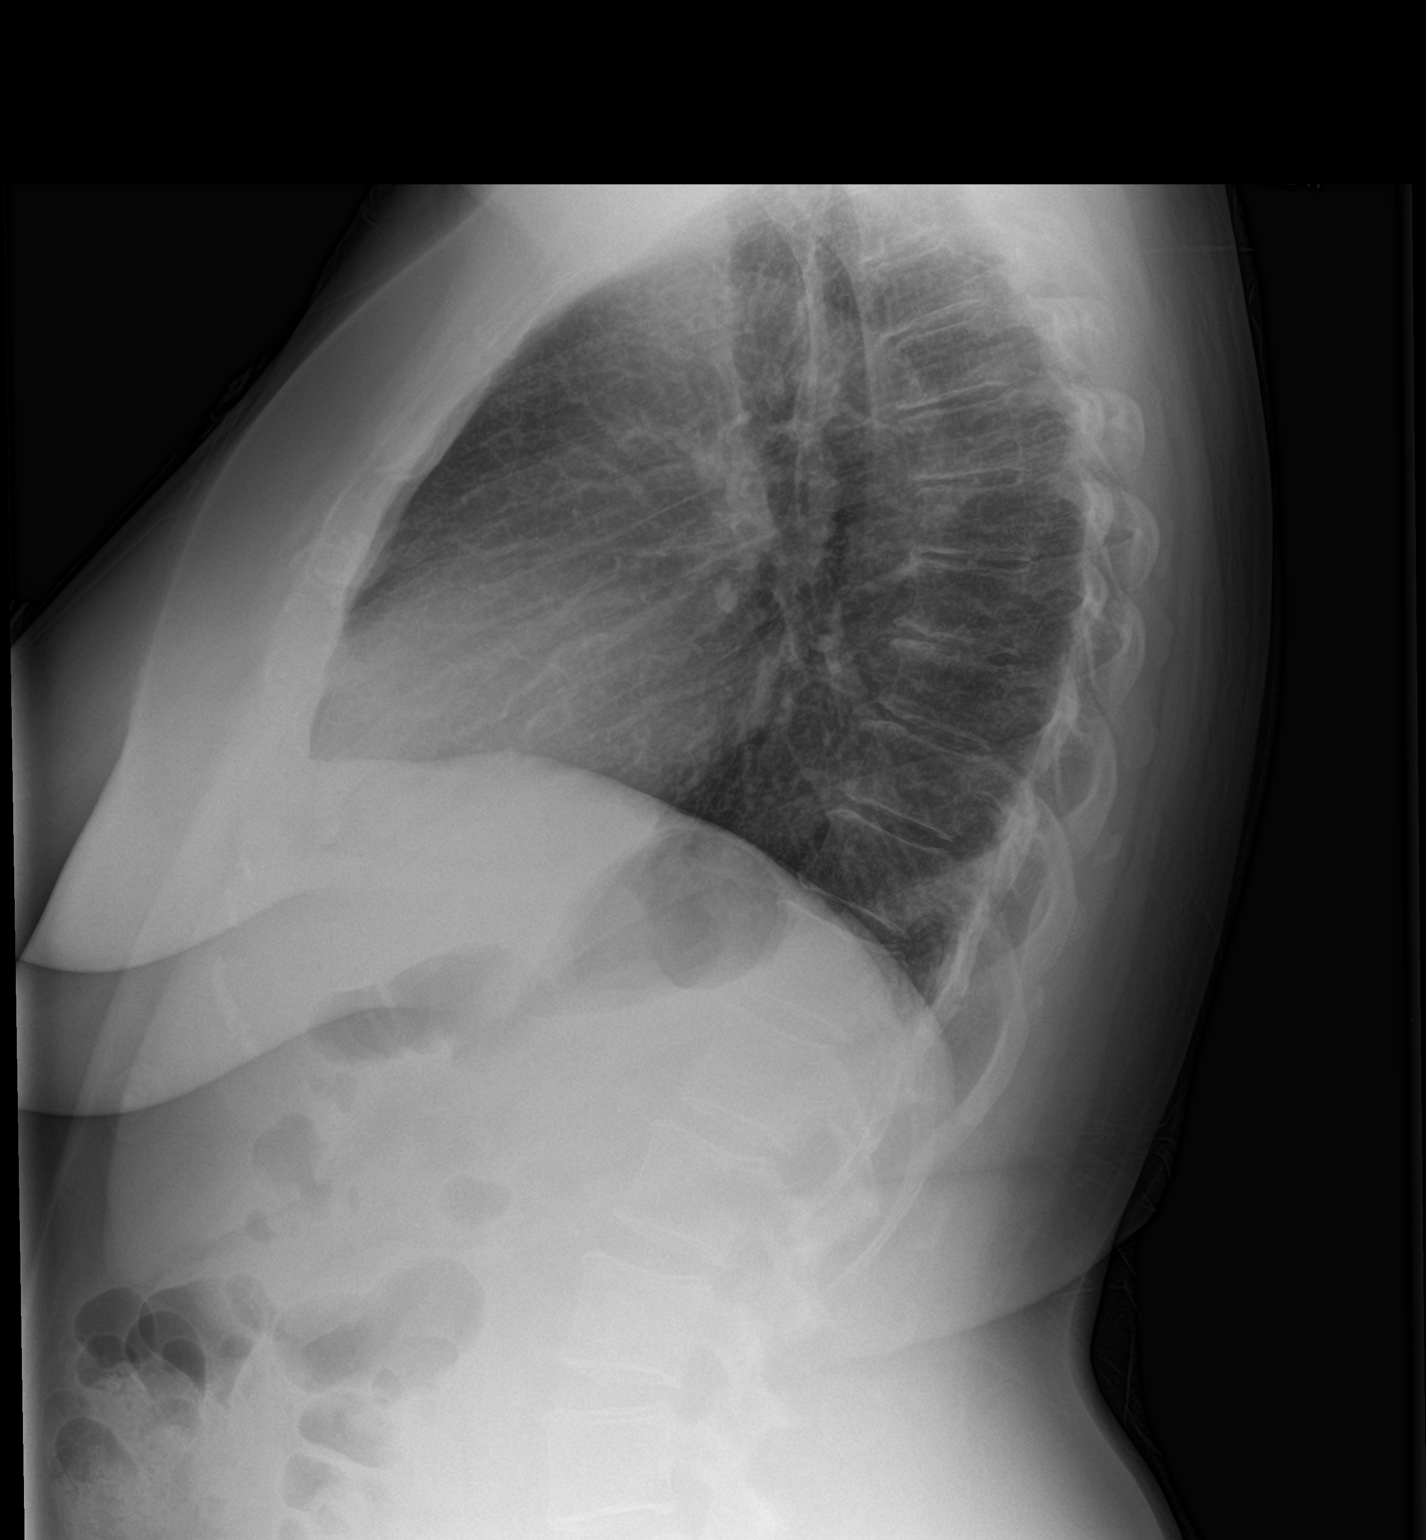

[2 of 2 positions shown; findings below may reference images not displayed]

FINDINGS: There is generalized interstitial prominence, stable. There is no
frank edema or consolidation. There is mild scarring in the left
base. Heart size and pulmonary vascularity are normal. No
adenopathy. No bone lesions.
IMPRESSION: Generalized interstitial prominence, likely due to underlying
fibrotic type change. No edema or consolidation. Mild scarring left
base. No new opacity. Stable cardiac silhouette.

## 2016-08-18 IMAGING — CR DG CHEST 2V
2 series · 2 of 2 positions shown · non-contrast
Comparison: 02/26/2016

CLINICAL DATA: Post cardiac pacemaker placement.

EXAM:
CHEST  2 VIEW

[chest pa]
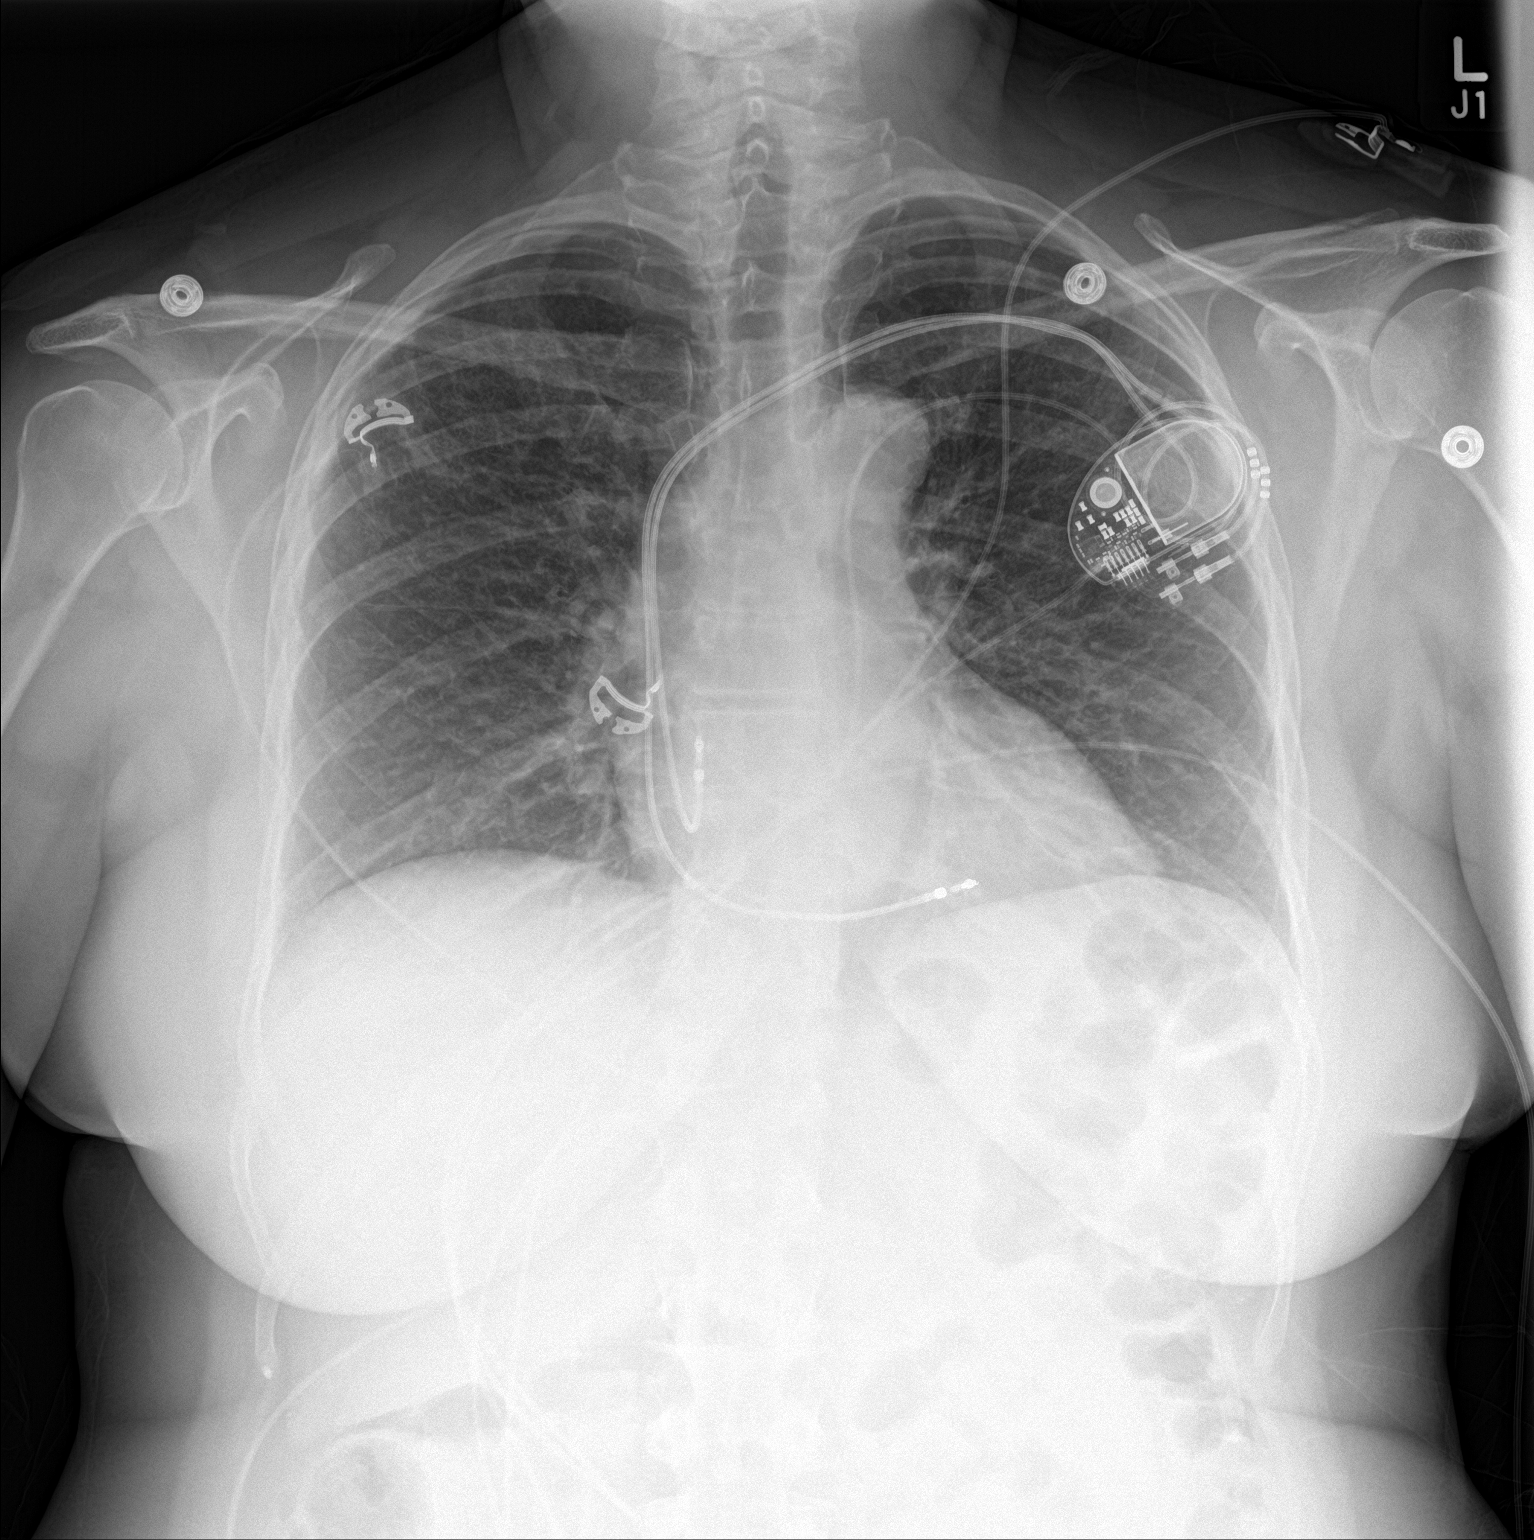

[chest lat]
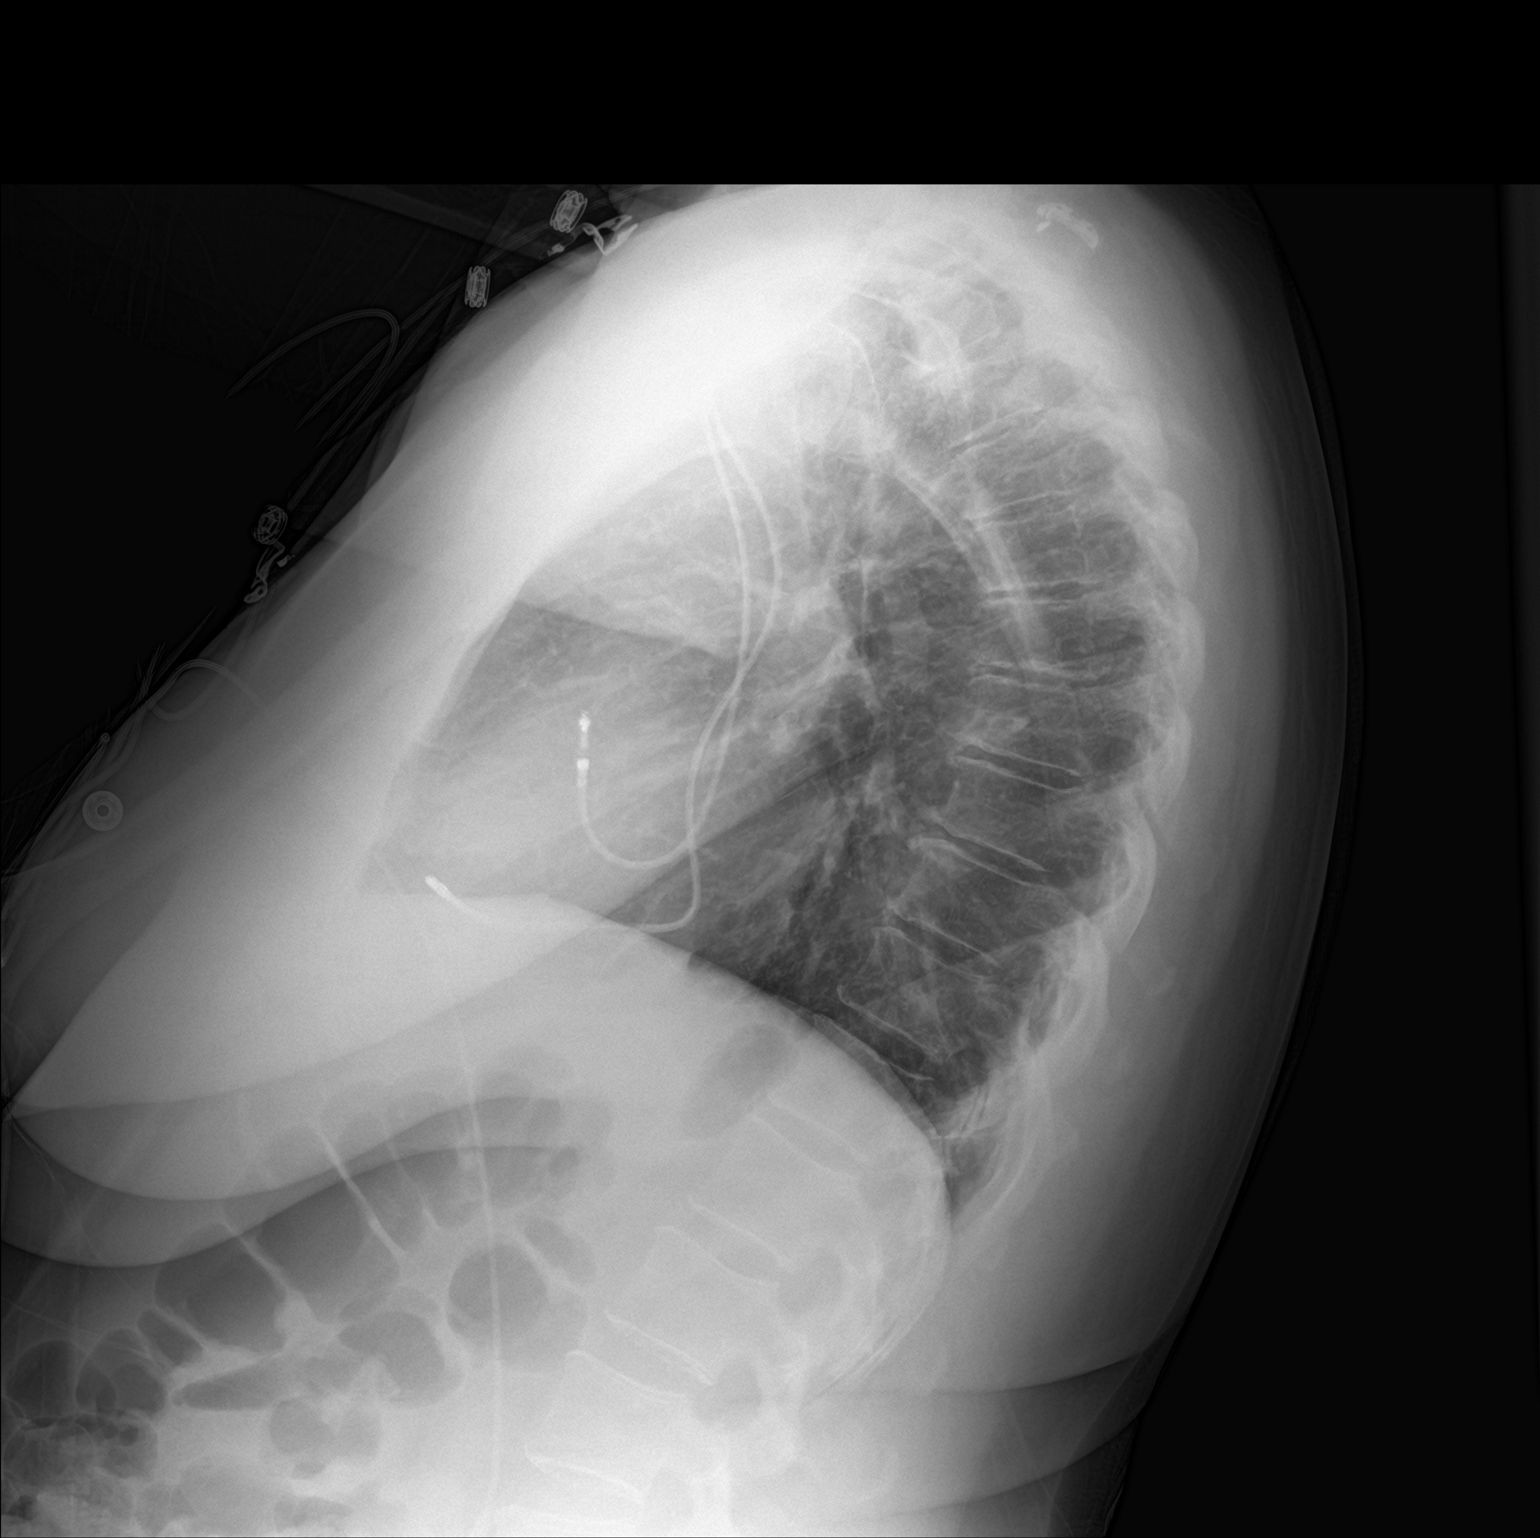

[2 of 2 positions shown; findings below may reference images not displayed]

FINDINGS: There has been interval placement of dual lead cardiac pacemaker
with leads overlying the expected location of right atrium and right
ventricle.

Cardiomediastinal silhouette is normal. Mediastinal contours appear
intact.

There is no evidence of focal airspace consolidation, pleural
effusion or pneumothorax. Low lung volumes.

Osseous structures are without acute abnormality. Soft tissues are
grossly normal.
IMPRESSION: Low lung volumes.

No evidence of pneumothorax status post cardiac pacemaker placement.

## 2016-08-30 ENCOUNTER — Other Ambulatory Visit: Payer: Self-pay | Admitting: Internal Medicine

## 2016-08-31 ENCOUNTER — Ambulatory Visit (INDEPENDENT_AMBULATORY_CARE_PROVIDER_SITE_OTHER): Payer: BLUE CROSS/BLUE SHIELD | Admitting: *Deleted

## 2016-08-31 DIAGNOSIS — I442 Atrioventricular block, complete: Secondary | ICD-10-CM

## 2016-08-31 NOTE — Progress Notes (Signed)
Remote pacemaker transmission.   

## 2016-09-06 ENCOUNTER — Encounter: Payer: Self-pay | Admitting: Cardiology

## 2016-09-08 ENCOUNTER — Encounter: Payer: Self-pay | Admitting: Cardiology

## 2016-09-23 LAB — CUP PACEART REMOTE DEVICE CHECK
Brady Statistic RA Percent Paced: 1 % — CL
Brady Statistic RV Percent Paced: 99 %
Date Time Interrogation Session: 20180104132014
Implantable Lead Implant Date: 20170608
Implantable Lead Implant Date: 20170608
Implantable Lead Location: 753859
Implantable Lead Location: 753860
Implantable Pulse Generator Implant Date: 20170608
Lead Channel Impedance Value: 440 Ohm
Lead Channel Impedance Value: 560 Ohm
Lead Channel Pacing Threshold Amplitude: 0.75 V
Lead Channel Pacing Threshold Amplitude: 0.75 V
Lead Channel Pacing Threshold Pulse Width: 0.4 ms
Lead Channel Pacing Threshold Pulse Width: 0.4 ms
Lead Channel Sensing Intrinsic Amplitude: 12 mV
Lead Channel Sensing Intrinsic Amplitude: 2 mV
Pulse Gen Model: 2272
Pulse Gen Serial Number: 7903881

## 2016-10-18 ENCOUNTER — Ambulatory Visit: Payer: BLUE CROSS/BLUE SHIELD | Admitting: Internal Medicine

## 2016-11-06 ENCOUNTER — Other Ambulatory Visit: Payer: Self-pay | Admitting: Internal Medicine

## 2016-11-20 ENCOUNTER — Other Ambulatory Visit: Payer: Self-pay | Admitting: Family Medicine

## 2016-11-20 DIAGNOSIS — I10 Essential (primary) hypertension: Secondary | ICD-10-CM

## 2016-11-20 NOTE — Telephone Encounter (Signed)
No longer Dr. Cyndie Chime pt, will rout to PCP

## 2016-11-22 NOTE — Telephone Encounter (Signed)
Ok to refill,  Refill sent but patient needs nonfasting labs asap.  Ordered   Lab Results  Component Value Date   NA 138 03/16/2016   K 4.2 03/16/2016   CL 99 03/16/2016   CO2 32 03/16/2016

## 2016-11-30 ENCOUNTER — Telehealth: Payer: Self-pay | Admitting: Cardiology

## 2016-11-30 ENCOUNTER — Ambulatory Visit (INDEPENDENT_AMBULATORY_CARE_PROVIDER_SITE_OTHER): Payer: BLUE CROSS/BLUE SHIELD | Admitting: *Deleted

## 2016-11-30 DIAGNOSIS — I442 Atrioventricular block, complete: Secondary | ICD-10-CM

## 2016-11-30 NOTE — Telephone Encounter (Signed)
Spoke with pt and reminded pt of remote transmission that is due today. Pt verbalized understanding.   

## 2016-12-01 ENCOUNTER — Encounter: Payer: Self-pay | Admitting: Cardiology

## 2016-12-01 NOTE — Progress Notes (Signed)
Remote pacemaker transmission.   

## 2016-12-03 LAB — CUP PACEART REMOTE DEVICE CHECK
Battery Remaining Longevity: 110 mo
Battery Remaining Percentage: 95 %
Battery Voltage: 2.99 V
Brady Statistic RA Percent Paced: 1 % — CL
Brady Statistic RV Percent Paced: 99 %
Date Time Interrogation Session: 20180316111604
Implantable Lead Implant Date: 20170608
Implantable Lead Implant Date: 20170608
Implantable Lead Location: 753859
Implantable Lead Location: 753860
Implantable Pulse Generator Implant Date: 20170608
Lead Channel Impedance Value: 430 Ohm
Lead Channel Impedance Value: 540 Ohm
Lead Channel Pacing Threshold Amplitude: 0.875 V
Lead Channel Pacing Threshold Pulse Width: 0.4 ms
Lead Channel Sensing Intrinsic Amplitude: 4.8 mV
Lead Channel Setting Pacing Amplitude: 1.125
Lead Channel Setting Pacing Amplitude: 2 V
Lead Channel Setting Pacing Pulse Width: 0.4 ms
Lead Channel Setting Sensing Sensitivity: 2.5 mV
Pulse Gen Model: 2272
Pulse Gen Serial Number: 7903881

## 2016-12-07 ENCOUNTER — Other Ambulatory Visit: Payer: Self-pay | Admitting: Internal Medicine

## 2016-12-13 ENCOUNTER — Ambulatory Visit (INDEPENDENT_AMBULATORY_CARE_PROVIDER_SITE_OTHER): Payer: BLUE CROSS/BLUE SHIELD | Admitting: Internal Medicine

## 2016-12-13 ENCOUNTER — Telehealth: Payer: Self-pay | Admitting: *Deleted

## 2016-12-13 ENCOUNTER — Encounter: Payer: Self-pay | Admitting: Internal Medicine

## 2016-12-13 VITALS — BP 152/96 | HR 95 | Temp 97.8°F | Resp 15 | Ht 63.5 in | Wt 196.4 lb

## 2016-12-13 DIAGNOSIS — E7800 Pure hypercholesterolemia, unspecified: Secondary | ICD-10-CM

## 2016-12-13 DIAGNOSIS — R5383 Other fatigue: Secondary | ICD-10-CM

## 2016-12-13 DIAGNOSIS — D582 Other hemoglobinopathies: Secondary | ICD-10-CM

## 2016-12-13 DIAGNOSIS — E785 Hyperlipidemia, unspecified: Secondary | ICD-10-CM | POA: Diagnosis not present

## 2016-12-13 DIAGNOSIS — F411 Generalized anxiety disorder: Secondary | ICD-10-CM | POA: Diagnosis not present

## 2016-12-13 DIAGNOSIS — I442 Atrioventricular block, complete: Secondary | ICD-10-CM

## 2016-12-13 DIAGNOSIS — J8410 Pulmonary fibrosis, unspecified: Secondary | ICD-10-CM

## 2016-12-13 DIAGNOSIS — I10 Essential (primary) hypertension: Secondary | ICD-10-CM

## 2016-12-13 DIAGNOSIS — E669 Obesity, unspecified: Secondary | ICD-10-CM | POA: Diagnosis not present

## 2016-12-13 DIAGNOSIS — E559 Vitamin D deficiency, unspecified: Secondary | ICD-10-CM

## 2016-12-13 LAB — IBC PANEL
Iron: 128 ug/dL (ref 42–145)
Saturation Ratios: 25 % (ref 20.0–50.0)
Transferrin: 366 mg/dL — ABNORMAL HIGH (ref 212.0–360.0)

## 2016-12-13 LAB — LIPID PANEL
Cholesterol: 275 mg/dL — ABNORMAL HIGH (ref 0–200)
HDL: 49.3 mg/dL (ref >39.00)
NonHDL: 225.48
Total CHOL/HDL Ratio: 6
Triglycerides: 320 mg/dL — ABNORMAL HIGH (ref 0.0–149.0)
VLDL: 64 mg/dL — ABNORMAL HIGH (ref 0.0–40.0)

## 2016-12-13 LAB — COMPREHENSIVE METABOLIC PANEL
ALT: 16 U/L (ref 0–35)
AST: 17 U/L (ref 0–37)
Albumin: 4.9 g/dL (ref 3.5–5.2)
Alkaline Phosphatase: 47 U/L (ref 39–117)
BUN: 10 mg/dL (ref 6–23)
CO2: 31 mEq/L (ref 19–32)
Calcium: 9.9 mg/dL (ref 8.4–10.5)
Chloride: 99 mEq/L (ref 96–112)
Creatinine, Ser: 0.61 mg/dL (ref 0.40–1.20)
GFR: 108.63 mL/min (ref >60.00)
Glucose, Bld: 96 mg/dL (ref 70–99)
Potassium: 3.9 mEq/L (ref 3.5–5.1)
Sodium: 138 mEq/L (ref 135–145)
Total Bilirubin: 0.6 mg/dL (ref 0.2–1.2)
Total Protein: 7.8 g/dL (ref 6.0–8.3)

## 2016-12-13 LAB — LDL CHOLESTEROL, DIRECT: Direct LDL: 178 mg/dL

## 2016-12-13 LAB — FERRITIN: Ferritin: 139.9 ng/mL (ref 10.0–291.0)

## 2016-12-13 LAB — VITAMIN D 25 HYDROXY (VIT D DEFICIENCY, FRACTURES): VITD: 17.13 ng/mL — ABNORMAL LOW (ref 30.00–100.00)

## 2016-12-13 LAB — TSH: TSH: 2.4 u[IU]/mL (ref 0.35–4.50)

## 2016-12-13 MED ORDER — AMLODIPINE BESYLATE 5 MG PO TABS
5.0000 mg | ORAL_TABLET | Freq: Every day | ORAL | 3 refills | Status: DC
Start: 2016-12-13 — End: 2017-03-14

## 2016-12-13 MED ORDER — DIAZEPAM 5 MG PO TABS: 5.0000 mg | ORAL_TABLET | Freq: Every day | ORAL | 5 refills | Status: DC | PRN

## 2016-12-13 NOTE — Telephone Encounter (Signed)
Pt had labs drawn after office visit and was unable to collect a tube for the CBC & may not have enough for all of the remaining labs. I am sending what I have to the lab & we will see what they were unable to run. Pt aware that she will need to come back for the remainder of labs but she wanted to take something for her nerves before she comes back. Please have CMA schedule her lab once her results from today come back.

## 2016-12-13 NOTE — Progress Notes (Signed)
Pre visit review using our clinic review tool, if applicable. No additional management support is needed unless otherwise documented below in the visit note. 

## 2016-12-13 NOTE — Progress Notes (Signed)
Subjective:  Patient ID: Anne Rush, female    DOB: Jul 02, 1961  Age: 56 y.o. MRN: [id number]  CC: The primary encounter diagnosis was Pure hypercholesterolemia. Diagnoses of Other fatigue, Vitamin D deficiency, Elevated hemoglobin (HCC), Essential hypertension, Complete heart block (HCC), Postinflammatory pulmonary fibrosis (HCC), Obesity (BMI 30-39.9), Hyperlipidemia LDL goal <100, and GAD (generalized anxiety disorder) were also pertinent to this visit.  HPI Anne Rush presents for FOLLOW UP ON HYPERTENSION, HYPERLIPIDEMIA, obesity and anxiety.   Patient is taking her medications as prescribed and notes no adverse effects.  Home BP readings have been done about once per week and are  generally > 130/80 .  She is avoiding added salt in her diet. She has stopped walking regularly due to various orthopedic complaints involving the feet and  Knees and has gained 9 lbs since her last visit .  She does feel motivated to resume a  Carbohydrate restricted diet    GAD:  She is using DIAZEPAM prn ANXIETY , NOT DAILY.     Works part time,   Still gets sleepy in the afternoon.  takes a 2 to 4 hour nap every day. Told she snores at times but no apneic events commented on by husband. No prior sleep study.     Lab Results  Component Value Date   CHOL 275 (H) 12/13/2016   HDL 49.30 12/13/2016   LDLCALC 149 (H) 05/12/2015   LDLDIRECT 178.0 12/13/2016   TRIG 320.0 (H) 12/13/2016   CHOLHDL 6 12/13/2016      Outpatient Medications Prior to Visit  Medication Sig Dispense Refill  . carvedilol (COREG) 12.5 MG tablet TAKE ONE TABLET TWO TIMES A DAY WITH FOOD 60 tablet 0  . hydrochlorothiazide (HYDRODIURIL) 25 MG tablet TAKE 1 TABLET (25 MG TOTAL) BY MOUTH DAILY. 30 tablet 6  . nortriptyline (PAMELOR) 10 MG capsule Take 20 mg by mouth at bedtime.     . diazepam (VALIUM) 5 MG tablet Take 0.5-1 tablets (2.5-5 mg total) by mouth 2 (two) times daily as needed for anxiety. 60 tablet 1  . aspirin EC  81 MG tablet Take 81 mg by mouth once. Reported on 03/16/2016     No facility-administered medications prior to visit.     Review of Systems;  Patient denies headache, fevers, malaise, unintentional weight loss, skin rash, eye pain, sinus congestion and sinus pain, sore throat, dysphagia,  hemoptysis , cough, dyspnea, wheezing, chest pain, palpitations, orthopnea, edema, abdominal pain, nausea, melena, diarrhea, constipation, flank pain, dysuria, hematuria, urinary  Frequency, nocturia, numbness, tingling, seizures,  Focal weakness, Loss of consciousness,  Tremor, insomnia, depression, anxiety, and suicidal ideation.      Objective:  BP (!) 152/96 (BP Location: Left Arm, Patient Position: Sitting, Cuff Size: Large)   Pulse 95   Temp 97.8 F (36.6 C) (Oral)   Resp 15   Ht 5' 3.5" (1.613 m)   Wt 196 lb 6.4 oz (89.1 kg)   SpO2 97%   BMI 34.24 kg/m   BP Readings from Last 3 Encounters:  12/13/16 (!) 152/96  06/01/16 (!) 138/102  05/12/16 (!) 130/92    Wt Readings from Last 3 Encounters:  12/13/16 196 lb 6.4 oz (89.1 kg)  06/01/16 190 lb 6.4 oz (86.4 kg)  05/12/16 188 lb 9.6 oz (85.5 kg)    General appearance: alert, cooperative and appears stated age Ears: normal TM's and external ear canals both ears Throat: lips, mucosa, and tongue normal; teeth and gums normal Neck: no adenopathy,  no carotid bruit, supple, symmetrical, trachea midline and thyroid not enlarged, symmetric, no tenderness/mass/nodules Back: symmetric, no curvature. ROM normal. No CVA tenderness. Lungs: clear to auscultation bilaterally Heart: regular rate and rhythm, S1, S2 normal, no murmur, click, rub or gallop Abdomen: soft, non-tender; bowel sounds normal; no masses,  no organomegaly Pulses: 2+ and symmetric Skin: Skin color, texture, turgor normal. No rashes or lesions Lymph nodes: Cervical, supraclavicular, and axillary nodes normal.  Lab Results  Component Value Date   HGBA1C 5.6 04/24/2014    Lab  Results  Component Value Date   CREATININE 0.61 12/13/2016   CREATININE 0.60 03/16/2016   CREATININE 0.68 02/26/2016    Lab Results  Component Value Date   WBC 8.2 03/16/2016   HGB 15.4 (H) 03/16/2016   HCT 44.6 03/16/2016   PLT 271.0 03/16/2016   GLUCOSE 96 12/13/2016   CHOL 275 (H) 12/13/2016   TRIG 320.0 (H) 12/13/2016   HDL 49.30 12/13/2016   LDLDIRECT 178.0 12/13/2016   LDLCALC 149 (H) 05/12/2015   ALT 16 12/13/2016   AST 17 12/13/2016   NA 138 12/13/2016   K 3.9 12/13/2016   CL 99 12/13/2016   CREATININE 0.61 12/13/2016   BUN 10 12/13/2016   CO2 31 12/13/2016   TSH 2.40 12/13/2016   INR 1.0 10/12/2010   HGBA1C 5.6 04/24/2014    Dg Chest 2 View  Result Date: 02/27/2016 CLINICAL DATA:  Post cardiac pacemaker placement. EXAM: CHEST  2 VIEW COMPARISON:  02/26/2016 FINDINGS: There has been interval placement of dual lead cardiac pacemaker with leads overlying the expected location of right atrium and right ventricle. Cardiomediastinal silhouette is normal. Mediastinal contours appear intact. There is no evidence of focal airspace consolidation, pleural effusion or pneumothorax. Low lung volumes. Osseous structures are without acute abnormality. Soft tissues are grossly normal. IMPRESSION: Low lung volumes. No evidence of pneumothorax status post cardiac pacemaker placement. Electronically Signed   By: Ted Mcalpine M.D.   On: 02/27/2016 07:40   Dg Chest 2 View  Result Date: 02/26/2016 CLINICAL DATA:  Chest pain for 6 days and bradycardia. Shortness of breath on exertion EXAM: CHEST  2 VIEW COMPARISON:  Chest radiograph August 27, 2010 and chest CT October 08, 2010 FINDINGS: There is generalized interstitial prominence, stable. There is no frank edema or consolidation. There is mild scarring in the left base. Heart size and pulmonary vascularity are normal. No adenopathy. No bone lesions. IMPRESSION: Generalized interstitial prominence, likely due to underlying fibrotic  type change. No edema or consolidation. Mild scarring left base. No new opacity. Stable cardiac silhouette. Electronically Signed   By: Bretta Bang III M.D.   On: 02/26/2016 14:51    Assessment & Plan:   Problem List Items Addressed This Visit    Complete heart block (HCC)    Etiology unclear.  She is  s/p pacemaker implantation during hospital admission Jun 2017.      Relevant Medications   amLODipine (NORVASC) 5 MG tablet   Essential hypertension    Not well controlled on current dose of carvedilol and hctz .  Given her persistent fatigue, may need to consider secondary causes including OSA  Adding amlodipine 5 mg daily       Relevant Medications   amLODipine (NORVASC) 5 MG tablet   GAD (generalized anxiety disorder)    Managed with prn valium and qhs pamelor. The risks and benefits of benzodiazepine use were discussed with patient today including excessive sedation leading to respiratory depression,  impaired thinking/driving,  and addiction.  Patient was advised to avoid concurrent use with alcohol, to use medication only as needed and not to share with others  .       Hyperlipidemia LDL goal <100    Using the Framingham risk calculator,  her 10 year risk of coronary artery disease is 17%.will recommend statin therapy  Lab Results  Component Value Date   CHOL 275 (H) 12/13/2016   HDL 49.30 12/13/2016   LDLCALC 149 (H) 05/12/2015   LDLDIRECT 178.0 12/13/2016   TRIG 320.0 (H) 12/13/2016   CHOLHDL 6 12/13/2016         Relevant Medications   amLODipine (NORVASC) 5 MG tablet   Obesity (BMI 30-39.9)    I have addressed  BMI and recommended wt loss of 10% of body weight over the next 6 months using a low fat, low starch, high protein  fruit/vegetable based Mediterranean diet and 30 minutes of aerobic exercise a minimum of 5 days per week.          Postinflammatory pulmonary fibrosis (HCC)    Diagnosed by Carney Bern, [facility] in 2014 with superimposed early centrilobular  emphysema.  PFTS improved with smoking cessation per review of [facility] notes.  Annual chest CT has not been done since 2016       Other Visit Diagnoses    Pure hypercholesterolemia    -  Primary   Relevant Medications   amLODipine (NORVASC) 5 MG tablet   Other Relevant Orders   Lipid panel (Completed)   Other fatigue       Relevant Orders   Comprehensive metabolic panel (Completed)   TSH (Completed)   CBC w/Diff   Vitamin D deficiency       Relevant Orders   VITAMIN D 25 Hydroxy (Vit-D Deficiency, Fractures) (Completed)   Elevated hemoglobin (HCC)       Relevant Orders   Ferritin (Completed)   IBC panel (Completed)   CBC w/Diff      I have discontinued Ms. Hardt's aspirin EC. I have also changed her diazepam. Additionally, I am having her start on amLODipine. Lastly, I am having her maintain her nortriptyline, hydrochlorothiazide, and carvedilol.  Meds ordered this encounter  Medications  . diazepam (VALIUM) 5 MG tablet    Sig: Take 1 tablet (5 mg total) by mouth daily as needed for anxiety.    Dispense:  30 tablet    Refill:  5  . amLODipine (NORVASC) 5 MG tablet    Sig: Take 1 tablet (5 mg total) by mouth daily.    Dispense:  90 tablet    Refill:  3    Medications Discontinued During This Encounter  Medication Reason  . aspirin EC 81 MG tablet Patient has not taken in last 30 days  . diazepam (VALIUM) 5 MG tablet Reorder    Follow-up: No Follow-up on file.   Sherlene Shams, MD

## 2016-12-13 NOTE — Patient Instructions (Addendum)
Adding amlodipine 5 mg daily for blood pressure ,  Either morning or evening,  No particular difference.  Goal is 30 minutes of cardio 5 days per week    To make a low carb chip :  Take the Joseph's Lavash or Pita bread,  Or the [place] Low carb whole wheat tortilla   Place on metal cookie sheet  Brush with olive oil  Sprinkle garlic powder (NOT garlic salt), grated parmesan cheese, mediterranean seasoning , or all of them?  Bake at 275 for 30 minutes   We have substitutions for your potatoes!!  Try the mashed cauliflower and riced cauliflower dishes instead of rice and mashed potatoes  Mashed turnips are also very low carb!   For desserts :  Try the Dannon Lt n Fit greek yogurt dessert flavors and top with reddi Whip .  8 carbs,  80 calories  Try Oikos Triple Zero [place] Yogurt in the salted caramel, and the coffee flavors  With Whipped Cream for dessert  breyer's low carb ice cream, available in bars (on a stick, better ) or scoopable ice cream  HERE ARE THE LOW CARB  BREAD CHOICES

## 2016-12-14 NOTE — Assessment & Plan Note (Signed)
Etiology unclear.  She is  s/p pacemaker implantation during hospital admission Jun 2017.

## 2016-12-14 NOTE — Assessment & Plan Note (Addendum)
I have addressed  BMI and recommended wt loss of 10% of body weight over the next 6 months using a low fat, low starch, high protein  fruit/vegetable based Mediterranean diet and 30 minutes of aerobic exercise a minimum of 5 days per week.   

## 2016-12-14 NOTE — Assessment & Plan Note (Addendum)
Using the Framingham risk calculator,  her 10 year risk of coronary artery disease is 17%.will recommend statin therapy  Lab Results  Component Value Date   CHOL 275 (H) 12/13/2016   HDL 49.30 12/13/2016   LDLCALC 149 (H) 05/12/2015   LDLDIRECT 178.0 12/13/2016   TRIG 320.0 (H) 12/13/2016   CHOLHDL 6 12/13/2016

## 2016-12-14 NOTE — Assessment & Plan Note (Signed)
Not well controlled on current dose of carvedilol and hctz .  Given her persistent fatigue, may need to consider secondary causes including OSA  Adding amlodipine 5 mg daily

## 2016-12-14 NOTE — Assessment & Plan Note (Addendum)
Diagnosed by Carney Bern, [facility] in 2014 with superimposed early centrilobular emphysema.  PFTS improved with smoking cessation per review of [facility] notes.  Annual chest CT has not been done since 2016

## 2016-12-14 NOTE — Assessment & Plan Note (Signed)
Managed with prn valium and qhs pamelor. The risks and benefits of benzodiazepine use were discussed with patient today including excessive sedation leading to respiratory depression,  impaired thinking/driving, and addiction.  Patient was advised to avoid concurrent use with alcohol, to use medication only as needed and not to share with others  .

## 2016-12-16 ENCOUNTER — Other Ambulatory Visit: Payer: Self-pay | Admitting: Internal Medicine

## 2016-12-16 ENCOUNTER — Encounter: Payer: Self-pay | Admitting: Internal Medicine

## 2016-12-16 DIAGNOSIS — E785 Hyperlipidemia, unspecified: Secondary | ICD-10-CM

## 2016-12-16 DIAGNOSIS — I10 Essential (primary) hypertension: Secondary | ICD-10-CM

## 2016-12-16 DIAGNOSIS — D582 Other hemoglobinopathies: Secondary | ICD-10-CM

## 2016-12-16 MED ORDER — ERGOCALCIFEROL 1.25 MG (50000 UT) PO CAPS: 50000.0000 [IU] | ORAL_CAPSULE | ORAL | 2 refills | Status: DC

## 2016-12-16 NOTE — Progress Notes (Signed)
last a1c?

## 2016-12-27 DIAGNOSIS — D485 Neoplasm of uncertain behavior of skin: Secondary | ICD-10-CM | POA: Diagnosis not present

## 2016-12-27 DIAGNOSIS — C44329 Squamous cell carcinoma of skin of other parts of face: Secondary | ICD-10-CM | POA: Diagnosis not present

## 2017-01-05 ENCOUNTER — Other Ambulatory Visit: Payer: Self-pay | Admitting: Internal Medicine

## 2017-01-31 DIAGNOSIS — L905 Scar conditions and fibrosis of skin: Secondary | ICD-10-CM | POA: Diagnosis not present

## 2017-01-31 DIAGNOSIS — C44329 Squamous cell carcinoma of skin of other parts of face: Secondary | ICD-10-CM | POA: Diagnosis not present

## 2017-02-04 ENCOUNTER — Other Ambulatory Visit: Payer: Self-pay | Admitting: Internal Medicine

## 2017-03-05 ENCOUNTER — Other Ambulatory Visit: Payer: Self-pay | Admitting: Internal Medicine

## 2017-03-07 DIAGNOSIS — L570 Actinic keratosis: Secondary | ICD-10-CM | POA: Diagnosis not present

## 2017-03-14 ENCOUNTER — Ambulatory Visit (INDEPENDENT_AMBULATORY_CARE_PROVIDER_SITE_OTHER): Payer: BLUE CROSS/BLUE SHIELD | Admitting: Cardiology

## 2017-03-14 ENCOUNTER — Encounter: Payer: Self-pay | Admitting: Cardiology

## 2017-03-14 VITALS — BP 144/100 | HR 104 | Ht 63.0 in | Wt 199.8 lb

## 2017-03-14 DIAGNOSIS — R0602 Shortness of breath: Secondary | ICD-10-CM

## 2017-03-14 DIAGNOSIS — I10 Essential (primary) hypertension: Secondary | ICD-10-CM | POA: Diagnosis not present

## 2017-03-14 DIAGNOSIS — I428 Other cardiomyopathies: Secondary | ICD-10-CM

## 2017-03-14 DIAGNOSIS — I442 Atrioventricular block, complete: Secondary | ICD-10-CM | POA: Diagnosis not present

## 2017-03-14 LAB — CUP PACEART INCLINIC DEVICE CHECK
Battery Remaining Longevity: 118 mo
Battery Voltage: 2.99 V
Brady Statistic RA Percent Paced: 0.15 %
Brady Statistic RV Percent Paced: 99.99 %
Date Time Interrogation Session: 20180625113003
Implantable Lead Implant Date: 20170608
Implantable Lead Implant Date: 20170608
Implantable Lead Location: 753859
Implantable Lead Location: 753860
Implantable Pulse Generator Implant Date: 20170608
Lead Channel Impedance Value: 425 Ohm
Lead Channel Impedance Value: 562.5 Ohm
Lead Channel Pacing Threshold Amplitude: 0.75 V
Lead Channel Pacing Threshold Amplitude: 0.75 V
Lead Channel Pacing Threshold Pulse Width: 0.4 ms
Lead Channel Pacing Threshold Pulse Width: 0.4 ms
Lead Channel Sensing Intrinsic Amplitude: 12 mV
Lead Channel Sensing Intrinsic Amplitude: 4.3 mV
Lead Channel Setting Pacing Amplitude: 1 V
Lead Channel Setting Pacing Amplitude: 2 V
Lead Channel Setting Pacing Pulse Width: 0.4 ms
Lead Channel Setting Sensing Sensitivity: 2.5 mV
Pulse Gen Model: 2272
Pulse Gen Serial Number: 7903881

## 2017-03-14 MED ORDER — CARVEDILOL 25 MG PO TABS: 25.0000 mg | ORAL_TABLET | Freq: Two times a day (BID) | ORAL | 3 refills | Status: DC

## 2017-03-14 NOTE — Patient Instructions (Addendum)
Your physician has recommended you make the following change in your medication:  INCREASE CARVEDILOL TO 25 MG TWICE DAILY   Your physician has requested that you have an echocardiogram. Echocardiography is a painless test that uses sound waves to create images of your heart. It provides your doctor with information about the size and shape of your heart and how well your heart's chambers and valves are working. This procedure takes approximately one hour. There are no restrictions for this procedure.   Your physician wants you to follow-up in: 6 MONTHS WITH DR  Elberta Fortis  You will receive a reminder letter in the mail two months in advance. If you don't receive a letter, please call our office to schedule the follow-up appointment. REMOTE  DEVICE  CHECK   9-24 -18 CALL OFFICE IN  COUPLE  OF WEEKS WITH B/P READINGS

## 2017-03-14 NOTE — Progress Notes (Signed)
Electrophysiology Office Note   Date:  03/14/2017   ID:  HARLEYQUINN GASSER, DOB 02-15-1961, MRN [id number]  PCP:  Sherlene Shams, MD  Primary Electrophysiologist:  Regan Lemming, MD    Chief Complaint  Patient presents with  . Pacemaker Check    complete heart block     History of Present Illness: Anne Rush is a 56 y.o. female who presents today for electrophysiology evaluation.   She presented to the hospital in complete heart block and had a St. Jude dual-chamber pacemaker placed on 02/26/16.   Today, denies symptoms of palpitations, chest pain, shortness of breath, orthopnea, PND, lower extremity edema, claudication, dizziness, presyncope, syncope, bleeding, or neurologic sequela. The patient is tolerating medications without difficulties. She does say that she gets fatigued throughout the day. She has been sleeping more than usual. She is also been having dizziness when she stands. Otherwise no chest pain or shortness of breath.   Past Medical History:  Diagnosis Date  . Anxiety   . Hyperlipidemia   . Hypertension   . Interstitial lung disease (HCC)   . Pollen allergies   . Squamous cell carcinoma 2014   skin cancer  . Subclinical hyperthyroidism 06/17/2014   Past Surgical History:  Procedure Laterality Date  . COSMETIC SURGERY    . EP IMPLANTABLE DEVICE N/A 02/26/2016   Procedure: Pacemaker Implant;  Surgeon: Will Jorja Loa, MD;  Location: [facility];  Service: Cardiovascular;  Laterality: N/A;  . TUBAL LIGATION       Current Outpatient Prescriptions  Medication Sig Dispense Refill  . carvedilol (COREG) 25 MG tablet Take 1 tablet (25 mg total) by mouth 2 (two) times daily with a meal. 180 tablet 3  . diazepam (VALIUM) 5 MG tablet Take 1 tablet (5 mg total) by mouth daily as needed for anxiety. 30 tablet 5  . hydrochlorothiazide (HYDRODIURIL) 25 MG tablet TAKE 1 TABLET (25 MG TOTAL) BY MOUTH DAILY. 30 tablet 6  . nortriptyline (PAMELOR) 10 MG  capsule Take 20 mg by mouth at bedtime.      No current facility-administered medications for this visit.     Allergies:   Codeine; Oxycodone-acetaminophen; Paroxetine; Sulfonamide derivatives; and Lisinopril   Social History:  The patient  reports that she has quit smoking. She has never used smokeless tobacco. She reports that she does not drink alcohol or use drugs.   Family History:  The patient's family history includes Anxiety disorder in her mother; Cancer in her maternal aunt; Heart failure in her maternal grandfather; Hypertension in her mother.    ROS:  Please see the history of present illness.   Otherwise, review of systems is positive for anxiety.   All other systems are reviewed and negative.     PHYSICAL EXAM: VS:  BP (!) 144/100   Pulse (!) 104   Ht 5\' 3"  (1.6 m)   Wt 199 lb 12.8 oz (90.6 kg)   BMI 35.39 kg/m  , BMI Body mass index is 35.39 kg/m. GEN: Well nourished, well developed, in no acute distress  HEENT: normal  Neck: no JVD, carotid bruits, or masses Cardiac: RRR; no murmurs, rubs, or gallops,no edema  Respiratory:  clear to auscultation bilaterally, normal work of breathing GI: soft, nontender, nondistended, + BS MS: no deformity or atrophy  Skin: warm and dry, device site well healed Neuro:  Strength and sensation are intact Psych: euthymic mood, full affect  EKG:  EKG is ordered today. Personal review of  the ekg ordered shows sinus rhythm, V pacing, rate 104   Personal review of the device interrogation today. Results in [facility]    Recent Labs: 03/16/2016: Hemoglobin 15.4; Platelets 271.0 12/13/2016: ALT 16; BUN 10; Creatinine, Ser 0.61; Potassium 3.9; Sodium 138; TSH 2.40    Lipid Panel     Component Value Date/Time   CHOL 275 (H) 12/13/2016 1145   TRIG 320.0 (H) 12/13/2016 1145   HDL 49.30 12/13/2016 1145   CHOLHDL 6 12/13/2016 1145   VLDL 64.0 (H) 12/13/2016 1145   LDLCALC 149 (H) 05/12/2015 0901   LDLDIRECT 178.0 12/13/2016 1145      Wt Readings from Last 3 Encounters:  03/14/17 199 lb 12.8 oz (90.6 kg)  12/13/16 196 lb 6.4 oz (89.1 kg)  06/01/16 190 lb 6.4 oz (86.4 kg)      Other studies Reviewed: Additional studies/ records that were reviewed today include: TTE 06/14/16 Review of the above records today demonstrates:  - Left ventricle: The cavity size was normal. Systolic function was   mildly reduced. The estimated ejection fraction was in the range   of 45% to 50%. Mild diffuse hypokinesis with no identifiable   regional variations. There was an increased relative contribution   of atrial contraction to ventricular filling. Doppler parameters   are consistent with abnormal left ventricular relaxation (grade 1   diastolic dysfunction). - Ventricular septum: Septal motion showed abnormal function,   dyssynergy, and paradox. These changes are consistent with right   ventricular pacing.   ASSESSMENT AND PLAN:  1.  Complete AV block: Status post St. Jude pacemaker. Functioning appropriately. No changes today.  2. Hypertension:  Blood pressure is mildly elevated today. Plan to increase her carvedilol to 25 mg twice a day.  3. Mild systolic heart failure, chronic: Plan to increase her carvedilol to 25 mg. Should her blood pressure remain elevated, we'll plan to start on losartan. She is fatigued and tachycardic, we'll repeat her echocardiogram.  Current medicines are reviewed at length with the patient today.   The patient does not have concerns regarding her medicines.  The following changes were made today:    Labs/ tests ordered today include:  Orders Placed This Encounter  Procedures  . EKG 12-Lead  . ECHOCARDIOGRAM COMPLETE     Disposition:   FU with Will Camnitz 6 months  Signed, Will Jorja Loa, MD  03/14/2017 11:17 AM     [facility] [address] [place] [address] [phone] (office) [phone] (fax)

## 2017-03-29 ENCOUNTER — Other Ambulatory Visit: Payer: Self-pay

## 2017-03-29 ENCOUNTER — Encounter: Payer: Self-pay | Admitting: Cardiology

## 2017-03-29 ENCOUNTER — Ambulatory Visit (HOSPITAL_COMMUNITY): Payer: BLUE CROSS/BLUE SHIELD | Attending: Internal Medicine

## 2017-03-29 DIAGNOSIS — I429 Cardiomyopathy, unspecified: Secondary | ICD-10-CM | POA: Diagnosis not present

## 2017-03-29 DIAGNOSIS — I071 Rheumatic tricuspid insufficiency: Secondary | ICD-10-CM | POA: Diagnosis not present

## 2017-03-29 DIAGNOSIS — R0602 Shortness of breath: Secondary | ICD-10-CM

## 2017-03-29 DIAGNOSIS — I10 Essential (primary) hypertension: Secondary | ICD-10-CM | POA: Insufficient documentation

## 2017-03-30 ENCOUNTER — Encounter: Payer: Self-pay | Admitting: Cardiology

## 2017-04-04 ENCOUNTER — Other Ambulatory Visit: Payer: Self-pay | Admitting: Internal Medicine

## 2017-05-02 ENCOUNTER — Other Ambulatory Visit: Payer: Self-pay | Admitting: Internal Medicine

## 2017-05-16 DIAGNOSIS — Z85828 Personal history of other malignant neoplasm of skin: Secondary | ICD-10-CM | POA: Diagnosis not present

## 2017-05-17 ENCOUNTER — Ambulatory Visit (INDEPENDENT_AMBULATORY_CARE_PROVIDER_SITE_OTHER): Payer: BLUE CROSS/BLUE SHIELD | Admitting: Family

## 2017-05-17 ENCOUNTER — Encounter: Payer: Self-pay | Admitting: Family

## 2017-05-17 VITALS — BP 156/100 | HR 96 | Temp 98.3°F | Ht 63.0 in | Wt 200.4 lb

## 2017-05-17 DIAGNOSIS — M25562 Pain in left knee: Secondary | ICD-10-CM

## 2017-05-17 MED ORDER — DICLOFENAC SODIUM 1 % TD GEL: 4.0000 g | Freq: Four times a day (QID) | TRANSDERMAL | 3 refills | Status: DC

## 2017-05-17 NOTE — Progress Notes (Signed)
Pre visit review using our clinic review tool, if applicable. No additional management support is needed unless otherwise documented below in the visit note. 

## 2017-05-17 NOTE — Progress Notes (Signed)
Subjective:    Patient ID: Anne Rush, female    DOB: 1961-03-18, 56 y.o.   MRN: [id number]  CC: Anne Rush is a 56 y.o. female who presents today for an acute visit.    HPI: CC: left knee pain x 3 months, comes and goes.  Pain with trying to exercise and walking bothers pain. Rubing and wearing sneakers helps with pain. No swelling, cracking, giving way, fever, chills.  NO injury.  Tylenol with no relief.    HTN- following with cardiology for bradycardia then tachycardia; has increased coreg. Has pacemaker.  Denies exertional chest pain or pressure, numbness or tingling radiating to left arm or jaw, palpitations, dizziness, frequent headaches, changes in vision, or shortness of breath.      HISTORY:  Past Medical History:  Diagnosis Date  . Anxiety   . Hyperlipidemia   . Hypertension   . Interstitial lung disease (HCC)   . Pollen allergies   . Squamous cell carcinoma 2014   skin cancer  . Subclinical hyperthyroidism 06/17/2014   Past Surgical History:  Procedure Laterality Date  . COSMETIC SURGERY    . EP IMPLANTABLE DEVICE N/A 02/26/2016   Procedure: Pacemaker Implant;  Surgeon: Will Jorja Loa, MD;  Location: [facility];  Service: Cardiovascular;  Laterality: N/A;  . TUBAL LIGATION     Family History  Problem Relation Age of Onset  . Anxiety disorder Mother   . Hypertension Mother   . Cancer Maternal Aunt        breast  . Heart failure Maternal Grandfather     Allergies: Codeine; Oxycodone-acetaminophen; Paroxetine; Sulfonamide derivatives; and Lisinopril Current Outpatient Prescriptions on File Prior to Visit  Medication Sig Dispense Refill  . carvedilol (COREG) 25 MG tablet Take 1 tablet (25 mg total) by mouth 2 (two) times daily with a meal. 180 tablet 3  . diazepam (VALIUM) 5 MG tablet Take 1 tablet (5 mg total) by mouth daily as needed for anxiety. 30 tablet 5  . hydrochlorothiazide (HYDRODIURIL) 25 MG tablet TAKE 1 TABLET (25 MG TOTAL) BY  MOUTH DAILY. 30 tablet 6  . nortriptyline (PAMELOR) 10 MG capsule Take 20 mg by mouth at bedtime.      No current facility-administered medications on file prior to visit.     Social History  Substance Use Topics  . Smoking status: Former Games developer  . Smokeless tobacco: Never Used  . Alcohol use No    Review of Systems  Constitutional: Negative for chills and fever.  Eyes: Negative for visual disturbance.  Respiratory: Negative for cough and shortness of breath.   Cardiovascular: Negative for chest pain and palpitations.  Gastrointestinal: Negative for nausea and vomiting.  Musculoskeletal: Negative for joint swelling.  Neurological: Negative for headaches.      Objective:    BP (!) 156/100   Pulse 96   Temp 98.3 F (36.8 C) (Oral)   Ht 5\' 3"  (1.6 m)   Wt 200 lb 6.4 oz (90.9 kg)   SpO2 95%   BMI 35.50 kg/m   Wt Readings from Last 3 Encounters:  05/17/17 200 lb 6.4 oz (90.9 kg)  03/14/17 199 lb 12.8 oz (90.6 kg)  12/13/16 196 lb 6.4 oz (89.1 kg)   BP Readings from Last 3 Encounters:  05/17/17 (!) 156/100  03/14/17 (!) 144/100  12/13/16 (!) 152/96    Physical Exam  Constitutional: Anne Rush appears well-developed and well-nourished.  Eyes: Conjunctivae are normal.  Cardiovascular: Normal rate, regular rhythm, normal heart  sounds and normal pulses.   Pulmonary/Chest: Effort normal and breath sounds normal. Anne Rush has no wheezes. Anne Rush has no rhonchi. Anne Rush has no rales.  Musculoskeletal:       Left knee: Anne Rush exhibits normal range of motion, no swelling, no effusion, no ecchymosis, no deformity and no erythema. No tenderness found.  Bilateral knees are symmetric. No effusion appreciated. No increase in warmth or erythema. Crepitus felt with flexion of bilateral knees.  Left knee:  Able to extend to -5 to 10 degrees and flex to 110 degrees. No catching with McMurray maneuver. No patellar apprehension. Negative anterior drawer and lachman's- no laxity appreciated.  No calf  tenderness of lower leg edema bilaterally.    Neurological: Anne Rush is alert.  Skin: Skin is warm and dry.  Psychiatric: Anne Rush has a normal mood and affect. Anne Rush speech is normal and behavior is normal. Thought content normal.  Vitals reviewed.      Assessment & Plan:   1. Acute pain of left knee Reassured by knee exam. As I discussed with patient, my suspicion is osteoarthritic in nature. We jointly agreed conservative therapy at this time. If no improvement, will consult orthopedics and pursue imaging. Return precautions given. - Ambulatory referral to Physical Therapy - diclofenac sodium (VOLTAREN) 1 % GEL; Apply 4 g topically 4 (four) times daily.  Dispense: 1 Tube; Refill: 3 Note, blood pressures elevated today. Patient is closely followed by cardiology; discussed  based on Anne Rush cardiac history complete heart block, pacemaker, I would advise Anne Rush call cardiologist office today to inform him blood pressures elevated. Patient is agreeable with plan.   I am having Anne Rush start on diclofenac sodium. I am also having Anne Rush maintain Anne Rush nortriptyline, hydrochlorothiazide, diazepam, and carvedilol.   Meds ordered this encounter  Medications  . diclofenac sodium (VOLTAREN) 1 % GEL    Sig: Apply 4 g topically 4 (four) times daily.    Dispense:  1 Tube    Refill:  3    Order Specific Question:   Supervising Provider    Answer:   Sherlene Shams [[id number]]    Return precautions given.   Risks, benefits, and alternatives of the medications and treatment plan prescribed today were discussed, and patient expressed understanding.   Education regarding symptom management and diagnosis given to patient on AVS.  Continue to follow with Sherlene Shams, MD for routine health maintenance.   Anne Pates Hastings and I agreed with plan.   Rennie Plowman, FNP

## 2017-05-17 NOTE — Patient Instructions (Signed)
As discussed, please call your cardiologist and inform him that blood pressures have been running high. As I stated today, I do not feel comfortable augmenting your blood pressure medications without  Your cardiologist's approval. Please monitor your blood pressure and if any problems reaching your cardiologist, please call our office to let us know As discussed for your knee, he may try over-the-counter Aspercreme, icy hot Trial of Voltaren gel- this is an ibuprofen Ice after exercise 20 minutes Neoprene sleeve The exercises as discussed Referral to physical therapy Blood pressure cuff as I discussed- good brand Omron  If there is no improvement in your symptoms, or if there is any worsening of symptoms, or if you have any additional concerns, please return for re-evaluation; or, if we are closed, consider going to the Emergency Room for evaluation if symptoms urgent.   Knee Exercises Ask your health care provider which exercises are safe for you. Do exercises exactly as told by your health care provider and adjust them as directed. It is normal to feel mild stretching, pulling, tightness, or discomfort as you do these exercises, but you should stop right away if you feel sudden pain or your pain gets worse.Do not begin these exercises until told by your health care provider. STRETCHING AND RANGE OF MOTION EXERCISES These exercises warm up your muscles and joints and improve the movement and flexibility of your knee. These exercises also help to relieve pain, numbness, and tingling. Exercise A: Knee Extension, Prone 1. Lie on your abdomen on a bed. 2. Place your left / right knee just beyond the edge of the surface so your knee is not on the bed. You can put a towel under your left / right thigh just above your knee for comfort. 3. Relax your leg muscles and allow gravity to straighten your knee. You should feel a stretch behind your left / right knee. 4. Hold this position for __________  seconds. 5. Scoot up so your knee is supported between repetitions. Repeat __________ times. Complete this stretch __________ times a day. Exercise B: Knee Flexion, Active  1. Lie on your back with both knees straight. If this causes back discomfort, bend your left / right knee so your foot is flat on the floor. 2. Slowly slide your left / right heel back toward your buttocks until you feel a gentle stretch in the front of your knee or thigh. 3. Hold this position for __________ seconds. 4. Slowly slide your left / right heel back to the starting position. Repeat __________ times. Complete this exercise __________ times a day. Exercise C: Quadriceps, Prone  1. Lie on your abdomen on a firm surface, such as a bed or padded floor. 2. Bend your left / right knee and hold your ankle. If you cannot reach your ankle or pant leg, loop a belt around your foot and grab the belt instead. 3. Gently pull your heel toward your buttocks. Your knee should not slide out to the side. You should feel a stretch in the front of your thigh and knee. 4. Hold this position for __________ seconds. Repeat __________ times. Complete this stretch __________ times a day. Exercise D: Hamstring, Supine 1. Lie on your back. 2. Loop a belt or towel over the ball of your left / right foot. The ball of your foot is on the walking surface, right under your toes. 3. Straighten your left / right knee and slowly pull on the belt to raise your leg until you feel a  gentle stretch behind your knee. ? Do not let your left / right knee bend while you do this. ? Keep your other leg flat on the floor. 4. Hold this position for __________ seconds. Repeat __________ times. Complete this stretch __________ times a day. STRENGTHENING EXERCISES These exercises build strength and endurance in your knee. Endurance is the ability to use your muscles for a long time, even after they get tired. Exercise E: Quadriceps, Isometric  1. Lie on  your back with your left / right leg extended and your other knee bent. Put a rolled towel or small pillow under your knee if told by your health care provider. 2. Slowly tense the muscles in the front of your left / right thigh. You should see your kneecap slide up toward your hip or see increased dimpling just above the knee. This motion will push the back of the knee toward the floor. 3. For __________ seconds, keep the muscle as tight as you can without increasing your pain. 4. Relax the muscles slowly and completely. Repeat __________ times. Complete this exercise __________ times a day. Exercise F: Straight Leg Raises - Quadriceps 1. Lie on your back with your left / right leg extended and your other knee bent. 2. Tense the muscles in the front of your left / right thigh. You should see your kneecap slide up or see increased dimpling just above the knee. Your thigh may even shake a bit. 3. Keep these muscles tight as you raise your leg 4-6 inches (10-15 cm) off the floor. Do not let your knee bend. 4. Hold this position for __________ seconds. 5. Keep these muscles tense as you lower your leg. 6. Relax your muscles slowly and completely after each repetition. Repeat __________ times. Complete this exercise __________ times a day. Exercise G: Hamstring, Isometric 1. Lie on your back on a firm surface. 2. Bend your left / right knee approximately __________ degrees. 3. Dig your left / right heel into the surface as if you are trying to pull it toward your buttocks. Tighten the muscles in the back of your thighs to dig as hard as you can without increasing any pain. 4. Hold this position for __________ seconds. 5. Release the tension gradually and allow your muscles to relax completely for __________ seconds after each repetition. Repeat __________ times. Complete this exercise __________ times a day. Exercise H: Hamstring Curls  If told by your health care provider, do this exercise while  wearing ankle weights. Begin with __________ weights. Then increase the weight by 1 lb (0.5 kg) increments. Do not wear ankle weights that are more than __________. 1. Lie on your abdomen with your legs straight. 2. Bend your left / right knee as far as you can without feeling pain. Keep your hips flat against the floor. 3. Hold this position for __________ seconds. 4. Slowly lower your leg to the starting position.  Repeat __________ times. Complete this exercise __________ times a day. Exercise I: Squats (Quadriceps) 1. Stand in front of a table, with your feet and knees pointing straight ahead. You may rest your hands on the table for balance but not for support. 2. Slowly bend your knees and lower your hips like you are going to sit in a chair. ? Keep your weight over your heels, not over your toes. ? Keep your lower legs upright so they are parallel with the table legs. ? Do not let your hips go lower than your knees. ? Do not  bend lower than told by your health care provider. ? If your knee pain increases, do not bend as low. 3. Hold the squat position for __________ seconds. 4. Slowly push with your legs to return to standing. Do not use your hands to pull yourself to standing. Repeat __________ times. Complete this exercise __________ times a day. Exercise J: Wall Slides (Quadriceps)  1. Lean your back against a smooth wall or door while you walk your feet out 18-24 inches (46-61 cm) from it. 2. Place your feet hip-width apart. 3. Slowly slide down the wall or door until your knees bend __________ degrees. Keep your knees over your heels, not over your toes. Keep your knees in line with your hips. 4. Hold for __________ seconds. Repeat __________ times. Complete this exercise __________ times a day. Exercise K: Straight Leg Raises - Hip Abductors 1. Lie on your side with your left / right leg in the top position. Lie so your head, shoulder, knee, and hip line up. You may bend your  bottom knee to help you keep your balance. 2. Roll your hips slightly forward so your hips are stacked directly over each other and your left / right knee is facing forward. 3. Leading with your heel, lift your top leg 4-6 inches (10-15 cm). You should feel the muscles in your outer hip lifting. ? Do not let your foot drift forward. ? Do not let your knee roll toward the ceiling. 4. Hold this position for __________ seconds. 5. Slowly return your leg to the starting position. 6. Let your muscles relax completely after each repetition. Repeat __________ times. Complete this exercise __________ times a day. Exercise L: Straight Leg Raises - Hip Extensors 1. Lie on your abdomen on a firm surface. You can put a pillow under your hips if that is more comfortable. 2. Tense the muscles in your buttocks and lift your left / right leg about 4-6 inches (10-15 cm). Keep your knee straight as you lift your leg. 3. Hold this position for __________ seconds. 4. Slowly lower your leg to the starting position. 5. Let your leg relax completely after each repetition. Repeat __________ times. Complete this exercise __________ times a day. This information is not intended to replace advice given to you by your health care provider. Make sure you discuss any questions you have with your health care provider. Document Released: 07/21/2005 Document Revised: 05/31/2016 Document Reviewed: 07/13/2015 Elsevier Interactive Patient Education  2018 [employer].

## 2017-06-13 ENCOUNTER — Ambulatory Visit (INDEPENDENT_AMBULATORY_CARE_PROVIDER_SITE_OTHER): Payer: BLUE CROSS/BLUE SHIELD | Admitting: *Deleted

## 2017-06-13 DIAGNOSIS — I442 Atrioventricular block, complete: Secondary | ICD-10-CM

## 2017-06-13 NOTE — Progress Notes (Signed)
Remote pacemaker transmission.   

## 2017-06-15 ENCOUNTER — Encounter: Payer: Self-pay | Admitting: Cardiology

## 2017-06-15 DIAGNOSIS — H524 Presbyopia: Secondary | ICD-10-CM | POA: Diagnosis not present

## 2017-06-16 LAB — CUP PACEART REMOTE DEVICE CHECK
Battery Remaining Longevity: 122 mo
Battery Remaining Percentage: 95.5 %
Battery Voltage: 2.99 V
Brady Statistic AP VP Percent: 1 %
Brady Statistic AP VS Percent: 0 %
Brady Statistic AS VP Percent: 99 %
Brady Statistic AS VS Percent: 1 %
Brady Statistic RA Percent Paced: 1 %
Brady Statistic RV Percent Paced: 99 %
Date Time Interrogation Session: 20180924060024
Implantable Lead Implant Date: 20170608
Implantable Lead Implant Date: 20170608
Implantable Lead Location: 753859
Implantable Lead Location: 753860
Implantable Pulse Generator Implant Date: 20170608
Lead Channel Impedance Value: 410 Ohm
Lead Channel Impedance Value: 560 Ohm
Lead Channel Pacing Threshold Amplitude: 0.75 V
Lead Channel Pacing Threshold Amplitude: 0.75 V
Lead Channel Pacing Threshold Pulse Width: 0.4 ms
Lead Channel Pacing Threshold Pulse Width: 0.4 ms
Lead Channel Sensing Intrinsic Amplitude: 12 mV
Lead Channel Sensing Intrinsic Amplitude: 3.2 mV
Lead Channel Setting Pacing Amplitude: 1 V
Lead Channel Setting Pacing Amplitude: 2 V
Lead Channel Setting Pacing Pulse Width: 0.4 ms
Lead Channel Setting Sensing Sensitivity: 2.5 mV
Pulse Gen Model: 2272
Pulse Gen Serial Number: 7903881

## 2017-06-17 ENCOUNTER — Other Ambulatory Visit: Payer: Self-pay | Admitting: Internal Medicine

## 2017-08-06 DIAGNOSIS — Z23 Encounter for immunization: Secondary | ICD-10-CM | POA: Diagnosis not present

## 2017-09-08 ENCOUNTER — Ambulatory Visit: Payer: BLUE CROSS/BLUE SHIELD | Admitting: Cardiology

## 2017-09-08 ENCOUNTER — Encounter: Payer: Self-pay | Admitting: Cardiology

## 2017-09-08 VITALS — BP 138/118 | HR 95 | Ht 63.0 in | Wt 204.6 lb

## 2017-09-08 DIAGNOSIS — Z45018 Encounter for adjustment and management of other part of cardiac pacemaker: Secondary | ICD-10-CM

## 2017-09-08 DIAGNOSIS — I10 Essential (primary) hypertension: Secondary | ICD-10-CM

## 2017-09-08 DIAGNOSIS — I442 Atrioventricular block, complete: Secondary | ICD-10-CM | POA: Diagnosis not present

## 2017-09-08 DIAGNOSIS — I428 Other cardiomyopathies: Secondary | ICD-10-CM

## 2017-09-08 LAB — CUP PACEART INCLINIC DEVICE CHECK
Battery Remaining Longevity: 111 mo
Battery Voltage: 2.99 V
Brady Statistic RA Percent Paced: 0.09 %
Brady Statistic RV Percent Paced: 99.99 %
Date Time Interrogation Session: 20181220161009
Implantable Lead Implant Date: 20170608
Implantable Lead Implant Date: 20170608
Implantable Lead Location: 753859
Implantable Lead Location: 753860
Implantable Pulse Generator Implant Date: 20170608
Lead Channel Impedance Value: 412.5 Ohm
Lead Channel Impedance Value: 587.5 Ohm
Lead Channel Pacing Threshold Amplitude: 0.625 V
Lead Channel Pacing Threshold Amplitude: 0.75 V
Lead Channel Pacing Threshold Amplitude: 0.75 V
Lead Channel Pacing Threshold Pulse Width: 0.4 ms
Lead Channel Pacing Threshold Pulse Width: 0.4 ms
Lead Channel Pacing Threshold Pulse Width: 0.4 ms
Lead Channel Sensing Intrinsic Amplitude: 3.9 mV
Lead Channel Setting Pacing Amplitude: 2 V
Lead Channel Setting Pacing Amplitude: 2 V
Lead Channel Setting Pacing Pulse Width: 0.4 ms
Lead Channel Setting Sensing Sensitivity: 2.5 mV
Pulse Gen Model: 2272
Pulse Gen Serial Number: 7903881

## 2017-09-08 NOTE — Progress Notes (Signed)
Electrophysiology Office Note   Date:  09/08/2017   ID:  Anne Rush, DOB 09-03-1961, MRN [id number]  PCP:  Sherlene Shamsullo, Teresa L, MD  Primary Electrophysiologist:  Regan LemmingWill Martin Rikita Grabert, MD    Chief Complaint  Patient presents with  . Pacemaker Check    Complete heart block/Nonischemic cardiomyopathy     History of Present Illness: Anne Rush is a 56 y.o. female who presents today for electrophysiology evaluation.   She presented to the hospital in complete heart block and had a St. Jude dual-chamber pacemaker placed on 02/26/16.   Today, denies symptoms of palpitations, chest pain, shortness of breath, orthopnea, PND, lower extremity edema, claudication, dizziness, presyncope, syncope, bleeding, or neurologic sequela. The patient is tolerating medications without difficulties.  Feeling well today without major complaint.   Past Medical History:  Diagnosis Date  . Anxiety   . Hyperlipidemia   . Hypertension   . Interstitial lung disease (HCC)   . Pollen allergies   . Squamous cell carcinoma 2014   skin cancer  . Subclinical hyperthyroidism 06/17/2014   Past Surgical History:  Procedure Laterality Date  . COSMETIC SURGERY    . EP IMPLANTABLE DEVICE N/A 02/26/2016   Procedure: Pacemaker Implant;  Surgeon: Kenyata Guess Jorja LoaMartin Bonny Egger, MD;  Location: [facility];  Service: Cardiovascular;  Laterality: N/A;  . TUBAL LIGATION       Current Outpatient Medications  Medication Sig Dispense Refill  . carvedilol (COREG) 25 MG tablet Take 1 tablet (25 mg total) by mouth 2 (two) times daily with a meal. 180 tablet 3  . diazepam (VALIUM) 5 MG tablet Take 1 tablet (5 mg total) by mouth daily as needed for anxiety. 30 tablet 5  . hydrochlorothiazide (HYDRODIURIL) 25 MG tablet TAKE ONE TABLET BY MOUTH DAILY 30 tablet 5  . nortriptyline (PAMELOR) 10 MG capsule Take 20 mg by mouth at bedtime.      No current facility-administered medications for this visit.     Allergies:   Codeine;  Oxycodone-acetaminophen; Paroxetine; Sulfonamide derivatives; and Lisinopril   Social History:  The patient  reports that she has quit smoking. she has never used smokeless tobacco. She reports that she does not drink alcohol or use drugs.   Family History:  The patient's family history includes Anxiety disorder in her mother; Cancer in her maternal aunt; Heart failure in her maternal grandfather; Hypertension in her mother.   ROS:  Please see the history of present illness.   Otherwise, review of systems is positive for palpitations, anxiety.   All other systems are reviewed and negative.   PHYSICAL EXAM: VS:  BP (!) 138/118   Pulse 95   Ht 5\' 3"  (1.6 m)   Wt 204 lb 9.6 oz (92.8 kg)   SpO2 96%   BMI 36.24 kg/m  , BMI Body mass index is 36.24 kg/m. GEN: Well nourished, well developed, in no acute distress  HEENT: normal  Neck: no JVD, carotid bruits, or masses Cardiac: RRR; no murmurs, rubs, or gallops,no edema  Respiratory:  clear to auscultation bilaterally, normal work of breathing GI: soft, nontender, nondistended, + BS MS: no deformity or atrophy  Skin: warm and dry, device site well healed Neuro:  Strength and sensation are intact Psych: euthymic mood, full affect  EKG:  EKG is not ordered today. Personal review of the ekg ordered 03/14/17 shows A sense, V pace  Personal review of the device interrogation today. Results in [facility]   Recent Labs: 12/13/2016: ALT 16;  BUN 10; Creatinine, Ser 0.61; Potassium 3.9; Sodium 138; TSH 2.40    Lipid Panel     Component Value Date/Time   CHOL 275 (H) 12/13/2016 1145   TRIG 320.0 (H) 12/13/2016 1145   HDL 49.30 12/13/2016 1145   CHOLHDL 6 12/13/2016 1145   VLDL 64.0 (H) 12/13/2016 1145   LDLCALC 149 (H) 05/12/2015 0901   LDLDIRECT 178.0 12/13/2016 1145     Wt Readings from Last 3 Encounters:  09/08/17 204 lb 9.6 oz (92.8 kg)  05/17/17 200 lb 6.4 oz (90.9 kg)  03/14/17 199 lb 12.8 oz (90.6 kg)      Other studies  Reviewed: Additional studies/ records that were reviewed today include: TTE 03/29/17 Review of the above records today demonstrates:  - Left ventricle: The cavity size was normal. Wall thickness was   increased in a pattern of mild LVH. Systolic function was normal.   The estimated ejection fraction was in the range of 50% to 55%.   Apical septal hypokinesis. Doppler parameters are consistent with   abnormal left ventricular relaxation (grade 1 diastolic   dysfunction). The E/e&' ratio is between 8-15, suggesting   indeterminate LV filling pressure. - Left atrium: The atrium was normal in size. - Tricuspid valve: There was mild regurgitation. - Pulmonary arteries: PA peak pressure: 27 mm Hg (S). - Inferior vena cava: The vessel was normal in size. The   respirophasic diameter changes were in the normal range (>= 50%),   consistent with normal central venous pressure.   ASSESSMENT AND PLAN:  1.  Complete AV block: Saint Jude dual-chamber pacemaker implanted 02/26/16.  Lites functioning appropriately.  It turned on her office she is symptomatic with this.  2. Hypertension: Blood pressure is elevated today.  Her diastolic is quite elevated and has been in the high 80s-90s at home.  She does not wish to start medications at this time.  We Emmily Pellegrin hold off on further therapy.  3. Mild systolic heart failure, chronic: She is on carvedilol 25 mg.  She is feeling well.  Her blood pressure is better controlled.  No changes.  Current medicines are reviewed at length with the patient today.   The patient does not have concerns regarding her medicines.  The following changes were made today:  none  Labs/ tests ordered today include:  No orders of the defined types were placed in this encounter.    Disposition:   FU with Fredderick Swanger 12 months  Signed, Kimble Delaurentis Jorja Loa, MD  09/08/2017 11:26 AM     [facility] [address] [place] [address] [phone]  (office) [phone] (fax)

## 2017-09-08 NOTE — Patient Instructions (Addendum)
Medication Instructions:  Your physician recommends that you continue on your current medications as directed. Please refer to the Current Medication list given to you today.  *If you need a refill on your cardiac medications before your next appointment, please call your pharmacy*  Labwork: None ordered  Testing/Procedures: None ordered  Follow-Up: Remote monitoring is used to monitor your Pacemaker or ICD from home. This monitoring reduces the number of office visits required to check your device to one time per year. It allows [place] to keep an eye on the functioning of your device to ensure it is working properly. You are scheduled for a device check from home on 09/14/2017. You may send your transmission at any time that day. If you have a wireless device, the transmission will be sent automatically. After your physician reviews your transmission, you will receive a postcard with your next transmission date.  Your physician wants you to follow-up in: 1 year with Dr. Elberta Fortis.  You will receive a reminder letter in the mail two months in advance. If you don't receive a letter, please call our office to schedule the follow-up appointment.  Thank you for choosing [facility]!!   Dory Horn, RN [phone]

## 2017-09-14 ENCOUNTER — Ambulatory Visit (INDEPENDENT_AMBULATORY_CARE_PROVIDER_SITE_OTHER): Payer: BLUE CROSS/BLUE SHIELD | Admitting: *Deleted

## 2017-09-14 DIAGNOSIS — I442 Atrioventricular block, complete: Secondary | ICD-10-CM

## 2017-09-14 NOTE — Progress Notes (Signed)
Remote pacemaker transmission.   

## 2017-09-15 ENCOUNTER — Encounter: Payer: Self-pay | Admitting: Cardiology

## 2017-09-26 LAB — CUP PACEART REMOTE DEVICE CHECK
Battery Remaining Longevity: 112 mo
Battery Remaining Percentage: 95.5 %
Battery Voltage: 2.98 V
Brady Statistic AP VP Percent: 1 %
Brady Statistic AP VS Percent: 0 %
Brady Statistic AS VP Percent: 99 %
Brady Statistic AS VS Percent: 1 %
Brady Statistic RA Percent Paced: 1 %
Brady Statistic RV Percent Paced: 99 %
Date Time Interrogation Session: 20181226105051
Implantable Lead Implant Date: 20170608
Implantable Lead Implant Date: 20170608
Implantable Lead Location: 753859
Implantable Lead Location: 753860
Implantable Pulse Generator Implant Date: 20170608
Lead Channel Impedance Value: 400 Ohm
Lead Channel Impedance Value: 580 Ohm
Lead Channel Pacing Threshold Amplitude: 0.625 V
Lead Channel Pacing Threshold Amplitude: 0.75 V
Lead Channel Pacing Threshold Pulse Width: 0.4 ms
Lead Channel Pacing Threshold Pulse Width: 0.4 ms
Lead Channel Sensing Intrinsic Amplitude: 1 mV
Lead Channel Sensing Intrinsic Amplitude: 12 mV
Lead Channel Setting Pacing Amplitude: 2 V
Lead Channel Setting Pacing Amplitude: 2 V
Lead Channel Setting Pacing Pulse Width: 0.4 ms
Lead Channel Setting Sensing Sensitivity: 2.5 mV
Pulse Gen Model: 2272
Pulse Gen Serial Number: 7903881

## 2017-10-06 ENCOUNTER — Ambulatory Visit: Payer: BLUE CROSS/BLUE SHIELD | Admitting: Family Medicine

## 2017-10-06 DIAGNOSIS — J019 Acute sinusitis, unspecified: Secondary | ICD-10-CM | POA: Diagnosis not present

## 2017-10-10 ENCOUNTER — Encounter: Payer: Self-pay | Admitting: Internal Medicine

## 2017-10-10 ENCOUNTER — Other Ambulatory Visit: Payer: Self-pay | Admitting: Internal Medicine

## 2017-10-10 MED ORDER — NORTRIPTYLINE HCL 10 MG PO CAPS: 20.0000 mg | ORAL_CAPSULE | Freq: Every day | ORAL | 2 refills | Status: DC

## 2017-10-10 NOTE — Progress Notes (Signed)
Medication refilled

## 2017-12-13 ENCOUNTER — Other Ambulatory Visit: Payer: Self-pay | Admitting: Internal Medicine

## 2017-12-14 ENCOUNTER — Ambulatory Visit (INDEPENDENT_AMBULATORY_CARE_PROVIDER_SITE_OTHER): Payer: BLUE CROSS/BLUE SHIELD | Admitting: *Deleted

## 2017-12-14 DIAGNOSIS — I442 Atrioventricular block, complete: Secondary | ICD-10-CM

## 2017-12-14 NOTE — Progress Notes (Signed)
Remote pacemaker transmission.   

## 2017-12-19 ENCOUNTER — Other Ambulatory Visit: Payer: Self-pay

## 2017-12-19 ENCOUNTER — Encounter: Payer: Self-pay | Admitting: Internal Medicine

## 2017-12-19 ENCOUNTER — Ambulatory Visit (INDEPENDENT_AMBULATORY_CARE_PROVIDER_SITE_OTHER): Payer: BLUE CROSS/BLUE SHIELD

## 2017-12-19 ENCOUNTER — Encounter: Payer: Self-pay | Admitting: Cardiology

## 2017-12-19 ENCOUNTER — Ambulatory Visit (INDEPENDENT_AMBULATORY_CARE_PROVIDER_SITE_OTHER): Payer: BLUE CROSS/BLUE SHIELD | Admitting: Internal Medicine

## 2017-12-19 VITALS — BP 122/70 | HR 86 | Temp 98.1°F | Wt 203.9 lb

## 2017-12-19 DIAGNOSIS — M7989 Other specified soft tissue disorders: Secondary | ICD-10-CM | POA: Diagnosis not present

## 2017-12-19 DIAGNOSIS — I10 Essential (primary) hypertension: Secondary | ICD-10-CM | POA: Diagnosis not present

## 2017-12-19 DIAGNOSIS — G8929 Other chronic pain: Secondary | ICD-10-CM

## 2017-12-19 DIAGNOSIS — M25562 Pain in left knee: Secondary | ICD-10-CM | POA: Diagnosis not present

## 2017-12-19 DIAGNOSIS — E559 Vitamin D deficiency, unspecified: Secondary | ICD-10-CM | POA: Diagnosis not present

## 2017-12-19 DIAGNOSIS — J8410 Pulmonary fibrosis, unspecified: Secondary | ICD-10-CM

## 2017-12-19 DIAGNOSIS — R7301 Impaired fasting glucose: Secondary | ICD-10-CM

## 2017-12-19 DIAGNOSIS — Z1231 Encounter for screening mammogram for malignant neoplasm of breast: Secondary | ICD-10-CM | POA: Diagnosis not present

## 2017-12-19 DIAGNOSIS — Z1239 Encounter for other screening for malignant neoplasm of breast: Secondary | ICD-10-CM

## 2017-12-19 DIAGNOSIS — F411 Generalized anxiety disorder: Secondary | ICD-10-CM | POA: Diagnosis not present

## 2017-12-19 DIAGNOSIS — E669 Obesity, unspecified: Secondary | ICD-10-CM

## 2017-12-19 DIAGNOSIS — R5383 Other fatigue: Secondary | ICD-10-CM

## 2017-12-19 DIAGNOSIS — Z124 Encounter for screening for malignant neoplasm of cervix: Secondary | ICD-10-CM

## 2017-12-19 DIAGNOSIS — E785 Hyperlipidemia, unspecified: Secondary | ICD-10-CM

## 2017-12-19 DIAGNOSIS — E782 Mixed hyperlipidemia: Secondary | ICD-10-CM | POA: Diagnosis not present

## 2017-12-19 DIAGNOSIS — D582 Other hemoglobinopathies: Secondary | ICD-10-CM | POA: Diagnosis not present

## 2017-12-19 MED ORDER — NORTRIPTYLINE HCL 10 MG PO CAPS
20.0000 mg | ORAL_CAPSULE | Freq: Every day | ORAL | 3 refills | Status: DC
Start: 2017-12-19 — End: 2018-08-23

## 2017-12-19 MED ORDER — DIAZEPAM 5 MG PO TABS: 5.0000 mg | ORAL_TABLET | Freq: Every day | ORAL | 5 refills | Status: DC | PRN

## 2017-12-19 MED ORDER — NORTRIPTYLINE HCL 10 MG PO CAPS: 20.0000 mg | ORAL_CAPSULE | Freq: Every day | ORAL | 2 refills | Status: DC

## 2017-12-19 NOTE — Patient Instructions (Signed)
Plain films of the left knee today  Return for fasting labs ASAP  I encourage you to start a walking program to maintain healthy toned muscles and good lung function

## 2017-12-19 NOTE — Progress Notes (Signed)
Letter  

## 2017-12-19 NOTE — Progress Notes (Signed)
Patient ID: Anne Rush, female    DOB: July 05, 1961  Age: 57 y.o. MRN: [id number]  The patient is here for  Her annual non gyn preventive amination and management of other chronic and acute problems.   Last seen one year ago,  Last gyn exam unknown (provider records not available, patient cannot remember) Last PAP was by [place]  May have  [place] in   2015? No recent breast cancer screening by choice.  Has been deferring mammogram  Since 2015 due to pacemaker implant and fear  Of wire displacement .  Not doing  Self breast exams.  FH notable for  Maternal aunt has BRCA . Refuses colonoscopy due to fear of procedure  Discussed cologuard    The risk factors are reflected in the social history.  The roster of all physicians providing medical care to patient - is listed in the Snapshot section of the chart.  Activities of daily living:  The patient is 100% independent in all ADLs: dressing, toileting, feeding as well as independent mobility  Home safety : The patient has smoke detectors in the home. They wear seatbelts.  There are no firearms at home. There is no violence in the home.   There is no risks for hepatitis, STDs or HIV. There is no   history of blood transfusion. They have no travel history to infectious disease endemic areas of the world.  The patient has seen their dentist in the last six month. They have seen their eye doctor in the last year.   They do not  have excessive sun exposure. She does not spend any time outdoors, but reviewed the need for sun protection: hats, long sleeves and use of sunscreen if there is significant sun exposure.   Diet: the importance of a healthy diet is discussed. They do have a healthy diet.  The benefits of regular aerobic exercise were discussed. She has stopped walking  due to low energy,  Low stamina and recurrent left knee swelling and pain that recurs whenever she tries to walk for exercise on a treadmill.  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  Depression screen: there are no signs or vegative symptoms of depression- irritability, change in appetite, anhedonia, sadness/tearfullness.  Uses valium prn for recurrent episodes of anxiety   The following portions of the patient's history were reviewed and updated as appropriate: allergies, current medications, past family history, past medical history,  past surgical history, past social history  and problem list.  Visual acuity was not assessed per patient preference since she has regular follow up with her ophthalmologist. Hearing and body mass index were assessed and reviewed.   During the course of the visit the patient was educated and counseled about appropriate screening and preventive services including : fall prevention , diabetes screening, nutrition counseling, colorectal cancer screening, and recommended immunizations.    CC: The primary encounter diagnosis was Chronic pain of left knee. Diagnoses of Mixed hyperlipidemia, Impaired fasting glucose, Vitamin D deficiency, Fatigue, unspecified type, Postinflammatory pulmonary fibrosis (HCC), Elevated hemoglobin ([place]), Hyperlipidemia LDL goal <100, Essential hypertension, GAD (generalized anxiety disorder), Obesity (BMI 30-39.9), Breast cancer screening, and Cervical cancer screening were also pertinent to this visit.  Obesity:  Her weight is up 7 lbs  Body mass index is 36.12 kg/m. Has not exercise d since she received her Pacemaker 2017 for [facility].  Was never referred for cardiopulmonary rehab    Has not seen [facility]  Pulmonology for follow up pulmonary fibrosis since  June 2016.  FEV  Had improved to > 90% with smoking cessation. Advised to lose weight and start exercising.    Used to see neurology Potter for chronic headaches  Last OV 2 10/2015 Labs reviewed from 2018: Hyperlipidemia,  Low vitamin D  LDL 178  Trig 320    History [place] has a past medical history of Anxiety, Hyperlipidemia, Hypertension,  Interstitial lung disease ([place]), Pollen allergies, Squamous cell carcinoma (2014), and Subclinical hyperthyroidism (06/17/2014).   She has a past surgical history that includes Cosmetic surgery; Tubal ligation; and Cardiac catheterization (N/A, 02/26/2016).   Her family history includes Anxiety disorder in her mother; Cancer in her maternal aunt; Heart failure in her maternal grandfather; Hypertension in her mother.She reports that she has quit smoking. She has never used smokeless tobacco. She reports that she does not drink alcohol or use drugs.  Outpatient Medications Prior to Visit  Medication Sig Dispense Refill  . carvedilol (COREG) 25 MG tablet Take 1 tablet (25 mg total) by mouth 2 (two) times daily with a meal. 180 tablet 3  . hydrochlorothiazide (HYDRODIURIL) 25 MG tablet TAKE ONE TABLET BY MOUTH DAILY 90 tablet 1  . diazepam (VALIUM) 5 MG tablet Take 1 tablet (5 mg total) by mouth daily as needed for anxiety. 30 tablet 5  . nortriptyline (PAMELOR) 10 MG capsule Take 2 capsules (20 mg total) by mouth at bedtime. 60 capsule 2   No facility-administered medications prior to visit.     Review of Systems  Patient denies headache, fevers, malaise, unintentional weight loss, skin rash, eye pain, sinus congestion and sinus pain, sore throat, dysphagia,  hemoptysis , cough,wheezing, chest pain, palpitations, orthopnea, edema, abdominal pain, nausea, melena, diarrhea, constipation, flank pain, dysuria, hematuria, urinary  Frequency, nocturia, numbness, tingling, seizures,  Focal weakness, Loss of consciousness,  Tremor, insomnia, depression, and suicidal ideation.     Objective:  BP 122/70 (BP Location: Left Arm, Patient Position: Sitting, Cuff Size: Normal)   Pulse 86   Temp 98.1 F (36.7 C)   Wt 203 lb 14.4 oz (92.5 kg)   SpO2 98%   BMI 36.12 kg/m   Physical Exam   General appearance: alert, cooperative and appears stated age Ears: normal TM's and external ear canals both  ears Throat: lips, mucosa, and tongue normal; teeth and gums normal Neck: no adenopathy, no carotid bruit, supple, symmetrical, trachea midline and thyroid not enlarged, symmetric, no tenderness/mass/nodules Back: symmetric, no curvature. ROM normal. No CVA tenderness. Lungs: clear to auscultation bilaterally Heart: regular rate and rhythm, S1, S2 normal, no murmur, click, rub or gallop Abdomen: soft, non-tender; bowel sounds normal; no masses,  no organomegaly Pulses: 2+ and symmetric Skin: Skin color, texture, turgor normal. No rashes or lesions Lymph nodes: Cervical, supraclavicular, and axillary nodes normal.   Assessment & Plan:   Problem List Items Addressed This Visit    Postinflammatory pulmonary fibrosis ([place])    FEV improved to 90% of expected  after smoking cessation  by last [facility] note with PFTS in 2016.  She has maintained tobacco abstinence but has become very sedentary and cites multiple barriers to lifestyle changes,  Mostly due to deconditioning resulting in exertional dyspnea and fatigue. She denies wheezing unless she walks in cold air.  Encouraged to start some form of exercise on a daily basis  Vs accept referral to cardiopulmonary rehab.       Obesity (BMI 30-39.9)    Strongly encouraged to  change lifestyle with daily exercise as goal.  I have addressed  BMI and recommended wt loss of 10% of body weight (20 lbs)  over the next 6 months using a low glycemic index diet and regular exercise a minimum of 5 days per week.        Hyperlipidemia LDL goal <100    Untreated due to fear of statins.  Trigs > 300.  Mediterranean diet and exercise were advised and repeat is due   Lab Results  Component Value Date   CHOL 275 (H) 12/13/2016   HDL 49.30 12/13/2016   LDLCALC 149 (H) 05/12/2015   LDLDIRECT 178.0 12/13/2016   TRIG 320.0 (H) 12/13/2016   CHOLHDL 6 12/13/2016         GAD (generalized anxiety disorder)    Managed with prn valium and qhs pamelor. The  risks and benefits of benzodiazepine use were reviewed with patient today including excessive sedation leading to respiratory depression,  impaired thinking/driving, and addiction.  Patient was advised to avoid concurrent use with alcohol, to use medication only as needed and not to share with others  .       Relevant Medications   diazepam (VALIUM) 5 MG tablet   nortriptyline (PAMELOR) 10 MG capsule   Essential hypertension    Well controlled on current regimen. Renal function is overdue , no changes today.      Elevated hemoglobin ([place])    Noted in 2017, with iron stores normal in 2018. Likely due to history of tobacco abuse   Repeating today   Lab Results  Component Value Date   IRON 128 12/13/2016   FERRITIN 139.9 12/13/2016   Lab Results  Component Value Date   WBC 8.2 03/16/2016   HGB 15.4 (H) 03/16/2016   HCT 44.6 03/16/2016   MCV 89.5 03/16/2016   PLT 271.0 03/16/2016         Chronic pain of left knee - Primary    No history of prior injury.  Per patient knee swells and becomes painful on medial side when she walks,  So consequently she avoids exercise,  Plain films today show Minimal spur along the anterior superior patella, likely representing distal quadriceps tendinosis. No fracture or joint effusion.  Encouraged to resume an outside walking program or an alternative exercise (bicycle, water aerobics)       Relevant Medications   nortriptyline (PAMELOR) 10 MG capsule   Other Relevant Orders   DG Knee Complete 4 Views Left (Completed)   Cervical cancer screening    Patient was offered the option to return here for annual well woman's exam with breast and cervical ca screening, but encouraged to resume screening this year by ANYONE       Breast cancer screening    She has avoided mammography since implantation of pacemaker (last mammogram 2015) Maternal aunt had BRCA.  Encouraged to discuss her fears of pacemaker injury with cardiologist       Other Visit  Diagnoses    Mixed hyperlipidemia       Relevant Orders   Lipid panel   Impaired fasting glucose       Relevant Orders   Hemoglobin A1c   Comprehensive metabolic panel   Vitamin D deficiency       Relevant Orders   VITAMIN D 25 Hydroxy (Vit-D Deficiency, Fractures)   Fatigue, unspecified type       Relevant Orders   TSH   CBC with Differential/Platelet      I am  having Nat Math. Erkkila maintain her carvedilol, hydrochlorothiazide, diazepam, and nortriptyline.  Meds ordered this encounter  Medications  . diazepam (VALIUM) 5 MG tablet    Sig: Take 1 tablet (5 mg total) by mouth daily as needed for anxiety.    Dispense:  30 tablet    Refill:  5  . DISCONTD: nortriptyline (PAMELOR) 10 MG capsule    Sig: Take 2 capsules (20 mg total) by mouth at bedtime.    Dispense:  60 capsule    Refill:  2  . nortriptyline (PAMELOR) 10 MG capsule    Sig: Take 2 capsules (20 mg total) by mouth at bedtime.    Dispense:  180 capsule    Refill:  3  A total of 40 minutes was spent with patient more than half of which was spent in counseling patient on the above mentioned issues , reviewing and explaining recent labs and imaging studies done, and coordination of care.   Medications Discontinued During This Encounter  Medication Reason  . diazepam (VALIUM) 5 MG tablet Reorder  . nortriptyline (PAMELOR) 10 MG capsule Reorder  . nortriptyline (PAMELOR) 10 MG capsule Reorder    Follow-up: No follow-ups on file.   Crecencio Mc, MD

## 2017-12-20 ENCOUNTER — Encounter: Payer: Self-pay | Admitting: Internal Medicine

## 2017-12-20 DIAGNOSIS — G8929 Other chronic pain: Secondary | ICD-10-CM | POA: Insufficient documentation

## 2017-12-20 DIAGNOSIS — M25562 Pain in left knee: Principal | ICD-10-CM

## 2017-12-20 DIAGNOSIS — Z124 Encounter for screening for malignant neoplasm of cervix: Secondary | ICD-10-CM | POA: Insufficient documentation

## 2017-12-20 DIAGNOSIS — Z1239 Encounter for other screening for malignant neoplasm of breast: Secondary | ICD-10-CM | POA: Insufficient documentation

## 2017-12-20 NOTE — Assessment & Plan Note (Signed)
Untreated due to fear of statins.  Trigs > 300.  Mediterranean diet and exercise were advised and repeat is due   Lab Results  Component Value Date   CHOL 275 (H) 12/13/2016   HDL 49.30 12/13/2016   LDLCALC 149 (H) 05/12/2015   LDLDIRECT 178.0 12/13/2016   TRIG 320.0 (H) 12/13/2016   CHOLHDL 6 12/13/2016

## 2017-12-20 NOTE — Assessment & Plan Note (Signed)
Well controlled on current regimen. Renal function is overdue , no changes today. 

## 2017-12-20 NOTE — Assessment & Plan Note (Signed)
Noted in 2017, with iron stores normal in 2018. Likely due to history of tobacco abuse   Repeating today   Lab Results  Component Value Date   IRON 128 12/13/2016   FERRITIN 139.9 12/13/2016   Lab Results  Component Value Date   WBC 8.2 03/16/2016   HGB 15.4 (H) 03/16/2016   HCT 44.6 03/16/2016   MCV 89.5 03/16/2016   PLT 271.0 03/16/2016

## 2017-12-20 NOTE — Assessment & Plan Note (Signed)
Patient was offered the option to return here for annual well woman's exam with breast and cervical ca screening, but encouraged to resume screening this year by ANYONE

## 2017-12-20 NOTE — Assessment & Plan Note (Signed)
No history of prior injury.  Per patient knee swells and becomes painful on medial side when she walks,  So consequently she avoids exercise,  Plain films today show Minimal spur along the anterior superior patella, likely representing distal quadriceps tendinosis. No fracture or joint effusion.  Encouraged to resume an outside walking program or an alternative exercise (bicycle, water aerobics)

## 2017-12-20 NOTE — Assessment & Plan Note (Addendum)
FEV improved to 90% of expected  after smoking cessation  by last [facility] note with PFTS in 2016.  She has maintained tobacco abstinence but has become very sedentary and cites multiple barriers to lifestyle changes,  Mostly due to deconditioning resulting in exertional dyspnea and fatigue. She denies wheezing unless she walks in cold air.  Encouraged to start some form of exercise on a daily basis  Vs accept referral to cardiopulmonary rehab.

## 2017-12-20 NOTE — Assessment & Plan Note (Addendum)
Strongly encouraged to change lifestyle with daily exercise as goal.  I have addressed  BMI and recommended wt loss of 10% of body weight (20 lbs)  over the next 6 months using a low glycemic index diet and regular exercise a minimum of 5 days per week.

## 2017-12-20 NOTE — Assessment & Plan Note (Signed)
Managed with prn valium and qhs pamelor. The risks and benefits of benzodiazepine use were reviewed with patient today including excessive sedation leading to respiratory depression,  impaired thinking/driving, and addiction.  Patient was advised to avoid concurrent use with alcohol, to use medication only as needed and not to share with others  .

## 2017-12-20 NOTE — Assessment & Plan Note (Signed)
She has avoided mammography since implantation of pacemaker (last mammogram 2015) Maternal aunt had BRCA.  Encouraged to discuss her fears of pacemaker injury with cardiologist

## 2017-12-24 LAB — CUP PACEART REMOTE DEVICE CHECK
Battery Remaining Longevity: 115 mo
Battery Remaining Percentage: 95.5 %
Battery Voltage: 2.99 V
Brady Statistic AP VP Percent: 1 %
Brady Statistic AP VS Percent: 0 %
Brady Statistic AS VP Percent: 99 %
Brady Statistic AS VS Percent: 1 %
Brady Statistic RA Percent Paced: 1 %
Brady Statistic RV Percent Paced: 99 %
Date Time Interrogation Session: 20190326205100
Implantable Lead Implant Date: 20170608
Implantable Lead Implant Date: 20170608
Implantable Lead Location: 753859
Implantable Lead Location: 753860
Implantable Pulse Generator Implant Date: 20170608
Lead Channel Impedance Value: 410 Ohm
Lead Channel Impedance Value: 600 Ohm
Lead Channel Pacing Threshold Amplitude: 0.625 V
Lead Channel Pacing Threshold Amplitude: 0.75 V
Lead Channel Pacing Threshold Pulse Width: 0.4 ms
Lead Channel Pacing Threshold Pulse Width: 0.4 ms
Lead Channel Sensing Intrinsic Amplitude: 12 mV
Lead Channel Sensing Intrinsic Amplitude: 4 mV
Lead Channel Setting Pacing Amplitude: 2 V
Lead Channel Setting Pacing Amplitude: 2 V
Lead Channel Setting Pacing Pulse Width: 0.4 ms
Lead Channel Setting Sensing Sensitivity: 2.5 mV
Pulse Gen Model: 2272
Pulse Gen Serial Number: 7903881

## 2017-12-30 DIAGNOSIS — H0015 Chalazion left lower eyelid: Secondary | ICD-10-CM | POA: Diagnosis not present

## 2018-01-09 ENCOUNTER — Other Ambulatory Visit (INDEPENDENT_AMBULATORY_CARE_PROVIDER_SITE_OTHER): Payer: BLUE CROSS/BLUE SHIELD

## 2018-01-09 DIAGNOSIS — R7301 Impaired fasting glucose: Secondary | ICD-10-CM | POA: Diagnosis not present

## 2018-01-09 DIAGNOSIS — R5383 Other fatigue: Secondary | ICD-10-CM | POA: Diagnosis not present

## 2018-01-09 DIAGNOSIS — E782 Mixed hyperlipidemia: Secondary | ICD-10-CM | POA: Diagnosis not present

## 2018-01-09 DIAGNOSIS — E559 Vitamin D deficiency, unspecified: Secondary | ICD-10-CM

## 2018-01-09 LAB — COMPREHENSIVE METABOLIC PANEL
ALT: 19 U/L (ref 0–35)
AST: 17 U/L (ref 0–37)
Albumin: 4.6 g/dL (ref 3.5–5.2)
Alkaline Phosphatase: 45 U/L (ref 39–117)
BUN: 11 mg/dL (ref 6–23)
CO2: 28 mEq/L (ref 19–32)
Calcium: 9.6 mg/dL (ref 8.4–10.5)
Chloride: 97 mEq/L (ref 96–112)
Creatinine, Ser: 0.62 mg/dL (ref 0.40–1.20)
GFR: 106.19 mL/min (ref >60.00)
Glucose, Bld: 93 mg/dL (ref 70–99)
Potassium: 4.1 mEq/L (ref 3.5–5.1)
Sodium: 138 mEq/L (ref 135–145)
Total Bilirubin: 0.5 mg/dL (ref 0.2–1.2)
Total Protein: 7.7 g/dL (ref 6.0–8.3)

## 2018-01-09 LAB — CBC WITH DIFFERENTIAL/PLATELET
Basophils Absolute: 0 10*3/uL (ref 0.0–0.1)
Basophils Relative: 0.5 % (ref 0.0–3.0)
Eosinophils Absolute: 0.1 10*3/uL (ref 0.0–0.7)
Eosinophils Relative: 1.1 % (ref 0.0–5.0)
HCT: 42.7 % (ref 36.0–46.0)
Hemoglobin: 15.1 g/dL — ABNORMAL HIGH (ref 12.0–15.0)
Lymphocytes Relative: 43.1 % (ref 12.0–46.0)
Lymphs Abs: 2.8 10*3/uL (ref 0.7–4.0)
MCHC: 35.3 g/dL (ref 30.0–36.0)
MCV: 90.4 fl (ref 78.0–100.0)
Monocytes Absolute: 0.4 10*3/uL (ref 0.1–1.0)
Monocytes Relative: 6.9 % (ref 3.0–12.0)
Neutro Abs: 3.1 10*3/uL (ref 1.4–7.7)
Neutrophils Relative %: 48.4 % (ref 43.0–77.0)
Platelets: 247 10*3/uL (ref 150.0–400.0)
RBC: 4.72 Mil/uL (ref 3.87–5.11)
RDW: 12.6 % (ref 11.5–15.5)
WBC: 6.5 10*3/uL (ref 4.0–10.5)

## 2018-01-09 LAB — VITAMIN D 25 HYDROXY (VIT D DEFICIENCY, FRACTURES): VITD: 19.66 ng/mL — ABNORMAL LOW (ref 30.00–100.00)

## 2018-01-09 LAB — LIPID PANEL
Cholesterol: 236 mg/dL — ABNORMAL HIGH (ref 0–200)
HDL: 55.4 mg/dL (ref >39.00)
NonHDL: 180.11
Total CHOL/HDL Ratio: 4
Triglycerides: 231 mg/dL — ABNORMAL HIGH (ref 0.0–149.0)
VLDL: 46.2 mg/dL — ABNORMAL HIGH (ref 0.0–40.0)

## 2018-01-09 LAB — TSH: TSH: 2.61 u[IU]/mL (ref 0.35–4.50)

## 2018-01-09 LAB — LDL CHOLESTEROL, DIRECT: Direct LDL: 164 mg/dL

## 2018-01-09 LAB — HEMOGLOBIN A1C: Hgb A1c MFr Bld: 5.8 % (ref 4.6–6.5)

## 2018-01-11 ENCOUNTER — Other Ambulatory Visit: Payer: Self-pay | Admitting: Internal Medicine

## 2018-01-11 DIAGNOSIS — R7303 Prediabetes: Secondary | ICD-10-CM | POA: Insufficient documentation

## 2018-01-11 DIAGNOSIS — E559 Vitamin D deficiency, unspecified: Secondary | ICD-10-CM | POA: Insufficient documentation

## 2018-01-11 MED ORDER — ERGOCALCIFEROL 1.25 MG (50000 UT) PO CAPS: 50000.0000 [IU] | ORAL_CAPSULE | ORAL | 2 refills | Status: DC

## 2018-01-16 DIAGNOSIS — H0015 Chalazion left lower eyelid: Secondary | ICD-10-CM | POA: Diagnosis not present

## 2018-01-16 DIAGNOSIS — H1032 Unspecified acute conjunctivitis, left eye: Secondary | ICD-10-CM | POA: Diagnosis not present

## 2018-01-25 DIAGNOSIS — H10412 Chronic giant papillary conjunctivitis, left eye: Secondary | ICD-10-CM | POA: Diagnosis not present

## 2018-02-20 ENCOUNTER — Ambulatory Visit: Payer: BLUE CROSS/BLUE SHIELD | Admitting: Family Medicine

## 2018-03-01 ENCOUNTER — Ambulatory Visit: Payer: BLUE CROSS/BLUE SHIELD | Admitting: Family Medicine

## 2018-03-06 ENCOUNTER — Other Ambulatory Visit: Payer: Self-pay | Admitting: Cardiology

## 2018-03-08 ENCOUNTER — Ambulatory Visit: Payer: BLUE CROSS/BLUE SHIELD | Admitting: Family Medicine

## 2018-03-13 ENCOUNTER — Encounter: Payer: Self-pay | Admitting: Family

## 2018-03-13 ENCOUNTER — Telehealth: Payer: Self-pay | Admitting: Family

## 2018-03-13 ENCOUNTER — Encounter: Payer: BLUE CROSS/BLUE SHIELD | Admitting: Family

## 2018-03-13 DIAGNOSIS — R51 Headache: Principal | ICD-10-CM

## 2018-03-13 DIAGNOSIS — G8929 Other chronic pain: Secondary | ICD-10-CM

## 2018-03-13 DIAGNOSIS — R519 Headache, unspecified: Secondary | ICD-10-CM
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
## 2018-03-13 NOTE — Telephone Encounter (Signed)
close

## 2018-03-15 ENCOUNTER — Telehealth: Payer: Self-pay

## 2018-03-15 ENCOUNTER — Ambulatory Visit (INDEPENDENT_AMBULATORY_CARE_PROVIDER_SITE_OTHER): Payer: BLUE CROSS/BLUE SHIELD | Admitting: *Deleted

## 2018-03-15 DIAGNOSIS — I442 Atrioventricular block, complete: Secondary | ICD-10-CM | POA: Diagnosis not present

## 2018-03-15 NOTE — Telephone Encounter (Signed)
Spoke with pt and reminded pt of remote transmission that is due today. Pt verbalized understanding.   

## 2018-03-16 ENCOUNTER — Encounter: Payer: Self-pay | Admitting: Cardiology

## 2018-03-16 ENCOUNTER — Encounter: Payer: Self-pay | Admitting: Internal Medicine

## 2018-03-16 NOTE — Progress Notes (Signed)
Remote pacemaker transmission.   

## 2018-03-19 LAB — CUP PACEART REMOTE DEVICE CHECK
Battery Remaining Longevity: 113 mo
Battery Remaining Percentage: 95.5 %
Battery Voltage: 2.99 V
Brady Statistic AP VP Percent: 1 %
Brady Statistic AP VS Percent: 1 %
Brady Statistic AS VP Percent: 99 %
Brady Statistic AS VS Percent: 1 %
Brady Statistic RA Percent Paced: 1 %
Brady Statistic RV Percent Paced: 99 %
Date Time Interrogation Session: 20190626182943
Implantable Lead Implant Date: 20170608
Implantable Lead Implant Date: 20170608
Implantable Lead Location: 753859
Implantable Lead Location: 753860
Implantable Pulse Generator Implant Date: 20170608
Lead Channel Impedance Value: 410 Ohm
Lead Channel Impedance Value: 540 Ohm
Lead Channel Pacing Threshold Amplitude: 0.625 V
Lead Channel Pacing Threshold Amplitude: 0.75 V
Lead Channel Pacing Threshold Pulse Width: 0.4 ms
Lead Channel Pacing Threshold Pulse Width: 0.4 ms
Lead Channel Sensing Intrinsic Amplitude: 12 mV
Lead Channel Sensing Intrinsic Amplitude: 3.2 mV
Lead Channel Setting Pacing Amplitude: 2 V
Lead Channel Setting Pacing Amplitude: 2 V
Lead Channel Setting Pacing Pulse Width: 0.4 ms
Lead Channel Setting Sensing Sensitivity: 2.5 mV
Pulse Gen Model: 2272
Pulse Gen Serial Number: 7903881

## 2018-03-20 ENCOUNTER — Other Ambulatory Visit: Payer: Self-pay | Admitting: Family

## 2018-03-20 DIAGNOSIS — R51 Headache: Principal | ICD-10-CM

## 2018-03-20 DIAGNOSIS — R070 Pain in throat: Secondary | ICD-10-CM | POA: Diagnosis not present

## 2018-03-20 DIAGNOSIS — J039 Acute tonsillitis, unspecified: Secondary | ICD-10-CM | POA: Diagnosis not present

## 2018-03-20 DIAGNOSIS — R519 Headache, unspecified: Secondary | ICD-10-CM

## 2018-04-10 DIAGNOSIS — H9209 Otalgia, unspecified ear: Secondary | ICD-10-CM | POA: Diagnosis not present

## 2018-04-10 DIAGNOSIS — R070 Pain in throat: Secondary | ICD-10-CM | POA: Diagnosis not present

## 2018-04-11 ENCOUNTER — Ambulatory Visit: Payer: Self-pay | Admitting: *Deleted

## 2018-04-11 NOTE — Telephone Encounter (Signed)
Pt reports UTI symptoms x 6 days. Reports dysuria at onset, took cranberry pills and increased water intake; burning with urination decreased "Somewhat", still present. Reports frequency and urgency. States urine is clear, has not noted odor. Afebrile.. Pt states she has intermittent left lower back pain, radiates to "front" at times. States occurred last night, 10/10, resolved with warm compresses. Milder today, remains intermittent.  NT spoke with Patina;  appt secured with Dr. Judie Grieve for tomorrow AM. Pt directed to go to ED if symptoms worsen, back pain increases in severity, frequency, fever occurs, hematuria. Verbalizes understanding.   Reason for Disposition . Side (flank) or lower back pain present  Answer Assessment - Initial Assessment Questions 1. SYMPTOM: "What's the main symptom you're concerned about?" (e.g., frequency, incontinence)     Burning with urination, urgency, frequency 2. ONSET: "When did the   start?"     6 days ago 3. PAIN: "Is there any pain?" If so, ask: "How bad is it?" (Scale: 1-10; mild, moderate, severe)     Lower back pain, left sided, radiated to front 10/10 last night. Intermittent, less today 4. CAUSE: "What do you think is causing the symptoms?"     UTI 5. OTHER SYMPTOMS: "Do you have any other symptoms?" (e.g., fever, flank pain, blood in urine, pain with urination)     Clear, drinks a lot of water, afebrile  Protocols used: URINARY [facility]

## 2018-04-12 ENCOUNTER — Ambulatory Visit: Payer: BLUE CROSS/BLUE SHIELD | Admitting: Internal Medicine

## 2018-04-12 ENCOUNTER — Encounter: Payer: Self-pay | Admitting: Internal Medicine

## 2018-04-12 VITALS — BP 130/70 | HR 84 | Temp 97.4°F | Ht 63.0 in | Wt 204.4 lb

## 2018-04-12 DIAGNOSIS — R300 Dysuria: Secondary | ICD-10-CM

## 2018-04-12 DIAGNOSIS — N3000 Acute cystitis without hematuria: Secondary | ICD-10-CM

## 2018-04-12 MED ORDER — CIPROFLOXACIN HCL 500 MG PO TABS: 500.0000 mg | ORAL_TABLET | Freq: Two times a day (BID) | ORAL | 0 refills | Status: DC

## 2018-04-12 NOTE — Progress Notes (Signed)
Chief Complaint  Patient presents with   Urinary Tract Infection   Dysuria c/w uti since Monday and had left low back pain intense radiating midline to left low back worse in pm with dysuria, lower ab pain. s'xs worse Tuesday and tried Tylenol 1000 mg x 1 and did not feel well yesterday temp was 99.1. She has h/o UTI years ago with Dr. Luella Cook   Review of Systems  Constitutional: Positive for malaise/fatigue. Negative for fever.  Gastrointestinal: Positive for abdominal pain.  Genitourinary: Positive for dysuria and flank pain.  Musculoskeletal: Positive for back pain.   Past Medical History:  Diagnosis Date   Anxiety    Hyperlipidemia    Hypertension    Interstitial lung disease (HCC)    Pollen allergies    Squamous cell carcinoma 2014   skin cancer   Subclinical hyperthyroidism 06/17/2014   Past Surgical History:  Procedure Laterality Date   COSMETIC SURGERY     EP IMPLANTABLE DEVICE N/A 02/26/2016   Procedure: Pacemaker Implant;  Surgeon: Will Jorja Loa, MD;  Location: [facility];  Service: Cardiovascular;  Laterality: N/A;   TUBAL LIGATION     Family History  Problem Relation Age of Onset   Anxiety disorder Mother    Hypertension Mother    Cancer Maternal Aunt        breast   Heart failure Maternal Grandfather    Social History   Socioeconomic History   Marital status: Married    Spouse name: Not on file   Number of children: 1   Years of education: Not on file   Highest education level: Not on file  Occupational History   Occupation: house wife    Employer: [employer]  Social Needs   Financial resource strain: Not on file   Food insecurity:    Worry: Not on file    Inability: Not on file   Transportation needs:    Medical: Not on file    Non-medical: Not on file  Tobacco Use   Smoking status: Former Smoker   Smokeless tobacco: Never Used  Substance and Sexual Activity   Alcohol use: No   Drug use: No   Sexual  activity: Not on file  Lifestyle   Physical activity:    Days per week: Not on file    Minutes per session: Not on file   Stress: Not on file  Relationships   Social connections:    Talks on phone: Not on file    Gets together: Not on file    Attends religious service: Not on file    Active member of club or organization: Not on file    Attends meetings of clubs or organizations: Not on file    Relationship status: Not on file   Intimate partner violence:    Fear of current or ex partner: Not on file    Emotionally abused: Not on file    Physically abused: Not on file    Forced sexual activity: Not on file  Other Topics Concern   Not on file  Social History Narrative   Regular exercise-no   Current Meds  Medication Sig   acetaminophen (TYLENOL) 500 MG tablet Take 500 mg by mouth every 6 (six) hours as needed.   carvedilol (COREG) 25 MG tablet TAKE ONE TABLET BY MOUTH TWICE A DAY WITH MEALS   cholecalciferol (VITAMIN D) 1000 units tablet Take 1,000 Units by mouth daily.   diazepam (VALIUM) 5 MG tablet Take 1 tablet (  5 mg total) by mouth daily as needed for anxiety.   hydrochlorothiazide (HYDRODIURIL) 25 MG tablet TAKE ONE TABLET BY MOUTH DAILY   nortriptyline (PAMELOR) 10 MG capsule Take 2 capsules (20 mg total) by mouth at bedtime.   Allergies  Allergen Reactions   Codeine     REACTION: hives, itching   Neomycin-Polymyxin-Dexameth     Swelling and itching of eye    Oxycodone-Acetaminophen     REACTION: unspecified   Paroxetine    Sulfa Antibiotics Hives    Other reaction(s): RASH Other reaction(s): RASH    Sulfonamide Derivatives     REACTION: unspecified   Lisinopril Rash   Recent Results (from the past 2160 hour(s))  CUP PACEART REMOTE DEVICE CHECK     Status: None   Collection Time: 03/15/18  6:29 PM  Result Value Ref Range   Date Time Interrogation Session [id number]    Pulse Generator Manufacturer [employer]    Pulse Gen Model 2272 Assurity  MRI    Pulse Gen Serial Number [id number]    Clinic Name [facility]    Implantable Pulse Generator Type Implantable Pulse Generator    Implantable Pulse Generator Implant Date 24401027    Implantable Lead Manufacturer [facility]    Implantable Lead Model Tendril MRI [id number]    Implantable Lead Serial Number [id number]    Implantable Lead Implant Date [id number]    Implantable Lead Location Detail 1 UNKNOWN    Implantable Lead Location [id number]    Implantable Lead Manufacturer [facility]    Implantable Lead Model Tendril MRI [id number]    Implantable Lead Serial Number [id number]    Implantable Lead Implant Date [id number]    Implantable Lead Location Detail 1 UNKNOWN    Implantable Lead Location [id number]    Lead Channel Setting Sensing Sensitivity 2.5 mV   Lead Channel Setting Sensing Adaptation Mode Fixed Pacing    Lead Channel Setting Pacing Amplitude 2.0 V   Lead Channel Setting Pacing Pulse Width 0.4 ms   Lead Channel Setting Pacing Amplitude 2.0 V   Lead Channel Status     Lead Channel Impedance Value 410 ohm   Lead Channel Sensing Intrinsic Amplitude 3.2 mV   Lead Channel Pacing Threshold Amplitude 0.75 V   Lead Channel Pacing Threshold Pulse Width 0.4 ms   Lead Channel Status     Lead Channel Impedance Value 540 ohm   Lead Channel Sensing Intrinsic Amplitude 12.0 mV   Lead Channel Pacing Threshold Amplitude 0.625 V   Lead Channel Pacing Threshold Pulse Width 0.4 ms   Battery Status MOS    Battery Remaining Longevity 113 mo   Battery Remaining Percentage 95.5 %   Battery Voltage 2.99 V   Brady Statistic RA Percent Paced 1.0 %   Brady Statistic RV Percent Paced 99.0 %   Brady Statistic AP VP Percent 1.0 %   Brady Statistic AS VP Percent 99.0 %   Brady Statistic AP VS Percent 1.0 %   Brady Statistic AS VS Percent 1.0 %   Eval Rhythm AsVp    Objective  Body mass index is 36.21 kg/m. Wt Readings from Last 3 Encounters:  04/12/18 204 lb 6.4 oz (92.7 kg)  03/13/18 206 lb 4 oz  (93.6 kg)  12/19/17 203 lb 14.4 oz (92.5 kg)   Temp Readings from Last 3 Encounters:  04/12/18 (!) 97.4 F (36.3 C) (Oral)  03/13/18 98 F (36.7 C) (Oral)  12/19/17 98.1 F (36.7 C)   BP Readings from Last 3 Encounters:  04/12/18  130/70  03/13/18 (!) 158/110  12/19/17 122/70   Pulse Readings from Last 3 Encounters:  04/12/18 84  03/13/18 86  12/19/17 86    Physical Exam  Constitutional: She is oriented to person, place, and time. Vital signs are normal. She appears well-developed and well-nourished. She is cooperative.  HENT:  Head: Normocephalic and atraumatic.  Mouth/Throat: Oropharynx is clear and moist and mucous membranes are normal.  Eyes: Pupils are equal, round, and reactive to light. Conjunctivae are normal.  Cardiovascular: Normal rate, regular rhythm and normal heart sounds.  Pulmonary/Chest: Effort normal and breath sounds normal.  Abdominal: Soft. Bowel sounds are normal. There is no tenderness. There is no CVA tenderness.  No CVA ttp b/l   Neurological: She is alert and oriented to person, place, and time. Gait normal.  Skin: Skin is warm, dry and intact.  Psychiatric: She has a normal mood and affect. Her speech is normal and behavior is normal. Judgment and thought content normal. Cognition and memory are normal.  Nursing note and vitals reviewed.   Assessment   1. UTI  Plan   1. Disc otc pyridium cipro bid 500 mg x 5 days  UA and culture today  Prn tylenol    Provider: Dr. French Ana McLean-Scocuzza-[facility] Medicine

## 2018-04-12 NOTE — Progress Notes (Signed)
Pre visit review using our clinic review tool, if applicable. No additional management support is needed unless otherwise documented below in the visit note. 

## 2018-04-12 NOTE — Patient Instructions (Signed)
AZO medication for UTI Pyridium x 2 days for burning sx's    Urinary Tract Infection, Adult A urinary tract infection (UTI) is an infection of any part of the urinary tract. The urinary tract includes the:  Kidneys.  Ureters.  Bladder.  Urethra.  These organs make, store, and get rid of pee (urine) in the body. Follow these instructions at home:  Take over-the-counter and prescription medicines only as told by your doctor.  If you were prescribed an antibiotic medicine, take it as told by your doctor. Do not stop taking the antibiotic even if you start to feel better.  Avoid the following drinks: ? Alcohol. ? Caffeine. ? Tea. ? Carbonated drinks.  Drink enough fluid to keep your pee clear or pale yellow.  Keep all follow-up visits as told by your doctor. This is important.  Make sure to: ? Empty your bladder often and completely. Do not to hold pee for long periods of time. ? Empty your bladder before and after sex. ? Wipe from front to back after a bowel movement if you are female. Use each tissue one time when you wipe. Contact a doctor if:  You have back pain.  You have a fever.  You feel sick to your stomach (nauseous).  You throw up (vomit).  Your symptoms do not get better after 3 days.  Your symptoms go away and then come back. Get help right away if:  You have very bad back pain.  You have very bad lower belly (abdominal) pain.  You are throwing up and cannot keep down any medicines or water. This information is not intended to replace advice given to you by your health care provider. Make sure you discuss any questions you have with your health care provider. Document Released: 02/23/2008 Document Revised: 02/12/2016 Document Reviewed: 07/28/2015 Elsevier Interactive Patient Education  [employer].

## 2018-04-13 LAB — URINALYSIS, ROUTINE W REFLEX MICROSCOPIC
Bilirubin, UA: NEGATIVE
Glucose, UA: NEGATIVE
Ketones, UA: NEGATIVE
Leukocytes, UA: NEGATIVE
Nitrite, UA: NEGATIVE
Protein, UA: NEGATIVE
RBC, UA: NEGATIVE
Specific Gravity, UA: <=1.005 — AB (ref 1.005–1.030)
Urobilinogen, Ur: 0.2 mg/dL (ref 0.2–1.0)
pH, UA: 8 — ABNORMAL HIGH (ref 5.0–7.5)

## 2018-04-14 LAB — URINE CULTURE: Organism ID, Bacteria: NO GROWTH

## 2018-04-19 ENCOUNTER — Ambulatory Visit: Payer: BLUE CROSS/BLUE SHIELD | Admitting: Family Medicine

## 2018-04-19 DIAGNOSIS — R51 Headache: Secondary | ICD-10-CM | POA: Diagnosis not present

## 2018-06-05 ENCOUNTER — Ambulatory Visit: Payer: BLUE CROSS/BLUE SHIELD | Admitting: Family Medicine

## 2018-06-13 ENCOUNTER — Other Ambulatory Visit: Payer: Self-pay | Admitting: Internal Medicine

## 2018-06-14 ENCOUNTER — Ambulatory Visit (INDEPENDENT_AMBULATORY_CARE_PROVIDER_SITE_OTHER): Payer: BLUE CROSS/BLUE SHIELD | Admitting: *Deleted

## 2018-06-14 DIAGNOSIS — I442 Atrioventricular block, complete: Secondary | ICD-10-CM | POA: Diagnosis not present

## 2018-06-14 NOTE — Progress Notes (Signed)
Remote pacemaker transmission.   

## 2018-06-15 ENCOUNTER — Encounter: Payer: Self-pay | Admitting: Cardiology

## 2018-06-20 ENCOUNTER — Telehealth: Payer: Self-pay | Admitting: Family Medicine

## 2018-06-20 NOTE — Telephone Encounter (Signed)
The patient's mother and father asked me if I would see the patient as a primary care patient. She had previously been my long-term patient and left my practice 2 years ago.    Lupita Leash, would you mind calling her and asking her if she wants this? If she does not, it is a non-issue.  She is 57 years old and what matters is what she wants. I would want her to understand that I work 3 days a week and have a full practice, so my availability can be less than other MD's.

## 2018-06-20 NOTE — Telephone Encounter (Signed)
Fine with me

## 2018-06-20 NOTE — Telephone Encounter (Signed)
Spoke with Misty Stanley.  She would like to re-establish care with Dr. Patsy Lager.  She understands he is part-time and is not in the office but 2 to 3 days a week.  She states she is not happy were she is currently going and that she made a mistake leaving Dr. Patsy Lager.  If you are okay with Makena re-establishing, please forward to Zella Ball to call and schedule appointment.

## 2018-06-21 NOTE — Telephone Encounter (Signed)
Anne Rush, can you set her up an appt to reest care

## 2018-06-21 NOTE — Telephone Encounter (Signed)
Appointment 11/18 pt aware

## 2018-06-26 ENCOUNTER — Ambulatory Visit: Payer: BLUE CROSS/BLUE SHIELD | Admitting: Internal Medicine

## 2018-06-26 ENCOUNTER — Encounter: Payer: Self-pay | Admitting: Internal Medicine

## 2018-06-26 VITALS — BP 140/100 | HR 87 | Temp 98.3°F | Resp 14 | Ht 63.0 in | Wt 202.6 lb

## 2018-06-26 DIAGNOSIS — T783XXS Angioneurotic edema, sequela: Secondary | ICD-10-CM

## 2018-06-26 DIAGNOSIS — M25562 Pain in left knee: Secondary | ICD-10-CM

## 2018-06-26 DIAGNOSIS — D751 Secondary polycythemia: Secondary | ICD-10-CM | POA: Diagnosis not present

## 2018-06-26 DIAGNOSIS — R7303 Prediabetes: Secondary | ICD-10-CM

## 2018-06-26 DIAGNOSIS — Z1239 Encounter for other screening for malignant neoplasm of breast: Secondary | ICD-10-CM | POA: Diagnosis not present

## 2018-06-26 DIAGNOSIS — G8929 Other chronic pain: Secondary | ICD-10-CM

## 2018-06-26 DIAGNOSIS — I10 Essential (primary) hypertension: Secondary | ICD-10-CM

## 2018-06-26 DIAGNOSIS — H029 Unspecified disorder of eyelid: Secondary | ICD-10-CM | POA: Diagnosis not present

## 2018-06-26 LAB — COMPREHENSIVE METABOLIC PANEL
ALT: 14 U/L (ref 0–35)
AST: 16 U/L (ref 0–37)
Albumin: 4.7 g/dL (ref 3.5–5.2)
Alkaline Phosphatase: 50 U/L (ref 39–117)
BUN: 9 mg/dL (ref 6–23)
CO2: 31 mEq/L (ref 19–32)
Calcium: 9.8 mg/dL (ref 8.4–10.5)
Chloride: 99 mEq/L (ref 96–112)
Creatinine, Ser: 0.63 mg/dL (ref 0.40–1.20)
GFR: 104.07 mL/min (ref >60.00)
Glucose, Bld: 108 mg/dL — ABNORMAL HIGH (ref 70–99)
Potassium: 4.4 mEq/L (ref 3.5–5.1)
Sodium: 138 mEq/L (ref 135–145)
Total Bilirubin: 0.7 mg/dL (ref 0.2–1.2)
Total Protein: 7.4 g/dL (ref 6.0–8.3)

## 2018-06-26 LAB — LIPID PANEL
Cholesterol: 242 mg/dL — ABNORMAL HIGH (ref 0–200)
HDL: 46.3 mg/dL (ref >39.00)
NonHDL: 196.13
Total CHOL/HDL Ratio: 5
Triglycerides: 261 mg/dL — ABNORMAL HIGH (ref 0.0–149.0)
VLDL: 52.2 mg/dL — ABNORMAL HIGH (ref 0.0–40.0)

## 2018-06-26 LAB — LDL CHOLESTEROL, DIRECT: Direct LDL: 166 mg/dL

## 2018-06-26 MED ORDER — FUROSEMIDE 20 MG PO TABS: 20.0000 mg | ORAL_TABLET | Freq: Every day | ORAL | 3 refills | Status: DC

## 2018-06-26 MED ORDER — HYDROCHLOROTHIAZIDE 25 MG PO TABS: 25.0000 mg | ORAL_TABLET | Freq: Every day | ORAL | 1 refills | Status: DC

## 2018-06-26 MED ORDER — MONTELUKAST SODIUM 10 MG PO TABS: 10.0000 mg | ORAL_TABLET | Freq: Every day | ORAL | 0 refills | Status: DC

## 2018-06-26 MED ORDER — SPIRONOLACTONE 50 MG PO TABS: 50.0000 mg | ORAL_TABLET | ORAL | 0 refills | Status: DC | PRN

## 2018-06-26 NOTE — Patient Instructions (Addendum)
Trial  of fexofenadine 180 mg daily  For one week.  To see if swelling of eyelid does not occur   The cheetos may be causing fluid retention,  So try adding SPIRONOLACTONE 50 MG  Every 3 days    Ok to add singulair after one week  if  The swelling episodes continue .     For left knee:  400 mg ibuprofen prior to treadmil.  15 minutes on 2 mp hr if tolerated,  And    Ice for 15 minutes afterward  increase weekly by 0.5 mph and by 5 minutes    You are overdue for colon Cancer screening and breast Cancer screening !!

## 2018-06-26 NOTE — Progress Notes (Signed)
Subjective:  Patient ID: Anne Rush, female    DOB: Jan 26, 1961  Age: 57 y.o. MRN: [id number]  CC: The primary encounter diagnosis was Eyelid abnormality. Diagnoses of Polycythemia, secondary, Prediabetes, Breast cancer screening, Allergic angioedema, sequela, Chronic pain of left knee, and Essential hypertension were also pertinent to this visit.  HPI Anne Rush presents for follow up on multiple issues.  1)  hypertension  Managed with carvedilol and HCTZ  .   patient checks blood pressure  5 times daily at home.   3/5 home  Readings have been <  130/80 at rest . Patient is NOT following a reduceD salt diet and admits to snacking on Cheetos daily,  but she states that she is taking medications as prescribed  2) Headaches:  Has seen neurology for recurrent headaches  And left eye /facial pain ,  Started  On pamelor ,  Did not tolerate dose increase due to dry eye.    No headaches currently since June. [name]    3) HM:  Has scheduled PAP Dec 12 with Jean Rosenthal   Has continue to postpone mammogram because she is concerned that her pacemaker will be damaged.  [ans to discuss with cardiologist at next visit  .   4) Left knee pain :  Left knee feeling better since she stopped being active physically .  NOt exercising   5) Recurrent swelling of left eyelids.  Has been recurrent for several years starting in 2016 that  never involved the lips or tongue but confined to the Upper and lower eyelid.  Initially considered to be angioedema secondary to losartan,  But has continued to recur despite stopping medication. Previous food allergy testing negative  In 2017 .Saw Neurology,  Local ophthalmology who treated for allergic and bacterial conjuncitivitis with no resolution .  Saw local  ENT who apparently saw no cause but mentioned "nerve related" ( trigeminal neuralgia? No records available) . MRI brain normal in 2016.  No cause found  never got to see [facility] ,  Apparently after the referral they  tried to contact her for weeks and sent her a letter .   Seeing Tanner Oct 29 at [facility].    Outpatient Medications Prior to Visit  Medication Sig Dispense Refill  . carvedilol (COREG) 25 MG tablet TAKE ONE TABLET BY MOUTH TWICE A DAY WITH MEALS 60 tablet 5  . cholecalciferol (VITAMIN D) 1000 units tablet Take 1,000 Units by mouth daily.    . diazepam (VALIUM) 5 MG tablet Take 1 tablet (5 mg total) by mouth daily as needed for anxiety. 30 tablet 5  . nortriptyline (PAMELOR) 10 MG capsule Take 2 capsules (20 mg total) by mouth at bedtime. 180 capsule 3  . hydrochlorothiazide (HYDRODIURIL) 25 MG tablet TAKE ONE TABLET BY MOUTH DAILY 30 tablet 0  . acetaminophen (TYLENOL) 500 MG tablet Take 500 mg by mouth every 6 (six) hours as needed.    . ciprofloxacin (CIPRO) 500 MG tablet Take 1 tablet (500 mg total) by mouth 2 (two) times daily. With food (Patient not taking: Reported on 06/26/2018) 10 tablet 0   No facility-administered medications prior to visit.     Review of Systems;  Patient denies headache, fevers, malaise, unintentional weight loss, skin rash, eye pain, sinus congestion and sinus pain, sore throat, dysphagia,  hemoptysis , cough, dyspnea, wheezing, chest pain, palpitations, orthopnea, edema, abdominal pain, nausea, melena, diarrhea, constipation, flank pain, dysuria, hematuria, urinary  Frequency, nocturia, numbness, tingling, seizures,  Focal weakness, Loss of consciousness,  Tremor, insomnia, depression, anxiety, and suicidal ideation.      Objective:  BP (!) 140/100 (BP Location: Left Arm, Patient Position: Sitting, Cuff Size: Large)   Pulse 87   Temp 98.3 F (36.8 C) (Oral)   Resp 14   Ht 5\' 3"  (1.6 m)   Wt 202 lb 9.6 oz (91.9 kg)   SpO2 97%   BMI 35.89 kg/m   BP Readings from Last 3 Encounters:  06/26/18 (!) 140/100  04/12/18 130/70  03/13/18 (!) 158/110    Wt Readings from Last 3 Encounters:  06/26/18 202 lb 9.6 oz (91.9 kg)  04/12/18 204 lb 6.4 oz  (92.7 kg)  03/13/18 206 lb 4 oz (93.6 kg)    General appearance: alert, cooperative and appears stated age Ears: normal TM's and external ear canals both ears Throat: lips, mucosa, and tongue normal; teeth and gums normal Neck: no adenopathy, no carotid bruit, supple, symmetrical, trachea midline and thyroid not enlarged, symmetric, no tenderness/mass/nodules Back: symmetric, no curvature. ROM normal. No CVA tenderness. Lungs: clear to auscultation bilaterally Heart: regular rate and rhythm, S1, S2 normal, no murmur, click, rub or gallop Abdomen: soft, non-tender; bowel sounds normal; no masses,  no organomegaly Pulses: 2+ and symmetric Skin: Skin color, texture, turgor normal. No rashes or lesions Lymph nodes: Cervical, supraclavicular, and axillary nodes normal.  Lab Results  Component Value Date   HGBA1C 5.8 01/09/2018   HGBA1C 5.6 04/24/2014    Lab Results  Component Value Date   CREATININE 0.63 06/26/2018   CREATININE 0.62 01/09/2018   CREATININE 0.61 12/13/2016    Lab Results  Component Value Date   WBC 6.5 01/09/2018   HGB 15.1 (H) 01/09/2018   HCT 42.7 01/09/2018   PLT 247.0 01/09/2018   GLUCOSE 108 (H) 06/26/2018   CHOL 242 (H) 06/26/2018   TRIG 261.0 (H) 06/26/2018   HDL 46.30 06/26/2018   LDLDIRECT 166.0 06/26/2018   LDLCALC 149 (H) 05/12/2015   ALT 14 06/26/2018   AST 16 06/26/2018   NA 138 06/26/2018   K 4.4 06/26/2018   CL 99 06/26/2018   CREATININE 0.63 06/26/2018   BUN 9 06/26/2018   CO2 31 06/26/2018   TSH 2.61 01/09/2018   INR 1.0 10/12/2010   HGBA1C 5.8 01/09/2018    Dg Chest 2 View  Result Date: 02/27/2016 CLINICAL DATA:  Post cardiac pacemaker placement. EXAM: CHEST  2 VIEW COMPARISON:  02/26/2016 FINDINGS: There has been interval placement of dual lead cardiac pacemaker with leads overlying the expected location of right atrium and right ventricle. Cardiomediastinal silhouette is normal. Mediastinal contours appear intact. There is no  evidence of focal airspace consolidation, pleural effusion or pneumothorax. Low lung volumes. Osseous structures are without acute abnormality. Soft tissues are grossly normal. IMPRESSION: Low lung volumes. No evidence of pneumothorax status post cardiac pacemaker placement. Electronically Signed   By: Ted Mcalpine M.D.   On: 02/27/2016 07:40   Dg Chest 2 View  Result Date: 02/26/2016 CLINICAL DATA:  Chest pain for 6 days and bradycardia. Shortness of breath on exertion EXAM: CHEST  2 VIEW COMPARISON:  Chest radiograph August 27, 2010 and chest CT October 08, 2010 FINDINGS: There is generalized interstitial prominence, stable. There is no frank edema or consolidation. There is mild scarring in the left base. Heart size and pulmonary vascularity are normal. No adenopathy. No bone lesions. IMPRESSION: Generalized interstitial prominence, likely due to underlying fibrotic type change. No edema or consolidation. Mild scarring  left base. No new opacity. Stable cardiac silhouette. Electronically Signed   By: Bretta Bang III M.D.   On: 02/26/2016 14:51    Assessment & Plan:   Problem List Items Addressed This Visit    Allergic angioedema    Presumed; however allergy testing in 2017 included foods and was NEGATIVE. Recommend trial of daily allegra,  Followed by addition of Singulair .  I suspected she is retaining fluid from her high salt diet and have recommended THAT SHE STOP EATING CHEETOS DAILY .  Trial of spironolactone (chosen cue to concurrent androgenic hair loss)  every 2-3 days       Breast cancer screening    Deferred by patient until her cardiologist can recommend facility due to pacemaker       Chronic pain of left knee    No history of prior injury.  Pain currently resolved because she has avoided any physical activity. Per patient knee swells and becomes painful on medial side when she walks,  So consequently she avoids exercise.  Prior Plain films noted show Minimal spur along  the anterior superior patella, likely representing distal quadriceps tendinosis. No fracture or joint effusion.  Encouraged to resume an outside walking program or an alternative exercise (bicycle, water aerobics)       Essential hypertension    Well controlled on current regimen. Renal function stable, no changes today.  Lab Results  Component Value Date   CREATININE 0.63 06/26/2018   Lab Results  Component Value Date   NA 138 06/26/2018   K 4.4 06/26/2018   CL 99 06/26/2018   CO2 31 06/26/2018         Relevant Medications   spironolactone (ALDACTONE) 50 MG tablet   hydrochlorothiazide (HYDRODIURIL) 25 MG tablet   Prediabetes    I have addressed  Her metabolic status and  BMI and recommended wt loss of 10% of body weight over the next 6 months using a low fat, low starch, high protein  fruit/vegetable based Mediterranean diet and working up to achieving 30 minutes of aerobic exercise a minimum of 5 days per week.   Lab Results  Component Value Date   HGBA1C 5.8 01/09/2018            Relevant Orders   Comprehensive metabolic panel (Completed)   Lipid panel (Completed)   Hemoglobin A1c    Other Visit Diagnoses    Eyelid abnormality    -  Primary   Relevant Orders   Ambulatory referral to Ophthalmology   Polycythemia, secondary       Relevant Orders   CBC w/Diff      I have discontinued Dietrich Pates. Hnat's acetaminophen, ciprofloxacin, and furosemide. I have also changed her hydrochlorothiazide. Additionally, I am having her start on montelukast and spironolactone. Lastly, I am having her maintain her diazepam, nortriptyline, carvedilol, and cholecalciferol.  Meds ordered this encounter  Medications  . montelukast (SINGULAIR) 10 MG tablet    Sig: Take 1 tablet (10 mg total) by mouth at bedtime.    Dispense:  30 tablet    Refill:  0  . DISCONTD: furosemide (LASIX) 20 MG tablet    Sig: Take 1 tablet (20 mg total) by mouth daily. As needed for fluid retention     Dispense:  30 tablet    Refill:  3  . spironolactone (ALDACTONE) 50 MG tablet    Sig: Take 1 tablet (50 mg total) by mouth every three (3) days as needed.    Dispense:  30 tablet    Refill:  0    REPLACES PREVIOUSLY SENT FUROSEMIDE  . hydrochlorothiazide (HYDRODIURIL) 25 MG tablet    Sig: Take 1 tablet (25 mg total) by mouth daily.    Dispense:  90 tablet    Refill:  1  A total of 40 minutes was spent with patient more than half of which was spent in counseling patient on the above mentioned issues , reviewing and explaining prior /recent labs and imaging studies done, and coordination of care.   Medications Discontinued During This Encounter  Medication Reason  . acetaminophen (TYLENOL) 500 MG tablet Patient has not taken in last 30 days  . ciprofloxacin (CIPRO) 500 MG tablet Patient has not taken in last 30 days  . furosemide (LASIX) 20 MG tablet   . hydrochlorothiazide (HYDRODIURIL) 25 MG tablet Reorder    Follow-up: No follow-ups on file.   Sherlene Shams, MD

## 2018-06-27 NOTE — Assessment & Plan Note (Signed)
Presumed; however allergy testing in 2017 included foods and was NEGATIVE. Recommend trial of daily allegra,  Followed by addition of Singulair .  I suspected she is retaining fluid from her high salt diet and have recommended THAT SHE STOP EATING CHEETOS DAILY .  Trial of spironolactone (chosen cue to concurrent androgenic hair loss)  every 2-3 days

## 2018-06-27 NOTE — Assessment & Plan Note (Signed)
Deferred by patient until her cardiologist can recommend facility due to pacemaker

## 2018-06-27 NOTE — Assessment & Plan Note (Signed)
Well controlled on current regimen. Renal function stable, no changes today.  Lab Results  Component Value Date   CREATININE 0.63 06/26/2018   Lab Results  Component Value Date   NA 138 06/26/2018   K 4.4 06/26/2018   CL 99 06/26/2018   CO2 31 06/26/2018

## 2018-06-27 NOTE — Assessment & Plan Note (Addendum)
I have addressed  Her metabolic status and  BMI and recommended wt loss of 10% of body weight over the next 6 months using a low fat, low starch, high protein  fruit/vegetable based Mediterranean diet and working up to achieving 30 minutes of aerobic exercise a minimum of 5 days per week.   Lab Results  Component Value Date   HGBA1C 5.8 01/09/2018

## 2018-06-27 NOTE — Assessment & Plan Note (Signed)
No history of prior injury.  Pain currently resolved because she has avoided any physical activity. Per patient knee swells and becomes painful on medial side when she walks,  So consequently she avoids exercise.  Prior Plain films noted show Minimal spur along the anterior superior patella, likely representing distal quadriceps tendinosis. No fracture or joint effusion.  Encouraged to resume an outside walking program or an alternative exercise (bicycle, water aerobics)

## 2018-07-16 ENCOUNTER — Other Ambulatory Visit: Payer: Self-pay | Admitting: Internal Medicine

## 2018-07-17 NOTE — Telephone Encounter (Signed)
Refilled: 12/19/2017 Last OV: 06/26/2018 Next OV: not scheduled

## 2018-07-18 DIAGNOSIS — H0100A Unspecified blepharitis right eye, upper and lower eyelids: Secondary | ICD-10-CM | POA: Diagnosis not present

## 2018-07-18 DIAGNOSIS — H524 Presbyopia: Secondary | ICD-10-CM | POA: Diagnosis not present

## 2018-07-18 DIAGNOSIS — H02834 Dermatochalasis of left upper eyelid: Secondary | ICD-10-CM | POA: Diagnosis not present

## 2018-07-18 DIAGNOSIS — H02831 Dermatochalasis of right upper eyelid: Secondary | ICD-10-CM | POA: Diagnosis not present

## 2018-07-25 LAB — CUP PACEART REMOTE DEVICE CHECK
Battery Remaining Longevity: 112 mo
Battery Remaining Percentage: 95.5 %
Battery Voltage: 2.98 V
Brady Statistic AP VP Percent: 1 %
Brady Statistic AP VS Percent: 1 %
Brady Statistic AS VP Percent: 99 %
Brady Statistic AS VS Percent: 1 %
Brady Statistic RA Percent Paced: 1 %
Brady Statistic RV Percent Paced: 99 %
Date Time Interrogation Session: 20190925065535
Implantable Lead Implant Date: 20170608
Implantable Lead Implant Date: 20170608
Implantable Lead Location: 753859
Implantable Lead Location: 753860
Implantable Pulse Generator Implant Date: 20170608
Lead Channel Impedance Value: 410 Ohm
Lead Channel Impedance Value: 540 Ohm
Lead Channel Pacing Threshold Amplitude: 0.625 V
Lead Channel Pacing Threshold Amplitude: 0.75 V
Lead Channel Pacing Threshold Pulse Width: 0.4 ms
Lead Channel Pacing Threshold Pulse Width: 0.4 ms
Lead Channel Sensing Intrinsic Amplitude: 12 mV
Lead Channel Sensing Intrinsic Amplitude: 4.1 mV
Lead Channel Setting Pacing Amplitude: 2 V
Lead Channel Setting Pacing Amplitude: 2 V
Lead Channel Setting Pacing Pulse Width: 0.4 ms
Lead Channel Setting Sensing Sensitivity: 2.5 mV
Pulse Gen Model: 2272
Pulse Gen Serial Number: 7903881

## 2018-08-07 ENCOUNTER — Encounter: Payer: BLUE CROSS/BLUE SHIELD | Admitting: Family Medicine

## 2018-08-23 ENCOUNTER — Encounter: Payer: Self-pay | Admitting: Cardiology

## 2018-08-23 ENCOUNTER — Ambulatory Visit: Payer: BLUE CROSS/BLUE SHIELD | Admitting: Cardiology

## 2018-08-23 VITALS — BP 138/98 | HR 97 | Ht 63.0 in | Wt 205.6 lb

## 2018-08-23 DIAGNOSIS — I442 Atrioventricular block, complete: Secondary | ICD-10-CM | POA: Diagnosis not present

## 2018-08-23 DIAGNOSIS — I10 Essential (primary) hypertension: Secondary | ICD-10-CM | POA: Diagnosis not present

## 2018-08-23 DIAGNOSIS — I5022 Chronic systolic (congestive) heart failure: Secondary | ICD-10-CM | POA: Diagnosis not present

## 2018-08-23 MED ORDER — AMLODIPINE BESYLATE 10 MG PO TABS: 10.0000 mg | ORAL_TABLET | Freq: Every day | ORAL | 11 refills | Status: DC

## 2018-08-23 MED ORDER — CARVEDILOL 25 MG PO TABS: 25.0000 mg | ORAL_TABLET | Freq: Two times a day (BID) | ORAL | 11 refills | Status: DC

## 2018-08-23 NOTE — Progress Notes (Signed)
Electrophysiology Office Note   Date:  08/23/2018   ID:  Anne Rush, DOB [birth year], MRN [id number]  PCP:  [name], MD  Primary Electrophysiologist:  Will Jorja Loa, MD    No chief complaint on file.    History of Present Illness: Anne Rush is a 57 y.o. female who presents today for electrophysiology evaluation.   She presented to the hospital in complete heart block and had a [employer]. Jude dual-chamber pacemaker placed on 02/26/16.   Today, denies symptoms of palpitations, chest pain, shortness of breath, orthopnea, PND, lower extremity edema, claudication, dizziness, presyncope, syncope, bleeding, or neurologic sequela. The patient is tolerating medications without difficulties.  Overall she is doing well.  She has had no chest pain or shortness of breath.  She has no major complaints today.   Past Medical History:  Diagnosis Date  . Anxiety   . Hyperlipidemia   . Hypertension   . Interstitial lung disease (HCC)   . Pollen allergies   . Squamous cell carcinoma 2014   skin cancer  . Subclinical hyperthyroidism 06/17/2014   Past Surgical History:  Procedure Laterality Date  . COSMETIC SURGERY    . EP IMPLANTABLE DEVICE N/A 02/26/2016   Procedure: Pacemaker Implant;  Surgeon: Will Jorja Loa, MD;  Location: [facility] INVASIVE CV [facility];  Service: Cardiovascular;  Laterality: N/A;  . TUBAL LIGATION       Current Outpatient Medications  Medication Sig Dispense Refill  . carvedilol (COREG) 25 MG tablet Take 1 tablet (25 mg total) by mouth 2 (two) times daily with a meal. 60 tablet 11  . cholecalciferol (VITAMIN D) 1000 units tablet Take 1,000 Units by mouth daily.    . diazepam (VALIUM) 5 MG tablet TAKE ONE TABLET BY MOUTH DAILY AS NEEDED FOR ANXIETY 30 tablet 4  . montelukast (SINGULAIR) 10 MG tablet Take 1 tablet (10 mg total) by mouth at bedtime. 30 tablet 0  . nortriptyline (PAMELOR) 25 MG capsule Take 1 capsule by mouth daily.    [name] spironolactone (ALDACTONE) 50  MG tablet Take 1 tablet (50 mg total) by mouth every three (3) days as needed. 30 tablet 0  . amLODipine (NORVASC) 10 MG tablet Take 1 tablet (10 mg total) by mouth daily. Stopping HCTZ 30 tablet 11   No current facility-administered medications for this visit.     Allergies:   Codeine; Losartan potassium; Neomycin-polymyxin-dexameth; Oxycodone-acetaminophen; Paroxetine; Sulfa antibiotics; Sulfonamide derivatives; and Lisinopril   Social History:  The patient  reports that she has quit smoking. She has never used smokeless tobacco. She reports that she does not drink alcohol or use drugs.   Family History:  The patient's family history includes Anxiety disorder in her mother; Cancer in her maternal aunt; Heart failure in her maternal grandfather; Hypertension in her mother.   ROS:  Please see the history of present illness.   Otherwise, review of systems is positive for none.   All other systems are reviewed and negative.   PHYSICAL EXAM: VS:  BP (!) 138/98   Pulse 97   Ht 5\' 3"  (1.6 m)   Wt 205 lb 9.6 oz (93.3 kg)   BMI 36.42 kg/m  , BMI Body mass index is 36.42 kg/m. GEN: Well nourished, well developed, in no acute distress  HEENT: normal  Neck: no JVD, carotid bruits, or masses Cardiac: RRR; no murmurs, rubs, or gallops,no edema  Respiratory:  clear to auscultation bilaterally, normal work of breathing GI: soft, nontender, nondistended, + BS MS:  no deformity or atrophy  Skin: warm and dry, device site well healed Neuro:  Strength and sensation are intact Psych: euthymic mood, full affect  EKG:  EKG is ordered today. Personal review of the ekg ordered shows SR, V paced  Personal review of the device interrogation today. Results in [facility]   Recent Labs: 01/09/2018: Hemoglobin 15.1; Platelets 247.0; TSH 2.61 06/26/2018: ALT 14; BUN 9; Creatinine, Ser 0.63; Potassium 4.4; Sodium 138    Lipid Panel     Component Value Date/Time   CHOL 242 (H) 06/26/2018 1031   TRIG 261.0  (H) 06/26/2018 1031   HDL 46.30 06/26/2018 1031   CHOLHDL 5 06/26/2018 1031   VLDL 52.2 (H) 06/26/2018 1031   LDLCALC 149 (H) 05/12/2015 0901   LDLDIRECT 166.0 06/26/2018 1031     Wt Readings from Last 3 Encounters:  08/23/18 205 lb 9.6 oz (93.3 kg)  06/26/18 202 lb 9.6 oz (91.9 kg)  04/12/18 204 lb 6.4 oz (92.7 kg)      Other studies Reviewed: Additional studies/ records that were reviewed today include: TTE 03/29/17 Review of the above records today demonstrates:  - Left ventricle: The cavity size was normal. Wall thickness was   increased in a pattern of mild LVH. Systolic function was normal.   The estimated ejection fraction was in the range of 50% to 55%.   Apical septal hypokinesis. Doppler parameters are consistent with   abnormal left ventricular relaxation (grade 1 diastolic   dysfunction). The E/e&' ratio is between 8-15, suggesting   indeterminate LV filling pressure. - Left atrium: The atrium was normal in size. - Tricuspid valve: There was mild regurgitation. - Pulmonary arteries: PA peak pressure: 27 mm Hg (S). - Inferior vena cava: The vessel was normal in size. The   respirophasic diameter changes were in the normal range (>= 50%),   consistent with normal central venous pressure.   ASSESSMENT AND PLAN:  1.  Complete AV block: Jude dual-chamber pacemaker implanted 02/27/2016.  Properly.  No changes.  2. Hypertension: Blood pressure remains elevated.  Due to that, amlodipine and stop HCTZ.  I have encouraged her to take the Aldactone as prescribed by her primary physician  3. Mild systolic heart failure, chronic: Only on Coreg.  Feeling well.  No changes.  Current medicines are reviewed at length with the patient today.   The patient does not have concerns regarding her medicines.  The following changes were made today: Stop HCTZ, start amlodipine  Labs/ tests ordered today include:  Orders Placed This Encounter  Procedures  . EKG 12-Lead      Disposition:   FU with Will Camnitz 12 months  Signed, Will Jorja Loa, MD  08/23/2018 3:50 PM     [facility] [address] [place] [address] [phone] (office) [phone] (fax)

## 2018-08-23 NOTE — Patient Instructions (Addendum)
Medication Instructions:  Your physician has recommended you make the following change in your medication: 1. STOP Hydrochlorothiazide 1. START Amlodipine 10 mg once daily  *If you need a refill on your cardiac medications before your next appointment, please call your pharmacy*  Labwork: None ordered  Testing/Procedures: None ordered  Follow-Up: Remote monitoring is used to monitor your Pacemaker or ICD from home. This monitoring reduces the number of office visits required to check your device to one time per year. It allows [place] to keep an eye on the functioning of your device to ensure it is working properly. You are scheduled for a device check from home on 09/14/2018. You may send your transmission at any time that day. If you have a wireless device, the transmission will be sent automatically. After your physician reviews your transmission, you will receive a postcard with your next transmission date.  Your physician wants you to follow-up in: 1 year with Dr. Elberta Fortis.  You will receive a reminder letter in the mail two months in advance. If you don't receive a letter, please call our office to schedule the follow-up appointment.  Thank you for choosing [facility]!!   Dory Horn, RN [phone]  Any Other Special Instructions Will Be Listed Below (If Applicable).  Lisinopril tablets What is this medicine? LISINOPRIL (lyse IN oh pril) is an ACE inhibitor. This medicine is used to treat high blood pressure and heart failure. It is also used to protect the heart immediately after a heart attack. This medicine may be used for other purposes; ask your health care provider or pharmacist if you have questions. COMMON BRAND NAME(S): Prinivil, Zestril What should I tell my health care provider before I take this medicine? They need to know if you have any of these conditions: -diabetes -heart or blood vessel disease -kidney disease -low blood pressure -previous swelling of  the tongue, face, or lips with difficulty breathing, difficulty swallowing, hoarseness, or tightening of the throat -an unusual or allergic reaction to lisinopril, other ACE inhibitors, insect venom, foods, dyes, or preservatives -pregnant or trying to get pregnant -breast-feeding How should I use this medicine? Take this medicine by mouth with a glass of water. Follow the directions on your prescription label. You may take this medicine with or without food. If it upsets your stomach, take it with food. Take your medicine at regular intervals. Do not take it more often than directed. Do not stop taking except on your doctor's advice. Talk to your pediatrician regarding the use of this medicine in children. Special care may be needed. While this drug may be prescribed for children as young as 32 years of age for selected conditions, precautions do apply. Overdosage: If you think you have taken too much of this medicine contact a poison control center or emergency room at once. NOTE: This medicine is only for you. Do not share this medicine with others. What if I miss a dose? If you miss a dose, take it as soon as you can. If it is almost time for your next dose, take only that dose. Do not take double or extra doses. What may interact with this medicine? Do not take this medicine with any of the following medications: -hymenoptera venom -sacubitril; valsartan This medicines may also interact with the following medications: -aliskiren -angiotensin receptor blockers, like losartan or valsartan -certain medicines for diabetes -diuretics -everolimus -gold compounds -lithium -NSAIDs, medicines for pain and inflammation, like ibuprofen or naproxen -potassium salts or supplements -salt substitutes -  sirolimus -temsirolimus This list may not describe all possible interactions. Give your health care provider a list of all the medicines, herbs, non-prescription drugs, or dietary supplements you use.  Also tell them if you smoke, drink alcohol, or use illegal drugs. Some items may interact with your medicine. What should I watch for while using this medicine? Visit your doctor or health care professional for regular check ups. Check your blood pressure as directed. Ask your doctor what your blood pressure should be, and when you should contact him or her. Do not treat yourself for coughs, colds, or pain while you are using this medicine without asking your doctor or health care professional for advice. Some ingredients may increase your blood pressure. Women should inform their doctor if they wish to become pregnant or think they might be pregnant. There is a potential for serious side effects to an unborn child. Talk to your health care professional or pharmacist for more information. Check with your doctor or health care professional if you get an attack of severe diarrhea, nausea and vomiting, or if you sweat a lot. The loss of too much body fluid can make it dangerous for you to take this medicine. You may get drowsy or dizzy. Do not drive, use machinery, or do anything that needs mental alertness until you know how this drug affects you. Do not stand or sit up quickly, especially if you are an older patient. This reduces the risk of dizzy or fainting spells. Alcohol can make you more drowsy and dizzy. Avoid alcoholic drinks. Avoid salt substitutes unless you are told otherwise by your doctor or health care professional. What side effects may I notice from receiving this medicine? Side effects that you should report to your doctor or health care professional as soon as possible: -allergic reactions like skin rash, itching or hives, swelling of the hands, feet, face, lips, throat, or tongue -breathing problems -signs and symptoms of kidney injury like trouble passing urine or change in the amount of urine -signs and symptoms of increased potassium like muscle weakness; chest pain; or fast,  irregular heartbeat -signs and symptoms of liver injury like dark yellow or brown urine; general ill feeling or flu-like symptoms; light-colored stools; loss of appetite; nausea; right upper belly pain; unusually weak or tired; yellowing of the eyes or skin -signs and symptoms of low blood pressure like dizziness; feeling faint or lightheaded, falls; unusually weak or tired -stomach pain with or without nausea and vomiting Side effects that usually do not require medical attention (report to your doctor or health care professional if they continue or are bothersome): -changes in taste -cough -dizziness -fever -headache -sensitivity to light This list may not describe all possible side effects. Call your doctor for medical advice about side effects. You may report side effects to FDA at 1-800-FDA-1088. Where should I keep my medicine? Keep out of the reach of children. Store at room temperature between 15 and 30 degrees C (59 and 86 degrees F). Protect from moisture. Keep container tightly closed. Throw away any unused medicine after the expiration date. NOTE: This sheet is a summary. It may not cover all possible information. If you have questions about this medicine, talk to your doctor, pharmacist, or health care provider.  2018 Elsevier/Gold Standard (2015-10-27 12:52:35)

## 2018-08-24 LAB — CUP PACEART INCLINIC DEVICE CHECK
Battery Remaining Longevity: 109 mo
Battery Voltage: 2.98 V
Brady Statistic RA Percent Paced: 0.03 %
Brady Statistic RV Percent Paced: 99.99 %
Date Time Interrogation Session: 20191204211231
Implantable Lead Implant Date: 20170608
Implantable Lead Implant Date: 20170608
Implantable Lead Location: 753859
Implantable Lead Location: 753860
Implantable Pulse Generator Implant Date: 20170608
Lead Channel Impedance Value: 412.5 Ohm
Lead Channel Impedance Value: 562.5 Ohm
Lead Channel Pacing Threshold Amplitude: 0.75 V
Lead Channel Pacing Threshold Amplitude: 0.75 V
Lead Channel Pacing Threshold Amplitude: 1 V
Lead Channel Pacing Threshold Amplitude: 1 V
Lead Channel Pacing Threshold Pulse Width: 0.4 ms
Lead Channel Pacing Threshold Pulse Width: 0.4 ms
Lead Channel Pacing Threshold Pulse Width: 0.4 ms
Lead Channel Pacing Threshold Pulse Width: 0.4 ms
Lead Channel Sensing Intrinsic Amplitude: 3.7 mV
Lead Channel Setting Pacing Amplitude: 2 V
Lead Channel Setting Pacing Amplitude: 2 V
Lead Channel Setting Pacing Pulse Width: 0.4 ms
Lead Channel Setting Sensing Sensitivity: 2.5 mV
Pulse Gen Model: 2272
Pulse Gen Serial Number: 7903881

## 2018-08-29 MED ORDER — SPIRONOLACTONE 25 MG PO TABS: 25.0000 mg | ORAL_TABLET | Freq: Every day | ORAL | 3 refills | Status: DC

## 2018-09-01 MED ORDER — SPIRONOLACTONE 50 MG PO TABS: 50.0000 mg | ORAL_TABLET | Freq: Every day | ORAL | 1 refills | Status: DC

## 2018-09-01 NOTE — Telephone Encounter (Signed)
Pt came w/ husband to his appt today w/ Camnitz. She and he discussed need for increase in Spironolactone. Rx sent to requested pharmacy

## 2018-09-06 ENCOUNTER — Other Ambulatory Visit: Payer: Self-pay | Admitting: Cardiology

## 2018-09-06 DIAGNOSIS — D2362 Other benign neoplasm of skin of left upper limb, including shoulder: Secondary | ICD-10-CM | POA: Diagnosis not present

## 2018-09-06 DIAGNOSIS — Z08 Encounter for follow-up examination after completed treatment for malignant neoplasm: Secondary | ICD-10-CM | POA: Diagnosis not present

## 2018-09-06 DIAGNOSIS — Z85828 Personal history of other malignant neoplasm of skin: Secondary | ICD-10-CM | POA: Diagnosis not present

## 2018-09-11 ENCOUNTER — Ambulatory Visit: Payer: BLUE CROSS/BLUE SHIELD | Admitting: Obstetrics and Gynecology

## 2018-09-11 MED ORDER — HYDROCHLOROTHIAZIDE 25 MG PO TABS: 25.0000 mg | ORAL_TABLET | Freq: Every day | ORAL | 3 refills | Status: DC

## 2018-09-11 NOTE — Addendum Note (Signed)
Addended by: Baird Lyons on: 09/11/2018 09:22 AM   Modules accepted: Orders

## 2018-09-14 ENCOUNTER — Ambulatory Visit (INDEPENDENT_AMBULATORY_CARE_PROVIDER_SITE_OTHER): Payer: BLUE CROSS/BLUE SHIELD

## 2018-09-14 DIAGNOSIS — I442 Atrioventricular block, complete: Secondary | ICD-10-CM | POA: Diagnosis not present

## 2018-09-14 NOTE — Progress Notes (Signed)
Remote pacemaker transmission.   

## 2018-09-16 LAB — CUP PACEART REMOTE DEVICE CHECK
Battery Remaining Longevity: 112 mo
Battery Remaining Percentage: 95.5 %
Battery Voltage: 2.98 V
Brady Statistic AP VP Percent: 1 %
Brady Statistic AP VS Percent: 1 %
Brady Statistic AS VP Percent: 99 %
Brady Statistic AS VS Percent: 1 %
Brady Statistic RA Percent Paced: 1 %
Brady Statistic RV Percent Paced: 99 %
Date Time Interrogation Session: 20191225070015
Implantable Lead Implant Date: 20170608
Implantable Lead Implant Date: 20170608
Implantable Lead Location: 753859
Implantable Lead Location: 753860
Implantable Pulse Generator Implant Date: 20170608
Lead Channel Impedance Value: 410 Ohm
Lead Channel Impedance Value: 540 Ohm
Lead Channel Pacing Threshold Amplitude: 0.75 V
Lead Channel Pacing Threshold Amplitude: 1 V
Lead Channel Pacing Threshold Pulse Width: 0.4 ms
Lead Channel Pacing Threshold Pulse Width: 0.4 ms
Lead Channel Sensing Intrinsic Amplitude: 12 mV
Lead Channel Sensing Intrinsic Amplitude: 4.1 mV
Lead Channel Setting Pacing Amplitude: 2 V
Lead Channel Setting Pacing Amplitude: 2 V
Lead Channel Setting Pacing Pulse Width: 0.4 ms
Lead Channel Setting Sensing Sensitivity: 2.5 mV
Pulse Gen Model: 2272
Pulse Gen Serial Number: 7903881

## 2018-09-25 ENCOUNTER — Encounter: Payer: Self-pay | Admitting: Obstetrics and Gynecology

## 2018-09-25 ENCOUNTER — Other Ambulatory Visit (HOSPITAL_COMMUNITY)
Admission: RE | Admit: 2018-09-25 | Discharge: 2018-09-25 | Disposition: A | Discharge status: Home / Self Care | Payer: BLUE CROSS/BLUE SHIELD | Source: Ambulatory Visit | Attending: Obstetrics and Gynecology | Admitting: Obstetrics and Gynecology

## 2018-09-25 ENCOUNTER — Ambulatory Visit (INDEPENDENT_AMBULATORY_CARE_PROVIDER_SITE_OTHER): Payer: BLUE CROSS/BLUE SHIELD | Admitting: Obstetrics and Gynecology

## 2018-09-25 VITALS — BP 144/88 | Ht 63.0 in | Wt 207.0 lb

## 2018-09-25 DIAGNOSIS — Z01419 Encounter for gynecological examination (general) (routine) without abnormal findings: Secondary | ICD-10-CM

## 2018-09-25 DIAGNOSIS — Z124 Encounter for screening for malignant neoplasm of cervix: Secondary | ICD-10-CM

## 2018-09-25 DIAGNOSIS — Z1339 Encounter for screening examination for other mental health and behavioral disorders: Secondary | ICD-10-CM

## 2018-09-25 DIAGNOSIS — Z1331 Encounter for screening for depression: Secondary | ICD-10-CM

## 2018-09-25 NOTE — Progress Notes (Signed)
Routine Annual Gynecology Examination   PCP: [name], MD  Chief Complaint  Patient presents with  . Annual Exam   History of Present Illness: Patient is a 58 y.o. G1P0101 presents for annual exam. The patient has no complaints today.   Menopausal bleeding: denies  Menopausal symptoms: denies  Breast symptoms: denies  Last pap smear: 5 years ago.  Result Normal, distant history of abnormal. S/p cryotherapy.  Last mammogram:  5 years ago.  Result Normal  Past Medical History:  Diagnosis Date  . Anxiety   . Hyperlipidemia   . Hypertension   . Interstitial lung disease (HCC)   . Pollen allergies   . Squamous cell carcinoma 2014   skin cancer  . Subclinical hyperthyroidism 06/17/2014    Past Surgical History:  Procedure Laterality Date  . COSMETIC SURGERY    . EP IMPLANTABLE DEVICE N/A 02/26/2016   Procedure: Pacemaker Implant;  Surgeon: Will Jorja Loa, MD;  Location: [facility];  Service: Cardiovascular;  Laterality: N/A;  . TUBAL LIGATION      Prior to Admission medications   Medication Sig Start Date End Date Taking? Authorizing Provider  carvedilol (COREG) 25 MG tablet TAKE ONE TABLET BY MOUTH TWICE A DAY WITH MEALS 09/06/18   Camnitz, Andree Coss, MD  cholecalciferol (VITAMIN D) 1000 units tablet Take 1,000 Units by mouth daily.    [provider]  diazepam (VALIUM) 5 MG tablet TAKE ONE TABLET BY MOUTH DAILY AS NEEDED FOR ANXIETY 07/17/18   Sherlene Shams, MD  hydrochlorothiazide (HYDRODIURIL) 25 MG tablet Take 1 tablet (25 mg total) by mouth daily. 09/11/18 12/10/18  Camnitz, Will Daphine Deutscher, MD  montelukast (SINGULAIR) 10 MG tablet Take 1 tablet (10 mg total) by mouth at bedtime. 06/26/18   Sherlene Shams, MD  nortriptyline (PAMELOR) 25 MG capsule Take 1 capsule by mouth daily. 08/01/18   [provider]  spironolactone (ALDACTONE) 50 MG tablet Take 1 tablet (50 mg total) by mouth daily. 09/01/18 11/30/18  Regan Lemming, MD     Allergies  Allergen Reactions  . Codeine     REACTION: hives, itching  . Losartan Potassium Other (See Comments)  . Neomycin-Polymyxin-Dexameth     Swelling and itching of eye   . Oxycodone-Acetaminophen     REACTION: unspecified  . Paroxetine   . Sulfa Antibiotics Hives    Other reaction(s): RASH Other reaction(s): RASH   . Sulfonamide Derivatives     REACTION: unspecified  . Lisinopril Rash    Obstetric History: G1P0101  Social History   Socioeconomic History  . Marital status: Married    Spouse name: Not on file  . Number of children: 1  . Years of education: Not on file  . Highest education level: Not on file  Occupational History  . Occupation: house wife    Employer: [employer]  Social Needs  . Financial resource strain: Not on file  . Food insecurity:    Worry: Not on file    Inability: Not on file  . Transportation needs:    Medical: Not on file    Non-medical: Not on file  Tobacco Use  . Smoking status: Former Games developer  . Smokeless tobacco: Never Used  Substance and Sexual Activity  . Alcohol use: No  . Drug use: No  . Sexual activity: Yes    Birth control/protection: Post-menopausal  Lifestyle  . Physical activity:    Days per week: Not on file    Minutes per session:  Not on file  . Stress: Not on file  Relationships  . Social connections:    Talks on phone: Not on file    Gets together: Not on file    Attends religious service: Not on file    Active member of club or organization: Not on file    Attends meetings of clubs or organizations: Not on file    Relationship status: Not on file  . Intimate partner violence:    Fear of current or ex partner: Not on file    Emotionally abused: Not on file    Physically abused: Not on file    Forced sexual activity: Not on file  Other Topics Concern  . Not on file  Social History Narrative   Regular exercise-no    Family History  Problem Relation Age of Onset  . Anxiety disorder Mother   .  Hypertension Mother   . Cancer Maternal Aunt        breast  . Heart failure Maternal Grandfather     Review of Systems  Constitutional: Negative.   HENT: Negative.   Eyes: Negative.   Respiratory: Negative.   Cardiovascular: Negative.   Gastrointestinal: Negative.   Genitourinary: Negative.   Musculoskeletal: Negative.   Skin: Negative.   Neurological: Negative.   Psychiatric/Behavioral: Negative.      Physical Exam Vitals: BP (!) 144/88   Ht 5\' 3"  (1.6 m)   Wt 207 lb (93.9 kg)   BMI 36.67 kg/m   Physical Exam Constitutional:      General: She is not in acute distress.    Appearance: She is well-developed.  Genitourinary:     Pelvic exam was performed with patient supine.     Vulva, inguinal canal, urethra and uterus normal.     No posterior fourchette injury or lesion present.     No inguinal adenopathy present in the right or left side.    No signs of injury in the vagina.     No vaginal discharge, erythema, tenderness or bleeding.     No cervical motion tenderness, discharge, lesion or polyp.     Uterus is mobile.     Uterus is not enlarged or tender.     No uterine mass detected.    Uterus is anteverted.     No right or left adnexal mass present.     Right adnexa not tender or full.     Left adnexa not tender or full.     Genitourinary Comments: Exam severely by body habitus  HENT:     Head: Normocephalic and atraumatic.  Eyes:     General: No scleral icterus. Neck:     Musculoskeletal: Normal range of motion and neck supple.     Thyroid: No thyromegaly.  Cardiovascular:     Rate and Rhythm: Normal rate and regular rhythm.     Heart sounds: No murmur. No friction rub. No gallop.   Pulmonary:     Effort: Pulmonary effort is normal. No respiratory distress.     Breath sounds: Normal breath sounds. No wheezing or rales.  Chest:     Breasts:        Right: No inverted nipple, mass, nipple discharge, skin change or tenderness.        Left: No inverted  nipple, mass, nipple discharge, skin change or tenderness.  Abdominal:     General: Bowel sounds are normal. There is no distension.     Palpations: Abdomen is soft. There is no mass.  Tenderness: There is no abdominal tenderness. There is no guarding or rebound.  Musculoskeletal: Normal range of motion.        General: No tenderness.  Lymphadenopathy:     Cervical: No cervical adenopathy.     Lower Body: No right inguinal adenopathy. No left inguinal adenopathy.  Neurological:     Mental Status: She is alert and oriented to person, place, and time.     Cranial Nerves: No cranial nerve deficit.  Skin:    General: Skin is warm and dry.     Findings: No erythema or rash.  Psychiatric:        Mood and Affect: Mood normal.        Behavior: Behavior normal.        Judgment: Judgment normal.      Female chaperone present for pelvic and breast  portions of the physical exam  Results: AUDIT Questionnaire (screen for alcoholism): 5 PHQ-9: 3   Assessment and Plan:  58 y.o. 751P0101 female here for routine annual gynecologic examination  Plan: Problem List Items Addressed This Visit    None    Visit Diagnoses    Women's annual routine gynecological examination    -  Primary   Relevant Orders   Cytology - PAP   Screening for depression       Screening for alcoholism       Pap smear for cervical cancer screening       Relevant Orders   Cytology - PAP      Screening: -- Blood pressure screen managed by PCP -- Colonoscopy - per PCP -- Mammogram - due. Patient refuses hers on the grounds that she has a pacemaker and doesn't want to mess that up. We discussed in detail and she still refuses -- Weight screening: obese: discussed management options, including lifestyle, dietary, and exercise. -- Depression screening negative (PHQ-9) -- Nutrition: normal -- cholesterol screening: per PCP -- osteoporosis screening: not due -- tobacco screening: not using -- alcohol screening:  AUDIT questionnaire indicates low-risk usage. -- family history of breast cancer screening: done. not at high risk. -- no evidence of domestic violence or intimate partner violence. -- STD screening: gonorrhea/chlamydia NAAT not collected per patient request. -- pap smear collected per ASCCP guidelines -- flu vaccine received -- HPV vaccination series: not eligilbe   Thomasene MohairStephen Alma Mohiuddin, MD 09/25/2018 1:52 PM

## 2018-09-27 LAB — CYTOLOGY - PAP
Diagnosis: NEGATIVE
HPV: NOT DETECTED

## 2018-10-01 DIAGNOSIS — Z950 Presence of cardiac pacemaker: Secondary | ICD-10-CM | POA: Insufficient documentation

## 2018-10-01 NOTE — Progress Notes (Signed)
Dr. Karleen Hampshire T. Ingri Diemer, MD, CAQ [facility] [address] [place], [address] Phone: [phone] Fax: [phone]  10/05/2018  Patient: Anne Rush, MRN: [id number], DOB: September 19, 1961, 58 y.o.  Primary Physician:  [name], MD   Chief Complaint  Patient presents with  . Re-Establish Care   Subjective:   Anne Rush is a 58 y.o. very pleasant female patient who presents with the following:  Anne Rush is a well-known patient.  She had been my patient for a number of years, and ultimately she transferred to our [place] office and has been seeing Dr. Darrick Huntsman for a couple of years.  I know her and her family very well, and she asked to transfer back into my medical practice.  She is here today to reestablish care.  She does have a history of complete heart block which I diagnosed in June 2017, and this was in the office and she later that day was outfitted with a pacemaker.  She also has pulmonary fibrosis and previously was being followed at [facility].  She also is a former smoker.  She also has some high blood pressure as well as hyperlipidemia.  She also does have some generalized anxiety.  Not taking some aldactone.   121 / 85 average.  Appears to elevated a lot with elevation in the office and discourse between various MD's regarding her BP meds.   BP Readings from Last 3 Encounters:  10/05/18 (!) 150/90  09/25/18 (!) 144/88  08/23/18 (!) 138/98     Past Medical History, Surgical History, Social History, Family History, Problem List, Medications, and Allergies have been reviewed and updated if relevant.  Patient Active Problem List   Diagnosis Date Noted  . Pacemaker 10/01/2018  . Vitamin D deficiency 01/11/2018  . Allergic angioedema 03/17/2016  . Complete heart block (HCC) 02/26/2016  . Obesity (BMI 30-39.9) 04/28/2014  . Postinflammatory pulmonary fibrosis (HCC) 08/27/2010  . Former tobacco use 08/28/2008  .  Hyperlipidemia LDL goal <100 06/21/2007  . GAD (generalized anxiety disorder) 06/05/2007  . Essential hypertension 06/05/2007  . ALLERGIC RHINITIS 06/05/2007    Past Medical History:  Diagnosis Date  . Anxiety   . Hyperlipidemia   . Hypertension   . Interstitial lung disease (HCC)   . Pollen allergies   . Squamous cell carcinoma 2014   skin cancer  . Subclinical hyperthyroidism 06/17/2014    Past Surgical History:  Procedure Laterality Date  . COSMETIC SURGERY    . EP IMPLANTABLE DEVICE N/A 02/26/2016   Procedure: Pacemaker Implant;  Surgeon: Will Jorja Loa, MD;  Location: [facility];  Service: Cardiovascular;  Laterality: N/A;  . TUBAL LIGATION      Social History   Socioeconomic History  . Marital status: Married    Spouse name: Not on file  . Number of children: 1  . Years of education: Not on file  . Highest education level: Not on file  Occupational History  . Occupation: house wife    Employer: [employer]  Social Needs  . Financial resource strain: Not on file  . Food insecurity:    Worry: Not on file    Inability: Not on file  . Transportation needs:    Medical: Not on file    Non-medical: Not on file  Tobacco Use  . Smoking status: Former Games developer  . Smokeless tobacco: Never Used  Substance and Sexual Activity  . Alcohol use: No  . Drug use:  No  . Sexual activity: Yes    Birth control/protection: Post-menopausal  Lifestyle  . Physical activity:    Days per week: Not on file    Minutes per session: Not on file  . Stress: Not on file  Relationships  . Social connections:    Talks on phone: Not on file    Gets together: Not on file    Attends religious service: Not on file    Active member of club or organization: Not on file    Attends meetings of clubs or organizations: Not on file    Relationship status: Not on file  . Intimate partner violence:    Fear of current or ex partner: Not on file    Emotionally abused: Not on file     Physically abused: Not on file    Forced sexual activity: Not on file  Other Topics Concern  . Not on file  Social History Narrative   Regular exercise-no    Family History  Problem Relation Age of Onset  . Anxiety disorder Mother   . Hypertension Mother   . Cancer Maternal Aunt        breast  . Heart failure Maternal Grandfather     Allergies  Allergen Reactions  . Codeine     REACTION: hives, itching  . Losartan Potassium Other (See Comments)  . Neomycin-Polymyxin-Dexameth     Swelling and itching of eye   . Oxycodone-Acetaminophen     REACTION: unspecified  . Paroxetine   . Sulfa Antibiotics Hives    Other reaction(s): RASH Other reaction(s): RASH   . Sulfonamide Derivatives     REACTION: unspecified  . Lisinopril Rash    Medication list reviewed and updated in full in [facility].   GEN: No acute illnesses, no fevers, chills. GI: No n/v/d, eating normally Pulm: No SOB Interactive and getting along well at home.  Otherwise, ROS is as per the HPI.  Objective:   BP (!) 150/90   Pulse 89   Temp 98.3 F (36.8 C) (Oral)   Ht 5' 2.75" (1.594 m)   Wt 201 lb 12 oz (91.5 kg)   BMI 36.02 kg/m   GEN: WDWN, NAD, Non-toxic, A & O x 3 HEENT: Atraumatic, Normocephalic. Neck supple. No masses, No LAD. Ears and Nose: No external deformity. CV: RRR, No M/G/R. No JVD. No thrill. No extra heart sounds. PULM: CTA B, no wheezes, crackles, rhonchi. No retractions. No resp. distress. No accessory muscle use. EXTR: No c/c/e NEURO Normal gait.  PSYCH: Normally interactive. Conversant. Not depressed or anxious appearing.  Calm demeanor.   Laboratory and Imaging Data: Lab Results  Component Value Date   WBC 6.5 01/09/2018   HGB 15.1 (H) 01/09/2018   HCT 42.7 01/09/2018   PLT 247.0 01/09/2018   GLUCOSE 108 (H) 06/26/2018   CHOL 242 (H) 06/26/2018   TRIG 261.0 (H) 06/26/2018   HDL 46.30 06/26/2018   LDLDIRECT 166.0 06/26/2018   LDLCALC 149 (H) 05/12/2015   ALT  14 06/26/2018   AST 16 06/26/2018   NA 138 06/26/2018   K 4.4 06/26/2018   CL 99 06/26/2018   CREATININE 0.63 06/26/2018   BUN 9 06/26/2018   CO2 31 06/26/2018   TSH 2.61 01/09/2018   INR 1.0 10/12/2010   HGBA1C 5.8 01/09/2018     Assessment and Plan:   Essential hypertension  Postinflammatory pulmonary fibrosis (HCC)  GAD (generalized anxiety disorder)  Hyperlipidemia LDL goal <100  Former tobacco use  Complete heart block (HCC)  Pacemaker  I have convinced her to go on spironalactone 25 mg in addition to her coreg and hctz.  She has always been highly reluctant to use prescription medications.   PF likely cause of mild secondary polycythemia going back 8 years - reviewed with her.   Not at goal with HLD - she does not want to take medication.  Follow-up: Return in about 6 months (around 04/05/2019) for Complete physical .  Meds ordered this encounter  Medications  . spironolactone (ALDACTONE) 25 MG tablet    Sig: Take 1 tablet (25 mg total) by mouth daily.    Dispense:  90 tablet    Refill:  1   No orders of the defined types were placed in this encounter.   Signed,  Elpidio GaleaSpencer T. Ariella Voit, MD  Patient's Medications  New Prescriptions   No medications on file  Previous Medications   CARVEDILOL (COREG) 25 MG TABLET    TAKE ONE TABLET BY MOUTH TWICE A DAY WITH MEALS   CHOLECALCIFEROL (VITAMIN D) 1000 UNITS TABLET    Take 1,000 Units by mouth daily.   DIAZEPAM (VALIUM) 5 MG TABLET    TAKE ONE TABLET BY MOUTH DAILY AS NEEDED FOR ANXIETY   HYDROCHLOROTHIAZIDE (HYDRODIURIL) 25 MG TABLET    Take 1 tablet (25 mg total) by mouth daily.   NORTRIPTYLINE (PAMELOR) 25 MG CAPSULE    Take 1 capsule by mouth daily.  Modified Medications   Modified Medication Previous Medication   SPIRONOLACTONE (ALDACTONE) 25 MG TABLET spironolactone (ALDACTONE) 50 MG tablet      Take 1 tablet (25 mg total) by mouth daily.    Take 1 tablet (50 mg total) by mouth daily.  Discontinued  Medications   MONTELUKAST (SINGULAIR) 10 MG TABLET    Take 1 tablet (10 mg total) by mouth at bedtime.

## 2018-10-05 ENCOUNTER — Encounter: Payer: BLUE CROSS/BLUE SHIELD | Admitting: Family Medicine

## 2018-10-05 ENCOUNTER — Encounter: Payer: Self-pay | Admitting: Family Medicine

## 2018-10-05 ENCOUNTER — Ambulatory Visit: Payer: BLUE CROSS/BLUE SHIELD | Admitting: Family Medicine

## 2018-10-05 VITALS — BP 150/90 | HR 89 | Temp 98.3°F | Ht 62.75 in | Wt 201.8 lb

## 2018-10-05 DIAGNOSIS — E785 Hyperlipidemia, unspecified: Secondary | ICD-10-CM

## 2018-10-05 DIAGNOSIS — Z950 Presence of cardiac pacemaker: Secondary | ICD-10-CM

## 2018-10-05 DIAGNOSIS — J8410 Pulmonary fibrosis, unspecified: Secondary | ICD-10-CM | POA: Diagnosis not present

## 2018-10-05 DIAGNOSIS — Z87891 Personal history of nicotine dependence: Secondary | ICD-10-CM

## 2018-10-05 DIAGNOSIS — I10 Essential (primary) hypertension: Secondary | ICD-10-CM

## 2018-10-05 DIAGNOSIS — F411 Generalized anxiety disorder: Secondary | ICD-10-CM

## 2018-10-05 DIAGNOSIS — I442 Atrioventricular block, complete: Secondary | ICD-10-CM

## 2018-10-05 MED ORDER — SPIRONOLACTONE 25 MG PO TABS: 25.0000 mg | ORAL_TABLET | Freq: Every day | ORAL | 1 refills | Status: DC

## 2018-12-14 ENCOUNTER — Other Ambulatory Visit: Payer: Self-pay

## 2018-12-14 ENCOUNTER — Ambulatory Visit (INDEPENDENT_AMBULATORY_CARE_PROVIDER_SITE_OTHER): Payer: BLUE CROSS/BLUE SHIELD | Admitting: *Deleted

## 2018-12-14 DIAGNOSIS — I442 Atrioventricular block, complete: Secondary | ICD-10-CM

## 2018-12-14 LAB — CUP PACEART REMOTE DEVICE CHECK
Battery Remaining Longevity: 113 mo
Battery Remaining Percentage: 95.5 %
Battery Voltage: 2.98 V
Brady Statistic AP VP Percent: 1 %
Brady Statistic AP VS Percent: 1 %
Brady Statistic AS VP Percent: 99 %
Brady Statistic AS VS Percent: 1 %
Brady Statistic RA Percent Paced: 1 %
Brady Statistic RV Percent Paced: 99 %
Date Time Interrogation Session: 20200325083015
Implantable Lead Implant Date: 20170608
Implantable Lead Implant Date: 20170608
Implantable Lead Location: 753859
Implantable Lead Location: 753860
Implantable Pulse Generator Implant Date: 20170608
Lead Channel Impedance Value: 440 Ohm
Lead Channel Impedance Value: 560 Ohm
Lead Channel Pacing Threshold Amplitude: 0.75 V
Lead Channel Pacing Threshold Amplitude: 1 V
Lead Channel Pacing Threshold Pulse Width: 0.4 ms
Lead Channel Pacing Threshold Pulse Width: 0.4 ms
Lead Channel Sensing Intrinsic Amplitude: 12 mV
Lead Channel Sensing Intrinsic Amplitude: 3.9 mV
Lead Channel Setting Pacing Amplitude: 2 V
Lead Channel Setting Pacing Amplitude: 2 V
Lead Channel Setting Pacing Pulse Width: 0.4 ms
Lead Channel Setting Sensing Sensitivity: 2.5 mV
Pulse Gen Model: 2272
Pulse Gen Serial Number: 7903881

## 2018-12-19 ENCOUNTER — Encounter: Payer: Self-pay | Admitting: Cardiology

## 2018-12-19 NOTE — Progress Notes (Signed)
Remote pacemaker transmission.   

## 2019-03-15 ENCOUNTER — Ambulatory Visit (INDEPENDENT_AMBULATORY_CARE_PROVIDER_SITE_OTHER): Payer: BC Managed Care – PPO | Admitting: *Deleted

## 2019-03-15 DIAGNOSIS — I442 Atrioventricular block, complete: Secondary | ICD-10-CM

## 2019-03-15 LAB — CUP PACEART REMOTE DEVICE CHECK
Battery Remaining Longevity: 113 mo
Battery Remaining Percentage: 95.5 %
Battery Voltage: 2.98 V
Brady Statistic AP VP Percent: 1 %
Brady Statistic AP VS Percent: 1 %
Brady Statistic AS VP Percent: 99 %
Brady Statistic AS VS Percent: 1 %
Brady Statistic RA Percent Paced: 1 %
Brady Statistic RV Percent Paced: 99 %
Date Time Interrogation Session: 20200624060014
Implantable Lead Implant Date: 20170608
Implantable Lead Implant Date: 20170608
Implantable Lead Location: 753859
Implantable Lead Location: 753860
Implantable Pulse Generator Implant Date: 20170608
Lead Channel Impedance Value: 440 Ohm
Lead Channel Impedance Value: 560 Ohm
Lead Channel Sensing Intrinsic Amplitude: 4.2 mV
Lead Channel Setting Pacing Amplitude: 2 V
Lead Channel Setting Pacing Amplitude: 2 V
Lead Channel Setting Pacing Pulse Width: 0.4 ms
Lead Channel Setting Sensing Sensitivity: 2.5 mV
Pulse Gen Model: 2272
Pulse Gen Serial Number: 7903881

## 2019-03-23 ENCOUNTER — Encounter: Payer: Self-pay | Admitting: Cardiology

## 2019-03-23 NOTE — Progress Notes (Signed)
Remote pacemaker transmission.   

## 2019-04-10 DIAGNOSIS — L820 Inflamed seborrheic keratosis: Secondary | ICD-10-CM | POA: Diagnosis not present

## 2019-04-10 DIAGNOSIS — L538 Other specified erythematous conditions: Secondary | ICD-10-CM | POA: Diagnosis not present

## 2019-04-11 ENCOUNTER — Other Ambulatory Visit: Payer: Self-pay | Admitting: Family Medicine

## 2019-04-11 DIAGNOSIS — E559 Vitamin D deficiency, unspecified: Secondary | ICD-10-CM

## 2019-04-11 DIAGNOSIS — Z131 Encounter for screening for diabetes mellitus: Secondary | ICD-10-CM

## 2019-04-11 DIAGNOSIS — Z0000 Encounter for general adult medical examination without abnormal findings: Secondary | ICD-10-CM

## 2019-04-13 ENCOUNTER — Other Ambulatory Visit: Payer: Self-pay

## 2019-04-13 ENCOUNTER — Other Ambulatory Visit (INDEPENDENT_AMBULATORY_CARE_PROVIDER_SITE_OTHER): Payer: BC Managed Care – PPO

## 2019-04-13 DIAGNOSIS — Z0000 Encounter for general adult medical examination without abnormal findings: Secondary | ICD-10-CM

## 2019-04-13 DIAGNOSIS — Z131 Encounter for screening for diabetes mellitus: Secondary | ICD-10-CM | POA: Diagnosis not present

## 2019-04-13 DIAGNOSIS — E559 Vitamin D deficiency, unspecified: Secondary | ICD-10-CM

## 2019-04-13 LAB — CBC WITH DIFFERENTIAL/PLATELET
Basophils Absolute: 0.1 10*3/uL (ref 0.0–0.1)
Basophils Relative: 0.9 % (ref 0.0–3.0)
Eosinophils Absolute: 0.1 10*3/uL (ref 0.0–0.7)
Eosinophils Relative: 1.3 % (ref 0.0–5.0)
HCT: 41 % (ref 36.0–46.0)
Hemoglobin: 14.2 g/dL (ref 12.0–15.0)
Lymphocytes Relative: 36.3 % (ref 12.0–46.0)
Lymphs Abs: 2.1 10*3/uL (ref 0.7–4.0)
MCHC: 34.6 g/dL (ref 30.0–36.0)
MCV: 92.2 fl (ref 78.0–100.0)
Monocytes Absolute: 0.5 10*3/uL (ref 0.1–1.0)
Monocytes Relative: 9 % (ref 3.0–12.0)
Neutro Abs: 3 10*3/uL (ref 1.4–7.7)
Neutrophils Relative %: 52.5 % (ref 43.0–77.0)
Platelets: 236 10*3/uL (ref 150.0–400.0)
RBC: 4.45 Mil/uL (ref 3.87–5.11)
RDW: 12.6 % (ref 11.5–15.5)
WBC: 5.7 10*3/uL (ref 4.0–10.5)

## 2019-04-13 LAB — BASIC METABOLIC PANEL
BUN: 12 mg/dL (ref 6–23)
CO2: 30 mEq/L (ref 19–32)
Calcium: 9.8 mg/dL (ref 8.4–10.5)
Chloride: 99 mEq/L (ref 96–112)
Creatinine, Ser: 0.63 mg/dL (ref 0.40–1.20)
GFR: 97.63 mL/min (ref >60.00)
Glucose, Bld: 99 mg/dL (ref 70–99)
Potassium: 3.9 mEq/L (ref 3.5–5.1)
Sodium: 137 mEq/L (ref 135–145)

## 2019-04-13 LAB — LIPID PANEL
Cholesterol: 226 mg/dL — ABNORMAL HIGH (ref 0–200)
HDL: 48.5 mg/dL (ref >39.00)
NonHDL: 177.57
Total CHOL/HDL Ratio: 5
Triglycerides: 273 mg/dL — ABNORMAL HIGH (ref 0.0–149.0)
VLDL: 54.6 mg/dL — ABNORMAL HIGH (ref 0.0–40.0)

## 2019-04-13 LAB — HEPATIC FUNCTION PANEL
ALT: 20 U/L (ref 0–35)
AST: 19 U/L (ref 0–37)
Albumin: 4.6 g/dL (ref 3.5–5.2)
Alkaline Phosphatase: 42 U/L (ref 39–117)
Bilirubin, Direct: 0.1 mg/dL (ref 0.0–0.3)
Total Bilirubin: 0.6 mg/dL (ref 0.2–1.2)
Total Protein: 7.4 g/dL (ref 6.0–8.3)

## 2019-04-13 LAB — HEMOGLOBIN A1C: Hgb A1c MFr Bld: 5.9 % (ref 4.6–6.5)

## 2019-04-13 LAB — VITAMIN D 25 HYDROXY (VIT D DEFICIENCY, FRACTURES): VITD: 27.26 ng/mL — ABNORMAL LOW (ref 30.00–100.00)

## 2019-04-13 LAB — LDL CHOLESTEROL, DIRECT: Direct LDL: 160 mg/dL

## 2019-04-13 NOTE — Progress Notes (Signed)
Anne Rush T. Khara Renaud, MD Primary Care and Sports Medicine [facility] [address] [place], [id number] Phone: [phone]  FAX: [phone]  Anne PatesLisa J Rush - 58 y.o. female  MRN [id number]  Date of Birth: December 29, 1960  Visit Date: 04/16/2019  PCP: Hannah Beatopland, Edwen Mclester, MD  Referred by: Hannah Beatopland, Danuta Huseman, MD  Chief Complaint  Patient presents with  . Annual Exam   Patient Care Team: Hannah Beatopland, Kinzi Frediani, MD as PCP - General (Family Medicine) Subjective:   Anne Rush is a 58 y.o. pleasant patient who presents with the following:  Health Maintenance Summary Reviewed and updated, unless pt declines services.  Tobacco History Reviewed. Non-smoker Alcohol: No concerns, no excessive use Exercise Habits: Some activity, rec at least 30 mins 5 times a week STD concerns: none Drug Use: None Birth control method: n/a Menses regular: n/a Lumps or breast concerns: no Breast Cancer Family History: no  L eye - ?  Colon?  Does not want to do it. Would never want to do it.  Mammo:  Dr. Jean RosenthalJackson did good exam  The 10-year ASCVD risk score Denman George(Goff DC Montez HagemanJr., et al., 2013) is: 4.4%   Values used to calculate the score:     Age: [age] years     Sex: Female     Is Non-Hispanic African American: No     Diabetic: No     Tobacco smoker: No     Systolic Blood Pressure: 140 mmHg     Is BP treated: Yes     HDL Cholesterol: 48.5 mg/dL     Total Cholesterol: 226 mg/dL    Health Maintenance  Topic Date Due  . Hepatitis C Screening  0April 10, 1962  . HIV Screening  12/09/1977  . COLONOSCOPY  12/09/2012  . MAMMOGRAM  09/26/2019 (Originally 04/28/2016)  . INFLUENZA VACCINE  04/21/2019  . PAP SMEAR-Modifier  09/25/2021  . TETANUS/TDAP  10/28/2024    Immunization History  Administered Date(s) Administered  . Influenza Nasal 06/20/2017  . Influenza Whole 07/04/2008, 06/18/2009, 08/27/2010  . Influenza, Seasonal, Injecte, Preservative Fre 09/03/2010, 06/22/2011,  06/27/2012  . Influenza,inj,Quad PF,6+ Mos 07/21/2015, 07/06/2018  . Influenza-Unspecified 06/27/2012, 07/17/2014, 07/19/2016, 07/19/2017  . Td 05/10/2002  . Tdap 10/28/2014   Patient Active Problem List   Diagnosis Date Noted  . Pacemaker 10/01/2018  . Vitamin D deficiency 01/11/2018  . Allergic angioedema 03/17/2016  . Complete heart block (HCC) 02/26/2016  . Obesity (BMI 30-39.9) 04/28/2014  . Postinflammatory pulmonary fibrosis (HCC) 08/27/2010  . Former tobacco use 08/28/2008  . Hyperlipidemia LDL goal <100 06/21/2007  . GAD (generalized anxiety disorder) 06/05/2007  . Essential hypertension 06/05/2007  . ALLERGIC RHINITIS 06/05/2007   Past Medical History:  Diagnosis Date  . Anxiety   . Hyperlipidemia   . Hypertension   . Interstitial lung disease (HCC)   . Pollen allergies   . Squamous cell carcinoma 2014   skin cancer  . Subclinical hyperthyroidism 06/17/2014   Past Surgical History:  Procedure Laterality Date  . COSMETIC SURGERY    . EP IMPLANTABLE DEVICE N/A 02/26/2016   Procedure: Pacemaker Implant;  Surgeon: Will Jorja LoaMartin Camnitz, MD;  [facility];  Service: Cardiovascular;  Laterality: N/A;  . TUBAL LIGATION     Social History   Socioeconomic History  . Marital status: Married    Spouse name: Not on file  . Number of children: 1  . Years of education: Not on file  . Highest education level: Not on  file  Occupational History  . Occupation: house wife    Employer: [employer]  Social Needs  . Financial resource strain: Not on file  . Food insecurity    Worry: Not on file    Inability: Not on file  . Transportation needs    Medical: Not on file    Non-medical: Not on file  Tobacco Use  . Smoking status: Former Research scientist (life sciences)  . Smokeless tobacco: Never Used  Substance and Sexual Activity  . Alcohol use: No  . Drug use: No  . Sexual activity: Yes    Birth control/protection: Post-menopausal  Lifestyle  . Physical activity    Days per week:  Not on file    Minutes per session: Not on file  . Stress: Not on file  Relationships  . Social Herbalist on phone: Not on file    Gets together: Not on file    Attends religious service: Not on file    Active member of club or organization: Not on file    Attends meetings of clubs or organizations: Not on file    Relationship status: Not on file  . Intimate partner violence    Fear of current or ex partner: Not on file    Emotionally abused: Not on file    Physically abused: Not on file    Forced sexual activity: Not on file  Other Topics Concern  . Not on file  Social History Narrative   Regular exercise-no   Family History  Problem Relation Age of Onset  . Anxiety disorder Mother   . Hypertension Mother   . Cancer Maternal Aunt        breast  . Heart failure Maternal Grandfather    Allergies  Allergen Reactions  . Codeine     REACTION: hives, itching  . Losartan Potassium Other (See Comments)  . Neomycin-Polymyxin-Dexameth     Swelling and itching of eye   . Oxycodone-Acetaminophen     REACTION: unspecified  . Paroxetine   . Sulfa Antibiotics Hives    Other reaction(s): RASH Other reaction(s): RASH   . Sulfonamide Derivatives     REACTION: unspecified  . Lisinopril Rash    Medication list has been reviewed and updated.   General: Denies fever, chills, sweats. No significant weight loss. Eyes: Denies blurring,significant itching, about topical change on L eye ENT: Denies earache, sore throat, and hoarseness.  Cardiovascular: Denies chest pains, palpitations, dyspnea on exertion,  Respiratory: Denies cough, dyspnea at rest,wheeezing Breast: no concerns about lumps GI: Denies nausea, vomiting, diarrhea, constipation, change in bowel habits, abdominal pain, melena, hematochezia GU: Denies dysuria, hematuria, urinary hesitancy, nocturia, denies STD risk, no concerns about discharge Musculoskeletal: Denies back pain, joint pain Derm: Denies rash,  itching Neuro: Denies  paresthesias, frequent falls, frequent headaches Psych: Denies depression, anxiety Endocrine: Denies cold intolerance, heat intolerance, polydipsia Heme: Denies enlarged lymph nodes Allergy: No hayfever  Objective:   BP (!) 140/100   Pulse 90   Temp 98.2 F (36.8 C) (Temporal)   Ht 5' 2.75" (1.594 m)   Wt 205 lb 12 oz (93.3 kg)   BMI 36.74 kg/m  Ideal Body Weight: Weight in (lb) to have BMI = 25: 139.7 No exam data present Depression screen [facility] 2/9 04/16/2019 04/12/2018 05/17/2017 12/13/2016  Decreased Interest 0 0 0 0  Down, Depressed, Hopeless 0 0 0 0  PHQ - 2 Score 0 0 0 0     GEN: well developed, well nourished, no  acute distress Eyes: Left medial eye, small elevated area ENT: TM clear, nares clear, oral exam WNL Neck: supple, no lymphadenopathy, no thyromegaly, no JVD Pulm: clear to auscultation and percussion, respiratory effort normal CV: regular rate and rhythm, S1-S2, no murmur, rub or gallop, no bruits Chest: no scars, masses, no lumps BREAST: breast exam declined GI: soft, non-tender; no hepatosplenomegaly, masses; active bowel sounds all quadrants GU: GU exam declined Lymph: no cervical, axillary or inguinal adenopathy MSK: gait normal, muscle tone and strength WNL, no joint swelling, effusions, discoloration, crepitus  SKIN: clear, good turgor, color WNL, no rashes, lesions, or ulcerations Neuro: normal mental status, normal strength, sensation, and motion Psych: alert; oriented to person, place and time, normally interactive and not anxious or depressed in appearance.   All labs reviewed with patient.  Assessment and Plan:     ICD-10-CM   1. Healthcare maintenance  Z00.00    Terence is doing OK.  She declines colon ca screening and mammo  The patient declines routine health maintenance services noted. We reviewed that could lead to missing significant problems that could affect there mortality. The patient indicated that they  understood this and was willing to accept those risks.   Health Maintenance Exam: The patient's preventative maintenance and recommended screening tests for an annual wellness exam were reviewed in full today. Brought up to date unless services declined.  Counselled on the importance of diet, exercise, and its role in overall health and mortality. The patient's FH and SH was reviewed, including their home life, tobacco status, and drug and alcohol status.  Follow-up in 1 year for physical exam or additional follow-up below.  Follow-up: No follow-ups on file. Or follow-up in 1 year if not noted.  Future Appointments  Date Time Provider Department Center  06/14/2019  7:35 AM [facility] DEVICE REMOTES [facility]  09/13/2019  7:35 AM [facility] DEVICE REMOTES [facility]  12/13/2019  7:35 AM [facility] DEVICE REMOTES [facility]  03/13/2020  7:35 AM [facility] DEVICE REMOTES [facility]  06/12/2020  7:35 AM [facility] DEVICE REMOTES [facility]    No orders of the defined types were placed in this encounter.  Medications Discontinued During This Encounter  Medication Reason  . cholecalciferol (VITAMIN D) 1000 units tablet Patient Preference   No orders of the defined types were placed in this encounter.   Signed,  Elpidio Galea. Bassel Gaskill, MD   Allergies as of 04/16/2019      Reactions   Codeine    REACTION: hives, itching   Losartan Potassium Other (See Comments)   Neomycin-polymyxin-dexameth    Swelling and itching of eye    Oxycodone-acetaminophen    REACTION: unspecified   Paroxetine    Sulfa Antibiotics Hives   Other reaction(s): RASH Other reaction(s): RASH   Sulfonamide Derivatives    REACTION: unspecified   Lisinopril Rash      Medication List       Accurate as of April 16, 2019  9:15 AM. If you have any questions, ask your nurse or doctor.        STOP taking these medications    cholecalciferol 1000 units tablet Commonly known as: VITAMIN D Stopped by: [name], MD     TAKE these medications   carvedilol 25 MG tablet Commonly known as: COREG TAKE ONE TABLET BY MOUTH TWICE A DAY WITH MEALS   diazepam 5 MG tablet Commonly known as: VALIUM TAKE ONE TABLET BY MOUTH DAILY AS NEEDED FOR ANXIETY   hydrochlorothiazide 25 MG  tablet Commonly known as: HYDRODIURIL Take 1 tablet (25 mg total) by mouth daily.   nortriptyline 25 MG capsule Commonly known as: PAMELOR Take 1 capsule by mouth daily.   spironolactone 25 MG tablet Commonly known as: ALDACTONE Take 1 tablet (25 mg total) by mouth daily.

## 2019-04-16 ENCOUNTER — Ambulatory Visit (INDEPENDENT_AMBULATORY_CARE_PROVIDER_SITE_OTHER): Payer: BC Managed Care – PPO | Admitting: Family Medicine

## 2019-04-16 ENCOUNTER — Other Ambulatory Visit: Payer: Self-pay

## 2019-04-16 ENCOUNTER — Encounter: Payer: Self-pay | Admitting: Family Medicine

## 2019-04-16 VITALS — BP 140/100 | HR 90 | Temp 98.2°F | Ht 62.75 in | Wt 205.8 lb

## 2019-04-16 DIAGNOSIS — Z0000 Encounter for general adult medical examination without abnormal findings: Secondary | ICD-10-CM | POA: Diagnosis not present

## 2019-05-08 DIAGNOSIS — R51 Headache: Secondary | ICD-10-CM | POA: Diagnosis not present

## 2019-05-10 ENCOUNTER — Encounter: Payer: Self-pay | Admitting: Primary Care

## 2019-05-10 ENCOUNTER — Ambulatory Visit (INDEPENDENT_AMBULATORY_CARE_PROVIDER_SITE_OTHER): Payer: BC Managed Care – PPO | Admitting: Primary Care

## 2019-05-10 DIAGNOSIS — R05 Cough: Secondary | ICD-10-CM

## 2019-05-10 DIAGNOSIS — R059 Cough, unspecified: Secondary | ICD-10-CM

## 2019-05-10 MED ORDER — AMOXICILLIN-POT CLAVULANATE 875-125 MG PO TABS: 1.0000 | ORAL_TABLET | Freq: Two times a day (BID) | ORAL | 0 refills | Status: DC

## 2019-05-10 MED ORDER — ALBUTEROL SULFATE HFA 108 (90 BASE) MCG/ACT IN AERS: 2.0000 | INHALATION_SPRAY | RESPIRATORY_TRACT | 0 refills | Status: DC | PRN

## 2019-05-10 NOTE — Patient Instructions (Signed)
Start Augmentin antibiotics for the infection Take 1 tablet by mouth twice daily for 7 days.  Shortness of Breath/Wheezing/Cough: Use the albuterol inhaler. Inhale 2 puffs into the lungs every 4 to 6 hours as needed for wheezing, cough, and/or shortness of breath.   Please update [place] in 2-3 days if no improvement in your symptoms. If no improvement then I recommend Covid-19 testing and/or chest xray.  You can take Delsym or Robitussin as needed for cough.  Ensure you are consuming 64 ounces of water daily.  It was a pleasure meeting you! Allie Bossier, NP-C

## 2019-05-10 NOTE — Assessment & Plan Note (Signed)
Symptoms consistent for COPD exacerbation vs bronchitis. No know Covid exposure, keep on differentials list. Given cough, SOB, and increased sputum production coupled with prior smoking history, will treat for COPD exacerbation/bronchitis. Rx for Augmentin course and albuterol inhaler sent to pharmacy. Declines Rx's for Tessalon Perles, cannot take codeine. Discussed use of Robitussin or Delsym OTC.  Discussed that if symptoms do not improve within the next 2-3 days then she will need Covid testing. She will update.

## 2019-05-10 NOTE — Progress Notes (Signed)
Subjective:    Patient ID: Anne Rush, female    DOB: 1961/07/18, 58 y.o.   MRN: [id number]  HPI  Virtual Visit via Video Note  I connected with Anne Rush on 05/10/19 at  8:40 AM EDT by a video enabled telemedicine application and verified that I am speaking with the correct person using two identifiers.  Location: Patient: Home Provider: Office    I discussed the limitations of evaluation and management by telemedicine and the availability of in person appointments. The patient expressed understanding and agreed to proceed.  History of Present Illness:  Anne Rush is a 58 year old female with a history of hypertension, pulmonary fibrosis, bronchitis, complete heart block with pacemaker, tobacco abuse, anxiety disorder who presents today with a chief complaint of cough.  Her symptoms began five days ago with a "drainage" down her throat. She then developed a cough, chest tightness, increased sputum production with purulent color. Her cough is persistent at night, not able to sleep at all. She's been drinking warm honey water at bedtime, also took one dose of Zyrtec, and a few lozenges without improvement.  She has a history of bronchitis, has been treated with albuterol and antibiotics in the past which have helped. She quit smoking in 2012. Denies known exposure to Covid-19. This feels just like her prior bronchitis.   Observations/Objective:  Alert and oriented. Appears tired No distress. Speaking in complete sentences. No cough during exam.  Assessment and Plan:  Symptoms consistent for COPD exacerbation vs bronchitis. No know Covid exposure, keep on differentials list. Given cough, SOB, and increased sputum production coupled with prior smoking history, will treat for COPD exacerbation/bronchitis. Rx for Augmentin course and albuterol inhaler sent to pharmacy. Declines Rx's for Tessalon Perles, cannot take codeine. Discussed use of Robitussin or Delsym  OTC.  Discussed that if symptoms do not improve within the next 2-3 days then she will need Covid testing. She will update.   Follow Up Instructions:  Start Augmentin antibiotics for the infection Take 1 tablet by mouth twice daily for 7 days.  Shortness of Breath/Wheezing/Cough: Use the albuterol inhaler. Inhale 2 puffs into the lungs every 4 to 6 hours as needed for wheezing, cough, and/or shortness of breath.   Please update us in 2-3 days if no improvement in your symptoms. If no improvement then I recommend Covid-19 testing and/or chest xray.  You can take Delsym or Robitussin as needed for cough.  Ensure you are consuming 64 ounces of water daily.  It was a pleasure meeting you! Mayra ReelKate Clark, NP-C    I discussed the assessment and treatment plan with the patient. The patient was provided an opportunity to ask questions and all were answered. The patient agreed with the plan and demonstrated an understanding of the instructions.   The patient was advised to call back or seek an in-person evaluation if the symptoms worsen or if the condition fails to improve as anticipated.   Doreene NestKatherine K Clark, NP    Review of Systems  Constitutional: Positive for fatigue. Negative for fever.  HENT: Positive for congestion, postnasal drip and sore throat.   Respiratory: Positive for cough, chest tightness and shortness of breath.        Past Medical History:  Diagnosis Date  . Anxiety   . Hyperlipidemia   . Hypertension   . Interstitial lung disease (HCC)   . Pollen allergies   . Squamous cell carcinoma 2014   skin cancer  . Subclinical  hyperthyroidism 06/17/2014     Social History   Socioeconomic History  . Marital status: Married    Spouse name: Not on file  . Number of children: 1  . Years of education: Not on file  . Highest education level: Not on file  Occupational History  . Occupation: house wife    Employer: [employer]  Social Needs  . Financial resource strain: Not  on file  . Food insecurity    Worry: Not on file    Inability: Not on file  . Transportation needs    Medical: Not on file    Non-medical: Not on file  Tobacco Use  . Smoking status: Former Games developer  . Smokeless tobacco: Never Used  Substance and Sexual Activity  . Alcohol use: No  . Drug use: No  . Sexual activity: Yes    Birth control/protection: Post-menopausal  Lifestyle  . Physical activity    Days per week: Not on file    Minutes per session: Not on file  . Stress: Not on file  Relationships  . Social Musician on phone: Not on file    Gets together: Not on file    Attends religious service: Not on file    Active member of club or organization: Not on file    Attends meetings of clubs or organizations: Not on file    Relationship status: Not on file  . Intimate partner violence    Fear of current or ex partner: Not on file    Emotionally abused: Not on file    Physically abused: Not on file    Forced sexual activity: Not on file  Other Topics Concern  . Not on file  Social History Narrative   Regular exercise-no    Past Surgical History:  Procedure Laterality Date  . COSMETIC SURGERY    . EP IMPLANTABLE DEVICE N/A 02/26/2016   Procedure: Pacemaker Implant;  Surgeon: Will Jorja Loa, MD;  Location: [facility];  Service: Cardiovascular;  Laterality: N/A;  . TUBAL LIGATION      Family History  Problem Relation Age of Onset  . Anxiety disorder Mother   . Hypertension Mother   . Cancer Maternal Aunt        breast  . Heart failure Maternal Grandfather     Allergies  Allergen Reactions  . Codeine     REACTION: hives, itching  . Losartan Potassium Other (See Comments)  . Neomycin-Polymyxin-Dexameth     Swelling and itching of eye   . Oxycodone-Acetaminophen     REACTION: unspecified  . Paroxetine   . Sulfa Antibiotics Hives    Other reaction(s): RASH Other reaction(s): RASH   . Sulfonamide Derivatives     REACTION: unspecified   . Lisinopril Rash    Current Outpatient Medications on File Prior to Visit  Medication Sig Dispense Refill  . carvedilol (COREG) 25 MG tablet TAKE ONE TABLET BY MOUTH TWICE A DAY WITH MEALS 60 tablet 11  . diazepam (VALIUM) 5 MG tablet TAKE ONE TABLET BY MOUTH DAILY AS NEEDED FOR ANXIETY 30 tablet 4  . hydrochlorothiazide (HYDRODIURIL) 25 MG tablet Take 1 tablet (25 mg total) by mouth daily. 90 tablet 3  . nortriptyline (PAMELOR) 25 MG capsule Take 1 capsule by mouth daily.    [name] spironolactone (ALDACTONE) 25 MG tablet Take 1 tablet (25 mg total) by mouth daily. 90 tablet 1   No current facility-administered medications on file prior to visit.  There were no vitals taken for this visit.   Objective:   Physical Exam  Constitutional: She is oriented to person, place, and time. She appears well-nourished. She appears ill.  Appears tired  Respiratory: Effort normal.  No cough during visit  Neurological: She is alert and oriented to person, place, and time.           Assessment & Plan:

## 2019-05-11 MED ORDER — PREDNISONE 20 MG PO TABS: ORAL_TABLET | ORAL | 0 refills | Status: DC

## 2019-05-17 ENCOUNTER — Emergency Department (HOSPITAL_COMMUNITY)
Admission: EM | Admit: 2019-05-17 | Discharge: 2019-05-17 | Disposition: A | Discharge status: Home / Self Care | Payer: BC Managed Care – PPO | Attending: Emergency Medicine | Admitting: Emergency Medicine

## 2019-05-17 ENCOUNTER — Other Ambulatory Visit: Payer: Self-pay

## 2019-05-17 ENCOUNTER — Encounter (HOSPITAL_COMMUNITY): Payer: Self-pay

## 2019-05-17 ENCOUNTER — Encounter: Payer: Self-pay | Admitting: Family Medicine

## 2019-05-17 ENCOUNTER — Emergency Department (HOSPITAL_COMMUNITY): Payer: BC Managed Care – PPO

## 2019-05-17 DIAGNOSIS — R059 Cough, unspecified: Secondary | ICD-10-CM

## 2019-05-17 DIAGNOSIS — Z950 Presence of cardiac pacemaker: Secondary | ICD-10-CM | POA: Insufficient documentation

## 2019-05-17 DIAGNOSIS — Z85828 Personal history of other malignant neoplasm of skin: Secondary | ICD-10-CM | POA: Insufficient documentation

## 2019-05-17 DIAGNOSIS — Z20828 Contact with and (suspected) exposure to other viral communicable diseases: Secondary | ICD-10-CM | POA: Diagnosis not present

## 2019-05-17 DIAGNOSIS — R05 Cough: Secondary | ICD-10-CM | POA: Diagnosis not present

## 2019-05-17 DIAGNOSIS — I10 Essential (primary) hypertension: Secondary | ICD-10-CM | POA: Diagnosis not present

## 2019-05-17 DIAGNOSIS — Z79899 Other long term (current) drug therapy: Secondary | ICD-10-CM | POA: Diagnosis not present

## 2019-05-17 LAB — SARS CORONAVIRUS 2 (TAT 6-24 HRS): SARS Coronavirus 2: NEGATIVE

## 2019-05-17 MED ORDER — DOXYCYCLINE HYCLATE 100 MG PO CAPS: 100.0000 mg | ORAL_CAPSULE | Freq: Two times a day (BID) | ORAL | 0 refills | Status: DC

## 2019-05-17 MED ORDER — ALBUTEROL SULFATE HFA 108 (90 BASE) MCG/ACT IN AERS: 2.0000 | INHALATION_SPRAY | RESPIRATORY_TRACT | 2 refills | Status: DC | PRN

## 2019-05-17 MED ORDER — PREDNISONE 20 MG PO TABS: ORAL_TABLET | ORAL | 0 refills | Status: DC

## 2019-05-17 NOTE — ED Triage Notes (Signed)
Per pt she started having a cough about 1 week ago. Pt states that she is coughing up scant amount of thick sticky green sputum. Pt states that she believes this is her bronchitis. Pt appropriate in triage.

## 2019-05-17 NOTE — ED Provider Notes (Signed)
[facility] EMERGENCY DEPARTMENT Provider Note   CSN: [id number] Arrival date & time: 05/17/19  0740     History   Chief Complaint No chief complaint on file.   HPI Anne Rush is a 58 y.o. female.     The history is provided by the patient and medical records. No language interpreter was used.   Anne Rush is a 58 y.o. female  with a PMH of interstitial lung disease, hypertension, hyperlipidemia, anxiety who presents to the Emergency Department complaining of persistent cough for about 10 days now.  Started as nasal congestion and has now developed into chest congestion with persistent cough which is worse at night.  Denies any fevers.  Sometimes it is productive, but mostly she feels as if she needs to cough something up but cannot.  She had a virtual visit with her primary care doctor on 8/20 who started her on Augmentin for presumed bronchitis versus COPD exacerbation.  She has taken these medications as directed.  Noted a little improvement over the first 2 to 3 days, but since then notes no improvement.  This morning, she awoke and felt worse than she had previously.  She reports getting a Ventolin inhaler, but every time she uses it, she feels a little bit worse.  She denies any known coronavirus exposures.  She states that her primary care doctor told her that if she was not feeling better, then she should be tested for coronavirus despite no known exposures.  She has shortness of breath only with her coughing fits.  Denies any chest pain.  No abdominal pain, nausea or vomiting.  No sick contacts.  Past Medical History:  Diagnosis Date  . Anxiety   . Hyperlipidemia   . Hypertension   . Interstitial lung disease (HCC)   . Pollen allergies   . Squamous cell carcinoma 2014   skin cancer  . Subclinical hyperthyroidism 06/17/2014    Patient Active Problem List   Diagnosis Date Noted  . Cough 05/10/2019  . Pacemaker 10/01/2018  . Vitamin D deficiency  01/11/2018  . Allergic angioedema 03/17/2016  . Complete heart block (HCC) 02/26/2016  . Obesity (BMI 30-39.9) 04/28/2014  . Postinflammatory pulmonary fibrosis (HCC) 08/27/2010  . Former tobacco use 08/28/2008  . Hyperlipidemia LDL goal <100 06/21/2007  . GAD (generalized anxiety disorder) 06/05/2007  . Essential hypertension 06/05/2007  . ALLERGIC RHINITIS 06/05/2007    Past Surgical History:  Procedure Laterality Date  . COSMETIC SURGERY    . EP IMPLANTABLE DEVICE N/A 02/26/2016   Procedure: Pacemaker Implant;  Surgeon: Will Jorja Loa, MD;  Location: [facility] INVASIVE CV LAB;  Service: Cardiovascular;  Laterality: N/A;  . TUBAL LIGATION       OB History    Gravida  1   Para  1   Term      Preterm  1   AB      Living  1     SAB      TAB      Ectopic      Multiple      Live Births  1            Home Medications    Prior to Admission medications   Medication Sig Start Date End Date Taking? Authorizing Provider  albuterol (VENTOLIN HFA) 108 (90 Base) MCG/ACT inhaler Inhale 2 puffs into the lungs every 4 (four) hours as needed for shortness of breath. 05/10/19   Doreene Nest, NP  amoxicillin-clavulanate (AUGMENTIN) 875-125 MG tablet Take 1 tablet by mouth 2 (two) times daily. 05/10/19   Doreene Nestlark, Katherine K, NP  carvedilol (COREG) 25 MG tablet TAKE ONE TABLET BY MOUTH TWICE A DAY WITH MEALS 09/06/18   Camnitz, Andree CossWill Martin, MD  diazepam (VALIUM) 5 MG tablet TAKE ONE TABLET BY MOUTH DAILY AS NEEDED FOR ANXIETY 07/17/18   Sherlene Shamsullo, Teresa L, MD  hydrochlorothiazide (HYDRODIURIL) 25 MG tablet Take 1 tablet (25 mg total) by mouth daily. 09/11/18   Camnitz, Andree CossWill Martin, MD  nortriptyline (PAMELOR) 25 MG capsule Take 1 capsule by mouth daily. 08/01/18   [provider]  predniSONE (DELTASONE) 20 MG tablet Take 1 tablet daily for 5 days. 05/11/19   Doreene Nestlark, Katherine K, NP  spironolactone (ALDACTONE) 25 MG tablet Take 1 tablet (25 mg total) by mouth daily. 10/05/18  04/03/19  Hannah Beatopland, Spencer, MD    Family History Family History  Problem Relation Age of Onset  . Anxiety disorder Mother   . Hypertension Mother   . Cancer Maternal Aunt        breast  . Heart failure Maternal Grandfather     Social History Social History   Tobacco Use  . Smoking status: Former Games developermoker  . Smokeless tobacco: Never Used  Substance Use Topics  . Alcohol use: No  . Drug use: No     Allergies   Codeine, Losartan potassium, Neomycin-polymyxin-dexameth, Oxycodone-acetaminophen, Paroxetine, Sulfa antibiotics, Sulfonamide derivatives, and Lisinopril   Review of Systems Review of Systems  HENT: Positive for congestion.   Respiratory: Positive for cough, shortness of breath and wheezing.   All other systems reviewed and are negative.    Physical Exam Updated Vital Signs BP (!) 153/100 (BP Location: Right Arm)   Pulse 82   Temp 98.2 F (36.8 C) (Oral)   Resp 14   SpO2 95%   Physical Exam Vitals signs and nursing note reviewed.  Constitutional:      General: She is not in acute distress.    Appearance: She is well-developed.     Comments: Nontoxic-appearing.  HENT:     Head: Normocephalic and atraumatic.  Neck:     Musculoskeletal: Neck supple.  Cardiovascular:     Rate and Rhythm: Normal rate and regular rhythm.     Heart sounds: Normal heart sounds. No murmur.  Pulmonary:     Effort: Pulmonary effort is normal. No respiratory distress.     Breath sounds: Normal breath sounds.     Comments: Lungs are clear to auscultation bilaterally without wheezing. Abdominal:     General: There is no distension.     Palpations: Abdomen is soft.     Tenderness: There is no abdominal tenderness.  Skin:    General: Skin is warm and dry.  Neurological:     Mental Status: She is alert and oriented to person, place, and time.      ED Treatments / Results  Labs (all labs ordered are listed, but only abnormal results are displayed) Labs Reviewed  SARS  CORONAVIRUS 2 (TAT 6-12 HRS)    EKG None  Radiology No results found.  Procedures Procedures (including critical care time)  Medications Ordered in ED Medications - No data to display   Initial Impression / Assessment and Plan / ED Course  I have reviewed the triage vital signs and the nursing notes.  Pertinent labs & imaging results that were available during my care of the patient were reviewed by me and considered in my medical decision making (  see chart for details).       Anne Rush is a 58 y.o. female who presents to ED for persistent cough for a little over a week.  Chart reviewed from previous encounters.  She had a virtual visit with her primary care doctor last week who did start her on prednisone, Augmentin as well as as needed inhaler.  She has finished course and does not feel much better.  On exam, she is well-appearing, afebrile and hemodynamically stable.  Speaking in full sentences without any difficulty, oxygenating fine and without frequent coughing fits during eval.  Lungs are clear to auscultation bilaterally.  Chest x-ray without acute cardiopulmonary processes.  Coronavirus test obtained and pending.  Given she has already had a course of antibiotics as well as clear lung exam today, do not feel that further antibiotics are indicated, but will defer that decision to PCP if they would prefer further treatment.  She will call them today or tomorrow to arrange for a follow-up appointment.  Feel this is likely viral bronchitis with persistent coughing which is worse at night.  Offered several different types of cough suppressants and symptomatic management all of which she has allergies to or declined as they do not help much.  Reasons to return to the emergency department were discussed and all questions were answered.    Final Clinical Impressions(s) / ED Diagnoses   Final diagnoses:  Cough    ED Discharge Orders    None       Sofhia Ulibarri, Ozella Almond,  PA-C 05/17/19 [place], MD 05/18/19 1105

## 2019-05-17 NOTE — Telephone Encounter (Signed)
With her lungs, I am totally fine extending antibiotics and prednisone  Doxycycline 100 mg, 1 tablet po BID, #20, 0 ref  Prednisone 20 mg, 2 tabs po for 5 days, then 1 tab po for 5 days, #15, 0   Her albuterol inhaler should be what she needs (same medicine as a neb and just as effective)

## 2019-05-17 NOTE — Discharge Instructions (Signed)
It was my pleasure taking care of you today!   Your coronavirus test is still pending.  Results should be available by tomorrow morning, likely sooner.  You will be notified if you test positive.  Call your primary care doctor to schedule a follow-up appointment.  Tele health visit should be fine.   Return to ER for new or worsening symptoms, any additional concerns.

## 2019-05-17 NOTE — ED Notes (Signed)
Pt ambulating to restroom without difficulty.   

## 2019-06-01 ENCOUNTER — Other Ambulatory Visit: Payer: Self-pay | Admitting: Family Medicine

## 2019-06-14 ENCOUNTER — Ambulatory Visit (INDEPENDENT_AMBULATORY_CARE_PROVIDER_SITE_OTHER): Payer: BC Managed Care – PPO | Admitting: *Deleted

## 2019-06-14 DIAGNOSIS — I442 Atrioventricular block, complete: Secondary | ICD-10-CM | POA: Diagnosis not present

## 2019-06-15 LAB — CUP PACEART REMOTE DEVICE CHECK
Battery Remaining Longevity: 113 mo
Battery Remaining Percentage: 95.5 %
Battery Voltage: 2.98 V
Brady Statistic AP VP Percent: 1 %
Brady Statistic AP VS Percent: 1 %
Brady Statistic AS VP Percent: 99 %
Brady Statistic AS VS Percent: 1 %
Brady Statistic RA Percent Paced: 1 %
Brady Statistic RV Percent Paced: 99 %
Date Time Interrogation Session: 20200923060014
Implantable Lead Implant Date: 20170608
Implantable Lead Implant Date: 20170608
Implantable Lead Location: 753859
Implantable Lead Location: 753860
Implantable Pulse Generator Implant Date: 20170608
Lead Channel Impedance Value: 430 Ohm
Lead Channel Impedance Value: 560 Ohm
Lead Channel Pacing Threshold Amplitude: 0.75 V
Lead Channel Pacing Threshold Amplitude: 1 V
Lead Channel Pacing Threshold Pulse Width: 0.4 ms
Lead Channel Pacing Threshold Pulse Width: 0.4 ms
Lead Channel Sensing Intrinsic Amplitude: 1.8 mV
Lead Channel Sensing Intrinsic Amplitude: 12 mV
Lead Channel Setting Pacing Amplitude: 2 V
Lead Channel Setting Pacing Amplitude: 2 V
Lead Channel Setting Pacing Pulse Width: 0.4 ms
Lead Channel Setting Sensing Sensitivity: 2.5 mV
Pulse Gen Model: 2272
Pulse Gen Serial Number: 7903881

## 2019-06-21 ENCOUNTER — Encounter: Payer: Self-pay | Admitting: Cardiology

## 2019-06-21 NOTE — Progress Notes (Signed)
Remote pacemaker transmission.   

## 2019-07-03 NOTE — Progress Notes (Signed)
Spencer T. Copland, MD Primary Care and [place] at [facility]: [place] Rush, [address] Phone: [phone]  FAX: [phone]  Anne Rush - 58 y.o. female  MRN [id number]  Date of Birth: Jun 01, 1961  Visit Date: 07/04/2019  PCP: Owens Loffler, MD  Referred by: Owens Loffler, MD  Chief Complaint  Patient presents with  . Cough    Since  August 13  . Elevated Heart Rate   Subjective:   Anne Rush is a 58 y.o. very pleasant female patient who presents with the following:  She is a nice lady with pulmonary fibrosis who presents with a 2 month history of cough. 05/17/2019 I placed her on 10 days of oral prednisone and she has some ventolin at home, too.  She also has a pacemaker. ? Increased heart rate.   She did take some doxy and prednisone. Cough syrup does not help. This has been ongoing for 2 months.   She does have post-inflammatory pulmonary fibrosis, but she ultimately did not f/u with [facility] due to distance and cost.   Past Medical History, Surgical History, Social History, Family History, Problem List, Medications, and Allergies have been reviewed and updated if relevant.  Patient Active Problem List   Diagnosis Date Noted  . Cough 05/10/2019  . Pacemaker 10/01/2018  . Vitamin D deficiency 01/11/2018  . Allergic angioedema 03/17/2016  . Complete heart block ([place]) 02/26/2016  . Obesity (BMI 30-39.9) 04/28/2014  . Postinflammatory pulmonary fibrosis ([place]) 08/27/2010  . Former tobacco use 08/28/2008  . Hyperlipidemia LDL goal <100 06/21/2007  . GAD (generalized anxiety disorder) 06/05/2007  . Essential hypertension 06/05/2007  . ALLERGIC RHINITIS 06/05/2007    Past Medical History:  Diagnosis Date  . Anxiety   . Hyperlipidemia   . Hypertension   . Interstitial lung disease ([place])   . Pollen allergies   . Squamous cell carcinoma 2014   skin cancer  . Subclinical hyperthyroidism  06/17/2014    Past Surgical History:  Procedure Laterality Date  . COSMETIC SURGERY    . EP IMPLANTABLE DEVICE N/A 02/26/2016   Procedure: Pacemaker Implant;  Surgeon: Will Meredith Leeds, MD;  Location: [facility] CV LAB;  Service: Cardiovascular;  Laterality: N/A;  . TUBAL LIGATION      Social History   Socioeconomic History  . Marital status: Married    Spouse name: Not on file  . Number of children: 1  . Years of education: Not on file  . Highest education level: Not on file  Occupational History  . Occupation: house wife    Employer: [employer]  Social Needs  . Financial resource strain: Not on file  . Food insecurity    Worry: Not on file    Inability: Not on file  . Transportation needs    Medical: Not on file    Non-medical: Not on file  Tobacco Use  . Smoking status: Former Research scientist (life sciences)  . Smokeless tobacco: Never Used  Substance and Sexual Activity  . Alcohol use: No  . Drug use: No  . Sexual activity: Yes    Birth control/protection: Post-menopausal  Lifestyle  . Physical activity    Days per week: Not on file    Minutes per session: Not on file  . Stress: Not on file  Relationships  . Social Herbalist on phone: Not on file    Gets together: Not on file    Attends religious  service: Not on file    Active member of club or organization: Not on file    Attends meetings of clubs or organizations: Not on file    Relationship status: Not on file  . Intimate partner violence    Fear of current or ex partner: Not on file    Emotionally abused: Not on file    Physically abused: Not on file    Forced sexual activity: Not on file  Other Topics Concern  . Not on file  Social History Narrative   Regular exercise-no    Family History  Problem Relation Age of Onset  . Anxiety disorder Mother   . Hypertension Mother   . Cancer Maternal Aunt        breast  . Heart failure Maternal Grandfather     Allergies  Allergen Reactions  . Codeine      REACTION: hives, itching  . Losartan Potassium Other (See Comments)  . Neomycin-Polymyxin-Dexameth     Swelling and itching of eye   . Oxycodone-Acetaminophen     REACTION: unspecified  . Paroxetine   . Sulfa Antibiotics Hives    Other reaction(s): RASH Other reaction(s): RASH   . Sulfonamide Derivatives     REACTION: unspecified  . Lisinopril Rash    Medication list reviewed and updated in full in [place].  ROS: GEN: Acute illness details above GI: Tolerating PO intake GU: maintaining adequate hydration and urination Pulm: No SOB Interactive and getting along well at home.  Otherwise, ROS is as per the HPI.  Objective:   BP (!) 130/92   Pulse 93   Temp (!) 97.2 F (36.2 C) (Temporal)   Ht 5' 2.75" (1.594 m)   Wt 207 lb (93.9 kg)   SpO2 98%   BMI 36.96 kg/m    GEN: WDWN, NAD, Non-toxic, A & O x 3 HEENT: Atraumatic, Normocephalic. Neck supple. No masses, No LAD. Ears and Nose: No external deformity. CV: RRR, No M/G/R. No JVD. No thrill. No extra heart sounds. PULM: CTA B, no wheezes, crackles, rhonchi. No retractions. No resp. distress. No accessory muscle use. EXTR: No c/c/e NEURO Normal gait.  PSYCH: Normally interactive. Conversant. Not depressed or anxious appearing.  Calm demeanor.    Laboratory and Imaging Data: Lab Results  Component Value Date   CHOL 226 (H) 04/13/2019   Lab Results  Component Value Date   HDL 48.50 04/13/2019   Lab Results  Component Value Date   LDLCALC 149 (H) 05/12/2015   Lab Results  Component Value Date   TRIG 273.0 (H) 04/13/2019   Lab Results  Component Value Date   CHOLHDL 5 04/13/2019    Hepatic Function Latest Ref Rng & Units 04/13/2019 06/26/2018 01/09/2018  Total Protein 6.0 - 8.3 g/dL 7.4 7.4 7.7  Albumin 3.5 - 5.2 g/dL 4.6 4.7 4.6  AST 0 - 37 U/L _0 ALT 0 - 35 U/L _1 Alk Phosphatase 39 - 117 U/L 42 50 45  Total Bilirubin 0.2 - 1.2 mg/dL 0.6 0.7 0.5  Bilirubin, Direct 0.0 - 0.3 mg/dL  0.1 - -   Lab Results  Component Value Date   HGBA1C 5.9 04/13/2019    Lab Results  Component Value Date   WBC 5.7 04/13/2019   HGB 14.2 04/13/2019   HCT 41.0 04/13/2019   MCV 92.2 04/13/2019   PLT 236.0 04/13/2019     Assessment and Plan:     ICD-10-CM   1. Postinflammatory  pulmonary fibrosis ([place])  J84.10 DG Chest 2 View  2. Persistent cough for 3 weeks or longer  R05 DG Chest 2 View   I think that she needs to resume care with pulmonology give PF, as disease course will progress.  At this point I am in a give her 14 days of steroids.  I do not see a consolidation or pneumonia on chest x-ray, but I am can await for formal interpretation.  We talked about resuming care with pulmonology given her pulmonary fibrosis, and that at this point she is resistant to do this.  She notes that if the chest x-ray is different that she would consider going.  I did talk about potential morbidity and mortality given this disease, and I would recommend that she have continuity and continued care with pulmonology.  Follow-up: No follow-ups on file.  No orders of the defined types were placed in this encounter.  Orders Placed This Encounter  Procedures  . DG Chest 2 View    Signed,  Frederico Hamman T. Copland, MD   Outpatient Encounter Medications as of 07/04/2019  Medication Sig  . albuterol (VENTOLIN HFA) 108 (90 Base) MCG/ACT inhaler Inhale 2 puffs into the lungs every 4 (four) hours as needed for shortness of breath.  . Ascorbic Acid (VITAMIN C) 1000 MG tablet Take 1,000 mg by mouth daily.  . Biotin 2.5 MG TABS Take 2.5 mg by mouth daily.  . carvedilol (COREG) 25 MG tablet TAKE ONE TABLET BY MOUTH TWICE A DAY WITH MEALS  . cholecalciferol (VITAMIN D3) 25 MCG (1000 UT) tablet Take 1,000 Units by mouth daily.  . diazepam (VALIUM) 5 MG tablet TAKE ONE TABLET BY MOUTH DAILY AS NEEDED FOR ANXIETY  . hydrochlorothiazide (HYDRODIURIL) 25 MG tablet Take 1 tablet (25 mg total) by mouth daily.  .  nortriptyline (PAMELOR) 25 MG capsule Take 1 capsule by mouth daily.  [name] omega-3 acid ethyl esters (LOVAZA) 1 g capsule Take 1 g by mouth 2 (two) times daily.  [name] spironolactone (ALDACTONE) 25 MG tablet TAKE ONE TABLET BY MOUTH DAILY  . [DISCONTINUED] doxycycline (VIBRAMYCIN) 100 MG capsule Take 1 capsule (100 mg total) by mouth 2 (two) times daily.  . [DISCONTINUED] predniSONE (DELTASONE) 20 MG tablet Take 2 tablets by mouth x 5 days, then 1 tablet by mouth x 5 days.   No facility-administered encounter medications on file as of 07/04/2019.

## 2019-07-04 ENCOUNTER — Ambulatory Visit: Payer: BC Managed Care – PPO | Admitting: Family Medicine

## 2019-07-04 ENCOUNTER — Encounter: Payer: Self-pay | Admitting: Family Medicine

## 2019-07-04 ENCOUNTER — Other Ambulatory Visit: Payer: Self-pay

## 2019-07-04 ENCOUNTER — Ambulatory Visit (INDEPENDENT_AMBULATORY_CARE_PROVIDER_SITE_OTHER)
Admission: RE | Admit: 2019-07-04 | Discharge: 2019-07-04 | Disposition: A | Discharge status: Home / Self Care | Payer: BC Managed Care – PPO | Source: Ambulatory Visit | Attending: Family Medicine | Admitting: Family Medicine

## 2019-07-04 VITALS — BP 130/92 | HR 93 | Temp 97.2°F | Ht 62.75 in | Wt 207.0 lb

## 2019-07-04 DIAGNOSIS — R053 Chronic cough: Secondary | ICD-10-CM

## 2019-07-04 DIAGNOSIS — R05 Cough: Secondary | ICD-10-CM

## 2019-07-04 DIAGNOSIS — J8410 Pulmonary fibrosis, unspecified: Secondary | ICD-10-CM | POA: Diagnosis not present

## 2019-07-04 MED ORDER — PREDNISONE 20 MG PO TABS: ORAL_TABLET | ORAL | 0 refills | Status: DC

## 2019-07-18 ENCOUNTER — Encounter: Payer: Self-pay | Admitting: Family Medicine

## 2019-07-31 DIAGNOSIS — Z23 Encounter for immunization: Secondary | ICD-10-CM | POA: Diagnosis not present

## 2019-08-27 ENCOUNTER — Other Ambulatory Visit: Payer: Self-pay | Admitting: Cardiology

## 2019-08-30 ENCOUNTER — Other Ambulatory Visit: Payer: Self-pay | Admitting: Cardiology

## 2019-09-13 ENCOUNTER — Ambulatory Visit (INDEPENDENT_AMBULATORY_CARE_PROVIDER_SITE_OTHER): Payer: BC Managed Care – PPO | Admitting: *Deleted

## 2019-09-13 DIAGNOSIS — I442 Atrioventricular block, complete: Secondary | ICD-10-CM | POA: Diagnosis not present

## 2019-09-13 LAB — CUP PACEART REMOTE DEVICE CHECK
Battery Remaining Longevity: 113 mo
Battery Remaining Percentage: 95.5 %
Battery Voltage: 2.98 V
Brady Statistic AP VP Percent: 1 %
Brady Statistic AP VS Percent: 1 %
Brady Statistic AS VP Percent: 99 %
Brady Statistic AS VS Percent: 1 %
Brady Statistic RA Percent Paced: 1 %
Brady Statistic RV Percent Paced: 99 %
Date Time Interrogation Session: 20201223020024
Implantable Lead Implant Date: 20170608
Implantable Lead Implant Date: 20170608
Implantable Lead Location: 753859
Implantable Lead Location: 753860
Implantable Pulse Generator Implant Date: 20170608
Lead Channel Impedance Value: 440 Ohm
Lead Channel Impedance Value: 560 Ohm
Lead Channel Pacing Threshold Amplitude: 0.75 V
Lead Channel Pacing Threshold Amplitude: 1 V
Lead Channel Pacing Threshold Pulse Width: 0.4 ms
Lead Channel Pacing Threshold Pulse Width: 0.4 ms
Lead Channel Sensing Intrinsic Amplitude: 12 mV
Lead Channel Sensing Intrinsic Amplitude: 2.3 mV
Lead Channel Setting Pacing Amplitude: 2 V
Lead Channel Setting Pacing Amplitude: 2 V
Lead Channel Setting Pacing Pulse Width: 0.4 ms
Lead Channel Setting Sensing Sensitivity: 2.5 mV
Pulse Gen Model: 2272
Pulse Gen Serial Number: 7903881

## 2019-09-16 NOTE — Progress Notes (Signed)
PPM remote 

## 2019-10-01 ENCOUNTER — Other Ambulatory Visit: Payer: Self-pay | Admitting: Cardiology

## 2019-10-04 DIAGNOSIS — F411 Generalized anxiety disorder: Secondary | ICD-10-CM | POA: Diagnosis not present

## 2019-10-04 DIAGNOSIS — R200 Anesthesia of skin: Secondary | ICD-10-CM | POA: Diagnosis not present

## 2019-10-04 DIAGNOSIS — R42 Dizziness and giddiness: Secondary | ICD-10-CM | POA: Diagnosis not present

## 2019-10-04 DIAGNOSIS — G44229 Chronic tension-type headache, not intractable: Secondary | ICD-10-CM | POA: Diagnosis not present

## 2019-11-05 ENCOUNTER — Other Ambulatory Visit: Payer: Self-pay

## 2019-11-05 ENCOUNTER — Telehealth: Payer: Self-pay | Admitting: *Deleted

## 2019-11-05 ENCOUNTER — Ambulatory Visit
Admission: RE | Admit: 2019-11-05 | Discharge: 2019-11-05 | Disposition: A | Discharge status: Home / Self Care | Payer: BC Managed Care – PPO | Source: Ambulatory Visit | Attending: Family Medicine | Admitting: Family Medicine

## 2019-11-05 ENCOUNTER — Ambulatory Visit: Payer: BC Managed Care – PPO | Admitting: Family Medicine

## 2019-11-05 ENCOUNTER — Encounter: Payer: Self-pay | Admitting: Family Medicine

## 2019-11-05 VITALS — BP 128/102 | HR 90 | Temp 97.5°F | Ht 62.75 in | Wt 205.5 lb

## 2019-11-05 DIAGNOSIS — F411 Generalized anxiety disorder: Secondary | ICD-10-CM

## 2019-11-05 DIAGNOSIS — M79662 Pain in left lower leg: Secondary | ICD-10-CM

## 2019-11-05 NOTE — Progress Notes (Signed)
Anne Rush T. Anne Hunton, MD Primary Care and Sports Medicine [facility] at [facility] [address] [place], [address] Phone: [phone]  FAX: [phone]  Anne Rush - 59 y.o. female  MRN [id number]  Date of Birth: 1961-04-02  Visit Date: 11/05/2019  PCP: [name], MD  Referred by: [name], MD  Chief Complaint  Patient presents with  . Leg Pain    Left    This visit occurred during the SARS-CoV-2 public health emergency.  Safety protocols were in place, including screening questions prior to the visit, additional usage of staff PPE, and extensive cleaning of exam room while observing appropriate contact time as indicated for disinfecting solutions.   Subjective:   Anne Rush is a 59 y.o. very pleasant female patient who presents with the following:  L leg pain:  Saturday, had been bothering her some.  L knee and went to bed and little pillow and when she was laying there and pain up to her hip.  She is primarily concerned about her left lower extremity with has some pain in the calf.  She has minimal pain in the knee.  Her whole leg felt heavy and tight.  She does have some ongoing left knee pain, but is not had any specific injury.  Yesterday, got up and knee was still huting.  At three oclock.  Whole leg felt heavy and tight.    L knee bothers her sometime.    Knee has botehering her a little bit more than normal.  This morning calf area feel like in a cramp.   Review of Systems is noted in the HPI, as appropriate  Objective:   BP (!) 128/102   Pulse 90   Temp (!) 97.5 F (36.4 C) (Temporal)   Ht 5' 2.75" (1.594 m)   Wt 205 lb 8 oz (93.2 kg)   SpO2 97%   BMI 36.69 kg/m   GEN: WDWN, NAD, Non-toxic HEENT: Atraumatic, Normocephalic. Neck supple. No masses. EXTR: No c/c/e NEURO Normal gait.  PSYCH: Normally interactive. Conversant.   The knee does have some pain at the medial lateral joint line.  There is no  significant crepitus.  She does have some pain with flexion pinch and McMurray's without any mechanical symptoms.  Homans test is positive.  Pain with squeezing of the calf.  Laboratory and Imaging Data: US Venous Img Lower Unilateral Left (DVT)  Result Date: 11/05/2019 CLINICAL DATA:  Left lower extremity pain for the past 2 days. Evaluate for DVT. EXAM: LEFT LOWER EXTREMITY VENOUS DOPPLER ULTRASOUND TECHNIQUE: Gray-scale sonography with graded compression, as well as color Doppler and duplex ultrasound were performed to evaluate the lower extremity deep venous systems from the level of the common femoral vein and including the common femoral, femoral, profunda femoral, popliteal and calf veins including the posterior tibial, peroneal and gastrocnemius veins when visible. The superficial great saphenous vein was also interrogated. Spectral Doppler was utilized to evaluate flow at rest and with distal augmentation maneuvers in the common femoral, femoral and popliteal veins. COMPARISON:  None. FINDINGS: Contralateral Common Femoral Vein: Respiratory phasicity is normal and symmetric with the symptomatic side. No evidence of thrombus. Normal compressibility. Common Femoral Vein: No evidence of thrombus. Normal compressibility, respiratory phasicity and response to augmentation. Saphenofemoral Junction: No evidence of thrombus. Normal compressibility and flow on color Doppler imaging. Profunda Femoral Vein: No evidence of thrombus. Normal compressibility and flow on color Doppler imaging. Femoral Vein: No evidence of  thrombus. Normal compressibility, respiratory phasicity and response to augmentation. Popliteal Vein: No evidence of thrombus. Normal compressibility, respiratory phasicity and response to augmentation. Calf Veins: No evidence of thrombus. Normal compressibility and flow on color Doppler imaging. Superficial Great Saphenous Vein: No evidence of thrombus. Normal compressibility. Venous Reflux:   None. Other Findings:  None. IMPRESSION: No evidence of DVT within the left lower extremity. Electronically Signed   By: Sandi Mariscal M.D.   On: 11/05/2019 15:17     Assessment and Plan:     ICD-10-CM   1. Pain of left calf  M79.662 US Venous Img Lower Unilateral Left (DVT)  2. GAD (generalized anxiety disorder)  F41.1    With concern of posterior calf pain and pain with squeeze, I did obtain an ultrasound of the left lower extremity.  Thankfully this has returned and is negative for DVT.  She also has some generalized anxiety and some occasional panic attacks, so I did refill her Valium as well  Follow-up: No follow-ups on file.  Meds ordered this encounter  Medications  . diazepam (VALIUM) 5 MG tablet    Sig: Take 1 tablet (5 mg total) by mouth every 8 (eight) hours as needed for anxiety.    Dispense:  30 tablet    Refill:  3   Medications Discontinued During This Encounter  Medication Reason  . predniSONE (DELTASONE) 20 MG tablet Completed Course  . diazepam (VALIUM) 5 MG tablet Reorder   Orders Placed This Encounter  Procedures  . US Venous Img Lower Unilateral Left (DVT)    Signed,  Renton Berkley T. Shenay Torti, MD   Outpatient Encounter Medications as of 11/05/2019  Medication Sig  . albuterol (VENTOLIN HFA) 108 (90 Base) MCG/ACT inhaler Inhale 2 puffs into the lungs every 4 (four) hours as needed for shortness of breath.  . Ascorbic Acid (VITAMIN C) 1000 MG tablet Take 1,000 mg by mouth daily.  . Biotin 2.5 MG TABS Take 2.5 mg by mouth daily.  . carvedilol (COREG) 25 MG tablet TAKE ONE TABLET BY MOUTH TWICE A DAY WITH MEALS  . cholecalciferol (VITAMIN D3) 25 MCG (1000 UT) tablet Take 1,000 Units by mouth daily.  . diazepam (VALIUM) 5 MG tablet Take 1 tablet (5 mg total) by mouth every 8 (eight) hours as needed for anxiety.  . hydrochlorothiazide (HYDRODIURIL) 25 MG tablet TAKE ONE TABLET BY MOUTH DAILY  . nortriptyline (PAMELOR) 25 MG capsule Take 1 capsule by mouth daily.   [name] omega-3 acid ethyl esters (LOVAZA) 1 g capsule Take 1 g by mouth 2 (two) times daily.  [name] spironolactone (ALDACTONE) 25 MG tablet TAKE ONE TABLET BY MOUTH DAILY  . [DISCONTINUED] diazepam (VALIUM) 5 MG tablet TAKE ONE TABLET BY MOUTH DAILY AS NEEDED FOR ANXIETY  . [DISCONTINUED] predniSONE (DELTASONE) 20 MG tablet 2 tabs po for 7 days, then 1 tab po for 7 days   No facility-administered encounter medications on file as of 11/05/2019.

## 2019-11-05 NOTE — Telephone Encounter (Signed)
Shaneeka notified that her doppler u/s showed no blood clots.  While on the phone Anne Rush states she forgot to talk to Dr. Patsy Lager about this today but when she gets stressed out her HR really increases.  She states she use to take valium but never got a refill on it.  She is requesting something to have on hand to calm her down when she has stressfull situations.  Please advise.

## 2019-11-06 MED ORDER — DIAZEPAM 5 MG PO TABS: 5.0000 mg | ORAL_TABLET | Freq: Three times a day (TID) | ORAL | 3 refills | Status: DC | PRN

## 2019-11-06 NOTE — Telephone Encounter (Signed)
This was done this morning  

## 2019-11-19 ENCOUNTER — Encounter: Payer: Self-pay | Admitting: Cardiology

## 2019-11-19 ENCOUNTER — Ambulatory Visit: Payer: BC Managed Care – PPO | Admitting: Cardiology

## 2019-11-19 ENCOUNTER — Other Ambulatory Visit: Payer: Self-pay

## 2019-11-19 VITALS — BP 134/96 | HR 89 | Ht 62.75 in | Wt 207.8 lb

## 2019-11-19 DIAGNOSIS — I442 Atrioventricular block, complete: Secondary | ICD-10-CM

## 2019-11-19 LAB — CUP PACEART INCLINIC DEVICE CHECK
Battery Remaining Longevity: 94 mo
Battery Voltage: 2.96 V
Brady Statistic RA Percent Paced: 0.05 %
Brady Statistic RV Percent Paced: 99.99 %
Date Time Interrogation Session: 20210301134900
Implantable Lead Implant Date: 20170608
Implantable Lead Implant Date: 20170608
Implantable Lead Location: 753859
Implantable Lead Location: 753860
Implantable Pulse Generator Implant Date: 20170608
Lead Channel Impedance Value: 450 Ohm
Lead Channel Impedance Value: 487.5 Ohm
Lead Channel Pacing Threshold Amplitude: 0.75 V
Lead Channel Pacing Threshold Amplitude: 1 V
Lead Channel Pacing Threshold Pulse Width: 0.4 ms
Lead Channel Pacing Threshold Pulse Width: 0.4 ms
Lead Channel Sensing Intrinsic Amplitude: 4.2 mV
Lead Channel Setting Pacing Amplitude: 2 V
Lead Channel Setting Pacing Amplitude: 2.5 V
Lead Channel Setting Pacing Pulse Width: 0.4 ms
Lead Channel Setting Sensing Sensitivity: 2.5 mV
Pulse Gen Model: 2272
Pulse Gen Serial Number: 7903881

## 2019-11-19 NOTE — Patient Instructions (Signed)
Medication Instructions:  Your physician recommends that you continue on your current medications as directed. Please refer to the Current Medication list given to you today.  *If you need a refill on your cardiac medications before your next appointment, please call your pharmacy*   Lab Work: None ordered   Testing/Procedures: None ordered   Follow-Up: At [facility], you and your health needs are our priority.  As part of our continuing mission to provide you with exceptional heart care, we have created designated Provider Care Teams.  These Care Teams include your primary Cardiologist (physician) and Advanced Practice Providers (APPs -  Physician Assistants and Nurse Practitioners) who all work together to provide you with the care you need, when you need it.  We recommend signing up for the patient portal called "MyChart".  Sign up information is provided on this After Visit Summary.  MyChart is used to connect with patients for Virtual Visits (Telemedicine).  Patients are able to view lab/test results, encounter notes, upcoming appointments, etc.  Non-urgent messages can be sent to your provider as well.   To learn more about what you can do with MyChart, go to [email or web address].    Your next appointment:   1 year(s)  The format for your next appointment:   In Person  Provider:   Will Camnitz, MD    Thank you for choosing [facility]!!   Dacoda Spallone, RN [phone]   Other Instructions     

## 2019-11-19 NOTE — Progress Notes (Signed)
Electrophysiology Office Note   Date:  11/19/2019   ID:  Anne Rush, DOB 28-Jun-1961, MRN [id number]  PCP:  Owens Loffler, MD  Primary Electrophysiologist:  Ulrich Soules Meredith Leeds, MD    No chief complaint on file.    History of Present Illness: Anne Rush is a 59 y.o. female who presents today for electrophysiology evaluation.   She presented to the hospital in complete heart block and had a St. Jude dual-chamber pacemaker placed on 02/26/16.   Today, denies symptoms of palpitations, chest pain, shortness of breath, orthopnea, PND, lower extremity edema, claudication, dizziness, presyncope, syncope, bleeding, or neurologic sequela. The patient is tolerating medications without difficulties.  Currently feels well.  She has no chest pain or shortness of breath.  She is able to do all her daily activities restriction.  She says that her blood pressures generally are in the 818 systolic at home.  Her diastolic blood pressures have gone up a little bit but otherwise she is doing well.   Past Medical History:  Diagnosis Date  . Anxiety   . Hyperlipidemia   . Hypertension   . Interstitial lung disease ([place])   . Pollen allergies   . Squamous cell carcinoma 2014   skin cancer  . Subclinical hyperthyroidism 06/17/2014   Past Surgical History:  Procedure Laterality Date  . COSMETIC SURGERY    . EP IMPLANTABLE DEVICE N/A 02/26/2016   Procedure: Pacemaker Implant;  Surgeon: Esbeydi Manago Meredith Leeds, MD;  Location: [facility];  Service: Cardiovascular;  Laterality: N/A;  . TUBAL LIGATION       Current Outpatient Medications  Medication Sig Dispense Refill  . Ascorbic Acid (VITAMIN C) 1000 MG tablet Take 1,000 mg by mouth daily.    . carvedilol (COREG) 25 MG tablet TAKE ONE TABLET BY MOUTH TWICE A DAY WITH MEALS 60 tablet 2  . cholecalciferol (VITAMIN D3) 25 MCG (1000 UT) tablet Take 1,000 Units by mouth daily.    . diazepam (VALIUM) 5 MG tablet Take 1 tablet (5 mg total) by mouth  every 8 (eight) hours as needed for anxiety. 30 tablet 3  . hydrochlorothiazide (HYDRODIURIL) 25 MG tablet TAKE ONE TABLET BY MOUTH DAILY 30 tablet 2  . nortriptyline (PAMELOR) 25 MG capsule Take 1 capsule by mouth daily.     No current facility-administered medications for this visit.    Allergies:   Codeine, Losartan potassium, Neomycin-polymyxin-dexameth, Oxycodone-acetaminophen, Paroxetine, Sulfa antibiotics, Sulfonamide derivatives, and Lisinopril   Social History:  The patient  reports that she has quit smoking. She has never used smokeless tobacco. She reports that she does not drink alcohol or use drugs.   Family History:  The patient's family history includes Anxiety disorder in her mother; Cancer in her maternal aunt; Heart failure in her maternal grandfather; Hypertension in her mother.   ROS:  Please see the history of present illness.   Otherwise, review of systems is positive for none.   All other systems are reviewed and negative.   PHYSICAL EXAM: VS:  BP (!) 134/96   Pulse 89   Ht 5' 2.75" (1.594 m)   Wt 207 lb 12.8 oz (94.3 kg)   SpO2 96%   BMI 37.10 kg/m  , BMI Body mass index is 37.1 kg/m. GEN: Well nourished, well developed, in no acute distress  HEENT: normal  Neck: no JVD, carotid bruits, or masses Cardiac: RRR; no murmurs, rubs, or gallops,no edema  Respiratory:  clear to auscultation bilaterally, normal work of breathing  GI: soft, nontender, nondistended, + BS MS: no deformity or atrophy  Skin: warm and dry, device site well healed Neuro:  Strength and sensation are intact Psych: euthymic mood, full affect  EKG:  EKG is ordered today. Personal review of the ekg ordered shows sinus rhythm, ventricular paced  Personal review of the device interrogation today. Results in Paceart   Recent Labs: 04/13/2019: ALT 20; BUN 12; Creatinine, Ser 0.63; Hemoglobin 14.2; Platelets 236.0; Potassium 3.9; Sodium 137    Lipid Panel     Component Value Date/Time    CHOL 226 (H) 04/13/2019 0812   TRIG 273.0 (H) 04/13/2019 0812   HDL 48.50 04/13/2019 0812   CHOLHDL 5 04/13/2019 0812   VLDL 54.6 (H) 04/13/2019 0812   LDLCALC 149 (H) 05/12/2015 0901   LDLDIRECT 160.0 04/13/2019 0812     Wt Readings from Last 3 Encounters:  11/19/19 207 lb 12.8 oz (94.3 kg)  11/05/19 205 lb 8 oz (93.2 kg)  07/04/19 207 lb (93.9 kg)      Other studies Reviewed: Additional studies/ records that were reviewed today include: TTE 03/29/17 Review of the above records today demonstrates:  - Left ventricle: The cavity size was normal. Wall thickness was   increased in a pattern of mild LVH. Systolic function was normal.   The estimated ejection fraction was in the range of 50% to 55%.   Apical septal hypokinesis. Doppler parameters are consistent with   abnormal left ventricular relaxation (grade 1 diastolic   dysfunction). The E/e&' ratio is between 8-15, suggesting   indeterminate LV filling pressure. - Left atrium: The atrium was normal in size. - Tricuspid valve: There was mild regurgitation. - Pulmonary arteries: PA peak pressure: 27 mm Hg (S). - Inferior vena cava: The vessel was normal in size. The   respirophasic diameter changes were in the normal range (>= 50%),   consistent with normal central venous pressure.   ASSESSMENT AND PLAN:  1.  Complete AV block: Status post Saint Jude dual-chamber pacemaker implanted 02/27/2016.  Device functioning appropriately.  No changes.    2. Hypertension: Blood pressure mildly elevated today.  We Consepcion Utt continue with current management on hydrochlorothiazide and carvedilol.  3. Mild systolic heart failure, chronic: Currently on carvedilol.  Feeling well.  No changes.  Current medicines are reviewed at length with the patient today.   The patient does not have concerns regarding her medicines.  The following changes were made today: none  Labs/ tests ordered today include:  Orders Placed This Encounter  Procedures  .  EKG 12-Lead     Disposition:   FU with Elzina Devera 12 months  Signed, Krystiana Fornes Jorja Loa, MD  11/19/2019 2:12 PM     [facility] [address] [place] [address] [phone] (office) [phone] (fax)

## 2019-11-26 ENCOUNTER — Other Ambulatory Visit: Payer: Self-pay | Admitting: Cardiology

## 2019-12-13 ENCOUNTER — Ambulatory Visit (INDEPENDENT_AMBULATORY_CARE_PROVIDER_SITE_OTHER): Payer: BC Managed Care – PPO | Admitting: *Deleted

## 2019-12-13 DIAGNOSIS — I442 Atrioventricular block, complete: Secondary | ICD-10-CM

## 2019-12-15 LAB — CUP PACEART REMOTE DEVICE CHECK
Battery Remaining Longevity: 101 mo
Battery Remaining Percentage: 95.5 %
Battery Voltage: 2.96 V
Brady Statistic AP VP Percent: 1 %
Brady Statistic AP VS Percent: 0 %
Brady Statistic AS VP Percent: 99 %
Brady Statistic AS VS Percent: 1 %
Brady Statistic RA Percent Paced: 1 %
Brady Statistic RV Percent Paced: 99 %
Date Time Interrogation Session: 20210327010950
Implantable Lead Implant Date: 20170608
Implantable Lead Implant Date: 20170608
Implantable Lead Location: 753859
Implantable Lead Location: 753860
Implantable Pulse Generator Implant Date: 20170608
Lead Channel Impedance Value: 440 Ohm
Lead Channel Impedance Value: 460 Ohm
Lead Channel Pacing Threshold Amplitude: 0.75 V
Lead Channel Pacing Threshold Amplitude: 1 V
Lead Channel Pacing Threshold Pulse Width: 0.4 ms
Lead Channel Pacing Threshold Pulse Width: 0.4 ms
Lead Channel Sensing Intrinsic Amplitude: 12 mV
Lead Channel Sensing Intrinsic Amplitude: 3.8 mV
Lead Channel Setting Pacing Amplitude: 2 V
Lead Channel Setting Pacing Amplitude: 2.5 V
Lead Channel Setting Pacing Pulse Width: 0.4 ms
Lead Channel Setting Sensing Sensitivity: 2.5 mV
Pulse Gen Model: 2272
Pulse Gen Serial Number: 7903881

## 2019-12-30 ENCOUNTER — Other Ambulatory Visit: Payer: Self-pay | Admitting: Family Medicine

## 2020-01-05 ENCOUNTER — Encounter: Payer: Self-pay | Admitting: Family Medicine

## 2020-01-05 ENCOUNTER — Other Ambulatory Visit: Payer: Self-pay | Admitting: Family Medicine

## 2020-01-08 MED ORDER — SPIRONOLACTONE 25 MG PO TABS: 25.0000 mg | ORAL_TABLET | Freq: Every day | ORAL | 1 refills | Status: DC

## 2020-02-26 ENCOUNTER — Encounter: Payer: Self-pay | Admitting: Obstetrics and Gynecology

## 2020-02-26 ENCOUNTER — Ambulatory Visit (INDEPENDENT_AMBULATORY_CARE_PROVIDER_SITE_OTHER): Payer: BC Managed Care – PPO | Admitting: Obstetrics and Gynecology

## 2020-02-26 ENCOUNTER — Other Ambulatory Visit: Payer: Self-pay

## 2020-02-26 VITALS — BP 126/84 | Ht 63.0 in | Wt 204.0 lb

## 2020-02-26 DIAGNOSIS — Z1211 Encounter for screening for malignant neoplasm of colon: Secondary | ICD-10-CM | POA: Diagnosis not present

## 2020-02-26 DIAGNOSIS — Z1339 Encounter for screening examination for other mental health and behavioral disorders: Secondary | ICD-10-CM | POA: Diagnosis not present

## 2020-02-26 DIAGNOSIS — Z01419 Encounter for gynecological examination (general) (routine) without abnormal findings: Secondary | ICD-10-CM | POA: Diagnosis not present

## 2020-02-26 DIAGNOSIS — Z1331 Encounter for screening for depression: Secondary | ICD-10-CM

## 2020-02-26 NOTE — Progress Notes (Signed)
Routine Annual Gynecology Examination   PCP: [name], MD  Chief Complaint  Patient presents with  . Annual Exam    pt only wants breast exam    History of Present Illness: Patient is a 59 y.o. G1P0101 presents for annual exam. The patient has no complaints today.   Menopausal bleeding: denies  Menopausal symptoms: denies  Breast symptoms: denies  Last pap smear: 09/2018.  Result Normal  Last mammogram: 6 years ago.  Result Normal. She has declined in the past due to her pacemaker being place and not wanting to interfere with the function of the pacemaker.   Past Medical History:  Diagnosis Date  . Anxiety   . Hyperlipidemia   . Hypertension   . Interstitial lung disease ([facility])   . Pollen allergies   . Squamous cell carcinoma 2014   skin cancer  . Subclinical hyperthyroidism 06/17/2014    Past Surgical History:  Procedure Laterality Date  . COSMETIC SURGERY    . EP IMPLANTABLE DEVICE N/A 02/26/2016   Procedure: Pacemaker Implant;  Surgeon: Will Jorja Loa, MD;  Location: [facility];  Service: Cardiovascular;  Laterality: N/A;  . TUBAL LIGATION      Prior to Admission medications   Medication Sig Start Date End Date Taking? Authorizing Provider  Ascorbic Acid (VITAMIN C) 1000 MG tablet Take 1,000 mg by mouth daily.   Yes [provider]  carvedilol (COREG) 25 MG tablet TAKE ONE TABLET BY MOUTH TWICE A DAY WITH MEALS 10/02/19  Yes Camnitz, Will Daphine Deutscher, MD  cholecalciferol (VITAMIN D3) 25 MCG (1000 UT) tablet Take 1,000 Units by mouth daily.   Yes [provider]  diazepam (VALIUM) 5 MG tablet Take 1 tablet (5 mg total) by mouth every 8 (eight) hours as needed for anxiety. 11/06/19  Yes Copland, Karleen Hampshire, MD  hydrochlorothiazide (HYDRODIURIL) 25 MG tablet TAKE ONE TABLET BY MOUTH DAILY 11/26/19  Yes Camnitz, Will Daphine Deutscher, MD  nortriptyline (PAMELOR) 25 MG capsule Take 1 capsule by mouth daily. 08/01/18  Yes [provider]   spironolactone (ALDACTONE) 25 MG tablet Take 1 tablet (25 mg total) by mouth daily. 01/08/20 07/06/20 Yes Copland, Karleen Hampshire, MD    Allergies  Allergen Reactions  . Codeine     REACTION: hives, itching  . Losartan Potassium Other (See Comments)  . Neomycin-Polymyxin-Dexameth     Swelling and itching of eye   . Oxycodone-Acetaminophen     REACTION: unspecified  . Paroxetine   . Sulfa Antibiotics Hives    Other reaction(s): RASH Other reaction(s): RASH   . Sulfonamide Derivatives     REACTION: unspecified  . Lisinopril Rash   Obstetric History: G1P0101  Social History   Socioeconomic History  . Marital status: Married    Spouse name: Not on file  . Number of children: 1  . Years of education: Not on file  . Highest education level: Not on file  Occupational History  . Occupation: house wife    Employer: [employer]  Tobacco Use  . Smoking status: Former Games developer  . Smokeless tobacco: Never Used  Substance and Sexual Activity  . Alcohol use: No  . Drug use: No  . Sexual activity: Yes    Birth control/protection: Post-menopausal  Other Topics Concern  . Not on file  Social History Narrative   Regular exercise-no   Social Determinants of Health   Financial Resource Strain:   . Difficulty of Paying Living Expenses:   Food Insecurity:   . Worried About  Running Out of Food in the Last Year:   . [place] in the Last Year:   Transportation Needs:   . Lack of Transportation (Medical):   [name] Lack of Transportation (Non-Medical):   Physical Activity:   . Days of Exercise per Week:   . Minutes of Exercise per Session:   Stress:   . Feeling of Stress :   Social Connections:   . Frequency of Communication with Friends and Family:   . Frequency of Social Gatherings with Friends and Family:   . Attends Religious Services:   . Active Member of Clubs or Organizations:   . Attends Archivist Meetings:   [name] Marital Status:   Intimate Partner Violence:   . Fear  of Current or Ex-Partner:   . Emotionally Abused:   [name] Physically Abused:   . Sexually Abused:     Family History  Problem Relation Age of Onset  . Anxiety disorder Mother   . Hypertension Mother   . Cancer Maternal Aunt        breast  . Heart failure Maternal Grandfather     Review of Systems  Constitutional: Negative.   HENT: Negative.   Eyes: Negative.   Respiratory: Negative.   Cardiovascular: Negative.   Gastrointestinal: Negative.   Genitourinary: Negative.   Musculoskeletal: Negative.   Skin: Negative.   Neurological: Negative.   Psychiatric/Behavioral: Negative.     Physical Exam Vitals: BP 126/84   Ht 5\' 3"  (1.6 m)   Wt 204 lb (92.5 kg)   BMI 36.14 kg/m   Physical Exam Constitutional:      General: She is not in acute distress.    Appearance: Normal appearance. She is well-developed.  HENT:     Head: Normocephalic and atraumatic.  Eyes:     General: No scleral icterus.    Conjunctiva/sclera: Conjunctivae normal.  Cardiovascular:     Rate and Rhythm: Normal rate. Rhythm irregular.     Heart sounds: No murmur. No friction rub. No gallop.   Pulmonary:     Effort: Pulmonary effort is normal. No respiratory distress.     Breath sounds: Normal breath sounds. No wheezing or rales.  Chest:     Breasts:        Right: No swelling, bleeding, inverted nipple, mass, nipple discharge, skin change or tenderness.        Left: No swelling, bleeding, inverted nipple, mass, nipple discharge, skin change or tenderness.     Comments: Pacemaker palpated on left chest wall Abdominal:     General: Bowel sounds are normal. There is no distension.     Palpations: Abdomen is soft. There is no mass.     Tenderness: There is no abdominal tenderness. There is no guarding or rebound.  Musculoskeletal:        General: Normal range of motion.     Cervical back: Normal range of motion and neck supple.  Lymphadenopathy:     Upper Body:     Right upper body: No supraclavicular,  axillary or pectoral adenopathy.     Left upper body: No supraclavicular, axillary or pectoral adenopathy.  Neurological:     General: No focal deficit present.     Mental Status: She is alert and oriented to person, place, and time.     Cranial Nerves: No cranial nerve deficit.  Skin:    General: Skin is warm and dry.     Findings: No erythema.  Psychiatric:  Mood and Affect: Mood normal.        Behavior: Behavior normal.        Judgment: Judgment normal.    Female chaperone present for pelvic and breast  portions of the physical exam  Results: AUDIT Questionnaire (screen for alcoholism): 2 PHQ-9: 0  Assessment and Plan:  59 y.o. G74P0101 female here for routine annual gynecologic examination  Plan: Problem List Items Addressed This Visit    None    Visit Diagnoses    Women's annual routine gynecological examination    -  Primary   Relevant Orders   Cologuard   Screening for depression       Screening for alcoholism       Screen for colon cancer       Relevant Orders   Cologuard      Screening: -- Blood pressure screen managed by PCP -- Colonoscopy - has refused to have colonoscopy.  Is willing to do Cologard.  -- Mammogram - declines due to pacemaker. -- Weight screening: has difficulty losing weight due to heart condition -- Depression screening negative (PHQ-9) -- Nutrition: normal -- cholesterol screening: per PCP -- osteoporosis screening: not due -- tobacco screening: not using -- alcohol screening: AUDIT questionnaire indicates low-risk usage. -- family history of breast cancer screening: done. not at high risk. -- no evidence of domestic violence or intimate partner violence. -- STD screening: gonorrhea/chlamydia NAAT not collected per patient request. -- pap smear not collected per ASCCP guidelines   Thomasene Mohair, MD 02/26/2020 2:02 PM

## 2020-02-28 ENCOUNTER — Other Ambulatory Visit: Payer: Self-pay | Admitting: Cardiology

## 2020-03-12 ENCOUNTER — Encounter: Payer: Self-pay | Admitting: Obstetrics and Gynecology

## 2020-03-13 ENCOUNTER — Ambulatory Visit (INDEPENDENT_AMBULATORY_CARE_PROVIDER_SITE_OTHER): Payer: BC Managed Care – PPO | Admitting: *Deleted

## 2020-03-13 DIAGNOSIS — I442 Atrioventricular block, complete: Secondary | ICD-10-CM | POA: Diagnosis not present

## 2020-03-14 ENCOUNTER — Telehealth: Payer: Self-pay

## 2020-03-14 LAB — CUP PACEART REMOTE DEVICE CHECK
Battery Remaining Longevity: 102 mo
Battery Remaining Percentage: 95.5 %
Battery Voltage: 2.96 V
Brady Statistic AP VP Percent: 1 %
Brady Statistic AP VS Percent: 0 %
Brady Statistic AS VP Percent: 99 %
Brady Statistic AS VS Percent: 1 %
Brady Statistic RA Percent Paced: 1 %
Brady Statistic RV Percent Paced: 99 %
Date Time Interrogation Session: 20210625145520
Implantable Lead Implant Date: 20170608
Implantable Lead Implant Date: 20170608
Implantable Lead Location: 753859
Implantable Lead Location: 753860
Implantable Pulse Generator Implant Date: 20170608
Lead Channel Impedance Value: 430 Ohm
Lead Channel Impedance Value: 490 Ohm
Lead Channel Pacing Threshold Amplitude: 0.75 V
Lead Channel Pacing Threshold Amplitude: 1 V
Lead Channel Pacing Threshold Pulse Width: 0.4 ms
Lead Channel Pacing Threshold Pulse Width: 0.4 ms
Lead Channel Sensing Intrinsic Amplitude: 12 mV
Lead Channel Sensing Intrinsic Amplitude: 3.9 mV
Lead Channel Setting Pacing Amplitude: 2 V
Lead Channel Setting Pacing Amplitude: 2.5 V
Lead Channel Setting Pacing Pulse Width: 0.4 ms
Lead Channel Setting Sensing Sensitivity: 2.5 mV
Pulse Gen Model: 2272
Pulse Gen Serial Number: 7903881

## 2020-03-14 NOTE — Telephone Encounter (Signed)
Left message for patient to remind of missed remote transmission.  

## 2020-03-17 NOTE — Progress Notes (Signed)
Remote pacemaker transmission.   

## 2020-04-01 ENCOUNTER — Other Ambulatory Visit: Payer: Self-pay

## 2020-04-01 ENCOUNTER — Telehealth: Payer: Self-pay

## 2020-04-01 ENCOUNTER — Encounter (HOSPITAL_COMMUNITY): Payer: Self-pay

## 2020-04-01 ENCOUNTER — Inpatient Hospital Stay (HOSPITAL_COMMUNITY)
Admission: EM | Admit: 2020-04-01 | Discharge: 2020-04-05 | DRG: 196 | Disposition: A | Discharge status: Home / Self Care | Payer: BC Managed Care – PPO | Attending: Internal Medicine | Admitting: Internal Medicine

## 2020-04-01 ENCOUNTER — Emergency Department (HOSPITAL_COMMUNITY): Payer: BC Managed Care – PPO

## 2020-04-01 DIAGNOSIS — R0902 Hypoxemia: Secondary | ICD-10-CM

## 2020-04-01 DIAGNOSIS — I442 Atrioventricular block, complete: Secondary | ICD-10-CM | POA: Diagnosis not present

## 2020-04-01 DIAGNOSIS — J9601 Acute respiratory failure with hypoxia: Secondary | ICD-10-CM | POA: Diagnosis present

## 2020-04-01 DIAGNOSIS — Z77120 Contact with and (suspected) exposure to mold (toxic): Secondary | ICD-10-CM

## 2020-04-01 DIAGNOSIS — Z885 Allergy status to narcotic agent status: Secondary | ICD-10-CM | POA: Diagnosis not present

## 2020-04-01 DIAGNOSIS — E669 Obesity, unspecified: Secondary | ICD-10-CM | POA: Diagnosis present

## 2020-04-01 DIAGNOSIS — Z888 Allergy status to other drugs, medicaments and biological substances status: Secondary | ICD-10-CM | POA: Diagnosis not present

## 2020-04-01 DIAGNOSIS — Z881 Allergy status to other antibiotic agents status: Secondary | ICD-10-CM | POA: Diagnosis not present

## 2020-04-01 DIAGNOSIS — I5189 Other ill-defined heart diseases: Secondary | ICD-10-CM

## 2020-04-01 DIAGNOSIS — E0590 Thyrotoxicosis, unspecified without thyrotoxic crisis or storm: Secondary | ICD-10-CM | POA: Diagnosis present

## 2020-04-01 DIAGNOSIS — F419 Anxiety disorder, unspecified: Secondary | ICD-10-CM | POA: Diagnosis not present

## 2020-04-01 DIAGNOSIS — Z9981 Dependence on supplemental oxygen: Secondary | ICD-10-CM | POA: Diagnosis not present

## 2020-04-01 DIAGNOSIS — I10 Essential (primary) hypertension: Secondary | ICD-10-CM | POA: Diagnosis present

## 2020-04-01 DIAGNOSIS — Z79899 Other long term (current) drug therapy: Secondary | ICD-10-CM | POA: Diagnosis not present

## 2020-04-01 DIAGNOSIS — Z20822 Contact with and (suspected) exposure to covid-19: Secondary | ICD-10-CM | POA: Diagnosis not present

## 2020-04-01 DIAGNOSIS — E785 Hyperlipidemia, unspecified: Secondary | ICD-10-CM | POA: Diagnosis not present

## 2020-04-01 DIAGNOSIS — Z85828 Personal history of other malignant neoplasm of skin: Secondary | ICD-10-CM | POA: Diagnosis not present

## 2020-04-01 DIAGNOSIS — E038 Other specified hypothyroidism: Secondary | ICD-10-CM | POA: Diagnosis not present

## 2020-04-01 DIAGNOSIS — J84112 Idiopathic pulmonary fibrosis: Secondary | ICD-10-CM | POA: Diagnosis not present

## 2020-04-01 DIAGNOSIS — I429 Cardiomyopathy, unspecified: Secondary | ICD-10-CM | POA: Diagnosis present

## 2020-04-01 DIAGNOSIS — I5022 Chronic systolic (congestive) heart failure: Secondary | ICD-10-CM | POA: Diagnosis not present

## 2020-04-01 DIAGNOSIS — Z6836 Body mass index (BMI) 36.0-36.9, adult: Secondary | ICD-10-CM | POA: Diagnosis not present

## 2020-04-01 DIAGNOSIS — Z950 Presence of cardiac pacemaker: Secondary | ICD-10-CM | POA: Diagnosis not present

## 2020-04-01 DIAGNOSIS — R0602 Shortness of breath: Secondary | ICD-10-CM | POA: Diagnosis not present

## 2020-04-01 DIAGNOSIS — Z882 Allergy status to sulfonamides status: Secondary | ICD-10-CM | POA: Diagnosis not present

## 2020-04-01 DIAGNOSIS — Z87891 Personal history of nicotine dependence: Secondary | ICD-10-CM

## 2020-04-01 DIAGNOSIS — J849 Interstitial pulmonary disease, unspecified: Secondary | ICD-10-CM | POA: Diagnosis not present

## 2020-04-01 DIAGNOSIS — J479 Bronchiectasis, uncomplicated: Secondary | ICD-10-CM | POA: Diagnosis not present

## 2020-04-01 DIAGNOSIS — R0600 Dyspnea, unspecified: Secondary | ICD-10-CM

## 2020-04-01 DIAGNOSIS — J432 Centrilobular emphysema: Secondary | ICD-10-CM | POA: Diagnosis not present

## 2020-04-01 LAB — CBC
HCT: 43.8 % (ref 36.0–46.0)
Hemoglobin: 15.3 g/dL — ABNORMAL HIGH (ref 12.0–15.0)
MCH: 31.5 pg (ref 26.0–34.0)
MCHC: 34.9 g/dL (ref 30.0–36.0)
MCV: 90.1 fL (ref 80.0–100.0)
Platelets: 277 10*3/uL (ref 150–400)
RBC: 4.86 MIL/uL (ref 3.87–5.11)
RDW: 12 % (ref 11.5–15.5)
WBC: 6.6 10*3/uL (ref 4.0–10.5)
nRBC: 0 % (ref 0.0–0.2)

## 2020-04-01 LAB — HEPATIC FUNCTION PANEL
ALT: 19 U/L (ref 0–44)
AST: 26 U/L (ref 15–41)
Albumin: 4.2 g/dL (ref 3.5–5.0)
Alkaline Phosphatase: 54 U/L (ref 38–126)
Bilirubin, Direct: <0.1 mg/dL (ref 0.0–0.2)
Total Bilirubin: 1 mg/dL (ref 0.3–1.2)
Total Protein: 7.8 g/dL (ref 6.5–8.1)

## 2020-04-01 LAB — BASIC METABOLIC PANEL
Anion gap: 14 (ref 5–15)
BUN: 9 mg/dL (ref 6–20)
CO2: 24 mmol/L (ref 22–32)
Calcium: 9.8 mg/dL (ref 8.9–10.3)
Chloride: 100 mmol/L (ref 98–111)
Creatinine, Ser: 0.68 mg/dL (ref 0.44–1.00)
GFR calc Af Amer: >60 mL/min (ref >60)
GFR calc non Af Amer: >60 mL/min (ref >60)
Glucose, Bld: 103 mg/dL — ABNORMAL HIGH (ref 70–99)
Potassium: 3.8 mmol/L (ref 3.5–5.1)
Sodium: 138 mmol/L (ref 135–145)

## 2020-04-01 LAB — TROPONIN I (HIGH SENSITIVITY)
Troponin I (High Sensitivity): 5 ng/L (ref <18)
Troponin I (High Sensitivity): 4 ng/L (ref <18)

## 2020-04-01 LAB — I-STAT BETA HCG BLOOD, ED (MC, WL, AP ONLY): I-stat hCG, quantitative: 9.8 m[IU]/mL — ABNORMAL HIGH (ref <5)

## 2020-04-01 LAB — D-DIMER, QUANTITATIVE: D-Dimer, Quant: 1.12 ug/mL-FEU — ABNORMAL HIGH (ref 0.00–0.50)

## 2020-04-01 NOTE — ED Triage Notes (Signed)
Pt reports sob and chest tightness since July 5th. Oxygen levels in high 80s at home. Pt has not had any COVID vaccines. 94% in triage, resp e.u

## 2020-04-01 NOTE — Telephone Encounter (Signed)
Patient contacted the office and states that she would like to speak to [facility]. I advised her I was a CMA and I could assist her, and she states that she has some questions about her oxygen saturation, and she is really particular about who she speaks with, and she just wants to talk to Lupita Leash.  I will route to her. Thanks!

## 2020-04-01 NOTE — Telephone Encounter (Signed)
Spoke with Anne Rush.  She states she has been having SOB and chest tightness mostly on exertion.  She states she also will get chest tightness without exertion.  She complains of a headache today.  She has a pulse ox reading at home at states her Sp02 run from 80-90.  I verified that she was looking at the oxygen level and not her pulse.  She confirmed it was the oxygen level she was looking at.  I advised Ladina to go be evaluated at the [facility] on [address]  Patient states understanding and will go there now to be evaluated.

## 2020-04-02 ENCOUNTER — Encounter (HOSPITAL_COMMUNITY): Payer: Self-pay | Admitting: Internal Medicine

## 2020-04-02 ENCOUNTER — Emergency Department (HOSPITAL_COMMUNITY): Payer: BC Managed Care – PPO

## 2020-04-02 ENCOUNTER — Inpatient Hospital Stay (HOSPITAL_COMMUNITY): Payer: BC Managed Care – PPO

## 2020-04-02 DIAGNOSIS — Z881 Allergy status to other antibiotic agents status: Secondary | ICD-10-CM | POA: Diagnosis not present

## 2020-04-02 DIAGNOSIS — E0590 Thyrotoxicosis, unspecified without thyrotoxic crisis or storm: Secondary | ICD-10-CM | POA: Diagnosis present

## 2020-04-02 DIAGNOSIS — I5189 Other ill-defined heart diseases: Secondary | ICD-10-CM | POA: Diagnosis not present

## 2020-04-02 DIAGNOSIS — I429 Cardiomyopathy, unspecified: Secondary | ICD-10-CM | POA: Diagnosis present

## 2020-04-02 DIAGNOSIS — J84112 Idiopathic pulmonary fibrosis: Secondary | ICD-10-CM | POA: Diagnosis present

## 2020-04-02 DIAGNOSIS — I442 Atrioventricular block, complete: Secondary | ICD-10-CM | POA: Diagnosis present

## 2020-04-02 DIAGNOSIS — J9601 Acute respiratory failure with hypoxia: Secondary | ICD-10-CM | POA: Diagnosis present

## 2020-04-02 DIAGNOSIS — R0602 Shortness of breath: Secondary | ICD-10-CM | POA: Diagnosis present

## 2020-04-02 DIAGNOSIS — Z79899 Other long term (current) drug therapy: Secondary | ICD-10-CM | POA: Diagnosis not present

## 2020-04-02 DIAGNOSIS — E785 Hyperlipidemia, unspecified: Secondary | ICD-10-CM | POA: Diagnosis present

## 2020-04-02 DIAGNOSIS — Z950 Presence of cardiac pacemaker: Secondary | ICD-10-CM

## 2020-04-02 DIAGNOSIS — Z885 Allergy status to narcotic agent status: Secondary | ICD-10-CM | POA: Diagnosis not present

## 2020-04-02 DIAGNOSIS — E669 Obesity, unspecified: Secondary | ICD-10-CM

## 2020-04-02 DIAGNOSIS — Z6836 Body mass index (BMI) 36.0-36.9, adult: Secondary | ICD-10-CM | POA: Diagnosis not present

## 2020-04-02 DIAGNOSIS — Z888 Allergy status to other drugs, medicaments and biological substances status: Secondary | ICD-10-CM | POA: Diagnosis not present

## 2020-04-02 DIAGNOSIS — Z77120 Contact with and (suspected) exposure to mold (toxic): Secondary | ICD-10-CM | POA: Diagnosis not present

## 2020-04-02 DIAGNOSIS — Z85828 Personal history of other malignant neoplasm of skin: Secondary | ICD-10-CM | POA: Diagnosis not present

## 2020-04-02 DIAGNOSIS — F419 Anxiety disorder, unspecified: Secondary | ICD-10-CM | POA: Diagnosis present

## 2020-04-02 DIAGNOSIS — I5022 Chronic systolic (congestive) heart failure: Secondary | ICD-10-CM | POA: Diagnosis not present

## 2020-04-02 DIAGNOSIS — Z87891 Personal history of nicotine dependence: Secondary | ICD-10-CM | POA: Diagnosis not present

## 2020-04-02 DIAGNOSIS — I10 Essential (primary) hypertension: Secondary | ICD-10-CM

## 2020-04-02 DIAGNOSIS — Z9981 Dependence on supplemental oxygen: Secondary | ICD-10-CM | POA: Diagnosis not present

## 2020-04-02 DIAGNOSIS — E038 Other specified hypothyroidism: Secondary | ICD-10-CM | POA: Diagnosis present

## 2020-04-02 DIAGNOSIS — J849 Interstitial pulmonary disease, unspecified: Secondary | ICD-10-CM | POA: Diagnosis not present

## 2020-04-02 DIAGNOSIS — Z20822 Contact with and (suspected) exposure to covid-19: Secondary | ICD-10-CM | POA: Diagnosis present

## 2020-04-02 DIAGNOSIS — Z882 Allergy status to sulfonamides status: Secondary | ICD-10-CM | POA: Diagnosis not present

## 2020-04-02 LAB — ECHOCARDIOGRAM COMPLETE
Height: 63 in
Weight: 3280 oz

## 2020-04-02 LAB — PREGNANCY, URINE: Preg Test, Ur: NEGATIVE

## 2020-04-02 LAB — LACTATE DEHYDROGENASE: LDH: 237 U/L — ABNORMAL HIGH (ref 98–192)

## 2020-04-02 LAB — HIV ANTIBODY (ROUTINE TESTING W REFLEX): HIV Screen 4th Generation wRfx: NONREACTIVE

## 2020-04-02 LAB — SARS CORONAVIRUS 2 BY RT PCR (HOSPITAL ORDER, PERFORMED IN ~~LOC~~ HOSPITAL LAB): SARS Coronavirus 2: NEGATIVE

## 2020-04-02 LAB — SEDIMENTATION RATE: Sed Rate: 8 mm/hr (ref 0–22)

## 2020-04-02 LAB — BRAIN NATRIURETIC PEPTIDE: B Natriuretic Peptide: 21.9 pg/mL (ref 0.0–100.0)

## 2020-04-02 LAB — TSH: TSH: 2.676 u[IU]/mL (ref 0.350–4.500)

## 2020-04-02 LAB — C-REACTIVE PROTEIN: CRP: 1.7 mg/dL — ABNORMAL HIGH (ref <1.0)

## 2020-04-02 LAB — PROCALCITONIN: Procalcitonin: <0.1 ng/mL

## 2020-04-02 MED ORDER — HYDROCHLOROTHIAZIDE 25 MG PO TABS
25.0000 mg | ORAL_TABLET | Freq: Every day | ORAL | Status: DC
  Administered 2020-04-02 – 2020-04-03 (×2): 25 mg via ORAL
  Filled 2020-04-02 (×3): qty 1

## 2020-04-02 MED ORDER — IOHEXOL 350 MG/ML SOLN
75.0000 mL | Freq: Once | INTRAVENOUS | Status: AC | PRN
  Administered 2020-04-02: 75 mL via INTRAVENOUS

## 2020-04-02 MED ORDER — ALBUTEROL SULFATE (2.5 MG/3ML) 0.083% IN NEBU: 2.5000 mg | INHALATION_SOLUTION | Freq: Four times a day (QID) | RESPIRATORY_TRACT | Status: DC | PRN

## 2020-04-02 MED ORDER — SODIUM CHLORIDE 0.9% FLUSH
3.0000 mL | Freq: Two times a day (BID) | INTRAVENOUS | Status: DC
  Administered 2020-04-02 – 2020-04-05 (×6): 3 mL via INTRAVENOUS

## 2020-04-02 MED ORDER — METHYLPREDNISOLONE SODIUM SUCC 125 MG IJ SOLR
60.0000 mg | Freq: Two times a day (BID) | INTRAMUSCULAR | Status: DC
  Administered 2020-04-02 – 2020-04-04 (×4): 60 mg via INTRAVENOUS
  Filled 2020-04-02 (×4): qty 2

## 2020-04-02 MED ORDER — GUAIFENESIN ER 600 MG PO TB12
600.0000 mg | ORAL_TABLET | Freq: Two times a day (BID) | ORAL | Status: DC
  Administered 2020-04-02 – 2020-04-05 (×6): 600 mg via ORAL
  Filled 2020-04-02 (×7): qty 1

## 2020-04-02 MED ORDER — SPIRONOLACTONE 25 MG PO TABS
25.0000 mg | ORAL_TABLET | Freq: Every day | ORAL | Status: DC
  Administered 2020-04-02 – 2020-04-05 (×4): 25 mg via ORAL
  Filled 2020-04-02 (×5): qty 1

## 2020-04-02 MED ORDER — NORTRIPTYLINE HCL 10 MG PO CAPS
10.0000 mg | ORAL_CAPSULE | Freq: Every day | ORAL | Status: DC
  Administered 2020-04-02 – 2020-04-04 (×3): 10 mg via ORAL
  Filled 2020-04-02 (×4): qty 1

## 2020-04-02 MED ORDER — CARVEDILOL 25 MG PO TABS
25.0000 mg | ORAL_TABLET | Freq: Two times a day (BID) | ORAL | Status: DC
  Administered 2020-04-02 – 2020-04-05 (×7): 25 mg via ORAL
  Filled 2020-04-02 (×7): qty 1
  Filled 2020-04-02: qty 8
  Filled 2020-04-02: qty 1
  Filled 2020-04-02: qty 8

## 2020-04-02 MED ORDER — ACETAMINOPHEN 325 MG PO TABS
650.0000 mg | ORAL_TABLET | Freq: Four times a day (QID) | ORAL | Status: DC | PRN
  Administered 2020-04-03 – 2020-04-05 (×4): 650 mg via ORAL
  Filled 2020-04-02 (×4): qty 2

## 2020-04-02 MED ORDER — LORAZEPAM 2 MG/ML IJ SOLN
1.0000 mg | Freq: Once | INTRAMUSCULAR | Status: AC
  Administered 2020-04-02: 1 mg via INTRAVENOUS
  Filled 2020-04-02 (×2): qty 1

## 2020-04-02 MED ORDER — ENOXAPARIN SODIUM 40 MG/0.4ML ~~LOC~~ SOLN
40.0000 mg | SUBCUTANEOUS | Status: DC
  Administered 2020-04-02 – 2020-04-05 (×4): 40 mg via SUBCUTANEOUS
  Filled 2020-04-02 (×4): qty 0.4

## 2020-04-02 MED ORDER — DIAZEPAM 5 MG PO TABS
5.0000 mg | ORAL_TABLET | Freq: Three times a day (TID) | ORAL | Status: DC | PRN
  Administered 2020-04-02 – 2020-04-04 (×4): 5 mg via ORAL
  Filled 2020-04-02 (×4): qty 1

## 2020-04-02 MED ORDER — ACETAMINOPHEN 650 MG RE SUPP: 650.0000 mg | Freq: Four times a day (QID) | RECTAL | Status: DC | PRN

## 2020-04-02 NOTE — ED Notes (Signed)
2L of O2 via nasal cannula applied.

## 2020-04-02 NOTE — Consult Note (Signed)
NAME:  Anne Rush, MRN:  [id number], DOB:  [birth year], LOS: 0 ADMISSION DATE:  04/01/2020, CONSULTATION DATE:  04/02/20 REFERRING MD:  Dr. Drusilla Kanner, CHIEF COMPLAINT: 04/02/20  Brief History   59 y/o F, former smoker,  who presented to [facility] on 7/13 with worsening shortness of breath.    History of present illness   59 y/o F, former smoker, who presented to [facility] on 7/13 with worsening shortness of breath.   The patient reports her increase in shortness of breath began around the first of July.  She had noted her oxygen levels to be in the high 80's at home.  She reports progressively increased shortness of breath, dyspnea with exertion and difficulty walking a flight of stairs.  She has also noted a tightness in the middle of her chest.  She has not had a COVID vaccine.    The patient is a Conservator, museum/gallery.  She is a former smoker quit 2012 after diagnosis of ILD.  She was previously followed by Pulmonary at [facility] (Dr. Ramond Dial, last seen in 2016) for ground glass opacities but has not seen them in several years due to financial constraints. She was supposed to have a lung biopsy. Her symptoms at the time were thought to be RBILD.  At that time she quit smoking and her FEV1 increased from 85 to >90 and infiltrates improved on CT.   ER evaluation included a CTA chest which was negative for PE but showed worsening ILD from her prior CT scan.  Troponin flat.    PCCM consulted for pulmonary evaluation.  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  Hobbies: none Travel: none recent   No heavy dust exposures  No bird exposures    Past Medical History  ILD - not on home O2 Seasonal Allergies HTN  HLD  CHB s/p Pacemaker  Anxiety  Squamous Cell Carcinoma  Subclinical Hypothyroidism   Significant Hospital Events   7/13 Admit  Consults:    Procedures:    Significant Diagnostic Tests:  CTA Chest 7/14 >> negative for PE, chronic interstitial lung disease  with substantial progression since 2012  Micro Data:  COVID 7/14 >> negative   Antimicrobials:    Interim history/subjective:  As above   Objective   Blood pressure 119/87, pulse 91, temperature 98 F (36.7 C), temperature source Oral, resp. rate 20, height 5\' 3"  (1.6 m), weight 93 kg, SpO2 94 %.       No intake or output data in the 24 hours ending 04/02/20 1540 Filed Weights   04/01/20 1441  Weight: 93 kg    Examination: General: middle aged, obese fm, comfortable in chair  HEENT: MM pink/moist Neuro: alert, following commands  CV: s1s2  Rrr, no mrg  PULM: no crackles, no wheeze GI: soft, nt nd  Extremities: no edema, no rash  Skin: no rash   Resolved Hospital Problem list     Assessment & Plan:   Acute exacerbation of ILD  Previous smoker, initially felt to be RBILD in 2012  1:160 speckled ANA in 2012  I wonder if she has IPAF, with a more NSIP pattern now  - CT with areas of GGO - no infectious symptoms  Plan: Start solumedrol 60mg  BID Resend labs for auto-immune workup Hold off on abx at this time.  We will have one of our ILD docs weigh in tomorrow.  She may benefit from OLBx at somepoint.   Best practice:  Diet: Per  primary  DVT prophylaxis: lovenox  GI prophylaxis: n/a Glucose control: per primary  Mobility: as tolerated  Code Status: Full Code  Family Communication: daugther at bedside  Disposition: Per TRH  Labs   CBC: Recent Labs  Lab 04/01/20 1449  WBC 6.6  HGB 15.3*  HCT 43.8  MCV 90.1  PLT 277    Basic Metabolic Panel: Recent Labs  Lab 04/01/20 1449  NA 138  K 3.8  CL 100  CO2 24  GLUCOSE 103*  BUN 9  CREATININE 0.68  CALCIUM 9.8   GFR: Estimated Creatinine Clearance: 84 mL/min (by C-G formula based on SCr of 0.68 mg/dL). Recent Labs  Lab 04/01/20 1449  WBC 6.6    Liver Function Tests: Recent Labs  Lab 04/01/20 1653  AST 26  ALT 19  ALKPHOS 54  BILITOT 1.0  PROT 7.8  ALBUMIN 4.2   No results for  input(s): LIPASE, AMYLASE in the last 168 hours. No results for input(s): AMMONIA in the last 168 hours.  ABG No results found for: PHART, PCO2ART, PO2ART, HCO3, TCO2, ACIDBASEDEF, O2SAT   Coagulation Profile: No results for input(s): INR, PROTIME in the last 168 hours.  Cardiac Enzymes: No results for input(s): CKTOTAL, CKMB, CKMBINDEX, TROPONINI in the last 168 hours.  HbA1C: Hgb A1c MFr Bld  Date/Time Value Ref Range Status  04/13/2019 08:12 AM 5.9 4.6 - 6.5 % Final    Comment:    Glycemic Control Guidelines for People with Diabetes:Non Diabetic:  <6%Goal of Therapy: <7%Additional Action Suggested:  >8%   01/09/2018 08:49 AM 5.8 4.6 - 6.5 % Final    Comment:    Glycemic Control Guidelines for People with Diabetes:Non Diabetic:  <6%Goal of Therapy: <7%Additional Action Suggested:  >8%     CBG: No results for input(s): GLUCAP in the last 168 hours.  Review of Systems: Positives in [place]  Gen: Denies fever, chills, weight change, fatigue, night sweats HEENT: Denies blurred vision, double vision, hearing loss, tinnitus, sinus congestion, rhinorrhea, sore throat, neck stiffness, dysphagia PULM: Denies shortness of breath, cough, sputum production, hemoptysis, wheezing CV: Denies chest pain, edema, orthopnea, paroxysmal nocturnal dyspnea, palpitations GI: Denies abdominal pain, nausea, vomiting, diarrhea, hematochezia, melena, constipation, change in bowel habits GU: Denies dysuria, hematuria, polyuria, oliguria, urethral discharge Endocrine: Denies hot or cold intolerance, polyuria, polyphagia or appetite change Derm: Denies rash, dry skin, scaling or peeling skin change Heme: Denies easy bruising, bleeding, bleeding gums Neuro: Denies headache, numbness, weakness, slurred speech, loss of memory or consciousness  Past Medical History  She,  has a past medical history of Anxiety, Family history of ovarian cancer, Hyperlipidemia, Hypertension, Interstitial lung disease (HCC), Pollen  allergies, Squamous cell carcinoma (2014), and Subclinical hyperthyroidism (06/17/2014).   Surgical History    Past Surgical History:  Procedure Laterality Date  . COSMETIC SURGERY    . EP IMPLANTABLE DEVICE N/A 02/26/2016   Procedure: Pacemaker Implant;  Surgeon: Will Jorja Loa, MD;  Location: [facility] INVASIVE CV LAB;  Service: Cardiovascular;  Laterality: N/A;  . TUBAL LIGATION       Social History   reports that she has quit smoking. She has never used smokeless tobacco. She reports that she does not drink alcohol and does not use drugs.   Family History   Her family history includes Anxiety disorder in her mother; Cancer in her maternal aunt; Heart failure in her maternal grandfather; Hypertension in her mother; Ovarian cancer (age of onset: 6) in her mother.   Allergies Allergies  Allergen  Reactions  . Codeine Hives and Itching  . Losartan Potassium Other (See Comments)  . Neomycin-Polymyxin-Dexameth Itching, Swelling and Other (See Comments)    Swelling and itching of eye   . Oxycodone-Acetaminophen Other (See Comments)    REACTION: unspecified  . Paroxetine Other (See Comments)    unknown  . Sulfonamide Derivatives Other (See Comments)    REACTION: unspecified  . Lisinopril Rash  . Sulfa Antibiotics Hives and Rash          Home Medications  Prior to Admission medications   Medication Sig Start Date End Date Taking? Authorizing Provider  Ascorbic Acid (VITAMIN C) 1000 MG tablet Take 1,000 mg by mouth daily.   Yes [provider]  carvedilol (COREG) 25 MG tablet TAKE ONE TABLET BY MOUTH TWICE A DAY WITH MEALS Patient taking differently: Take 25 mg by mouth 2 (two) times daily with a meal.  02/29/20  Yes Camnitz, Will Daphine Deutscher, MD  cholecalciferol (VITAMIN D3) 25 MCG (1000 UT) tablet Take 1,000 Units by mouth daily.   Yes [provider]  diazepam (VALIUM) 5 MG tablet Take 1 tablet (5 mg total) by mouth every 8 (eight) hours as needed for anxiety. Patient  taking differently: Take 5 mg by mouth as needed for anxiety.  11/06/19  Yes Copland, Karleen Hampshire, MD  hydrochlorothiazide (HYDRODIURIL) 25 MG tablet TAKE ONE TABLET BY MOUTH DAILY Patient taking differently: Take 25 mg by mouth daily.  11/26/19  Yes Camnitz, Will Daphine Deutscher, MD  MAGNESIUM PO Take 1 tablet by mouth daily.   Yes [provider]  nortriptyline (PAMELOR) 10 MG capsule Take 10 mg by mouth at bedtime.  08/01/18  Yes [provider]  spironolactone (ALDACTONE) 25 MG tablet Take 1 tablet (25 mg total) by mouth daily. 01/08/20 07/06/20 Yes Copland, Karleen Hampshire, MD  Zinc 30 MG TABS Take 30 mg by mouth daily.   Yes [provider]     Josephine Igo, DO [facility] 04/02/2020 5:16 PM

## 2020-04-02 NOTE — Progress Notes (Signed)
  Echocardiogram 2D Echocardiogram has been performed.  Chealsea Paske G Maximos Zayas 04/02/2020, 1:55 PM

## 2020-04-02 NOTE — H&P (Addendum)
History and Physical    Anne Rush VZD:[id number] DOB: 14-Nov-1960 DOA: 04/01/2020  Referring MD/NP/PA: Alvester Chou, MD PCP: [name], MD  Patient coming from: Home   Chief Complaint: Shortness of breath  I have personally briefly reviewed patient's old medical records in [facility]   HPI: Anne Rush is a 59 y.o. female with medical history significant of hypertension, hyperlipidemia, anxiety, interstitial lung disease, complete heart block s/p PM who presented with complaints of shortness of breath over the last 9 days.  Walking up a flight of stairs in her home made her extremely short of breath to the point where she had to rest in order to catch her breath.  She noted associated symptoms with substernal chest tightness.  Associated symptoms include headache and a cough at least for the last 2 days with sputum production.  Denies having any fevers, nausea, vomiting, wheezing, change in weight, lower extremity swelling, or calf pain.  Patient had been checking her pulse oximetry at home and noted that O2 saturations would range from 85% to 92%.  Normally she was not requiring oxygen to maintain her O2 saturations at baseline.  She has been more sedentary and notes that she has been more fatigued.  She has approximately a 40 smoking pack year history, but quit last year.  Patient had previously been diagnosed with interstitial lung disease with respiratory bronchiolitis interstitial lung disease by Dr. Toma Deiters pulmonology at [facility].  She last saw him back in 2016.  At home she reports that she is not on any inhalers or treatments.  ED Course: Upon admission into the emergency department patient was seen to be afebrile, pulse 46-102, and O2 saturations as low as 89% improved on 2 L of nasal cannula oxygen.  Labs relatively unremarkable.  CT angiogram of the chest significant for progression of interstitial lung disease.  TRH called to admit.  Review of Systems   Constitutional: Positive for malaise/fatigue. Negative for fever and weight loss.  HENT: Negative for ear discharge and sinus pain.   Eyes: Negative for pain.  Respiratory: Positive for cough and shortness of breath.   Cardiovascular: Positive for chest pain. Negative for leg swelling.  Gastrointestinal: Negative for abdominal pain, nausea and vomiting.  Genitourinary: Negative for dysuria and frequency.  Musculoskeletal: Negative for falls and myalgias.  Skin: Negative for rash.  Neurological: Positive for headaches. Negative for loss of consciousness and weakness.  Psychiatric/Behavioral: Negative for substance abuse.    Past Medical History:  Diagnosis Date  . Anxiety   . Family history of ovarian cancer   . Hyperlipidemia   . Hypertension   . Interstitial lung disease (HCC)   . Pollen allergies   . Squamous cell carcinoma 2014   skin cancer  . Subclinical hyperthyroidism 06/17/2014    Past Surgical History:  Procedure Laterality Date  . COSMETIC SURGERY    . EP IMPLANTABLE DEVICE N/A 02/26/2016   Procedure: Pacemaker Implant;  Surgeon: Will Jorja Loa, MD;  Location: [facility] INVASIVE CV LAB;  Service: Cardiovascular;  Laterality: N/A;  . TUBAL LIGATION       reports that she has quit smoking. She has never used smokeless tobacco. She reports that she does not drink alcohol and does not use drugs.  Allergies  Allergen Reactions  . Codeine Hives and Itching  . Losartan Potassium Other (See Comments)  . Neomycin-Polymyxin-Dexameth Itching, Swelling and Other (See Comments)    Swelling and itching of eye   . Oxycodone-Acetaminophen Other (  See Comments)    REACTION: unspecified  . Paroxetine Other (See Comments)    unknown  . Sulfonamide Derivatives Other (See Comments)    REACTION: unspecified  . Lisinopril Rash  . Sulfa Antibiotics Hives and Rash         Family History  Problem Relation Age of Onset  . Anxiety disorder Mother   . Hypertension Mother   .  Ovarian cancer Mother 57  . Cancer Maternal Aunt        breast  . Heart failure Maternal Grandfather     Prior to Admission medications   Medication Sig Start Date End Date Taking? Authorizing Provider  Ascorbic Acid (VITAMIN C) 1000 MG tablet Take 1,000 mg by mouth daily.   Yes [provider]  carvedilol (COREG) 25 MG tablet TAKE ONE TABLET BY MOUTH TWICE A DAY WITH MEALS Patient taking differently: Take 25 mg by mouth 2 (two) times daily with a meal.  02/29/20  Yes Camnitz, Will Daphine Deutscher, MD  cholecalciferol (VITAMIN D3) 25 MCG (1000 UT) tablet Take 1,000 Units by mouth daily.   Yes [provider]  diazepam (VALIUM) 5 MG tablet Take 1 tablet (5 mg total) by mouth every 8 (eight) hours as needed for anxiety. Patient taking differently: Take 5 mg by mouth as needed for anxiety.  11/06/19  Yes Copland, Karleen Hampshire, MD  hydrochlorothiazide (HYDRODIURIL) 25 MG tablet TAKE ONE TABLET BY MOUTH DAILY Patient taking differently: Take 25 mg by mouth daily.  11/26/19  Yes Camnitz, Will Daphine Deutscher, MD  MAGNESIUM PO Take 1 tablet by mouth daily.   Yes [provider]  nortriptyline (PAMELOR) 10 MG capsule Take 10 mg by mouth at bedtime.  08/01/18  Yes [provider]  spironolactone (ALDACTONE) 25 MG tablet Take 1 tablet (25 mg total) by mouth daily. 01/08/20 07/06/20 Yes Copland, Karleen Hampshire, MD  Zinc 30 MG TABS Take 30 mg by mouth daily.   Yes [provider]    Physical Exam:  Constitutional: Obese female who appears to be in no acute distress at this time Vitals:   04/02/20 0236 04/02/20 0529 04/02/20 0645 04/02/20 0736  BP: (!) 147/109 (!) 150/66 (!) 160/88 (!) 142/91  Pulse: (!) 101 (!) 46 91 91  Resp: 16 17 18 20   Temp:  97.8 F (36.6 C) 98 F (36.7 C)   TempSrc:  Oral Oral   SpO2: (!) 89%  93% 96%  Weight:      Height:       Eyes: PERRL, lids and conjunctivae normal ENMT: Mucous membranes are moist. Posterior pharynx clear of any exudate or lesions.    Neck: normal, supple, no masses, no thyromegaly.  Mild JVD appreciated Respiratory: Inspiratory and expiratory crackles appreciated.  Patient currently maintaining O2 saturations on 2 L of nasal cannula oxygen. Cardiovascular: Regular rate and rhythm, no murmurs / rubs / gallops.  Trace lower extremity edema. 2+ pedal pulses. No carotid bruits.   Abdomen: no tenderness, no masses palpated. No hepatosplenomegaly. Bowel sounds positive.  Musculoskeletal: no clubbing / cyanosis. No joint deformity upper and lower extremities. Good ROM, no contractures. Normal muscle tone.  Skin: no rashes, lesions, ulcers. No induration Neurologic: CN 2-12 grossly intact. Sensation intact, DTR normal. Strength 5/5 in all 4.  Psychiatric: Normal judgment and insight. Alert and oriented x 3. Normal mood.     Labs on Admission: I have personally reviewed following labs and imaging studies  CBC: Recent Labs  Lab 04/01/20 1449  WBC 6.6  HGB  15.3*  HCT 43.8  MCV 90.1  PLT 277   Basic Metabolic Panel: Recent Labs  Lab 04/01/20 1449  NA 138  K 3.8  CL 100  CO2 24  GLUCOSE 103*  BUN 9  CREATININE 0.68  CALCIUM 9.8   GFR: Estimated Creatinine Clearance: 84 mL/min (by C-G formula based on SCr of 0.68 mg/dL). Liver Function Tests: Recent Labs  Lab 04/01/20 1653  AST 26  ALT 19  ALKPHOS 54  BILITOT 1.0  PROT 7.8  ALBUMIN 4.2   No results for input(s): LIPASE, AMYLASE in the last 168 hours. No results for input(s): AMMONIA in the last 168 hours. Coagulation Profile: No results for input(s): INR, PROTIME in the last 168 hours. Cardiac Enzymes: No results for input(s): CKTOTAL, CKMB, CKMBINDEX, TROPONINI in the last 168 hours. BNP (last 3 results) No results for input(s): PROBNP in the last 8760 hours. HbA1C: No results for input(s): HGBA1C in the last 72 hours. CBG: No results for input(s): GLUCAP in the last 168 hours. Lipid Profile: No results for input(s): CHOL, HDL, LDLCALC, TRIG,  CHOLHDL, LDLDIRECT in the last 72 hours. Thyroid Function Tests: No results for input(s): TSH, T4TOTAL, FREET4, T3FREE, THYROIDAB in the last 72 hours. Anemia Panel: No results for input(s): VITAMINB12, FOLATE, FERRITIN, TIBC, IRON, RETICCTPCT in the last 72 hours. Urine analysis:    Component Value Date/Time   COLORURINE yellow 08/12/2009 1128   APPEARANCEUR Clear 04/12/2018 1050   LABSPEC 1.010 08/12/2009 1128   PHURINE 7.5 08/12/2009 1128   GLUCOSEU Negative 04/12/2018 1050   HGBUR trace-intact 08/12/2009 1128   BILIRUBINUR Negative 04/12/2018 1050   PROTEINUR Negative 04/12/2018 1050   UROBILINOGEN 0.2 08/12/2009 1128   NITRITE Negative 04/12/2018 1050   NITRITE negative 08/12/2009 1128   LEUKOCYTESUR Negative 04/12/2018 1050   Sepsis Labs: Recent Results (from the past 240 hour(s))  SARS Coronavirus 2 by RT PCR (hospital order, performed in [facility] hospital lab) Nasopharyngeal Nasopharyngeal Swab     Status: None   Collection Time: 04/02/20 12:40 AM   Specimen: Nasopharyngeal Swab  Result Value Ref Range Status   SARS Coronavirus 2 NEGATIVE NEGATIVE Final    Comment: (NOTE) SARS-CoV-2 target nucleic acids are NOT DETECTED.  The SARS-CoV-2 RNA is generally detectable in upper and lower respiratory specimens during the acute phase of infection. The lowest concentration of SARS-CoV-2 viral copies this assay can detect is 250 copies / mL. A negative result does not preclude SARS-CoV-2 infection and should not be used as the sole basis for treatment or other patient management decisions.  A negative result may occur with improper specimen collection / handling, submission of specimen other than nasopharyngeal swab, presence of viral mutation(s) within the areas targeted by this assay, and inadequate number of viral copies (<250 copies / mL). A negative result must be combined with clinical observations, patient history, and epidemiological information.  Fact Sheet for  Patients:   BoilerBrush.com.cy  Fact Sheet for Healthcare Providers: https://pope.com/  This test is not yet approved or  cleared by the [place] FDA and has been authorized for detection and/or diagnosis of SARS-CoV-2 by FDA under an Emergency Use Authorization (EUA).  This EUA will remain in effect (meaning this test can be used) for the duration of the COVID-19 declaration under Section 564(b)(1) of the Act, 21 U.S.C. section 360bbb-3(b)(1), unless the authorization is terminated or revoked sooner.  Performed at [facility], [address] [place], [place] [address]  Radiological Exams on Admission: DG Chest 2 View  Result Date: 04/01/2020 CLINICAL DATA:  Chest pain, shortness of breath EXAM: CHEST - 2 VIEW COMPARISON:  07/04/2019 FINDINGS: Left-sided implanted cardiac device. The heart size and mediastinal contours are within normal limits. Chronically coarsened bilateral interstitial markings, similar in appearance to prior. No definite superimposed airspace opacity. No pleural effusion or pneumothorax. The visualized skeletal structures are unremarkable. IMPRESSION: Chronic interstitial lung changes. No definite superimposed acute cardiopulmonary process. Electronically Signed   By: Duanne Guess D.O.   On: 04/01/2020 15:24   CT Angio Chest PE W and/or Wo Contrast  Result Date: 04/02/2020 CLINICAL DATA:  59 year old female with shortness of breath and chest tightness for 8 days. EXAM: CT ANGIOGRAPHY CHEST WITH CONTRAST TECHNIQUE: Multidetector CT imaging of the chest was performed using the standard protocol during bolus administration of intravenous contrast. Multiplanar CT image reconstructions and MIPs were obtained to evaluate the vascular anatomy. CONTRAST:  32mL OMNIPAQUE IOHEXOL 350 MG/ML SOLN COMPARISON:  Chest radiographs yesterday. High-resolution chest CT 10/08/2010. FINDINGS: Cardiovascular: Good contrast  bolus timing in the pulmonary arterial tree. No focal filling defect identified in the pulmonary arteries to suggest acute pulmonary embolism. Left chest cardiac pacemaker device. Cardiac size at the upper limits of normal. No pericardial effusion. Negative visible aorta aside from mild atherosclerosis. No calcified coronary artery atherosclerosis is evident. Mediastinum/Nodes: Negative.  No lymphadenopathy. Lungs/Pleura: Major airways are patent. There is generalized pulmonary ground-glass opacity with bilateral increased subpleural opacity and upper lobe architectural distortion perhaps due to honeycombing (series 6, image 34). Very early similar changes were present on a 2012 chest CT where UIP was favored. Peripheral and dependent patchy opacity at both lung bases. No pleural effusion. No consolidation. Upper Abdomen: Small chronic rim calcified nodule in the gastrohepatic ligament is most compatible with the chronic postinflammatory lymph node, mildly increased in conspicuity since 2012. Otherwise negative visible upper abdomen including the visible liver, gallbladder, spleen, adrenal glands. Musculoskeletal: No acute osseous abnormality identified. Incomplete segmentation of T2-T3. Review of the MIP images confirms the above findings. IMPRESSION: 1. Negative for acute pulmonary embolus. 2. Chronic interstitial lung disease with substantial progression since a 2012 high-resolution CT. Superimposed acute respiratory infection would be difficult to exclude but is not favored. Electronically Signed   By: Odessa Fleming M.D.   On: 04/02/2020 02:10    EKG: Independently reviewed.  Paced rhythm at 101 bpm  Assessment/Plan Acute respiratory failure with hypoxia Suspected idiopathic pulmonary fibrosis exacerbation: Acute.  Patient presents with complaints of progressively worsening shortness of breath since June 5.  CT scan of the chest concerning for substantial progression of disease since previous studies done in  2012.  Suspect symptoms secondary to acute exacerbation of patient's pulmonary fibrosis although underlying infection cannot be ruled out on CT. -Admit to a medical telemetry bed -Continuous pulse oximetry with nasal cannula oxygen to maintain O2 saturation  -Check BNP and procalcitonin -Added on TSH  -Check echocardiogram -Albuterol nebs as needed for shortness of breath/wheezing  -Held off starting steroids until seen by PCCM -Dr. Marchelle Gearing pulmonary critical care consulted,  will follow for further recommendations  Chest pain: Patient did report intermittent chest pain with symptoms.  Troponins have been negative x2 and EKG otherwise shows a paced rhythm. -May warrant further work-up depending on echocardiogram  Complete heart block s/p pacemaker: Patient follows with Dr. Elberta Fortis in outpatient setting -Pacemaker to be interrogated  Diastolic dysfunction: Patient appears to be compensated at this time.  Last EF  noted to be 50-55% with grade 1 diastolic dysfunction back in 2018. -Strict intake and output -Daily weight -Follow-up repeat echo cardiac  Essential Hypertension: Home blood pressure medications include Coreg 25 mg twice daily with meals, hydrochlorothiazide 25 mg nightly,  Spironolactone 25 mg daily. -Continue home regimen  Anxiety: Home medications include Valium every 8 hours as needed for anxiety, and nortriptyline 10 mg q hs -Continue nortriptyline  Obesity: BMI 36.31 kg/m -Encourage weight loss with use of diet and exercise  DVT prophylaxis:lovenox  Code Status: Full  Family Communication: Daughter updated at bedside Disposition Plan: Likely discharge home in 1 to 2 days Consults called: PCCM Admission status: inpatient   Clydie Braun MD [facility] Pager [phone]   If 7PM-7AM, please contact night-coverage [email or web address] Password [email or web address]  04/02/2020, 9:54 AM

## 2020-04-02 NOTE — ED Notes (Signed)
Pt ambulated from lobby to assigned bed with no assistance, O2 @ 93% on room air

## 2020-04-02 NOTE — ED Provider Notes (Signed)
[facility] EMERGENCY DEPARTMENT Provider Note   CSN: [id number] Arrival date & time: 04/01/20  1433     History Chief Complaint  Patient presents with  . Shortness of Breath  . Chest Pain    Anne Rush is a 59 y.o. female with history of chronic interstitial lung disease (not on home O2, baseline O2 96% at home), hypertension, complete heart block status post pacemaker, hyperlipidemia, anxiety, presented to the emergency department with shortness of breath and headache.  Patient ports onset of symptoms around 03/24/20.  She reports that she has had progressively worsening shortness of breath for the past week or 2.  In particular she feels like she is winded trying to walk up the stairs.  She also describes a feeling of tightness in the middle of her chest.  She said this was a similar sensation when she was diagnosed with her ILD many years ago.  She currently has very minimal chest tightness.  She does report that she has had a mild persistent headache for the past week.  She denies any fevers or chills.  She denies any nausea, vomiting, diarrhea, loss of smell or taste.  She has not received Covid vaccines.  She is a former smoker but quit several years ago.  She formerly followed with a pulmonologist at [facility], but has not seen them in several years due to financial constraints.  She does not use any breathing treatments at home for her interstitial lung disease.  She reports that "everything has been fine" with her lungs for several years, and she's had no problems with breathing in general.  Last echo on 03/29/17 showing EF 50-55%, grade 1 diastolic dysfunction, apical septal hypokinesis.  Cardiologist is Dr Loman Brooklyn   HPI     Past Medical History:  Diagnosis Date  . Anxiety   . Family history of ovarian cancer   . Hyperlipidemia   . Hypertension   . Interstitial lung disease (HCC)   . Pollen allergies   . Squamous cell carcinoma 2014   skin  cancer  . Subclinical hyperthyroidism 06/17/2014    Patient Active Problem List   Diagnosis Date Noted  . Acute respiratory failure with hypoxia (HCC) 04/02/2020  . Pacemaker 10/01/2018  . Vitamin D deficiency 01/11/2018  . Allergic angioedema 03/17/2016  . Complete heart block (HCC) 02/26/2016  . Obesity (BMI 30-39.9) 04/28/2014  . Postinflammatory pulmonary fibrosis (HCC) 08/27/2010  . Former tobacco use 08/28/2008  . Hyperlipidemia LDL goal <100 06/21/2007  . GAD (generalized anxiety disorder) 06/05/2007  . Essential hypertension 06/05/2007  . ALLERGIC RHINITIS 06/05/2007    Past Surgical History:  Procedure Laterality Date  . COSMETIC SURGERY    . EP IMPLANTABLE DEVICE N/A 02/26/2016   Procedure: Pacemaker Implant;  Surgeon: Will Jorja Loa, MD;  Location: [facility] INVASIVE CV LAB;  Service: Cardiovascular;  Laterality: N/A;  . TUBAL LIGATION       OB History    Gravida  1   Para  1   Term      Preterm  1   AB      Living  1     SAB      TAB      Ectopic      Multiple      Live Births  1           Family History  Problem Relation Age of Onset  . Anxiety disorder Mother   . Hypertension Mother   .  Ovarian cancer Mother 24  . Cancer Maternal Aunt        breast  . Heart failure Maternal Grandfather     Social History   Tobacco Use  . Smoking status: Former Games developer  . Smokeless tobacco: Never Used  Substance Use Topics  . Alcohol use: No  . Drug use: No    Home Medications Prior to Admission medications   Medication Sig Start Date End Date Taking? Authorizing Provider  Ascorbic Acid (VITAMIN C) 1000 MG tablet Take 1,000 mg by mouth daily.   Yes [provider]  carvedilol (COREG) 25 MG tablet TAKE ONE TABLET BY MOUTH TWICE A DAY WITH MEALS Patient taking differently: Take 25 mg by mouth 2 (two) times daily with a meal.  02/29/20  Yes Camnitz, Will Daphine Deutscher, MD  cholecalciferol (VITAMIN D3) 25 MCG (1000 UT) tablet Take 1,000 Units by  mouth daily.   Yes [provider]  diazepam (VALIUM) 5 MG tablet Take 1 tablet (5 mg total) by mouth every 8 (eight) hours as needed for anxiety. Patient taking differently: Take 5 mg by mouth as needed for anxiety.  11/06/19  Yes Copland, Karleen Hampshire, MD  hydrochlorothiazide (HYDRODIURIL) 25 MG tablet TAKE ONE TABLET BY MOUTH DAILY Patient taking differently: Take 25 mg by mouth daily.  11/26/19  Yes Camnitz, Will Daphine Deutscher, MD  MAGNESIUM PO Take 1 tablet by mouth daily.   Yes [provider]  nortriptyline (PAMELOR) 10 MG capsule Take 10 mg by mouth at bedtime.  08/01/18  Yes [provider]  spironolactone (ALDACTONE) 25 MG tablet Take 1 tablet (25 mg total) by mouth daily. 01/08/20 07/06/20 Yes Copland, Karleen Hampshire, MD  Zinc 30 MG TABS Take 30 mg by mouth daily.   Yes [provider]    Allergies    Codeine, Losartan potassium, Neomycin-polymyxin-dexameth, Oxycodone-acetaminophen, Paroxetine, Sulfonamide derivatives, Lisinopril, and Sulfa antibiotics  Review of Systems   Review of Systems  Constitutional: Negative for chills and fever.  HENT: Negative for ear pain and sore throat.   Eyes: Negative for pain and visual disturbance.  Respiratory: Positive for shortness of breath. Negative for cough.   Cardiovascular: Positive for chest pain. Negative for palpitations.  Gastrointestinal: Negative for abdominal pain and vomiting.  Genitourinary: Negative for dysuria.  Musculoskeletal: Negative for arthralgias and myalgias.  Skin: Negative for color change and rash.  Neurological: Positive for headaches. Negative for syncope.  All other systems reviewed and are negative.   Physical Exam Updated Vital Signs BP 117/77   Pulse 91   Temp 98 F (36.7 C) (Oral)   Resp 16   Ht  (1.6 m)   Wt 93 kg   SpO2 93%   BMI 36.31 kg/m   Physical Exam Vitals and nursing note reviewed.  Constitutional:      General: She is not in acute distress.    Appearance: She  is well-developed.  HENT:     Head: Normocephalic and atraumatic.  Eyes:     Conjunctiva/sclera: Conjunctivae normal.  Cardiovascular:     Rate and Rhythm: Normal rate and regular rhythm.     Pulses: Normal pulses.  Pulmonary:     Effort: Pulmonary effort is normal. No respiratory distress.     Comments: 88% on RA, 96% on 2L [place] Coarse breath sounds bilaterally Abdominal:     Palpations: Abdomen is soft.     Tenderness: There is no abdominal tenderness.  Musculoskeletal:     Cervical back: Neck supple.  Skin:  General: Skin is warm and dry.  Neurological:     General: No focal deficit present.     Mental Status: She is alert and oriented to person, place, and time.  Psychiatric:        Mood and Affect: Mood normal.        Behavior: Behavior normal.     ED Results / Procedures / Treatments   Labs (all labs ordered are listed, but only abnormal results are displayed) Labs Reviewed  BASIC METABOLIC PANEL - Abnormal; Notable for the following components:      Result Value   Glucose, Bld 103 (*)    All other components within normal limits  CBC - Abnormal; Notable for the following components:   Hemoglobin 15.3 (*)    All other components within normal limits  D-DIMER, QUANTITATIVE (NOT AT [facility]) - Abnormal; Notable for the following components:   D-Dimer, Quant 1.12 (*)    All other components within normal limits  I-STAT BETA HCG BLOOD, ED ([facility], [facility], [facility] ONLY) - Abnormal; Notable for the following components:   I-stat hCG, quantitative 9.8 (*)    All other components within normal limits  SARS CORONAVIRUS 2 BY RT PCR (HOSPITAL ORDER, PERFORMED IN [facility] [facility])  HEPATIC FUNCTION PANEL  BRAIN NATRIURETIC PEPTIDE  PREGNANCY, URINE  TSH  HIV ANTIBODY (ROUTINE TESTING W REFLEX)  TROPONIN I (HIGH SENSITIVITY)  TROPONIN I (HIGH SENSITIVITY)    EKG EKG Interpretation  Date/Time:  Tuesday April 01 2020 14:41:40 EDT Ventricular Rate:  101 PR Interval:  166 QRS  Duration: 150 QT Interval:  386 QTC Calculation: 500 R Axis:   -73 Text Interpretation: Atrial-sensed ventricular-paced rhythm Abnormal ECG When compared with ECG of 02/27/2016, No significant change was found Confirmed by Dione Booze ([id number]) on 04/02/2020 1:59:37 AM   Radiology DG Chest 2 View  Result Date: 04/01/2020 CLINICAL DATA:  Chest pain, shortness of breath EXAM: CHEST - 2 VIEW COMPARISON:  07/04/2019 FINDINGS: Left-sided implanted cardiac device. The heart size and mediastinal contours are within normal limits. Chronically coarsened bilateral interstitial markings, similar in appearance to prior. No definite superimposed airspace opacity. No pleural effusion or pneumothorax. The visualized skeletal structures are unremarkable. IMPRESSION: Chronic interstitial lung changes. No definite superimposed acute cardiopulmonary process. Electronically Signed   By: Duanne Guess D.O.   On: 04/01/2020 15:24   CT Angio Chest PE W and/or Wo Contrast  Result Date: 04/02/2020 CLINICAL DATA:  59 year old female with shortness of breath and chest tightness for 8 days. EXAM: CT ANGIOGRAPHY CHEST WITH CONTRAST TECHNIQUE: Multidetector CT imaging of the chest was performed using the standard protocol during bolus administration of intravenous contrast. Multiplanar CT image reconstructions and MIPs were obtained to evaluate the vascular anatomy. CONTRAST:  36mL OMNIPAQUE IOHEXOL 350 MG/ML SOLN COMPARISON:  Chest radiographs yesterday. High-resolution chest CT 10/08/2010. FINDINGS: Cardiovascular: Good contrast bolus timing in the pulmonary arterial tree. No focal filling defect identified in the pulmonary arteries to suggest acute pulmonary embolism. Left chest cardiac pacemaker device. Cardiac size at the upper limits of normal. No pericardial effusion. Negative visible aorta aside from mild atherosclerosis. No calcified coronary artery atherosclerosis is evident. Mediastinum/Nodes: Negative.  No  lymphadenopathy. Lungs/Pleura: Major airways are patent. There is generalized pulmonary ground-glass opacity with bilateral increased subpleural opacity and upper lobe architectural distortion perhaps due to honeycombing (series 6, image 34). Very early similar changes were present on a 2012 chest CT where UIP was favored. Peripheral and dependent patchy opacity at both  lung bases. No pleural effusion. No consolidation. Upper Abdomen: Small chronic rim calcified nodule in the gastrohepatic ligament is most compatible with the chronic postinflammatory lymph node, mildly increased in conspicuity since 2012. Otherwise negative visible upper abdomen including the visible liver, gallbladder, spleen, adrenal glands. Musculoskeletal: No acute osseous abnormality identified. Incomplete segmentation of T2-T3. Review of the MIP images confirms the above findings. IMPRESSION: 1. Negative for acute pulmonary embolus. 2. Chronic interstitial lung disease with substantial progression since a 2012 high-resolution CT. Superimposed acute respiratory infection would be difficult to exclude but is not favored. Electronically Signed   By: Odessa Fleming M.D.   On: 04/02/2020 02:10    Procedures Procedures (including critical care time)  Medications Ordered in ED Medications  carvedilol (COREG) tablet 25 mg (has no administration in time range)  LORazepam (ATIVAN) injection 1 mg (has no administration in time range)  hydrochlorothiazide (HYDRODIURIL) tablet 25 mg (25 mg Oral Given 04/02/20 0851)  spironolactone (ALDACTONE) tablet 25 mg (25 mg Oral Given 04/02/20 0851)  nortriptyline (PAMELOR) capsule 10 mg (has no administration in time range)  diazepam (VALIUM) tablet 5 mg (has no administration in time range)  enoxaparin (LOVENOX) injection 40 mg (has no administration in time range)  sodium chloride flush (NS) 0.9 % injection 3 mL (has no administration in time range)  acetaminophen (TYLENOL) tablet 650 mg (has no  administration in time range)    Or  acetaminophen (TYLENOL) suppository 650 mg (has no administration in time range)  albuterol (PROVENTIL) (2.5 MG/3ML) 0.083% nebulizer solution 2.5 mg (has no administration in time range)  guaiFENesin (MUCINEX) 12 hr tablet 600 mg (has no administration in time range)  iohexol (OMNIPAQUE) 350 MG/ML injection 75 mL (75 mLs Intravenous Contrast Given 04/02/20 0148)    ED Course  I have reviewed the triage vital signs and the nursing notes.  Pertinent labs & imaging results that were available during my care of the patient were reviewed by me and considered in my medical decision making (see chart for details).  60 yo female with ILD presenting with worsening dyspnea for the past 2 weeks.  Also describes fatigue and headache.   COVID test is negative here.  She was hypoxic on arrival 88% requiring 2L [place], now stabilized and appearing comfortable.  She's on RA at home, 96% O2 generally.  She doesn't use home breathing treatments.  She has coarse breath sounds on exam.  A CT PE was ordered by triage or the overnight EDP based on elevated Ddimer and this showed no acute PE, but worsening ILD from her distant CT scan.  This is likely the cause of her hypoxia.  She will need hospitalization overnight for this, and may require discharge on home O2 and to establish care with local pulmonology (she cannot afford to see the [place] specialist anymore).  No leukocytosis or fever to suggest PNA at this time.  She also had CHF with EF 50-55% with last echo in 2018.  She may benefit from a repeat echocardiogram while in the hospital, if there is suspicion for CHF component.    I have a lower suspicion for PE and ACS with this workup.  Delta trops are flat.  I have a low suspicion for arrhythmia - she is in a paced rhythm on her ECG per my interpretation.  I personally reviewed her labs, images, and prior medical records including echocardiogram.   Clinical Course as  of Apr 03 1035  Wed Apr 02, 2020  0814  Signed out to Dr Katrinka Blazing hospitalist   [MT]    Clinical Course User Index [MT] Renaye Rakers Kermit Balo, MD    Final Clinical Impression(s) / ED Diagnoses Final diagnoses:  Hypoxia  Dyspnea, unspecified type    Rx / DC Orders ED Discharge Orders    None       Terald Sleeper, MD 04/02/20 1036

## 2020-04-03 ENCOUNTER — Inpatient Hospital Stay (HOSPITAL_COMMUNITY): Payer: BC Managed Care – PPO

## 2020-04-03 DIAGNOSIS — J849 Interstitial pulmonary disease, unspecified: Secondary | ICD-10-CM | POA: Diagnosis not present

## 2020-04-03 DIAGNOSIS — I5022 Chronic systolic (congestive) heart failure: Secondary | ICD-10-CM

## 2020-04-03 LAB — BASIC METABOLIC PANEL
Anion gap: 14 (ref 5–15)
BUN: 16 mg/dL (ref 6–20)
CO2: 25 mmol/L (ref 22–32)
Calcium: 9.6 mg/dL (ref 8.9–10.3)
Chloride: 99 mmol/L (ref 98–111)
Creatinine, Ser: 0.84 mg/dL (ref 0.44–1.00)
GFR calc Af Amer: >60 mL/min (ref >60)
GFR calc non Af Amer: >60 mL/min (ref >60)
Glucose, Bld: 171 mg/dL — ABNORMAL HIGH (ref 70–99)
Potassium: 3.9 mmol/L (ref 3.5–5.1)
Sodium: 138 mmol/L (ref 135–145)

## 2020-04-03 LAB — RESPIRATORY PANEL BY PCR

## 2020-04-03 LAB — RHEUMATOID FACTOR: Rheumatoid fact SerPl-aCnc: <10 IU/mL (ref 0.0–13.9)

## 2020-04-03 LAB — CBC
HCT: 44.1 % (ref 36.0–46.0)
Hemoglobin: 15.2 g/dL — ABNORMAL HIGH (ref 12.0–15.0)
MCH: 30.8 pg (ref 26.0–34.0)
MCHC: 34.5 g/dL (ref 30.0–36.0)
MCV: 89.3 fL (ref 80.0–100.0)
Platelets: 259 10*3/uL (ref 150–400)
RBC: 4.94 MIL/uL (ref 3.87–5.11)
RDW: 11.8 % (ref 11.5–15.5)
WBC: 6.8 10*3/uL (ref 4.0–10.5)
nRBC: 0 % (ref 0.0–0.2)

## 2020-04-03 LAB — MPO/PR-3 (ANCA) ANTIBODIES
ANCA Proteinase 3: <3.5 U/mL (ref 0.0–3.5)
Myeloperoxidase Abs: <9 U/mL (ref 0.0–9.0)

## 2020-04-03 LAB — STREP PNEUMONIAE URINARY ANTIGEN: Strep Pneumo Urinary Antigen: NEGATIVE

## 2020-04-03 LAB — ALDOLASE: Aldolase: 6.7 U/L (ref 3.3–10.3)

## 2020-04-03 LAB — ANA W/REFLEX IF POSITIVE: Anti Nuclear Antibody (ANA): NEGATIVE

## 2020-04-03 MED ORDER — METOPROLOL TARTRATE 5 MG/5ML IV SOLN
2.5000 mg | Freq: Four times a day (QID) | INTRAVENOUS | Status: DC | PRN
  Administered 2020-04-03: 2.5 mg via INTRAVENOUS
  Filled 2020-04-03: qty 5

## 2020-04-03 MED ORDER — LORAZEPAM 2 MG/ML IJ SOLN
0.5000 mg | Freq: Four times a day (QID) | INTRAMUSCULAR | Status: DC | PRN
  Administered 2020-04-04: 0.5 mg via INTRAVENOUS
  Filled 2020-04-03: qty 1

## 2020-04-03 MED ORDER — FUROSEMIDE 10 MG/ML IJ SOLN
40.0000 mg | Freq: Once | INTRAMUSCULAR | Status: AC
  Administered 2020-04-03: 40 mg via INTRAVENOUS
  Filled 2020-04-03: qty 4

## 2020-04-03 NOTE — Progress Notes (Signed)
[facility] PROGRESS NOTE   Anne Rush OAC:[id number] DOB: 1961/08/26 DOA: 04/01/2020  PCP: [name], MD  Brief History/Interval Summary: 59 y.o. female with medical history significant of hypertension, hyperlipidemia, anxiety, interstitial lung disease, complete heart block s/p PM who presented with complaints of shortness of breath over the last 9 days.  Walking up a flight of stairs in her home made her extremely short of breath to the point where she had to rest in order to catch her breath.  She noted associated symptoms with substernal chest tightness.  Patient had been checking her pulse oximetry at home and noted that O2 saturations would range from 85% to 92%.  Normally she was not requiring oxygen to maintain her O2 saturations at baseline.  She has been more sedentary and notes that she has been more fatigued.  She has approximately a 40 smoking pack year history, but quit last year.  Patient had previously been diagnosed with interstitial lung disease with respiratory bronchiolitis interstitial lung disease by Dr. Toma Deiters pulmonology at [facility].  She last saw him back in 2016.  At home she reports that she is not on any inhalers or treatments.  ED Course: Upon admission into the emergency department patient was seen to be afebrile, pulse 46-102, and O2 saturations as low as 89% improved on 2 L of nasal cannula oxygen.  Labs relatively unremarkable.  CT angiogram of the chest significant for progression of interstitial lung disease.  [facility] called to admit.  Reason for Visit: Acute respiratory failure with hypoxia  Consultants: Pulmonology.  Cardiology will be consulted  Procedures: None yet  Antibiotics: Anti-infectives (From admission, onward)   None      Subjective/Interval History: Patient states that she is feeling slightly better today compared to yesterday but still gets very short of breath with exertion.  Sometimes no shortness of breath while she is  lying down as well.  No specific history suggestive of PND.  Takes hydrochlorothiazide and spironolactone for lower extremity edema.  ROS: Denies any nausea vomiting.    Assessment/Plan:  Acute respiratory failure with hypoxia Likely due to idiopathic pulmonary fibrosis with exacerbation.  Patient also noted to have lower ejection fraction on her echocardiogram.  She does not have any significant lower extremity edema.  Unclear if systolic dysfunction is contributing to her symptoms or not.  Will consult cardiology.  Pulmonology is already following patient.  Currently on 2 L of oxygen.  Wean down as tolerated.  Exacerbation of idiopathic pulmonary fibrosis Pulmonology is following.  She has been started on steroids.  Normal.  New chronic systolic CHF/chest pain No significant edema noted.  BNP is normal.  There is some history suggestive of orthopnea but no history suggestive of PND.  Symptoms most likely due to her pulmonary issues but the little diminished EF and global hypokinesis noted on echocardiogram is definitely new.  We will give her a dose of IV Lasix.  Consult cardiology to assist with management.  EKG showed paced rhythm.  Troponins have been negative.  Patient is already on carvedilol.  Not noted to be on ACE inhibitor.  Allergy is listed to lisinopril apparently caused a rash.  There is also an allergy listed to ARB.  History of complete heart block status post pacemaker She is followed by Dr. Elberta Fortis.  Essential hypertension Monitor blood pressures closely.  History of anxiety disorder Uses Valium at home.  Not on any long-acting medication.  This was discussed with patient.  She will  discuss this further with her PCP.  She may benefit from being on a medication like Lexapro.  Obesity Estimated body mass index is 34.72 kg/m as calculated from the following:   Height as of this encounter:  (1.6 m).   Weight as of this encounter: 88.9 kg.  DVT Prophylaxis:  Lovenox Code Status: Full code Family Communication: Discussed with the patient and her husband Disposition Plan:  Status is: Inpatient  Remains inpatient appropriate because:IV treatments appropriate due to intensity of illness or inability to take PO and Inpatient level of care appropriate due to severity of illness   Dispo: The patient is from: Home              Anticipated d/c is to: Home              Anticipated d/c date is: 2 days              Patient currently is not medically stable to d/c.      Medications:  Scheduled: . carvedilol  25 mg Oral BID WC  . enoxaparin (LOVENOX) injection  40 mg Subcutaneous Q24H  . guaiFENesin  600 mg Oral BID  . hydrochlorothiazide  25 mg Oral Daily  . methylPREDNISolone (SOLU-MEDROL) injection  60 mg Intravenous Q12H  . nortriptyline  10 mg Oral QHS  . sodium chloride flush  3 mL Intravenous Q12H  . spironolactone  25 mg Oral Daily   Continuous:  WUJ:[id number] **OR** acetaminophen, albuterol, diazepam, LORazepam   Objective:  Vital Signs  Vitals:   04/02/20 1824 04/02/20 2349 04/03/20 0622 04/03/20 0752  BP: (!) 134/97 102/69 131/89 (!) 141/92  Pulse: (!) 102 99 (!) 110 (!) 105  Resp: Temp: 97.9 F (36.6 C) 98 F (36.7 C) (!) 97.5 F (36.4 C) (!) 97.4 F (36.3 C)  TempSrc: Oral Oral Oral   SpO2: 94% 94% 94% 95%  Weight:   88.9 kg   Height:        Intake/Output Summary (Last 24 hours) at 04/03/2020 1023 Last data filed at 04/03/2020 0629 Gross per 24 hour  Intake 480 ml  Output 1200 ml  Net -720 ml   Filed Weights   04/01/20 1441 04/03/20 0622  Weight: 93 kg 88.9 kg    General appearance: Awake alert.  In no distress Resp: Normal effort at rest.  Crackles heard bilateral bases.  No wheezing or rhonchi. Cardio: S1-S2 is normal regular.  No S3-S4.  No rubs murmurs or bruit GI: Abdomen is soft.  Nontender nondistended.  Bowel sounds are present normal.  No masses organomegaly Extremities: No  edema.  Full range of motion of lower extremities. Neurologic: Alert and oriented x3.  No focal neurological deficits.    Lab Results:  Data Reviewed: I have personally reviewed following labs and imaging studies  CBC: Recent Labs  Lab 04/01/20 1449 04/03/20 0518  WBC 6.6 6.8  HGB 15.3* 15.2*  HCT 43.8 44.1  MCV 90.1 89.3  PLT 277 259    Basic Metabolic Panel: Recent Labs  Lab 04/01/20 1449 04/03/20 0518  NA 138 138  K 3.8 3.9  CL 100 99  CO2 24 25  GLUCOSE 103* 171*  BUN 9 16  CREATININE 0.68 0.84  CALCIUM 9.8 9.6    GFR: Estimated Creatinine Clearance: 78.2 mL/min (by C-G formula based on SCr of 0.84 mg/dL).  Liver Function Tests: Recent Labs  Lab 04/01/20 1653  AST 26  ALT 19  ALKPHOS  54  BILITOT 1.0  PROT 7.8  ALBUMIN 4.2    Thyroid Function Tests: Recent Labs    04/02/20 1426  TSH 2.676     Recent Results (from the past 240 hour(s))  SARS Coronavirus 2 by RT PCR (hospital order, performed in [facility] hospital lab) Nasopharyngeal Nasopharyngeal Swab     Status: None   Collection Time: 04/02/20 12:40 AM   Specimen: Nasopharyngeal Swab  Result Value Ref Range Status   SARS Coronavirus 2 NEGATIVE NEGATIVE Final    Comment: (NOTE) SARS-CoV-2 target nucleic acids are NOT DETECTED.  The SARS-CoV-2 RNA is generally detectable in upper and lower respiratory specimens during the acute phase of infection. The lowest concentration of SARS-CoV-2 viral copies this assay can detect is 250 copies / mL. A negative result does not preclude SARS-CoV-2 infection and should not be used as the sole basis for treatment or other patient management decisions.  A negative result may occur with improper specimen collection / handling, submission of specimen other than nasopharyngeal swab, presence of viral mutation(s) within the areas targeted by this assay, and inadequate number of viral copies (<250 copies / mL). A negative result must be combined with  clinical observations, patient history, and epidemiological information.  Fact Sheet for Patients:   BoilerBrush.com.cy  Fact Sheet for Healthcare Providers: https://pope.com/  This test is not yet approved or  cleared by the [place] FDA and has been authorized for detection and/or diagnosis of SARS-CoV-2 by FDA under an Emergency Use Authorization (EUA).  This EUA will remain in effect (meaning this test can be used) for the duration of the COVID-19 declaration under Section 564(b)(1) of the Act, 21 U.S.C. section 360bbb-3(b)(1), unless the authorization is terminated or revoked sooner.  Performed at [facility], [address] [place], [place] [address]       Radiology Studies: DG Chest 2 View  Result Date: 04/01/2020 CLINICAL DATA:  Chest pain, shortness of breath EXAM: CHEST - 2 VIEW COMPARISON:  07/04/2019 FINDINGS: Left-sided implanted cardiac device. The heart size and mediastinal contours are within normal limits. Chronically coarsened bilateral interstitial markings, similar in appearance to prior. No definite superimposed airspace opacity. No pleural effusion or pneumothorax. The visualized skeletal structures are unremarkable. IMPRESSION: Chronic interstitial lung changes. No definite superimposed acute cardiopulmonary process. Electronically Signed   By: Duanne Guess D.O.   On: 04/01/2020 15:24   CT Angio Chest PE W and/or Wo Contrast  Result Date: 04/02/2020 CLINICAL DATA:  59 year old female with shortness of breath and chest tightness for 8 days. EXAM: CT ANGIOGRAPHY CHEST WITH CONTRAST TECHNIQUE: Multidetector CT imaging of the chest was performed using the standard protocol during bolus administration of intravenous contrast. Multiplanar CT image reconstructions and MIPs were obtained to evaluate the vascular anatomy. CONTRAST:  75mL OMNIPAQUE IOHEXOL 350 MG/ML SOLN COMPARISON:  Chest radiographs  yesterday. High-resolution chest CT 10/08/2010. FINDINGS: Cardiovascular: Good contrast bolus timing in the pulmonary arterial tree. No focal filling defect identified in the pulmonary arteries to suggest acute pulmonary embolism. Left chest cardiac pacemaker device. Cardiac size at the upper limits of normal. No pericardial effusion. Negative visible aorta aside from mild atherosclerosis. No calcified coronary artery atherosclerosis is evident. Mediastinum/Nodes: Negative.  No lymphadenopathy. Lungs/Pleura: Major airways are patent. There is generalized pulmonary ground-glass opacity with bilateral increased subpleural opacity and upper lobe architectural distortion perhaps due to honeycombing (series 6, image 34). Very early similar changes were present on a 2012 chest CT where UIP was favored.  Peripheral and dependent patchy opacity at both lung bases. No pleural effusion. No consolidation. Upper Abdomen: Small chronic rim calcified nodule in the gastrohepatic ligament is most compatible with the chronic postinflammatory lymph node, mildly increased in conspicuity since 2012. Otherwise negative visible upper abdomen including the visible liver, gallbladder, spleen, adrenal glands. Musculoskeletal: No acute osseous abnormality identified. Incomplete segmentation of T2-T3. Review of the MIP images confirms the above findings. IMPRESSION: 1. Negative for acute pulmonary embolus. 2. Chronic interstitial lung disease with substantial progression since a 2012 high-resolution CT. Superimposed acute respiratory infection would be difficult to exclude but is not favored. Electronically Signed   By: Odessa Fleming M.D.   On: 04/02/2020 02:10   ECHOCARDIOGRAM COMPLETE  Result Date: 04/02/2020    ECHOCARDIOGRAM REPORT   Patient Name:   Anne Rush Date of Exam: 04/02/2020 Medical Rec #:  [id number]     Height:       63.0 in Accession #:    [id number]    Weight:       205.0 lb Date of Birth:  Feb 17, 1961     BSA:          1.954 m  Patient Age:    59 years      BP:           117/83 mmHg Patient Gender: F             HR:           93 bpm. Exam Location:  Inpatient Procedure: 2D Echo, Cardiac Doppler and Color Doppler Indications:    Dyspnea 786.09 / R06.00  History:        Patient has prior history of Echocardiogram examinations, most                 recent 03/29/2017. Pacemaker; Risk Factors:Hypertension and                 Dyslipidemia. Cancer.  Sonographer:    Tiffany Dance Referring Phys: [id number] RONDELL A SMITH IMPRESSIONS  1. Septal-lateral dyskinesis. Left ventricular ejection fraction, by estimation, is 45 to 50%. The left ventricle has mildly decreased function. The left ventricle demonstrates global hypokinesis. There is mild concentric left ventricular hypertrophy. Left ventricular diastolic parameters are consistent with Grade I diastolic dysfunction (impaired relaxation).  2. Right ventricular systolic function is normal. The right ventricular size is normal.  3. The mitral valve is normal in structure. No evidence of mitral valve regurgitation. No evidence of mitral stenosis.  4. The aortic valve is tricuspid. Aortic valve regurgitation is not visualized. No aortic stenosis is present.  5. The inferior vena cava is normal in size with greater than 50% respiratory variability, suggesting right atrial pressure of 3 mmHg. FINDINGS  Left Ventricle: Septal-lateral dyskinesis. Left ventricular ejection fraction, by estimation, is 45 to 50%. The left ventricle has mildly decreased function. The left ventricle demonstrates global hypokinesis. The left ventricular internal cavity size was normal in size. There is mild concentric left ventricular hypertrophy. Left ventricular diastolic parameters are consistent with Grade I diastolic dysfunction (impaired relaxation). Indeterminate filling pressures. Right Ventricle: The right ventricular size is normal. No increase in right ventricular wall thickness. Right ventricular systolic function is  normal. Left Atrium: Left atrial size was normal in size. Right Atrium: Right atrial size was normal in size. Pericardium: There is no evidence of pericardial effusion. Mitral Valve: The mitral valve is normal in structure. Normal mobility of the mitral valve leaflets. No evidence of mitral valve  regurgitation. No evidence of mitral valve stenosis. Tricuspid Valve: The tricuspid valve is normal in structure. Tricuspid valve regurgitation is trivial. No evidence of tricuspid stenosis. Aortic Valve: The aortic valve is tricuspid. Aortic valve regurgitation is not visualized. No aortic stenosis is present. Pulmonic Valve: The pulmonic valve was normal in structure. Pulmonic valve regurgitation is not visualized. No evidence of pulmonic stenosis. Aorta: The aortic root is normal in size and structure. Venous: The inferior vena cava is normal in size with greater than 50% respiratory variability, suggesting right atrial pressure of 3 mmHg. IAS/Shunts: No atrial level shunt detected by color flow Doppler. Additional Comments: A pacer wire is visualized.  LEFT VENTRICLE PLAX 2D LVIDd:         3.99 cm  Diastology LVIDs:         3.07 cm  LV e' lateral:   4.03 cm/s LV PW:         1.07 cm  LV E/e' lateral: 21.5 LV IVS:        1.15 cm  LV e' medial:    4.68 cm/s LVOT diam:     2.00 cm  LV E/e' medial:  18.5 LV SV:         47 LV SV Index:   24 LVOT Area:     3.14 cm  RIGHT VENTRICLE             IVC RV Basal diam:  2.64 cm     IVC diam: 1.98 cm RV S prime:     10.10 cm/s TAPSE (M-mode): 1.2 cm LEFT ATRIUM             Index       RIGHT ATRIUM           Index LA diam:        3.10 cm 1.59 cm/m  RA Area:     10.70 cm LA Vol (A2C):   43.2 ml 22.11 ml/m RA Volume:   21.50 ml  11.00 ml/m LA Vol (A4C):   36.4 ml 18.63 ml/m LA Biplane Vol: 39.9 ml 20.42 ml/m  AORTIC VALVE LVOT Vmax:   82.40 cm/s LVOT Vmean:  50.450 cm/s LVOT VTI:    0.150 m  AORTA Ao Root diam: 3.10 cm Ao Asc diam:  2.80 cm MITRAL VALVE MV Area (PHT): 5.13 cm     SHUNTS MV Decel Time: 148 msec    Systemic VTI:  0.15 m MV E velocity: 86.60 cm/s  Systemic Diam: 2.00 cm Chilton Si MD Electronically signed by Chilton Si MD Signature Date/Time: 04/02/2020/2:57:14 PM    Final        LOS: 1 day   Osvaldo Shipper  [facility] Pager on [email or web address]  04/03/2020, 10:23 AM

## 2020-04-03 NOTE — Consult Note (Signed)
NAME:  Anne Rush, MRN:  [id number], DOB:  [birth year], LOS: 1 ADMISSION DATE:  04/01/2020, CONSULTATION DATE:  04/02/20 REFERRING MD:  Dr. Baldo Ash, CHIEF COMPLAINT: 04/02/20  Brief History    59 y/o F, former smoker, who presented to [facility] on 7/13 with worsening shortness of breath.   The patient reports her increase in shortness of breath began around the first of July.  She had noted her oxygen levels to be in the high 80's at home.  She reports progressively increased shortness of breath, dyspnea with exertion and difficulty walking a flight of stairs.  She has also noted a tightness in the middle of her chest.  She has not had a COVID vaccine.    The patient is a Clinical cytogeneticist.  She is a former smoker quit 2012 after diagnosis of ILD.  She was previously followed by Pulmonary at [facility] (Dr. Brock Bad, last seen in 2016) for ground glass opacities but has not seen them in several years due to financial constraints. She was supposed to have a lung biopsy. Her symptoms at the time were thought to be RBILD.  At that time she quit smoking and her FEV1 increased from 85 to >90 and infiltrates improved on CT.   ER evaluation included a CTA chest which was negative for PE but showed worsening ILD from her prior CT scan.  Troponin flat.    PCCM consulted for pulmonary evaluation.  Pets: dog in the house Allergies: none Occupation: Clinical cytogeneticist for [employer]: none Travel: none recent   No heavy dust exposures  No bird exposures   Results for DAPHANE, ODEKIRK (MRN [id number]) as of 04/03/2020 13:38  Ref. Range 10/12/2010 20:44 05/12/2015 09:18 05/12/2015 09:33 04/02/2020 18:57  Anti Nuclear Antibody (ANA) Latest Ref Range: NEGATIVE  NEG NEG    Cyclic Citrullin Peptide Ab Latest Ref Range: 0.0 - 5.0 U/mL  <2.0    ds DNA Ab Latest Units: IU/mL  1    RA Latex Turbid. Latest Ref Range: 0.0 - 13.9 IU/mL <10 IU/mL <10  <10.0  Tissue Transglut Ab Latest Ref Range: <6 U/mL  1    ENA SM Ab Ser-aCnc  Latest Ref Range: <1.0 NEG AI    <1.0 NEG   Results for MIKAIAH, STOFFER (MRN [id number]) as of 04/03/2020 13:38  Ref. Range 08/27/2010 11:32 10/12/2010 09:33 05/12/2015 09:01 04/02/2020 18:57  Sed Rate Latest Ref Range: 0 - 22 mm/hr 16 16 10 8   Results for MERIA, CRILLY (MRN [id number]) as of 04/03/2020 13:38  Ref. Range 04/02/2020 18:57  B Natriuretic Peptide Latest Ref Range: 0.0 - 100.0 pg/mL 21.9  LDH Latest Ref Range: 98 - 192 U/L 237 (H)  Aldolase Latest Ref Range: 3.3 - 10.3 U/L 6.7  CRP Latest Ref Range: <1.0 mg/dL 1.7 (H)  Procalcitonin Latest Units: ng/mL <0.10  Results for GERILYNN, MCCULLARS (MRN [id number]) as of 04/03/2020 13:38  Ref. Range 04/02/2020 14:26  HIV Screen 4th Generation wRfx Latest Ref Range: Non Reactive  Non Reactive   Past Medical History  ILD - not on home O2 Seasonal Allergies HTN  HLD  CHB s/p Pacemaker  Anxiety  Squamous Cell Carcinoma  Subclinical Hypothyroidism   Significant Hospital Events   7/13 Admit  Consults:    Procedures:    Significant Diagnostic Tests:  CTA Chest 7/14 >> negative for PE, chronic interstitial lung disease with substantial progression since 2012  Micro Data:  COVID 7/14 >> negative   Antimicrobials:  Interim history/subjective:    04/03/2020 - > she says in jan 2012 dx with ILD . Jan 2021 was CT. In may 2012 she quti smoking. Followed with Dr Anselmo Rod. Last visit with him and CT was in 2015. But stable till June 2021 and desaturating with exercise at home. Now needing oxygen. She is nervous about procedures incluidng bronch  Feels better after steroids  Objective   Blood pressure (!) 129/92, pulse (!) 106, temperature 97.7 F (36.5 C), temperature source Oral, resp. rate 18, height '5\' 3"'$  (1.6 m), weight 88.9 kg, SpO2 91 %.        Intake/Output Summary (Last 24 hours) at 04/03/2020 1335 Last data filed at 04/03/2020 0629 Gross per 24 hour  Intake 480 ml  Output 1200 ml  Net -720 ml   Filed Weights   04/01/20 1441  04/03/20 0622  Weight: 93 kg 88.9 kg    Examination: General Appearance:  Looks well. Obese Head:  Normocephalic, without obvious abnormality, atraumatic Eyes:  PERRL - yes, conjunctiva/corneas - clear     Ears:  Normal external ear canals, both ears Nose:  G tube - no but has [place] Throat:  ETT TUBE - no , OG tube - no Neck:  Supple,  No enlargement/tenderness/nodules Lungs: Clear to auscultation bilaterally, Heart:  S1 and S2 normal, no murmur, CVP - no.  Pressors - no Abdomen:  Soft, no masses, no organomegaly Genitalia / Rectal:  Not done Extremities:  Extremities- intact Skin:  ntact in exposed areas . Sacral area - not examined Neurologic:  Sedation - none -> RASS - +1 . Moves all 4s - yes. CAM-ICU - neg . Orientation - x3+    LABS    PULMONARY No results for input(s): PHART, PCO2ART, PO2ART, HCO3, TCO2, O2SAT in the last 168 hours.  Invalid input(s): PCO2, PO2  CBC Recent Labs  Lab 04/01/20 1449 04/03/20 0518  HGB 15.3* 15.2*  HCT 43.8 44.1  WBC 6.6 6.8  PLT 277 259    COAGULATION No results for input(s): INR in the last 168 hours.  CARDIAC  No results for input(s): TROPONINI in the last 168 hours. No results for input(s): PROBNP in the last 168 hours.   CHEMISTRY Recent Labs  Lab 04/01/20 1449 04/03/20 0518  NA 138 138  K 3.8 3.9  CL 100 99  CO2 24 25  GLUCOSE 103* 171*  BUN 9 16  CREATININE 0.68 0.84  CALCIUM 9.8 9.6   Estimated Creatinine Clearance: 78.2 mL/min (by C-G formula based on SCr of 0.84 mg/dL).   LIVER Recent Labs  Lab 04/01/20 1653  AST 26  ALT 19  ALKPHOS 54  BILITOT 1.0  PROT 7.8  ALBUMIN 4.2     INFECTIOUS Recent Labs  Lab 04/02/20 1857  PROCALCITON <0.10     ENDOCRINE CBG (last 3)  No results for input(s): GLUCAP in the last 72 hours.       IMAGING x48h  - image(s) personally visualized  -   highlighted in bold DG Chest 2 View  Result Date: 04/01/2020 CLINICAL DATA:  Chest pain, shortness of  breath EXAM: CHEST - 2 VIEW COMPARISON:  07/04/2019 FINDINGS: Left-sided implanted cardiac device. The heart size and mediastinal contours are within normal limits. Chronically coarsened bilateral interstitial markings, similar in appearance to prior. No definite superimposed airspace opacity. No pleural effusion or pneumothorax. The visualized skeletal structures are unremarkable. IMPRESSION: Chronic interstitial lung changes. No definite superimposed acute cardiopulmonary process. Electronically Signed   By: Hart Carwin  Plundo D.O.   On: 04/01/2020 15:24   CT Angio Chest PE W and/or Wo Contrast  Result Date: 04/02/2020 CLINICAL DATA:  59 year old female with shortness of breath and chest tightness for 8 days. EXAM: CT ANGIOGRAPHY CHEST WITH CONTRAST TECHNIQUE: Multidetector CT imaging of the chest was performed using the standard protocol during bolus administration of intravenous contrast. Multiplanar CT image reconstructions and MIPs were obtained to evaluate the vascular anatomy. CONTRAST:  51m OMNIPAQUE IOHEXOL 350 MG/ML SOLN COMPARISON:  Chest radiographs yesterday. High-resolution chest CT 10/08/2010. FINDINGS: Cardiovascular: Good contrast bolus timing in the pulmonary arterial tree. No focal filling defect identified in the pulmonary arteries to suggest acute pulmonary embolism. Left chest cardiac pacemaker device. Cardiac size at the upper limits of normal. No pericardial effusion. Negative visible aorta aside from mild atherosclerosis. No calcified coronary artery atherosclerosis is evident. Mediastinum/Nodes: Negative.  No lymphadenopathy. Lungs/Pleura: Major airways are patent. There is generalized pulmonary ground-glass opacity with bilateral increased subpleural opacity and upper lobe architectural distortion perhaps due to honeycombing (series 6, image 34). Very early similar changes were present on a 2012 chest CT where UIP was favored. Peripheral and dependent patchy opacity at both lung bases.  No pleural effusion. No consolidation. Upper Abdomen: Small chronic rim calcified nodule in the gastrohepatic ligament is most compatible with the chronic postinflammatory lymph node, mildly increased in conspicuity since 2012. Otherwise negative visible upper abdomen including the visible liver, gallbladder, spleen, adrenal glands. Musculoskeletal: No acute osseous abnormality identified. Incomplete segmentation of T2-T3. Review of the MIP images confirms the above findings. IMPRESSION: 1. Negative for acute pulmonary embolus. 2. Chronic interstitial lung disease with substantial progression since a 2012 high-resolution CT. Superimposed acute respiratory infection would be difficult to exclude but is not favored. Electronically Signed   By: HGenevie AnnM.D.   On: 04/02/2020 02:10   ECHOCARDIOGRAM COMPLETE  Result Date: 04/02/2020    ECHOCARDIOGRAM REPORT   Patient Name:   LAITANA BURRYDate of Exam: 04/02/2020 Medical Rec #:  [id number]    Height:       63.0 in Accession #:    [id number]   Weight:       205.0 lb Date of Birth:  3Jul 28, 1962    BSA:          1.954 m Patient Age:    89years      BP:           117/83 mmHg Patient Gender: F             HR:           93 bpm. Exam Location:  Inpatient Procedure: 2D Echo, Cardiac Doppler and Color Doppler Indications:    Dyspnea 786.09 / R06.00  History:        Patient has prior history of Echocardiogram examinations, most                 recent 03/29/2017. Pacemaker; Risk Factors:Hypertension and                 Dyslipidemia. Cancer.  Sonographer:    Tiffany Dance Referring Phys: [id number] A SMITH IMPRESSIONS  1. Septal-lateral dyskinesis. Left ventricular ejection fraction, by estimation, is 45 to 50%. The left ventricle has mildly decreased function. The left ventricle demonstrates global hypokinesis. There is mild concentric left ventricular hypertrophy. Left ventricular diastolic parameters are consistent with Grade I diastolic dysfunction (impaired relaxation).   2. Right ventricular systolic function is normal. The right  ventricular size is normal.  3. The mitral valve is normal in structure. No evidence of mitral valve regurgitation. No evidence of mitral stenosis.  4. The aortic valve is tricuspid. Aortic valve regurgitation is not visualized. No aortic stenosis is present.  5. The inferior vena cava is normal in size with greater than 50% respiratory variability, suggesting right atrial pressure of 3 mmHg. FINDINGS  Left Ventricle: Septal-lateral dyskinesis. Left ventricular ejection fraction, by estimation, is 45 to 50%. The left ventricle has mildly decreased function. The left ventricle demonstrates global hypokinesis. The left ventricular internal cavity size was normal in size. There is mild concentric left ventricular hypertrophy. Left ventricular diastolic parameters are consistent with Grade I diastolic dysfunction (impaired relaxation). Indeterminate filling pressures. Right Ventricle: The right ventricular size is normal. No increase in right ventricular wall thickness. Right ventricular systolic function is normal. Left Atrium: Left atrial size was normal in size. Right Atrium: Right atrial size was normal in size. Pericardium: There is no evidence of pericardial effusion. Mitral Valve: The mitral valve is normal in structure. Normal mobility of the mitral valve leaflets. No evidence of mitral valve regurgitation. No evidence of mitral valve stenosis. Tricuspid Valve: The tricuspid valve is normal in structure. Tricuspid valve regurgitation is trivial. No evidence of tricuspid stenosis. Aortic Valve: The aortic valve is tricuspid. Aortic valve regurgitation is not visualized. No aortic stenosis is present. Pulmonic Valve: The pulmonic valve was normal in structure. Pulmonic valve regurgitation is not visualized. No evidence of pulmonic stenosis. Aorta: The aortic root is normal in size and structure. Venous: The inferior vena cava is normal in size with  greater than 50% respiratory variability, suggesting right atrial pressure of 3 mmHg. IAS/Shunts: No atrial level shunt detected by color flow Doppler. Additional Comments: A pacer wire is visualized.  LEFT VENTRICLE PLAX 2D LVIDd:         3.99 cm  Diastology LVIDs:         3.07 cm  LV e' lateral:   4.03 cm/s LV PW:         1.07 cm  LV E/e' lateral: 21.5 LV IVS:        1.15 cm  LV e' medial:    4.68 cm/s LVOT diam:     2.00 cm  LV E/e' medial:  18.5 LV SV:         47 LV SV Index:   24 LVOT Area:     3.14 cm  RIGHT VENTRICLE             IVC RV Basal diam:  2.64 cm     IVC diam: 1.98 cm RV S prime:     10.10 cm/s TAPSE (M-mode): 1.2 cm LEFT ATRIUM             Index       RIGHT ATRIUM           Index LA diam:        3.10 cm 1.59 cm/m  RA Area:     10.70 cm LA Vol (A2C):   43.2 ml 22.11 ml/m RA Volume:   21.50 ml  11.00 ml/m LA Vol (A4C):   36.4 ml 18.63 ml/m LA Biplane Vol: 39.9 ml 20.42 ml/m  AORTIC VALVE LVOT Vmax:   82.40 cm/s LVOT Vmean:  50.450 cm/s LVOT VTI:    0.150 m  AORTA Ao Root diam: 3.10 cm Ao Asc diam:  2.80 cm MITRAL VALVE MV Area (PHT): 5.13 cm    SHUNTS  MV Decel Time: 148 msec    Systemic VTI:  0.15 m MV E velocity: 86.60 cm/s  Systemic Diam: 2.00 cm Skeet Latch MD Electronically signed by Skeet Latch MD Signature Date/Time: 04/02/2020/2:57:14 PM    Final       Resolved Hospital Problem list     Assessment & Plan:   Acute exacerbation of ILD  Previous smoker, initially felt to be RBILD in 2012  1:160 speckled ANA in 2012   - CT with areas of GGO - no infectious symptoms  - ESR normal -PCT normal   Plan: Continue solumedrol 29m BID Await labs for auto-immune workup Hold off on abx at this time.  Check  RVP, urine strep, urine leg Get HRCT supine and prone - she agrees Do ILD questionnarie  Regrlup again 04/04/20 = will need to manage her without invasive procedurs per her stated goal    Best practice:  Diet: Per primary  DVT prophylaxis: lovenox  GI  prophylaxis: n/a Glucose control: per primary  Mobility: as tolerated  Code Status: Full Code  Family Communication: daugther at bedside  Disposition: Per [facility]   SIGNATURE    Dr. MBrand Males M.D., F.C.C.P,  Pulmonary and Critical Care Medicine Staff Physician, CB[place] MissionDirector - Interstitial Lung Disease  Program  [place]ulmonary [place] [id number] Pager: [phone] If no answer or between  15:00h - 7:00h: call [phone] Telephone: [phone]  1:36 PM 04/03/2020

## 2020-04-03 NOTE — Consult Note (Signed)
Cardiology Consultation:   Patient ID: Audie Wieser Kail MRN: [id number]; DOB: [birth year]  Admit date: 04/01/2020 Date of Consult: 04/03/2020  Primary Care Provider: [name], MD [facility] Cardiologist: New to Dr. Flora Lipps  Electrophysiologist:  Will Jorja Loa, MD    Patient Profile:   Kamayah Pillay Omary is a 59 y.o. female with a hx of complete heart block s/p dual-chamber pacemaker, hypertension, hyperlipidemia, interstitial lung disease and obesity who is being seen today for the evaluation of CHF at the request of Dr. Rito Ehrlich.  Patient has history of nonischemic cardiomyopathy.  LV function was 35 to 40% in June 2017 which improved to 45 to 50% diagram September 2019 after dual-chamber pacemaker implantation.  LVEF of 50-55% in 03/2017.   Last seen by Dr. Elberta Fortis March 2021.  History of Present Illness:   Ms. Mcwhirt presented with worsening shortness of breath.   She used to go up and down of Home stairs without any problem however since last month she has noted difficulty with breathing and later substernal chest pressure.  No radiation, diaphoresis, nausea, or vomiting.  Last week she started noticing oxygen saturation in 85-92% intermittently with activity and then at rest.  Patient sleeps on 2 pillows chronically.  Denies orthopnea, PND, syncope, lower extremity edema or melena.  Intermittent fluttering sensation in her heart.  She is not on any treatment for lung disease.  Upon arrival to ER she was noted hypoxic in 80s and placed on nasal cannula with improved oxygenation.  CT angio of chest ruled out pulmonary embolism however showed progression of interstitial lung disease.  BNP normal.  CRP 1.7.  LDH 237.  Creatinine and electrolyte normal.  Echocardiogram showed low normal LV function at 45 to 50%, global hypokinesis and grade 1 diastolic dysfunction. No significant valvular abnormality.  Cardiology asked for further evaluation.   Past Medical History:  Diagnosis Date   . Anxiety   . Family history of ovarian cancer   . Hyperlipidemia   . Hypertension   . Interstitial lung disease (HCC)   . Pollen allergies   . Squamous cell carcinoma 2014   skin cancer  . Subclinical hyperthyroidism 06/17/2014    Past Surgical History:  Procedure Laterality Date  . COSMETIC SURGERY    . EP IMPLANTABLE DEVICE N/A 02/26/2016   Procedure: Pacemaker Implant;  Surgeon: Will Jorja Loa, MD;  Location: [facility] INVASIVE CV LAB;  Service: Cardiovascular;  Laterality: N/A;  . TUBAL LIGATION       Inpatient Medications: Scheduled Meds: . carvedilol  25 mg Oral BID WC  . enoxaparin (LOVENOX) injection  40 mg Subcutaneous Q24H  . furosemide  40 mg Intravenous Once  . guaiFENesin  600 mg Oral BID  . methylPREDNISolone (SOLU-MEDROL) injection  60 mg Intravenous Q12H  . nortriptyline  10 mg Oral QHS  . sodium chloride flush  3 mL Intravenous Q12H  . spironolactone  25 mg Oral Daily   Continuous Infusions:  PRN Meds: acetaminophen **OR** acetaminophen, albuterol, diazepam, LORazepam  Allergies:    Allergies  Allergen Reactions  . Codeine Hives and Itching  . Losartan Potassium Other (See Comments)  . Neomycin-Polymyxin-Dexameth Itching, Swelling and Other (See Comments)    Swelling and itching of eye   . Oxycodone-Acetaminophen Other (See Comments)    REACTION: unspecified  . Paroxetine Other (See Comments)    unknown  . Sulfonamide Derivatives Other (See Comments)    REACTION: unspecified  . Lisinopril Rash  . Sulfa Antibiotics Hives and Rash  Social History:   Social History   Socioeconomic History  . Marital status: Married    Spouse name: Not on file  . Number of children: 1  . Years of education: Not on file  . Highest education level: Not on file  Occupational History  . Occupation: house wife    Employer: [employer]  Tobacco Use  . Smoking status: Former Games developer  . Smokeless tobacco: Never Used  Substance and Sexual Activity  . Alcohol  use: No  . Drug use: No  . Sexual activity: Yes    Birth control/protection: Post-menopausal  Other Topics Concern  . Not on file  Social History Narrative   Regular exercise-no   Social Determinants of Health   Financial Resource Strain:   . Difficulty of Paying Living Expenses:   Food Insecurity:   . Worried About Programme researcher, broadcasting/film/video in the Last Year:   . Barista in the Last Year:   Transportation Needs:   . Freight forwarder (Medical):   [name] Lack of Transportation (Non-Medical):   Physical Activity:   . Days of Exercise per Week:   . Minutes of Exercise per Session:   Stress:   . Feeling of Stress :   Social Connections:   . Frequency of Communication with Friends and Family:   . Frequency of Social Gatherings with Friends and Family:   . Attends Religious Services:   . Active Member of Clubs or Organizations:   . Attends Banker Meetings:   [name] Marital Status:   Intimate Partner Violence:   . Fear of Current or Ex-Partner:   . Emotionally Abused:   [name] Physically Abused:   . Sexually Abused:     Family History:    Family History  Problem Relation Age of Onset  . Anxiety disorder Mother   . Hypertension Mother   . Ovarian cancer Mother 50  . Cancer Maternal Aunt        breast  . Heart failure Maternal Grandfather      ROS:  Please see the history of present illness.  All other ROS reviewed and negative.     Physical Exam/Data:   Vitals:   04/02/20 1824 04/02/20 2349 04/03/20 0622 04/03/20 0752  BP: (!) 134/97 102/69 131/89 (!) 141/92  Pulse: (!) 102 99 (!) 110 (!) 105  Resp: 16 18 18 18   Temp: 97.9 F (36.6 C) 98 F (36.7 C) (!) 97.5 F (36.4 C) (!) 97.4 F (36.3 C)  TempSrc: Oral Oral Oral   SpO2: 94% 94% 94% 95%  Weight:   88.9 kg   Height:        Intake/Output Summary (Last 24 hours) at 04/03/2020 1050 Last data filed at 04/03/2020 0629 Gross per 24 hour  Intake 480 ml  Output 1200 ml  Net -720 ml   Last 3 Weights  04/03/2020 04/01/2020 02/26/2020  Weight (lbs) 196 lb 205 lb 204 lb  Weight (kg) 88.905 kg 92.987 kg 92.534 kg     Body mass index is 34.72 kg/m.  General:  Well nourished, well developed, in no acute distress HEENT: normal Lymph: no adenopathy Neck: no JVD Endocrine:  No thryomegaly Vascular: No carotid bruits; FA pulses 2+ bilaterally without bruits  Cardiac:  normal S1, S2; RRR; no murmur  Lungs:  clear to auscultation bilaterally, no wheezing, rhonchi or rales  Abd: soft, nontender, no hepatomegaly  Ext: no edema Musculoskeletal:  No deformities, BUE and BLE strength normal and equal  Skin: warm and dry  Neuro:  CNs 2-12 intact, no focal abnormalities noted Psych:  Normal affect   EKG:  The EKG was personally reviewed and demonstrates:  V paced rhythm Telemetry:  Telemetry was personally reviewed and demonstrates: Paced rhythm at rate of 80s with gradual increase to 100s with intermittent tachycardia in 120 bpm  Relevant CV Studies:  Echo 04/03/2020 1. Septal-lateral dyskinesis. Left ventricular ejection fraction, by  estimation, is 45 to 50%. The left ventricle has mildly decreased  function. The left ventricle demonstrates global hypokinesis. There is  mild concentric left ventricular hypertrophy.  Left ventricular diastolic parameters are consistent with Grade I  diastolic dysfunction (impaired relaxation).  2. Right ventricular systolic function is normal. The right ventricular  size is normal.  3. The mitral valve is normal in structure. No evidence of mitral valve  regurgitation. No evidence of mitral stenosis.  4. The aortic valve is tricuspid. Aortic valve regurgitation is not  visualized. No aortic stenosis is present.  5. The inferior vena cava is normal in size with greater than 50%  respiratory variability, suggesting right atrial pressure of 3 mmHg.   Laboratory Data:  High Sensitivity Troponin:   Recent Labs  Lab 04/01/20 1449 04/01/20 1653   TROPONINIHS 5 4     Chemistry Recent Labs  Lab 04/01/20 1449 04/03/20 0518  NA 138 138  K 3.8 3.9  CL 100 99  CO2 24 25  GLUCOSE 103* 171*  BUN 9 16  CREATININE 0.68 0.84  CALCIUM 9.8 9.6  GFRNONAA >60 >60  GFRAA >60 >60  ANIONGAP 14 14    Recent Labs  Lab 04/01/20 1653  PROT 7.8  ALBUMIN 4.2  AST 26  ALT 19  ALKPHOS 54  BILITOT 1.0   Hematology Recent Labs  Lab 04/01/20 1449 04/03/20 0518  WBC 6.6 6.8  RBC 4.86 4.94  HGB 15.3* 15.2*  HCT 43.8 44.1  MCV 90.1 89.3  MCH 31.5 30.8  MCHC 34.9 34.5  RDW 12.0 11.8  PLT 277 259   BNP Recent Labs  Lab 04/02/20 1857  BNP 21.9    DDimer  Recent Labs  Lab 04/01/20 1653  DDIMER 1.12*     Radiology/Studies:  DG Chest 2 View  Result Date: 04/01/2020 CLINICAL DATA:  Chest pain, shortness of breath EXAM: CHEST - 2 VIEW COMPARISON:  07/04/2019 FINDINGS: Left-sided implanted cardiac device. The heart size and mediastinal contours are within normal limits. Chronically coarsened bilateral interstitial markings, similar in appearance to prior. No definite superimposed airspace opacity. No pleural effusion or pneumothorax. The visualized skeletal structures are unremarkable. IMPRESSION: Chronic interstitial lung changes. No definite superimposed acute cardiopulmonary process. Electronically Signed   By: Duanne Guess D.O.   On: 04/01/2020 15:24   CT Angio Chest PE W and/or Wo Contrast  Result Date: 04/02/2020 CLINICAL DATA:  59 year old female with shortness of breath and chest tightness for 8 days. EXAM: CT ANGIOGRAPHY CHEST WITH CONTRAST TECHNIQUE: Multidetector CT imaging of the chest was performed using the standard protocol during bolus administration of intravenous contrast. Multiplanar CT image reconstructions and MIPs were obtained to evaluate the vascular anatomy. CONTRAST:  73mL OMNIPAQUE IOHEXOL 350 MG/ML SOLN COMPARISON:  Chest radiographs yesterday. High-resolution chest CT 10/08/2010. FINDINGS:  Cardiovascular: Good contrast bolus timing in the pulmonary arterial tree. No focal filling defect identified in the pulmonary arteries to suggest acute pulmonary embolism. Left chest cardiac pacemaker device. Cardiac size at the upper limits of normal. No pericardial effusion. Negative visible aorta  aside from mild atherosclerosis. No calcified coronary artery atherosclerosis is evident. Mediastinum/Nodes: Negative.  No lymphadenopathy. Lungs/Pleura: Major airways are patent. There is generalized pulmonary ground-glass opacity with bilateral increased subpleural opacity and upper lobe architectural distortion perhaps due to honeycombing (series 6, image 34). Very early similar changes were present on a 2012 chest CT where UIP was favored. Peripheral and dependent patchy opacity at both lung bases. No pleural effusion. No consolidation. Upper Abdomen: Small chronic rim calcified nodule in the gastrohepatic ligament is most compatible with the chronic postinflammatory lymph node, mildly increased in conspicuity since 2012. Otherwise negative visible upper abdomen including the visible liver, gallbladder, spleen, adrenal glands. Musculoskeletal: No acute osseous abnormality identified. Incomplete segmentation of T2-T3. Review of the MIP images confirms the above findings. IMPRESSION: 1. Negative for acute pulmonary embolus. 2. Chronic interstitial lung disease with substantial progression since a 2012 high-resolution CT. Superimposed acute respiratory infection would be difficult to exclude but is not favored. Electronically Signed   By: Odessa Fleming M.D.   On: 04/02/2020 02:10   ECHOCARDIOGRAM COMPLETE  Result Date: 04/02/2020    ECHOCARDIOGRAM REPORT   Patient Name:   MARYKATHERINE SHERWOOD Date of Exam: 04/02/2020 Medical Rec #:  [id number]     Height:       63.0 in Accession #:    [id number]    Weight:       205.0 lb Date of Birth:  1961-02-05     BSA:          1.954 m Patient Age:    59 years      BP:           117/83 mmHg  Patient Gender: F             HR:           93 bpm. Exam Location:  Inpatient Procedure: 2D Echo, Cardiac Doppler and Color Doppler Indications:    Dyspnea 786.09 / R06.00  History:        Patient has prior history of Echocardiogram examinations, most                 recent 03/29/2017. Pacemaker; Risk Factors:Hypertension and                 Dyslipidemia. Cancer.  Sonographer:    Tiffany Dance Referring Phys: [id number] RONDELL A SMITH IMPRESSIONS  1. Septal-lateral dyskinesis. Left ventricular ejection fraction, by estimation, is 45 to 50%. The left ventricle has mildly decreased function. The left ventricle demonstrates global hypokinesis. There is mild concentric left ventricular hypertrophy. Left ventricular diastolic parameters are consistent with Grade I diastolic dysfunction (impaired relaxation).  2. Right ventricular systolic function is normal. The right ventricular size is normal.  3. The mitral valve is normal in structure. No evidence of mitral valve regurgitation. No evidence of mitral stenosis.  4. The aortic valve is tricuspid. Aortic valve regurgitation is not visualized. No aortic stenosis is present.  5. The inferior vena cava is normal in size with greater than 50% respiratory variability, suggesting right atrial pressure of 3 mmHg. FINDINGS  Left Ventricle: Septal-lateral dyskinesis. Left ventricular ejection fraction, by estimation, is 45 to 50%. The left ventricle has mildly decreased function. The left ventricle demonstrates global hypokinesis. The left ventricular internal cavity size was normal in size. There is mild concentric left ventricular hypertrophy. Left ventricular diastolic parameters are consistent with Grade I diastolic dysfunction (impaired relaxation). Indeterminate filling pressures. Right Ventricle: The right ventricular size is normal. No  increase in right ventricular wall thickness. Right ventricular systolic function is normal. Left Atrium: Left atrial size was normal in  size. Right Atrium: Right atrial size was normal in size. Pericardium: There is no evidence of pericardial effusion. Mitral Valve: The mitral valve is normal in structure. Normal mobility of the mitral valve leaflets. No evidence of mitral valve regurgitation. No evidence of mitral valve stenosis. Tricuspid Valve: The tricuspid valve is normal in structure. Tricuspid valve regurgitation is trivial. No evidence of tricuspid stenosis. Aortic Valve: The aortic valve is tricuspid. Aortic valve regurgitation is not visualized. No aortic stenosis is present. Pulmonic Valve: The pulmonic valve was normal in structure. Pulmonic valve regurgitation is not visualized. No evidence of pulmonic stenosis. Aorta: The aortic root is normal in size and structure. Venous: The inferior vena cava is normal in size with greater than 50% respiratory variability, suggesting right atrial pressure of 3 mmHg. IAS/Shunts: No atrial level shunt detected by color flow Doppler. Additional Comments: A pacer wire is visualized.  LEFT VENTRICLE PLAX 2D LVIDd:         3.99 cm  Diastology LVIDs:         3.07 cm  LV e' lateral:   4.03 cm/s LV PW:         1.07 cm  LV E/e' lateral: 21.5 LV IVS:        1.15 cm  LV e' medial:    4.68 cm/s LVOT diam:     2.00 cm  LV E/e' medial:  18.5 LV SV:         47 LV SV Index:   24 LVOT Area:     3.14 cm  RIGHT VENTRICLE             IVC RV Basal diam:  2.64 cm     IVC diam: 1.98 cm RV S prime:     10.10 cm/s TAPSE (M-mode): 1.2 cm LEFT ATRIUM             Index       RIGHT ATRIUM           Index LA diam:        3.10 cm 1.59 cm/m  RA Area:     10.70 cm LA Vol (A2C):   43.2 ml 22.11 ml/m RA Volume:   21.50 ml  11.00 ml/m LA Vol (A4C):   36.4 ml 18.63 ml/m LA Biplane Vol: 39.9 ml 20.42 ml/m  AORTIC VALVE LVOT Vmax:   82.40 cm/s LVOT Vmean:  50.450 cm/s LVOT VTI:    0.150 m  AORTA Ao Root diam: 3.10 cm Ao Asc diam:  2.80 cm MITRAL VALVE MV Area (PHT): 5.13 cm    SHUNTS MV Decel Time: 148 msec    Systemic VTI:  0.15  m MV E velocity: 86.60 cm/s  Systemic Diam: 2.00 cm Chilton Si MD Electronically signed by Chilton Si MD Signature Date/Time: 04/02/2020/2:57:14 PM    Final      Assessment and Plan:   1. Nonischemic cardiomyopathy Patient with longstanding history of cardiomyopathy. LV function was 35 to 40% in June 2017 which improved to 45 to 50% diagram September 2019 after dual-chamber pacemaker implantation. LVEF of 50-55% in 03/2017.  - Echocardiogram this admission showed low normal LV function at 45 to 50%, global hypokinesis and grade 1 diastolic dysfunction. No significant valvular abnormality. -Small reduction in LV function is an acceptable range of reduction from reader.  Overall her EF seems stable.  BNP normal.  Her  chest tightness occurs after severe shortness of breath.  Her symptoms likely consistent with lung etiology rather than CHF.  No lower extremity edema.  No coronary calcifications on CT of the chest. -Continue home Coreg and spironolactone -Add home hydrochlorothiazide  2.  Hypertension -Blood pressure relatively stable -Resume home medication  3.  Complete heart block s/p dual-chamber pacemaker -Heart rate minimally up intermittently -Likely due to exacerbation of ILD  4.  Acute hypoxic respiratory failure secondary to ILD exacerbation -Per primary/pulmonary team  For questions or updates, please contact [facility] Please consult [email or web address] for contact info under    Lorelei Pont, PA  04/03/2020 10:50 AM

## 2020-04-03 NOTE — Progress Notes (Signed)
Pt off unit to CT scan. Dionne Bucy RN

## 2020-04-04 ENCOUNTER — Inpatient Hospital Stay (HOSPITAL_COMMUNITY): Payer: BC Managed Care – PPO

## 2020-04-04 DIAGNOSIS — J849 Interstitial pulmonary disease, unspecified: Secondary | ICD-10-CM | POA: Diagnosis not present

## 2020-04-04 LAB — CBC
HCT: 43.2 % (ref 36.0–46.0)
Hemoglobin: 15.1 g/dL — ABNORMAL HIGH (ref 12.0–15.0)
MCH: 31.7 pg (ref 26.0–34.0)
MCHC: 35 g/dL (ref 30.0–36.0)
MCV: 90.8 fL (ref 80.0–100.0)
Platelets: 257 10*3/uL (ref 150–400)
RBC: 4.76 MIL/uL (ref 3.87–5.11)
RDW: 11.9 % (ref 11.5–15.5)
WBC: 13.9 10*3/uL — ABNORMAL HIGH (ref 4.0–10.5)
nRBC: 0 % (ref 0.0–0.2)

## 2020-04-04 LAB — PULMONARY FUNCTION TEST
DL/VA % pred: 97 %
DL/VA: 4.18 ml/min/mmHg/L
DLCO cor % pred: 66 %
DLCO cor: 13.23 ml/min/mmHg
DLCO unc % pred: 71 %
DLCO unc: 14.04 ml/min/mmHg
FEF 25-75 Pre: 2.46 L/sec
FEF2575-%Pred-Pre: 101 %
FEV1-%Pred-Pre: 75 %
FEV1-Pre: 1.92 L
FEV1FVC-%Pred-Pre: 107 %
FEV6-%Pred-Pre: 71 %
FEV6-Pre: 2.25 L
FEV6FVC-%Pred-Pre: 103 %
FVC-%Pred-Pre: 69 %
FVC-Pre: 2.25 L
Pre FEV1/FVC ratio: 85 %
Pre FEV6/FVC Ratio: 100 %

## 2020-04-04 LAB — ANCA TITERS
Atypical P-ANCA titer: <1:20 {titer}
C-ANCA: <1:20 {titer}
P-ANCA: <1:20 {titer}

## 2020-04-04 LAB — BASIC METABOLIC PANEL
Anion gap: 15 (ref 5–15)
BUN: 21 mg/dL — ABNORMAL HIGH (ref 6–20)
CO2: 26 mmol/L (ref 22–32)
Calcium: 10 mg/dL (ref 8.9–10.3)
Chloride: 96 mmol/L — ABNORMAL LOW (ref 98–111)
Creatinine, Ser: 0.79 mg/dL (ref 0.44–1.00)
GFR calc Af Amer: >60 mL/min (ref >60)
GFR calc non Af Amer: >60 mL/min (ref >60)
Glucose, Bld: 155 mg/dL — ABNORMAL HIGH (ref 70–99)
Potassium: 3.9 mmol/L (ref 3.5–5.1)
Sodium: 137 mmol/L (ref 135–145)

## 2020-04-04 LAB — CYCLIC CITRUL PEPTIDE ANTIBODY, IGG/IGA: CCP Antibodies IgG/IgA: 4 units (ref 0–19)

## 2020-04-04 LAB — LEGIONELLA PNEUMOPHILA SEROGP 1 UR AG: L. pneumophila Serogp 1 Ur Ag: NEGATIVE

## 2020-04-04 MED ORDER — FAMOTIDINE 20 MG PO TABS
20.0000 mg | ORAL_TABLET | Freq: Every day | ORAL | Status: DC
  Filled 2020-04-04 (×2): qty 1

## 2020-04-04 MED ORDER — PREDNISONE 20 MG PO TABS
50.0000 mg | ORAL_TABLET | Freq: Every day | ORAL | Status: DC
  Administered 2020-04-05: 50 mg via ORAL
  Filled 2020-04-04: qty 2

## 2020-04-04 NOTE — Consult Note (Addendum)
NAME:  Anne Rush, MRN:  [id number], DOB:  [birth year], LOS: 2 ADMISSION DATE:  04/01/2020, CONSULTATION DATE:  04/02/20 REFERRING MD:  Dr. Drusilla Rush, CHIEF COMPLAINT: 04/02/20  Brief History    59 y/o F, former smoker, who presented to [facility] on 7/13 with worsening shortness of breath.   The patient reports her increase in shortness of breath began around the first of July.  She had noted her oxygen levels to be in the high 80's at home.  She reports progressively increased shortness of breath, dyspnea with exertion and difficulty walking a flight of stairs.  She has also noted a tightness in the middle of her chest.  She has not had a COVID vaccine.    The patient is a Conservator, museum/gallery.  She is a former smoker quit 2012 after diagnosis of ILD.  She was previously followed by Pulmonary at [facility] (Dr. Ramond Dial, last seen in 2016) for ground glass opacities but has not seen them in several years due to financial constraints. She was supposed to have a lung biopsy. Her symptoms at the time were thought to be RBILD.  At that time she quit smoking and her FEV1 increased from 85 to >90 and infiltrates improved on CT.   ER evaluation included a CTA chest which was negative for PE but showed worsening ILD from her prior CT scan.  Troponin flat.    PCCM consulted for pulmonary evaluation.  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 Range 10/12/2010 20:44 05/12/2015 09:18 05/12/2015 09:33 04/02/2020 18:57  Anti Nuclear Antibody (ANA) Latest Ref Range: NEGATIVE  NEG NEG    Cyclic Citrullin Peptide Ab Latest Ref Range: 0.0 - 5.0 U/mL  <2.0    ds DNA Ab Latest Units: IU/mL  1    RA Latex Turbid. Latest Ref Range: 0.0 - 13.9 IU/mL <10 IU/mL <10  <10.0  Tissue Transglut Ab Latest Ref Range: <6 U/mL  1    ENA SM Ab Ser-aCnc  Latest Ref Range: <1.0 NEG AI    <1.0 NEG   Results for Anne, Rush (MRN [id number]) as of 04/03/2020 13:38  Ref. Range 08/27/2010 11:32 10/12/2010 09:33 05/12/2015 09:01 04/02/2020 18:57  Sed Rate Latest Ref Range: 0 - 22 mm/hr 16 16 10 8   Results for Anne, Rush (MRN [id number]) as of 04/03/2020 13:38  Ref. Range 04/02/2020 18:57  B Natriuretic Peptide Latest Ref Range: 0.0 - 100.0 pg/mL 21.9  LDH Latest Ref Range: 98 - 192 U/L 237 (H)  Aldolase Latest Ref Range: 3.3 - 10.3 U/L 6.7  CRP Latest Ref Range: <1.0 mg/dL 1.7 (H)  Procalcitonin Latest Units: ng/mL <0.10  Results for Anne, Rush (MRN [id number]) as of 04/03/2020 13:38  Ref. Range 04/02/2020 14:26  HIV Screen 4th Generation wRfx Latest Ref Range: Non Reactive  Non Reactive   Past Medical History  ILD - not on home O2 Seasonal Allergies HTN  HLD  CHB s/p Pacemaker  Anxiety  Squamous Cell Carcinoma  Subclinical Hypothyroidism   Significant Hospital Events   7/13 Admit  7/15 -  she says in jan 2012 dx with ILD . Jan 2021 was CT. In may 2012 she quti smoking. Followed with Dr Antony Salmon. Last visit with him and CT was in 2015. But stable till June 2021  and desaturating with exercise at home. Now needing oxygen. She is nervous about procedures incluidng bronch. Feels better after steroids  Consults:    Procedures:    Significant Diagnostic Tests:  CTA Chest 7/14 >> negative for PE, chronic interstitial lung disease with substantial progression since 2012  Micro Data:  COVID 7/14 >> negative   Antimicrobials:    Interim history/subjective:    04/04/2020 - > feels better. 87% on RA at rest now.  Husband reports mild confusion overnight.  He is wondering if the lorazepam, steroids and oxygen are contributing to this.  This morning she not as happy as and cheerful as before.  Otherwise no new complaints.  Overall she feels better since admission and attributes to steroids.  We went over the ILD questionnaire and is  documented below.   [place] [facility] Comprehensive ILD Questionnaire  Symptoms: Recent symptoms started suddenly.  Since it started 1 month ago is getting worse.  There is episodic dyspnea.  Severity is listed below.  She also has a cough that she reports specifically started on Monday, March 31, 2020.  Since it started and with steroids it is better.  It is mild in intensity.  She does bring up some phlegm that is green.  A separate brown speckles.  There is no hemoptysis.  Cough does not get worse when she lies down.  Does not affect her voice.  Does not clear her throat.  No tickle.  All the symptoms started approximately 5 weeks before admission.  SYMPTOM SCALE - ILD 04/04/2020   O2 use RA - 87% in hospital  Shortness of Breath 0 -> 5 scale with 5 being worst (score 6 If unable to do)  At rest 2  Simple tasks - showers, clothes change, eating, shaving 2  Household (dishes, doing bed, laundry) 5  Shopping 5  Walking level at own pace 4  Walking up Stairs 5  Total (30-36) Dyspnea Score 23  How bad is your cough? x  How bad is your fatigue x  How bad is nausea x  How bad is vomiting?  x  How bad is diarrhea? x  How bad is anxiety? x  How bad is depression x       Past Medical History : Positive for heart failure.  Third-degree heart block she has a pacer.  This has been going on for several years.  Currently ejection fraction reported as slightly low normal but according to Dr. Rito Ehrlich and the hospitalist cardiology reviewed the echo again and feel the ejection fraction is normal.  Current BNP is normal.  She has obesity.  Otherwise negative past medical history.  No COPD or asthma.  No collagen vascular disease no scleroderma no lupus no polymyositis.  Autoimmune panel is all negative.  No stroke or thyroid disease.  No hepatitis.  No history of blood clot.   ROS: Positive for dyspnea.  Was stable dyspnea all the way from 2012 up until recently 5 weeks ago.  There is associated  with fatigue and arthralgia.  No dysphagia.  No acid reflux.  No vomiting no nausea.   FAMILY HISTORY of LUNG DISEASE: Positive for COPD in the diet and asthma in the past.  No history of pulmonary fibrosis.  No cystic fibrosis denies hypersensitive pneumonitis no autoimmune disease.   EXPOSURE HISTORY:  -She did smoke cigarettes between 1979 and 2012.  She quit smoking in 2012 when her interstitial lung disease was diagnosed.  She smoked around 10 cigarettes a  day.  No cigars.  No passive smoking.  No vaping.  No marijuana use.  No cocaine use.  No intravenous drug use.   HOME and HOBBY DETAILS : [place]  Is a single-family home is also 59 years of age.  In the house there is no dampness.  There is no mold or mildew in the shower curtain.  There is no mold or mildew anywhere in the house.  There is no humidifier in the house no CPAP use in the house.  No nebulizer machine.  No steam iron.  No Jacuzzi.  No misting [place] in the house.  No pet birds or parakeets.  No pet gerbils.  No feather pillows or blankets.  No music habits.  No gardening habits.  She does throw prone to the deer but there is no mold in the:   OCCUPATIONAL HISTORY (122 questions) : There is significant water damage or mold exposure at work.  She works for [employer] since 2016.  Prior to this she was not working any atmosphere that had dampness of mold.  She states the building is a very old building.  She says that there has been significant water damage and water leak.  Water has port in into the several rooms in the office.  She says there is mold in the hallways.  There is mold in the break room.  There is mold in the bosses office.  She is not sure but she thinks there is mold even in her cubicle.  Is been going on for several years but increased in the last few years.  She says on her desk every morning there are black spots but she does not  know what it is.  Her husband showed pictures of the office of the right leg significant dampness and rotting of the walls and floor.  She works as a Catering manager.  It is just her and her boss.  Rest of the questionnaire is negative.   PULMONARY TOXICITY HISTORY (27 items): Never been on urinary antibiotic Macrodantin.  Currently on prednisone.   RECENT LABS  Results for STACEY, SAGO (MRN [id number]) as of 04/04/2020 12:42  Ref. Range 04/02/2020 18:57  Anti Nuclear Antibody (ANA) Latest Ref Range: Negative  Negative  ANCA Proteinase 3 Latest Ref Range: 0.0 - 3.5 U/mL <3.5  CCP Antibodies IgG/IgA Latest Ref Range: 0 - 19 units 4  Myeloperoxidase Abs Latest Ref Range: 0.0 - 9.0 U/mL <9.0  RA Latex Turbid. Latest Ref Range: 0.0 - 13.9 IU/mL <10.0    CT Chest High Resolution  Result Date: 04/03/2020 CLINICAL DATA:  Inpatient. Evaluate interstitial lung disease. Dyspnea. EXAM: CT CHEST WITHOUT CONTRAST TECHNIQUE: Multidetector CT imaging of the chest was performed following the standard protocol without intravenous contrast. High resolution imaging of the lungs, as well as inspiratory and expiratory imaging, was performed. COMPARISON:  04/02/2020 chest CT angiogram.  10/08/2010 chest CT. FINDINGS: Cardiovascular: Normal heart size. No significant pericardial effusion/thickening. Two lead left subclavian pacemaker with lead tips in the right atrium and right ventricular apex. Mildly atherosclerotic nonaneurysmal thoracic aorta. Normal caliber pulmonary arteries. Mediastinum/Nodes: No discrete thyroid nodules. Unremarkable esophagus. No pathologically enlarged axillary, mediastinal or hilar lymph nodes, noting limited sensitivity for the detection of hilar adenopathy on this noncontrast study. Lungs/Pleura: No pneumothorax. No pleural effusion. Mild centrilobular and paraseptal emphysema in the upper lobes. No acute consolidative airspace disease or  lung masses. Right middle lobe solid 7 mm pulmonary nodule  along the minor fissure (series 7/image 65), stable since 2012 CT, considered benign. No additional significant pulmonary nodules. Patchy confluent peripheral reticulation and peripheral lines throughout both lungs with associated mild traction bronchiectasis and architectural distortion, with regions of relative sparing of the immediate subpleural lungs. No clear apicobasilar gradient to these findings. No frank honeycombing (mild subpleural cystic changes in the upper lobes are favored represent paraseptal emphysema). These findings have progressed significantly since 10/08/2010 chest CT. Upper abdomen: No acute abnormality. Musculoskeletal: No aggressive appearing focal osseous lesions. Moderate thoracic spondylosis. IMPRESSION: 1. Spectrum of findings compatible with fibrotic interstitial lung disease without frank honeycombing or clear apicobasilar gradient. Findings have progressed since 2012 chest CT. The regions of immediate subpleural sparing are more suggestive of fibrotic phase nonspecific interstitial pneumonia (NSIP), although usual interstitial pneumonia (UIP) is on the differential particularly given progression. Findings are indeterminate for UIP per consensus guidelines: Diagnosis of Idiopathic Pulmonary Fibrosis: An Official ATS/ERS/JRS/ALAT Clinical Practice Guideline. Am Rosezetta Schlatter Crit Care Med Vol 198, Iss 5, [phone], May 21 2017. 2. Aortic Atherosclerosis (ICD10-I70.0) and Emphysema (ICD10-J43.9). Electronically Signed   By: Delbert Phenix M.D.   On: 04/03/2020 17:23       Objective   Blood pressure 108/71, pulse 98, temperature (!) 97.5 F (36.4 C), temperature source Oral, resp. rate 18, height 5\' 3"  (1.6 m), weight 89 kg, SpO2 94 %.        Intake/Output Summary (Last 24 hours) at 04/04/2020 1245 Last data filed at 04/04/2020 1037 Gross per 24 hour  Intake --  Output 2900 ml  Net -2900 ml   Filed Weights   04/01/20 1441 04/03/20 0622 04/04/20 0542  Weight: 93 kg 88.9 kg 89 kg     Examination: General Appearance:  Looks well. Obese Head:  Normocephalic, without obvious abnormality, atraumatic Eyes:  PERRL - yes, conjunctiva/corneas - muddy     Ears:  Normal external ear canals, both ears Nose:  G tube - no but has [place] o2 Throat:  ETT TUBE - no , OG tube - no Neck:  Supple,  No enlargement/tenderness/nodules Lungs: Clear to auscultation bilaterally, Heart:  S1 and S2 normal, no murmur, CVP - no.  Pressors - no Abdomen:  Soft, no masses, no organomegaly Genitalia / Rectal:  Not done Extremities:  Extremities- intact Skin:  ntact in exposed areas . Sacral area - not decbu Neurologic:  Sedation - none -> RASS - +1 . Moves all 4s - yes. CAM-ICU - neg . Orientation - x3+       LABS    PULMONARY No results for input(s): PHART, PCO2ART, PO2ART, HCO3, TCO2, O2SAT in the last 168 hours.  Invalid input(s): PCO2, PO2  CBC Recent Labs  Lab 04/01/20 1449 04/03/20 0518 04/04/20 0440  HGB 15.3* 15.2* 15.1*  HCT 43.8 44.1 43.2  WBC 6.6 6.8 13.9*  PLT 277 259 257    COAGULATION No results for input(s): INR in the last 168 hours.  CARDIAC  No results for input(s): TROPONINI in the last 168 hours. No results for input(s): PROBNP in the last 168 hours.   CHEMISTRY Recent Labs  Lab 04/01/20 1449 04/01/20 1449 04/03/20 0518 04/04/20 0440  NA 138  --  138 137  K 3.8   < > 3.9 3.9  CL 100  --  99 96*  CO2 24  --  25 26  GLUCOSE 103*  --  171* 155*  BUN 9  --  16 21*  CREATININE 0.68  --  0.84 0.79  CALCIUM 9.8  --  9.6 10.0   < > = values in this interval not displayed.   Estimated Creatinine Clearance: 82.1 mL/min (by C-G formula based on SCr of 0.79 mg/dL).   LIVER Recent Labs  Lab 04/01/20 1653  AST 26  ALT 19  ALKPHOS 54  BILITOT 1.0  PROT 7.8  ALBUMIN 4.2     INFECTIOUS Recent Labs  Lab 04/02/20 1857  PROCALCITON <0.10     ENDOCRINE CBG (last 3)  No results for input(s): GLUCAP in the last 72  hours.       IMAGING x48h  - image(s) personally visualized  -   highlighted in bold CT Chest High Resolution  Result Date: 04/03/2020 CLINICAL DATA:  Inpatient. Evaluate interstitial lung disease. Dyspnea. EXAM: CT CHEST WITHOUT CONTRAST TECHNIQUE: Multidetector CT imaging of the chest was performed following the standard protocol without intravenous contrast. High resolution imaging of the lungs, as well as inspiratory and expiratory imaging, was performed. COMPARISON:  04/02/2020 chest CT angiogram.  10/08/2010 chest CT. FINDINGS: Cardiovascular: Normal heart size. No significant pericardial effusion/thickening. Two lead left subclavian pacemaker with lead tips in the right atrium and right ventricular apex. Mildly atherosclerotic nonaneurysmal thoracic aorta. Normal caliber pulmonary arteries. Mediastinum/Nodes: No discrete thyroid nodules. Unremarkable esophagus. No pathologically enlarged axillary, mediastinal or hilar lymph nodes, noting limited sensitivity for the detection of hilar adenopathy on this noncontrast study. Lungs/Pleura: No pneumothorax. No pleural effusion. Mild centrilobular and paraseptal emphysema in the upper lobes. No acute consolidative airspace disease or lung masses. Right middle lobe solid 7 mm pulmonary nodule along the minor fissure (series 7/image 65), stable since 2012 CT, considered benign. No additional significant pulmonary nodules. Patchy confluent peripheral reticulation and peripheral lines throughout both lungs with associated mild traction bronchiectasis and architectural distortion, with regions of relative sparing of the immediate subpleural lungs. No clear apicobasilar gradient to these findings. No frank honeycombing (mild subpleural cystic changes in the upper lobes are favored represent paraseptal emphysema). These findings have progressed significantly since 10/08/2010 chest CT. Upper abdomen: No acute abnormality. Musculoskeletal: No aggressive appearing  focal osseous lesions. Moderate thoracic spondylosis. IMPRESSION: 1. Spectrum of findings compatible with fibrotic interstitial lung disease without frank honeycombing or clear apicobasilar gradient. Findings have progressed since 2012 chest CT. The regions of immediate subpleural sparing are more suggestive of fibrotic phase nonspecific interstitial pneumonia (NSIP), although usual interstitial pneumonia (UIP) is on the differential particularly given progression. Findings are indeterminate for UIP per consensus guidelines: Diagnosis of Idiopathic Pulmonary Fibrosis: An Official ATS/ERS/JRS/ALAT Clinical Practice Guideline. Am Rosezetta Schlatter Crit Care Med Vol 198, Iss 5, [phone], May 21 2017. 2. Aortic Atherosclerosis (ICD10-I70.0) and Emphysema (ICD10-J43.9). Electronically Signed   By: Delbert Phenix M.D.   On: 04/03/2020 17:23   ECHOCARDIOGRAM COMPLETE  Result Date: 04/02/2020    ECHOCARDIOGRAM REPORT   Patient Name:   RASHEEMA TRULUCK Date of Exam: 04/02/2020 Medical Rec #:  [id number]     Height:       63.0 in Accession #:    [id number]    Weight:       205.0 lb Date of Birth:  11-Nov-1960     BSA:          1.954 m Patient Age:    59 years      BP:           117/83 mmHg Patient Gender: F  HR:           93 bpm. Exam Location:  Inpatient Procedure: 2D Echo, Cardiac Doppler and Color Doppler Indications:    Dyspnea 786.09 / R06.00  History:        Patient has prior history of Echocardiogram examinations, most                 recent 03/29/2017. Pacemaker; Risk Factors:Hypertension and                 Dyslipidemia. Cancer.  Sonographer:    Tiffany Dance Referring Phys: [id number] RONDELL A SMITH IMPRESSIONS  1. Septal-lateral dyskinesis. Left ventricular ejection fraction, by estimation, is 45 to 50%. The left ventricle has mildly decreased function. The left ventricle demonstrates global hypokinesis. There is mild concentric left ventricular hypertrophy. Left ventricular diastolic parameters are consistent with  Grade I diastolic dysfunction (impaired relaxation).  2. Right ventricular systolic function is normal. The right ventricular size is normal.  3. The mitral valve is normal in structure. No evidence of mitral valve regurgitation. No evidence of mitral stenosis.  4. The aortic valve is tricuspid. Aortic valve regurgitation is not visualized. No aortic stenosis is present.  5. The inferior vena cava is normal in size with greater than 50% respiratory variability, suggesting right atrial pressure of 3 mmHg. FINDINGS  Left Ventricle: Septal-lateral dyskinesis. Left ventricular ejection fraction, by estimation, is 45 to 50%. The left ventricle has mildly decreased function. The left ventricle demonstrates global hypokinesis. The left ventricular internal cavity size was normal in size. There is mild concentric left ventricular hypertrophy. Left ventricular diastolic parameters are consistent with Grade I diastolic dysfunction (impaired relaxation). Indeterminate filling pressures. Right Ventricle: The right ventricular size is normal. No increase in right ventricular wall thickness. Right ventricular systolic function is normal. Left Atrium: Left atrial size was normal in size. Right Atrium: Right atrial size was normal in size. Pericardium: There is no evidence of pericardial effusion. Mitral Valve: The mitral valve is normal in structure. Normal mobility of the mitral valve leaflets. No evidence of mitral valve regurgitation. No evidence of mitral valve stenosis. Tricuspid Valve: The tricuspid valve is normal in structure. Tricuspid valve regurgitation is trivial. No evidence of tricuspid stenosis. Aortic Valve: The aortic valve is tricuspid. Aortic valve regurgitation is not visualized. No aortic stenosis is present. Pulmonic Valve: The pulmonic valve was normal in structure. Pulmonic valve regurgitation is not visualized. No evidence of pulmonic stenosis. Aorta: The aortic root is normal in size and structure.  Venous: The inferior vena cava is normal in size with greater than 50% respiratory variability, suggesting right atrial pressure of 3 mmHg. IAS/Shunts: No atrial level shunt detected by color flow Doppler. Additional Comments: A pacer wire is visualized.  LEFT VENTRICLE PLAX 2D LVIDd:         3.99 cm  Diastology LVIDs:         3.07 cm  LV e' lateral:   4.03 cm/s LV PW:         1.07 cm  LV E/e' lateral: 21.5 LV IVS:        1.15 cm  LV e' medial:    4.68 cm/s LVOT diam:     2.00 cm  LV E/e' medial:  18.5 LV SV:         47 LV SV Index:   24 LVOT Area:     3.14 cm  RIGHT VENTRICLE             IVC  RV Basal diam:  2.64 cm     IVC diam: 1.98 cm RV S prime:     10.10 cm/s TAPSE (M-mode): 1.2 cm LEFT ATRIUM             Index       RIGHT ATRIUM           Index LA diam:        3.10 cm 1.59 cm/m  RA Area:     10.70 cm LA Vol (A2C):   43.2 ml 22.11 ml/m RA Volume:   21.50 ml  11.00 ml/m LA Vol (A4C):   36.4 ml 18.63 ml/m LA Biplane Vol: 39.9 ml 20.42 ml/m  AORTIC VALVE LVOT Vmax:   82.40 cm/s LVOT Vmean:  50.450 cm/s LVOT VTI:    0.150 m  AORTA Ao Root diam: 3.10 cm Ao Asc diam:  2.80 cm MITRAL VALVE MV Area (PHT): 5.13 cm    SHUNTS MV Decel Time: 148 msec    Systemic VTI:  0.15 m MV E velocity: 86.60 cm/s  Systemic Diam: 2.00 cm Chilton Si MD Electronically signed by Chilton Si MD Signature Date/Time: 04/02/2020/2:57:14 PM    Final       Resolved Hospital Problem list     Assessment & Plan:   #Baseline: Interstitial lung disease -in 2012.  Previous smoker and quit and stable  #Current: Significant mold exposure x4 years especially worse in the last few years.  Interstitial lung disease flareup with new oxygen dependency this admission.  High-resolution CT chest alternative pattern to UIP but no clear description of air trapping  -Discussion: She might have had RB ILD in the past or NSIP based on the CT scan appearance.  She is doing stable after quitting smoking but in the last few years she has  had mold exposure and it has been a deterioration and presentation with symptoms suggestive of ILD flareup.  It appears that systolic heart failure is not under consideration especially with normal BNP.  Serologies are negative.  She does not want any biopsy or invasive procedures.  Clinically this is not UIP/IPF.  There is no evidence of autoimmune interstitial lung disease.  Baseline features overall could be NSIP or hypersensitive pneumonitis [although no air-trapping] or possibly smokers interstitial lung disease.  However the current flareup seems temporally related to mold exposure.  Is the only inciting antigen that we can find currently.  And therefore this could be a flareup and progression of previous NSIP or she has underlying hypersensitive pneumonitis.  Plan -Under no circumstances she should go back to her work atmosphere where there is mold exposure  -Needs prednisone for few to several weeks and may be 1 few months depending on the course [she seems to be improving although there is some mild confusion last night which could be multifactorial from benzodiazepines and steroids] -> okay to switch to p.o. prednisone April 05, 2020 at 50 mg/day x 2 weeks and then 40 mg/day x 2 weeks and then 30 mg/day x 2 weeks and then 20 mg/day x 2 weeks and then 10 mg/day to continue until further instructions  -Obtain hypersensitive pneumonitis blood panel while in the hospital and blood QuantiFERON gold  -Obtain spirometry and DLCO while in the hospital if possible  -Seems to still need oxygen at rest 2 L [today she is 87% on room air at rest although this is an improvement]  -Meet with Engineer, civil (consulting) for disability information which she is interested  - Prognosis:  Unclear prognosis.  It is possible that with steroids over few to several weeks or a few months that she improves and goes back to baseline.  It is also possible that she has only partial improvement.  There is also a small  chance she just deals with progressive ILD in which case antifibrotic's would be indicated as an outpatient.  Overall I am optimistic that she would have at least a partial improvement  -She would need to lose weight and get fit (given age below 84 -in the long run she might be a candidate for lung transplant if the need arose] -we will address this in the office  -Hospitalist to deal with encephalopathy if present.  At this point in time I have discontinued the lorazepam but left on her home diazepam   - can go home 04/05/20= after social work and o2 asessment  She and her husband updated    Best practice:  Detailed discussion with husband and patient  Follow-up as below.  Future Appointments  Date Time Provider Department Center  04/18/2020 12:00 PM Coral Ceo, NP [facility] None  04/30/2020  8:40 AM [name], MD [facility]  05/13/2020 11:00 AM O'Neal, Ronnald Ramp, MD [facility]  06/12/2020  7:35 AM [facility] DEVICE REMOTES [facility]    Dr. Marchelle Gearing will see her end of August 2021 or early September 2021 - for 30-minute slot preferably in [place]    > 50% of this 45-minute encounter in face-to-face counseling  SIGNATURE    Dr. Kalman Shan, M.D., F.C.C.P,  Pulmonary and Critical Care Medicine Staff Physician, [facility] Director - Interstitial Lung Disease  Program  Pulmonary Fibrosis [facility] at [facility] [place], [place], [address]  Pager: [phone], If no answer or between  15:00h - 7:00h: call [phone] Telephone: [phone]  12:45 PM 04/04/2020

## 2020-04-04 NOTE — Progress Notes (Signed)
PROGRESS NOTE    Anne Rush  OEV:[id number] DOB: 10-22-1960 DOA: 04/01/2020 PCP: [name], MD    Brief Narrative:  y.o.femalewith medical history significant ofhypertension, hyperlipidemia, anxiety, interstitial lung disease, complete heart block s/p PMwho presented with complaints of shortness of breath over the last 9 days. Walking up a flight of stairs in her home made her extremely short of breath to the point where she had to rest in order to catch her breath. She noted associated symptoms with substernal chest tightness. Patient had been checking her pulse oximetry at home and noted that O2 saturations would range from 85% to 92%. Normally she was not requiring oxygen to maintain her O2 saturations at baseline. She has been more sedentary and notes that she has been more fatigued. She has approximately a 40 smoking pack year history, but quit last year. Patient had previously been diagnosed with interstitial lung disease with respiratory bronchiolitis interstitial lung disease by Dr. Toma Deiters pulmonology at [facility].She last saw him back in 2016. At home she reports that she is not on any inhalers or treatments.  ED Course:Upon admission into the emergency department patient was seen to be afebrile, pulse 46-102,andO2 saturations as low as 89% improved on 2 L of nasal cannula oxygen.Labs relatively unremarkable. CT angiogram of the chest significant for progression of interstitial lung disease. TRH called to admit.     Consultants:   Cardiology, pulmonary  Procedures:   Antimicrobials:       Subjective: Minimally better.  She states with ambulation her O2 sat drops to 88% on 3 L.  At rest is 92%.  No chest pain or pressure.  Objective: Vitals:   04/03/20 1300 04/03/20 1724 04/03/20 2351 04/04/20 0542  BP: (!) 129/92 118/82 (!) 134/92 138/89  Pulse: (!) 106 (!) 102 98 96  Resp: 18 18 18 18   Temp: 97.7 F (36.5 C) 98.1 F (36.7 C) 97.6 F  (36.4 C) 98.6 F (37 C)  TempSrc: Oral Oral Oral Oral  SpO2: 91% 93% 95% 95%  Weight:    89 kg  Height:        Intake/Output Summary (Last 24 hours) at 04/04/2020 0844 Last data filed at 04/04/2020 0550 Gross per 24 hour  Intake --  Output 2600 ml  Net -2600 ml   Filed Weights   04/01/20 1441 04/03/20 0622 04/04/20 0542  Weight: 93 kg 88.9 kg 89 kg    Examination:  General exam: Appears calm and comfortable  Respiratory system: End expiratory fine Velcro crackles bilaterally Cardiovascular system: S1 & S2 heard, RRR. No JVD, murmurs, rubs, gallops or clicks Gastrointestinal system: Abdomen is nondistended, soft and nontender.  Normal bowel sounds heard. Central nervous system: Alert and oriented.  Grossly intact Extremities: No edema Skin: Warm dry Psychiatry: Judgement and insight appear normal. Mood & affect appropriate.     Data Reviewed: I have personally reviewed following labs and imaging studies  CBC: Recent Labs  Lab 04/01/20 1449 04/03/20 0518 04/04/20 0440  WBC 6.6 6.8 13.9*  HGB 15.3* 15.2* 15.1*  HCT 43.8 44.1 43.2  MCV 90.1 89.3 90.8  PLT 277 259 257   Basic Metabolic Panel: Recent Labs  Lab 04/01/20 1449 04/03/20 0518 04/04/20 0440  NA 138 138 137  K 3.8 3.9 3.9  CL 100 99 96*  CO2 24 25 26   GLUCOSE 103* 171* 155*  BUN 9 16 21*  CREATININE 0.68 0.84 0.79  CALCIUM 9.8 9.6 10.0   GFR: Estimated Creatinine Clearance: 82.1  mL/min (by C-G formula based on SCr of 0.79 mg/dL). Liver Function Tests: Recent Labs  Lab 04/01/20 1653  AST 26  ALT 19  ALKPHOS 54  BILITOT 1.0  PROT 7.8  ALBUMIN 4.2   No results for input(s): LIPASE, AMYLASE in the last 168 hours. No results for input(s): AMMONIA in the last 168 hours. Coagulation Profile: No results for input(s): INR, PROTIME in the last 168 hours. Cardiac Enzymes: No results for input(s): CKTOTAL, CKMB, CKMBINDEX, TROPONINI in the last 168 hours. BNP (last 3 results) No results for  input(s): PROBNP in the last 8760 hours. HbA1C: No results for input(s): HGBA1C in the last 72 hours. CBG: No results for input(s): GLUCAP in the last 168 hours. Lipid Profile: No results for input(s): CHOL, HDL, LDLCALC, TRIG, CHOLHDL, LDLDIRECT in the last 72 hours. Thyroid Function Tests: Recent Labs    04/02/20 1426  TSH 2.676   Anemia Panel: No results for input(s): VITAMINB12, FOLATE, FERRITIN, TIBC, IRON, RETICCTPCT in the last 72 hours. Sepsis Labs: Recent Labs  Lab 04/02/20 1857  PROCALCITON <0.10    Recent Results (from the past 240 hour(s))  SARS Coronavirus 2 by RT PCR (hospital order, performed in [facility] hospital lab) Nasopharyngeal Nasopharyngeal Swab     Status: None   Collection Time: 04/02/20 12:40 AM   Specimen: Nasopharyngeal Swab  Result Value Ref Range Status   SARS Coronavirus 2 NEGATIVE NEGATIVE Final    Comment: (NOTE) SARS-CoV-2 target nucleic acids are NOT DETECTED.  The SARS-CoV-2 RNA is generally detectable in upper and lower respiratory specimens during the acute phase of infection. The lowest concentration of SARS-CoV-2 viral copies this assay can detect is 250 copies / mL. A negative result does not preclude SARS-CoV-2 infection and should not be used as the sole basis for treatment or other patient management decisions.  A negative result may occur with improper specimen collection / handling, submission of specimen other than nasopharyngeal swab, presence of viral mutation(s) within the areas targeted by this assay, and inadequate number of viral copies (<250 copies / mL). A negative result must be combined with clinical observations, patient history, and epidemiological information.  Fact Sheet for Patients:   BoilerBrush.com.cy  Fact Sheet for Healthcare Providers: https://pope.com/  This test is not yet approved or  cleared by the [place] FDA and has been authorized for  detection and/or diagnosis of SARS-CoV-2 by FDA under an Emergency Use Authorization (EUA).  This EUA will remain in effect (meaning this test can be used) for the duration of the COVID-19 declaration under Section 564(b)(1) of the Act, 21 U.S.C. section 360bbb-3(b)(1), unless the authorization is terminated or revoked sooner.  Performed at [facility], [address], [place], [place] [address]   Respiratory Panel by PCR     Status: None   Collection Time: 04/03/20  2:48 PM   Specimen: Nasopharyngeal Swab; Respiratory  Result Value Ref Range Status   Adenovirus NOT DETECTED NOT DETECTED Final   Coronavirus 229E NOT DETECTED NOT DETECTED Final    Comment: (NOTE) The Coronavirus on the Respiratory Panel, DOES NOT test for the novel  Coronavirus (2019 nCoV)    Coronavirus HKU1 NOT DETECTED NOT DETECTED Final   Coronavirus NL63 NOT DETECTED NOT DETECTED Final   Coronavirus OC43 NOT DETECTED NOT DETECTED Final   Metapneumovirus NOT DETECTED NOT DETECTED Final   Rhinovirus / Enterovirus NOT DETECTED NOT DETECTED Final   Influenza A NOT DETECTED NOT DETECTED Final   Influenza B  NOT DETECTED NOT DETECTED Final   Parainfluenza Virus 1 NOT DETECTED NOT DETECTED Final   Parainfluenza Virus 2 NOT DETECTED NOT DETECTED Final   Parainfluenza Virus 3 NOT DETECTED NOT DETECTED Final   Parainfluenza Virus 4 NOT DETECTED NOT DETECTED Final   Respiratory Syncytial Virus NOT DETECTED NOT DETECTED Final   Bordetella pertussis NOT DETECTED NOT DETECTED Final   Chlamydophila pneumoniae NOT DETECTED NOT DETECTED Final   Mycoplasma pneumoniae NOT DETECTED NOT DETECTED Final    Comment: Performed at [facility], [address] [place], [place] [address]         Radiology Studies: CT Chest High Resolution  Result Date: 04/03/2020 CLINICAL DATA:  Inpatient. Evaluate interstitial lung disease. Dyspnea. EXAM: CT CHEST WITHOUT CONTRAST TECHNIQUE: Multidetector CT imaging of the chest  was performed following the standard protocol without intravenous contrast. High resolution imaging of the lungs, as well as inspiratory and expiratory imaging, was performed. COMPARISON:  04/02/2020 chest CT angiogram.  10/08/2010 chest CT. FINDINGS: Cardiovascular: Normal heart size. No significant pericardial effusion/thickening. Two lead left subclavian pacemaker with lead tips in the right atrium and right ventricular apex. Mildly atherosclerotic nonaneurysmal thoracic aorta. Normal caliber pulmonary arteries. Mediastinum/Nodes: No discrete thyroid nodules. Unremarkable esophagus. No pathologically enlarged axillary, mediastinal or hilar lymph nodes, noting limited sensitivity for the detection of hilar adenopathy on this noncontrast study. Lungs/Pleura: No pneumothorax. No pleural effusion. Mild centrilobular and paraseptal emphysema in the upper lobes. No acute consolidative airspace disease or lung masses. Right middle lobe solid 7 mm pulmonary nodule along the minor fissure (series 7/image 65), stable since 2012 CT, considered benign. No additional significant pulmonary nodules. Patchy confluent peripheral reticulation and peripheral lines throughout both lungs with associated mild traction bronchiectasis and architectural distortion, with regions of relative sparing of the immediate subpleural lungs. No clear apicobasilar gradient to these findings. No frank honeycombing (mild subpleural cystic changes in the upper lobes are favored represent paraseptal emphysema). These findings have progressed significantly since 10/08/2010 chest CT. Upper abdomen: No acute abnormality. Musculoskeletal: No aggressive appearing focal osseous lesions. Moderate thoracic spondylosis. IMPRESSION: 1. Spectrum of findings compatible with fibrotic interstitial lung disease without frank honeycombing or clear apicobasilar gradient. Findings have progressed since 2012 chest CT. The regions of immediate subpleural sparing are more  suggestive of fibrotic phase nonspecific interstitial pneumonia (NSIP), although usual interstitial pneumonia (UIP) is on the differential particularly given progression. Findings are indeterminate for UIP per consensus guidelines: Diagnosis of Idiopathic Pulmonary Fibrosis: An Official ATS/ERS/JRS/ALAT Clinical Practice Guideline. Am Rosezetta Schlatter Crit Care Med Vol 198, Iss 5, [phone], May 21 2017. 2. Aortic Atherosclerosis (ICD10-I70.0) and Emphysema (ICD10-J43.9). Electronically Signed   By: Delbert Phenix M.D.   On: 04/03/2020 17:23   ECHOCARDIOGRAM COMPLETE  Result Date: 04/02/2020    ECHOCARDIOGRAM REPORT   Patient Name:   BIANCE MONCRIEF Date of Exam: 04/02/2020 Medical Rec #:  [id number]     Height:       63.0 in Accession #:    [id number]    Weight:       205.0 lb Date of Birth:  05/24/1961     BSA:          1.954 m Patient Age:    59 years      BP:           117/83 mmHg Patient Gender: F             HR:  93 bpm. Exam Location:  Inpatient Procedure: 2D Echo, Cardiac Doppler and Color Doppler Indications:    Dyspnea 786.09 / R06.00  History:        Patient has prior history of Echocardiogram examinations, most                 recent 03/29/2017. Pacemaker; Risk Factors:Hypertension and                 Dyslipidemia. Cancer.  Sonographer:    Tiffany Dance Referring Phys: [id number] RONDELL A SMITH IMPRESSIONS  1. Septal-lateral dyskinesis. Left ventricular ejection fraction, by estimation, is 45 to 50%. The left ventricle has mildly decreased function. The left ventricle demonstrates global hypokinesis. There is mild concentric left ventricular hypertrophy. Left ventricular diastolic parameters are consistent with Grade I diastolic dysfunction (impaired relaxation).  2. Right ventricular systolic function is normal. The right ventricular size is normal.  3. The mitral valve is normal in structure. No evidence of mitral valve regurgitation. No evidence of mitral stenosis.  4. The aortic valve is tricuspid. Aortic  valve regurgitation is not visualized. No aortic stenosis is present.  5. The inferior vena cava is normal in size with greater than 50% respiratory variability, suggesting right atrial pressure of 3 mmHg. FINDINGS  Left Ventricle: Septal-lateral dyskinesis. Left ventricular ejection fraction, by estimation, is 45 to 50%. The left ventricle has mildly decreased function. The left ventricle demonstrates global hypokinesis. The left ventricular internal cavity size was normal in size. There is mild concentric left ventricular hypertrophy. Left ventricular diastolic parameters are consistent with Grade I diastolic dysfunction (impaired relaxation). Indeterminate filling pressures. Right Ventricle: The right ventricular size is normal. No increase in right ventricular wall thickness. Right ventricular systolic function is normal. Left Atrium: Left atrial size was normal in size. Right Atrium: Right atrial size was normal in size. Pericardium: There is no evidence of pericardial effusion. Mitral Valve: The mitral valve is normal in structure. Normal mobility of the mitral valve leaflets. No evidence of mitral valve regurgitation. No evidence of mitral valve stenosis. Tricuspid Valve: The tricuspid valve is normal in structure. Tricuspid valve regurgitation is trivial. No evidence of tricuspid stenosis. Aortic Valve: The aortic valve is tricuspid. Aortic valve regurgitation is not visualized. No aortic stenosis is present. Pulmonic Valve: The pulmonic valve was normal in structure. Pulmonic valve regurgitation is not visualized. No evidence of pulmonic stenosis. Aorta: The aortic root is normal in size and structure. Venous: The inferior vena cava is normal in size with greater than 50% respiratory variability, suggesting right atrial pressure of 3 mmHg. IAS/Shunts: No atrial level shunt detected by color flow Doppler. Additional Comments: A pacer wire is visualized.  LEFT VENTRICLE PLAX 2D LVIDd:         3.99 cm   Diastology LVIDs:         3.07 cm  LV e' lateral:   4.03 cm/s LV PW:         1.07 cm  LV E/e' lateral: 21.5 LV IVS:        1.15 cm  LV e' medial:    4.68 cm/s LVOT diam:     2.00 cm  LV E/e' medial:  18.5 LV SV:         47 LV SV Index:   24 LVOT Area:     3.14 cm  RIGHT VENTRICLE             IVC RV Basal diam:  2.64 cm     IVC  diam: 1.98 cm RV S prime:     10.10 cm/s TAPSE (M-mode): 1.2 cm LEFT ATRIUM             Index       RIGHT ATRIUM           Index LA diam:        3.10 cm 1.59 cm/m  RA Area:     10.70 cm LA Vol (A2C):   43.2 ml 22.11 ml/m RA Volume:   21.50 ml  11.00 ml/m LA Vol (A4C):   36.4 ml 18.63 ml/m LA Biplane Vol: 39.9 ml 20.42 ml/m  AORTIC VALVE LVOT Vmax:   82.40 cm/s LVOT Vmean:  50.450 cm/s LVOT VTI:    0.150 m  AORTA Ao Root diam: 3.10 cm Ao Asc diam:  2.80 cm MITRAL VALVE MV Area (PHT): 5.13 cm    SHUNTS MV Decel Time: 148 msec    Systemic VTI:  0.15 m MV E velocity: 86.60 cm/s  Systemic Diam: 2.00 cm Chilton Si MD Electronically signed by Chilton Si MD Signature Date/Time: 04/02/2020/2:57:14 PM    Final         Scheduled Meds: . carvedilol  25 mg Oral BID WC  . enoxaparin (LOVENOX) injection  40 mg Subcutaneous Q24H  . guaiFENesin  600 mg Oral BID  . methylPREDNISolone (SOLU-MEDROL) injection  60 mg Intravenous Q12H  . nortriptyline  10 mg Oral QHS  . sodium chloride flush  3 mL Intravenous Q12H  . spironolactone  25 mg Oral Daily   Continuous Infusions:  Assessment & Plan:   Principal Problem:   Acute respiratory failure with hypoxia (HCC) Active Problems:   Essential hypertension   Complete heart block (HCC)   Pacemaker   Acute exacerbation of idiopathic pulmonary fibrosis (HCC)   Diastolic dysfunction   Acute respiratory failure with hypoxia/SOB Likely due to idiopathic pulmonary fibrosis with exacerbation.  Patient also noted to have lower ejection fraction on her echocardiogram.  Cardiologist input was greatly appreciated.  On their personal  review EF is 55%.  There is abnormal septal motion due to pacing.  Unchanged from prior.   On exam she appears to be euvolemic and without CHF exacerbation.   No further work-up from cardiac standpoint needed at this time.   She can follow-up with cardiology as outpatient for possible consider coronary CTA.    Exacerbation of idiopathic pulmonary fibrosis Pulmonary following. Patient had significant mold exposure x4 years especially worse in the last few years. Interstitial lung disease flareup with new oxygen dependency this admission.  High-resolution CT chest alternative pattern to UIP but no clear description of air trapping She does not want any biopsy or invasive procedures.  The current flareup per pulmonary seems temporally related to mold exposure. Plan: -Under no circumstances patient is to return to her work atmosphere due to mold exposure there -OK to switch to p.o. prednisone April 05, 2020 at 50 mg/day x 2 weeks and then 40 mg/day x 2 weeks and then 30 mg/day x 2 weeks and then 20 mg/day x 2 weeks and then 10 mg/day to continue until further instructions  -Pulmonary will Obtain hypersensitive pneumonitis blood panel while in the hospital and blood QuantiFERON gold, and spirometry and DLCO while inpatient -I have asked case manager to meet with patient about disability information -In the long run she may be a candidate for lung transplant but she needs to lose weight    New chronic systolic CHF-ruled out by cardiology per #  1. No significant edema noted.  BNP is normal.  There is some history suggestive of orthopnea but no history suggestive of PND.  Symptoms most likely due to her pulmonary issues but the little diminished EF and global hypokinesis noted on echocardiogram is definitely new.  Was given lasix. Cards reviewed echo personally and appears to be EF 55%.  euvolemic and no exacerbation   History of complete heart block status post pacemaker Follows cards Dr.  Elberta Fortis.  Essential hypertension Stable, continue to monitor.  History of anxiety disorder Uses Valium at home.  Not on any long-acting medication.  This was discussed with patient.  She will discuss this further with her PCP.  She may benefit from being on a medication like Lexapro. Pulmonary discontinued lorazepam  Obesity Estimated body mass index is 34.72 kg/m as calculated from the following:   Height as of this encounter: 5\' 3"  (1.6 m).   Weight as of this encounter: 88.9 kg.  DVT Prophylaxis: Lovenox Code Status: Full code Family Communication:  Husband at bedside Disposition Plan:  Status is: Inpatient   Remains inpatient appropriate because:IV treatments appropriate due to intensity of illness or inability to take PO   Dispo:  Patient From: Home  Planned Disposition: Home  Expected discharge date: 04/05/20  Medically stable for discharge: No             LOS: 2 days   Time spent:35 minutes with more than 50% on COC    04/07/20, MD [facility] Pager [phone]  If 7PM-7AM, please contact night-coverage [email or web address] Password [facility] 04/04/2020, 8:44 AM

## 2020-04-04 NOTE — Social Work (Signed)
CSW acknowledging consult for DME/HH. Will follow for therapy recommendations needed to best determine disposition/for insurance authorization.  Information for [facility] Social Security office added to AVS. [facility] team cannot apply for disability on pts behalf.    Octavio Graves, MSW, LCSW [facility] Clinical Social Work

## 2020-04-05 LAB — CBC
HCT: 40.5 % (ref 36.0–46.0)
Hemoglobin: 13.7 g/dL (ref 12.0–15.0)
MCH: 30.8 pg (ref 26.0–34.0)
MCHC: 33.8 g/dL (ref 30.0–36.0)
MCV: 91 fL (ref 80.0–100.0)
Platelets: 221 10*3/uL (ref 150–400)
RBC: 4.45 MIL/uL (ref 3.87–5.11)
RDW: 12.1 % (ref 11.5–15.5)
WBC: 12.2 10*3/uL — ABNORMAL HIGH (ref 4.0–10.5)
nRBC: 0 % (ref 0.0–0.2)

## 2020-04-05 LAB — BASIC METABOLIC PANEL
Anion gap: 10 (ref 5–15)
BUN: 24 mg/dL — ABNORMAL HIGH (ref 6–20)
CO2: 30 mmol/L (ref 22–32)
Calcium: 9.1 mg/dL (ref 8.9–10.3)
Chloride: 96 mmol/L — ABNORMAL LOW (ref 98–111)
Creatinine, Ser: 0.77 mg/dL (ref 0.44–1.00)
GFR calc Af Amer: >60 mL/min (ref >60)
GFR calc non Af Amer: >60 mL/min (ref >60)
Glucose, Bld: 133 mg/dL — ABNORMAL HIGH (ref 70–99)
Potassium: 3.1 mmol/L — ABNORMAL LOW (ref 3.5–5.1)
Sodium: 136 mmol/L (ref 135–145)

## 2020-04-05 LAB — HEMOGLOBIN A1C
Hgb A1c MFr Bld: 5.6 % (ref 4.8–5.6)
Mean Plasma Glucose: 114.02 mg/dL

## 2020-04-05 MED ORDER — PREDNISONE 20 MG PO TABS
40.0000 mg | ORAL_TABLET | Freq: Every day | ORAL | 0 refills | Status: DC
Start: 2020-04-20 — End: 2020-04-08

## 2020-04-05 MED ORDER — PREDNISONE 50 MG PO TABS: 50.0000 mg | ORAL_TABLET | Freq: Every day | ORAL | 0 refills | Status: DC

## 2020-04-05 MED ORDER — FAMOTIDINE 20 MG PO TABS: 20.0000 mg | ORAL_TABLET | Freq: Every day | ORAL | 0 refills | Status: AC

## 2020-04-05 MED ORDER — PREDNISONE 20 MG PO TABS
20.0000 mg | ORAL_TABLET | Freq: Every day | ORAL | 0 refills | Status: DC
Start: 2020-05-20 — End: 2020-04-08

## 2020-04-05 MED ORDER — HYDROCHLOROTHIAZIDE 12.5 MG PO TABS: 12.5000 mg | ORAL_TABLET | Freq: Every day | ORAL | Status: DC

## 2020-04-05 MED ORDER — PREDNISONE 10 MG PO TABS: 30.0000 mg | ORAL_TABLET | Freq: Every day | ORAL | 0 refills | Status: DC

## 2020-04-05 MED ORDER — PREDNISONE 10 MG PO TABS
10.0000 mg | ORAL_TABLET | Freq: Every day | ORAL | 0 refills | Status: DC
Start: 2020-06-04 — End: 2020-04-08

## 2020-04-05 NOTE — TOC Transition Note (Signed)
Transition of Care [facility]) - CM/SW Discharge Note   Patient Details  Name: Anne Rush MRN: [id number] Date of Birth: 31-Jul-1961  [facility] CM/SW Contact:  Bess Kinds, RN Phone Number: [phone] 04/05/2020, 1:09 PM   Clinical Narrative:     Notified by MD of patient needing home oxygen. DME order placed by MD. Pulmonary note in place to support need. Referral placed to [employer] for delivery to room - delivery verified. Spoke with patient at the bedside to discuss home oxygen delivery. No further TOC needs identified.   Final next level of care: Home/Self Care Barriers to Discharge: No Barriers Identified   Patient Goals and CMS Choice Patient states their goals for this hospitalization and ongoing recovery are:: return home CMS Medicare.gov Compare Post Acute Care list provided to:: Patient Choice offered to / list presented to : Patient  Discharge Placement                       Discharge Plan and Services                DME Arranged: Oxygen DME Agency: [employer] Date DME Agency Contacted: 04/05/20 Time DME Agency Contacted: 1100 Representative spoke with at DME Agency: [place] HH Arranged: NA          Social Determinants of Health (SDOH) Interventions     Readmission Risk Interventions No flowsheet data found.

## 2020-04-05 NOTE — Progress Notes (Signed)
Pt IV's removed, catheter intact. Telemetry removed, ccmd aware. Pt discharge education provided at bedside. Pt has all belongings Including home oxygen, pt discharged via wheelchair with RN.

## 2020-04-05 NOTE — Discharge Summary (Signed)
Anne Rush:[id number] DOB: 08-18-1961 DOA: 04/01/2020  PCP: [name], MD  Admit date: 04/01/2020 Discharge date: 04/05/2020  Admitted From: home Disposition:  home  Recommendations for Outpatient Follow-up:  1. Follow up with PCP in 1 week 2. Please obtain BMP/CBC in one week   l   Discharge Condition stable CODE STATUS full Diet recommendan: Heart Healthy   Brief/Interim Summary: Anne Rush is a 59 y.o. female with medical history significant of hypertension, hyperlipidemia, anxiety, interstitial lung disease, complete heart block s/p PM who presented with complaints of shortness of breath over the last 9 days.  Walking up a flight of stairs in her home made her extremely short of breath to the point where she had to rest in order to catch her breath.  She noted associated symptoms with substernal chest tightness.  Associated symptoms include headache and a cough at least for the last 2 days with sputum production.Patient had been checking her pulse oximetry at home and noted that O2 saturations would range from 85% to 92%.  Normally she was not requiring oxygen to maintain her O2 saturations at baseline.  She has been more sedentary and notes that she has been more fatigued.  She has approximately a 40 smoking pack year history, but quit last year.  Patient had previously been diagnosed with interstitial lung disease with respiratory bronchiolitis interstitial lung disease by Dr. Toma Deiters pulmonology at [facility].  She last saw him back in 2016.  At home she reports that she is not on any inhalers or treatments. She was admitted to the hospital service.  Pulmonary was consulted.  Audiology was also consulted.  Acute respiratory failure with hypoxia/SOB Likely due to idiopathic pulmonary fibrosis with exacerbation. Patient also noted to have lower ejection fraction on her echocardiogram.  Cardiologist input was greatly appreciated.  On their personal review EF is 55%.  There  is abnormal septal motion due to pacing.  Unchanged from prior.   On exam she appears to be euvolemic and without CHF exacerbation.   No further work-up from cardiac standpoint needed at this time.   She can follow-up with cardiology as outpatient for possible consider coronary CTA.    Exacerbation of idiopathic pulmonary fibrosis Pulmonary following. Patient had significant mold exposure x4 years especially worse in the last few years. Interstitial lung disease flareup with new oxygen dependency this admission.High-resolution CT chest alternative pattern to UIP but no clear description of air trapping She does not want any biopsy or invasive procedures.  The current flareup per pulmonary seems temporally related to mold exposure. -She was instructed under no circumstances patient is to return to her work atmosphere due to mold exposure there She was switched from IV steroids to prednisone long taper per pulmonary instruction    Pulmonary obtaining hypersensitive pneumonitis blood panel while in the hospital and blood QuantiFERON gold, and spirometry and DLCO while inpatient Per pulmonary she may be a candidate for lung transplant but she needs to lose weight -On room air patient O2 saturation was 88% with ambulation and required 2 L to improve. She will need home O2 at 2 L at all times Follow-up with pulmonology as appointment has been scheduled  New chronic systolic CHF-ruled out by cardiology  No significant edema noted. BNP is normal. There is some history suggestive of orthopnea but no history suggestive of PND. Symptoms most likely due to her pulmonary issues but the little diminished EF and global hypokinesis noted on echocardiogram is definitely new.  Was given lasix. Per cardiology Dr. Carmon Ginsberg who reviewed echo personally and stated EF is 55% which is normal.   See instructions for #1 needs to follow-up as outpatient for coronary CTA    History of complete heart block  status post pacemaker Follows cards Dr. Elberta Fortis.  Essential hypertension Decreased her HCTZ to 12.5 mg daily as her blood pressure has been stable here  History of anxiety disorder Continue home meds on discharge   Obesity Estimated body mass index is 34.72 kg/m as calculated from the following: Height as of this encounter: 5\' 3"  (1.6 m). Weight as of this encounter: 88.9 kg.  Discharge Diagnoses:  Principal Problem:   Acute respiratory failure with hypoxia (HCC) Active Problems:   Essential hypertension   Complete heart block (HCC)   Pacemaker   Acute exacerbation of idiopathic pulmonary fibrosis (HCC)   Diastolic dysfunction    Discharge Instructions  Discharge Instructions    Call MD for:  difficulty breathing, headache or visual disturbances   Complete by: As directed    Diet - low sodium heart healthy   Complete by: As directed    Discharge instructions   Complete by: As directed    Follow-up with pulmonology on 7/30 at 12 PM with 8/30 Follow-up with your cardiologist in 1 week Follow-up with PCP in 1 week Cannot under any circumstance return to work   Increase activity slowly   Complete by: As directed      Allergies as of 04/05/2020      Reactions   Codeine Hives, Itching   Losartan Potassium Other (See Comments)   Neomycin-polymyxin-dexameth Itching, Swelling, Other (See Comments)   Swelling and itching of eye    Oxycodone-acetaminophen Other (See Comments)   REACTION: unspecified   Paroxetine Other (See Comments)   unknown   Sulfonamide Derivatives Other (See Comments)   REACTION: unspecified   Lisinopril Rash   Sulfa Antibiotics Hives, Rash         Medication List    TAKE these medications   carvedilol 25 MG tablet Commonly known as: COREG TAKE ONE TABLET BY MOUTH TWICE A DAY WITH MEALS   cholecalciferol 25 MCG (1000 UNIT) tablet Commonly known as: VITAMIN D3 Take 1,000 Units by mouth daily.   diazepam 5 MG  tablet Commonly known as: VALIUM Take 1 tablet (5 mg total) by mouth every 8 (eight) hours as needed for anxiety. What changed: when to take this   famotidine 20 MG tablet Commonly known as: PEPCID Take 1 tablet (20 mg total) by mouth daily. Start taking on: April 06, 2020   hydrochlorothiazide 12.5 MG tablet Commonly known as: HYDRODIURIL Take 1 tablet (12.5 mg total) by mouth daily. What changed:   medication strength  how much to take   MAGNESIUM PO Take 1 tablet by mouth daily.   nortriptyline 10 MG capsule Commonly known as: PAMELOR Take 10 mg by mouth at bedtime.   predniSONE 50 MG tablet Commonly known as: DELTASONE Take 1 tablet (50 mg total) by mouth daily with breakfast for 13 days. Start taking on: April 06, 2020   predniSONE 20 MG tablet Commonly known as: DELTASONE Take 2 tablets (40 mg total) by mouth daily for 14 days. Start taking on: April 20, 2020   predniSONE 10 MG tablet Commonly known as: DELTASONE Take 3 tablets (30 mg total) by mouth daily for 14 days. Start taking on: May 05, 2020   predniSONE 20 MG tablet Commonly known as: DELTASONE Take 1  tablet (20 mg total) by mouth daily for 14 days. Start taking on: May 20, 2020   predniSONE 10 MG tablet Commonly known as: DELTASONE Take 1 tablet (10 mg total) by mouth daily. Start taking on: June 04, 2020   spironolactone 25 MG tablet Commonly known as: ALDACTONE Take 1 tablet (25 mg total) by mouth daily.   vitamin C 1000 MG tablet Take 1,000 mg by mouth daily.   Zinc 30 MG Tabs Take 30 mg by mouth daily.            Durable Medical Equipment  (From admission, onward)         Start     Ordered   04/05/20 1038  For home use only DME oxygen  Once       Question Answer Comment  Length of Need Lifetime   Mode or (Route) Nasal cannula   Liters per Minute 2   Frequency Continuous (stationary and portable oxygen unit needed)   Oxygen conserving device Yes   Oxygen  delivery system Gas      04/05/20 1037          Follow-up Information    O'Neal, Ronnald Ramp, MD. Go on 05/13/2020.   Specialties: Internal Medicine, Cardiology, Radiology Why:  for hospital cardiology follow up  Contact information: [address] [place] [address] [phone]        Social Security Administration Follow up.   Why: To initiate disability application please f/u with the local social security administration office.  Contact information: SOCIAL SECURITY ADMINSTRATION  Social Security Office [address] [place], [place] [address] [phone] [email or web address]          Copland, Karleen Hampshire, MD Follow up in 1 week(s).   Specialties: Family Medicine, Sports Medicine Contact information: [address] [place] [address] [phone]              Allergies  Allergen Reactions  . Codeine Hives and Itching  . Losartan Potassium Other (See Comments)  . Neomycin-Polymyxin-Dexameth Itching, Swelling and Other (See Comments)    Swelling and itching of eye   . Oxycodone-Acetaminophen Other (See Comments)    REACTION: unspecified  . Paroxetine Other (See Comments)    unknown  . Sulfonamide Derivatives Other (See Comments)    REACTION: unspecified  . Lisinopril Rash  . Sulfa Antibiotics Hives and Rash         Consultations:  Pulmonary and cardiology   Procedures/Studies: DG Chest 2 View  Result Date: 04/01/2020 CLINICAL DATA:  Chest pain, shortness of breath EXAM: CHEST - 2 VIEW COMPARISON:  07/04/2019 FINDINGS: Left-sided implanted cardiac device. The heart size and mediastinal contours are within normal limits. Chronically coarsened bilateral interstitial markings, similar in appearance to prior. No definite superimposed airspace opacity. No pleural effusion or pneumothorax. The visualized skeletal structures are unremarkable. IMPRESSION: Chronic interstitial lung changes. No definite superimposed acute  cardiopulmonary process. Electronically Signed   By: Duanne Guess D.O.   On: 04/01/2020 15:24   CT Angio Chest PE W and/or Wo Contrast  Result Date: 04/02/2020 CLINICAL DATA:  59 year old female with shortness of breath and chest tightness for 8 days. EXAM: CT ANGIOGRAPHY CHEST WITH CONTRAST TECHNIQUE: Multidetector CT imaging of the chest was performed using the standard protocol during bolus administration of intravenous contrast. Multiplanar CT image reconstructions and MIPs were obtained to evaluate the vascular anatomy. CONTRAST:  75mL OMNIPAQUE IOHEXOL 350 MG/ML SOLN COMPARISON:  Chest radiographs yesterday. High-resolution chest CT 10/08/2010. FINDINGS: Cardiovascular:  Good contrast bolus timing in the pulmonary arterial tree. No focal filling defect identified in the pulmonary arteries to suggest acute pulmonary embolism. Left chest cardiac pacemaker device. Cardiac size at the upper limits of normal. No pericardial effusion. Negative visible aorta aside from mild atherosclerosis. No calcified coronary artery atherosclerosis is evident. Mediastinum/Nodes: Negative.  No lymphadenopathy. Lungs/Pleura: Major airways are patent. There is generalized pulmonary ground-glass opacity with bilateral increased subpleural opacity and upper lobe architectural distortion perhaps due to honeycombing (series 6, image 34). Very early similar changes were present on a 2012 chest CT where UIP was favored. Peripheral and dependent patchy opacity at both lung bases. No pleural effusion. No consolidation. Upper Abdomen: Small chronic rim calcified nodule in the gastrohepatic ligament is most compatible with the chronic postinflammatory lymph node, mildly increased in conspicuity since 2012. Otherwise negative visible upper abdomen including the visible liver, gallbladder, spleen, adrenal glands. Musculoskeletal: No acute osseous abnormality identified. Incomplete segmentation of T2-T3. Review of the MIP images confirms  the above findings. IMPRESSION: 1. Negative for acute pulmonary embolus. 2. Chronic interstitial lung disease with substantial progression since a 2012 high-resolution CT. Superimposed acute respiratory infection would be difficult to exclude but is not favored. Electronically Signed   By: Odessa Fleming M.D.   On: 04/02/2020 02:10   CT Chest High Resolution  Result Date: 04/03/2020 CLINICAL DATA:  Inpatient. Evaluate interstitial lung disease. Dyspnea. EXAM: CT CHEST WITHOUT CONTRAST TECHNIQUE: Multidetector CT imaging of the chest was performed following the standard protocol without intravenous contrast. High resolution imaging of the lungs, as well as inspiratory and expiratory imaging, was performed. COMPARISON:  04/02/2020 chest CT angiogram.  10/08/2010 chest CT. FINDINGS: Cardiovascular: Normal heart size. No significant pericardial effusion/thickening. Two lead left subclavian pacemaker with lead tips in the right atrium and right ventricular apex. Mildly atherosclerotic nonaneurysmal thoracic aorta. Normal caliber pulmonary arteries. Mediastinum/Nodes: No discrete thyroid nodules. Unremarkable esophagus. No pathologically enlarged axillary, mediastinal or hilar lymph nodes, noting limited sensitivity for the detection of hilar adenopathy on this noncontrast study. Lungs/Pleura: No pneumothorax. No pleural effusion. Mild centrilobular and paraseptal emphysema in the upper lobes. No acute consolidative airspace disease or lung masses. Right middle lobe solid 7 mm pulmonary nodule along the minor fissure (series 7/image 65), stable since 2012 CT, considered benign. No additional significant pulmonary nodules. Patchy confluent peripheral reticulation and peripheral lines throughout both lungs with associated mild traction bronchiectasis and architectural distortion, with regions of relative sparing of the immediate subpleural lungs. No clear apicobasilar gradient to these findings. No frank honeycombing (mild  subpleural cystic changes in the upper lobes are favored represent paraseptal emphysema). These findings have progressed significantly since 10/08/2010 chest CT. Upper abdomen: No acute abnormality. Musculoskeletal: No aggressive appearing focal osseous lesions. Moderate thoracic spondylosis. IMPRESSION: 1. Spectrum of findings compatible with fibrotic interstitial lung disease without frank honeycombing or clear apicobasilar gradient. Findings have progressed since 2012 chest CT. The regions of immediate subpleural sparing are more suggestive of fibrotic phase nonspecific interstitial pneumonia (NSIP), although usual interstitial pneumonia (UIP) is on the differential particularly given progression. Findings are indeterminate for UIP per consensus guidelines: Diagnosis of Idiopathic Pulmonary Fibrosis: An Official ATS/ERS/JRS/ALAT Clinical Practice Guideline. Am Rosezetta Schlatter Crit Care Med Vol 198, Iss 5, [phone], May 21 2017. 2. Aortic Atherosclerosis (ICD10-I70.0) and Emphysema (ICD10-J43.9). Electronically Signed   By: Delbert Phenix M.D.   On: 04/03/2020 17:23   ECHOCARDIOGRAM COMPLETE  Result Date: 04/02/2020    ECHOCARDIOGRAM REPORT   Patient Name:  Marcelyn Ditty Date of Exam: 04/02/2020 Medical Rec #:  [id number]     Height:       63.0 in Accession #:    [id number]    Weight:       205.0 lb Date of Birth:  August 18, 1961     BSA:          1.954 m Patient Age:    59 years      BP:           117/83 mmHg Patient Gender: F             HR:           93 bpm. Exam Location:  Inpatient Procedure: 2D Echo, Cardiac Doppler and Color Doppler Indications:    Dyspnea 786.09 / R06.00  History:        Patient has prior history of Echocardiogram examinations, most                 recent 03/29/2017. Pacemaker; Risk Factors:Hypertension and                 Dyslipidemia. Cancer.  Sonographer:    Tiffany Dance Referring Phys: [id number] RONDELL A SMITH IMPRESSIONS  1. Septal-lateral dyskinesis. Left ventricular ejection fraction, by  estimation, is 45 to 50%. The left ventricle has mildly decreased function. The left ventricle demonstrates global hypokinesis. There is mild concentric left ventricular hypertrophy. Left ventricular diastolic parameters are consistent with Grade I diastolic dysfunction (impaired relaxation).  2. Right ventricular systolic function is normal. The right ventricular size is normal.  3. The mitral valve is normal in structure. No evidence of mitral valve regurgitation. No evidence of mitral stenosis.  4. The aortic valve is tricuspid. Aortic valve regurgitation is not visualized. No aortic stenosis is present.  5. The inferior vena cava is normal in size with greater than 50% respiratory variability, suggesting right atrial pressure of 3 mmHg. FINDINGS  Left Ventricle: Septal-lateral dyskinesis. Left ventricular ejection fraction, by estimation, is 45 to 50%. The left ventricle has mildly decreased function. The left ventricle demonstrates global hypokinesis. The left ventricular internal cavity size was normal in size. There is mild concentric left ventricular hypertrophy. Left ventricular diastolic parameters are consistent with Grade I diastolic dysfunction (impaired relaxation). Indeterminate filling pressures. Right Ventricle: The right ventricular size is normal. No increase in right ventricular wall thickness. Right ventricular systolic function is normal. Left Atrium: Left atrial size was normal in size. Right Atrium: Right atrial size was normal in size. Pericardium: There is no evidence of pericardial effusion. Mitral Valve: The mitral valve is normal in structure. Normal mobility of the mitral valve leaflets. No evidence of mitral valve regurgitation. No evidence of mitral valve stenosis. Tricuspid Valve: The tricuspid valve is normal in structure. Tricuspid valve regurgitation is trivial. No evidence of tricuspid stenosis. Aortic Valve: The aortic valve is tricuspid. Aortic valve regurgitation is not  visualized. No aortic stenosis is present. Pulmonic Valve: The pulmonic valve was normal in structure. Pulmonic valve regurgitation is not visualized. No evidence of pulmonic stenosis. Aorta: The aortic root is normal in size and structure. Venous: The inferior vena cava is normal in size with greater than 50% respiratory variability, suggesting right atrial pressure of 3 mmHg. IAS/Shunts: No atrial level shunt detected by color flow Doppler. Additional Comments: A pacer wire is visualized.  LEFT VENTRICLE PLAX 2D LVIDd:         3.99 cm  Diastology LVIDs:  3.07 cm  LV e' lateral:   4.03 cm/s LV PW:         1.07 cm  LV E/e' lateral: 21.5 LV IVS:        1.15 cm  LV e' medial:    4.68 cm/s LVOT diam:     2.00 cm  LV E/e' medial:  18.5 LV SV:         47 LV SV Index:   24 LVOT Area:     3.14 cm  RIGHT VENTRICLE             IVC RV Basal diam:  2.64 cm     IVC diam: 1.98 cm RV S prime:     10.10 cm/s TAPSE (M-mode): 1.2 cm LEFT ATRIUM             Index       RIGHT ATRIUM           Index LA diam:        3.10 cm 1.59 cm/m  RA Area:     10.70 cm LA Vol (A2C):   43.2 ml 22.11 ml/m RA Volume:   21.50 ml  11.00 ml/m LA Vol (A4C):   36.4 ml 18.63 ml/m LA Biplane Vol: 39.9 ml 20.42 ml/m  AORTIC VALVE LVOT Vmax:   82.40 cm/s LVOT Vmean:  50.450 cm/s LVOT VTI:    0.150 m  AORTA Ao Root diam: 3.10 cm Ao Asc diam:  2.80 cm MITRAL VALVE MV Area (PHT): 5.13 cm    SHUNTS MV Decel Time: 148 msec    Systemic VTI:  0.15 m MV E velocity: 86.60 cm/s  Systemic Diam: 2.00 cm Chilton Si MD Electronically signed by Chilton Si MD Signature Date/Time: 04/02/2020/2:57:14 PM    Final    CUP PACEART REMOTE DEVICE CHECK  Result Date: 03/14/2020 Scheduled remote reviewed. Normal device function.  Next remote 91 days. Salomon Fick, RN, MSN      Subjective: Feels much better today.  No other complaints  Discharge Exam: Vitals:   04/05/20 0518 04/05/20 0845  BP: 129/80 130/88  Pulse: 90 73  Resp: 18   Temp: 97.6 F  (36.4 C)   SpO2: 94%    Vitals:   04/04/20 1736 04/05/20 0016 04/05/20 0518 04/05/20 0845  BP: 122/80 134/83 129/80 130/88  Pulse: 88 86 90 73  Resp: Temp: 97.8 F (36.6 C) 97.7 F (36.5 C) 97.6 F (36.4 C)   TempSrc: Oral Oral Oral   SpO2: 95% 95% 94%   Weight:   89.9 kg   Height:        General: Pt is alert, awake, not in acute distress Cardiovascular: RRR, S1/S2 +, no rubs, no gallops Respiratory: And expiratory fine Velcro rails bilaterally Abdominal: Soft, NT, ND, bowel sounds + Extremities: no edema, no cyanosis    The results of significant diagnostics from this hospitalization (including imaging, microbiology, ancillary and laboratory) are listed below for reference.     Microbiology: Recent Results (from the past 240 hour(s))  SARS Coronavirus 2 by RT PCR (hospital order, performed in [facility] hospital lab) Nasopharyngeal Nasopharyngeal Swab     Status: None   Collection Time: 04/02/20 12:40 AM   Specimen: Nasopharyngeal Swab  Result Value Ref Range Status   SARS Coronavirus 2 NEGATIVE NEGATIVE Final    Comment: (NOTE) SARS-CoV-2 target nucleic acids are NOT DETECTED.  The SARS-CoV-2 RNA is generally detectable in upper and lower respiratory specimens during the  acute phase of infection. The lowest concentration of SARS-CoV-2 viral copies this assay can detect is 250 copies / mL. A negative result does not preclude SARS-CoV-2 infection and should not be used as the sole basis for treatment or other patient management decisions.  A negative result may occur with improper specimen collection / handling, submission of specimen other than nasopharyngeal swab, presence of viral mutation(s) within the areas targeted by this assay, and inadequate number of viral copies (<250 copies / mL). A negative result must be combined with clinical observations, patient history, and epidemiological information.  Fact Sheet for Patients:    BoilerBrush.com.cy  Fact Sheet for Healthcare Providers: https://pope.com/  This test is not yet approved or  cleared by the [place] FDA and has been authorized for detection and/or diagnosis of SARS-CoV-2 by FDA under an Emergency Use Authorization (EUA).  This EUA will remain in effect (meaning this test can be used) for the duration of the COVID-19 declaration under Section 564(b)(1) of the Act, 21 U.S.C. section 360bbb-3(b)(1), unless the authorization is terminated or revoked sooner.  Performed at [facility], [address], [place], [place] [address]   Respiratory Panel by PCR     Status: None   Collection Time: 04/03/20  2:48 PM   Specimen: Nasopharyngeal Swab; Respiratory  Result Value Ref Range Status   Adenovirus NOT DETECTED NOT DETECTED Final   Coronavirus 229E NOT DETECTED NOT DETECTED Final    Comment: (NOTE) The Coronavirus on the Respiratory Panel, DOES NOT test for the novel  Coronavirus (2019 nCoV)    Coronavirus HKU1 NOT DETECTED NOT DETECTED Final   Coronavirus NL63 NOT DETECTED NOT DETECTED Final   Coronavirus OC43 NOT DETECTED NOT DETECTED Final   Metapneumovirus NOT DETECTED NOT DETECTED Final   Rhinovirus / Enterovirus NOT DETECTED NOT DETECTED Final   Influenza A NOT DETECTED NOT DETECTED Final   Influenza B NOT DETECTED NOT DETECTED Final   Parainfluenza Virus 1 NOT DETECTED NOT DETECTED Final   Parainfluenza Virus 2 NOT DETECTED NOT DETECTED Final   Parainfluenza Virus 3 NOT DETECTED NOT DETECTED Final   Parainfluenza Virus 4 NOT DETECTED NOT DETECTED Final   Respiratory Syncytial Virus NOT DETECTED NOT DETECTED Final   Bordetella pertussis NOT DETECTED NOT DETECTED Final   Chlamydophila pneumoniae NOT DETECTED NOT DETECTED Final   Mycoplasma pneumoniae NOT DETECTED NOT DETECTED Final    Comment: Performed at [facility], [address] [place], [place] [address]      Labs: BNP (last 3 results) Recent Labs    04/02/20 1857  BNP 21.9   Basic Metabolic Panel: Recent Labs  Lab 04/01/20 1449 04/03/20 0518 04/04/20 0440  NA 138 138 137  K 3.8 3.9 3.9  CL 100 99 96*  CO2 24 25 26   GLUCOSE 103* 171* 155*  BUN 9 16 21*  CREATININE 0.68 0.84 0.79  CALCIUM 9.8 9.6 10.0   Liver Function Tests: Recent Labs  Lab 04/01/20 1653  AST 26  ALT 19  ALKPHOS 54  BILITOT 1.0  PROT 7.8  ALBUMIN 4.2   No results for input(s): LIPASE, AMYLASE in the last 168 hours. No results for input(s): AMMONIA in the last 168 hours. CBC: Recent Labs  Lab 04/01/20 1449 04/03/20 0518 04/04/20 0440 04/05/20 1059  WBC 6.6 6.8 13.9* 12.2*  HGB 15.3* 15.2* 15.1* 13.7  HCT 43.8 44.1 43.2 40.5  MCV 90.1 89.3 90.8 91.0  PLT 277 259 257 221   Cardiac  Enzymes: No results for input(s): CKTOTAL, CKMB, CKMBINDEX, TROPONINI in the last 168 hours. BNP: Invalid input(s): POCBNP CBG: No results for input(s): GLUCAP in the last 168 hours. D-Dimer No results for input(s): DDIMER in the last 72 hours. Hgb A1c Recent Labs    04/05/20 1059  HGBA1C 5.6   Lipid Profile No results for input(s): CHOL, HDL, LDLCALC, TRIG, CHOLHDL, LDLDIRECT in the last 72 hours. Thyroid function studies Recent Labs    04/02/20 1426  TSH 2.676   Anemia work up No results for input(s): VITAMINB12, FOLATE, FERRITIN, TIBC, IRON, RETICCTPCT in the last 72 hours. Urinalysis    Component Value Date/Time   COLORURINE yellow 08/12/2009 1128   APPEARANCEUR Clear 04/12/2018 1050   LABSPEC 1.010 08/12/2009 1128   PHURINE 7.5 08/12/2009 1128   GLUCOSEU Negative 04/12/2018 1050   HGBUR trace-intact 08/12/2009 1128   BILIRUBINUR Negative 04/12/2018 1050   PROTEINUR Negative 04/12/2018 1050   UROBILINOGEN 0.2 08/12/2009 1128   NITRITE Negative 04/12/2018 1050   NITRITE negative 08/12/2009 1128   LEUKOCYTESUR Negative 04/12/2018 1050   Sepsis Labs Invalid input(s): PROCALCITONIN,  WBC,   LACTICIDVEN Microbiology Recent Results (from the past 240 hour(s))  SARS Coronavirus 2 by RT PCR (hospital order, performed in [facility] hospital lab) Nasopharyngeal Nasopharyngeal Swab     Status: None   Collection Time: 04/02/20 12:40 AM   Specimen: Nasopharyngeal Swab  Result Value Ref Range Status   SARS Coronavirus 2 NEGATIVE NEGATIVE Final    Comment: (NOTE) SARS-CoV-2 target nucleic acids are NOT DETECTED.  The SARS-CoV-2 RNA is generally detectable in upper and lower respiratory specimens during the acute phase of infection. The lowest concentration of SARS-CoV-2 viral copies this assay can detect is 250 copies / mL. A negative result does not preclude SARS-CoV-2 infection and should not be used as the sole basis for treatment or other patient management decisions.  A negative result may occur with improper specimen collection / handling, submission of specimen other than nasopharyngeal swab, presence of viral mutation(s) within the areas targeted by this assay, and inadequate number of viral copies (<250 copies / mL). A negative result must be combined with clinical observations, patient history, and epidemiological information.  Fact Sheet for Patients:   BoilerBrush.com.cy  Fact Sheet for Healthcare Providers: https://pope.com/  This test is not yet approved or  cleared by the [place] FDA and has been authorized for detection and/or diagnosis of SARS-CoV-2 by FDA under an Emergency Use Authorization (EUA).  This EUA will remain in effect (meaning this test can be used) for the duration of the COVID-19 declaration under Section 564(b)(1) of the Act, 21 U.S.C. section 360bbb-3(b)(1), unless the authorization is terminated or revoked sooner.  Performed at [facility], [address] [place], [place] [address]   Respiratory Panel by PCR     Status: None   Collection Time: 04/03/20  2:48 PM   Specimen:  Nasopharyngeal Swab; Respiratory  Result Value Ref Range Status   Adenovirus NOT DETECTED NOT DETECTED Final   Coronavirus 229E NOT DETECTED NOT DETECTED Final    Comment: (NOTE) The Coronavirus on the Respiratory Panel, DOES NOT test for the novel  Coronavirus (2019 nCoV)    Coronavirus HKU1 NOT DETECTED NOT DETECTED Final   Coronavirus NL63 NOT DETECTED NOT DETECTED Final   Coronavirus OC43 NOT DETECTED NOT DETECTED Final   Metapneumovirus NOT DETECTED NOT DETECTED Final   Rhinovirus / Enterovirus NOT DETECTED NOT DETECTED Final   Influenza A NOT  DETECTED NOT DETECTED Final   Influenza B NOT DETECTED NOT DETECTED Final   Parainfluenza Virus 1 NOT DETECTED NOT DETECTED Final   Parainfluenza Virus 2 NOT DETECTED NOT DETECTED Final   Parainfluenza Virus 3 NOT DETECTED NOT DETECTED Final   Parainfluenza Virus 4 NOT DETECTED NOT DETECTED Final   Respiratory Syncytial Virus NOT DETECTED NOT DETECTED Final   Bordetella pertussis NOT DETECTED NOT DETECTED Final   Chlamydophila pneumoniae NOT DETECTED NOT DETECTED Final   Mycoplasma pneumoniae NOT DETECTED NOT DETECTED Final    Comment: Performed at [facility], [address] [place], [place] [address]     Time coordinating discharge: Over 30 minutes  SIGNED:   Lynn Ito, MD  [facility] 04/05/2020, 12:02 PM Pager   If 7PM-7AM, please contact night-coverage [email or web address] Password [email or web address]

## 2020-04-05 NOTE — Progress Notes (Addendum)
SATURATION QUALIFICATIONS: (This note is used to comply with regulatory documentation for home oxygen)  Patient Saturations on Room Air at Rest = 96%  Patient Saturations on Room Air while Ambulating = 88%  Patient Saturations on 2Liters of oxygen while Ambulating = 100%  Please briefly explain why patient needs home oxygen: Pt ambulated to 5N and back to room with NT, pt stated she felt  chest tightness and shortness of breath. Pt states pulmonologist recommended home oxygen prior to admission.

## 2020-04-07 ENCOUNTER — Ambulatory Visit: Payer: BC Managed Care – PPO | Admitting: Family Medicine

## 2020-04-07 LAB — HYPERSENSITIVITY PNEUMONITIS
A. Pullulans Abs: NEGATIVE
A.Fumigatus #1 Abs: NEGATIVE
Micropolyspora faeni, IgG: NEGATIVE
Pigeon Serum Abs: NEGATIVE
Thermoact. Saccharii: NEGATIVE
Thermoactinomyces vulgaris, IgG: NEGATIVE

## 2020-04-07 LAB — QUANTIFERON-TB GOLD PLUS

## 2020-04-08 ENCOUNTER — Other Ambulatory Visit: Payer: Self-pay | Admitting: Family Medicine

## 2020-04-08 MED ORDER — PREDNISONE 10 MG PO TABS: 40.0000 mg | ORAL_TABLET | Freq: Every day | ORAL | 0 refills | Status: DC

## 2020-04-08 MED ORDER — PREDNISONE 10 MG PO TABS: 30.0000 mg | ORAL_TABLET | Freq: Every day | ORAL | 0 refills | Status: DC

## 2020-04-08 MED ORDER — PREDNISONE 10 MG PO TABS: 10.0000 mg | ORAL_TABLET | Freq: Every day | ORAL | 0 refills | Status: DC

## 2020-04-08 MED ORDER — PREDNISONE 10 MG PO TABS: 20.0000 mg | ORAL_TABLET | Freq: Every day | ORAL | 0 refills | Status: DC

## 2020-04-08 MED ORDER — PREDNISONE 50 MG PO TABS: 50.0000 mg | ORAL_TABLET | Freq: Every day | ORAL | 0 refills | Status: AC

## 2020-04-09 ENCOUNTER — Other Ambulatory Visit: Payer: Self-pay

## 2020-04-09 ENCOUNTER — Encounter: Payer: Self-pay | Admitting: Family Medicine

## 2020-04-09 ENCOUNTER — Ambulatory Visit: Payer: BC Managed Care – PPO | Admitting: Family Medicine

## 2020-04-09 VITALS — BP 130/90 | HR 77 | Temp 97.1°F | Ht 63.0 in | Wt 202.2 lb

## 2020-04-09 DIAGNOSIS — J84112 Idiopathic pulmonary fibrosis: Secondary | ICD-10-CM

## 2020-04-09 DIAGNOSIS — E876 Hypokalemia: Secondary | ICD-10-CM

## 2020-04-09 DIAGNOSIS — J9601 Acute respiratory failure with hypoxia: Secondary | ICD-10-CM | POA: Diagnosis not present

## 2020-04-09 DIAGNOSIS — I5189 Other ill-defined heart diseases: Secondary | ICD-10-CM | POA: Diagnosis not present

## 2020-04-09 DIAGNOSIS — F411 Generalized anxiety disorder: Secondary | ICD-10-CM

## 2020-04-09 DIAGNOSIS — I442 Atrioventricular block, complete: Secondary | ICD-10-CM

## 2020-04-09 DIAGNOSIS — Z87891 Personal history of nicotine dependence: Secondary | ICD-10-CM

## 2020-04-09 LAB — BASIC METABOLIC PANEL
BUN: 21 mg/dL (ref 6–23)
CO2: 28 mEq/L (ref 19–32)
Calcium: 9.8 mg/dL (ref 8.4–10.5)
Chloride: 99 mEq/L (ref 96–112)
Creatinine, Ser: 0.78 mg/dL (ref 0.40–1.20)
GFR: 76.03 mL/min (ref >60.00)
Glucose, Bld: 134 mg/dL — ABNORMAL HIGH (ref 70–99)
Potassium: 4.4 mEq/L (ref 3.5–5.1)
Sodium: 137 mEq/L (ref 135–145)

## 2020-04-09 LAB — CBC WITH DIFFERENTIAL/PLATELET
Basophils Absolute: 0 10*3/uL (ref 0.0–0.1)
Basophils Relative: 0.1 % (ref 0.0–3.0)
Eosinophils Absolute: 0 10*3/uL (ref 0.0–0.7)
Eosinophils Relative: 0.2 % (ref 0.0–5.0)
HCT: 44.8 % (ref 36.0–46.0)
Hemoglobin: 15.4 g/dL — ABNORMAL HIGH (ref 12.0–15.0)
Lymphocytes Relative: 19.1 % (ref 12.0–46.0)
Lymphs Abs: 1.9 10*3/uL (ref 0.7–4.0)
MCHC: 34.4 g/dL (ref 30.0–36.0)
MCV: 92 fl (ref 78.0–100.0)
Monocytes Absolute: 0.5 10*3/uL (ref 0.1–1.0)
Monocytes Relative: 4.8 % (ref 3.0–12.0)
Neutro Abs: 7.4 10*3/uL (ref 1.4–7.7)
Neutrophils Relative %: 75.8 % (ref 43.0–77.0)
Platelets: 252 10*3/uL (ref 150.0–400.0)
RBC: 4.87 Mil/uL (ref 3.87–5.11)
RDW: 12.8 % (ref 11.5–15.5)
WBC: 9.8 10*3/uL (ref 4.0–10.5)

## 2020-04-09 NOTE — Progress Notes (Signed)
Anne Mullan [name], MD, CAQ Sports Medicine  Primary Care and Sports Medicine [facility] [address] [place], [address]  Phone: [phone]  FAX: [phone]  Anne Rush - 59 y.o. female  MRN [id number]  Date of Birth: 03/13/1961  Date: 04/09/2020  PCP: [name], MD  Referral: [name], MD  Chief Complaint  Patient presents with  . Hospitalization Follow-up    This visit occurred during the SARS-CoV-2 public health emergency.  Safety protocols were in place, including screening questions prior to the visit, additional usage of staff PPE, and extensive cleaning of exam room while observing appropriate contact time as indicated for disinfecting solutions.   Subjective:   Anne Rush is a 59 y.o. very pleasant female patient who presents with the following:  She is here for evaluation and follow-up regarding her acute hypoxic respiratory failure secondary to worsening pulmonary fibrosis.  Admit date: 04/01/2020 Discharge date: 04/05/2020  Recommendations for Outpatient Follow-up:  1. Follow up with PCP in 1 week 2. Please obtain BMP/CBC in one week  She does have a history of having some interstitial lung disease/pulmonary fibrosis as well as complete heart block who presented with some worsening of breath to the emergency room. While she was checking her pulse ox at home her sats were between 85 to 92%.  And at baseline she does not require any oxygen.  Previously she was seen by Dr. Sandy Salaam at [facility], but it has been approximately 5 years since he was managing her pulmonary fibrosis.  Currently, she is not on any inhaled medication.  She was managed by the hospitalist as well as the pulmonary/critical care team.  They placed her on some IV Solu-Medrol and then long-term oral steroids at her time of discharge.  New onset diagnosis of congestive heart failure.  Very slightly diminished EF on echocardiogram with  some hypokinesis.  She will have ongoing follow-up with cardiology.  Dr. Elberta Fortis did implant her pacemaker.  She already has follow-ups with pulmonology and cardiology.  CMP Latest Ref Rng & Units 04/05/2020 04/04/2020 04/03/2020  Glucose 70 - 99 mg/dL 132(G) 401(U) 272(Z)  BUN 6 - 20 mg/dL 36(U) 44(I) 16  Creatinine 0.44 - 1.00 mg/dL 3.47 4.25 9.56  Sodium 135 - 145 mmol/L 136 137 138  Potassium 3.5 - 5.1 mmol/L 3.1(L) 3.9 3.9  Chloride 98 - 111 mmol/L 96(L) 96(L) 99  CO2 22 - 32 mmol/L 30 26 25   Calcium 8.9 - 10.3 mg/dL 9.1 9.6  Total Protein 6.5 - 8.1 g/dL - - -  Total Bilirubin 0.3 - 1.2 mg/dL - - -  Alkaline Phos 38 - 126 U/L - - -  AST 15 - 41 U/L - - -  ALT 0 - 44 U/L - - -     Review of Systems is noted in the HPI, as appropriate  Objective:   BP 130/90   Pulse 77   Temp (!) 97.1 F (36.2 C) (Temporal)   Ht 5\' 3"  (1.6 m)   Wt 202 lb 4 oz (91.7 kg)   SpO2 97% Comment: 2 L O2  BMI 35.83 kg/m   GEN: WDWN, NAD, Non-toxic HEENT: Atraumatic, Normocephalic. Neck supple. No masses. CV: RRR, No M/G/R. No JVD. No thrill. No extra heart sounds. PULM: CTA B, no wheezes, crackles, rhonchi. No retractions. No resp. distress. No accessory muscle use. EXTR: No c/c/e NEURO Normal gait.  PSYCH: Normally interactive. Conversant.   Laboratory and Imaging  Data: DG Chest 2 View  Result Date: 04/01/2020 CLINICAL DATA:  Chest pain, shortness of breath EXAM: CHEST - 2 VIEW COMPARISON:  07/04/2019 FINDINGS: Left-sided implanted cardiac device. The heart size and mediastinal contours are within normal limits. Chronically coarsened bilateral interstitial markings, similar in appearance to prior. No definite superimposed airspace opacity. No pleural effusion or pneumothorax. The visualized skeletal structures are unremarkable. IMPRESSION: Chronic interstitial lung changes. No definite superimposed acute cardiopulmonary process. Electronically Signed   By: Duanne Guess D.O.   On:  04/01/2020 15:24   CT Angio Chest PE W and/or Wo Contrast  Result Date: 04/02/2020 CLINICAL DATA:  59 year old female with shortness of breath and chest tightness for 8 days. EXAM: CT ANGIOGRAPHY CHEST WITH CONTRAST TECHNIQUE: Multidetector CT imaging of the chest was performed using the standard protocol during bolus administration of intravenous contrast. Multiplanar CT image reconstructions and MIPs were obtained to evaluate the vascular anatomy. CONTRAST:  79mL OMNIPAQUE IOHEXOL 350 MG/ML SOLN COMPARISON:  Chest radiographs yesterday. High-resolution chest CT 10/08/2010. FINDINGS: Cardiovascular: Good contrast bolus timing in the pulmonary arterial tree. No focal filling defect identified in the pulmonary arteries to suggest acute pulmonary embolism. Left chest cardiac pacemaker device. Cardiac size at the upper limits of normal. No pericardial effusion. Negative visible aorta aside from mild atherosclerosis. No calcified coronary artery atherosclerosis is evident. Mediastinum/Nodes: Negative.  No lymphadenopathy. Lungs/Pleura: Major airways are patent. There is generalized pulmonary ground-glass opacity with bilateral increased subpleural opacity and upper lobe architectural distortion perhaps due to honeycombing (series 6, image 34). Very early similar changes were present on a 2012 chest CT where UIP was favored. Peripheral and dependent patchy opacity at both lung bases. No pleural effusion. No consolidation. Upper Abdomen: Small chronic rim calcified nodule in the gastrohepatic ligament is most compatible with the chronic postinflammatory lymph node, mildly increased in conspicuity since 2012. Otherwise negative visible upper abdomen including the visible liver, gallbladder, spleen, adrenal glands. Musculoskeletal: No acute osseous abnormality identified. Incomplete segmentation of T2-T3. Review of the MIP images confirms the above findings. IMPRESSION: 1. Negative for acute pulmonary embolus. 2.  Chronic interstitial lung disease with substantial progression since a 2012 high-resolution CT. Superimposed acute respiratory infection would be difficult to exclude but is not favored. Electronically Signed   By: Odessa Fleming M.D.   On: 04/02/2020 02:10   CT Chest High Resolution  Result Date: 04/03/2020 CLINICAL DATA:  Inpatient. Evaluate interstitial lung disease. Dyspnea. EXAM: CT CHEST WITHOUT CONTRAST TECHNIQUE: Multidetector CT imaging of the chest was performed following the standard protocol without intravenous contrast. High resolution imaging of the lungs, as well as inspiratory and expiratory imaging, was performed. COMPARISON:  04/02/2020 chest CT angiogram.  10/08/2010 chest CT. FINDINGS: Cardiovascular: Normal heart size. No significant pericardial effusion/thickening. Two lead left subclavian pacemaker with lead tips in the right atrium and right ventricular apex. Mildly atherosclerotic nonaneurysmal thoracic aorta. Normal caliber pulmonary arteries. Mediastinum/Nodes: No discrete thyroid nodules. Unremarkable esophagus. No pathologically enlarged axillary, mediastinal or hilar lymph nodes, noting limited sensitivity for the detection of hilar adenopathy on this noncontrast study. Lungs/Pleura: No pneumothorax. No pleural effusion. Mild centrilobular and paraseptal emphysema in the upper lobes. No acute consolidative airspace disease or lung masses. Right middle lobe solid 7 mm pulmonary nodule along the minor fissure (series 7/image 65), stable since 2012 CT, considered benign. No additional significant pulmonary nodules. Patchy confluent peripheral reticulation and peripheral lines throughout both lungs with associated mild traction bronchiectasis and architectural distortion, with regions of relative sparing  of the immediate subpleural lungs. No clear apicobasilar gradient to these findings. No frank honeycombing (mild subpleural cystic changes in the upper lobes are favored represent paraseptal  emphysema). These findings have progressed significantly since 10/08/2010 chest CT. Upper abdomen: No acute abnormality. Musculoskeletal: No aggressive appearing focal osseous lesions. Moderate thoracic spondylosis. IMPRESSION: 1. Spectrum of findings compatible with fibrotic interstitial lung disease without frank honeycombing or clear apicobasilar gradient. Findings have progressed since 2012 chest CT. The regions of immediate subpleural sparing are more suggestive of fibrotic phase nonspecific interstitial pneumonia (NSIP), although usual interstitial pneumonia (UIP) is on the differential particularly given progression. Findings are indeterminate for UIP per consensus guidelines: Diagnosis of Idiopathic Pulmonary Fibrosis: An Official ATS/ERS/JRS/ALAT Clinical Practice Guideline. Am Rosezetta Schlatter Crit Care Med Vol 198, Iss 5, [phone], May 21 2017. 2. Aortic Atherosclerosis (ICD10-I70.0) and Emphysema (ICD10-J43.9). Electronically Signed   By: Delbert Phenix M.D.   On: 04/03/2020 17:23   ECHOCARDIOGRAM COMPLETE  Result Date: 04/02/2020    ECHOCARDIOGRAM REPORT   Patient Name:   Anne Rush Date of Exam: 04/02/2020 Medical Rec #:  [id number]     Height:       63.0 in Accession #:    [id number]    Weight:       205.0 lb Date of Birth:  May 18, 1961     BSA:          1.954 m Patient Age:    59 years      BP:           117/83 mmHg Patient Gender: F             HR:           93 bpm. Exam Location:  Inpatient Procedure: 2D Echo, Cardiac Doppler and Color Doppler Indications:    Dyspnea 786.09 / R06.00  History:        Patient has prior history of Echocardiogram examinations, most                 recent 03/29/2017. Pacemaker; Risk Factors:Hypertension and                 Dyslipidemia. Cancer.  Sonographer:    Tiffany Dance Referring Phys: [id number] RONDELL A SMITH IMPRESSIONS  1. Septal-lateral dyskinesis. Left ventricular ejection fraction, by estimation, is 45 to 50%. The left ventricle has mildly decreased function. The  left ventricle demonstrates global hypokinesis. There is mild concentric left ventricular hypertrophy. Left ventricular diastolic parameters are consistent with Grade I diastolic dysfunction (impaired relaxation).  2. Right ventricular systolic function is normal. The right ventricular size is normal.  3. The mitral valve is normal in structure. No evidence of mitral valve regurgitation. No evidence of mitral stenosis.  4. The aortic valve is tricuspid. Aortic valve regurgitation is not visualized. No aortic stenosis is present.  5. The inferior vena cava is normal in size with greater than 50% respiratory variability, suggesting right atrial pressure of 3 mmHg. FINDINGS  Left Ventricle: Septal-lateral dyskinesis. Left ventricular ejection fraction, by estimation, is 45 to 50%. The left ventricle has mildly decreased function. The left ventricle demonstrates global hypokinesis. The left ventricular internal cavity size was normal in size. There is mild concentric left ventricular hypertrophy. Left ventricular diastolic parameters are consistent with Grade I diastolic dysfunction (impaired relaxation). Indeterminate filling pressures. Right Ventricle: The right ventricular size is normal. No increase in right ventricular wall thickness. Right ventricular systolic function is normal. Left Atrium: Left atrial size was normal  in size. Right Atrium: Right atrial size was normal in size. Pericardium: There is no evidence of pericardial effusion. Mitral Valve: The mitral valve is normal in structure. Normal mobility of the mitral valve leaflets. No evidence of mitral valve regurgitation. No evidence of mitral valve stenosis. Tricuspid Valve: The tricuspid valve is normal in structure. Tricuspid valve regurgitation is trivial. No evidence of tricuspid stenosis. Aortic Valve: The aortic valve is tricuspid. Aortic valve regurgitation is not visualized. No aortic stenosis is present. Pulmonic Valve: The pulmonic valve was  normal in structure. Pulmonic valve regurgitation is not visualized. No evidence of pulmonic stenosis. Aorta: The aortic root is normal in size and structure. Venous: The inferior vena cava is normal in size with greater than 50% respiratory variability, suggesting right atrial pressure of 3 mmHg. IAS/Shunts: No atrial level shunt detected by color flow Doppler. Additional Comments: A pacer wire is visualized.  LEFT VENTRICLE PLAX 2D LVIDd:         3.99 cm  Diastology LVIDs:         3.07 cm  LV e' lateral:   4.03 cm/s LV PW:         1.07 cm  LV E/e' lateral: 21.5 LV IVS:        1.15 cm  LV e' medial:    4.68 cm/s LVOT diam:     2.00 cm  LV E/e' medial:  18.5 LV SV:         47 LV SV Index:   24 LVOT Area:     3.14 cm  RIGHT VENTRICLE             IVC RV Basal diam:  2.64 cm     IVC diam: 1.98 cm RV S prime:     10.10 cm/s TAPSE (M-mode): 1.2 cm LEFT ATRIUM             Index       RIGHT ATRIUM           Index LA diam:        3.10 cm 1.59 cm/m  RA Area:     10.70 cm LA Vol (A2C):   43.2 ml 22.11 ml/m RA Volume:   21.50 ml  11.00 ml/m LA Vol (A4C):   36.4 ml 18.63 ml/m LA Biplane Vol: 39.9 ml 20.42 ml/m  AORTIC VALVE LVOT Vmax:   82.40 cm/s LVOT Vmean:  50.450 cm/s LVOT VTI:    0.150 m  AORTA Ao Root diam: 3.10 cm Ao Asc diam:  2.80 cm MITRAL VALVE MV Area (PHT): 5.13 cm    SHUNTS MV Decel Time: 148 msec    Systemic VTI:  0.15 m MV E velocity: 86.60 cm/s  Systemic Diam: 2.00 cm Chilton Si MD Electronically signed by Chilton Si MD Signature Date/Time: 04/02/2020/2:57:14 PM    Final    CUP PACEART REMOTE DEVICE CHECK  Result Date: 03/14/2020 Scheduled remote reviewed. Normal device function.  Next remote 91 days. Salomon Fick, RN, MSN   All inpatient labs reviewed  Assessment and Plan:     ICD-10-CM   1. Acute respiratory failure with hypoxia (HCC)  J96.01 CBC with Differential/Platelet  2. Acute exacerbation of idiopathic pulmonary fibrosis (HCC)  J84.112 CBC with Differential/Platelet  3.  Diastolic dysfunction  I51.89   4. Former tobacco use  Z87.891   5. Complete heart block (HCC)  I44.2   6. GAD (generalized anxiety disorder)  F41.1   7. Hypokalemia  E87.6 Basic metabolic panel   Total  encounter time: 30 minutes. This includes total time spent on the day of encounter.  Complicated medical review.  Reviewed in patient's notes, labs, imaging studies.  Consultation recommendations.  Reviewed all diagnoses and plan of care with the patient in the office.  Primary issue is her worsening pulmonary fibrosis, and she understands that she needs to keep all of her follow-ups with pulmonology and with the pulmonary fibrosis clinic.  Recheck hypokalemia -check BMP.  She did have a low potassium in the hospital.  Follow-up: Return in about 6 months (around 10/10/2020) for Physical Exam.  No orders of the defined types were placed in this encounter.  There are no discontinued medications. Orders Placed This Encounter  Procedures  . Basic metabolic panel  . CBC with Differential/Platelet    Signed,  Karleen Hampshire T. Ahmari Garton, MD   Outpatient Encounter Medications as of 04/09/2020  Medication Sig  . Ascorbic Acid (VITAMIN C) 1000 MG tablet Take 1,000 mg by mouth daily.  . carvedilol (COREG) 25 MG tablet TAKE ONE TABLET BY MOUTH TWICE A DAY WITH MEALS  . cholecalciferol (VITAMIN D3) 25 MCG (1000 UT) tablet Take 1,000 Units by mouth daily.  . diazepam (VALIUM) 5 MG tablet Take 1 tablet (5 mg total) by mouth every 8 (eight) hours as needed for anxiety.  . famotidine (PEPCID) 20 MG tablet Take 1 tablet (20 mg total) by mouth daily.  . hydrochlorothiazide (HYDRODIURIL) 12.5 MG tablet Take 1 tablet (12.5 mg total) by mouth daily.  [name] MAGNESIUM PO Take 1 tablet by mouth daily.  . nortriptyline (PAMELOR) 10 MG capsule Take 10 mg by mouth at bedtime.   . predniSONE (DELTASONE) 50 MG tablet Take 1 tablet (50 mg total) by mouth daily with breakfast for 10 days.  [name] spironolactone (ALDACTONE) 25 MG  tablet Take 1 tablet (25 mg total) by mouth daily.  . Zinc 30 MG TABS Take 30 mg by mouth daily.  Melene Muller ON 05/05/2020] predniSONE (DELTASONE) 10 MG tablet Take 3 tablets (30 mg total) by mouth daily for 14 days. (Patient not taking: Reported on 04/09/2020)  . [START ON 06/04/2020] predniSONE (DELTASONE) 10 MG tablet Take 1 tablet (10 mg total) by mouth daily. (Patient not taking: Reported on 04/09/2020)  . [START ON 04/20/2020] predniSONE (DELTASONE) 10 MG tablet Take 4 tablets (40 mg total) by mouth daily for 14 days. (Patient not taking: Reported on 04/09/2020)  . [START ON 05/20/2020] predniSONE (DELTASONE) 10 MG tablet Take 2 tablets (20 mg total) by mouth daily for 14 days. (Patient not taking: Reported on 04/09/2020)   No facility-administered encounter medications on file as of 04/09/2020.

## 2020-04-10 LAB — HYPERSENSITIVITY PNEUMONITIS
A. Pullulans Abs: NEGATIVE
A.Fumigatus #1 Abs: NEGATIVE
Micropolyspora faeni, IgG: NEGATIVE
Pigeon Serum Abs: NEGATIVE
Thermoact. Saccharii: NEGATIVE
Thermoactinomyces vulgaris, IgG: NEGATIVE

## 2020-04-16 ENCOUNTER — Encounter: Payer: BC Managed Care – PPO | Admitting: Family Medicine

## 2020-04-18 ENCOUNTER — Encounter: Payer: Self-pay | Admitting: Pulmonary Disease

## 2020-04-18 ENCOUNTER — Ambulatory Visit: Payer: BC Managed Care – PPO | Admitting: Pulmonary Disease

## 2020-04-18 ENCOUNTER — Other Ambulatory Visit: Payer: Self-pay

## 2020-04-18 VITALS — BP 118/68 | HR 86 | Temp 98.5°F | Ht 63.0 in | Wt 200.2 lb

## 2020-04-18 DIAGNOSIS — J849 Interstitial pulmonary disease, unspecified: Secondary | ICD-10-CM | POA: Diagnosis not present

## 2020-04-18 DIAGNOSIS — J84112 Idiopathic pulmonary fibrosis: Secondary | ICD-10-CM | POA: Diagnosis not present

## 2020-04-18 DIAGNOSIS — I442 Atrioventricular block, complete: Secondary | ICD-10-CM

## 2020-04-18 MED ORDER — BENZONATATE 200 MG PO CAPS: 200.0000 mg | ORAL_CAPSULE | Freq: Three times a day (TID) | ORAL | 1 refills | Status: DC | PRN

## 2020-04-18 NOTE — Patient Instructions (Addendum)
You were seen today by Coral Ceo, NP  for:   1. ILD (interstitial lung disease) (HCC)  - benzonatate (TESSALON) 200 MG capsule; Take 1 capsule (200 mg total) by mouth 3 (three) times daily as needed for cough.  Dispense: 30 capsule; Refill: 1  Continue prednisone as outlined and instructed from the hospital  Walk today in office stable, 3 laps without any oxygen desaturations  Note provided from work that Dr. Marchelle Gearing does not believe that you should return to work at this time  2. Acute exacerbation of idiopathic pulmonary fibrosis (HCC)  Continue prednisone taper as outlined and instructed from the hospital  Walk today in office stable without any oxygen desaturations  3. Complete heart block [facility])  Established with cardiology next month   We recommend today:   Meds ordered this encounter  Medications  . benzonatate (TESSALON) 200 MG capsule    Sig: Take 1 capsule (200 mg total) by mouth 3 (three) times daily as needed for cough.    Dispense:  30 capsule    Refill:  1    Follow Up:    Return in about 2 months (around 06/19/2020), or if symptoms worsen or fail to improve, for Follow up with Dr. Dalbert Mayotte, ILD clinic - slot.   Please do your part to reduce the spread of COVID-19:      Reduce your risk of any infection  and COVID19 by using the similar precautions used for avoiding the common cold or flu:  [name] Wash your hands often with soap and warm water for at least 20 seconds.  If soap and water are not readily available, use an alcohol-based hand sanitizer with at least 60% alcohol.  . If coughing or sneezing, cover your mouth and nose by coughing or sneezing into the elbow areas of your shirt or coat, into a tissue or into your sleeve (not your hands). Drinda Butts A MASK when in public  . Avoid shaking hands with others and consider head nods or verbal greetings only. . Avoid touching your eyes, nose, or mouth with unwashed hands.  . Avoid close contact with  people who are sick. . Avoid places or events with large numbers of people in one location, like concerts or sporting events. . If you have some symptoms but not all symptoms, continue to monitor at home and seek medical attention if your symptoms worsen. . If you are having a medical emergency, call 911.   ADDITIONAL HEALTHCARE OPTIONS FOR PATIENTS  [place] [facility] / e-Visit: [email or web address]         [facility]: [phone]  [facility]: [phone]                   [facility]: [phone]     It is flu season:   >>> Best ways to protect herself from the flu: Receive the yearly flu vaccine, practice good hand hygiene washing with soap and also using hand sanitizer when available, eat a nutritious meals, get adequate rest, hydrate appropriately   Please contact the office if your symptoms worsen or you have concerns that you are not improving.   Thank you for choosing [facility] for your healthcare, and for allowing [place] to partner with you on your healthcare journey. I am thankful to be able to provide care to you today.   Elisha Headland FNP-C

## 2020-04-18 NOTE — Assessment & Plan Note (Signed)
She is status post pacemaker implantation during hospital admission in June/2017 per documentation  Plan: Patient reports that she has upcoming appointment with cardiology next month

## 2020-04-18 NOTE — Progress Notes (Signed)
  ID: Anne Rush, female    DOB: 1961/03/03, 59 y.o.   MRN: [id number]  Chief Complaint  Patient presents with   Follow-up    pt was having sob when doing activities currently on 2 liters of oxygen    Referring provider: [name], MD  HPI:  59 year old female former smoker followed in our office for interstitial lung disease  PMH: Hyperlipidemia, hypertension, anxiety Smoker/ Smoking History: Former smoker Maintenance: Prednisone 50 mg Pt of: Dr. Marchelle Gearing  04/18/2020  - Visit   59 year old female former smoker presenting to office today as a hospital follow-up.  Patient was consulted with our practice on 04/02/2020.  Concerns at that consult initially on 04/02/2020 is that patient may actually have more IPAP with more NSIP pattern.  CT was showing areas of groundglass opacities.  Patient was started on steroids as well as resending labs for autoimmune work-up. Patient was then consulted with Dr. Marchelle Gearing on 04/03/2020.  She was previously seen back in 2012 and found to have RB ILD.  Patient presenting to office today on 2 L, doing well since being discharged from the hospital.  She is currently on 50 mg of prednisone daily.  She plans to taper down to 40 mg as plan on 1 August/2021.  Patient reports that she has been doing okay since getting home from the hospital.  She admits that sometimes she takes her oxygen off and does not wear it for sometimes up to an hour at a time.  She checks her oxygen levels but she is stationary when doing this and her oxygen levels are typically above 88%.  We will evaluate this with a walk today  Patient feels that she is been doing better since getting higher quality sleep.  She reports that taking magnesium has helped her with her management of sleep.   Patient reports that she has an upcoming appointment to establish care with Dr. Bufford Buttner with cardiology.  She is hoping to qualify for a portable oxygen concentrator.  She does  occasionally have a cough.  She is wondering if she it would be okay for her to take Mucinex.  She admits that most the time her cough is dry.  Questionaires / Pulmonary Flowsheets:   ACT:  No flowsheet data found.  MMRC: No flowsheet data found.  Epworth:  No flowsheet data found.  Tests:   04/02/2020-CTA chest-negative for acute PE, chronic interstitial lung disease was substantial progression since 2012 high-resolution CT, superimposed acute respiratory infection would be difficult to exclude but is not favored  04/03/2020-CT chest high-res-spectrum of findings compatible with fibrotic interstitial lung disease without frank honeycombing, or clear apical bicycle gradient, findings have progressed since 2012 chest CT, regions of immediate subpleural sparing are more suggestive of fibrotic phase of NSIP, although unusual interstitial pneumonia or UIP is on the differential particularly given the progression  04/05/2020-hypersensitivity pneumonitis-negative 04/02/2020-sed rate-8 04/02/2020-see reactive protein-1.7 04/02/2020-LDH-232 04/02/2020-aldolase-6.7 04/02/2020-rheumatoid factor-less than 10 04/02/2020-CCP-4 04/02/2020-ANCA antibodies-negative  04/04/2020-pulmonary function tests-FVC 2.25 (69% predicted), ratio 85, FEV1 1.92 (75% predicted), DLCO 14.04 (71% predicted)   04/04/20- [place] Integrated Comprehensive ILD Questionnaire  Symptoms: Recent symptoms started suddenly.  Since it started 1 month ago is getting worse.  There is episodic dyspnea.  Severity is listed below.  She also has a cough that she reports specifically started on Monday, March 31, 2020.  Since it started and with steroids it is better.  It is mild in intensity.  She does bring up some phlegm that  is green.  A separate brown speckles.  There is no hemoptysis.  Cough does not get worse when she lies down.  Does not affect her voice.  Does not clear her throat.  No tickle.  All the symptoms started approximately 5  weeks before admission.  SYMPTOM SCALE - ILD 04/04/2020   O2 use RA - 87% in hospital  Shortness of Breath 0 -> 5 scale with 5 being worst (score 6 If unable to do)  At rest 2  Simple tasks - showers, clothes change, eating, shaving 2  Household (dishes, doing bed, laundry) 5  Shopping 5  Walking level at own pace 4  Walking up Stairs 5  Total (30-36) Dyspnea Score 23  How bad is your cough? x  How bad is your fatigue x  How bad is nausea x  How bad is vomiting?  x  How bad is diarrhea? x  How bad is anxiety? x  How bad is depression x       Past Medical History : Positive for heart failure.  Third-degree heart block she has a pacer.  This has been going on for several years.  Currently ejection fraction reported as slightly low normal but according to Dr. Rito Ehrlich and the hospitalist cardiology reviewed the echo again and feel the ejection fraction is normal.  Current BNP is normal.  She has obesity.  Otherwise negative past medical history.  No COPD or asthma.  No collagen vascular disease no scleroderma no lupus no polymyositis.  Autoimmune panel is all negative.  No stroke or thyroid disease.  No hepatitis.  No history of blood clot.   ROS: Positive for dyspnea.  Was stable dyspnea all the way from 2012 up until recently 5 weeks ago.  There is associated with fatigue and arthralgia.  No dysphagia.  No acid reflux.  No vomiting no nausea.   FAMILY HISTORY of LUNG DISEASE: Positive for COPD in the diet and asthma in the past.  No history of pulmonary fibrosis.  No cystic fibrosis denies hypersensitive pneumonitis no autoimmune disease.   EXPOSURE HISTORY:  -She did smoke cigarettes between 1979 and 2012.  She quit smoking in 2012 when her interstitial lung disease was diagnosed.  She smoked around 10 cigarettes a day.  No cigars.  No passive smoking.  No vaping.  No marijuana use.  No cocaine use.  No intravenous drug use.   HOME and HOBBY DETAILS : [place]  Is a single-family home is also 59 years of age.  In the house there is no dampness.  There is no mold or mildew in the shower curtain.  There is no mold or mildew anywhere in the house.  There is no humidifier in the house no CPAP use in the house.  No nebulizer machine.  No steam iron.  No Jacuzzi.  No misting [place] in the house.  No pet birds or parakeets.  No pet gerbils.  No feather pillows or blankets.  No music habits.  No gardening habits.  She does throw prone to the deer but there is no mold in the:   OCCUPATIONAL HISTORY (122 questions) : There is significant water damage or mold exposure at work.  She works for [employer] since 2016.  Prior to this she was not working any atmosphere that had dampness of mold.  She states the building is a very old building.  She says that there has been significant water damage and water leak.  Water has port in into the several rooms in the office.  She says there is mold in the hallways.  There is mold in the break room.  There is mold in the bosses office.  She is not sure but she thinks there is mold even in her cubicle.  Is been going on for several years but increased in the last few years.  She says on her desk every morning there are black spots but she does not know what it is.  Her husband showed pictures of the office of the right leg significant dampness and rotting of the walls and floor.  She works as a Catering manager.  It is just her and her boss.  Rest of the questionnaire is negative.   PULMONARY TOXICITY HISTORY (27 items): Never been on urinary antibiotic Macrodantin.  Currently on prednisone.    FENO:  No results found for: NITRICOXIDE  PFT: PFT Results Latest Ref Rng & Units 04/04/2020  FVC-Pre L 2.25  FVC-Predicted Pre % 69  Pre FEV1/FVC % % 85  FEV1-Pre L 1.92  FEV1-Predicted Pre % 75  DLCO uncorrected ml/min/mmHg 14.04  DLCO UNC% % 71  DLCO  corrected ml/min/mmHg 13.23  DLCO COR %Predicted % 66  DLVA Predicted % 97    WALK:  No flowsheet data found.  Imaging: DG Chest 2 View  Result Date: 04/01/2020 CLINICAL DATA:  Chest pain, shortness of breath EXAM: CHEST - 2 VIEW COMPARISON:  07/04/2019 FINDINGS: Left-sided implanted cardiac device. The heart size and mediastinal contours are within normal limits. Chronically coarsened bilateral interstitial markings, similar in appearance to prior. No definite superimposed airspace opacity. No pleural effusion or pneumothorax. The visualized skeletal structures are unremarkable. IMPRESSION: Chronic interstitial lung changes. No definite superimposed acute cardiopulmonary process. Electronically Signed   By: Duanne Guess D.O.   On: 04/01/2020 15:24   CT Angio Chest PE W and/or Wo Contrast  Result Date: 04/02/2020 CLINICAL DATA:  59 year old female with shortness of breath and chest tightness for 8 days. EXAM: CT ANGIOGRAPHY CHEST WITH CONTRAST TECHNIQUE: Multidetector CT imaging of the chest was performed using the standard protocol during bolus administration of intravenous contrast. Multiplanar CT image reconstructions and MIPs were obtained to evaluate the vascular anatomy. CONTRAST:  55mL OMNIPAQUE IOHEXOL 350 MG/ML SOLN COMPARISON:  Chest radiographs yesterday. High-resolution chest CT 10/08/2010. FINDINGS: Cardiovascular: Good contrast bolus timing in the pulmonary arterial tree. No focal filling defect identified in the pulmonary arteries to suggest acute pulmonary embolism. Left chest cardiac pacemaker device. Cardiac size at the upper limits of normal. No pericardial effusion. Negative visible aorta aside from mild atherosclerosis. No calcified coronary artery atherosclerosis is evident. Mediastinum/Nodes: Negative.  No lymphadenopathy. Lungs/Pleura: Major airways are patent. There is generalized pulmonary ground-glass opacity with bilateral increased subpleural opacity and upper lobe  architectural distortion perhaps due to honeycombing (series 6, image 34). Very early similar changes were present on a 2012 chest CT where UIP was favored. Peripheral and dependent patchy opacity at both lung bases. No pleural effusion. No consolidation. Upper Abdomen: Small chronic rim calcified nodule in the gastrohepatic ligament is most compatible with the chronic postinflammatory lymph node, mildly increased in conspicuity since 2012. Otherwise negative visible upper abdomen including the visible liver, gallbladder, spleen, adrenal glands. Musculoskeletal: No acute osseous abnormality identified. Incomplete segmentation of T2-T3. Review of the MIP images confirms the above findings. IMPRESSION: 1. Negative for acute pulmonary embolus. 2. Chronic  interstitial lung disease with substantial progression since a 2012 high-resolution CT. Superimposed acute respiratory infection would be difficult to exclude but is not favored. Electronically Signed   By: Odessa Fleming M.D.   On: 04/02/2020 02:10   CT Chest High Resolution  Result Date: 04/03/2020 CLINICAL DATA:  Inpatient. Evaluate interstitial lung disease. Dyspnea. EXAM: CT CHEST WITHOUT CONTRAST TECHNIQUE: Multidetector CT imaging of the chest was performed following the standard protocol without intravenous contrast. High resolution imaging of the lungs, as well as inspiratory and expiratory imaging, was performed. COMPARISON:  04/02/2020 chest CT angiogram.  10/08/2010 chest CT. FINDINGS: Cardiovascular: Normal heart size. No significant pericardial effusion/thickening. Two lead left subclavian pacemaker with lead tips in the right atrium and right ventricular apex. Mildly atherosclerotic nonaneurysmal thoracic aorta. Normal caliber pulmonary arteries. Mediastinum/Nodes: No discrete thyroid nodules. Unremarkable esophagus. No pathologically enlarged axillary, mediastinal or hilar lymph nodes, noting limited sensitivity for the detection of hilar adenopathy on  this noncontrast study. Lungs/Pleura: No pneumothorax. No pleural effusion. Mild centrilobular and paraseptal emphysema in the upper lobes. No acute consolidative airspace disease or lung masses. Right middle lobe solid 7 mm pulmonary nodule along the minor fissure (series 7/image 65), stable since 2012 CT, considered benign. No additional significant pulmonary nodules. Patchy confluent peripheral reticulation and peripheral lines throughout both lungs with associated mild traction bronchiectasis and architectural distortion, with regions of relative sparing of the immediate subpleural lungs. No clear apicobasilar gradient to these findings. No frank honeycombing (mild subpleural cystic changes in the upper lobes are favored represent paraseptal emphysema). These findings have progressed significantly since 10/08/2010 chest CT. Upper abdomen: No acute abnormality. Musculoskeletal: No aggressive appearing focal osseous lesions. Moderate thoracic spondylosis. IMPRESSION: 1. Spectrum of findings compatible with fibrotic interstitial lung disease without frank honeycombing or clear apicobasilar gradient. Findings have progressed since 2012 chest CT. The regions of immediate subpleural sparing are more suggestive of fibrotic phase nonspecific interstitial pneumonia (NSIP), although usual interstitial pneumonia (UIP) is on the differential particularly given progression. Findings are indeterminate for UIP per consensus guidelines: Diagnosis of Idiopathic Pulmonary Fibrosis: An Official ATS/ERS/JRS/ALAT Clinical Practice Guideline. Am Rosezetta Schlatter Crit Care Med Vol 198, Iss 5, [phone], May 21 2017. 2. Aortic Atherosclerosis (ICD10-I70.0) and Emphysema (ICD10-J43.9). Electronically Signed   By: Delbert Phenix M.D.   On: 04/03/2020 17:23   ECHOCARDIOGRAM COMPLETE  Result Date: 04/02/2020    ECHOCARDIOGRAM REPORT   Patient Name:   JOZETTE CASTRELLON Date of Exam: 04/02/2020 Medical Rec #:  [id number]     Height:       63.0 in  Accession #:    [id number]    Weight:       205.0 lb Date of Birth:  August 07, 1961     BSA:          1.954 m Patient Age:    59 years      BP:           117/83 mmHg Patient Gender: F             HR:           93 bpm. Exam Location:  Inpatient Procedure: 2D Echo, Cardiac Doppler and Color Doppler Indications:    Dyspnea 786.09 / R06.00  History:        Patient has prior history of Echocardiogram examinations, most                 recent 03/29/2017. Pacemaker; Risk Factors:Hypertension and  Dyslipidemia. Cancer.  Sonographer:    Tiffany Dance Referring Phys: [id number] RONDELL A SMITH IMPRESSIONS  1. Septal-lateral dyskinesis. Left ventricular ejection fraction, by estimation, is 45 to 50%. The left ventricle has mildly decreased function. The left ventricle demonstrates global hypokinesis. There is mild concentric left ventricular hypertrophy. Left ventricular diastolic parameters are consistent with Grade I diastolic dysfunction (impaired relaxation).  2. Right ventricular systolic function is normal. The right ventricular size is normal.  3. The mitral valve is normal in structure. No evidence of mitral valve regurgitation. No evidence of mitral stenosis.  4. The aortic valve is tricuspid. Aortic valve regurgitation is not visualized. No aortic stenosis is present.  5. The inferior vena cava is normal in size with greater than 50% respiratory variability, suggesting right atrial pressure of 3 mmHg. FINDINGS  Left Ventricle: Septal-lateral dyskinesis. Left ventricular ejection fraction, by estimation, is 45 to 50%. The left ventricle has mildly decreased function. The left ventricle demonstrates global hypokinesis. The left ventricular internal cavity size was normal in size. There is mild concentric left ventricular hypertrophy. Left ventricular diastolic parameters are consistent with Grade I diastolic dysfunction (impaired relaxation). Indeterminate filling pressures. Right Ventricle: The right  ventricular size is normal. No increase in right ventricular wall thickness. Right ventricular systolic function is normal. Left Atrium: Left atrial size was normal in size. Right Atrium: Right atrial size was normal in size. Pericardium: There is no evidence of pericardial effusion. Mitral Valve: The mitral valve is normal in structure. Normal mobility of the mitral valve leaflets. No evidence of mitral valve regurgitation. No evidence of mitral valve stenosis. Tricuspid Valve: The tricuspid valve is normal in structure. Tricuspid valve regurgitation is trivial. No evidence of tricuspid stenosis. Aortic Valve: The aortic valve is tricuspid. Aortic valve regurgitation is not visualized. No aortic stenosis is present. Pulmonic Valve: The pulmonic valve was normal in structure. Pulmonic valve regurgitation is not visualized. No evidence of pulmonic stenosis. Aorta: The aortic root is normal in size and structure. Venous: The inferior vena cava is normal in size with greater than 50% respiratory variability, suggesting right atrial pressure of 3 mmHg. IAS/Shunts: No atrial level shunt detected by color flow Doppler. Additional Comments: A pacer wire is visualized.  LEFT VENTRICLE PLAX 2D LVIDd:         3.99 cm  Diastology LVIDs:         3.07 cm  LV e' lateral:   4.03 cm/s LV PW:         1.07 cm  LV E/e' lateral: 21.5 LV IVS:        1.15 cm  LV e' medial:    4.68 cm/s LVOT diam:     2.00 cm  LV E/e' medial:  18.5 LV SV:         47 LV SV Index:   24 LVOT Area:     3.14 cm  RIGHT VENTRICLE             IVC RV Basal diam:  2.64 cm     IVC diam: 1.98 cm RV S prime:     10.10 cm/s TAPSE (M-mode): 1.2 cm LEFT ATRIUM             Index       RIGHT ATRIUM           Index LA diam:        3.10 cm 1.59 cm/m  RA Area:     10.70 cm LA Vol (A2C):   43.2  ml 22.11 ml/m RA Volume:   21.50 ml  11.00 ml/m LA Vol (A4C):   36.4 ml 18.63 ml/m LA Biplane Vol: 39.9 ml 20.42 ml/m  AORTIC VALVE LVOT Vmax:   82.40 cm/s LVOT Vmean:  50.450  cm/s LVOT VTI:    0.150 m  AORTA Ao Root diam: 3.10 cm Ao Asc diam:  2.80 cm MITRAL VALVE MV Area (PHT): 5.13 cm    SHUNTS MV Decel Time: 148 msec    Systemic VTI:  0.15 m MV E velocity: 86.60 cm/s  Systemic Diam: 2.00 cm Chilton Si MD Electronically signed by Chilton Si MD Signature Date/Time: 04/02/2020/2:57:14 PM    Final     Lab Results:  CBC    Component Value Date/Time   WBC 9.8 04/09/2020 1112   RBC 4.87 04/09/2020 1112   HGB 15.4 (H) 04/09/2020 1112   HCT 44.8 04/09/2020 1112   PLT 252.0 04/09/2020 1112   MCV 92.0 04/09/2020 1112   MCH 30.8 04/05/2020 1059   MCHC 34.4 04/09/2020 1112   RDW 12.8 04/09/2020 1112   LYMPHSABS 1.9 04/09/2020 1112   MONOABS 0.5 04/09/2020 1112   EOSABS 0.0 04/09/2020 1112   BASOSABS 0.0 04/09/2020 1112    BMET    Component Value Date/Time   NA 137 04/09/2020 1112   K 4.4 04/09/2020 1112   CL 99 04/09/2020 1112   CO2 28 04/09/2020 1112   GLUCOSE 134 (H) 04/09/2020 1112   BUN 21 04/09/2020 1112   CREATININE 0.78 04/09/2020 1112   CALCIUM 9.8 04/09/2020 1112   GFRNONAA >60 04/05/2020 1059   GFRAA >60 04/05/2020 1059    BNP    Component Value Date/Time   BNP 21.9 04/02/2020 1857    ProBNP No results found for: PROBNP  Specialty Problems      Pulmonary Problems   ALLERGIC RHINITIS    Qualifier: Diagnosis of  By: Alphonsus Sias MD, Ronnette Hila       Postinflammatory pulmonary fibrosis (HCC)    Qualifier: Diagnosis of  By: Patsy Lager MD, Spencer        Acute exacerbation of idiopathic pulmonary fibrosis (HCC)   Acute respiratory failure with hypoxia (HCC)   ILD (interstitial lung disease) (HCC)      Allergies  Allergen Reactions   Codeine Hives and Itching   Losartan Potassium Other (See Comments)   Neomycin-Polymyxin-Dexameth Itching, Swelling and Other (See Comments)    Swelling and itching of eye    Oxycodone-Acetaminophen Other (See Comments)    REACTION: unspecified   Paroxetine Other (See Comments)     unknown   Sulfonamide Derivatives Other (See Comments)    REACTION: unspecified   Lisinopril Rash   Sulfa Antibiotics Hives and Rash         Immunization History  Administered Date(s) Administered   Influenza Nasal 06/20/2017   Influenza Whole 07/04/2008, 06/18/2009, 08/27/2010   Influenza, Seasonal, Injecte, Preservative Fre 09/03/2010, 06/22/2011, 06/27/2012   Influenza,inj,Quad PF,6+ Mos 07/21/2015, 07/06/2018   Influenza-Unspecified 06/27/2012, 07/17/2014, 07/19/2016, 07/21/2019   Td 05/10/2002   Tdap 10/28/2014    Past Medical History:  Diagnosis Date   Anxiety    Family history of ovarian cancer    Hyperlipidemia    Hypertension    Interstitial lung disease (HCC)    Pollen allergies    Squamous cell carcinoma 2014   skin cancer   Subclinical hyperthyroidism 06/17/2014    Tobacco History: Social History   Tobacco Use  Smoking Status Former Smoker  Smokeless Tobacco Never Used  Counseling given: Not Answered   Continue to not smoke  Outpatient Encounter Medications as of 04/18/2020  Medication Sig   Ascorbic Acid (VITAMIN C) 1000 MG tablet Take 1,000 mg by mouth daily.   carvedilol (COREG) 25 MG tablet TAKE ONE TABLET BY MOUTH TWICE A DAY WITH MEALS   cholecalciferol (VITAMIN D3) 25 MCG (1000 UT) tablet Take 1,000 Units by mouth daily.   diazepam (VALIUM) 5 MG tablet Take 1 tablet (5 mg total) by mouth every 8 (eight) hours as needed for anxiety.   famotidine (PEPCID) 20 MG tablet Take 1 tablet (20 mg total) by mouth daily.   hydrochlorothiazide (HYDRODIURIL) 12.5 MG tablet Take 1 tablet (12.5 mg total) by mouth daily.   MAGNESIUM PO Take 1 tablet by mouth daily.   predniSONE (DELTASONE) 50 MG tablet Take 1 tablet (50 mg total) by mouth daily with breakfast for 10 days. (Patient taking differently: Take 50 mg by mouth daily with breakfast. Start 40 mg on august 1st)   spironolactone (ALDACTONE) 25 MG tablet Take 1 tablet (25 mg  total) by mouth daily.   Zinc 30 MG TABS Take 30 mg by mouth daily.   benzonatate (TESSALON) 200 MG capsule Take 1 capsule (200 mg total) by mouth 3 (three) times daily as needed for cough.   [DISCONTINUED] nortriptyline (PAMELOR) 10 MG capsule Take 10 mg by mouth at bedtime.    [DISCONTINUED] predniSONE (DELTASONE) 10 MG tablet Take 3 tablets (30 mg total) by mouth daily for 14 days. (Patient not taking: Reported on 04/09/2020)   [DISCONTINUED] predniSONE (DELTASONE) 10 MG tablet Take 1 tablet (10 mg total) by mouth daily. (Patient not taking: Reported on 04/09/2020)   [DISCONTINUED] predniSONE (DELTASONE) 10 MG tablet Take 4 tablets (40 mg total) by mouth daily for 14 days. (Patient not taking: Reported on 04/09/2020)   [DISCONTINUED] predniSONE (DELTASONE) 10 MG tablet Take 2 tablets (20 mg total) by mouth daily for 14 days. (Patient not taking: Reported on 04/09/2020)   No facility-administered encounter medications on file as of 04/18/2020.     Review of Systems  Review of Systems  Constitutional: Positive for fatigue. Negative for activity change and fever.  HENT: Negative for sinus pressure, sinus pain and sore throat.   Respiratory: Positive for cough and shortness of breath. Negative for wheezing.   Cardiovascular: Negative for chest pain and palpitations.  Gastrointestinal: Negative for diarrhea, nausea and vomiting.  Musculoskeletal: Negative for arthralgias.  Neurological: Negative for dizziness.  Psychiatric/Behavioral: Negative for sleep disturbance. The patient is not nervous/anxious.      Physical Exam  BP 118/68 (BP Location: Left Arm, Cuff Size: Normal)    Pulse 86    Temp 98.5 F (36.9 C) (Oral)    Ht 5\' 3"  (1.6 m)    Wt 200 lb 3.2 oz (90.8 kg)    SpO2 98%    BMI 35.46 kg/m   Wt Readings from Last 5 Encounters:  04/18/20 200 lb 3.2 oz (90.8 kg)  04/09/20 202 lb 4 oz (91.7 kg)  04/05/20 198 lb 1.6 oz (89.9 kg)  02/26/20 204 lb (92.5 kg)  11/19/19 207 lb 12.8  oz (94.3 kg)    BMI Readings from Last 5 Encounters:  04/18/20 35.46 kg/m  04/09/20 35.83 kg/m  04/05/20 35.09 kg/m  02/26/20 36.14 kg/m  11/19/19 37.10 kg/m     Physical Exam Vitals and nursing note reviewed.  Constitutional:      General: She is not in acute distress.    Appearance:  Normal appearance. She is obese.  HENT:     Head: Normocephalic and atraumatic.     Right Ear: External ear normal.     Left Ear: External ear normal.     Mouth/Throat:     Mouth: Mucous membranes are moist.     Pharynx: Oropharynx is clear.  Eyes:     Pupils: Pupils are equal, round, and reactive to light.  Cardiovascular:     Rate and Rhythm: Normal rate and regular rhythm.     Pulses: Normal pulses.     Heart sounds: Normal heart sounds. No murmur heard.   Pulmonary:     Effort: Pulmonary effort is normal. No respiratory distress.     Breath sounds: Normal breath sounds. No decreased air movement. No decreased breath sounds, wheezing or rales.  Musculoskeletal:     Cervical back: Normal range of motion.  Skin:    General: Skin is warm and dry.     Capillary Refill: Capillary refill takes less than 2 seconds.  Neurological:     General: No focal deficit present.     Mental Status: She is alert and oriented to person, place, and time. Mental status is at baseline.     Gait: Gait normal.  Psychiatric:        Mood and Affect: Mood normal.        Behavior: Behavior normal.        Thought Content: Thought content normal.        Judgment: Judgment normal.       Assessment & Plan:   Complete heart block (HCC) She is status post pacemaker implantation during hospital admission in June/2017 per documentation  Plan: Patient reports that she has upcoming appointment with cardiology next month  Acute exacerbation of idiopathic pulmonary fibrosis (HCC) Plan: Continue prednisone Walk today in office -stable 3 laps without any oxygen desaturations   ILD (interstitial lung  disease) (HCC) Progression since 2012 on CT imaging Favoring NSIP, IPAF, mold exposures  Plan: Continue prednisone Titrate down to 40 mg of prednisone daily as instructed on 04/20/2020 Walk today in office Work note provided as it is suspected that mold/fungus exposures at work may have flared patient symptoms     Return in about 2 months (around 06/19/2020), or if symptoms worsen or fail to improve, for Follow up with Dr. Dalbert Mayotte, ILD clinic - slot.   Coral Ceo, NP 04/18/2020   This appointment required 32 minutes of patient care (this includes precharting, chart review, review of results, face-to-face care, etc.).

## 2020-04-18 NOTE — Assessment & Plan Note (Signed)
Progression since 2012 on CT imaging Favoring NSIP, IPAF, mold exposures  Plan: Continue prednisone Titrate down to 40 mg of prednisone daily as instructed on 04/20/2020 Walk today in office Work note provided as it is suspected that mold/fungus exposures at work may have flared patient symptoms

## 2020-04-18 NOTE — Assessment & Plan Note (Addendum)
Plan: Continue prednisone Walk today in office -stable 3 laps without any oxygen desaturations

## 2020-04-30 ENCOUNTER — Encounter: Payer: BC Managed Care – PPO | Admitting: Family Medicine

## 2020-05-06 ENCOUNTER — Telehealth: Payer: Self-pay | Admitting: Cardiovascular Disease

## 2020-05-06 DIAGNOSIS — I5189 Other ill-defined heart diseases: Secondary | ICD-10-CM | POA: Diagnosis not present

## 2020-05-06 DIAGNOSIS — J9601 Acute respiratory failure with hypoxia: Secondary | ICD-10-CM | POA: Diagnosis not present

## 2020-05-06 NOTE — Telephone Encounter (Signed)
lmtcb  Ok to change to VV per MD

## 2020-05-06 NOTE — Telephone Encounter (Signed)
That is fine. -W 

## 2020-05-06 NOTE — Telephone Encounter (Signed)
Patient is calling regarding her in-OV for 8/26 with Dr. Flora Lipps. She states that she has bad fibrosis and her lungs are still healing so she absolutely can not get sick and is wondering if she can do virtual for her appointment on 8/26 instead of coming into the office. She states she has to carry around a huge oxygen concentrator so it would be really hard for her to come anyway's. Please advise.

## 2020-05-11 NOTE — Progress Notes (Signed)
Virtual Visit via Video Note   This visit type was conducted due to national recommendations for restrictions regarding the COVID-19 Pandemic (e.g. social distancing) in an effort to limit this patient's exposure and mitigate transmission in our community.  Due to her co-morbid illnesses, this patient is at least at moderate risk for complications without adequate follow up.  This format is felt to be most appropriate for this patient at this time.  All issues noted in this document were discussed and addressed.  A limited physical exam was performed with this format.  Please refer to the patient's chart for her consent to telehealth for [facility].  Date:  05/13/2020   ID:  Anne Rush, DOB Feb 14, 1961, MRN [id number] The patient was identified using 2 identifiers.  Patient Location: Home Provider Location: Office/Clinic  PCP:  [name], MD  Cardiologist:  [name], MD  Electrophysiologist:  Regan Lemming, MD   Evaluation Performed:  Follow-Up Visit  Chief Complaint:  Follow-up.  History of Present Illness:    Anne Rush is a 59 y.o. female with ILD, HTN, CHB s/p ppm who presents for follow-up. EF 45-50%.  She was seen in the hospital several months ago for chest tightness.  This was in the setting of interstitial lung disease exacerbation.  She has idiopathic pulmonary fibrosis.  She is still on a steroid taper reports her symptoms of shortness of breath and chest tightness have resolved.  She reports that heavy exertion does get her quite out of breath but she has no more of the symptoms she had in the hospital.  She is had no fluid retention reported with the steroids.  We did discuss that her heart function is low normal and she is prone to fluid retention.  She seems to be doing well with this.  She has noticed puffiness in her cheeks.  She does report that her heart rate will increase in the 100s with exertion and I informed her that this is normal.  She  also had questions about taking Valium with sleep.  I informed her that her heart is okay for this.  We did discuss her recent lipid profile which shows elevated lipids.  She is modified her diet and apparently blood pressure has improved.  She will have repeat lipid profile performed by her primary care physician.  She overall appears to be doing well and denies any chest pain, shortness of breath or palpitations today.    Problem List 1. CHB s/p ppm 2017 -EF 45-50% with abnormal septal motion due to ppm 2. HTN 3. ILD 4.  Hyperlipidemia -Total cholesterol 226, HDL 48, LDL 160, triglycerides 273  The patient does not have symptoms concerning for COVID-19 infection (fever, chills, cough, or new shortness of breath).    Past Medical History:  Diagnosis Date   Anxiety    Family history of ovarian cancer    Hyperlipidemia    Hypertension    Interstitial lung disease (HCC)    Pollen allergies    Squamous cell carcinoma 2014   skin cancer   Subclinical hyperthyroidism 06/17/2014   Past Surgical History:  Procedure Laterality Date   COSMETIC SURGERY     EP IMPLANTABLE DEVICE N/A 02/26/2016   Procedure: Pacemaker Implant;  Surgeon: Will Jorja Loa, MD;  Location: [facility] INVASIVE CV LAB;  Service: Cardiovascular;  Laterality: N/A;   TUBAL LIGATION       Current Meds  Medication Sig   Ascorbic Acid (VITAMIN C) 1000  MG tablet Take 1,000 mg by mouth daily.   carvedilol (COREG) 25 MG tablet TAKE ONE TABLET BY MOUTH TWICE A DAY WITH MEALS   famotidine (PEPCID) 20 MG tablet Take 1 tablet (20 mg total) by mouth daily.   hydrochlorothiazide (HYDRODIURIL) 12.5 MG tablet Take 1 tablet (12.5 mg total) by mouth daily.   MAGNESIUM PO Take 1 tablet by mouth daily.   PREDNISONE PO Take by mouth. Taking 30 mg for 14 days, then 20 mg for 14 days, and then 10 mg for 14 days.   spironolactone (ALDACTONE) 25 MG tablet Take 1 tablet (25 mg total) by mouth daily.   Zinc 30 MG TABS Take  30 mg by mouth daily.     Allergies:   Codeine, Losartan potassium, Neomycin-polymyxin-dexameth, Oxycodone-acetaminophen, Paroxetine, Sulfonamide derivatives, Lisinopril, and Sulfa antibiotics   Social History   Tobacco Use   Smoking status: Former Smoker   Smokeless tobacco: Never Used  Substance Use Topics   Alcohol use: No   Drug use: No     Family Hx: The patient's family history includes Anxiety disorder in her mother; Cancer in her maternal aunt; Heart failure in her maternal grandfather; Hypertension in her mother; Ovarian cancer (age of onset: 51) in her mother.  ROS:   Please see the history of present illness.     All other systems reviewed and are negative.   Prior CV studies:   The following studies were reviewed today:  TTE 04/02/2020 1. Septal-lateral dyskinesis. Left ventricular ejection fraction, by  estimation, is 45 to 50%. The left ventricle has mildly decreased  function. The left ventricle demonstrates global hypokinesis. There is  mild concentric left ventricular hypertrophy.  Left ventricular diastolic parameters are consistent with Grade I  diastolic dysfunction (impaired relaxation).  2. Right ventricular systolic function is normal. The right ventricular  size is normal.  3. The mitral valve is normal in structure. No evidence of mitral valve  regurgitation. No evidence of mitral stenosis.  4. The aortic valve is tricuspid. Aortic valve regurgitation is not  visualized. No aortic stenosis is present.  5. The inferior vena cava is normal in size with greater than 50%  respiratory variability, suggesting right atrial pressure of 3 mmHg.   Labs/Other Tests and Data Reviewed:    EKG:  No ECG reviewed.  Recent Labs: 04/01/2020: ALT 19 04/02/2020: B Natriuretic Peptide 21.9; TSH 2.676 04/09/2020: BUN 21; Creatinine, Ser 0.78; Hemoglobin 15.4; Platelets 252.0; Potassium 4.4; Sodium 137   Recent Lipid Panel Lab Results  Component Value  Date/Time   CHOL 226 (H) 04/13/2019 08:12 AM   TRIG 273.0 (H) 04/13/2019 08:12 AM   HDL 48.50 04/13/2019 08:12 AM   CHOLHDL 5 04/13/2019 08:12 AM   LDLCALC 149 (H) 05/12/2015 09:01 AM   LDLDIRECT 160.0 04/13/2019 08:12 AM    Wt Readings from Last 3 Encounters:  04/18/20 200 lb 3.2 oz (90.8 kg)  04/09/20 202 lb 4 oz (91.7 kg)  04/05/20 198 lb 1.6 oz (89.9 kg)     Objective:    Vital Signs:  BP 120/82    Pulse 79    Ht 5\' 3"  (1.6 m)    BMI 35.46 kg/m    VITAL SIGNS:  reviewed  General: No acute distress Pulmonary: No shortness of breath by phone Psych: Normal mood and affect  ASSESSMENT & PLAN:    1. Chronic systolic (congestive) heart failure (HCC) -Ejection fraction 45-50.  No symptoms of volume overload by video visit.  She is  appears to be doing well.  I did caution her with steroids.  She is on Coreg and Aldactone.  She has no symptoms of heart failure.  No strong indication for ARB.  We will continue to monitor this for now.  2. Essential hypertension -Blood pressure well controlled.  She will continue current medications.  Improved with dietary modification.  We will also keep an eye on her cholesterol.  3. Complete heart block (HCC) -Status post pacemaker in 2017 with Dr. Elberta Fortis.  Pacemaker functioning well.  No issues.  COVID-19 Education: The signs and symptoms of COVID-19 were discussed with the patient and how to seek care for testing (follow up with PCP or arrange E-visit).   The importance of social distancing was discussed today.  Time:  Today, I have spent 25 minutes with the patient with telehealth technology discussing the above problems.     Medication Adjustments/Labs and Tests Ordered: Current medicines are reviewed at length with the patient today.  Concerns regarding medicines are outlined above.   Tests Ordered: No orders of the defined types were placed in this encounter.   Medication Changes: No orders of the defined types were placed in this  encounter.   Follow Up:  In Person in 1 year  Signed, [name], MD  05/13/2020 11:26 AM    [facility]

## 2020-05-13 ENCOUNTER — Telehealth (INDEPENDENT_AMBULATORY_CARE_PROVIDER_SITE_OTHER): Payer: BC Managed Care – PPO | Admitting: Cardiovascular Disease

## 2020-05-13 ENCOUNTER — Encounter: Payer: Self-pay | Admitting: Cardiovascular Disease

## 2020-05-13 ENCOUNTER — Other Ambulatory Visit: Payer: Self-pay | Admitting: Family Medicine

## 2020-05-13 VITALS — BP 120/82 | HR 79 | Ht 63.0 in

## 2020-05-13 DIAGNOSIS — I10 Essential (primary) hypertension: Secondary | ICD-10-CM

## 2020-05-13 DIAGNOSIS — I442 Atrioventricular block, complete: Secondary | ICD-10-CM | POA: Diagnosis not present

## 2020-05-13 DIAGNOSIS — I5022 Chronic systolic (congestive) heart failure: Secondary | ICD-10-CM

## 2020-05-13 NOTE — Patient Instructions (Signed)

## 2020-05-14 NOTE — Telephone Encounter (Signed)
Last office visit 04/09/2020 hospital follow up.  Last refilled 11/06/2019 #30 with 3 refills.  CPE scheduled for 10/08/2020.

## 2020-06-06 ENCOUNTER — Telehealth: Payer: Self-pay | Admitting: Internal Medicine

## 2020-06-06 ENCOUNTER — Encounter: Payer: Self-pay | Admitting: Internal Medicine

## 2020-06-06 ENCOUNTER — Other Ambulatory Visit: Payer: Self-pay

## 2020-06-06 ENCOUNTER — Ambulatory Visit: Payer: BC Managed Care – PPO | Admitting: Internal Medicine

## 2020-06-06 VITALS — BP 116/70 | HR 65 | Temp 96.9°F | Ht 63.0 in | Wt 206.0 lb

## 2020-06-06 DIAGNOSIS — Z7185 Encounter for immunization safety counseling: Secondary | ICD-10-CM

## 2020-06-06 DIAGNOSIS — I5189 Other ill-defined heart diseases: Secondary | ICD-10-CM | POA: Diagnosis not present

## 2020-06-06 DIAGNOSIS — J849 Interstitial pulmonary disease, unspecified: Secondary | ICD-10-CM

## 2020-06-06 DIAGNOSIS — Z7189 Other specified counseling: Secondary | ICD-10-CM | POA: Diagnosis not present

## 2020-06-06 DIAGNOSIS — J9601 Acute respiratory failure with hypoxia: Secondary | ICD-10-CM | POA: Diagnosis not present

## 2020-06-06 NOTE — Patient Instructions (Signed)
ICD-10-CM   1. ILD (interstitial lung disease) (HCC)  J84.9   2. Vaccine counseling  Z71.89     Improved overall   Plan  - use o2 only for pulse ox < 88% - continue prednisone 10mg  per day through mid oct 2021 -> then 5mg  per day through mid-nov 2021 -> then 5mg  Monday, Wed, Friday -> till further notice  - HRCT supine and prone early dec 2021  - spirometry and dlco early dec 2021 - flu shot when available - covid vaccine (moderna or pfizer) highly recommended   Followup  - early dec 2021 to discuss clinical course   - sILD symptom score and simple walk test on RA at followup

## 2020-06-06 NOTE — Progress Notes (Signed)
[name]    History    59 y/o F, former smoker, who presented to [facility] on 7/13 with worsening shortness of breath.   The patient reports her increase in shortness of breath began around the first of July.  She had noted her oxygen levels to be in the high 80's at home.  She reports progressively increased shortness of breath, dyspnea with exertion and difficulty walking a flight of stairs.  She has also noted a tightness in the middle of her chest.  She has not had a COVID vaccine.    The patient is a Conservator, museum/gallery.  She is a former smoker quit 2012 after diagnosis of ILD.  She was previously followed by Pulmonary at [facility] (Dr. Ramond Dial, last seen in 2016) for ground glass opacities but has not seen them in several years due to financial constraints. She was supposed to have a lung biopsy. Her symptoms at the time were thought to be RBILD.  At that time she quit smoking and her FEV1 increased from 85 to >90 and infiltrates improved on CT.   ER evaluation included a CTA chest which was negative for PE but showed worsening ILD from her prior CT scan.  Troponin flat.    PCCM consulted for pulmonary evaluation.  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  Since it started 1 month ago is getting worse.  There is episodic dyspnea.  Severity is listed below.  She also has a cough that she reports specifically started on Monday, March 31, 2020.  Since it started and with steroids it is better.  It is mild in intensity.  She does bring up some phlegm that is green.  A separate brown speckles.  There is no hemoptysis.  Cough does not get worse when she lies down.  Does not affect her voice.  Does not clear her throat.  No tickle.  All the symptoms started  approximately 5 weeks before admission.  SYMPTOM SCALE - ILD 04/04/2020   O2 use RA - 87% in hospital  Shortness of Breath 0 -> 5 scale with 5 being worst (score 6 If unable to do)  At rest 2  Simple tasks - showers, clothes change, eating, shaving 2  Household (dishes, doing bed, laundry) 5  Shopping 5  Walking level at own pace 4  Walking up Stairs 5  Total (30-36) Dyspnea Score 23  How bad is your cough? x  How bad is your fatigue x  How bad is nausea x  How bad is vomiting?  x  How bad is diarrhea? x  How bad is anxiety? x  How bad is depression x       Past Medical History : Positive for heart failure.  Third-degree heart block she has a pacer.  This has been going on for several years.  Currently ejection fraction reported as slightly low normal but according to Dr. Rito Ehrlich and the hospitalist cardiology reviewed the echo again and feel the ejection fraction is normal.  Current BNP is normal.  She has obesity.  Otherwise negative past medical history.  No COPD or asthma.  No collagen vascular disease no scleroderma no lupus no polymyositis.  Autoimmune panel is all negative.  No stroke or thyroid disease.  No hepatitis.  No history of blood clot.   ROS: Positive for dyspnea.  Was stable dyspnea all the way from 2012 up until recently 5 weeks ago.  There is associated with fatigue and arthralgia.  No dysphagia.  No acid reflux.  No vomiting no nausea.   FAMILY HISTORY of LUNG DISEASE: Positive for COPD in the diet and asthma in the past.  No history of pulmonary fibrosis.  No cystic fibrosis denies hypersensitive pneumonitis no autoimmune disease.   EXPOSURE HISTORY:  -She did smoke cigarettes between 1979 and 2012.  She quit smoking in 2012 when her interstitial lung disease was diagnosed.  She smoked around 10 cigarettes a day.  No cigars.  No passive smoking.  No vaping.  No marijuana use.  No cocaine use.  No intravenous drug use.   HOME and HOBBY  DETAILS : [place]  Is a single-family home is also 59 years of age.  In the house there is no dampness.  There is no mold or mildew in the shower curtain.  There is no mold or mildew anywhere in the house.  There is no humidifier in the house no CPAP use in the house.  No nebulizer machine.  No steam iron.  No Jacuzzi.  No misting Fountain in the house.  No pet birds or parakeets.  No pet gerbils.  No feather pillows or blankets.  No music habits.  No gardening habits.  She does throw prone to the deer but there is no mold in the:   OCCUPATIONAL HISTORY (122 questions) : There is significant water damage or mold exposure at work.  She works for [employer] since 2016.  Prior to this she was not working any atmosphere that had dampness of mold.  She states the building is a very old building.  She says that there has been significant water damage and water leak.  Water has port in into the several rooms in the office.  She says there is mold in the hallways.  There is mold in the break room.  There is mold in the bosses office.  She is not sure but she thinks there is mold even in her cubicle.  Is been going on for several years but increased in the last few years.  She says on her desk every morning there are black spots but she does not know what it is.  Her husband showed pictures of the office of the right leg significant dampness and rotting of the walls and floor.  She works as a Catering manager.  It is just her and her boss.  Rest of the questionnaire is negative.   PULMONARY TOXICITY HISTORY (27 items): Never been on urinary antibiotic Macrodantin.  Currently on prednisone.   RECENT LABS  Results for ALEX, MCMANIGAL (MRN [id number]) as of 04/04/2020 12:42  Ref. Range 04/02/2020 18:57  Anti Nuclear Antibody (ANA) Latest Ref Range: Negative  Negative  ANCA Proteinase 3 Latest Ref Range: 0.0 - 3.5 U/mL <3.5  CCP  Antibodies IgG/IgA Latest Ref Range: 0 - 19 units 4  Myeloperoxidase Abs Latest Ref Range: 0.0 - 9.0 U/mL <9.0  RA Latex Turbid. Latest Ref Range: 0.0 - 13.9 IU/mL <10.0    CT Chest High Resolution  Result Date: 04/03/2020 CLINICAL DATA:  Inpatient. Evaluate interstitial lung disease. Dyspnea. EXAM: CT CHEST WITHOUT CONTRAST TECHNIQUE: Multidetector CT imaging of the chest was performed following the  standard protocol without intravenous contrast. High resolution imaging of the lungs, as well as inspiratory and expiratory imaging, was performed. COMPARISON:  04/02/2020 chest CT angiogram.  10/08/2010 chest CT. FINDINGS: Cardiovascular: Normal heart size. No significant pericardial effusion/thickening. Two lead left subclavian pacemaker with lead tips in the right atrium and right ventricular apex. Mildly atherosclerotic nonaneurysmal thoracic aorta. Normal caliber pulmonary arteries. Mediastinum/Nodes: No discrete thyroid nodules. Unremarkable esophagus. No pathologically enlarged axillary, mediastinal or hilar lymph nodes, noting limited sensitivity for the detection of hilar adenopathy on this noncontrast study. Lungs/Pleura: No pneumothorax. No pleural effusion. Mild centrilobular and paraseptal emphysema in the upper lobes. No acute consolidative airspace disease or lung masses. Right middle lobe solid 7 mm pulmonary nodule along the minor fissure (series 7/image 65), stable since 2012 CT, considered benign. No additional significant pulmonary nodules. Patchy confluent peripheral reticulation and peripheral lines throughout both lungs with associated mild traction bronchiectasis and architectural distortion, with regions of relative sparing of the immediate subpleural lungs. No clear apicobasilar gradient to these findings. No frank honeycombing (mild subpleural cystic changes in the upper lobes are favored represent paraseptal emphysema). These findings have progressed significantly since 10/08/2010  chest CT. Upper abdomen: No acute abnormality. Musculoskeletal: No aggressive appearing focal osseous lesions. Moderate thoracic spondylosis. IMPRESSION: 1. Spectrum of findings compatible with fibrotic interstitial lung disease without frank honeycombing or clear apicobasilar gradient. Findings have progressed since 2012 chest CT. The regions of immediate subpleural sparing are more suggestive of fibrotic phase nonspecific interstitial pneumonia (NSIP), although usual interstitial pneumonia (UIP) is on the differential particularly given progression. Findings are indeterminate for UIP per consensus guidelines: Diagnosis of Idiopathic Pulmonary Fibrosis: An Official ATS/ERS/JRS/ALAT Clinical Practice Guideline. Am Rosezetta Schlatter Crit Care Med Vol 198, Iss 5, [phone], May 21 2017. 2. Aortic Atherosclerosis (ICD10-I70.0) and Emphysema (ICD10-J43.9). Electronically Signed   By: Delbert Phenix M.D.   On: 04/03/2020 17:23   COURS  Significant Hospital Events   7/13 Admit  7/15 -  she says in jan 2012 dx with ILD . Jan 2021 was CT. In may 2012 she quti smoking. Followed with Dr Antony Salmon. Last visit with him and CT was in 2015. But stable till June 2021 and desaturating with exercise at home. Now needing oxygen. She is nervous about procedures incluidng bronch. Feels better after steroids   04/04/2020 - > feels better. 87% on RA at rest now.  Husband reports mild confusion overnight.  He is wondering if the lorazepam, steroids and oxygen are contributing to this.  This morning she not as happy as and cheerful as before.  Otherwise no new complaints.  Overall she feels better since admission and attributes to steroids.  We went over the ILD questionnaire and is documented below.  Assessment & Plan:   #Baseline: Interstitial lung disease -in 2012.  Previous smoker and quit and stable  #Current: Significant mold exposure x4 years especially worse in the last few years.  Interstitial lung disease flareup with new  oxygen dependency this admission.  High-resolution CT chest alternative pattern to UIP but no clear description of air trapping  -Discussion: She might have had RB ILD in the past or NSIP based on the CT scan appearance.  She is doing stable after quitting smoking but in the last few years she has had mold exposure and it has been a deterioration and presentation with symptoms suggestive of ILD flareup.  It appears that systolic heart failure is not under consideration especially with normal BNP.  Serologies are negative.  She does not want any biopsy or invasive procedures.  Clinically this is not UIP/IPF.  There is no evidence of autoimmune interstitial lung disease.  Baseline features overall could be NSIP or hypersensitive pneumonitis [although no air-trapping] or possibly smokers interstitial lung disease.  However the current flareup seems temporally related to mold exposure.  Is the only inciting antigen that we can find currently.  And therefore this could be a flareup and progression of previous NSIP or she has underlying hypersensitive pneumonitis.  Plan -Under no circumstances she should go back to her work atmosphere where there is mold exposure  -Needs prednisone for few to several weeks and may be 1 few months depending on the course [she seems to be improving although there is some mild confusion last night which could be multifactorial from benzodiazepines and steroids] -> okay to switch to p.o. prednisone April 05, 2020 at 50 mg/day x 2 weeks and then 40 mg/day x 2 weeks and then 30 mg/day x 2 weeks and then 20 mg/day x 2 weeks and then 10 mg/day to continue until further instructions  -Obtain hypersensitive pneumonitis blood panel while in the hospital and blood QuantiFERON gold  -Obtain spirometry and DLCO while in the hospital if possible  -Seems to still need oxygen at rest 2 L [today she is 87% on room air at rest although this is an improvement]  -Meet with Community education officer for disability information which she is interested  - Prognosis: Unclear prognosis.  It is possible that with steroids over few to several weeks or a few months that she improves and goes back to baseline.  It is also possible that she has only partial improvement.  There is also a small chance she just deals with progressive ILD in which case antifibrotic's would be indicated as an outpatient.  Overall I am optimistic that she would have at least a partial improvement  -She would need to lose weight and get fit (given age below 89 -in the long run she might be a candidate for lung transplant if the need arose] -we will address this in the office  -Hospitalist to deal with encephalopathy if present.  At this point in time I have discontinued the lorazepam but left on her home diazepam   - can go home 04/05/20= after social work and o2 asessment  She and her husband updated  Results for SYONA, CRAIGE (MRN [id number]) as of 04/03/2020 13:38  Ref. Range 10/12/2010 20:44 05/12/2015 09:18 05/12/2015 09:33 04/02/2020 18:57  Anti Nuclear Antibody (ANA) Latest Ref Range: NEGATIVE  NEG NEG    Cyclic Citrullin Peptide Ab Latest Ref Range: 0.0 - 5.0 U/mL  <2.0    ds DNA Ab Latest Units: IU/mL  1    RA Latex Turbid. Latest Ref Range: 0.0 - 13.9 IU/mL <10 IU/mL <10  <10.0  Tissue Transglut Ab Latest Ref Range: <6 U/mL  1    ENA SM Ab Ser-aCnc Latest Ref Range: <1.0 NEG AI    <1.0 NEG   Results for AYLAH, LAURAIN (MRN [id number]) as of 04/03/2020 13:38  Ref. Range 08/27/2010 11:32 10/12/2010 09:33 05/12/2015 09:01 04/02/2020 18:57  Sed Rate Latest Ref Range: 0 - 22 mm/hr 16 16 10 8       OV 06/06/2020  Subjective:  Patient ID: Anne Rush, female , DOB: Jul 01, 1961 , age 31 y.o. , MRN: [id number] , ADDRESS: [address] [place] [address]   06/06/2020 -   Chief Complaint  Patient presents with  . Follow-up    breathing is doing well with 2L. c/o non  prod cough.    #Baseline interstitial lung disease in 2012 with progression in July 2021.  High-resolution CT chest pattern with alternative diagnosis suggestive of NSIP but significant mold exposure at workplace leading up to July 2021 although no air-trapping.  Started empiric prednisone mid July 2021.  Reluctant to have surgical lung biopsy or transbronchial biopsy of bronchoscopy lavage  HPI Roosevelt Bisher Theroux 59 y.o. -returns for follow-up.  Last seen in mid July 2021 in the hospital.  Since then she is seen nurse practitioner once towards end of July 2021.  She reports overall improvement in symptoms but still has some dyspnea.  She says when she is on room air up and about in the house after 45 minutes to 1 hour her pulse ox nadir is around 88 to 92%.  She then uses oxygen to give her some relief.  She does not realize that oxygen is indicated if it is less than 88% only.  She says she is doing well with the prednisone overall although she has gained weight and has developed features of "moon face".  She continues to be hesitant about having any intervention to diagnose underlying ILD.  She is now off work and she is trying to pursue Social Security disability.  We did discuss the fact that if her condition improves she may not qualify for it.  She is not interested in [employer]. because she has good personal relationship with her previous Merchandiser, retail.  There is an invitation for her to work remotely and she is pondering that.  She does not have social short-term disability through her employer  In terms of vaccination:  She is going to have a flu shot in the month of October 2021 through primary care physician.  She has not had the Covid vaccine.  She is hesitant about the Covid vaccine because of fear of side effects and efficacy.  We discussed this extensively.  She is immunosuppressed because of prednisone.  She is also has interstitial lung disease and obesity and age over 69.      SYMPTOM  SCALE - ILD 04/04/2020  06/06/2020   O2 use RA - 87% in hospital Uses o2 at home but below walk in on RA  Shortness of Breath 0 -> 5 scale with 5 being worst (score 6 If unable to do)   At rest 2 -  Simple tasks - showers, clothes change, eating, shaving 2 3  Household (dishes, doing bed, laundry) 5 3  Shopping 5 x  Walking level at own pace 4 2  Walking up Stairs 5 3.5  Total (30-36) Dyspnea Score 23 11.5  How bad is your cough? x 1  How bad is your fatigue x 4  How bad is nausea x 0  How bad is vomiting?  x 0  How bad is diarrhea? x 2  How bad is anxiety? x 3  How bad is depression x 1      Simple office walk 185 feet x  3 laps goal with forehead probe 06/06/2020   O2 used ra  Number laps completed 3  Comments about pace normal  Resting Pulse Ox/HR 98% and 101/min  Final Pulse Ox/HR 95% and 118/min  Desaturated </= 88% x  Desaturated <= 3% points c=x  Got Tachycardic >/= 90/min x  Symptoms at end of test x  Miscellaneous comments x  ROS - per HPI     has a past medical history of Anxiety, Family history of ovarian cancer, Hyperlipidemia, Hypertension, Interstitial lung disease (HCC), Pollen allergies, Squamous cell carcinoma (2014), and Subclinical hyperthyroidism (06/17/2014).   reports that she quit smoking about 9 years ago. She has a 15.00 pack-year smoking history. She has never used smokeless tobacco.  Past Surgical History:  Procedure Laterality Date  . COSMETIC SURGERY    . EP IMPLANTABLE DEVICE N/A 02/26/2016   Procedure: Pacemaker Implant;  Surgeon: Will Jorja Loa, MD;  Location: [facility] INVASIVE CV LAB;  Service: Cardiovascular;  Laterality: N/A;  . TUBAL LIGATION      Allergies  Allergen Reactions  . Codeine Hives and Itching  . Losartan Potassium Other (See Comments)  . Neomycin-Polymyxin-Dexameth Itching, Swelling and Other (See Comments)    Swelling and itching of eye   . Oxycodone-Acetaminophen Other (See Comments)    REACTION:  unspecified  . Paroxetine Other (See Comments)    unknown  . Sulfonamide Derivatives Other (See Comments)    REACTION: unspecified  . Lisinopril Rash  . Sulfa Antibiotics Hives and Rash         Immunization History  Administered Date(s) Administered  . Influenza Nasal 06/20/2017  . Influenza Whole 07/04/2008, 06/18/2009, 08/27/2010  . Influenza, Seasonal, Injecte, Preservative Fre 09/03/2010, 06/22/2011, 06/27/2012  . Influenza,inj,Quad PF,6+ Mos 07/21/2015, 07/06/2018  . Influenza-Unspecified 06/27/2012, 07/17/2014, 07/19/2016, 07/21/2019  . Td 05/10/2002  . Tdap 10/28/2014    Family History  Problem Relation Age of Onset  . Anxiety disorder Mother   . Hypertension Mother   . Ovarian cancer Mother 70  . Cancer Maternal Aunt        breast  . Heart failure Maternal Grandfather      Current Outpatient Medications:  .  Ascorbic Acid (VITAMIN C) 1000 MG tablet, Take 1,000 mg by mouth daily., Disp: , Rfl:  .  benzonatate (TESSALON) 200 MG capsule, Take 1 capsule (200 mg total) by mouth 3 (three) times daily as needed for cough., Disp: 30 capsule, Rfl: 1 .  carvedilol (COREG) 25 MG tablet, TAKE ONE TABLET BY MOUTH TWICE A DAY WITH MEALS, Disp: 180 tablet, Rfl: 2 .  cholecalciferol (VITAMIN D3) 25 MCG (1000 UT) tablet, Take 1,000 Units by mouth daily. , Disp: , Rfl:  .  diazepam (VALIUM) 5 MG tablet, TAKE ONE TABLET BY MOUTH EVERY 8 HOURS AS NEEDED FOR ANXIETY, Disp: 30 tablet, Rfl: 3 .  famotidine (PEPCID) 20 MG tablet, Take 1 tablet (20 mg total) by mouth daily., Disp: 30 tablet, Rfl: 0 .  hydrochlorothiazide (HYDRODIURIL) 12.5 MG tablet, Take 1 tablet (12.5 mg total) by mouth daily., Disp: , Rfl:  .  MAGNESIUM PO, Take 1 tablet by mouth daily., Disp: , Rfl:  .  PREDNISONE PO, Take by mouth. Taking 30 mg for 14 days, then 20 mg for 14 days, and then 10 mg for 14 days., Disp: , Rfl:  .  spironolactone (ALDACTONE) 25 MG tablet, Take 1 tablet (25 mg total) by mouth daily., Disp: 90  tablet, Rfl: 1 .  Zinc 30 MG TABS, Take 30 mg by mouth daily., Disp: , Rfl:       Objective:   Vitals:   06/06/20 1139  BP: 116/70  Pulse: 65  Temp: (!) 96.9 F (36.1 C)  TempSrc: Temporal  SpO2: 100%  Weight: 206 lb (93.4 kg)  Height:  (1.6 m)    Estimated body mass index is 36.49 kg/m as calculated  from the following:   Height as of this encounter: 5\' 3"  (1.6 m).   Weight as of this encounter: 206 lb (93.4 kg).  @WEIGHTCHANGE @    06/06/20 1139  Weight: 206 lb (93.4 kg)     Physical Exam  General Appearance:    Alert, cooperative, no distress, appears stated age - yes , Deconditioned looking - no , OBESE  - yes, Sitting on Wheelchair -  no  Head:    Normocephalic, without obvious abnormality, atraumatic  Eyes:    PERRL, conjunctiva/corneas clear,  Ears:    Normal TM's and external ear canals, both ears  Nose:   Nares normal, septum midline, mucosa normal, no drainage    or sinus tenderness. OXYGEN ON  - yes . Patient is @ 2L   Throat:   Lips, mucosa, and tongue normal; teeth and gums normal. Cyanosis on lips - no  Neck:   Supple, symmetrical, trachea midline, no adenopathy;    thyroid:  no enlargement/tenderness/nodules; no carotid   bruit or JVD  Back:     Symmetric, no curvature, ROM normal, no CVA tenderness  Lungs:     Distress - no , Wheeze no, Barrell Chest - no, Purse lip breathing - no, Crackles - no   Chest Wall:    No tenderness or deformity.    Heart:    Regular rate and rhythm, S1 and S2 normal, no rub   or gallop, Murmur - no  Breast Exam:    NOT DONE  Abdomen:     Soft, non-tender, bowel sounds active all four quadrants,    no masses, no organomegaly. Visceral obesity - yes  Genitalia:   NOT DONE  Rectal:   NOT DONE  Extremities:   Extremities - normal, Has Cane - no, Clubbing - no, Edema - no  Pulses:   2+ and symmetric all extremities  Skin:   Stigmata of Connective Tissue Disease - no  Lymph nodes:   Cervical,  supraclavicular, and axillary nodes normal  Psychiatric:  Neurologic:   Pleasant - yes, Anxious - no, Flat affect - no  CAm-ICU - neg, Alert and Oriented x 3 - yes, Moves all 4s - yes, Speech - normal, Cognition - intact           Assessment:       ICD-10-CM   1. ILD (interstitial lung disease) (HCC)  J84.9 Pulmonary Function Test [facility] Only  2. Vaccine counseling  Z71.89   3. Interstitial pulmonary disease (HCC)  J84.9 CT Chest High Resolution   At this point in time we are treating her as though she has had a flareup of ILD due to mold exposure.  May be she has underlying hypersensitive pneumonitis versus NSIP we do not know this.  We did discuss that prednisone in the long run can be harmful therefore we will aim to slowly taper and stop it over the next few months.  I written a taper plan for her.  After that we will restage her ILD.  I will again have conversations with her about interventional procedure requirement if the ILD pattern is indeterminate for UIP.  If she continues to be reluctant which I think she will then we will continue to support her either through supportive monitoring and antibiotics if she gets worse.    Plan:     Patient Instructions     ICD-10-CM   1. ILD (interstitial lung disease) (HCC)  J84.9   2. Vaccine counseling  Z71.89  Improved overall   Plan  - use o2 only for pulse ox < 88% - continue prednisone 10mg  per day through mid oct 2021 -> then 5mg  per day through mid-nov 2021 -> then 5mg  Monday, Wed, Friday -> till further notice  - HRCT supine and prone early dec 2021  - spirometry and dlco early dec 2021 - flu shot when available - covid vaccine (moderna or pfizer) highly recommended   Followup  - early dec 2021 to discuss clinical course   - sILD symptom score and simple walk test on RA at followup  ( Level 05 visit: Estb 40-54 min   in  visit type: on-site physical face to visit  in total care time and counseling or/and  coordination of care by this undersigned MD - Dr Jan 2022. This includes one or more of the following on this same day 06/06/2020: pre-charting, chart review, note writing, documentation discussion of test results, diagnostic or treatment recommendations, prognosis, risks and benefits of management options, instructions, education, compliance or risk-factor reduction. It excludes time spent by the CMA or office staff in the care of the patient. Actual time 42 min)    SIGNATURE    Dr. Jan 2022, M.D., F.C.C.P,  Pulmonary and Critical Care Medicine Staff Physician, [facility] Center Director - Interstitial Lung Disease  Program  Pulmonary Fibrosis [facility] at [facility] [place], [facility], [facility]  Pager: [phone], If no answer or between  15:00h - 7:00h: call [phone] Telephone: [phone]  1:37 PM 06/06/2020

## 2020-06-06 NOTE — Telephone Encounter (Signed)
Anne Rush  Pls call patient next week and let her know due to prednisone + obesity + ILD -she is high risk ffor severe covid if she gets covid. I actually recommend 3 shots of Moderna or [employer] . But she should atleast get 2 to start with.   Thanks  MR   Allergies  Allergen Reactions  . Codeine Hives and Itching  . Losartan Potassium Other (See Comments)  . Neomycin-Polymyxin-Dexameth Itching, Swelling and Other (See Comments)    Swelling and itching of eye   . Oxycodone-Acetaminophen Other (See Comments)    REACTION: unspecified  . Paroxetine Other (See Comments)    unknown  . Sulfonamide Derivatives Other (See Comments)    REACTION: unspecified  . Lisinopril Rash  . Sulfa Antibiotics Hives and Rash          Immunization History  Administered Date(s) Administered  . Influenza Nasal 06/20/2017  . Influenza Whole 07/04/2008, 06/18/2009, 08/27/2010  . Influenza, Seasonal, Injecte, Preservative Fre 09/03/2010, 06/22/2011, 06/27/2012  . Influenza,inj,Quad PF,6+ Mos 07/21/2015, 07/06/2018  . Influenza-Unspecified 06/27/2012, 07/17/2014, 07/19/2016, 07/21/2019  . Td 05/10/2002  . Tdap 10/28/2014

## 2020-06-06 NOTE — Telephone Encounter (Signed)
Patient is aware of below message and voiced her understanding.  Nothing further is needed at this time.  

## 2020-06-12 ENCOUNTER — Ambulatory Visit (INDEPENDENT_AMBULATORY_CARE_PROVIDER_SITE_OTHER): Payer: BC Managed Care – PPO | Admitting: Emergency Medicine

## 2020-06-12 DIAGNOSIS — I442 Atrioventricular block, complete: Secondary | ICD-10-CM

## 2020-06-12 LAB — CUP PACEART REMOTE DEVICE CHECK
Battery Remaining Longevity: 101 mo
Battery Remaining Percentage: 95.5 %
Battery Voltage: 2.96 V
Brady Statistic AP VP Percent: 3 %
Brady Statistic AP VS Percent: 1 %
Brady Statistic AS VP Percent: 97 %
Brady Statistic AS VS Percent: 1 %
Brady Statistic RA Percent Paced: 3 %
Brady Statistic RV Percent Paced: 99 %
Date Time Interrogation Session: 20210923020012
Implantable Lead Implant Date: 20170608
Implantable Lead Implant Date: 20170608
Implantable Lead Location: 753859
Implantable Lead Location: 753860
Implantable Pulse Generator Implant Date: 20170608
Lead Channel Impedance Value: 410 Ohm
Lead Channel Impedance Value: 440 Ohm
Lead Channel Pacing Threshold Amplitude: 0.75 V
Lead Channel Pacing Threshold Amplitude: 1 V
Lead Channel Pacing Threshold Pulse Width: 0.4 ms
Lead Channel Pacing Threshold Pulse Width: 0.4 ms
Lead Channel Sensing Intrinsic Amplitude: 1.3 mV
Lead Channel Sensing Intrinsic Amplitude: 12 mV
Lead Channel Setting Pacing Amplitude: 2 V
Lead Channel Setting Pacing Amplitude: 2.5 V
Lead Channel Setting Pacing Pulse Width: 0.4 ms
Lead Channel Setting Sensing Sensitivity: 2.5 mV
Pulse Gen Model: 2272
Pulse Gen Serial Number: 7903881

## 2020-06-17 NOTE — Progress Notes (Signed)
Remote pacemaker transmission.   

## 2020-07-02 ENCOUNTER — Other Ambulatory Visit: Payer: Self-pay | Admitting: Family Medicine

## 2020-07-02 ENCOUNTER — Telehealth: Payer: Self-pay | Admitting: Internal Medicine

## 2020-07-02 MED ORDER — PREDNISONE 5 MG PO TABS: ORAL_TABLET | ORAL | 2 refills | Status: DC

## 2020-07-02 NOTE — Telephone Encounter (Signed)
Lm for patient.  

## 2020-07-03 NOTE — Telephone Encounter (Signed)
Per our records, Rx for Prednisone was refilled on 07/02/2020. I have spoken to patient, who stated that she picked up Rx. Nothing further needed.

## 2020-07-06 DIAGNOSIS — J9601 Acute respiratory failure with hypoxia: Secondary | ICD-10-CM | POA: Diagnosis not present

## 2020-07-06 DIAGNOSIS — I5189 Other ill-defined heart diseases: Secondary | ICD-10-CM | POA: Diagnosis not present

## 2020-07-13 ENCOUNTER — Other Ambulatory Visit: Payer: Self-pay

## 2020-07-13 ENCOUNTER — Emergency Department: Payer: BC Managed Care – PPO

## 2020-07-13 ENCOUNTER — Emergency Department
Admission: EM | Admit: 2020-07-13 | Discharge: 2020-07-13 | Disposition: A | Discharge status: Home / Self Care | Payer: BC Managed Care – PPO | Source: Home / Self Care | Attending: Emergency Medicine | Admitting: Emergency Medicine

## 2020-07-13 DIAGNOSIS — I5022 Chronic systolic (congestive) heart failure: Secondary | ICD-10-CM | POA: Diagnosis not present

## 2020-07-13 DIAGNOSIS — N136 Pyonephrosis: Secondary | ICD-10-CM | POA: Diagnosis not present

## 2020-07-13 DIAGNOSIS — G9341 Metabolic encephalopathy: Secondary | ICD-10-CM | POA: Diagnosis not present

## 2020-07-13 DIAGNOSIS — N390 Urinary tract infection, site not specified: Secondary | ICD-10-CM | POA: Diagnosis not present

## 2020-07-13 DIAGNOSIS — I10 Essential (primary) hypertension: Secondary | ICD-10-CM | POA: Insufficient documentation

## 2020-07-13 DIAGNOSIS — N179 Acute kidney failure, unspecified: Secondary | ICD-10-CM | POA: Diagnosis not present

## 2020-07-13 DIAGNOSIS — R110 Nausea: Secondary | ICD-10-CM | POA: Diagnosis not present

## 2020-07-13 DIAGNOSIS — N049 Nephrotic syndrome with unspecified morphologic changes: Secondary | ICD-10-CM | POA: Insufficient documentation

## 2020-07-13 DIAGNOSIS — E785 Hyperlipidemia, unspecified: Secondary | ICD-10-CM | POA: Diagnosis not present

## 2020-07-13 DIAGNOSIS — J849 Interstitial pulmonary disease, unspecified: Secondary | ICD-10-CM | POA: Insufficient documentation

## 2020-07-13 DIAGNOSIS — F419 Anxiety disorder, unspecified: Secondary | ICD-10-CM | POA: Diagnosis present

## 2020-07-13 DIAGNOSIS — K219 Gastro-esophageal reflux disease without esophagitis: Secondary | ICD-10-CM | POA: Diagnosis present

## 2020-07-13 DIAGNOSIS — J84112 Idiopathic pulmonary fibrosis: Secondary | ICD-10-CM | POA: Diagnosis not present

## 2020-07-13 DIAGNOSIS — E871 Hypo-osmolality and hyponatremia: Secondary | ICD-10-CM | POA: Diagnosis not present

## 2020-07-13 DIAGNOSIS — R748 Abnormal levels of other serum enzymes: Secondary | ICD-10-CM | POA: Diagnosis not present

## 2020-07-13 DIAGNOSIS — I499 Cardiac arrhythmia, unspecified: Secondary | ICD-10-CM | POA: Diagnosis not present

## 2020-07-13 DIAGNOSIS — R55 Syncope and collapse: Secondary | ICD-10-CM | POA: Diagnosis not present

## 2020-07-13 DIAGNOSIS — B964 Proteus (mirabilis) (morganii) as the cause of diseases classified elsewhere: Secondary | ICD-10-CM | POA: Diagnosis not present

## 2020-07-13 DIAGNOSIS — Z950 Presence of cardiac pacemaker: Secondary | ICD-10-CM | POA: Insufficient documentation

## 2020-07-13 DIAGNOSIS — J9811 Atelectasis: Secondary | ICD-10-CM | POA: Diagnosis not present

## 2020-07-13 DIAGNOSIS — I951 Orthostatic hypotension: Secondary | ICD-10-CM | POA: Diagnosis not present

## 2020-07-13 DIAGNOSIS — R0902 Hypoxemia: Secondary | ICD-10-CM | POA: Diagnosis not present

## 2020-07-13 DIAGNOSIS — N10 Acute tubulo-interstitial nephritis: Secondary | ICD-10-CM | POA: Diagnosis not present

## 2020-07-13 DIAGNOSIS — N132 Hydronephrosis with renal and ureteral calculous obstruction: Secondary | ICD-10-CM | POA: Diagnosis not present

## 2020-07-13 DIAGNOSIS — A419 Sepsis, unspecified organism: Secondary | ICD-10-CM | POA: Diagnosis not present

## 2020-07-13 DIAGNOSIS — N138 Other obstructive and reflux uropathy: Secondary | ICD-10-CM | POA: Diagnosis not present

## 2020-07-13 DIAGNOSIS — Z85820 Personal history of malignant melanoma of skin: Secondary | ICD-10-CM | POA: Insufficient documentation

## 2020-07-13 DIAGNOSIS — E559 Vitamin D deficiency, unspecified: Secondary | ICD-10-CM | POA: Diagnosis not present

## 2020-07-13 DIAGNOSIS — E876 Hypokalemia: Secondary | ICD-10-CM | POA: Diagnosis present

## 2020-07-13 DIAGNOSIS — Z7952 Long term (current) use of systemic steroids: Secondary | ICD-10-CM | POA: Diagnosis not present

## 2020-07-13 DIAGNOSIS — R6520 Severe sepsis without septic shock: Secondary | ICD-10-CM | POA: Diagnosis not present

## 2020-07-13 DIAGNOSIS — R778 Other specified abnormalities of plasma proteins: Secondary | ICD-10-CM | POA: Diagnosis present

## 2020-07-13 DIAGNOSIS — R7881 Bacteremia: Secondary | ICD-10-CM | POA: Diagnosis not present

## 2020-07-13 DIAGNOSIS — R600 Localized edema: Secondary | ICD-10-CM | POA: Diagnosis not present

## 2020-07-13 DIAGNOSIS — A4159 Other Gram-negative sepsis: Secondary | ICD-10-CM | POA: Diagnosis not present

## 2020-07-13 DIAGNOSIS — R52 Pain, unspecified: Secondary | ICD-10-CM | POA: Diagnosis not present

## 2020-07-13 DIAGNOSIS — N2889 Other specified disorders of kidney and ureter: Secondary | ICD-10-CM | POA: Diagnosis not present

## 2020-07-13 DIAGNOSIS — J988 Other specified respiratory disorders: Secondary | ICD-10-CM | POA: Diagnosis not present

## 2020-07-13 DIAGNOSIS — N202 Calculus of kidney with calculus of ureter: Secondary | ICD-10-CM | POA: Insufficient documentation

## 2020-07-13 DIAGNOSIS — Z20822 Contact with and (suspected) exposure to covid-19: Secondary | ICD-10-CM | POA: Diagnosis not present

## 2020-07-13 DIAGNOSIS — N200 Calculus of kidney: Secondary | ICD-10-CM | POA: Diagnosis not present

## 2020-07-13 DIAGNOSIS — R4182 Altered mental status, unspecified: Secondary | ICD-10-CM | POA: Diagnosis not present

## 2020-07-13 DIAGNOSIS — Z87891 Personal history of nicotine dependence: Secondary | ICD-10-CM | POA: Insufficient documentation

## 2020-07-13 DIAGNOSIS — Z79899 Other long term (current) drug therapy: Secondary | ICD-10-CM | POA: Insufficient documentation

## 2020-07-13 DIAGNOSIS — D696 Thrombocytopenia, unspecified: Secondary | ICD-10-CM | POA: Diagnosis not present

## 2020-07-13 DIAGNOSIS — I110 Hypertensive heart disease with heart failure: Secondary | ICD-10-CM | POA: Diagnosis present

## 2020-07-13 DIAGNOSIS — I442 Atrioventricular block, complete: Secondary | ICD-10-CM | POA: Diagnosis not present

## 2020-07-13 DIAGNOSIS — D6959 Other secondary thrombocytopenia: Secondary | ICD-10-CM | POA: Diagnosis not present

## 2020-07-13 LAB — COMPREHENSIVE METABOLIC PANEL
ALT: 20 U/L (ref 0–44)
AST: 22 U/L (ref 15–41)
Albumin: 4.3 g/dL (ref 3.5–5.0)
Alkaline Phosphatase: 37 U/L — ABNORMAL LOW (ref 38–126)
Anion gap: 12 (ref 5–15)
BUN: 13 mg/dL (ref 6–20)
CO2: 27 mmol/L (ref 22–32)
Calcium: 9.2 mg/dL (ref 8.9–10.3)
Chloride: 102 mmol/L (ref 98–111)
Creatinine, Ser: 0.84 mg/dL (ref 0.44–1.00)
GFR, Estimated: >60 mL/min (ref >60)
Glucose, Bld: 138 mg/dL — ABNORMAL HIGH (ref 70–99)
Potassium: 3.7 mmol/L (ref 3.5–5.1)
Sodium: 141 mmol/L (ref 135–145)
Total Bilirubin: 1.5 mg/dL — ABNORMAL HIGH (ref 0.3–1.2)
Total Protein: 7.3 g/dL (ref 6.5–8.1)

## 2020-07-13 LAB — URINALYSIS, COMPLETE (UACMP) WITH MICROSCOPIC
Bacteria, UA: NONE SEEN
Bilirubin Urine: NEGATIVE
Glucose, UA: NEGATIVE mg/dL
Hgb urine dipstick: NEGATIVE
Ketones, ur: 20 mg/dL — AB
Nitrite: NEGATIVE
Protein, ur: NEGATIVE mg/dL
Specific Gravity, Urine: 1.014 (ref 1.005–1.030)
pH: 7 (ref 5.0–8.0)

## 2020-07-13 LAB — CBC
HCT: 39.7 % (ref 36.0–46.0)
Hemoglobin: 14.2 g/dL (ref 12.0–15.0)
MCH: 32.4 pg (ref 26.0–34.0)
MCHC: 35.8 g/dL (ref 30.0–36.0)
MCV: 90.6 fL (ref 80.0–100.0)
Platelets: 209 10*3/uL (ref 150–400)
RBC: 4.38 MIL/uL (ref 3.87–5.11)
RDW: 12.2 % (ref 11.5–15.5)
WBC: 14.7 10*3/uL — ABNORMAL HIGH (ref 4.0–10.5)
nRBC: 0 % (ref 0.0–0.2)

## 2020-07-13 LAB — LIPASE, BLOOD: Lipase: 25 U/L (ref 11–51)

## 2020-07-13 MED ORDER — ONDANSETRON HCL 4 MG/2ML IJ SOLN
4.0000 mg | Freq: Once | INTRAMUSCULAR | Status: AC
  Administered 2020-07-13: 4 mg via INTRAVENOUS
  Filled 2020-07-13: qty 2

## 2020-07-13 MED ORDER — TAMSULOSIN HCL 0.4 MG PO CAPS
0.4000 mg | ORAL_CAPSULE | Freq: Every day | ORAL | 0 refills | Status: DC
Start: 2020-07-13 — End: 2020-07-13

## 2020-07-13 MED ORDER — OXYCODONE-ACETAMINOPHEN 5-325 MG PO TABS: 1.0000 | ORAL_TABLET | Freq: Four times a day (QID) | ORAL | 0 refills | Status: DC | PRN

## 2020-07-13 MED ORDER — PROMETHAZINE HCL 25 MG PO TABS
25.0000 mg | ORAL_TABLET | Freq: Four times a day (QID) | ORAL | 0 refills | Status: DC | PRN
Start: 2020-07-13 — End: 2020-07-13

## 2020-07-13 MED ORDER — PROMETHAZINE HCL 25 MG PO TABS: 25.0000 mg | ORAL_TABLET | Freq: Four times a day (QID) | ORAL | 0 refills | Status: DC | PRN

## 2020-07-13 MED ORDER — MORPHINE SULFATE (PF) 4 MG/ML IV SOLN
4.0000 mg | Freq: Once | INTRAVENOUS | Status: AC
  Administered 2020-07-13: 4 mg via INTRAVENOUS
  Filled 2020-07-13: qty 1

## 2020-07-13 MED ORDER — TAMSULOSIN HCL 0.4 MG PO CAPS: 0.4000 mg | ORAL_CAPSULE | Freq: Every day | ORAL | 0 refills | Status: DC

## 2020-07-13 MED ORDER — KETOROLAC TROMETHAMINE 30 MG/ML IJ SOLN
30.0000 mg | Freq: Once | INTRAMUSCULAR | Status: AC
  Administered 2020-07-13: 30 mg via INTRAVENOUS
  Filled 2020-07-13: qty 1

## 2020-07-13 MED ORDER — SODIUM CHLORIDE 0.9 % IV BOLUS
1000.0000 mL | Freq: Once | INTRAVENOUS | Status: AC
  Administered 2020-07-13: 1000 mL via INTRAVENOUS

## 2020-07-13 NOTE — ED Provider Notes (Signed)
[facility] Emergency Department Provider Note  ____________________________________________  Time seen: Approximately 2:17 PM  I have reviewed the triage vital signs and the nursing notes.   HISTORY  Chief Complaint No chief complaint on file.    HPI Anne Rush is a 59 y.o. female who presents the emergency department complaining of left flank pain.  Patient had sudden onset of left flank pain beginning last night at 1030.  Patient states that she has had nausea due to pain but with one episode of clear emesis..  No diarrhea or constipation.  Patient has had no dysuria, polyuria, hematuria.  No history of nephrolithiasis.  No history of chronic GI complaints such as IBS, Crohn's, diverticulitis or diverticulosis.  Patient reports pain is radiating from the left flank into the left groin.  Patient has been taken Tylenol at home.  Patient with medical history as described below.  No complaints with chronic medical issues.         Past Medical History:  Diagnosis Date  . Anxiety   . Family history of ovarian cancer   . Hyperlipidemia   . Hypertension   . Interstitial lung disease (HCC)   . Pollen allergies   . Squamous cell carcinoma 2014   skin cancer  . Subclinical hyperthyroidism 06/17/2014    Patient Active Problem List   Diagnosis Date Noted  . ILD (interstitial lung disease) (HCC) 04/18/2020  . Acute respiratory failure with hypoxia (HCC) 04/02/2020  . Acute exacerbation of idiopathic pulmonary fibrosis (HCC) 04/02/2020  . Diastolic dysfunction 04/02/2020  . Pacemaker 10/01/2018  . Vitamin D deficiency 01/11/2018  . Allergic angioedema 03/17/2016  . Complete heart block (HCC) 02/26/2016  . Obesity (BMI 30-39.9) 04/28/2014  . Postinflammatory pulmonary fibrosis (HCC) 08/27/2010  . Former tobacco use 08/28/2008  . Hyperlipidemia LDL goal <100 06/21/2007  . GAD (generalized anxiety disorder) 06/05/2007  . Essential hypertension 06/05/2007   . ALLERGIC RHINITIS 06/05/2007    Past Surgical History:  Procedure Laterality Date  . COSMETIC SURGERY    . EP IMPLANTABLE DEVICE N/A 02/26/2016   Procedure: Pacemaker Implant;  Surgeon: Will Jorja Loa, MD;  Location: [facility] INVASIVE CV LAB;  Service: Cardiovascular;  Laterality: N/A;  . TUBAL LIGATION      Prior to Admission medications   Medication Sig Start Date End Date Taking? Authorizing Provider  Ascorbic Acid (VITAMIN C) 1000 MG tablet Take 1,000 mg by mouth daily.    [provider]  benzonatate (TESSALON) 200 MG capsule Take 1 capsule (200 mg total) by mouth 3 (three) times daily as needed for cough. 04/18/20   Coral Ceo, NP  carvedilol (COREG) 25 MG tablet TAKE ONE TABLET BY MOUTH TWICE A DAY WITH MEALS 02/29/20   Camnitz, Andree Coss, MD  cholecalciferol (VITAMIN D3) 25 MCG (1000 UT) tablet Take 1,000 Units by mouth daily.     [provider]  diazepam (VALIUM) 5 MG tablet TAKE ONE TABLET BY MOUTH EVERY 8 HOURS AS NEEDED FOR ANXIETY 05/14/20   Copland, Karleen Hampshire, MD  famotidine (PEPCID) 20 MG tablet Take 1 tablet (20 mg total) by mouth daily. 04/06/20   Lynn Ito, MD  hydrochlorothiazide (HYDRODIURIL) 12.5 MG tablet Take 1 tablet (12.5 mg total) by mouth daily. 04/05/20   Lynn Ito, MD  MAGNESIUM PO Take 1 tablet by mouth daily.    [provider]  oxyCODONE-acetaminophen (PERCOCET/ROXICET) 5-325 MG tablet Take 1 tablet by mouth every 6 (six) hours as needed for severe pain. 07/13/20  Sricharan Lacomb, Delorise Royals, PA-C  predniSONE (DELTASONE) 5 MG tablet Take 5mg  per day through mid-nov 2021 -> then 5mg  Monday, Wed, Friday -> till further notice 07/02/20   Friday, MD  PREDNISONE PO Take by mouth. Taking 30 mg for 14 days, then 20 mg for 14 days, and then 10 mg for 14 days.    [provider]  promethazine (PHENERGAN) 25 MG tablet Take 1 tablet (25 mg total) by mouth every 6 (six) hours as needed for nausea or vomiting. 07/13/20    Shawnita Krizek, Kalman Shan, PA-C  spironolactone (ALDACTONE) 25 MG tablet TAKE ONE TABLET BY MOUTH DAILY 07/02/20   Copland, Delorise Royals, MD  tamsulosin (FLOMAX) 0.4 MG CAPS capsule Take 1 capsule (0.4 mg total) by mouth daily. 07/13/20   Santiago Graf, Karleen Hampshire, PA-C  Zinc 30 MG TABS Take 30 mg by mouth daily.    [provider]    Allergies Codeine, Losartan potassium, Neomycin-polymyxin-dexameth, Oxycodone-acetaminophen, Paroxetine, Sulfonamide derivatives, Lisinopril, and Sulfa antibiotics  Family History  Problem Relation Age of Onset  . Anxiety disorder Mother   . Hypertension Mother   . Ovarian cancer Mother 30  . Cancer Maternal Aunt        breast  . Heart failure Maternal Grandfather     Social History Social History   Tobacco Use  . Smoking status: Former Smoker    Packs/day: 0.50    Years: 30.00    Pack years: 15.00    Quit date: 2012    Years since quitting: 9.8  . Smokeless tobacco: Never Used  Substance Use Topics  . Alcohol use: No  . Drug use: No     Review of Systems  Constitutional: No fever/chills Eyes: No visual changes. No discharge ENT: No upper respiratory complaints. Cardiovascular: no chest pain. Respiratory: no cough. No SOB. Gastrointestinal: No abdominal pain.  No nausea, no vomiting.  No diarrhea.  No constipation. Genitourinary: Negative for dysuria. No hematuria.  Positive for left flank pain Musculoskeletal: Negative for musculoskeletal pain. Skin: Negative for rash, abrasions, lacerations, ecchymosis. Neurological: Negative for headaches, focal weakness or numbness.  10 System ROS otherwise negative.  ____________________________________________   PHYSICAL EXAM:  VITAL SIGNS: ED Triage Vitals  Enc Vitals Group     BP 07/13/20 1208 (!) 142/76     Pulse Rate 07/13/20 1208 91     Resp 07/13/20 1208 18     Temp 07/13/20 1208 98.2 F (36.8 C)     Temp Source 07/13/20 1208 Oral     SpO2 07/13/20 1208 94 %     Weight 07/13/20  1207 215 lb (97.5 kg)     Height 07/13/20 1207 5\' 3"  (1.6 m)     Head Circumference --      Peak Flow --      Pain Score 07/13/20 1207 10     Pain Loc --      Pain Edu? --      Excl. in GC? --      Constitutional: Alert and oriented. Well appearing and in no acute distress. Eyes: Conjunctivae are normal. PERRL. EOMI. Head: Atraumatic. ENT:      Ears:       Nose: No congestion/rhinnorhea.      Mouth/Throat: Mucous membranes are moist.  Neck: No stridor.    Cardiovascular: Normal rate, regular rhythm. Normal S1 and S2.  Good peripheral circulation. Respiratory: Normal respiratory effort without tachypnea or retractions. Lungs CTAB. Good air entry to the bases with no decreased or absent  breath sounds. Gastrointestinal: No external abdominal wall findings.  Bowel sounds 4 quadrants.  Soft to palpation all quadrants.  Patient is mildly tender to palpation in the left lower quadrant.  This tenderness extends along the lateral abdominal wall towards the left flank.  No suprapubic tenderness to palpation.. No guarding or rigidity. No palpable masses. No distention.  Left-sided CVA tenderness. Musculoskeletal: Full range of motion to all extremities. No gross deformities appreciated. Neurologic:  Normal speech and language. No gross focal neurologic deficits are appreciated.  Skin:  Skin is warm, dry and intact. No rash noted. Psychiatric: Mood and affect are normal. Speech and behavior are normal. Patient exhibits appropriate insight and judgement.   ____________________________________________   LABS (all labs ordered are listed, but only abnormal results are displayed)  Labs Reviewed  URINALYSIS, COMPLETE (UACMP) WITH MICROSCOPIC - Abnormal; Notable for the following components:      Result Value   Color, Urine YELLOW (*)    APPearance CLEAR (*)    Ketones, ur 20 (*)    Leukocytes,Ua TRACE (*)    All other components within normal limits  CBC - Abnormal; Notable for the following  components:   WBC 14.7 (*)    All other components within normal limits  COMPREHENSIVE METABOLIC PANEL - Abnormal; Notable for the following components:   Glucose, Bld 138 (*)    Alkaline Phosphatase 37 (*)    Total Bilirubin 1.5 (*)    All other components within normal limits  LIPASE, BLOOD   ____________________________________________  EKG   ____________________________________________  RADIOLOGY I personally viewed and evaluated these images as part of my medical decision making, as well as reviewing the written report by the radiologist.  ED Provider Interpretation: I concur with radiologist finding of multiple nephrolithiasis noted left side, solitary nephrolithiasis right.  Patient has obstructing nephrolithiasis in the left ureter with left-sided renal edema.  Obstructing stone is 6 mm.  CT Renal Stone Study  Result Date: 07/13/2020 CLINICAL DATA:  Flank pain, kidney stone suspected. LEFT flank pain. EXAM: CT ABDOMEN AND PELVIS WITHOUT CONTRAST TECHNIQUE: Multidetector CT imaging of the abdomen and pelvis was performed following the standard protocol without IV contrast. COMPARISON:  None. FINDINGS: Lower chest: Lung bases are clear. Hepatobiliary: No focal hepatic lesion. No biliary duct dilatation. Common bile duct is normal. Pancreas: Pancreas is normal. No ductal dilatation. No pancreatic inflammation. Spleen: Normal spleen Adrenals/urinary tract: Adrenal glands normal. Moderate LEFT renal edema. Moderate perinephric stranding on the LEFT. Obstructing calculus at the LEFT ureteral pelvic junction measures 6 mm on image 42/series 2. Additional large calculus in the lower pole of the LEFT kidney measures 17 mm. Cluster of smaller 5 mm calculus also present lower pole the LEFT. No distal ureteral calculi on the LEFT. RIGHT kidney has a single 1 mm calculus. No RIGHT ureterolithiasis. No bladder calculi. Stomach/Bowel: Stomach, small bowel, appendix, and cecum are normal. The colon  and rectosigmoid colon are normal. Vascular/Lymphatic: Abdominal aorta is normal caliber with atherosclerotic calcification. There is no retroperitoneal or periportal lymphadenopathy. No pelvic lymphadenopathy. Reproductive: Uterus and adnexa unremarkable. Other: No free fluid. Musculoskeletal: No aggressive osseous lesion. IMPRESSION: 1. High-grade obstruction of the LEFT kidney with obstructing calculus at the LEFT ureteral pelvic junction. 2. Several additional calculi within lower pole of the LEFT kidney. 3. Single RIGHT renal calculus. Electronically Signed   By: Genevive Bi M.D.   On: 07/13/2020 15:57    ____________________________________________    PROCEDURES  Procedure(s) performed:    Procedures  Medications  ketorolac (TORADOL) 30 MG/ML injection 30 mg (has no administration in time range)  morphine 4 MG/ML injection 4 mg (has no administration in time range)  sodium chloride 0.9 % bolus 1,000 mL (1,000 mLs Intravenous New Bag/Given 07/13/20 1513)  morphine 4 MG/ML injection 4 mg (4 mg Intravenous Given 07/13/20 1515)  ondansetron (ZOFRAN) injection 4 mg (4 mg Intravenous Given 07/13/20 1516)     ____________________________________________   INITIAL IMPRESSION / ASSESSMENT AND PLAN / ED COURSE  Pertinent labs & imaging results that were available during my care of the patient were reviewed by me and considered in my medical decision making (see chart for details).  Review of the  [place] was performed in accordance of the [facility] prior to dispensing any controlled drugs.           Patient's diagnosis is consistent with left-sided nephrolithiasis.  Patient presented to emergency department sudden sharp onset of left flank pain.  Patient had nausea from the pain with one episode of emesis.  Otherwise no ongoing emesis.  No diarrhea constipation.  Patient denies any dysuria, polyuria or hematuria.  No history of nephrolithiasis.  Review of medical record revealed  no history of nephrolithiasis and no history of recurrent UTIs.  Differential included gastritis, diverticulitis, pyelonephritis, nephrolithiasis.  Labs were overall reassuring.  Urinalysis did not reveal any hematuria or leukocytes.  Given the patient's symptoms, CVA tenderness, CT scan was ordered.  Patient has multiple left-sided kidney stones with one obstructing in the left ureter.  At this time patient will have Toradol, no other dose of pain medication and will be discharged with prescriptions for Percocet, Phenergan and Flomax.  Patient has a history of allergic reaction in the medical chart with Percocet but reaction was not identified.  Discussed with the patient and her husband patient's allergic reaction.  She states that she felt "heavy, and off" when she took Percocet.  There was no immediate anaphylactic type reaction with Percocet usage.  There was no reported rash or hives.  At this time I discussed pain medications with the patient and she would like to try Percocet at home.  I have discussed allergic reaction symptoms with the patient and her husband and they agreed to return to the emergency department for any true allergic reaction-like symptoms.  Patient will follow up with urology for likely need of lithotripsy later this week.  Patient is given Toradol here lithotripsy service will be outside the 72-hour window here at [facility].  Patient will follow up with urology in the morning.  Return to the emergency department for any new or worsening symptoms.  Patient is given ED precautions to return to the ED for any worsening or new symptoms.     ____________________________________________  FINAL CLINICAL IMPRESSION(S) / ED DIAGNOSES  Final diagnoses:  Left nephrolithiasis      NEW MEDICATIONS STARTED DURING THIS VISIT:  ED Discharge Orders         Ordered    oxyCODONE-acetaminophen (PERCOCET/ROXICET) 5-325 MG tablet  Every 6 hours PRN        07/13/20 1643    promethazine  (PHENERGAN) 25 MG tablet  Every 6 hours PRN        07/13/20 1643    tamsulosin (FLOMAX) 0.4 MG CAPS capsule  Daily        07/13/20 1643              This chart was dictated using voice recognition software/Dragon. Despite best efforts to proofread, errors  can occur which can change the meaning. Any change was purely unintentional.    Racheal Patches, PA-C 07/13/20 1649    Jene Every, MD 07/17/20 [phone]

## 2020-07-13 NOTE — ED Triage Notes (Signed)
Pt in via EMS from home with c/o left flank pain that started at 10:30 last pm. Pt has vomited x's 1. Pt reports cannot get comfortable at all. 78 HR, 894% RA, 144/81, FSBS 186, #20 g to right AC and has been given 4mg  of zofran

## 2020-07-14 NOTE — Progress Notes (Signed)
07/15/2020 9:12 AM   Nat Math Kulig [birth year] [id number]  Referring provider: Owens Loffler, MD [place],  [place] [id number] No chief complaint on file.   HPI: Anne Rush is a 59 y.o. female who presents today for evaluation and management of nephrolithiasis. Patient was accompanied by her husband.   Patient presented to the ED yesterday for left flank pain. Pain radiated from left flank into left groin. She had associating nausea and 1 episode of clear emesis. Patient took tylenol at home. No dysuria or hematuria. Patient denied history of nephrolithiasis. No history of chronic GI complaints such as IBS, Crohn's, diverticulitis or diverticulosis.  UA noted 6-10 WBCs otherwise normal. CT renal stone study revealed high-grade obstruction of the LEFT kidney with obstructing calculus at the LEFT ureteral pelvic junction. Several additional calculi within lower pole of the LEFT kidney. Single RIGHT renal calculus. Patient was discharged on Percocet, Phenergan and Flomax.   She has not had any pain medication since Sunday. She reports emesis on Sunday morning. She has some flank pain yesterday. She notes that pain radiates to her lower back. Pain increases with movement. No fevers and chills. She reports feeling awful. Her husband reports that he oxygen levels have been lower. He reports that she has had decreased energy. She has a decreased appetite.  She has been more lethargic despite not having taken narcotics in over 48 hours.   PMH: Past Medical History:  Diagnosis Date  . Anxiety   . Family history of ovarian cancer   . Hyperlipidemia   . Hypertension   . Interstitial lung disease ([place])   . Pollen allergies   . Squamous cell carcinoma 2014   skin cancer  . Subclinical hyperthyroidism 06/17/2014    Surgical History: Past Surgical History:  Procedure Laterality Date  . COSMETIC SURGERY    . EP IMPLANTABLE DEVICE N/A 02/26/2016   Procedure: Pacemaker  Implant;  Surgeon: Will Meredith Leeds, MD;  Location: [facility];  Service: Cardiovascular;  Laterality: N/A;  . TUBAL LIGATION      Home Medications:  Allergies as of 07/15/2020      Reactions   Codeine Hives, Itching   Losartan Potassium Other (See Comments)   Neomycin-polymyxin-dexameth Itching, Swelling, Other (See Comments)   Swelling and itching of eye    Oxycodone-acetaminophen Other (See Comments)   REACTION: unspecified   Paroxetine Other (See Comments)   unknown   Sulfonamide Derivatives Other (See Comments)   REACTION: unspecified   Lisinopril Rash   Sulfa Antibiotics Hives, Rash         Medication List       Accurate as of July 15, 2020  9:12 AM. If you have any questions, ask your nurse or doctor.        benzonatate 200 MG capsule Commonly known as: TESSALON Take 1 capsule (200 mg total) by mouth 3 (three) times daily as needed for cough.   carvedilol 25 MG tablet Commonly known as: COREG TAKE ONE TABLET BY MOUTH TWICE A DAY WITH MEALS   cholecalciferol 25 MCG (1000 UNIT) tablet Commonly known as: VITAMIN D3 Take 1,000 Units by mouth daily.   diazepam 5 MG tablet Commonly known as: VALIUM TAKE ONE TABLET BY MOUTH EVERY 8 HOURS AS NEEDED FOR ANXIETY   famotidine 20 MG tablet Commonly known as: PEPCID Take 1 tablet (20 mg total) by mouth daily.   hydrochlorothiazide 12.5 MG tablet Commonly known as: HYDRODIURIL Take 1 tablet (12.5  mg total) by mouth daily.   MAGNESIUM PO Take 1 tablet by mouth daily.   nortriptyline 10 MG capsule Commonly known as: PAMELOR TAKE ONE CAPSULE BY MOUTH ONCE NIGHTLY   oxyCODONE-acetaminophen 5-325 MG tablet Commonly known as: PERCOCET/ROXICET Take 1 tablet by mouth every 6 (six) hours as needed for severe pain.   predniSONE 5 MG tablet Commonly known as: DELTASONE Take 58m per day through mid-nov 2021 -> then 541mMonday, Wed, Friday -> till further notice What changed: Another medication with the  same name was removed. Continue taking this medication, and follow the directions you see here. Changed by: AsHollice EspyMD   promethazine 25 MG tablet Commonly known as: PHENERGAN Take 1 tablet (25 mg total) by mouth every 6 (six) hours as needed for nausea or vomiting.   spironolactone 25 MG tablet Commonly known as: ALDACTONE TAKE ONE TABLET BY MOUTH DAILY   tamsulosin 0.4 MG Caps capsule Commonly known as: FLOMAX Take 1 capsule (0.4 mg total) by mouth daily.   vitamin C 1000 MG tablet Take 1,000 mg by mouth daily.   Zinc 30 MG Tabs Take 30 mg by mouth daily.       Allergies:  Allergies  Allergen Reactions  . Codeine Hives and Itching  . Losartan Potassium Other (See Comments)  . Neomycin-Polymyxin-Dexameth Itching, Swelling and Other (See Comments)    Swelling and itching of eye   . Oxycodone-Acetaminophen Other (See Comments)    REACTION: unspecified  . Paroxetine Other (See Comments)    unknown  . Sulfonamide Derivatives Other (See Comments)    REACTION: unspecified  . Lisinopril Rash  . Sulfa Antibiotics Hives and Rash         Family History: Family History  Problem Relation Age of Onset  . Anxiety disorder Mother   . Hypertension Mother   . Ovarian cancer Mother [id number]. Cancer Maternal Aunt        breast  . Heart failure Maternal Grandfather     Social History:  reports that she quit smoking about 9 years ago. She has a 15.00 pack-year smoking history. She has never used smokeless tobacco. She reports that she does not drink alcohol and does not use drugs.   Physical Exam: There were no vitals taken for this visit.  Constitutional:  No acute distress. Appears groggy and ill. IN wheelchair. Wearing oxygen. HEENT: [place] AT, moist mucus membranes.  Trachea midline, no masses. Cardiovascular: No clubbing, cyanosis, or edema. Respiratory: Normal respiratory effort, no increased work of breathing. Skin: No rashes, bruises or suspicious lesions. Pale  skin. Neurologic: Grossly intact, no focal deficits, moving all 4 extremities. Psychiatric: Altered mental status.  Laboratory Data:  Lab Results  Component Value Date   CREATININE 0.84 07/13/2020    Lab Results  Component Value Date   HGBA1C 5.6 04/05/2020    =  Pertinent Imaging:  Results for orders placed during the hospital encounter of 07/13/20  CT Renal Stone Study  Narrative CLINICAL DATA:  Flank pain, kidney stone suspected. LEFT flank pain.  EXAM: CT ABDOMEN AND PELVIS WITHOUT CONTRAST  TECHNIQUE: Multidetector CT imaging of the abdomen and pelvis was performed following the standard protocol without IV contrast.  COMPARISON:  None.  FINDINGS: Lower chest: Lung bases are clear.  Hepatobiliary: No focal hepatic lesion. No biliary duct dilatation. Common bile duct is normal.  Pancreas: Pancreas is normal. No ductal dilatation. No pancreatic inflammation.  Spleen: Normal spleen  Adrenals/urinary tract: Adrenal glands normal.  Moderate  LEFT renal edema. Moderate perinephric stranding on the LEFT. Obstructing calculus at the LEFT ureteral pelvic junction measures 6 mm on image 42/series 2.  Additional large calculus in the lower pole of the LEFT kidney measures 17 mm. Cluster of smaller 5 mm calculus also present lower pole the LEFT.  No distal ureteral calculi on the LEFT.  RIGHT kidney has a single 1 mm calculus. No RIGHT ureterolithiasis. No bladder calculi.  Stomach/Bowel: Stomach, small bowel, appendix, and cecum are normal. The colon and rectosigmoid colon are normal.  Vascular/Lymphatic: Abdominal aorta is normal caliber with atherosclerotic calcification. There is no retroperitoneal or periportal lymphadenopathy. No pelvic lymphadenopathy.  Reproductive: Uterus and adnexa unremarkable.  Other: No free fluid.  Musculoskeletal: No aggressive osseous lesion.  IMPRESSION: 1. High-grade obstruction of the LEFT kidney with  obstructing calculus at the LEFT ureteral pelvic junction. 2. Several additional calculi within lower pole of the LEFT kidney. 3. Single RIGHT renal calculus.   Electronically Signed By: Suzy Bouchard M.D. On: 07/13/2020 15:57  I have personally reviewed the images and agree with radiologist interpretation. Stone is measuring 750 HFU.     Assessment & Plan:    1.  Left obstructing ureteral calculus/concern for sepsis of urinary source  Patient appears extremely unwell today in clinic, sick appearance, tachypneic, lethargic with very high concern for sepsis of urinary source.  I believe she needs emergency attention and urgent ureteral stenting for source control.  Patient has multiple significant comorbidities.   We discussed need for intervention with ureteral stent by myself or my partner Dr. Bernardo Heater. I am concerned she may have an infection behind her kidneys, patient will need MAC care or spinal given her cardiac and pulmonary comorbidities including interstitial lung disease with increased oxygen requirement.  I recommend patient to go to the ER and be admitted for treatment.   We discussed various treatment options including ESWL vs. ureteroscopy, laser lithotripsy, and stent on a late date.  And expect was called into the ER with my concerns, requesting labs after triage, admission and plan for urgent ureteral stenting.  Have also talked to my partner Dr. Bernardo Heater who is on-call today for urology and either myself or her he will place a ureteral stent sooner she is cleared to do so.  Last ate wheat toast at 7 AM.  Advised to remain n.p.o.  [facility] [address], [place], [place] [address] [phone]  I, Selena Batten, am acting as a scribe for Dr. Hollice Espy.  I have reviewed the above documentation for accuracy and completeness, and I agree with the above.   Hollice Espy, MD  I spent 60 total minutes on the day of  the encounter including pre-visit review of the medical record, face-to-face time with the patient, and post visit ordering of labs/imaging/tests.  Acute severe medical illness requiring emergent procedure.

## 2020-07-15 ENCOUNTER — Inpatient Hospital Stay
Admission: EM | Admit: 2020-07-15 | Discharge: 2020-07-20 | DRG: 853 | Disposition: A | Discharge status: Home / Self Care | Payer: BC Managed Care – PPO | Source: Ambulatory Visit | Attending: Internal Medicine | Admitting: Internal Medicine

## 2020-07-15 ENCOUNTER — Inpatient Hospital Stay: Payer: BC Managed Care – PPO | Admitting: Anesthesiology

## 2020-07-15 ENCOUNTER — Emergency Department: Payer: BC Managed Care – PPO

## 2020-07-15 ENCOUNTER — Ambulatory Visit (INDEPENDENT_AMBULATORY_CARE_PROVIDER_SITE_OTHER): Payer: BC Managed Care – PPO | Admitting: Urology

## 2020-07-15 ENCOUNTER — Encounter: Payer: Self-pay | Admitting: Emergency Medicine

## 2020-07-15 ENCOUNTER — Encounter
Admission: EM | Disposition: A | Discharge status: Home / Self Care | Payer: Self-pay | Source: Home / Self Care | Attending: Internal Medicine

## 2020-07-15 ENCOUNTER — Other Ambulatory Visit: Payer: Self-pay

## 2020-07-15 ENCOUNTER — Inpatient Hospital Stay: Payer: BC Managed Care – PPO

## 2020-07-15 VITALS — BP 120/70 | HR 70

## 2020-07-15 DIAGNOSIS — I951 Orthostatic hypotension: Secondary | ICD-10-CM | POA: Diagnosis not present

## 2020-07-15 DIAGNOSIS — Z888 Allergy status to other drugs, medicaments and biological substances status: Secondary | ICD-10-CM

## 2020-07-15 DIAGNOSIS — J988 Other specified respiratory disorders: Secondary | ICD-10-CM | POA: Diagnosis not present

## 2020-07-15 DIAGNOSIS — N138 Other obstructive and reflux uropathy: Secondary | ICD-10-CM

## 2020-07-15 DIAGNOSIS — R778 Other specified abnormalities of plasma proteins: Secondary | ICD-10-CM | POA: Diagnosis present

## 2020-07-15 DIAGNOSIS — R55 Syncope and collapse: Secondary | ICD-10-CM | POA: Diagnosis not present

## 2020-07-15 DIAGNOSIS — I10 Essential (primary) hypertension: Secondary | ICD-10-CM | POA: Diagnosis not present

## 2020-07-15 DIAGNOSIS — N200 Calculus of kidney: Secondary | ICD-10-CM

## 2020-07-15 DIAGNOSIS — N202 Calculus of kidney with calculus of ureter: Secondary | ICD-10-CM | POA: Diagnosis present

## 2020-07-15 DIAGNOSIS — Z8249 Family history of ischemic heart disease and other diseases of the circulatory system: Secondary | ICD-10-CM

## 2020-07-15 DIAGNOSIS — N136 Pyonephrosis: Secondary | ICD-10-CM | POA: Diagnosis present

## 2020-07-15 DIAGNOSIS — D6959 Other secondary thrombocytopenia: Secondary | ICD-10-CM | POA: Diagnosis present

## 2020-07-15 DIAGNOSIS — Z882 Allergy status to sulfonamides status: Secondary | ICD-10-CM

## 2020-07-15 DIAGNOSIS — E876 Hypokalemia: Secondary | ICD-10-CM | POA: Diagnosis present

## 2020-07-15 DIAGNOSIS — G9341 Metabolic encephalopathy: Secondary | ICD-10-CM | POA: Diagnosis present

## 2020-07-15 DIAGNOSIS — N179 Acute kidney failure, unspecified: Secondary | ICD-10-CM | POA: Diagnosis present

## 2020-07-15 DIAGNOSIS — I442 Atrioventricular block, complete: Secondary | ICD-10-CM | POA: Diagnosis present

## 2020-07-15 DIAGNOSIS — Z87891 Personal history of nicotine dependence: Secondary | ICD-10-CM

## 2020-07-15 DIAGNOSIS — K219 Gastro-esophageal reflux disease without esophagitis: Secondary | ICD-10-CM | POA: Diagnosis present

## 2020-07-15 DIAGNOSIS — J849 Interstitial pulmonary disease, unspecified: Secondary | ICD-10-CM | POA: Diagnosis present

## 2020-07-15 DIAGNOSIS — I5022 Chronic systolic (congestive) heart failure: Secondary | ICD-10-CM | POA: Diagnosis present

## 2020-07-15 DIAGNOSIS — Z7952 Long term (current) use of systemic steroids: Secondary | ICD-10-CM

## 2020-07-15 DIAGNOSIS — E559 Vitamin D deficiency, unspecified: Secondary | ICD-10-CM | POA: Diagnosis present

## 2020-07-15 DIAGNOSIS — A419 Sepsis, unspecified organism: Secondary | ICD-10-CM

## 2020-07-15 DIAGNOSIS — N132 Hydronephrosis with renal and ureteral calculous obstruction: Secondary | ICD-10-CM

## 2020-07-15 DIAGNOSIS — E871 Hypo-osmolality and hyponatremia: Secondary | ICD-10-CM | POA: Diagnosis present

## 2020-07-15 DIAGNOSIS — J84112 Idiopathic pulmonary fibrosis: Secondary | ICD-10-CM | POA: Diagnosis present

## 2020-07-15 DIAGNOSIS — R6520 Severe sepsis without septic shock: Secondary | ICD-10-CM | POA: Diagnosis present

## 2020-07-15 DIAGNOSIS — D696 Thrombocytopenia, unspecified: Secondary | ICD-10-CM | POA: Diagnosis not present

## 2020-07-15 DIAGNOSIS — Z885 Allergy status to narcotic agent status: Secondary | ICD-10-CM

## 2020-07-15 DIAGNOSIS — B964 Proteus (mirabilis) (morganii) as the cause of diseases classified elsewhere: Secondary | ICD-10-CM | POA: Diagnosis present

## 2020-07-15 DIAGNOSIS — Z950 Presence of cardiac pacemaker: Secondary | ICD-10-CM

## 2020-07-15 DIAGNOSIS — Z20822 Contact with and (suspected) exposure to covid-19: Secondary | ICD-10-CM | POA: Diagnosis present

## 2020-07-15 DIAGNOSIS — R7989 Other specified abnormal findings of blood chemistry: Secondary | ICD-10-CM | POA: Diagnosis present

## 2020-07-15 DIAGNOSIS — Z79899 Other long term (current) drug therapy: Secondary | ICD-10-CM

## 2020-07-15 DIAGNOSIS — Z85828 Personal history of other malignant neoplasm of skin: Secondary | ICD-10-CM

## 2020-07-15 DIAGNOSIS — N10 Acute tubulo-interstitial nephritis: Secondary | ICD-10-CM | POA: Diagnosis present

## 2020-07-15 DIAGNOSIS — E785 Hyperlipidemia, unspecified: Secondary | ICD-10-CM | POA: Diagnosis present

## 2020-07-15 DIAGNOSIS — R748 Abnormal levels of other serum enzymes: Secondary | ICD-10-CM | POA: Diagnosis not present

## 2020-07-15 DIAGNOSIS — A4159 Other Gram-negative sepsis: Secondary | ICD-10-CM | POA: Diagnosis present

## 2020-07-15 DIAGNOSIS — I110 Hypertensive heart disease with heart failure: Secondary | ICD-10-CM | POA: Diagnosis present

## 2020-07-15 DIAGNOSIS — F419 Anxiety disorder, unspecified: Secondary | ICD-10-CM | POA: Diagnosis present

## 2020-07-15 DIAGNOSIS — F411 Generalized anxiety disorder: Secondary | ICD-10-CM | POA: Diagnosis present

## 2020-07-15 DIAGNOSIS — N390 Urinary tract infection, site not specified: Secondary | ICD-10-CM | POA: Diagnosis not present

## 2020-07-15 DIAGNOSIS — R7881 Bacteremia: Secondary | ICD-10-CM | POA: Diagnosis not present

## 2020-07-15 HISTORY — PX: CYSTOSCOPY W/ URETERAL STENT PLACEMENT: SHX1429

## 2020-07-15 HISTORY — DX: Chronic systolic (congestive) heart failure: I50.22

## 2020-07-15 HISTORY — DX: Generalized anxiety disorder: F41.1

## 2020-07-15 LAB — BASIC METABOLIC PANEL
Anion gap: 10 (ref 5–15)
Anion gap: 11 (ref 5–15)
BUN: 28 mg/dL — ABNORMAL HIGH (ref 6–20)
BUN: 30 mg/dL — ABNORMAL HIGH (ref 6–20)
CO2: 22 mmol/L (ref 22–32)
CO2: 22 mmol/L (ref 22–32)
Calcium: 8.2 mg/dL — ABNORMAL LOW (ref 8.9–10.3)
Calcium: 8.3 mg/dL — ABNORMAL LOW (ref 8.9–10.3)
Chloride: 98 mmol/L (ref 98–111)
Chloride: 99 mmol/L (ref 98–111)
Creatinine, Ser: 1.2 mg/dL — ABNORMAL HIGH (ref 0.44–1.00)
Creatinine, Ser: 1.21 mg/dL — ABNORMAL HIGH (ref 0.44–1.00)
GFR, Estimated: 53 mL/min — ABNORMAL LOW (ref >60)
GFR, Estimated: 52 mL/min — ABNORMAL LOW (ref >60)
Glucose, Bld: 133 mg/dL — ABNORMAL HIGH (ref 70–99)
Glucose, Bld: 162 mg/dL — ABNORMAL HIGH (ref 70–99)
Potassium: 3.4 mmol/L — ABNORMAL LOW (ref 3.5–5.1)
Potassium: 3.4 mmol/L — ABNORMAL LOW (ref 3.5–5.1)
Sodium: 130 mmol/L — ABNORMAL LOW (ref 135–145)
Sodium: 132 mmol/L — ABNORMAL LOW (ref 135–145)

## 2020-07-15 LAB — URINALYSIS, COMPLETE (UACMP) WITH MICROSCOPIC
Bilirubin Urine: NEGATIVE
Glucose, UA: NEGATIVE mg/dL
Ketones, ur: NEGATIVE mg/dL
Nitrite: NEGATIVE
Protein, ur: 100 mg/dL — AB
Specific Gravity, Urine: 1.024 (ref 1.005–1.030)
pH: 5 (ref 5.0–8.0)

## 2020-07-15 LAB — CBC WITH DIFFERENTIAL/PLATELET
Abs Immature Granulocytes: 0.26 10*3/uL — ABNORMAL HIGH (ref 0.00–0.07)
Basophils Absolute: 0.1 10*3/uL (ref 0.0–0.1)
Basophils Relative: 0 %
Eosinophils Absolute: 0 10*3/uL (ref 0.0–0.5)
Eosinophils Relative: 0 %
HCT: 35.5 % — ABNORMAL LOW (ref 36.0–46.0)
Hemoglobin: 12.7 g/dL (ref 12.0–15.0)
Immature Granulocytes: 2 %
Lymphocytes Relative: 4 %
Lymphs Abs: 0.6 10*3/uL — ABNORMAL LOW (ref 0.7–4.0)
MCH: 31.9 pg (ref 26.0–34.0)
MCHC: 35.8 g/dL (ref 30.0–36.0)
MCV: 89.2 fL (ref 80.0–100.0)
Monocytes Absolute: 0.9 10*3/uL (ref 0.1–1.0)
Monocytes Relative: 6 %
Neutro Abs: 14.2 10*3/uL — ABNORMAL HIGH (ref 1.7–7.7)
Neutrophils Relative %: 88 %
Platelets: 92 10*3/uL — ABNORMAL LOW (ref 150–400)
RBC: 3.98 MIL/uL (ref 3.87–5.11)
RDW: 12.5 % (ref 11.5–15.5)
Smear Review: NORMAL
WBC: 16 10*3/uL — ABNORMAL HIGH (ref 4.0–10.5)
nRBC: 0 % (ref 0.0–0.2)

## 2020-07-15 LAB — COMPREHENSIVE METABOLIC PANEL
ALT: 15 U/L (ref 0–44)
AST: 26 U/L (ref 15–41)
Albumin: 3.4 g/dL — ABNORMAL LOW (ref 3.5–5.0)
Alkaline Phosphatase: 51 U/L (ref 38–126)
Anion gap: 12 (ref 5–15)
BUN: 28 mg/dL — ABNORMAL HIGH (ref 6–20)
CO2: 21 mmol/L — ABNORMAL LOW (ref 22–32)
Calcium: 8.6 mg/dL — ABNORMAL LOW (ref 8.9–10.3)
Chloride: 93 mmol/L — ABNORMAL LOW (ref 98–111)
Creatinine, Ser: 1.73 mg/dL — ABNORMAL HIGH (ref 0.44–1.00)
GFR, Estimated: 34 mL/min — ABNORMAL LOW (ref >60)
Glucose, Bld: 163 mg/dL — ABNORMAL HIGH (ref 70–99)
Potassium: 3.4 mmol/L — ABNORMAL LOW (ref 3.5–5.1)
Sodium: 126 mmol/L — ABNORMAL LOW (ref 135–145)
Total Bilirubin: 1.7 mg/dL — ABNORMAL HIGH (ref 0.3–1.2)
Total Protein: 6.8 g/dL (ref 6.5–8.1)

## 2020-07-15 LAB — RESPIRATORY PANEL BY RT PCR (FLU A&B, COVID)
Influenza A by PCR: NEGATIVE
Influenza B by PCR: NEGATIVE
SARS Coronavirus 2 by RT PCR: NEGATIVE

## 2020-07-15 LAB — LACTIC ACID, PLASMA
Lactic Acid, Venous: 2.4 mmol/L (ref 0.5–1.9)
Lactic Acid, Venous: 2.5 mmol/L (ref 0.5–1.9)
Lactic Acid, Venous: 2.2 mmol/L (ref 0.5–1.9)

## 2020-07-15 LAB — LACTATE DEHYDROGENASE: LDH: 190 U/L (ref 98–192)

## 2020-07-15 LAB — PROTIME-INR
INR: 1.2 (ref 0.8–1.2)
Prothrombin Time: 14.8 seconds (ref 11.4–15.2)

## 2020-07-15 LAB — OSMOLALITY: Osmolality: 277 mOsm/kg (ref 275–295)

## 2020-07-15 LAB — TROPONIN I (HIGH SENSITIVITY)
Troponin I (High Sensitivity): 37 ng/L — ABNORMAL HIGH (ref <18)
Troponin I (High Sensitivity): 28 ng/L — ABNORMAL HIGH (ref <18)
Troponin I (High Sensitivity): 53 ng/L — ABNORMAL HIGH (ref <18)
Troponin I (High Sensitivity): 59 ng/L — ABNORMAL HIGH (ref <18)
Troponin I (High Sensitivity): 40 ng/L — ABNORMAL HIGH (ref <18)

## 2020-07-15 LAB — SAVE SMEAR(SSMR), FOR PROVIDER SLIDE REVIEW: Smear Review: NONE SEEN

## 2020-07-15 LAB — MAGNESIUM: Magnesium: 1.5 mg/dL — ABNORMAL LOW (ref 1.7–2.4)

## 2020-07-15 LAB — BRAIN NATRIURETIC PEPTIDE: B Natriuretic Peptide: 171.5 pg/mL — ABNORMAL HIGH (ref 0.0–100.0)

## 2020-07-15 LAB — PHOSPHORUS: Phosphorus: 2 mg/dL — ABNORMAL LOW (ref 2.5–4.6)

## 2020-07-15 LAB — PROCALCITONIN: Procalcitonin: 48.18 ng/mL

## 2020-07-15 LAB — APTT: aPTT: 40 seconds — ABNORMAL HIGH (ref 24–36)

## 2020-07-15 SURGERY — CYSTOSCOPY, WITH RETROGRADE PYELOGRAM AND URETERAL STENT INSERTION
Site: Ureter | Laterality: Left

## 2020-07-15 MED ORDER — LIDOCAINE HCL (PF) 2 % IJ SOLN
INTRAMUSCULAR | Status: AC
  Filled 2020-07-15: qty 5

## 2020-07-15 MED ORDER — ASCORBIC ACID 500 MG PO TABS
1000.0000 mg | ORAL_TABLET | Freq: Every day | ORAL | Status: DC
  Administered 2020-07-16: 1000 mg via ORAL
  Filled 2020-07-15 (×2): qty 2

## 2020-07-15 MED ORDER — LIDOCAINE HCL URETHRAL/MUCOSAL 2 % EX GEL
CUTANEOUS | Status: DC | PRN
  Administered 2020-07-15: 1 via TOPICAL

## 2020-07-15 MED ORDER — DIAZEPAM 5 MG PO TABS
5.0000 mg | ORAL_TABLET | Freq: Three times a day (TID) | ORAL | Status: DC | PRN
  Administered 2020-07-18 – 2020-07-19 (×2): 5 mg via ORAL
  Filled 2020-07-15 (×2): qty 1

## 2020-07-15 MED ORDER — FAMOTIDINE 20 MG PO TABS
20.0000 mg | ORAL_TABLET | Freq: Every day | ORAL | Status: DC
  Administered 2020-07-16 – 2020-07-20 (×5): 20 mg via ORAL
  Filled 2020-07-15 (×5): qty 1

## 2020-07-15 MED ORDER — DEXMEDETOMIDINE HCL 200 MCG/2ML IV SOLN
INTRAVENOUS | Status: DC | PRN
  Administered 2020-07-15: 4 ug via INTRAVENOUS
  Administered 2020-07-15 (×2): 2 ug via INTRAVENOUS
  Administered 2020-07-15 (×3): 4 ug via INTRAVENOUS
  Administered 2020-07-15 (×2): 2 ug via INTRAVENOUS

## 2020-07-15 MED ORDER — PREDNISONE 10 MG PO TABS
15.0000 mg | ORAL_TABLET | Freq: Every day | ORAL | Status: DC
  Administered 2020-07-16 – 2020-07-19 (×4): 15 mg via ORAL
  Filled 2020-07-15 (×4): qty 2

## 2020-07-15 MED ORDER — FENTANYL CITRATE (PF) 100 MCG/2ML IJ SOLN
50.0000 ug | Freq: Once | INTRAMUSCULAR | Status: AC
  Administered 2020-07-15: 50 ug via INTRAVENOUS
  Filled 2020-07-15: qty 2

## 2020-07-15 MED ORDER — ZINC 30 MG PO TABS: 30.0000 mg | ORAL_TABLET | Freq: Every day | ORAL | Status: DC

## 2020-07-15 MED ORDER — FENTANYL CITRATE (PF) 100 MCG/2ML IJ SOLN
INTRAMUSCULAR | Status: AC
  Filled 2020-07-15: qty 2

## 2020-07-15 MED ORDER — ACETAMINOPHEN 10 MG/ML IV SOLN: 1000.0000 mg | Freq: Once | INTRAVENOUS | Status: AC

## 2020-07-15 MED ORDER — FENTANYL CITRATE (PF) 100 MCG/2ML IJ SOLN
INTRAMUSCULAR | Status: AC
  Administered 2020-07-15: 25 ug via INTRAVENOUS
  Filled 2020-07-15: qty 2

## 2020-07-15 MED ORDER — IOHEXOL 180 MG/ML  SOLN
INTRAMUSCULAR | Status: DC | PRN
  Administered 2020-07-15: 20 mL

## 2020-07-15 MED ORDER — VITAMIN D 25 MCG (1000 UNIT) PO TABS
1000.0000 [IU] | ORAL_TABLET | Freq: Every day | ORAL | Status: DC
  Administered 2020-07-16: 1000 [IU] via ORAL
  Filled 2020-07-15 (×2): qty 1

## 2020-07-15 MED ORDER — ONDANSETRON HCL 4 MG/2ML IJ SOLN: 4.0000 mg | Freq: Once | INTRAMUSCULAR | Status: DC | PRN

## 2020-07-15 MED ORDER — FENTANYL CITRATE (PF) 100 MCG/2ML IJ SOLN: 25.0000 ug | INTRAMUSCULAR | Status: DC | PRN

## 2020-07-15 MED ORDER — POTASSIUM CHLORIDE CRYS ER 20 MEQ PO TBCR
40.0000 meq | EXTENDED_RELEASE_TABLET | Freq: Once | ORAL | Status: DC
  Filled 2020-07-15: qty 2

## 2020-07-15 MED ORDER — SODIUM CHLORIDE 0.9 % IV SOLN: INTRAVENOUS | Status: DC

## 2020-07-15 MED ORDER — ALBUTEROL SULFATE HFA 108 (90 BASE) MCG/ACT IN AERS
2.0000 | INHALATION_SPRAY | RESPIRATORY_TRACT | Status: DC | PRN
  Filled 2020-07-15: qty 6.7

## 2020-07-15 MED ORDER — PROPOFOL 10 MG/ML IV BOLUS
INTRAVENOUS | Status: DC | PRN
  Administered 2020-07-15: 5 mg via INTRAVENOUS

## 2020-07-15 MED ORDER — SODIUM CHLORIDE 0.9 % IV SOLN
1.0000 g | INTRAVENOUS | Status: DC
  Administered 2020-07-15: 1 g via INTRAVENOUS
  Filled 2020-07-15 (×2): qty 10

## 2020-07-15 MED ORDER — LIDOCAINE HCL URETHRAL/MUCOSAL 2 % EX GEL
CUTANEOUS | Status: AC
  Filled 2020-07-15: qty 10

## 2020-07-15 MED ORDER — ACETAMINOPHEN 325 MG PO TABS
650.0000 mg | ORAL_TABLET | Freq: Four times a day (QID) | ORAL | Status: DC | PRN
  Administered 2020-07-16: 650 mg via ORAL
  Filled 2020-07-15: qty 2

## 2020-07-15 MED ORDER — NORTRIPTYLINE HCL 10 MG PO CAPS
10.0000 mg | ORAL_CAPSULE | Freq: Every day | ORAL | Status: DC
  Administered 2020-07-15: 10 mg via ORAL
  Filled 2020-07-15 (×5): qty 1

## 2020-07-15 MED ORDER — MAGNESIUM OXIDE 400 (241.3 MG) MG PO TABS
400.0000 mg | ORAL_TABLET | Freq: Every day | ORAL | Status: DC
  Administered 2020-07-16: 400 mg via ORAL
  Filled 2020-07-15 (×2): qty 1

## 2020-07-15 MED ORDER — MIDAZOLAM HCL 2 MG/2ML IJ SOLN
INTRAMUSCULAR | Status: DC | PRN
  Administered 2020-07-15 (×4): .5 mg via INTRAVENOUS

## 2020-07-15 MED ORDER — PROPOFOL 10 MG/ML IV BOLUS
INTRAVENOUS | Status: AC
  Filled 2020-07-15: qty 20

## 2020-07-15 MED ORDER — MIDAZOLAM HCL 2 MG/2ML IJ SOLN
INTRAMUSCULAR | Status: AC
  Filled 2020-07-15: qty 2

## 2020-07-15 MED ORDER — ZINC SULFATE 220 (50 ZN) MG PO CAPS
220.0000 mg | ORAL_CAPSULE | Freq: Every day | ORAL | Status: DC
  Administered 2020-07-16: 220 mg via ORAL
  Filled 2020-07-15 (×2): qty 1

## 2020-07-15 MED ORDER — PREDNISONE 50 MG PO TABS
50.0000 mg | ORAL_TABLET | Freq: Once | ORAL | Status: DC
  Filled 2020-07-15: qty 2

## 2020-07-15 MED ORDER — ACETAMINOPHEN 10 MG/ML IV SOLN
INTRAVENOUS | Status: AC
  Administered 2020-07-15: 1000 mg via INTRAVENOUS
  Filled 2020-07-15: qty 100

## 2020-07-15 MED ORDER — ONDANSETRON HCL 4 MG/2ML IJ SOLN
4.0000 mg | Freq: Three times a day (TID) | INTRAMUSCULAR | Status: DC | PRN
  Administered 2020-07-18 – 2020-07-19 (×2): 4 mg via INTRAVENOUS
  Filled 2020-07-15 (×2): qty 2

## 2020-07-15 MED ORDER — MORPHINE SULFATE (PF) 2 MG/ML IV SOLN
1.0000 mg | INTRAVENOUS | Status: DC | PRN
  Administered 2020-07-16 (×2): 1 mg via INTRAVENOUS
  Filled 2020-07-15 (×3): qty 1

## 2020-07-15 MED ORDER — SODIUM CHLORIDE 0.9 % IV BOLUS
1000.0000 mL | Freq: Once | INTRAVENOUS | Status: AC
  Administered 2020-07-15: 1000 mL via INTRAVENOUS

## 2020-07-15 MED ORDER — DM-GUAIFENESIN ER 30-600 MG PO TB12: 1.0000 | ORAL_TABLET | Freq: Two times a day (BID) | ORAL | Status: DC | PRN

## 2020-07-15 SURGICAL SUPPLY — 22 items
BAG DRAIN CYSTO-URO LG1000N (MISCELLANEOUS) ×3 IMPLANT
BAG DRN RND TRDRP ANRFLXCHMBR (UROLOGICAL SUPPLIES) ×2
BAG URINE DRAIN 2000ML AR STRL (UROLOGICAL SUPPLIES) ×3 IMPLANT
BRUSH SCRUB EZ 1% IODOPHOR (MISCELLANEOUS) ×3 IMPLANT
CATH FOL 2WAY LX 16X30 (CATHETERS) ×3 IMPLANT
CATH URETL 5X70 OPEN END (CATHETERS) ×3 IMPLANT
GLOVE BIO SURGEON STRL SZ 6.5 (GLOVE) ×3 IMPLANT
GOWN STRL REUS W/ TWL LRG LVL3 (GOWN DISPOSABLE) ×4 IMPLANT
GOWN STRL REUS W/TWL LRG LVL3 (GOWN DISPOSABLE) ×6
GUIDEWIRE STR DUAL SENSOR (WIRE) ×3 IMPLANT
KIT TURNOVER CYSTO (KITS) ×3 IMPLANT
PACK CYSTO AR (MISCELLANEOUS) ×3 IMPLANT
SET CYSTO W/LG BORE CLAMP LF (SET/KITS/TRAYS/PACK) ×3 IMPLANT
SOL .9 NS 3000ML IRR  AL (IV SOLUTION) ×3
SOL .9 NS 3000ML IRR AL (IV SOLUTION) ×2
SOL .9 NS 3000ML IRR UROMATIC (IV SOLUTION) ×2 IMPLANT
STENT URET 6FRX24 CONTOUR (STENTS) IMPLANT
STENT URET 6FRX26 CONTOUR (STENTS) IMPLANT
SURGILUBE 2OZ TUBE FLIPTOP (MISCELLANEOUS) ×3 IMPLANT
SYR TOOMEY IRRIG 70ML (MISCELLANEOUS) ×3
SYRINGE TOOMEY IRRIG 70ML (MISCELLANEOUS) ×2 IMPLANT
WATER STERILE IRR 1000ML POUR (IV SOLUTION) ×3 IMPLANT

## 2020-07-15 NOTE — Op Note (Signed)
Preoperative diagnosis:  1. Left proximal ureteral calculus with obstruction 2. Pyelonephritis with sepsis  Postoperative diagnosis:  1. Same  Procedure:  1. Cystoscopy 2. Left ureteral stent placement (6FR/24 cm) 3. Left retrograde pyelography with interpretation   Surgeon: Nicki Reaper C. Hamza Empson, M.D.  Anesthesia: MAC  Complications: None  Intraoperative findings:  1. Cystoscopy-cystocele with bladder descensus, bladder mucosa without erythema, solid or papillary lesions 2. Left retrograde pyelogram-moderate left hydronephrosis with proximal hydroureter secondary to proximal ureteral calculus 3. Urine aspirated from left renal pelvis murky and blood-tinged  EBL: Minimal  Specimens: Urine from left renal pelvis for culture and sensitivity  Indication: Anne Rush is a 59 y.o. female seen in clinic this morning by Dr. Erlene Quan in follow-up for an ED visit on 10/24 for left renal colic secondary to a left proximal ureteral calculus.  This morning she was complaining of continued pain, malaise and decreased oxygen levels as she is on intermittent home O2 for interstitial lung disease.  Clinical picture was concerning for sepsis secondary to an obstructing stone.  She has been admitted to the hospitalist service and presents for urgent placement of a left ureteral stent.  After reviewing the management options for treatment, he elected to proceed with the above surgical procedure(s). We have discussed the potential benefits and risks of the procedure, side effects of the proposed treatment, the likelihood of the patient achieving the goals of the procedure, and any potential problems that might occur during the procedure or recuperation. Informed consent has been obtained.  Description of procedure:  The patient was taken to the operating room and IV sedation was obtained.  The patient was placed in the dorsal lithotomy position, prepped and draped in the usual sterile fashion.  Therapeutic  antibiotics had been administered in the ED.  A preoperative time-out was performed.   A 21 French cystoscope sheath with obturator was lubricated and passed per urethra.  The bladder was drained and panendoscopy was performed with findings as described above.  Attention was directed to the left UO and a 0.38 Sensor guidewire was advanced up the ureter into the renal pelvis under fluoroscopic guidance.  A 5 French open-ended ureteral catheter was then advanced over the wire into the renal pelvis under fluoroscopic guidance.  The guidewire was removed and 10 cc of murky, blood-tinged urine was aspirated and sent for culture.  Retrograde pyelogram was then performed to the ureteral catheter with findings as described above.  The Sensor wire was replaced and the ureteral catheter was removed.  A 6 French/24 cm Contour ureteral stent was then advanced over the guidewire without difficulty.  There was a good curl noted in the renal pelvis under fluoroscopy and in the bladder under direct vision.  The bladder was then emptied and a 16 French Foley catheter was placed to obtain maximal drainage.  The balloon was inflated with 10 cc of sterile water. The patient appeared to tolerate the procedure well and without complications.  She was then transported to the PACU in stable condition.  Plan: IV antibiotic therapy will be continued until urine cultures return.  She will be scheduled for definitive stone treatment in the near future.

## 2020-07-15 NOTE — ED Triage Notes (Signed)
Patient to ER for c/o AMS and kidney stone. Patient was sent by Dr. Delana Meyer office for possible septic stone. Patient wears O2 intermittently at home, but has had to increase O2 use to constant use (normally wears 2L, on 2L now). Patient lethargic in triage.

## 2020-07-15 NOTE — Consult Note (Signed)
Urology Consult Note  Refer to Dr. Delana Meyer office note from this morning.  She has an obstructing left proximal ureteral calculus with concern for sepsis.  She is scheduled for urgent placement of a left ureteral stent.  Exam: She is tachypneic and with chills.  Vital signs stable.  Impression:   59 y.o. female with an obstructing left proximal ureteral calculus and sepsis.  Plan:   Urgent cystoscopy with placement left ureteral stent.  The procedure was discussed in detail including potential risks of bleeding, worsening sepsis as well as anesthetic risks.  We discussed gust the low likelihood of an impacted stone and inability to place a ureteral stent which would then require placement of a percutaneous nephrostomy tube by interventional radiology.  All questions were answered and she desires to proceed.

## 2020-07-15 NOTE — H&P (View-Only) (Signed)
Urology Consult Note  Refer to Dr. Brandon's office note from this morning.  She has an obstructing left proximal ureteral calculus with concern for sepsis.  She is scheduled for urgent placement of a left ureteral stent.  Exam: She is tachypneic and with chills.  Vital signs stable.  Impression:   59 y.o. female with an obstructing left proximal ureteral calculus and sepsis.  Plan:   Urgent cystoscopy with placement left ureteral stent.  The procedure was discussed in detail including potential risks of bleeding, worsening sepsis as well as anesthetic risks.  We discussed gust the low likelihood of an impacted stone and inability to place a ureteral stent which would then require placement of a percutaneous nephrostomy tube by interventional radiology.  All questions were answered and she desires to proceed. 

## 2020-07-15 NOTE — Transfer of Care (Signed)
Immediate Anesthesia Transfer of Care Note  Patient: Anne Rush  Procedure(s) Performed: CYSTOSCOPY WITH RETROGRADE PYELOGRAM/URETERAL STENT PLACEMENT (Left Ureter)  Patient Location: PACU  Anesthesia Type:MAC, awake   Level of Consciousness: awake, drowsy and patient cooperative  Airway & Oxygen Therapy: Patient Spontanous Breathing and Patient connected to face mask oxygen  Post-op Assessment: Report given to RN and Post -op Vital signs reviewed and stable  Post vital signs: Reviewed and stable, pt febrile potentially septic   Last Vitals:  Vitals Value Taken Time  BP    Temp    Pulse 117 07/15/20 1640  Resp 32 07/15/20 1640  SpO2 99 % 07/15/20 1640  Vitals shown include unvalidated device data.  Last Pain:  Vitals:   07/15/20 1509  TempSrc: Tympanic  PainSc:          Complications: No complications documented.

## 2020-07-15 NOTE — ED Provider Notes (Signed)
[facility] [place] Emergency Department Provider Note ____________________________________________   First MD Initiated Contact with Patient 07/15/20 1015     (approximate)  I have reviewed the triage vital signs and the nursing notes.   HISTORY  Chief Complaint Nephrolithiasis and Altered Mental Status    HPI Anne Rush is a 59 y.o. female with PMH as noted below and status post recent diagnosis of a kidney stone who presents with worsening generalized weakness over the last few days, gradual onset, and associated with dizziness and lightheadedness.  The patient states that she feels somewhat confused and like she cannot concentrate.  She has also had increased shortness of breath.  She has a history of interstitial lung disease and normally just has to use oxygen occasionally, most recently few months ago; she has had to use it continuously over the last day.  She states that she took Percocet once for pain, but did not like how it made her feel and was concerned it could be contributing to her feeling lightheaded and confused, so she has not taken it again.  Past Medical History:  Diagnosis Date  . Anxiety   . Family history of ovarian cancer   . Hyperlipidemia   . Hypertension   . Interstitial lung disease (HCC)   . Pollen allergies   . Squamous cell carcinoma 2014   skin cancer  . Subclinical hyperthyroidism 06/17/2014    Patient Active Problem List   Diagnosis Date Noted  . Kidney stone on left side 07/15/2020  . Acute pyelonephritis 07/15/2020  . GERD (gastroesophageal reflux disease) 07/15/2020  . Anxiety 07/15/2020  . Chronic systolic CHF (congestive heart failure) (HCC) 07/15/2020  . Acute metabolic encephalopathy 07/15/2020  . Sepsis (HCC) 07/15/2020  . Hyponatremia 07/15/2020  . Hypokalemia 07/15/2020  . AKI (acute kidney injury) (HCC) 07/15/2020  . Thrombocytopenia (HCC) 07/15/2020  . ILD (interstitial lung disease) (HCC) 04/18/2020  .  Acute respiratory failure with hypoxia (HCC) 04/02/2020  . Acute exacerbation of idiopathic pulmonary fibrosis (HCC) 04/02/2020  . Diastolic dysfunction 04/02/2020  . Pacemaker 10/01/2018  . Vitamin D deficiency 01/11/2018  . Allergic angioedema 03/17/2016  . Complete heart block (HCC) 02/26/2016  . Obesity (BMI 30-39.9) 04/28/2014  . Postinflammatory pulmonary fibrosis (HCC) 08/27/2010  . Former tobacco use 08/28/2008  . Hyperlipidemia LDL goal <100 06/21/2007  . GAD (generalized anxiety disorder) 06/05/2007  . Essential hypertension 06/05/2007  . ALLERGIC RHINITIS 06/05/2007    Past Surgical History:  Procedure Laterality Date  . COSMETIC SURGERY    . EP IMPLANTABLE DEVICE N/A 02/26/2016   Procedure: Pacemaker Implant;  Surgeon: Will Jorja Loa, MD;  Location: [facility] INVASIVE CV LAB;  Service: Cardiovascular;  Laterality: N/A;  . TUBAL LIGATION      Prior to Admission medications   Medication Sig Start Date End Date Taking? Authorizing Provider  Ascorbic Acid (VITAMIN C) 1000 MG tablet Take 1,000 mg by mouth daily.   Yes [provider]  carvedilol (COREG) 25 MG tablet TAKE ONE TABLET BY MOUTH TWICE A DAY WITH MEALS Patient taking differently: Take 25 mg by mouth 2 (two) times daily with a meal.  02/29/20  Yes Camnitz, Will Daphine Deutscher, MD  cholecalciferol (VITAMIN D3) 25 MCG (1000 UT) tablet Take 1,000 Units by mouth daily.    Yes [provider]  famotidine (PEPCID) 20 MG tablet Take 1 tablet (20 mg total) by mouth daily. 04/06/20  Yes Lynn Ito, MD  hydrochlorothiazide (HYDRODIURIL) 12.5 MG tablet Take 1 tablet (12.5  mg total) by mouth daily. 04/05/20  Yes Lynn Ito, MD  MAGNESIUM PO Take 1 tablet by mouth daily.   Yes [provider]  predniSONE (DELTASONE) 5 MG tablet Take 5mg  per day through mid-nov 2021 -> then 5mg  Monday, Wed, Friday -> till further notice 07/02/20  Yes Kalman Shan, MD  promethazine (PHENERGAN) 25 MG tablet Take 1 tablet (25 mg  total) by mouth every 6 (six) hours as needed for nausea or vomiting. 07/13/20  Yes Cuthriell, Delorise Royals, PA-C  spironolactone (ALDACTONE) 25 MG tablet TAKE ONE TABLET BY MOUTH DAILY Patient taking differently: Take 25 mg by mouth daily.  07/02/20  Yes Copland, Karleen Hampshire, MD  tamsulosin (FLOMAX) 0.4 MG CAPS capsule Take 1 capsule (0.4 mg total) by mouth daily. 07/13/20  Yes Cuthriell, Delorise Royals, PA-C  Zinc 30 MG TABS Take 30 mg by mouth daily.   Yes [provider]  benzonatate (TESSALON) 200 MG capsule Take 1 capsule (200 mg total) by mouth 3 (three) times daily as needed for cough. Patient not taking: Reported on 07/15/2020 04/18/20   Coral Ceo, NP  diazepam (VALIUM) 5 MG tablet TAKE ONE TABLET BY MOUTH EVERY 8 HOURS AS NEEDED FOR ANXIETY Patient taking differently: Take 5 mg by mouth every 8 (eight) hours as needed for anxiety.  05/14/20   Copland, Karleen Hampshire, MD  nortriptyline (PAMELOR) 10 MG capsule Take 10 mg by mouth at bedtime.  Patient not taking: Reported on 07/15/2020 04/18/20   [provider]  oxyCODONE-acetaminophen (PERCOCET/ROXICET) 5-325 MG tablet Take 1 tablet by mouth every 6 (six) hours as needed for severe pain. Patient not taking: Reported on 07/15/2020 07/13/20   Cuthriell, Delorise Royals, PA-C    Allergies Codeine, Losartan potassium, Neomycin-polymyxin-dexameth, Oxycodone-acetaminophen, Paroxetine, Sulfonamide derivatives, Lisinopril, and Sulfa antibiotics  Family History  Problem Relation Age of Onset  . Anxiety disorder Mother   . Hypertension Mother   . Ovarian cancer Mother 59  . Cancer Maternal Aunt        breast  . Heart failure Maternal Grandfather     Social History Social History   Tobacco Use  . Smoking status: Former Smoker    Packs/day: 0.50    Years: 30.00    Pack years: 15.00    Quit date: 2012    Years since quitting: 9.8  . Smokeless tobacco: Never Used  Substance Use Topics  . Alcohol use: No  . Drug use: No    Review  of Systems  Constitutional: No fever. Eyes: No redness. ENT: No sore throat. Cardiovascular: Denies chest pain. Respiratory: Positive for shortness of breath. Gastrointestinal: No vomiting or diarrhea.  Genitourinary: Negative for dysuria or hematuria.  Musculoskeletal: Negative for back pain. Skin: Negative for rash. Neurological: Negative for headache.   ____________________________________________   PHYSICAL EXAM:  VITAL SIGNS: ED Triage Vitals [07/15/20 0949]  Enc Vitals Group     BP 98/63     Pulse Rate (!) 102     Resp (!) 22     Temp 99.9 F (37.7 C)     Temp Source Oral     SpO2 96 %     Weight 214 lb 15.2 oz (97.5 kg)     Height 5\' 3"  (1.6 m)     Head Circumference      Peak Flow      Pain Score 8     Pain Loc      Pain Edu?      Excl. in GC?  Constitutional: Alert and oriented.  Somewhat weak appearing but in no acute distress. Eyes: Conjunctivae are normal.  EOMI. Head: Atraumatic. Nose: No congestion/rhinnorhea. Mouth/Throat: Mucous membranes are dry.   Neck: Normal range of motion.  Cardiovascular: Tachycardic, regular rhythm.  Good peripheral circulation. Respiratory: Normal respiratory effort.  No retractions.  Gastrointestinal: Soft with mild left lower quadrant/left flank tenderness.  No distention.  Genitourinary: No CVA tenderness. Musculoskeletal: Extremities warm and well perfused.  Neurologic:  Normal speech and language. No gross focal neurologic deficits are appreciated.  Skin:  Skin is warm and dry. No rash noted. Psychiatric: Mood and affect are normal. Speech and behavior are normal.  ____________________________________________   LABS (all labs ordered are listed, but only abnormal results are displayed)  Labs Reviewed  LACTIC ACID, PLASMA - Abnormal; Notable for the following components:      Result Value   Lactic Acid, Venous 2.4 (*)    All other components within normal limits  COMPREHENSIVE METABOLIC PANEL - Abnormal;  Notable for the following components:   Sodium 126 (*)    Potassium 3.4 (*)    Chloride 93 (*)    CO2 21 (*)    Glucose, Bld 163 (*)    BUN 28 (*)    Creatinine, Ser 1.73 (*)    Calcium 8.6 (*)    Albumin 3.4 (*)    Total Bilirubin 1.7 (*)    GFR, Estimated 34 (*)    All other components within normal limits  CBC WITH DIFFERENTIAL/PLATELET - Abnormal; Notable for the following components:   WBC 16.0 (*)    HCT 35.5 (*)    Platelets 92 (*)    Neutro Abs 14.2 (*)    Lymphs Abs 0.6 (*)    Abs Immature Granulocytes 0.26 (*)    All other components within normal limits  URINALYSIS, COMPLETE (UACMP) WITH MICROSCOPIC - Abnormal; Notable for the following components:   Color, Urine AMBER (*)    APPearance CLOUDY (*)    Hgb urine dipstick SMALL (*)    Protein, ur 100 (*)    Leukocytes,Ua SMALL (*)    Bacteria, UA MANY (*)    All other components within normal limits  BRAIN NATRIURETIC PEPTIDE - Abnormal; Notable for the following components:   B Natriuretic Peptide 171.5 (*)    All other components within normal limits  LACTIC ACID, PLASMA - Abnormal; Notable for the following components:   Lactic Acid, Venous 2.5 (*)    All other components within normal limits  APTT - Abnormal; Notable for the following components:   aPTT 40 (*)    All other components within normal limits  TROPONIN I (HIGH SENSITIVITY) - Abnormal; Notable for the following components:   Troponin I (High Sensitivity) 37 (*)    All other components within normal limits  TROPONIN I (HIGH SENSITIVITY) - Abnormal; Notable for the following components:   Troponin I (High Sensitivity) 28 (*)    All other components within normal limits  RESPIRATORY PANEL BY RT PCR (FLU A&B, COVID)  CULTURE, BLOOD (SINGLE)  PROTIME-INR  PROCALCITONIN  LACTIC ACID, PLASMA  LACTATE DEHYDROGENASE  PATHOLOGIST SMEAR REVIEW  SAVE SMEAR (SSMR)  OSMOLALITY  OSMOLALITY, URINE  SODIUM, URINE, RANDOM  BASIC METABOLIC PANEL  BASIC  METABOLIC PANEL  MAGNESIUM  PHOSPHORUS  TROPONIN I (HIGH SENSITIVITY)   ____________________________________________  EKG  ED ECG REPORT I, Dionne Bucy, the attending physician, personally viewed and interpreted this ECG.  Date: 07/15/2020 EKG Time: 0944 Rate: 102 Rhythm:  Paced rhythm ST/T Wave abnormalities: normal Narrative Interpretation: no evidence of acute ischemia; no significant change when compared to EKG of 04/01/2020  ____________________________________________  RADIOLOGY  CXR interpreted by me shows no focal infiltrate or edema   ____________________________________________   PROCEDURES  Procedure(s) performed: No  Procedures  Critical Care performed: No ____________________________________________   INITIAL IMPRESSION / ASSESSMENT AND PLAN / ED COURSE  Pertinent labs & imaging results that were available during my care of the patient were reviewed by me and considered in my medical decision making (see chart for details).  58 year old female with PMH as noted above including interstitial lung disease and a recent diagnosis of a ureteral stone presents with worsening generalized weakness and confusion over the last few days.  She was seen at the urology clinic and sent into the ED with concern for possible septic stone.  I reviewed the past medical records in Epic.  The patient was seen in the ED on 10/24 and CT showed a left UPJ stone measuring 6 mm with high-grade obstruction of the left kidney.  Pain was well controlled in the ED.  On exam currently, the patient is alert and oriented.  Her temperature is borderline elevated, blood pressure is borderline low and she is slightly tachycardic.  O2 saturation is in the high 90s on 2 L by nasal cannula.  The abdomen is soft with mild left lower quadrant/left flank tenderness.  Differential includes pyelonephritis/septic ureteral stone, COVID-19, pneumonia, or other infection, acute renal insufficiency,  other metabolic disturbance.  I have a low suspicion for cardiac cause.  We will obtain a lab work-up, urinalysis, chest x-ray, give fluids, and reassess.  ----------------------------------------- 1:10 PM on 07/15/2020 -----------------------------------------  Workup is consistent with UTI and possible sepsis.  Blood pressure improved with fluids.  Antibiotics have been given.  I signed the patient out to the hospitalist Dr. Clyde Lundborg.    ____________________________________________   FINAL CLINICAL IMPRESSION(S) / ED DIAGNOSES  Final diagnoses:  Acute pyelonephritis      NEW MEDICATIONS STARTED DURING THIS VISIT:  Current Discharge Medication List       Note:  This document was prepared using Dragon voice recognition software and may include unintentional dictation errors.    Dionne Bucy, MD 07/15/20 [id number]

## 2020-07-15 NOTE — Progress Notes (Signed)
CODE SEPSIS - PHARMACY COMMUNICATION  **Broad Spectrum Antibiotics should be administered within 1 hour of Sepsis diagnosis**  Time Code Sepsis Called/Page Received: 13:17  Antibiotics Ordered: Ceftriaxone  Time of 1st antibiotic administration: 13:01  Additional action taken by pharmacy: n/a  If necessary, Name of Provider/Nurse Contacted: n/a    Foye Deer ,PharmD Clinical Pharmacist  07/15/2020  1:22 PM

## 2020-07-15 NOTE — H&P (Addendum)
History and Physical    Anne Rush ZOX:[id number] DOB: [birth year] DOA: 07/15/2020  Referring MD/NP/PA:   PCP: [name], MD   Patient coming from:  The patient is coming from home.  At baseline, pt is independent for most of ADL.        Chief Complaint: left flank pain and confusion  HPI: Anne Rush is a 59 y.o. female with medical history significant of hypertension, hyperlipidemia, pulmonary fibrosis on 2 L oxygen, pacemaker placement, sCHF with EF of 45-50%, GERD, hypothyroidism, anxiety, recently diagnosed kidney stone, who presents with left flank pain and confusion.  Per her husband at bedside, pt has been having right flank pain recently. She was seen in ED and had CT scan on 10/24 which showed high-grade obstruction of the LEFT kidney with obstructing calculus at the LEFT ureteral pelvic junction, several additional calculi within lower pole of the LEFT kidney and single RIGHT renal calculus. She continues to have left flank pain.  Patient was seen by urologist in the office today and was sent to ED for possible stent placement.  Patient has nausea and vomited few times.  No abdominal pain.  Her left flank pain is constant, sharp, radiating to the left groin area, moderate.  Patient denies symptoms of UTI or hematuria.  Patient is intermittently confused per her husband.  Currently patient is orientated to place and person, but intermittently confused about time.  Denies chest pain.  Patient has chronic shortness of breath and cough due to pulmonary fibrosis.  She is using 2 L oxygen at home, currently oxygen saturation 95% on 2 L oxygen.  ED Course: pt was found to have positive urinalysis (cloudy appearance, small amount of leukocyte, many bacteria, WBC 21-50), WBC 16.0, lactic acid 2.4, troponin level 37, sodium 126, potassium 3.4, worsening renal function, temperature 99.7, soft blood pressure, tachycardia with heart rate of 102, tachypnea with RR 25, chest x-ray showed  mild left basilar atelectasis. Pt is admitted to progressive bed as inpatient. Dr. Lonna Cobb of urology is consulted.  Review of Systems:   General: no subjective fevers, chills, no body weight gain, has fatigue HEENT: no blurry vision, hearing changes or sore throat Respiratory: has dyspnea, coughing, no wheezing CV: no chest pain, no palpitations GI: has nausea, vomiting, no abdominal pain, diarrhea, constipation GU: no dysuria, burning on urination, increased urinary frequency, hematuria. Has left flank pain Ext: Has trace leg edema Neuro: no unilateral weakness, numbness, or tingling, no vision change or hearing loss. Has AMS Skin: no rash, no skin tear. MSK: No muscle spasm, no deformity, no limitation of range of movement in spin Heme: No easy bruising.  Travel history: No recent long distant travel.  Allergy:  Allergies  Allergen Reactions  . Codeine Hives and Itching  . Losartan Potassium Other (See Comments)  . Neomycin-Polymyxin-Dexameth Itching, Swelling and Other (See Comments)    Swelling and itching of eye   . Oxycodone-Acetaminophen Other (See Comments)    REACTION: unspecified  . Paroxetine Other (See Comments)    unknown  . Sulfonamide Derivatives Other (See Comments)    REACTION: unspecified  . Lisinopril Rash  . Sulfa Antibiotics Hives and Rash         Past Medical History:  Diagnosis Date  . Anxiety   . Family history of ovarian cancer   . Hyperlipidemia   . Hypertension   . Interstitial lung disease (HCC)   . Pollen allergies   . Squamous cell carcinoma 2014   skin  cancer  . Subclinical hyperthyroidism 06/17/2014    Past Surgical History:  Procedure Laterality Date  . COSMETIC SURGERY    . EP IMPLANTABLE DEVICE N/A 02/26/2016   Procedure: Pacemaker Implant;  Surgeon: Will Jorja Loa, MD;  Location: [facility];  Service: Cardiovascular;  Laterality: N/A;  . TUBAL LIGATION      Social History:  reports that she quit smoking about 9  years ago. She has a 15.00 pack-year smoking history. She has never used smokeless tobacco. She reports that she does not drink alcohol and does not use drugs.  Family History:  Family History  Problem Relation Age of Onset  . Anxiety disorder Mother   . Hypertension Mother   . Ovarian cancer Mother 81  . Cancer Maternal Aunt        breast  . Heart failure Maternal Grandfather      Prior to Admission medications   Medication Sig Start Date End Date Taking? Authorizing Provider  Ascorbic Acid (VITAMIN C) 1000 MG tablet Take 1,000 mg by mouth daily.   Yes [provider]  carvedilol (COREG) 25 MG tablet TAKE ONE TABLET BY MOUTH TWICE A DAY WITH MEALS Patient taking differently: Take 25 mg by mouth 2 (two) times daily with a meal.  02/29/20  Yes Camnitz, Will Daphine Deutscher, MD  cholecalciferol (VITAMIN D3) 25 MCG (1000 UT) tablet Take 1,000 Units by mouth daily.    Yes [provider]  famotidine (PEPCID) 20 MG tablet Take 1 tablet (20 mg total) by mouth daily. 04/06/20  Yes Lynn Ito, MD  hydrochlorothiazide (HYDRODIURIL) 12.5 MG tablet Take 1 tablet (12.5 mg total) by mouth daily. 04/05/20  Yes Lynn Ito, MD  MAGNESIUM PO Take 1 tablet by mouth daily.   Yes [provider]  predniSONE (DELTASONE) 5 MG tablet Take 5mg  per day through mid-nov 2021 -> then 5mg  Monday, Wed, Friday -> till further notice 07/02/20  Yes Sunday, MD  promethazine (PHENERGAN) 25 MG tablet Take 1 tablet (25 mg total) by mouth every 6 (six) hours as needed for nausea or vomiting. 07/13/20  Yes Cuthriell, Kalman Shan, PA-C  spironolactone (ALDACTONE) 25 MG tablet TAKE ONE TABLET BY MOUTH DAILY Patient taking differently: Take 25 mg by mouth daily.  07/02/20  Yes Copland, Delorise Royals, MD  tamsulosin (FLOMAX) 0.4 MG CAPS capsule Take 1 capsule (0.4 mg total) by mouth daily. 07/13/20  Yes Cuthriell, Karleen Hampshire, PA-C  Zinc 30 MG TABS Take 30 mg by mouth daily.   Yes [provider]   benzonatate (TESSALON) 200 MG capsule Take 1 capsule (200 mg total) by mouth 3 (three) times daily as needed for cough. Patient not taking: Reported on 07/15/2020 04/18/20   07/17/2020, NP  diazepam (VALIUM) 5 MG tablet TAKE ONE TABLET BY MOUTH EVERY 8 HOURS AS NEEDED FOR ANXIETY Patient taking differently: Take 5 mg by mouth every 8 (eight) hours as needed for anxiety.  05/14/20   Copland, Coral Ceo, MD  nortriptyline (PAMELOR) 10 MG capsule Take 10 mg by mouth at bedtime.  Patient not taking: Reported on 07/15/2020 04/18/20   [provider]  oxyCODONE-acetaminophen (PERCOCET/ROXICET) 5-325 MG tablet Take 1 tablet by mouth every 6 (six) hours as needed for severe pain. Patient not taking: Reported on 07/15/2020 07/13/20   07/17/2020, PA-C    Physical Exam: Vitals:   07/15/20 1723 07/15/20 1726 07/15/20 1727 07/15/20 1801  BP:  129/62 129/62 105/61  Pulse: (!) 107 (!) 107 (!) 110 07/17/20)  105  Resp: (!) 21 19 (!) 21 18  Temp:  (!) 102.2 F (39 C) (!) 102.5 F (39.2 C) 100.1 F (37.8 C)  TempSrc:      SpO2: 94% 94% 94% 96%  Weight:    99.8 kg  Height:    5\' 1"  (1.549 m)   General: Not in acute distress HEENT:       Eyes: PERRL, EOMI, no scleral icterus.       ENT: No discharge from the ears and nose, no pharynx injection, no tonsillar enlargement.        Neck: No JVD, no bruit, no mass felt. Heme: No neck lymph node enlargement. Cardiac: S1/S2, RRR, No murmurs, No gallops or rubs. Respiratory: No rales, wheezing, rhonchi or rubs.  Decreased air movement bilaterally GI: Soft, nondistended, nontender, no organomegaly, BS present. GU: positive left CVA tenderness Ext: Has trace leg edema bilaterally. 1+DP/PT pulse bilaterally. Musculoskeletal: No joint deformities, No joint redness or warmth, no limitation of ROM in spin. Skin: No rashes.  Neuro: Lethargic, mildly confused, oriented to place and person, but confused about time, cranial nerves II-XII grossly intact,  moves all extremities normally.  Psych: Patient is not psychotic, no suicidal or hemocidal ideation.  Labs on Admission: I have personally reviewed following labs and imaging studies  CBC: Recent Labs  Lab 07/13/20 1211 07/15/20 1002  WBC 14.7* 16.0*  NEUTROABS  --  14.2*  HGB 14.2 12.7  HCT 39.7 35.5*  MCV 90.6 89.2  PLT 209 92*   Basic Metabolic Panel: Recent Labs  Lab 07/13/20 1214 07/15/20 1002 07/15/20 1718  NA 141 126* 130*  K 3.7 3.4* 3.4*  CL 102 93* 98  CO2 27 21* 22  GLUCOSE 138* 163* 133*  BUN 13 28* 28*  CREATININE 0.84 1.73* 1.20*  CALCIUM 9.2 8.6* 8.2*  MG  --   --  1.5*  PHOS  --   --  2.0*   GFR: Estimated Creatinine Clearance: 56 mL/min (A) (by C-G formula based on SCr of 1.2 mg/dL (H)). Liver Function Tests: Recent Labs  Lab 07/13/20 1214 07/15/20 1002  AST 22 26  ALT 20 15  ALKPHOS 37* 51  BILITOT 1.5* 1.7*  PROT 7.3 6.8  ALBUMIN 4.3 3.4*   Recent Labs  Lab 07/13/20 1214  LIPASE 25   No results for input(s): AMMONIA in the last 168 hours. Coagulation Profile: Recent Labs  Lab 07/15/20 1002  INR 1.2   Cardiac Enzymes: No results for input(s): CKTOTAL, CKMB, CKMBINDEX, TROPONINI in the last 168 hours. BNP (last 3 results) No results for input(s): PROBNP in the last 8760 hours. HbA1C: No results for input(s): HGBA1C in the last 72 hours. CBG: No results for input(s): GLUCAP in the last 168 hours. Lipid Profile: No results for input(s): CHOL, HDL, LDLCALC, TRIG, CHOLHDL, LDLDIRECT in the last 72 hours. Thyroid Function Tests: No results for input(s): TSH, T4TOTAL, FREET4, T3FREE, THYROIDAB in the last 72 hours. Anemia Panel: No results for input(s): VITAMINB12, FOLATE, FERRITIN, TIBC, IRON, RETICCTPCT in the last 72 hours. Urine analysis:    Component Value Date/Time   COLORURINE AMBER (A) 07/15/2020 1002   APPEARANCEUR CLOUDY (A) 07/15/2020 1002   APPEARANCEUR Clear 04/12/2018 1050   LABSPEC 1.024 07/15/2020 1002    PHURINE 5.0 07/15/2020 1002   GLUCOSEU NEGATIVE 07/15/2020 1002   HGBUR SMALL (A) 07/15/2020 1002   HGBUR trace-intact 08/12/2009 1128   BILIRUBINUR NEGATIVE 07/15/2020 1002   BILIRUBINUR Negative 04/12/2018 1050   KETONESUR  NEGATIVE 07/15/2020 1002   PROTEINUR 100 (A) 07/15/2020 1002   UROBILINOGEN 0.2 08/12/2009 1128   NITRITE NEGATIVE 07/15/2020 1002   LEUKOCYTESUR SMALL (A) 07/15/2020 1002   Sepsis Labs: (procalcitonin:4,lacticidven:4) ) Recent Results (from the past 240 hour(s))  Respiratory Panel by RT PCR (Flu A&B, Covid) - Nasopharyngeal Swab     Status: None   Collection Time: 07/15/20 10:02 AM   Specimen: Nasopharyngeal Swab  Result Value Ref Range Status   SARS Coronavirus 2 by RT PCR NEGATIVE NEGATIVE Final    Comment: (NOTE) SARS-CoV-2 target nucleic acids are NOT DETECTED.  The SARS-CoV-2 RNA is generally detectable in upper respiratoy specimens during the acute phase of infection. The lowest concentration of SARS-CoV-2 viral copies this assay can detect is 131 copies/mL. A negative result does not preclude SARS-Cov-2 infection and should not be used as the sole basis for treatment or other patient management decisions. A negative result may occur with  improper specimen collection/handling, submission of specimen other than nasopharyngeal swab, presence of viral mutation(s) within the areas targeted by this assay, and inadequate number of viral copies (<131 copies/mL). A negative result must be combined with clinical observations, patient history, and epidemiological information. The expected result is Negative.  Fact Sheet for Patients:  https://www.moore.com/  Fact Sheet for Healthcare Providers:  https://www.young.biz/  This test is no t yet approved or cleared by the [place] FDA and  has been authorized for detection and/or diagnosis of SARS-CoV-2 by FDA under an Emergency Use Authorization (EUA).  This EUA will remain  in effect (meaning this test can be used) for the duration of the COVID-19 declaration under Section 564(b)(1) of the Act, 21 U.S.C. section 360bbb-3(b)(1), unless the authorization is terminated or revoked sooner.     Influenza A by PCR NEGATIVE NEGATIVE Final   Influenza B by PCR NEGATIVE NEGATIVE Final    Comment: (NOTE) The Xpert Xpress SARS-CoV-2/FLU/RSV assay is intended as an aid in  the diagnosis of influenza from Nasopharyngeal swab specimens and  should not be used as a sole basis for treatment. Nasal washings and  aspirates are unacceptable for Xpert Xpress SARS-CoV-2/FLU/RSV  testing.  Fact Sheet for Patients: https://www.moore.com/  Fact Sheet for Healthcare Providers: https://www.young.biz/  This test is not yet approved or cleared by the [place] FDA and  has been authorized for detection and/or diagnosis of SARS-CoV-2 by  FDA under an Emergency Use Authorization (EUA). This EUA will remain  in effect (meaning this test can be used) for the duration of the  Covid-19 declaration under Section 564(b)(1) of the Act, 21  U.S.C. section 360bbb-3(b)(1), unless the authorization is  terminated or revoked. Performed at [facility], [address] [place], [place] [address]      Radiological Exams on Admission: DG Chest 2 View  Result Date: 07/15/2020 CLINICAL DATA:  Altered mental status with cardiac arrhythmia EXAM: CHEST - 2 VIEW COMPARISON:  April 01, 2020 chest radiograph; chest CT April 03, 2019 1 FINDINGS: There is mild left base atelectasis. No edema or airspace opacity. Heart is upper normal in size with pulmonary vascularity normal. Pacemaker leads are attached to the right atrium and right ventricle. No adenopathy. There is mild degenerative change in the thoracic spine. No pneumothorax. IMPRESSION: Mild left base atelectasis. No edema or airspace opacity. Heart upper normal in size.  Pacemaker leads attached to right atrium and right ventricle. No evident adenopathy. Electronically Signed   By: Bretta Bang III M.D.   On: 07/15/2020  10:33   DG OR UROLOGY CYSTO IMAGE ([facility] ONLY)  Result Date: 07/15/2020 There is no interpretation for this exam.  This order is for images obtained during a surgical procedure.  Please See "Surgeries" Tab for more information regarding the procedure.     EKG: I have personally reviewed.  Not done in ED, will get one.   Assessment/Plan Principal Problem:   Acute pyelonephritis Active Problems:   Essential hypertension   ILD (interstitial lung disease) (HCC)   Kidney stone on left side   GERD (gastroesophageal reflux disease)   Anxiety   Chronic systolic CHF (congestive heart failure) (HCC)   Acute metabolic encephalopathy   Sepsis (HCC)   Hyponatremia   Hypokalemia   AKI (acute kidney injury) (HCC)   Thrombocytopenia (HCC)   Elevated troponin   Sepsis due to acute pyelonephritis in the setting of kidney stone on left side: Patient has infected high-grade obstructive kidney stone.  Patient meets criteria for sepsis with leukocytosis, tachycardia and tachypnea.  Lactic acid is elevated at 2.4.  Blood pressure is soft, currently hemodynamically stable.  Dr. Lonna Cobb of urology is consulted, will take patient to the OR for stent placement.  -will be admitted to progressive bed as inpatient -IV Rocephin -Follow-up of blood culture and urine culture -will get Procalcitonin and trend lactic acid levels per sepsis protocol. -IVF: 1L of NS bolus in ED, followed by 75 cc/h (patient has congestive heart failure, limiting aggressive IV fluids treatment). -Hold Flomax due to soft blood pressure  Essential hypertension  -Hold all blood pressure medications since patient is at high risk of developing hypotension due to sepsis  ILD (interstitial lung disease) (HCC): Stable -Bronchodilators - increase prednisone dose from 5 to 15 mg  daily -Give 50 mg of prednisone today as stress dose  GERD (gastroesophageal reflux disease) -Pepcid  Anxiety -Continue home Valium  Chronic systolic CHF (congestive heart failure) (HCC): 2D echo on 04/02/2020 showed EF of 45-50%.  Patient has trace leg edema, but no worsening shortness of breath.  Chest x-ray did not show pulmonary edema.  CHF seem to be compensated. -Hold spironolactone and HCTZ -Check BNP  Acute metabolic encephalopathy: Most likely due to sepsis and pyelonephritis.  No focal neuro deficit on physical examination.   -Frequent neuro check  Hyponatremia: Most likely due to poor oral intake and dehydration. - Will check urine sodium, urine osmolality, serum osmolality. - check TSH - IVF: 1L NS in ED, will continue with IV normal saline at 75 mL/h - f/u by BMP q8h - avoid over correction too fast due to risk of central pontine myelinolysis  Hypokalemia: Potassium 3.2 -Repleted potassium -Check magnesium level, phosphorus level  AKI (acute kidney injury) (HCC): Likely due to pyelonephritis and kidney stone -Follow-up with BMP  Thrombocytopenia (HCC): likely due to infection.  Platelet 92 -check LDH and peripheral smear  Elevated troponin: No chest pain, likely due to demand ischemia. Troponin 37 -Trend troponin -Check A1c, FLP -will repeat ekg in AM     DVT ppx: SCD Code Status: Full code Family Communication:  Yes, patient's husband at bed side Disposition Plan:  Anticipate discharge back to previous environment Consults called:  Dr. Lonna Cobb of urology Admission status:   progressive unit  as inpt       Status is: Inpatient  Remains inpatient appropriate because:Inpatient level of care appropriate due to severity of illness   Dispo: The patient is from: Home  Anticipated d/c is to: Home              Anticipated d/c date is: 2 days              Patient currently is not medically stable to d/c.          Date of Service  07/15/2020    Lorretta Harp [facility]   If 7PM-7AM, please contact night-coverage [email or web address] 07/15/2020, 7:37 PM

## 2020-07-15 NOTE — ED Notes (Signed)
Pt taken to xray 

## 2020-07-15 NOTE — Sepsis Progress Note (Signed)
Code Sepsis found to have an obstructing left proximal ureteral calculus.  Is scheduled for urgent placement of a left ureteral stent. Has received 1L IV fluids and IV rocephin. Lactic acid 2.4 and 2.5. Blood and urine cultures were sent. Will continue to follow

## 2020-07-15 NOTE — ED Notes (Signed)
EDP at bedside  

## 2020-07-15 NOTE — Progress Notes (Signed)
Patient admitted to unit at this time accompanied with husband and OR nurse. Bedside report received. Patient A/Ox4 upon initial assessment. NAD noted. No pain. ST on tech verified with tele tech. Chronic oxygen at 2L in place. Saturations WNL. VSS. Patient has low grade temp but otherwise stable. Verbalized understanding of remote/bedside accessories. Will continue to monitor patient.

## 2020-07-16 ENCOUNTER — Encounter: Payer: Self-pay | Admitting: Urology

## 2020-07-16 DIAGNOSIS — R748 Abnormal levels of other serum enzymes: Secondary | ICD-10-CM

## 2020-07-16 DIAGNOSIS — N390 Urinary tract infection, site not specified: Secondary | ICD-10-CM

## 2020-07-16 DIAGNOSIS — N10 Acute tubulo-interstitial nephritis: Secondary | ICD-10-CM | POA: Diagnosis not present

## 2020-07-16 DIAGNOSIS — R7881 Bacteremia: Secondary | ICD-10-CM

## 2020-07-16 DIAGNOSIS — N179 Acute kidney failure, unspecified: Secondary | ICD-10-CM

## 2020-07-16 DIAGNOSIS — N138 Other obstructive and reflux uropathy: Secondary | ICD-10-CM

## 2020-07-16 DIAGNOSIS — B964 Proteus (mirabilis) (morganii) as the cause of diseases classified elsewhere: Secondary | ICD-10-CM | POA: Diagnosis not present

## 2020-07-16 DIAGNOSIS — A4159 Other Gram-negative sepsis: Principal | ICD-10-CM

## 2020-07-16 DIAGNOSIS — J988 Other specified respiratory disorders: Secondary | ICD-10-CM

## 2020-07-16 LAB — BLOOD CULTURE ID PANEL (REFLEXED) - BCID2

## 2020-07-16 LAB — LIPID PANEL
Cholesterol: 126 mg/dL (ref 0–200)
HDL: 12 mg/dL — ABNORMAL LOW (ref >40)
LDL Cholesterol: 80 mg/dL (ref 0–99)
Total CHOL/HDL Ratio: 10.5 RATIO
Triglycerides: 168 mg/dL — ABNORMAL HIGH (ref <150)
VLDL: 34 mg/dL (ref 0–40)

## 2020-07-16 LAB — BASIC METABOLIC PANEL
Anion gap: 10 (ref 5–15)
BUN: 27 mg/dL — ABNORMAL HIGH (ref 6–20)
CO2: 23 mmol/L (ref 22–32)
Calcium: 8.7 mg/dL — ABNORMAL LOW (ref 8.9–10.3)
Chloride: 102 mmol/L (ref 98–111)
Creatinine, Ser: 0.79 mg/dL (ref 0.44–1.00)
GFR, Estimated: >60 mL/min (ref >60)
Glucose, Bld: 165 mg/dL — ABNORMAL HIGH (ref 70–99)
Potassium: 3.7 mmol/L (ref 3.5–5.1)
Sodium: 135 mmol/L (ref 135–145)

## 2020-07-16 LAB — CBC
HCT: 33.3 % — ABNORMAL LOW (ref 36.0–46.0)
Hemoglobin: 11.9 g/dL — ABNORMAL LOW (ref 12.0–15.0)
MCH: 32.4 pg (ref 26.0–34.0)
MCHC: 35.7 g/dL (ref 30.0–36.0)
MCV: 90.7 fL (ref 80.0–100.0)
Platelets: 66 10*3/uL — ABNORMAL LOW (ref 150–400)
RBC: 3.67 MIL/uL — ABNORMAL LOW (ref 3.87–5.11)
RDW: 13 % (ref 11.5–15.5)
WBC: 14.3 10*3/uL — ABNORMAL HIGH (ref 4.0–10.5)
nRBC: 0 % (ref 0.0–0.2)

## 2020-07-16 LAB — GLUCOSE, CAPILLARY
Glucose-Capillary: 139 mg/dL — ABNORMAL HIGH (ref 70–99)
Glucose-Capillary: 151 mg/dL — ABNORMAL HIGH (ref 70–99)
Glucose-Capillary: 149 mg/dL — ABNORMAL HIGH (ref 70–99)

## 2020-07-16 MED ORDER — SODIUM CHLORIDE 0.9 % IV SOLN
2.0000 g | INTRAVENOUS | Status: DC
  Administered 2020-07-16 – 2020-07-20 (×5): 2 g via INTRAVENOUS
  Filled 2020-07-16: qty 2
  Filled 2020-07-16: qty 20
  Filled 2020-07-16: qty 2
  Filled 2020-07-16 (×2): qty 20

## 2020-07-16 MED ORDER — HYDROCODONE-ACETAMINOPHEN 5-325 MG PO TABS
1.0000 | ORAL_TABLET | Freq: Four times a day (QID) | ORAL | Status: DC | PRN
  Administered 2020-07-16 – 2020-07-20 (×12): 1 via ORAL
  Filled 2020-07-16 (×12): qty 1

## 2020-07-16 MED ORDER — ACETAMINOPHEN 325 MG PO TABS: 650.0000 mg | ORAL_TABLET | Freq: Four times a day (QID) | ORAL | Status: DC | PRN

## 2020-07-16 MED ORDER — CHLORHEXIDINE GLUCONATE CLOTH 2 % EX PADS
6.0000 | MEDICATED_PAD | Freq: Every day | CUTANEOUS | Status: DC
  Administered 2020-07-16 – 2020-07-20 (×4): 6 via TOPICAL

## 2020-07-16 NOTE — Progress Notes (Signed)
Urology Consult Follow Up  Subjective: Patient resting comfortably with husband at bedside.  No acute events overnight.  Vital signs stable with fever reducing through the night.  She is afebrile this morning.  Serum creatinine 0.79 down from 1.21 yesterday.  White blood cell count 14.3 down from 16.0 yesterday.  Good urine output.  Foley in place with pink-tinged urine.  Preliminary blood cultures positive for Proteus.  Urine culture is still pending.  Anti-infectives: Anti-infectives (From admission, onward)   Start     Dose/Rate Route Frequency Ordered Stop   07/16/20 1000  cefTRIAXone (ROCEPHIN) 2 g in sodium chloride 0.9 % 100 mL IVPB        2 g 200 mL/hr over 30 Minutes Intravenous Every 24 hours 07/16/20 0740     07/15/20 1245  cefTRIAXone (ROCEPHIN) 1 g in sodium chloride 0.9 % 100 mL IVPB  Status:  Discontinued        1 g 200 mL/hr over 30 Minutes Intravenous Every 24 hours 07/15/20 1244 07/16/20 0740      Current Facility-Administered Medications  Medication Dose Route Frequency Provider Last Rate Last Admin  . 0.9 %  sodium chloride infusion   Intravenous Continuous Lorretta Harp, MD 75 mL/hr at 07/15/20 2310 New Bag at 07/15/20 2310  . acetaminophen (TYLENOL) tablet 650 mg  650 mg Oral Q6H PRN Lorretta Harp, MD   650 mg at 07/16/20 0530  . albuterol (VENTOLIN HFA) 108 (90 Base) MCG/ACT inhaler 2 puff  2 puff Inhalation Q4H PRN Lorretta Harp, MD      . ascorbic acid (VITAMIN C) tablet 1,000 mg  1,000 mg Oral Daily Lorretta Harp, MD      . cefTRIAXone (ROCEPHIN) 2 g in sodium chloride 0.9 % 100 mL IVPB  2 g Intravenous Q24H Ronnald Ramp, RPH      . cholecalciferol (VITAMIN D3) tablet 1,000 Units  1,000 Units Oral Daily Lorretta Harp, MD      . dextromethorphan-guaiFENesin [facility] DM) 30-600 MG per 12 hr tablet 1 tablet  1 tablet Oral BID PRN Lorretta Harp, MD      . diazepam (VALIUM) tablet 5 mg  5 mg Oral Q8H PRN Lorretta Harp, MD      . famotidine (PEPCID) tablet 20 mg  20 mg Oral Daily  Lorretta Harp, MD      . magnesium oxide (MAG-OX) tablet 400 mg  400 mg Oral Daily Lorretta Harp, MD      . morphine 2 MG/ML injection 1 mg  1 mg Intravenous Q4H PRN Lorretta Harp, MD      . nortriptyline (PAMELOR) capsule 10 mg  10 mg Oral QHS Lorretta Harp, MD   10 mg at 07/15/20 2310  . ondansetron (ZOFRAN) injection 4 mg  4 mg Intravenous Q8H PRN Lorretta Harp, MD      . potassium chloride SA (KLOR-CON) CR tablet 40 mEq  40 mEq Oral Once Lorretta Harp, MD      . predniSONE (DELTASONE) tablet 15 mg  15 mg Oral Q breakfast Lorretta Harp, MD      . predniSONE (DELTASONE) tablet 50 mg  50 mg Oral Once Lorretta Harp, MD      . zinc sulfate capsule 220 mg  220 mg Oral Daily Dorothea Ogle B, RPH         Objective: Vital signs in last 24 hours: Temp:  [98 F (36.7 C)-104 F (40 C)] 98 F (36.7 C) (10/27 0515) Pulse Rate:  [70-117] 94 (10/27 0515) Resp:  [  17-32] 17 (10/27 0515) BP: (98-139)/(61-94) 105/69 (10/27 0515) SpO2:  [93 %-100 %] 97 % (10/27 0515) Weight:  [97.5 kg-99.8 kg] 97.5 kg (10/27 0515)  Intake/Output from previous day: 10/26 0701 - 10/27 0700 In: 633.1 [I.V.:533.1; IV Piggyback:100] Out: 1600 [Urine:1600] Intake/Output this shift: No intake/output data recorded.   Physical Exam Constitutional:  Well nourished. Alert and oriented, No acute distress. HEENT:  AT, moist mucus membranes.  Trachea midline, no masses. Cardiovascular: No clubbing, cyanosis, or edema. Respiratory: Normal respiratory effort, no increased work of breathing. GI: Abdomen is soft, non tender, non distended, no abdominal masses.  Slightly tender in the left lower quadrant GU: No CVA tenderness.  No bladder fullness or masses.  Foley in place. Neurologic: Grossly intact, no focal deficits, moving all 4 extremities. Psychiatric: Normal mood and affect.   Lab Results:  Recent Labs    07/15/20 1002 07/16/20 0521  WBC 16.0* 14.3*  HGB 12.7 11.9*  HCT 35.5* 33.3*  PLT 92* 66*   BMET Recent Labs     07/15/20 2144 07/16/20 0521  NA 132* 135  K 3.4* 3.7  CL 99 102  CO2 22 23  GLUCOSE 162* 165*  BUN 30* 27*  CREATININE 1.21* 0.79  CALCIUM 8.3* 8.7*   PT/INR Recent Labs    07/15/20 1002  LABPROT 14.8  INR 1.2   ABG No results for input(s): PHART, HCO3 in the last 72 hours.  Invalid input(s): PCO2, PO2  Studies/Results: DG Chest 2 View  Result Date: 07/15/2020 CLINICAL DATA:  Altered mental status with cardiac arrhythmia EXAM: CHEST - 2 VIEW COMPARISON:  April 01, 2020 chest radiograph; chest CT April 03, 2019 1 FINDINGS: There is mild left base atelectasis. No edema or airspace opacity. Heart is upper normal in size with pulmonary vascularity normal. Pacemaker leads are attached to the right atrium and right ventricle. No adenopathy. There is mild degenerative change in the thoracic spine. No pneumothorax. IMPRESSION: Mild left base atelectasis. No edema or airspace opacity. Heart upper normal in size. Pacemaker leads attached to right atrium and right ventricle. No evident adenopathy. Electronically Signed   By: Bretta Bang III M.D.   On: 07/15/2020 10:33   DG OR UROLOGY CYSTO IMAGE ([facility] ONLY)  Result Date: 07/15/2020 There is no interpretation for this exam.  This order is for images obtained during a surgical procedure.  Please See "Surgeries" Tab for more information regarding the procedure.     Assessment and Plan: 59 year old female with interstitial lung disease, pacemaker and CHF who presented to the office yesterday for follow-up for a an obstructing left proximal ureteral calculi who was found to be tachypneic and with chills.  She was admitted for sepsis and underwent emergent left ureteral stent placement yesterday with Dr. Irineo Axon.     Recommendations: Continue broad-spectrum antibiotics until blood and urine cultures are available Patient will need 10 days of culture appropriate antibiotic therapy Foley may be discontinued if continued clinical  improvement Outpatient definitive stone treatment has been arranged and is scheduled for 07/28/2020     LOS: 1 day    [facility] Kaytlyn Din 07/16/2020

## 2020-07-16 NOTE — Progress Notes (Signed)
Patient ID: Anne Rush, female   DOB: 1961/09/16, 59 y.o.   MRN: [id number] [facility] PROGRESS NOTE  Persephone Schriever Cofield WUJ:[id number] DOB: Feb 28, 1961 DOA: 07/15/2020 PCP: [name], MD  HPI/Subjective: Patient still having some abdominal pain that started pain.  Patient had a urgent stent placed by urology last night secondary to obstructing stone.  Blood cultures also came back positive today of Proteus.  Objective: Vitals:   07/16/20 0841 07/16/20 1204  BP: 105/65 107/65  Pulse: 94 93  Resp: 18 18  Temp: 97.8 F (36.6 C) 98.3 F (36.8 C)  SpO2: 96% 95%    Intake/Output Summary (Last 24 hours) at 07/16/2020 1531 Last data filed at 07/16/2020 1438 Gross per 24 hour  Intake 633.09 ml  Output 2825 ml  Net -2191.91 ml   Filed Weights   07/15/20 0949 07/15/20 1801 07/16/20 0515  Weight: 97.5 kg 99.8 kg 97.5 kg    ROS: Review of Systems  Respiratory: Negative for cough and shortness of breath.   Cardiovascular: Negative for chest pain.  Gastrointestinal: Positive for abdominal pain. Negative for nausea and vomiting.  Genitourinary: Positive for flank pain.   Exam: Physical Exam HENT:     Head: Normocephalic.     Mouth/Throat:     Pharynx: No oropharyngeal exudate.  Eyes:     General: Lids are normal.     Conjunctiva/sclera: Conjunctivae normal.     Pupils: Pupils are equal, round, and reactive to light.  Cardiovascular:     Rate and Rhythm: Normal rate and regular rhythm.     Heart sounds: Normal heart sounds, S1 normal and S2 normal.  Pulmonary:     Breath sounds: No decreased breath sounds, wheezing, rhonchi or rales.  Abdominal:     Palpations: Abdomen is soft.     Tenderness: There is abdominal tenderness in the left lower quadrant.  Musculoskeletal:     Right ankle: No swelling.     Left ankle: No swelling.  Skin:    General: Skin is warm.     Findings: No rash.  Neurological:     Mental Status: She is alert and oriented to person, place,  and time.       Data Reviewed: Basic Metabolic Panel: Recent Labs  Lab 07/13/20 1214 07/15/20 1002 07/15/20 1718 07/15/20 2144 07/16/20 0521  NA 141 126* 130* 132* 135  K 3.7 3.4* 3.4* 3.4* 3.7  CL 102 93* 98 99 102  CO2 27 21* GLUCOSE 138* 163* 133* 162* 165*  BUN 13 28* 28* 30* 27*  CREATININE 0.84 1.73* 1.20* 1.21* 0.79  CALCIUM 9.2 8.6* 8.2* 8.3* 8.7*  MG  --   --  1.5*  --   --   PHOS  --   --  2.0*  --   --    Liver Function Tests: Recent Labs  Lab 07/13/20 1214 07/15/20 1002  AST 22 26  ALT 20 15  ALKPHOS 37* 51  BILITOT 1.5* 1.7*  PROT 7.3 6.8  ALBUMIN 4.3 3.4*   Recent Labs  Lab 07/13/20 1214  LIPASE 25   CBC: Recent Labs  Lab 07/13/20 1211 07/15/20 1002 07/16/20 0521  WBC 14.7* 16.0* 14.3*  NEUTROABS  --  14.2*  --   HGB 14.2 12.7 11.9*  HCT 39.7 35.5* 33.3*  MCV 90.6 89.2 90.7  PLT 209 92* 66*   BNP (last 3 results) Recent Labs    04/02/20 1857 06/21/20 1330  BNP 21.9 171.5*  CBG: Recent Labs  Lab 07/16/20 0842 07/16/20 1204  GLUCAP 139* 151*    Recent Results (from the past 240 hour(s))  Respiratory Panel by RT PCR (Flu A&B, Covid) - Nasopharyngeal Swab     Status: None   Collection Time: 07/15/20 10:02 AM   Specimen: Nasopharyngeal Swab  Result Value Ref Range Status   SARS Coronavirus 2 by RT PCR NEGATIVE NEGATIVE Final    Comment: (NOTE) SARS-CoV-2 target nucleic acids are NOT DETECTED.  The SARS-CoV-2 RNA is generally detectable in upper respiratoy specimens during the acute phase of infection. The lowest concentration of SARS-CoV-2 viral copies this assay can detect is 131 copies/mL. A negative result does not preclude SARS-Cov-2 infection and should not be used as the sole basis for treatment or other patient management decisions. A negative result may occur with  improper specimen collection/handling, submission of specimen other than nasopharyngeal swab, presence of viral mutation(s) within  the areas targeted by this assay, and inadequate number of viral copies (<131 copies/mL). A negative result must be combined with clinical observations, patient history, and epidemiological information. The expected result is Negative.  Fact Sheet for Patients:  https://www.moore.com/  Fact Sheet for Healthcare Providers:  https://www.young.biz/  This test is no t yet approved or cleared by the [place] FDA and  has been authorized for detection and/or diagnosis of SARS-CoV-2 by FDA under an Emergency Use Authorization (EUA). This EUA will remain  in effect (meaning this test can be used) for the duration of the COVID-19 declaration under Section 564(b)(1) of the Act, 21 U.S.C. section 360bbb-3(b)(1), unless the authorization is terminated or revoked sooner.     Influenza A by PCR NEGATIVE NEGATIVE Final   Influenza B by PCR NEGATIVE NEGATIVE Final    Comment: (NOTE) The Xpert Xpress SARS-CoV-2/FLU/RSV assay is intended as an aid in  the diagnosis of influenza from Nasopharyngeal swab specimens and  should not be used as a sole basis for treatment. Nasal washings and  aspirates are unacceptable for Xpert Xpress SARS-CoV-2/FLU/RSV  testing.  Fact Sheet for Patients: https://www.moore.com/  Fact Sheet for Healthcare Providers: https://www.young.biz/  This test is not yet approved or cleared by the [place] FDA and  has been authorized for detection and/or diagnosis of SARS-CoV-2 by  FDA under an Emergency Use Authorization (EUA). This EUA will remain  in effect (meaning this test can be used) for the duration of the  Covid-19 declaration under Section 564(b)(1) of the Act, 21  U.S.C. section 360bbb-3(b)(1), unless the authorization is  terminated or revoked. Performed at [facility], [address] [place], [place] [address]   Culture, blood (single)     Status: None  (Preliminary result)   Collection Time: 07/15/20 10:02 AM   Specimen: BLOOD  Result Value Ref Range Status   Specimen Description BLOOD RIGHT ANTECUBITAL  Final   Special Requests   Final    BOTTLES DRAWN AEROBIC AND ANAEROBIC Blood Culture adequate volume   Culture  Setup Time   Final    GRAM NEGATIVE RODS IN BOTH AEROBIC AND ANAEROBIC BOTTLES Organism ID to follow CRITICAL RESULT CALLED TO, READ BACK BY AND VERIFIED WITH: ABBY ELLINGTON AT 2774 ON 07/16/2020 [facility]. Performed at [facility], [address] [place], [place] [address]    Culture GRAM NEGATIVE RODS  Final   Report Status PENDING  Incomplete  Blood Culture ID Panel (Reflexed)     Status: Abnormal   Collection Time: 07/15/20 10:02 AM  Result Value  Ref Range Status   Enterococcus faecalis NOT DETECTED NOT DETECTED Final   Enterococcus Faecium NOT DETECTED NOT DETECTED Final   Listeria monocytogenes NOT DETECTED NOT DETECTED Final   Staphylococcus species NOT DETECTED NOT DETECTED Final   Staphylococcus aureus (BCID) NOT DETECTED NOT DETECTED Final   Staphylococcus epidermidis NOT DETECTED NOT DETECTED Final   Staphylococcus lugdunensis NOT DETECTED NOT DETECTED Final   Streptococcus species NOT DETECTED NOT DETECTED Final   Streptococcus agalactiae NOT DETECTED NOT DETECTED Final   Streptococcus pneumoniae NOT DETECTED NOT DETECTED Final   Streptococcus pyogenes NOT DETECTED NOT DETECTED Final   A.calcoaceticus-baumannii NOT DETECTED NOT DETECTED Final   Bacteroides fragilis NOT DETECTED NOT DETECTED Final   Enterobacterales DETECTED (A) NOT DETECTED Final    Comment: Enterobacterales represent a large order of gram negative bacteria, not a single organism. CRITICAL RESULT CALLED TO, READ BACK BY AND VERIFIED WITH: ABBY ELLINGTON AT 0301 ON 07/16/2020 [facility].    Enterobacter cloacae complex NOT DETECTED NOT DETECTED Final   Escherichia coli NOT DETECTED NOT DETECTED Final   Klebsiella aerogenes NOT DETECTED  NOT DETECTED Final   Klebsiella oxytoca NOT DETECTED NOT DETECTED Final   Klebsiella pneumoniae NOT DETECTED NOT DETECTED Final   Proteus species DETECTED (A) NOT DETECTED Final    Comment: CRITICAL RESULT CALLED TO, READ BACK BY AND VERIFIED WITH: ABBY ELLINGTON AT 3143 ON 07/16/2020 [facility].    Salmonella species NOT DETECTED NOT DETECTED Final   Serratia marcescens NOT DETECTED NOT DETECTED Final   Haemophilus influenzae NOT DETECTED NOT DETECTED Final   Neisseria meningitidis NOT DETECTED NOT DETECTED Final   Pseudomonas aeruginosa NOT DETECTED NOT DETECTED Final   Stenotrophomonas maltophilia NOT DETECTED NOT DETECTED Final   Candida albicans NOT DETECTED NOT DETECTED Final   Candida auris NOT DETECTED NOT DETECTED Final   Candida glabrata NOT DETECTED NOT DETECTED Final   Candida krusei NOT DETECTED NOT DETECTED Final   Candida parapsilosis NOT DETECTED NOT DETECTED Final   Candida tropicalis NOT DETECTED NOT DETECTED Final   Cryptococcus neoformans/gattii NOT DETECTED NOT DETECTED Final   CTX-M ESBL NOT DETECTED NOT DETECTED Final   Carbapenem resistance IMP NOT DETECTED NOT DETECTED Final   Carbapenem resistance KPC NOT DETECTED NOT DETECTED Final   Carbapenem resistance NDM NOT DETECTED NOT DETECTED Final   Carbapenem resist OXA 48 LIKE NOT DETECTED NOT DETECTED Final   Carbapenem resistance VIM NOT DETECTED NOT DETECTED Final    Comment: Performed at [facility], [address] [place], [place] [address]  Aerobic/Anaerobic Culture (surgical/deep wound)     Status: None (Preliminary result)   Collection Time: 07/15/20  4:18 PM   Specimen: PATH Other; Tissue  Result Value Ref Range Status   Specimen Description   Final    PELVIS LEFT RENAL PELVIS Performed at [facility], [address] [place], [place] [address]    Special Requests   Final    NONE Performed at [facility], [address] [place], [place] [address]    Gram Stain   Final     ABUNDANT WBC PRESENT, PREDOMINANTLY PMN ABUNDANT GRAM NEGATIVE RODS    Culture   Final    TOO YOUNG TO READ Performed at [facility], [address] [place], [place] [address]    Report Status PENDING  Incomplete     Studies: DG Chest 2 View  Result Date: 07/15/2020 CLINICAL DATA:  Altered mental status with cardiac arrhythmia EXAM: CHEST - 2  VIEW COMPARISON:  April 01, 2020 chest radiograph; chest CT April 03, 2019 1 FINDINGS: There is mild left base atelectasis. No edema or airspace opacity. Heart is upper normal in size with pulmonary vascularity normal. Pacemaker leads are attached to the right atrium and right ventricle. No adenopathy. There is mild degenerative change in the thoracic spine. No pneumothorax. IMPRESSION: Mild left base atelectasis. No edema or airspace opacity. Heart upper normal in size. Pacemaker leads attached to right atrium and right ventricle. No evident adenopathy. Electronically Signed   By: Bretta Bang III M.D.   On: 07/15/2020 10:33   DG OR UROLOGY CYSTO IMAGE ([facility] ONLY)  Result Date: 07/15/2020 There is no interpretation for this exam.  This order is for images obtained during a surgical procedure.  Please See "Surgeries" Tab for more information regarding the procedure.    Scheduled Meds: . vitamin C  1,000 mg Oral Daily  . Chlorhexidine Gluconate Cloth  6 each Topical Daily  . cholecalciferol  1,000 Units Oral Daily  . famotidine  20 mg Oral Daily  . magnesium oxide  400 mg Oral Daily  . nortriptyline  10 mg Oral QHS  . predniSONE  15 mg Oral Q breakfast  . predniSONE  50 mg Oral Once  . zinc sulfate  220 mg Oral Daily   Continuous Infusions: . sodium chloride 75 mL/hr at 07/16/20 1256  . cefTRIAXone (ROCEPHIN)  IV 2 g (07/16/20 1125)    Assessment/Plan:  1. Sepsis present on admission secondary to obstructing kidney stone and pyelonephritis.  Blood cultures positive for Proteus.  Continue Rocephin.  Infectious disease  consultation.  Procalcitonin elevated.  White blood cell count trending better.  Platelet count low. 2. Obstructing kidney stone.  Patient had urgent ureter stent last night by Dr. Lonna Cobb. 3. Essential hypertension.  Blood pressure low this morning will hold antihypertensive medications. 4. Interstitial lung disease on chronic prednisone 5. Chronic systolic congestive heart failure.  Decrease rate of IV fluids to 30 cc an hour hopefully will stop fluids tomorrow. 6. Acute kidney injury secondary to obstructing kidney stone.  Creatinine improved with ureter stent. 7. Thrombocytopenia secondary to sepsis. 8. Elevated troponin likely secondary to demand ischemia.   Code Status:     Code Status Orders  (From admission, onward)         Start     Ordered   07/15/20 1653  Full code  Continuous        07/15/20 1652        Code Status History    Date Active Date Inactive Code Status Order ID Comments User Context   04/02/2020 1017 04/05/2020 1819 Full Code [id number]  Clydie Braun, MD ED   02/26/2016 1832 02/27/2016 1654 Full Code [id number]  Sheilah Pigeon, PA-C Inpatient   02/26/2016 1832 02/26/2016 1832 Full Code [id number]  Regan Lemming, MD Inpatient   Advance Care Planning Activity     Family Communication: Spoke with husband at the bedside Disposition Plan: Status is: Inpatient  Dispo: The patient is from: Home              Anticipated d/c is to: Home              Anticipated d/c date is: Likely another 3 days of IV antibiotics              Patient currently being treated for sepsis with Proteus in the blood cultures.  Continue IV antibiotics.  Consultants:  Urology  Infectious disease  Procedures:  Ureteral stent procedure  Antibiotics:  Rocephin  Time spent: 28 minutes  [employer]

## 2020-07-16 NOTE — Consult Note (Signed)
PHARMACY - PHYSICIAN COMMUNICATION CRITICAL VALUE ALERT - BLOOD CULTURE IDENTIFICATION (BCID)  Anne Rush is an 59 y.o. female who presented to [facility] on 07/15/2020 with a chief complaint of left flank pain.   Assessment:  1 out of 2 bottles growing GNR. BCID + proteus. Source potentially urinary.   Name of physician (or Provider) Contacted: [place]   Current antibiotics: ceftriaxone 1 g daily  Changes to prescribed antibiotics recommended: Change to ceftriaxone 2 g daily. Recommendations accepted by provider  Results for orders placed or performed during the hospital encounter of 07/15/20  Blood Culture ID Panel (Reflexed) (Collected: 07/15/2020 10:02 AM)  Result Value Ref Range   Enterococcus faecalis NOT DETECTED NOT DETECTED   Enterococcus Faecium NOT DETECTED NOT DETECTED   Listeria monocytogenes NOT DETECTED NOT DETECTED   Staphylococcus species NOT DETECTED NOT DETECTED   Staphylococcus aureus (BCID) NOT DETECTED NOT DETECTED   Staphylococcus epidermidis NOT DETECTED NOT DETECTED   Staphylococcus lugdunensis NOT DETECTED NOT DETECTED   Streptococcus species NOT DETECTED NOT DETECTED   Streptococcus agalactiae NOT DETECTED NOT DETECTED   Streptococcus pneumoniae NOT DETECTED NOT DETECTED   Streptococcus pyogenes NOT DETECTED NOT DETECTED   A.calcoaceticus-baumannii NOT DETECTED NOT DETECTED   Bacteroides fragilis NOT DETECTED NOT DETECTED   Enterobacterales DETECTED (A) NOT DETECTED   Enterobacter cloacae complex NOT DETECTED NOT DETECTED   Escherichia coli NOT DETECTED NOT DETECTED   Klebsiella aerogenes NOT DETECTED NOT DETECTED   Klebsiella oxytoca NOT DETECTED NOT DETECTED   Klebsiella pneumoniae NOT DETECTED NOT DETECTED   Proteus species DETECTED (A) NOT DETECTED   Salmonella species NOT DETECTED NOT DETECTED   Serratia marcescens NOT DETECTED NOT DETECTED   Haemophilus influenzae NOT DETECTED NOT DETECTED   Neisseria meningitidis NOT DETECTED NOT DETECTED    Pseudomonas aeruginosa NOT DETECTED NOT DETECTED   Stenotrophomonas maltophilia NOT DETECTED NOT DETECTED   Candida albicans NOT DETECTED NOT DETECTED   Candida auris NOT DETECTED NOT DETECTED   Candida glabrata NOT DETECTED NOT DETECTED   Candida krusei NOT DETECTED NOT DETECTED   Candida parapsilosis NOT DETECTED NOT DETECTED   Candida tropicalis NOT DETECTED NOT DETECTED   Cryptococcus neoformans/gattii NOT DETECTED NOT DETECTED   CTX-M ESBL NOT DETECTED NOT DETECTED   Carbapenem resistance IMP NOT DETECTED NOT DETECTED   Carbapenem resistance KPC NOT DETECTED NOT DETECTED   Carbapenem resistance NDM NOT DETECTED NOT DETECTED   Carbapenem resist OXA 48 LIKE NOT DETECTED NOT DETECTED   Carbapenem resistance VIM NOT DETECTED NOT DETECTED    Ronnald Ramp 07/16/2020  7:41 AM

## 2020-07-16 NOTE — Consult Note (Addendum)
NAME: Anne Rush  DOB: [birth year]  MRN: [id number]  Date/Time: 07/16/2020 4:06 PM  REQUESTING PROVIDER: Dr.Wieting Subjective:  REASON FOR CONSULT: Proteus bacteremia ? Anne Rush is a 59 y.o. female with a history of interstitial lung disease, complete heart block needing pacemaker , HTN, hyperlipidemia presented on 07/13/20 with severe acute  left flank pain , chills and vomiting As per patient she was doing okay on Saturday ( 07/12/20) morning but started having severe left sided  pain from front to back to the flank.  she initially thought ti was shingles like pain but as it was getting worse she came to the ED Sunday past midnight BP 07/13/20 1208 (!) 142/76     Pulse Rate 07/13/20 1208 91     Resp 07/13/20 1208 18     Temp 07/13/20 1208 98.2 F (36.8 C)     Temp Source 07/13/20 1208 Oral     SpO2 07/13/20 1208 94 %     Weight 07/13/20 1207 215 lb (97.5 kg)   WBC 14.7, UA noted 6-10 WBCs . CT renal stone study revealed high-grade obstruction of the LEFT kidney with obstructing calculus at the LEFT ureteral pelvic junction. Several additional calculi within lower pole of the LEFT kidney. Single RIGHT renal calculus. Patient was discharged on Percocet, Phenergan and Flomax and asked to follow with urologist as OP. Saw Dr.Brandon on 07/15/20 and ass he was not looking well she sent her to ED. Pt also was having chills and vomiting Vitals in the ED was  BP 98/63     Pulse Rate (!) 102     Resp (!) 22     Temp 99.9 F (37.7 C)     Temp Source Oral     SpO2 96 %   1. Wbc was 16, HB 12,7, cr 1.73. Blood culture sent and she underwent cystoscopy, left retrograde pyelography-by Dr.Stioff on the same afternoon \  findings were Cystoscopy-cystocele with bladder descensus, bladder mucosa without erythema, solid or papillary lesions Left retrograde pyelogram-moderate left hydronephrosis with proximal hydroureter secondary to proximal ureteral calculus Urine aspirated from left renal  pelvis murky and blood-tinged and was sent for culture  As blood and urine culture positive for proteus I am seeing the patient She is feeling better today She has h/o ILD diagnosed in 2012 and she was followed at [facility], it was thought to be due smoking- she was offered lung biopsy but as she was improving she declined  In June 2017  she had pacemaker placement for CHB ( unknown etiology)  In July  2021 she was hospitalized for worsening SOB at [facility] and found to have GGO in the [place] and seen by pulmonologist .  She was started on prednisone  . Followed by Dr.Murali Workup for ILD ( ANA/ANCA/CCP/RA/hypersensitivty panel all were negative) and steroid being  tapered and now on 5 mg. She has not taken vaccine for SARS COV2  Past Medical History:  Diagnosis Date  . Anxiety   . Family history of ovarian cancer   . Hyperlipidemia   . Hypertension   . Interstitial lung disease (HCC)   . Pollen allergies   . Squamous cell carcinoma 2014   skin cancer  . Subclinical hyperthyroidism 06/17/2014    Past Surgical History:  Procedure Laterality Date  . COSMETIC SURGERY    . CYSTOSCOPY W/ URETERAL STENT PLACEMENT Left 07/15/2020   Procedure: CYSTOSCOPY WITH RETROGRADE PYELOGRAM/URETERAL STENT PLACEMENT;  Surgeon: Riki Altes, MD;  Location: [facility] ORS;  Service: Urology;  Laterality: Left;  . EP IMPLANTABLE DEVICE N/A 02/26/2016   Procedure: Pacemaker Implant;  Surgeon: Will Jorja Loa, MD;  Location: [facility] INVASIVE CV LAB;  Service: Cardiovascular;  Laterality: N/A;  . TUBAL LIGATION      Social History   Socioeconomic History  . Marital status: Married    Spouse name: Not on file  . Number of children: 1  . Years of education: Not on file  . Highest education level: Not on file  Occupational History  . Occupation: house wife    Employer: [employer]  Tobacco Use  . Smoking status: Former Smoker    Packs/day: 0.50    Years: 30.00    Pack years: 15.00    Quit date: 2012    Years since  quitting: 9.8  . Smokeless tobacco: Never Used  Substance and Sexual Activity  . Alcohol use: No  . Drug use: No  . Sexual activity: Yes    Birth control/protection: Post-menopausal  Other Topics Concern  . Not on file  Social History Narrative   Regular exercise-no   Social Determinants of Health   Financial Resource Strain:   . Difficulty of Paying Living Expenses: Not on file  Food Insecurity:   . Worried About Programme researcher, broadcasting/film/video in the Last Year: Not on file  . Ran Out of Food in the Last Year: Not on file  Transportation Needs:   . Lack of Transportation (Medical): Not on file  . Lack of Transportation (Non-Medical): Not on file  Physical Activity:   . Days of Exercise per Week: Not on file  . Minutes of Exercise per Session: Not on file  Stress:   . Feeling of Stress : Not on file  Social Connections:   . Frequency of Communication with Friends and Family: Not on file  . Frequency of Social Gatherings with Friends and Family: Not on file  . Attends Religious Services: Not on file  . Active Member of Clubs or Organizations: Not on file  . Attends Banker Meetings: Not on file  . Marital Status: Not on file  Intimate Partner Violence:   . Fear of Current or Ex-Partner: Not on file  . Emotionally Abused: Not on file  . Physically Abused: Not on file  . Sexually Abused: Not on file    Family History  Problem Relation Age of Onset  . Anxiety disorder Mother   . Hypertension Mother   . Ovarian cancer Mother 52  . Cancer Maternal Aunt        breast  . Heart failure Maternal Grandfather    Allergies  Allergen Reactions  . Codeine Hives and Itching  . Losartan Potassium Other (See Comments)  . Neomycin-Polymyxin-Dexameth Itching, Swelling and Other (See Comments)    Swelling and itching of eye   . Oxycodone-Acetaminophen Other (See Comments)    REACTION: unspecified  . Paroxetine Other (See Comments)    unknown  . Sulfonamide Derivatives Other  (See Comments)    REACTION: unspecified  . Lisinopril Rash  . Sulfa Antibiotics Hives and Rash         ? Current Facility-Administered Medications  Medication Dose Route Frequency Provider Last Rate Last Admin  . 0.9 %  sodium chloride infusion   Intravenous Continuous Alford Highland, MD 30 mL/hr at 07/16/20 1551 Rate Change at 07/16/20 1551  . acetaminophen (TYLENOL) tablet 650 mg  650 mg Oral Q6H PRN Alford Highland, MD      . albuterol (  VENTOLIN HFA) 108 (90 Base) MCG/ACT inhaler 2 puff  2 puff Inhalation Q4H PRN Lorretta Harp, MD      . ascorbic acid (VITAMIN C) tablet 1,000 mg  1,000 mg Oral Daily Lorretta Harp, MD   1,000 mg at 07/16/20 0909  . cefTRIAXone (ROCEPHIN) 2 g in sodium chloride 0.9 % 100 mL IVPB  2 g Intravenous Q24H Ronnald Ramp, RPH 200 mL/hr at 07/16/20 1125 2 g at 07/16/20 1125  . Chlorhexidine Gluconate Cloth 2 % PADS 6 each  6 each Topical Daily Alford Highland, MD   6 each at 07/16/20 1258  . cholecalciferol (VITAMIN D3) tablet 1,000 Units  1,000 Units Oral Daily Lorretta Harp, MD   1,000 Units at 07/16/20 0907  . dextromethorphan-guaiFENesin (MUCINEX DM) 30-600 MG per 12 hr tablet 1 tablet  1 tablet Oral BID PRN Lorretta Harp, MD      . diazepam (VALIUM) tablet 5 mg  5 mg Oral Q8H PRN Lorretta Harp, MD      . famotidine (PEPCID) tablet 20 mg  20 mg Oral Daily Lorretta Harp, MD   20 mg at 07/16/20 0908  . HYDROcodone-acetaminophen (NORCO/VICODIN) 5-325 MG per tablet 1 tablet  1 tablet Oral Q6H PRN Wieting, Richard, MD      . magnesium oxide (MAG-OX) tablet 400 mg  400 mg Oral Daily Lorretta Harp, MD   400 mg at 07/16/20 0907  . morphine 2 MG/ML injection 1 mg  1 mg Intravenous Q4H PRN Lorretta Harp, MD   1 mg at 07/16/20 1554  . nortriptyline (PAMELOR) capsule 10 mg  10 mg Oral QHS Lorretta Harp, MD   10 mg at 07/15/20 2310  . ondansetron (ZOFRAN) injection 4 mg  4 mg Intravenous Q8H PRN Lorretta Harp, MD      . predniSONE (DELTASONE) tablet 15 mg  15 mg Oral Q breakfast Lorretta Harp, MD    15 mg at 07/16/20 0800  . predniSONE (DELTASONE) tablet 50 mg  50 mg Oral Once Lorretta Harp, MD      . zinc sulfate capsule 220 mg  220 mg Oral Daily Tressie Ellis, RPH   220 mg at 07/16/20 7510     Abtx:  Anti-infectives (From admission, onward)   Start     Dose/Rate Route Frequency Ordered Stop   07/16/20 1000  cefTRIAXone (ROCEPHIN) 2 g in sodium chloride 0.9 % 100 mL IVPB        2 g 200 mL/hr over 30 Minutes Intravenous Every 24 hours 07/16/20 0740     07/15/20 1245  cefTRIAXone (ROCEPHIN) 1 g in sodium chloride 0.9 % 100 mL IVPB  Status:  Discontinued        1 g 200 mL/hr over 30 Minutes Intravenous Every 24 hours 07/15/20 1244 07/16/20 0740      REVIEW OF SYSTEMS:  Const:  fever,  chills, negative weight loss Eyes: negative diplopia or visual changes, negative eye pain ENT: negative coryza, negative sore throat Resp:  cough, , dyspnea Cards: negative for chest pain, palpitations, lower extremity edema GU: negative for frequency, dysuria and hematuria GI: as above Skin: negative for rash and pruritus Heme: negative for easy bruising and gum/nose bleeding MS: as above Neurolo:followed by Dr.Potter for headache and left facial numbness Psych: anxiety  Endocrine: subclinical hyperthyroidism Allergy/Immunology- as above : Objective:  VITALS:  BP 107/65 (BP Location: Left Arm)   Pulse 93   Temp 98.3 F (36.8 C) (Oral)   Resp 18   Ht 5\' 1"  (  1.549 m)   Wt 97.5 kg   SpO2 95%   BMI 40.60 kg/m  PHYSICAL EXAM:  General: Alert, cooperative, no distress, appears stated age. Round face Head: Normocephalic, without obvious abnormality, atraumatic. Eyes: Conjunctivae clear, anicteric sclerae. Pupils are equal ENT Nares normal. No drainage or sinus tenderness. Lips, mucosa, and tongue normal. No Thrush Neck: Supple,  Lungs: b/l air entry Decreased bases Heart: s1s2 Pacemaker site scar fine Abdomen: Soft, non-tender,not distended. Bowel sounds normal. No  masses Extremities:  atraumatic, no cyanosis. No edema. No clubbing Skin: No rashes or lesions. Or bruising Lymph: Cervical, supraclavicular normal. Neurologic: Grossly non-focal Pertinent Labs Lab Results CBC    Component Value Date/Time   WBC 14.3 (H) 07/16/2020 0521   RBC 3.67 (L) 07/16/2020 0521   HGB 11.9 (L) 07/16/2020 0521   HCT 33.3 (L) 07/16/2020 0521   PLT 66 (L) 07/16/2020 0521   MCV 90.7 07/16/2020 0521   MCH 32.4 07/16/2020 0521   MCHC 35.7 07/16/2020 0521   RDW 13.0 07/16/2020 0521   LYMPHSABS 0.6 (L) 07/15/2020 1002   MONOABS 0.9 07/15/2020 1002   EOSABS 0.0 07/15/2020 1002   BASOSABS 0.1 07/15/2020 1002    CMP Latest Ref Rng & Units 07/16/2020 07/15/2020 07/15/2020  Glucose 70 - 99 mg/dL 572(I) 203(T) 597(C)  BUN 6 - 20 mg/dL 16(L) 84(T) 36(I)  Creatinine 0.44 - 1.00 mg/dL 6.80 3.21(Y) 2.48(G)  Sodium 135 - 145 mmol/L 135 132(L) 130(L)  Potassium 3.5 - 5.1 mmol/L 3.7 3.4(L) 3.4(L)  Chloride 98 - 111 mmol/L 102 99 98  CO2 22 - 32 mmol/L 23 22 22   Calcium 8.9 - 10.3 mg/dL 5.0(I) 8.3(L) 8.2(L)  Total Protein 6.5 - 8.1 g/dL - - -  Total Bilirubin 0.3 - 1.2 mg/dL - - -  Alkaline Phos 38 - 126 U/L - - -  AST 15 - 41 U/L - - -  ALT 0 - 44 U/L - - -      Microbiology: Recent Results (from the past 240 hour(s))  Respiratory Panel by RT PCR (Flu A&B, Covid) - Nasopharyngeal Swab     Status: None   Collection Time: 07/15/20 10:02 AM   Specimen: Nasopharyngeal Swab  Result Value Ref Range Status   SARS Coronavirus 2 by RT PCR NEGATIVE NEGATIVE Final    Comment: (NOTE) SARS-CoV-2 target nucleic acids are NOT DETECTED.  The SARS-CoV-2 RNA is generally detectable in upper respiratoy specimens during the acute phase of infection. The lowest concentration of SARS-CoV-2 viral copies this assay can detect is 131 copies/mL. A negative result does not preclude SARS-Cov-2 infection and should not be used as the sole basis for treatment or other patient management  decisions. A negative result may occur with  improper specimen collection/handling, submission of specimen other than nasopharyngeal swab, presence of viral mutation(s) within the areas targeted by this assay, and inadequate number of viral copies (<131 copies/mL). A negative result must be combined with clinical observations, patient history, and epidemiological information. The expected result is Negative.  Fact Sheet for Patients:  https://www.moore.com/  Fact Sheet for Healthcare Providers:  https://www.young.biz/  This test is no t yet approved or cleared by the [place] FDA and  has been authorized for detection and/or diagnosis of SARS-CoV-2 by FDA under an Emergency Use Authorization (EUA). This EUA will remain  in effect (meaning this test can be used) for the duration of the COVID-19 declaration under Section 564(b)(1) of the Act, 21 U.S.C. section 360bbb-3(b)(1), unless the authorization is  terminated or revoked sooner.     Influenza A by PCR NEGATIVE NEGATIVE Final   Influenza B by PCR NEGATIVE NEGATIVE Final    Comment: (NOTE) The Xpert Xpress SARS-CoV-2/FLU/RSV assay is intended as an aid in  the diagnosis of influenza from Nasopharyngeal swab specimens and  should not be used as a sole basis for treatment. Nasal washings and  aspirates are unacceptable for Xpert Xpress SARS-CoV-2/FLU/RSV  testing.  Fact Sheet for Patients: https://www.moore.com/  Fact Sheet for Healthcare Providers: https://www.young.biz/  This test is not yet approved or cleared by the [place] FDA and  has been authorized for detection and/or diagnosis of SARS-CoV-2 by  FDA under an Emergency Use Authorization (EUA). This EUA will remain  in effect (meaning this test can be used) for the duration of the  Covid-19 declaration under Section 564(b)(1) of the Act, 21  U.S.C. section 360bbb-3(b)(1), unless the  authorization is  terminated or revoked. Performed at [facility], [address] [place], [place] [address]   Culture, blood (single)     Status: None (Preliminary result)   Collection Time: 07/15/20 10:02 AM   Specimen: BLOOD  Result Value Ref Range Status   Specimen Description BLOOD RIGHT ANTECUBITAL  Final   Special Requests   Final    BOTTLES DRAWN AEROBIC AND ANAEROBIC Blood Culture adequate volume   Culture  Setup Time   Final    GRAM NEGATIVE RODS IN BOTH AEROBIC AND ANAEROBIC BOTTLES Organism ID to follow CRITICAL RESULT CALLED TO, READ BACK BY AND VERIFIED WITH: ABBY ELLINGTON AT 8413 ON 07/16/2020 [facility]. Performed at [facility], [address] [place], [place] [address]    Culture GRAM NEGATIVE RODS  Final   Report Status PENDING  Incomplete  Blood Culture ID Panel (Reflexed)     Status: Abnormal   Collection Time: 07/15/20 10:02 AM  Result Value Ref Range Status   Enterococcus faecalis NOT DETECTED NOT DETECTED Final   Enterococcus Faecium NOT DETECTED NOT DETECTED Final   Listeria monocytogenes NOT DETECTED NOT DETECTED Final   Staphylococcus species NOT DETECTED NOT DETECTED Final   Staphylococcus aureus (BCID) NOT DETECTED NOT DETECTED Final   Staphylococcus epidermidis NOT DETECTED NOT DETECTED Final   Staphylococcus lugdunensis NOT DETECTED NOT DETECTED Final   Streptococcus species NOT DETECTED NOT DETECTED Final   Streptococcus agalactiae NOT DETECTED NOT DETECTED Final   Streptococcus pneumoniae NOT DETECTED NOT DETECTED Final   Streptococcus pyogenes NOT DETECTED NOT DETECTED Final   A.calcoaceticus-baumannii NOT DETECTED NOT DETECTED Final   Bacteroides fragilis NOT DETECTED NOT DETECTED Final   Enterobacterales DETECTED (A) NOT DETECTED Final    Comment: Enterobacterales represent a large order of gram negative bacteria, not a single organism. CRITICAL RESULT CALLED TO, READ BACK BY AND VERIFIED WITH: ABBY ELLINGTON AT 0272 ON  07/16/2020 [facility].    Enterobacter cloacae complex NOT DETECTED NOT DETECTED Final   Escherichia coli NOT DETECTED NOT DETECTED Final   Klebsiella aerogenes NOT DETECTED NOT DETECTED Final   Klebsiella oxytoca NOT DETECTED NOT DETECTED Final   Klebsiella pneumoniae NOT DETECTED NOT DETECTED Final   Proteus species DETECTED (A) NOT DETECTED Final    Comment: CRITICAL RESULT CALLED TO, READ BACK BY AND VERIFIED WITH: ABBY ELLINGTON AT 5366 ON 07/16/2020 [facility].    Salmonella species NOT DETECTED NOT DETECTED Final   Serratia marcescens NOT DETECTED NOT DETECTED Final   Haemophilus influenzae NOT DETECTED NOT DETECTED Final   Neisseria meningitidis NOT DETECTED NOT DETECTED  Final   Pseudomonas aeruginosa NOT DETECTED NOT DETECTED Final   Stenotrophomonas maltophilia NOT DETECTED NOT DETECTED Final   Candida albicans NOT DETECTED NOT DETECTED Final   Candida auris NOT DETECTED NOT DETECTED Final   Candida glabrata NOT DETECTED NOT DETECTED Final   Candida krusei NOT DETECTED NOT DETECTED Final   Candida parapsilosis NOT DETECTED NOT DETECTED Final   Candida tropicalis NOT DETECTED NOT DETECTED Final   Cryptococcus neoformans/gattii NOT DETECTED NOT DETECTED Final   CTX-M ESBL NOT DETECTED NOT DETECTED Final   Carbapenem resistance IMP NOT DETECTED NOT DETECTED Final   Carbapenem resistance KPC NOT DETECTED NOT DETECTED Final   Carbapenem resistance NDM NOT DETECTED NOT DETECTED Final   Carbapenem resist OXA 48 LIKE NOT DETECTED NOT DETECTED Final   Carbapenem resistance VIM NOT DETECTED NOT DETECTED Final    Comment: Performed at [facility], [address] [place], [place] [address]  Aerobic/Anaerobic Culture (surgical/deep wound)     Status: None (Preliminary result)   Collection Time: 07/15/20  4:18 PM   Specimen: PATH Other; Tissue  Result Value Ref Range Status   Specimen Description   Final    PELVIS LEFT RENAL PELVIS Performed at [facility], [address] [place], [place] [address]    Special Requests   Final    NONE Performed at [facility], [address] [place], [place] [address]    Gram Stain   Final    ABUNDANT WBC PRESENT, PREDOMINANTLY PMN ABUNDANT GRAM NEGATIVE RODS    Culture   Final    TOO YOUNG TO READ Performed at [facility], [address] [place], [place] [address]    Report Status PENDING  Incomplete    IMAGING RESULTS:  I have personally reviewed the films High-grade obstruction of the LEFT kidney with obstructing calculus at the LEFT ureteral pelvic junction. 2. Several additional calculi within lower pole of the LEFT kidney. 3. Single RIGHT renal calculus. EKG- paced rhythm ?  Impression/Recommendation  Proteus bacteremia and proteus UTI due to obstructed left kidney from left PUJ stone S/p left ureteral stent placement Currently on ceftriaxone- await sensi for final antibiotic recommendation- will need 2 weeks of antibiotic for complicated UTI Currently no concern for cardiac device infection  AKI due to the obstruction, infection -improved  Interstitial lung disease on prednisone  Complete Heart block - PPM placed in June 2017- no etiology found With ILD and CHB sarcoid may have to be considered and worked up as OP ?  __________________________________________________ Discussed with patient,and daughter  requesting provider Note:  This document was prepared using Conservation officer, historic buildings and may include unintentional dictation errors.

## 2020-07-17 DIAGNOSIS — N10 Acute tubulo-interstitial nephritis: Secondary | ICD-10-CM | POA: Diagnosis not present

## 2020-07-17 DIAGNOSIS — R55 Syncope and collapse: Secondary | ICD-10-CM

## 2020-07-17 DIAGNOSIS — J849 Interstitial pulmonary disease, unspecified: Secondary | ICD-10-CM

## 2020-07-17 DIAGNOSIS — R6520 Severe sepsis without septic shock: Secondary | ICD-10-CM

## 2020-07-17 LAB — GLUCOSE, CAPILLARY
Glucose-Capillary: 115 mg/dL — ABNORMAL HIGH (ref 70–99)
Glucose-Capillary: 185 mg/dL — ABNORMAL HIGH (ref 70–99)
Glucose-Capillary: 160 mg/dL — ABNORMAL HIGH (ref 70–99)
Glucose-Capillary: 113 mg/dL — ABNORMAL HIGH (ref 70–99)

## 2020-07-17 LAB — CBC WITH DIFFERENTIAL/PLATELET
Abs Immature Granulocytes: 0.09 10*3/uL — ABNORMAL HIGH (ref 0.00–0.07)
Basophils Absolute: 0.1 10*3/uL (ref 0.0–0.1)
Basophils Relative: 0 %
Eosinophils Absolute: 0 10*3/uL (ref 0.0–0.5)
Eosinophils Relative: 0 %
HCT: 36.6 % (ref 36.0–46.0)
Hemoglobin: 12.9 g/dL (ref 12.0–15.0)
Immature Granulocytes: 1 %
Lymphocytes Relative: 10 %
Lymphs Abs: 1.3 10*3/uL (ref 0.7–4.0)
MCH: 32.1 pg (ref 26.0–34.0)
MCHC: 35.2 g/dL (ref 30.0–36.0)
MCV: 91 fL (ref 80.0–100.0)
Monocytes Absolute: 0.9 10*3/uL (ref 0.1–1.0)
Monocytes Relative: 7 %
Neutro Abs: 11.4 10*3/uL — ABNORMAL HIGH (ref 1.7–7.7)
Neutrophils Relative %: 82 %
Platelets: 94 10*3/uL — ABNORMAL LOW (ref 150–400)
RBC: 4.02 MIL/uL (ref 3.87–5.11)
RDW: 13.1 % (ref 11.5–15.5)
WBC: 13.7 10*3/uL — ABNORMAL HIGH (ref 4.0–10.5)
nRBC: 0 % (ref 0.0–0.2)

## 2020-07-17 LAB — BASIC METABOLIC PANEL
Anion gap: 9 (ref 5–15)
BUN: 21 mg/dL — ABNORMAL HIGH (ref 6–20)
CO2: 27 mmol/L (ref 22–32)
Calcium: 8.4 mg/dL — ABNORMAL LOW (ref 8.9–10.3)
Chloride: 102 mmol/L (ref 98–111)
Creatinine, Ser: 0.67 mg/dL (ref 0.44–1.00)
GFR, Estimated: >60 mL/min (ref >60)
Glucose, Bld: 105 mg/dL — ABNORMAL HIGH (ref 70–99)
Potassium: 3.6 mmol/L (ref 3.5–5.1)
Sodium: 138 mmol/L (ref 135–145)

## 2020-07-17 LAB — HEMOGLOBIN A1C
Hgb A1c MFr Bld: 6.2 % — ABNORMAL HIGH (ref 4.8–5.6)
Mean Plasma Glucose: 131 mg/dL

## 2020-07-17 MED ORDER — SODIUM CHLORIDE 0.9 % IV BOLUS
250.0000 mL | Freq: Once | INTRAVENOUS | Status: AC
  Administered 2020-07-17: 250 mL via INTRAVENOUS

## 2020-07-17 MED ORDER — METRONIDAZOLE IN NACL 5-0.79 MG/ML-% IV SOLN
500.0000 mg | Freq: Three times a day (TID) | INTRAVENOUS | Status: DC
  Administered 2020-07-17 – 2020-07-18 (×3): 500 mg via INTRAVENOUS
  Filled 2020-07-17 (×8): qty 100

## 2020-07-17 MED ORDER — SODIUM CHLORIDE 0.9 % IV SOLN
INTRAVENOUS | Status: DC | PRN
  Administered 2020-07-17: 250 mL via INTRAVENOUS

## 2020-07-17 MED ORDER — CARVEDILOL 12.5 MG PO TABS
12.5000 mg | ORAL_TABLET | Freq: Two times a day (BID) | ORAL | Status: DC
  Administered 2020-07-17: 12.5 mg via ORAL
  Filled 2020-07-17: qty 1

## 2020-07-17 NOTE — Evaluation (Signed)
Physical Therapy Evaluation Patient Details Name: Anne Rush MRN: [id number] DOB: [birth year] Today's Date: 07/17/2020   History of Present Illness  Anne Rush is a 59 y.o. female with a history of interstitial lung disease, complete heart block needing pacemaker , HTN, hyperlipidemia presented on 07/13/20 with severe acute  left flank pain , chills and vomiting.     Clinical Impression  Upon arrival to room, Dr. Leslye Peer informed therapist that rapid response call this morning may have been performed due to vasovagal response during toileting.  Dr. Leslye Peer advised to perform orthostatic for precautionary reasons and are noted below.  Pt received in supine position with HOB elevated upon arriving to room.  Pt notes that she passed out this morning when attempting to void on the bedside commode.  Pt able to perform bed-level exercises without much difficulty and demonstrates decreased strength when testing MMT.  Pt performed bed mobility without much difficulty.  Pt then transferred to sitting EOB and reported that she needed to void after taking vitals.  Pt performed modI transfer to bedside commode to void.  Pt requested therapist and husband step outside while performing self-care.  Therapist called upon after pt finished voiding.  CGA was utilized for gait training down hallway and pt was able to walk ~80 ft before reporting fatigue.  Pt has decreased gait speed initially, but when seeing her room, pt increased gait speed.  Pt encouraged to remain steady when ambulating and not rush back to room in order to remain safe.  Pt then transferred back to bed where all needs were met and call bell was within reach.  Discussed options for d/c and pt would benefit from HHPT in order to regain mobility status and strength while at home.  Pt to benefit from mobility while during stay in hospital from nursing staff and mobility techs.  Pt and husband both would like to regain LE strength before being d/c.       Follow Up Recommendations Home health PT    Equipment Recommendations  None recommended by PT    Recommendations for Other Services       Precautions / Restrictions Restrictions Weight Bearing Restrictions: No      Mobility  Bed Mobility Overal bed mobility: Modified Independent             General bed mobility comments: Pt able to perform bed mobility with increased time.    Transfers Overall transfer level: Needs assistance Equipment used: None Transfers: Sit to/from Stand Sit to Stand: Min guard            Ambulation/Gait Ambulation/Gait assistance: Min guard Gait Distance (Feet): 80 Feet Assistive device: None Gait Pattern/deviations: Step-through pattern;Decreased stride length Gait velocity: decreased   General Gait Details: Pt able to ambulate well, but experiences fatigue quickly and requires verbal cuing for liftin of feet for clearance in hallway.  Stairs            Wheelchair Mobility    Modified Rankin (Stroke Patients Only)       Balance Overall balance assessment: Independent                                           Pertinent Vitals/Pain Pain Assessment: No/denies pain    Home Living Family/patient expects to be discharged to:: Private residence Living Arrangements: Spouse/significant other Available Help at Discharge: Family Type of  Home: House Home Access: Stairs to enter Entrance Stairs-Rails: Psychiatric nurse of Steps: 3-5 Home Layout: Two level;Able to live on main level with bedroom/bathroom Home Equipment: None      Prior Function Level of Independence: Independent               Hand Dominance        Extremity/Trunk Assessment   Upper Extremity Assessment Upper Extremity Assessment: Overall WFL for tasks assessed    Lower Extremity Assessment Lower Extremity Assessment: Generalized weakness    Cervical / Trunk Assessment Cervical / Trunk Assessment: Normal   Communication   Communication: No difficulties  Cognition Arousal/Alertness: Awake/alert Behavior During Therapy: WFL for tasks assessed/performed Overall Cognitive Status: Within Functional Limits for tasks assessed                                        General Comments      Exercises Total Joint Exercises Ankle Circles/Pumps: AROM;Strengthening;Both;10 reps;Supine Quad Sets: AROM;Strengthening;Both;10 reps;Supine Gluteal Sets: AROM;Strengthening;Both;10 reps;Supine Hip ABduction/ADduction: AROM;Strengthening;10 reps;Supine Straight Leg Raises: AROM;Strengthening;10 reps;Supine Marching in Standing: AROM;Strengthening;Both;10 reps;Standing Other Exercises Other Exercises: Pt educated on roles of PT and services providede during hospital stay. Other Exercises: Pt received education on gait training during ambulation in the hallway in order to increase hip flexion for added foot clearance.   Assessment/Plan    PT Assessment Patient needs continued PT services  PT Problem List Decreased strength;Decreased activity tolerance;Decreased balance;Decreased mobility       PT Treatment Interventions Gait training;Functional mobility training;Therapeutic activities;Therapeutic exercise;Balance training;Patient/family education    PT Goals (Current goals can be found in the Care Plan section)  Acute Rehab PT Goals Patient Stated Goal: To go home. PT Goal Formulation: With patient/family Time For Goal Achievement: 07/31/20 Potential to Achieve Goals: Good    Frequency Min 2X/week   Barriers to discharge        Co-evaluation               AM-PAC PT "6 Clicks" Mobility  Outcome Measure Help needed turning from your back to your side while in a flat bed without using bedrails?: A Little Help needed moving from lying on your back to sitting on the side of a flat bed without using bedrails?: A Little Help needed moving to and from a bed to a chair (including  a wheelchair)?: A Little Help needed standing up from a chair using your arms (e.g., wheelchair or bedside chair)?: A Little Help needed to walk in hospital room?: A Little Help needed climbing 3-5 steps with a railing? : A Lot 6 Click Score: 17    End of Session Equipment Utilized During Treatment: Gait belt Activity Tolerance: Patient tolerated treatment well Patient left: in bed;with call bell/phone within reach;with bed alarm set;with family/visitor present Nurse Communication: Mobility status PT Visit Diagnosis: Unsteadiness on feet (R26.81);Other abnormalities of gait and mobility (R26.89);Muscle weakness (generalized) (M62.81);Difficulty in walking, not elsewhere classified (R26.2)    Time: 7026-3785 PT Time Calculation (min) (ACUTE ONLY): 56 min   Charges:   PT Evaluation $PT Eval Low Complexity: 1 Low PT Treatments $Gait Training: 8-22 mins $Therapeutic Exercise: 8-22 mins $Therapeutic Activity: 8-22 mins        Gwenlyn Saran, PT, DPT 07/17/20, 4:06 PM

## 2020-07-17 NOTE — Progress Notes (Signed)
Ch visited with Pt after RR. Pt looked anxious. Ch introduced self and checked in on her. Pt's husband [name] at bedside. Pt reports sepsis and catheter issues. Ch asked if they would like prayer, both agreed. Chaplain prayed with them, both tearful. Pt says "she needed that." Ch let them know about anytime availability of chaplains to request to RNs for support. They were grateful for visit.

## 2020-07-17 NOTE — Progress Notes (Signed)
ID Pt doing better No fever  Patient Vitals for the past 24 hrs:  BP Temp Temp src Pulse Resp SpO2 Weight  07/17/20 2043 125/79 98.2 F (36.8 C) Oral 81 18 95 % --  07/17/20 1536 -- -- -- -- -- 98 % --  07/17/20 1524 113/77 98.8 F (37.1 C) Oral 84 14 99 % --  07/17/20 1149 104/67 98.1 F (36.7 C) Oral -- 18 -- --  07/17/20 0809 -- 97.8 F (36.6 C) Oral (!) 105 16 96 % --  07/17/20 0807 135/86 -- -- -- 19 -- --  07/17/20 0758 (!) 116/94 -- -- (!) 103 -- -- --  07/17/20 0700 120/82 -- -- -- 20 -- --  07/17/20 0643 130/88 -- -- -- 20 -- --  07/17/20 0641 (!) 119/94 -- -- -- 20 -- --  07/17/20 0636 (!) 76/49 -- -- -- 20 -- --  07/17/20 0446 135/84 98.7 F (37.1 C) Oral (!) 104 -- 96 % 96.8 kg   O/e awake and alert No distress Chest b/l air entry [id number] Abd soft CNS non focal  CBC Latest Ref Rng & Units 07/17/2020 07/16/2020 07/15/2020  WBC 4.0 - 10.5 K/uL 13.7(H) 14.3(H) 16.0(H)  Hemoglobin 12.0 - 15.0 g/dL 37.9 11.9(L) 12.7  Hematocrit 36 - 46 % 36.6 33.3(L) 35.5(L)  Platelets 150 - 400 K/uL 94(L) 66(L) 92(L)    CMP Latest Ref Rng & Units 07/17/2020 07/16/2020 07/15/2020  Glucose 70 - 99 mg/dL 024(O) 973(Z) 329(J)  BUN 6 - 20 mg/dL 24(Q) 68(T) 41(D)  Creatinine 0.44 - 1.00 mg/dL 6.22 2.97 9.89(Q)  Sodium 135 - 145 mmol/L 138 135 132(L)  Potassium 3.5 - 5.1 mmol/L 3.6 3.7 3.4(L)  Chloride 98 - 111 mmol/L 102 102 99  CO2 22 - 32 mmol/L 27 23 22   Calcium 8.9 - 10.3 mg/dL ) 1.1(H) 8.3(L)  Total Protein 6.5 - 8.1 g/dL - - -  Total Bilirubin 0.3 - 1.2 mg/dL - - -  Alkaline Phos 38 - 126 U/L - - -  AST 15 - 41 U/L - - -  ALT 0 - 44 U/L - - -   Micro Blood culture proteus Urine culture pending  Impression/recommendation  Proteus bacteremia and proteus UTI due to obstructed left kidney from left PUJ stone S/p left ureteral stent placement Currently on ceftriaxone- await sensi for final antibiotic recommendation- will need 2 weeks of antibiotic for complicated  UTI Currently no concern for cardiac device infection Will add flagyl as there is a question of  anerobe in  the urine  AKI due to the obstruction, infection -improved  Interstitial lung disease on prednisone  Complete Heart block - PPM placed in June 2017- no etiology found With ILD and CHB sarcoid may have to be considered and worked up as OP  Discussed the management with the patient

## 2020-07-17 NOTE — Progress Notes (Signed)
Urology Consult Follow Up  Subjective: Patient with an episode of hypotension after standing to use the bedside commode to urinate and have a bowel movement.   She is given a 250 mL fluid bolus and her blood pressure stabilized.  She is now resting in bed.  She states that she has been having good urine output.    Her serum creatinine continues to trend downward and is now at 0.67 from 0.79 yesterday.  Her WBC count is also improved from 14.3 yesterday to 13.7 today.  Her platelet count has also rebounded a little from 66 yesterday to 94 today.  Blood cultures positive for Proteus.  Urine culture is still pending.  Anti-infectives: Anti-infectives (From admission, onward)   Start     Dose/Rate Route Frequency Ordered Stop   07/16/20 1000  cefTRIAXone (ROCEPHIN) 2 g in sodium chloride 0.9 % 100 mL IVPB        2 g 200 mL/hr over 30 Minutes Intravenous Every 24 hours 07/16/20 0740     07/15/20 1245  cefTRIAXone (ROCEPHIN) 1 g in sodium chloride 0.9 % 100 mL IVPB  Status:  Discontinued        1 g 200 mL/hr over 30 Minutes Intravenous Every 24 hours 07/15/20 1244 07/16/20 0740      Current Facility-Administered Medications  Medication Dose Route Frequency Provider Last Rate Last Admin  . acetaminophen (TYLENOL) tablet 650 mg  650 mg Oral Q6H PRN Wieting, Richard, MD      . albuterol (VENTOLIN HFA) 108 (90 Base) MCG/ACT inhaler 2 puff  2 puff Inhalation Q4H PRN Lorretta Harp, MD      . ascorbic acid (VITAMIN C) tablet 1,000 mg  1,000 mg Oral Daily Lorretta Harp, MD   1,000 mg at 07/16/20 0909  . carvedilol (COREG) tablet 12.5 mg  12.5 mg Oral BID WC Alford Highland, MD   12.5 mg at 07/17/20 0758  . cefTRIAXone (ROCEPHIN) 2 g in sodium chloride 0.9 % 100 mL IVPB  2 g Intravenous Q24H Ronnald Ramp, RPH 200 mL/hr at 07/17/20 0927 2 g at 07/17/20 0927  . Chlorhexidine Gluconate Cloth 2 % PADS 6 each  6 each Topical Daily Alford Highland, MD   6 each at 07/16/20 1258  . cholecalciferol (VITAMIN  D3) tablet 1,000 Units  1,000 Units Oral Daily Lorretta Harp, MD   1,000 Units at 07/16/20 0907  . dextromethorphan-guaiFENesin (MUCINEX DM) 30-600 MG per 12 hr tablet 1 tablet  1 tablet Oral BID PRN Lorretta Harp, MD      . diazepam (VALIUM) tablet 5 mg  5 mg Oral Q8H PRN Lorretta Harp, MD      . famotidine (PEPCID) tablet 20 mg  20 mg Oral Daily Lorretta Harp, MD   20 mg at 07/17/20 0925  . HYDROcodone-acetaminophen (NORCO/VICODIN) 5-325 MG per tablet 1 tablet  1 tablet Oral Q6H PRN Alford Highland, MD   1 tablet at 07/17/20 0758  . magnesium oxide (MAG-OX) tablet 400 mg  400 mg Oral Daily Lorretta Harp, MD   400 mg at 07/16/20 1610  . morphine 2 MG/ML injection 1 mg  1 mg Intravenous Q4H PRN Lorretta Harp, MD   1 mg at 07/16/20 1554  . nortriptyline (PAMELOR) capsule 10 mg  10 mg Oral QHS Lorretta Harp, MD   10 mg at 07/15/20 2310  . ondansetron (ZOFRAN) injection 4 mg  4 mg Intravenous Q8H PRN Lorretta Harp, MD      . predniSONE (DELTASONE) tablet 15 mg  15 mg Oral Q breakfast Lorretta Harp, MD   15 mg at 07/17/20 0758  . predniSONE (DELTASONE) tablet 50 mg  50 mg Oral Once Lorretta Harp, MD      . zinc sulfate capsule 220 mg  220 mg Oral Daily Tressie Ellis, RPH   220 mg at 07/16/20 9390     Objective: Vital signs in last 24 hours: Temp:  [97.6 F (36.4 C)-98.7 F (37.1 C)] 97.8 F (36.6 C) (10/28 0809) Pulse Rate:  [93-105] 105 (10/28 0809) Resp:  [16-20] 16 (10/28 0809) BP: (76-135)/(49-94) 135/86 (10/28 0807) SpO2:  [93 %-96 %] 96 % (10/28 0809) Weight:  [96.8 kg] 96.8 kg (10/28 0446)  Intake/Output from previous day: 10/27 0701 - 10/28 0700 In: 100 [IV Piggyback:100] Out: 3525 [Urine:3525] Intake/Output this shift: Total I/O In: 120 [P.O.:120] Out: -    Physical Exam Constitutional:  Well nourished. Alert and oriented, No acute distress. Tearful.  HEENT: [place] AT, moist mucus membranes.  Trachea midline, no masses. Cardiovascular: No clubbing, cyanosis, or edema. Respiratory: Normal respiratory  effort, no increased work of breathing. Neurologic: Grossly intact, no focal deficits, moving all 4 extremities. Psychiatric: Normal mood and affect.   Lab Results:  Recent Labs    07/16/20 0521 07/17/20 0441  WBC 14.3* 13.7*  HGB 11.9* 12.9  HCT 33.3* 36.6  PLT 66* 94*   BMET Recent Labs    07/16/20 0521 07/17/20 0441  NA 135 138  K 3.7 3.6  CL 102 102  CO2 23 27  GLUCOSE 165* 105*  BUN 27* 21*  CREATININE 0.79 0.67  CALCIUM 8.7* 8.4*   PT/INR Recent Labs    07/15/20 1002  LABPROT 14.8  INR 1.2   ABG No results for input(s): PHART, HCO3 in the last 72 hours.  Invalid input(s): PCO2, PO2  Studies/Results: DG OR UROLOGY CYSTO IMAGE ([facility] ONLY)  Result Date: 07/15/2020 There is no interpretation for this exam.  This order is for images obtained during a surgical procedure.  Please See "Surgeries" Tab for more information regarding the procedure.     Assessment and Plan: 59 year old female interstitial lung disease, pacemaker and CHF who underwent emergent left ureteral stent placement on 07/15/2020 for decompression and source after being managed for a left obstructing stone in the setting of sepsis.  Foley has been discontinued and patient voiding well.  ID has seen and evaluated the patient has recommended to continue the ceftriaxone while waiting sensitivities for final antibiotic recommendation and recommend 2 weeks of antibiotics for her UTI  Orthostatic hypotensive event seems to have been relieved with fluid bolus.    Would try patient on oral hydrocodone/acetaminophen for pain control versus morphine as morphine can contribute to hypotension  She is tentatively scheduled for outpatient ureteroscopy and laser lithotripsy, but she will need preoperative clearance from pulmonology and cardiology prior to proceeding.     LOS: 2 days    [facility] 07/17/2020

## 2020-07-17 NOTE — Progress Notes (Signed)
°   07/17/20 0636  Assess: MEWS Score  BP (!) 76/49  ECG Heart Rate 60  Resp 20  Level of Consciousness Alert  Assess: MEWS Score  MEWS Temp 0  MEWS Systolic 2  MEWS Pulse 0  MEWS RR 0  MEWS LOC 0  MEWS Score 2  MEWS Score Color Yellow  Assess: if the MEWS score is Yellow or Red  Were vital signs taken at a resting state? Yes  Focused Assessment Change from prior assessment (see assessment flowsheet)  Early Detection of Sepsis Score *See Row Information* Low  MEWS guidelines implemented *See Row Information* Yes  Treat  MEWS Interventions Escalated (See documentation below)  Take Vital Signs  Increase Vital Sign Frequency  Yellow: Q 2hr X 2 then Q 4hr X 2, if remains yellow, continue Q 4hrs  Escalate  MEWS: Escalate Yellow: discuss with charge nurse/RN and consider discussing with provider and RRT  Notify: Charge Nurse/RN  Name of Charge Nurse/RN Notified Alisa RN  Date Charge Nurse/RN Notified 07/17/20  Time Charge Nurse/RN Notified 0636  Notify: Provider  Provider Name/Title Webb Silversmith NP  Date Provider Notified 07/17/20  Time Provider Notified [phone]  Notification Type Page  Notification Reason Change in status  Response See new orders  Date of Provider Response 07/17/20  Time of Provider Response [phone]  Notify: Rapid Response  Date Rapid Response Notified 07/17/20  Time Rapid Response Notified 0641  Document  Patient Outcome Stabilized after interventions  Progress note created (see row info) Yes  Patient's blood pressure dropped after being transferred from bed to [facility]. Patient became nauseous and diaphoretic. RRT called. Patient transferred back to bed. 250 ml fluid bolus given per order. BP stable after interventions. Patient resting quietly at this time

## 2020-07-17 NOTE — Progress Notes (Signed)
Patient ID: Anne Rush, female   DOB: [birth year], 59 y.o.   MRN: [id number] [facility] PROGRESS NOTE  Amyia Lodwick Falco DJT:[id number] DOB: 1960/12/11 DOA: 07/15/2020 PCP: [name], MD  HPI/Subjective: Patient had an episode today where she got up to use the commode and did not feel well and passed out after urination.  They light her back down currently feels a little bit better.  Admitted with sepsis and obstructing kidney stone.  Objective: Vitals:   07/17/20 1149 07/17/20 1524  BP: 104/67 113/77  Pulse:  84  Resp: 18 14  Temp: 98.1 F (36.7 C) 98.8 F (37.1 C)  SpO2:  99%    Intake/Output Summary (Last 24 hours) at 07/17/2020 1533 Last data filed at 07/17/2020 1400 Gross per 24 hour  Intake 340 ml  Output 2300 ml  Net -1960 ml   Filed Weights   07/15/20 1801 07/16/20 0515 07/17/20 0446  Weight: 99.8 kg 97.5 kg 96.8 kg    ROS: Review of Systems  Respiratory: Negative for cough and shortness of breath.   Cardiovascular: Negative for chest pain.  Gastrointestinal: Positive for abdominal pain. Negative for nausea and vomiting.   Exam: Physical Exam HENT:     Head: Normocephalic.     Mouth/Throat:     Pharynx: No oropharyngeal exudate.  Eyes:     General: Lids are normal.     Conjunctiva/sclera: Conjunctivae normal.     Pupils: Pupils are equal, round, and reactive to light.  Cardiovascular:     Rate and Rhythm: Normal rate and regular rhythm.     Heart sounds: Normal heart sounds, S1 normal and S2 normal.  Pulmonary:     Breath sounds: No decreased breath sounds, wheezing, rhonchi or rales.  Abdominal:     Palpations: Abdomen is soft.     Tenderness: There is abdominal tenderness in the left lower quadrant.  Musculoskeletal:     Right lower leg: No swelling.     Left lower leg: No swelling.  Skin:    General: Skin is warm.     Findings: No rash.  Neurological:     Mental Status: She is alert and oriented to person, place, and time.        Data Reviewed: Basic Metabolic Panel: Recent Labs  Lab 07/15/20 1002 07/15/20 1718 07/15/20 2144 07/16/20 0521 07/17/20 0441  NA 126* 130* 132* 135 138  K 3.4* 3.4* 3.4* 3.7 3.6  CL 93* 98 99 102 102  CO2 21* 22 22 23 27   GLUCOSE 163* 133* 162* 165* 105*  BUN 28* 28* 30* 27* 21*  CREATININE 1.73* 1.20* 1.21* 0.79 0.67  CALCIUM 8.6* 8.2* 8.3* 8.7* 8.4*  MG  --  1.5*  --   --   --   PHOS  --  2.0*  --   --   --    Liver Function Tests: Recent Labs  Lab 07/13/20 1214 07/15/20 1002  AST 22 26  ALT 20 15  ALKPHOS 37* 51  BILITOT 1.5* 1.7*  PROT 7.3 6.8  ALBUMIN 4.3 3.4*   Recent Labs  Lab 07/13/20 1214  LIPASE 25   CBC: Recent Labs  Lab 07/13/20 1211 07/15/20 1002 07/16/20 0521 07/17/20 0441  WBC 14.7* 16.0* 14.3* 13.7*  NEUTROABS  --  14.2*  --  11.4*  HGB 14.2 12.7 11.9* 12.9  HCT 39.7 35.5* 33.3* 36.6  MCV 90.6 89.2 90.7 91.0  PLT 209 92* 66* 94*   BNP (last 3 results) Recent Labs  04/02/20 1857 06/21/20 1330  BNP 21.9 171.5*    CBG: Recent Labs  Lab 07/16/20 0842 07/16/20 1204 07/16/20 1703 07/17/20 0641 07/17/20 1147  GLUCAP 139* 151* 149* 115* 185*    Recent Results (from the past 240 hour(s))  Respiratory Panel by RT PCR (Flu A&B, Covid) - Nasopharyngeal Swab     Status: None   Collection Time: 07/15/20 10:02 AM   Specimen: Nasopharyngeal Swab  Result Value Ref Range Status   SARS Coronavirus 2 by RT PCR NEGATIVE NEGATIVE Final    Comment: (NOTE) SARS-CoV-2 target nucleic acids are NOT DETECTED.  The SARS-CoV-2 RNA is generally detectable in upper respiratoy specimens during the acute phase of infection. The lowest concentration of SARS-CoV-2 viral copies this assay can detect is 131 copies/mL. A negative result does not preclude SARS-Cov-2 infection and should not be used as the sole basis for treatment or other patient management decisions. A negative result may occur with  improper specimen collection/handling,  submission of specimen other than nasopharyngeal swab, presence of viral mutation(s) within the areas targeted by this assay, and inadequate number of viral copies (<131 copies/mL). A negative result must be combined with clinical observations, patient history, and epidemiological information. The expected result is Negative.  Fact Sheet for Patients:  https://www.moore.com/  Fact Sheet for Healthcare Providers:  https://www.young.biz/  This test is no t yet approved or cleared by the [place] FDA and  has been authorized for detection and/or diagnosis of SARS-CoV-2 by FDA under an Emergency Use Authorization (EUA). This EUA will remain  in effect (meaning this test can be used) for the duration of the COVID-19 declaration under Section 564(b)(1) of the Act, 21 U.S.C. section 360bbb-3(b)(1), unless the authorization is terminated or revoked sooner.     Influenza A by PCR NEGATIVE NEGATIVE Final   Influenza B by PCR NEGATIVE NEGATIVE Final    Comment: (NOTE) The Xpert Xpress SARS-CoV-2/FLU/RSV assay is intended as an aid in  the diagnosis of influenza from Nasopharyngeal swab specimens and  should not be used as a sole basis for treatment. Nasal washings and  aspirates are unacceptable for Xpert Xpress SARS-CoV-2/FLU/RSV  testing.  Fact Sheet for Patients: https://www.moore.com/  Fact Sheet for Healthcare Providers: https://www.young.biz/  This test is not yet approved or cleared by the [place] FDA and  has been authorized for detection and/or diagnosis of SARS-CoV-2 by  FDA under an Emergency Use Authorization (EUA). This EUA will remain  in effect (meaning this test can be used) for the duration of the  Covid-19 declaration under Section 564(b)(1) of the Act, 21  U.S.C. section 360bbb-3(b)(1), unless the authorization is  terminated or revoked. Performed at [facility], [address] [place], [place] [address]   Culture, blood (single)     Status: Abnormal (Preliminary result)   Collection Time: 07/15/20 10:02 AM   Specimen: BLOOD  Result Value Ref Range Status   Specimen Description   Final    BLOOD RIGHT ANTECUBITAL Performed at [facility], [address] [place], [place] [address]    Special Requests   Final    BOTTLES DRAWN AEROBIC AND ANAEROBIC Blood Culture adequate volume Performed at [facility], [address] [place], [place] [address]    Culture  Setup Time   Final    GRAM NEGATIVE RODS IN BOTH AEROBIC AND ANAEROBIC BOTTLES CRITICAL RESULT CALLED TO, READ BACK BY AND VERIFIED WITH: ABBY ELLINGTON AT 3428 ON 07/16/2020 [facility].    Culture (  A)  Final    PROTEUS MIRABILIS SUSCEPTIBILITIES TO FOLLOW Performed at [facility], [address] [place], [place] [address]    Report Status PENDING  Incomplete  Blood Culture ID Panel (Reflexed)     Status: Abnormal   Collection Time: 07/15/20 10:02 AM  Result Value Ref Range Status   Enterococcus faecalis NOT DETECTED NOT DETECTED Final   Enterococcus Faecium NOT DETECTED NOT DETECTED Final   Listeria monocytogenes NOT DETECTED NOT DETECTED Final   Staphylococcus species NOT DETECTED NOT DETECTED Final   Staphylococcus aureus (BCID) NOT DETECTED NOT DETECTED Final   Staphylococcus epidermidis NOT DETECTED NOT DETECTED Final   Staphylococcus lugdunensis NOT DETECTED NOT DETECTED Final   Streptococcus species NOT DETECTED NOT DETECTED Final   Streptococcus agalactiae NOT DETECTED NOT DETECTED Final   Streptococcus pneumoniae NOT DETECTED NOT DETECTED Final   Streptococcus pyogenes NOT DETECTED NOT DETECTED Final   A.calcoaceticus-baumannii NOT DETECTED NOT DETECTED Final   Bacteroides fragilis NOT DETECTED NOT DETECTED Final   Enterobacterales DETECTED (A) NOT DETECTED Final    Comment: Enterobacterales represent a large order of gram negative bacteria, not a  single organism. CRITICAL RESULT CALLED TO, READ BACK BY AND VERIFIED WITH: ABBY ELLINGTON AT 6606 ON 07/16/2020 [facility].    Enterobacter cloacae complex NOT DETECTED NOT DETECTED Final   Escherichia coli NOT DETECTED NOT DETECTED Final   Klebsiella aerogenes NOT DETECTED NOT DETECTED Final   Klebsiella oxytoca NOT DETECTED NOT DETECTED Final   Klebsiella pneumoniae NOT DETECTED NOT DETECTED Final   Proteus species DETECTED (A) NOT DETECTED Final    Comment: CRITICAL RESULT CALLED TO, READ BACK BY AND VERIFIED WITH: ABBY ELLINGTON AT 3016 ON 07/16/2020 [facility].    Salmonella species NOT DETECTED NOT DETECTED Final   Serratia marcescens NOT DETECTED NOT DETECTED Final   Haemophilus influenzae NOT DETECTED NOT DETECTED Final   Neisseria meningitidis NOT DETECTED NOT DETECTED Final   Pseudomonas aeruginosa NOT DETECTED NOT DETECTED Final   Stenotrophomonas maltophilia NOT DETECTED NOT DETECTED Final   Candida albicans NOT DETECTED NOT DETECTED Final   Candida auris NOT DETECTED NOT DETECTED Final   Candida glabrata NOT DETECTED NOT DETECTED Final   Candida krusei NOT DETECTED NOT DETECTED Final   Candida parapsilosis NOT DETECTED NOT DETECTED Final   Candida tropicalis NOT DETECTED NOT DETECTED Final   Cryptococcus neoformans/gattii NOT DETECTED NOT DETECTED Final   CTX-M ESBL NOT DETECTED NOT DETECTED Final   Carbapenem resistance IMP NOT DETECTED NOT DETECTED Final   Carbapenem resistance KPC NOT DETECTED NOT DETECTED Final   Carbapenem resistance NDM NOT DETECTED NOT DETECTED Final   Carbapenem resist OXA 48 LIKE NOT DETECTED NOT DETECTED Final   Carbapenem resistance VIM NOT DETECTED NOT DETECTED Final    Comment: Performed at [facility], [address] [place], [place] [address]  Aerobic/Anaerobic Culture (surgical/deep wound)     Status: None (Preliminary result)   Collection Time: 07/15/20  4:18 PM   Specimen: PATH Other; Tissue  Result Value Ref Range Status   Specimen  Description   Final    PELVIS LEFT RENAL PELVIS Performed at [facility], [address] [place], [place] [address]    Special Requests   Final    NONE Performed at [facility], [address] [place], [place] [address]    Gram Stain   Final    ABUNDANT WBC PRESENT, PREDOMINANTLY PMN ABUNDANT GRAM NEGATIVE RODS    Culture   Final  CULTURE REINCUBATED FOR BETTER GROWTH HOLDING FOR POSSIBLE ANAEROBE Performed at [facility], [address] [place], [place] [address]    Report Status PENDING  Incomplete     Studies: No results found.  Scheduled Meds: . vitamin C  1,000 mg Oral Daily  . Chlorhexidine Gluconate Cloth  6 each Topical Daily  . cholecalciferol  1,000 Units Oral Daily  . famotidine  20 mg Oral Daily  . magnesium oxide  400 mg Oral Daily  . nortriptyline  10 mg Oral QHS  . predniSONE  15 mg Oral Q breakfast  . zinc sulfate  220 mg Oral Daily   Continuous Infusions: . cefTRIAXone (ROCEPHIN)  IV 2 g (07/17/20 0927)    Assessment/Plan:  1. Severe sepsis, present on admission secondary to obstructing kidney stone and acute pyelonephritis, acute kidney injury.  Blood cultures positive for Proteus.  Continue Rocephin.  Appreciate infectious disease consultation.  White blood cell count slowly trending better.  Platelet count trending higher but still low. 2. Obstructing kidney stone.  Patient had urgent stent by Dr. Lonna Cobb on 07/15/2020. 3. Syncope after micturition.  Blood pressure little higher this morning but patient had a syncopal episode after urination so I will hold Coreg this afternoon.  Check orthostatic vital signs. 4. Essential hypertension.  Blood pressure little higher this morning but had a syncopal episode so we will continue to hold blood pressure medications currently. 5. Interstitial lung disease on chronic prednisone 6. Chronic systolic congestive heart failure.  Discontinue fluids this morning. 7. Acute kidney  injury secondary to obstructing kidney stone. 8. Thrombocytopenia secondary to sepsis.  Platelet count still low but trending better off that 94 today. 9. Elevated troponin secondary to demand ischemia      Code Status:     Code Status Orders  (From admission, onward)         Start     Ordered   07/15/20 1653  Full code  Continuous        07/15/20 1652        Code Status History    Date Active Date Inactive Code Status Order ID Comments User Context   04/02/2020 1017 04/05/2020 1819 Full Code [id number]  Clydie Braun, MD ED   02/26/2016 1832 02/27/2016 1654 Full Code [id number]  Sheilah Pigeon, PA-C Inpatient   02/26/2016 1832 02/26/2016 1832 Full Code [id number]  Regan Lemming, MD Inpatient   Advance Care Planning Activity     Family Communication: Husband at bedside Disposition Plan: Status is: Inpatient  Dispo: The patient is from: Home              Anticipated d/c is to: Home              Anticipated d/c date is: Likely another couple days in the hospital              Patient currently being treated with IV antibiotics for sepsis.  Consultants:  Infectious disease  Urology  Antibiotics:  Rocephin  Time spent: 28 minutes  Abayomi [employer]

## 2020-07-18 ENCOUNTER — Telehealth: Payer: Self-pay | Admitting: *Deleted

## 2020-07-18 DIAGNOSIS — N10 Acute tubulo-interstitial nephritis: Secondary | ICD-10-CM | POA: Diagnosis not present

## 2020-07-18 LAB — GLUCOSE, CAPILLARY
Glucose-Capillary: 105 mg/dL — ABNORMAL HIGH (ref 70–99)
Glucose-Capillary: 153 mg/dL — ABNORMAL HIGH (ref 70–99)
Glucose-Capillary: 117 mg/dL — ABNORMAL HIGH (ref 70–99)

## 2020-07-18 LAB — BASIC METABOLIC PANEL
Anion gap: 9 (ref 5–15)
BUN: 19 mg/dL (ref 6–20)
CO2: 26 mmol/L (ref 22–32)
Calcium: 8.7 mg/dL — ABNORMAL LOW (ref 8.9–10.3)
Chloride: 102 mmol/L (ref 98–111)
Creatinine, Ser: 0.51 mg/dL (ref 0.44–1.00)
GFR, Estimated: >60 mL/min (ref >60)
Glucose, Bld: 100 mg/dL — ABNORMAL HIGH (ref 70–99)
Potassium: 3.1 mmol/L — ABNORMAL LOW (ref 3.5–5.1)
Sodium: 137 mmol/L (ref 135–145)

## 2020-07-18 LAB — CULTURE, BLOOD (SINGLE): Special Requests: ADEQUATE

## 2020-07-18 LAB — CBC
HCT: 35.4 % — ABNORMAL LOW (ref 36.0–46.0)
Hemoglobin: 12.5 g/dL (ref 12.0–15.0)
MCH: 31.9 pg (ref 26.0–34.0)
MCHC: 35.3 g/dL (ref 30.0–36.0)
MCV: 90.3 fL (ref 80.0–100.0)
Platelets: 123 10*3/uL — ABNORMAL LOW (ref 150–400)
RBC: 3.92 MIL/uL (ref 3.87–5.11)
RDW: 13.1 % (ref 11.5–15.5)
WBC: 12.7 10*3/uL — ABNORMAL HIGH (ref 4.0–10.5)
nRBC: 0 % (ref 0.0–0.2)

## 2020-07-18 MED ORDER — POTASSIUM CHLORIDE CRYS ER 20 MEQ PO TBCR
40.0000 meq | EXTENDED_RELEASE_TABLET | Freq: Once | ORAL | Status: AC
  Administered 2020-07-18: 40 meq via ORAL
  Filled 2020-07-18: qty 2

## 2020-07-18 MED ORDER — CARVEDILOL 6.25 MG PO TABS
6.2500 mg | ORAL_TABLET | Freq: Two times a day (BID) | ORAL | Status: DC
  Administered 2020-07-18 – 2020-07-19 (×3): 6.25 mg via ORAL
  Filled 2020-07-18 (×4): qty 1

## 2020-07-18 NOTE — Progress Notes (Signed)
ID Doing well today But did not sleep last night - beeping machine  Patient Vitals for the past 24 hrs:  BP Temp Temp src Pulse Resp SpO2 Weight  07/18/20 1217 134/85 97.9 F (36.6 C) Oral 83 19 96 % --  07/18/20 0800 (!) 134/91 98.1 F (36.7 C) Oral 90 (!) 24 94 % --  07/18/20 0749 -- -- -- 97 -- 98 % --  07/18/20 0510 134/87 98 F (36.7 C) Oral 94 -- 95 % 95.6 kg  07/17/20 2321 128/81 -- -- 91 18 95 % --  07/17/20 2043 125/79 98.2 F (36.8 C) Oral 81 18 95 % --  07/17/20 1536 -- -- -- -- -- 98 % --  07/17/20 1524 113/77 98.8 F (37.1 C) Oral 84 14 99 % --  o/e awake and alert Chest b/l air entry [id number] Some discomfort left flank CNS non focal  labs  CBC Latest Ref Rng & Units 07/18/2020 07/17/2020 07/16/2020  WBC 4.0 - 10.5 K/uL 12.7(H) 13.7(H) 14.3(H)  Hemoglobin 12.0 - 15.0 g/dL 88.4 16.6 11.9(L)  Hematocrit 36 - 46 % 35.4(L) 36.6 33.3(L)  Platelets 150 - 400 K/uL 123(L) 94(L) 66(L)    CMP Latest Ref Rng & Units 07/18/2020 07/17/2020 07/16/2020  Glucose 70 - 99 mg/dL 063(K) 160(F) 093(A)  BUN 6 - 20 mg/dL 19 35(T) 73(U)  Creatinine 0.44 - 1.00 mg/dL 2.02 5.42 7.06  Sodium 135 - 145 mmol/L 137 138 135  Potassium 3.5 - 5.1 mmol/L 3.1(L) 3.6 3.7  Chloride 98 - 111 mmol/L 102 102 102  CO2 22 - 32 mmol/L 26 27 23   Calcium 8.9 - 10.3 mg/dL ) 2.3(J) 6.2(G)  Total Protein 6.5 - 8.1 g/dL - - -  Total Bilirubin 0.3 - 1.2 mg/dL - - -  Alkaline Phos 38 - 126 U/L - - -  AST 15 - 41 U/L - - -  ALT 0 - 44 U/L - - -   Micro proteus bacteremia Urine culture gram neg rod No anerobes   Impression/recommendation  Proteus bacteremia and proteus UTIdue to obstructed left kidney from left PUJ stone S/p left ureteral stent placement Currently on ceftriaxone- pan sensitive- will give levaquin on discharge( complicated UTI) until 11/7 Would recommend urology manage the stone before the antibiotic is finished Will repeat blood culture today to make sure of  resolution Currently no concern for cardiac device infection DC   AKIdue to the obstruction, infection -improved  Interstitial lung disease on prednisone  Complete Heart block - PPM placed in June 2017- no etiology found With ILD and CHB sarcoid may have to be considered and worked up as OP  Discussed the management with the patient and hospitalist ID will sign off- call if needed

## 2020-07-18 NOTE — Progress Notes (Signed)
Mobility Specialist - Progress Note   07/18/20 1233  Mobility  Activity Refused mobility  Mobility performed by Mobility specialist    Pt politely declined mobility session at this time. Pt states she's "feeling ambushed" and "was fixing to take a nap". Will re-attempt at a later date/time.    Anne Rush Mobility Specialist  07/18/20, 12:34 PM

## 2020-07-18 NOTE — Telephone Encounter (Signed)
° °  [facility] Pre-operative Risk Assessment    HEARTCARE STAFF: - Please ensure there is not already an duplicate clearance open for this procedure. - Under Visit Info/Reason for Call, type in Other and utilize the format Clearance MM/DD/YY or Clearance TBD. Do not use dashes or single digits. - If request is for dental extraction, please clarify the # of teeth to be extracted.  Request for surgical clearance: URGENT  1. What type of surgery is being performed? LEFT URETEROSCOPY, LASER LITHOTRIPSY, URETERAL STENT   2. When is this surgery scheduled? 07/17/20  3. What type of clearance is required (medical clearance vs. Pharmacy clearance to hold med vs. Both)? MEDICAL AND DEVICE CLEARANCE   4. Are there any medications that need to be held prior to surgery and how long? NONE LISTED  5. Practice name and name of physician performing surgery? [facility]; DR. Hollice Espy   6. What is the office phone number? [phone]   7.   What is the office fax number? [phone]  8.   Anesthesia type (None, local, MAC, general) ? NOT LISTED   Julaine Hua 07/18/2020, 5:47 PM  _________________________________________________________________   (provider comments below)

## 2020-07-18 NOTE — Progress Notes (Signed)
Urology Consult Follow Up  Subjective: POD # 3 cystoscopy and left ureteral stent placement  Patient is much better this morning and in a brighter mood.  Some nausea, but she attributes this to lack of sleep due to environment.  VSS afebrile.  Foley is out and she is voiding well.  Her serum creatinine continues to trend downward and today is 0.51 from 0.67 yesterday.  Her WBC is down to 12.7 from 13.7 yesterday and her platelets have increased to 123 from 94 yesterday.    Blood cultures positive for Proteus and urine culture from procedure is still pending but with abundant gram-negative rods.  Anti-infectives: Anti-infectives (From admission, onward)   Start     Dose/Rate Route Frequency Ordered Stop   07/17/20 1800  metroNIDAZOLE (FLAGYL) IVPB 500 mg        500 mg 100 mL/hr over 60 Minutes Intravenous Every 8 hours 07/17/20 1607     07/16/20 1000  cefTRIAXone (ROCEPHIN) 2 g in sodium chloride 0.9 % 100 mL IVPB        2 g 200 mL/hr over 30 Minutes Intravenous Every 24 hours 07/16/20 0740 07/30/20 0959   07/15/20 1245  cefTRIAXone (ROCEPHIN) 1 g in sodium chloride 0.9 % 100 mL IVPB  Status:  Discontinued        1 g 200 mL/hr over 30 Minutes Intravenous Every 24 hours 07/15/20 1244 07/16/20 0740      Current Facility-Administered Medications  Medication Dose Route Frequency Provider Last Rate Last Admin  . 0.9 %  sodium chloride infusion   Intravenous PRN Alford Highland, MD   Stopped at 07/17/20 2151  . acetaminophen (TYLENOL) tablet 650 mg  650 mg Oral Q6H PRN Wieting, Richard, MD      . albuterol (VENTOLIN HFA) 108 (90 Base) MCG/ACT inhaler 2 puff  2 puff Inhalation Q4H PRN Lorretta Harp, MD      . ascorbic acid (VITAMIN C) tablet 1,000 mg  1,000 mg Oral Daily Lorretta Harp, MD   1,000 mg at 07/16/20 0909  . cefTRIAXone (ROCEPHIN) 2 g in sodium chloride 0.9 % 100 mL IVPB  2 g Intravenous Q24H Ronnald Ramp, RPH 200 mL/hr at 07/17/20 0927 2 g at 07/17/20 0927  . Chlorhexidine  Gluconate Cloth 2 % PADS 6 each  6 each Topical Daily Alford Highland, MD   6 each at 07/16/20 1258  . cholecalciferol (VITAMIN D3) tablet 1,000 Units  1,000 Units Oral Daily Lorretta Harp, MD   1,000 Units at 07/16/20 0907  . dextromethorphan-guaiFENesin (MUCINEX DM) 30-600 MG per 12 hr tablet 1 tablet  1 tablet Oral BID PRN Lorretta Harp, MD      . diazepam (VALIUM) tablet 5 mg  5 mg Oral Q8H PRN Lorretta Harp, MD      . famotidine (PEPCID) tablet 20 mg  20 mg Oral Daily Lorretta Harp, MD   20 mg at 07/17/20 0925  . HYDROcodone-acetaminophen (NORCO/VICODIN) 5-325 MG per tablet 1 tablet  1 tablet Oral Q6H PRN Alford Highland, MD   1 tablet at 07/18/20 0754  . magnesium oxide (MAG-OX) tablet 400 mg  400 mg Oral Daily Lorretta Harp, MD   400 mg at 07/16/20 1610  . metroNIDAZOLE (FLAGYL) IVPB 500 mg  500 mg Intravenous Q8H Ravishankar, Rhodia Albright, MD 100 mL/hr at 07/18/20 0603 500 mg at 07/18/20 0603  . morphine 2 MG/ML injection 1 mg  1 mg Intravenous Q4H PRN Lorretta Harp, MD   1 mg at 07/16/20 1554  . nortriptyline (  PAMELOR) capsule 10 mg  10 mg Oral QHS Lorretta Harp, MD   10 mg at 07/15/20 2310  . ondansetron (ZOFRAN) injection 4 mg  4 mg Intravenous Q8H PRN Lorretta Harp, MD   4 mg at 07/18/20 0754  . potassium chloride SA (KLOR-CON) CR tablet 40 mEq  40 mEq Oral Once Alford Highland, MD      . predniSONE (DELTASONE) tablet 15 mg  15 mg Oral Q breakfast Lorretta Harp, MD   15 mg at 07/18/20 0754  . zinc sulfate capsule 220 mg  220 mg Oral Daily Tressie Ellis, RPH   220 mg at 07/16/20 3716     Objective: Vital signs in last 24 hours: Temp:  [98 F (36.7 C)-98.8 F (37.1 C)] 98.1 F (36.7 C) (10/29 0800) Pulse Rate:  [81-97] 90 (10/29 0800) Resp:  [14-24] 24 (10/29 0800) BP: (104-134)/(67-91) 134/91 (10/29 0800) SpO2:  [94 %-99 %] 94 % (10/29 0800) Weight:  [95.6 kg] 95.6 kg (10/29 0510)  Intake/Output from previous day: 10/28 0701 - 10/29 0700 In: 578.6 [P.O.:480; I.V.:0.1; IV Piggyback:98.5] Out: 400  [Urine:400] Intake/Output this shift: No intake/output data recorded.   Physical Exam Constitutional:  Well nourished. Alert and oriented, No acute distress. HEENT: [place] AT, moist mucus membranes.  Trachea midline, no masses. Cardiovascular: No clubbing, cyanosis, or edema. Respiratory: Normal respiratory effort, no increased work of breathing. GI: Abdomen is soft, mildly tender, non distended, no abdominal masses. GU: Mild left CVA tenderness.  No bladder fullness or masses.  Neurologic: Grossly intact, no focal deficits, moving all 4 extremities. Psychiatric: Normal mood and affect.   Lab Results:  Recent Labs    07/17/20 0441 07/18/20 0627  WBC 13.7* 12.7*  HGB 12.9 12.5  HCT 36.6 35.4*  PLT 94* 123*   BMET Recent Labs    07/17/20 0441 07/18/20 0627  NA 138 137  K 3.6 3.1*  CL 102 102  CO2 27 26  GLUCOSE 105* 100*  BUN 21* 19  CREATININE 0.67 0.51  CALCIUM 8.4* 8.7*   PT/INR Recent Labs    07/15/20 1002  LABPROT 14.8  INR 1.2   ABG No results for input(s): PHART, HCO3 in the last 72 hours.  Invalid input(s): PCO2, PO2  Studies/Results: No results found.   Assessment and Plan: 59 year old female with interstitial lung disease, pacemaker and CHF who underwent left ureteral stent placement on July 15, 2020 for decompression source control after a left obstructing stone in the setting of sepsis  Recommendations: -Currently on broad-spectrum antibiotics awaiting sensitivities for final antibiotic recommendation and will need 2 weeks of antibiotics for complicated UTI per IDs recommendations -Current plans are to proceed with outpatient ureteroscopy and laser lithotripsy after preoperative clearance from pulmonology and cardiology   LOS: 3 days    [facility] 07/18/2020

## 2020-07-18 NOTE — Progress Notes (Signed)
Patient ID: Anne Rush, female   DOB: 06-Apr-1961, 59 y.o.   MRN: [id number] [facility] PROGRESS NOTE  Deasia [place]oll TKW:[id number] DOB: 1960/10/28 DOA: 07/15/2020 PCP: [name], MD  HPI/Subjective: Patient did not sleep much last night at all.  She wanted to be disconnected from the telemetry monitoring and pulse ox.  Patient does feel little bit better still has a little bit of pain.  Objective: Vitals:   07/18/20 0800 07/18/20 1217  BP: (!) 134/91 134/85  Pulse: 90 83  Resp: (!) 24 19  Temp: 98.1 F (36.7 C) 97.9 F (36.6 C)  SpO2: 94% 96%    Intake/Output Summary (Last 24 hours) at 07/18/2020 1358 Last data filed at 07/18/2020 1000 Gross per 24 hour  Intake 578.59 ml  Output 400 ml  Net 178.59 ml   Filed Weights   07/16/20 0515 07/17/20 0446 07/18/20 0510  Weight: 97.5 kg 96.8 kg 95.6 kg    ROS: Review of Systems  Respiratory: Negative for shortness of breath.   Cardiovascular: Negative for chest pain.  Gastrointestinal: Positive for abdominal pain. Negative for nausea and vomiting.   Exam: Physical Exam HENT:     Head: Normocephalic.     Mouth/Throat:     Pharynx: No oropharyngeal exudate.  Eyes:     General: Lids are normal.     Conjunctiva/sclera: Conjunctivae normal.     Pupils: Pupils are equal, round, and reactive to light.  Cardiovascular:     Rate and Rhythm: Normal rate and regular rhythm.     Heart sounds: Normal heart sounds, S1 normal and S2 normal.  Pulmonary:     Breath sounds: No decreased breath sounds, wheezing, rhonchi or rales.  Abdominal:     Palpations: Abdomen is soft.     Tenderness: There is no abdominal tenderness.  Musculoskeletal:     Right lower leg: No swelling.     Left lower leg: No swelling.  Skin:    General: Skin is warm.     Findings: No lesion.  Neurological:     Mental Status: She is alert and oriented to person, place, and time.       Data Reviewed: Basic Metabolic Panel: Recent Labs  Lab  07/15/20 1718 07/15/20 2144 07/16/20 0521 07/17/20 0441 07/18/20 0627  NA 130* 132* 135 138 137  K 3.4* 3.4* 3.7 3.6 3.1*  CL 98 99 102 102 102  CO2 22 22 23 27 26   GLUCOSE 133* 162* 165* 105* 100*  BUN 28* 30* 27* 21* 19  CREATININE 1.20* 1.21* 0.79 0.67 0.51  CALCIUM 8.2* 8.3* 8.7* 8.4* 8.7*  MG 1.5*  --   --   --   --   PHOS 2.0*  --   --   --   --    Liver Function Tests: Recent Labs  Lab 07/13/20 1214 07/15/20 1002  AST 22 26  ALT 20 15  ALKPHOS 37* 51  BILITOT 1.5* 1.7*  PROT 7.3 6.8  ALBUMIN 4.3 3.4*   Recent Labs  Lab 07/13/20 1214  LIPASE 25   CBC: Recent Labs  Lab 07/13/20 1211 07/15/20 1002 07/16/20 0521 07/17/20 0441 07/18/20 0627  WBC 14.7* 16.0* 14.3* 13.7* 12.7*  NEUTROABS  --  14.2*  --  11.4*  --   HGB 14.2 12.7 11.9* 12.9 12.5  HCT 39.7 35.5* 33.3* 36.6 35.4*  MCV 90.6 89.2 90.7 91.0 90.3  PLT 209 92* 66* 94* 123*   BNP (last 3 results) Recent Labs  04/02/20 1857 06/21/20 1330  BNP 21.9 171.5*     CBG: Recent Labs  Lab 07/17/20 1147 07/17/20 1613 07/17/20 2049 07/18/20 0808 07/18/20 1216  GLUCAP 185* 160* 113* 105* 153*    Recent Results (from the past 240 hour(s))  Respiratory Panel by RT PCR (Flu A&B, Covid) - Nasopharyngeal Swab     Status: None   Collection Time: 07/15/20 10:02 AM   Specimen: Nasopharyngeal Swab  Result Value Ref Range Status   SARS Coronavirus 2 by RT PCR NEGATIVE NEGATIVE Final    Comment: (NOTE) SARS-CoV-2 target nucleic acids are NOT DETECTED.  The SARS-CoV-2 RNA is generally detectable in upper respiratoy specimens during the acute phase of infection. The lowest concentration of SARS-CoV-2 viral copies this assay can detect is 131 copies/mL. A negative result does not preclude SARS-Cov-2 infection and should not be used as the sole basis for treatment or other patient management decisions. A negative result may occur with  improper specimen collection/handling, submission of specimen  other than nasopharyngeal swab, presence of viral mutation(s) within the areas targeted by this assay, and inadequate number of viral copies (<131 copies/mL). A negative result must be combined with clinical observations, patient history, and epidemiological information. The expected result is Negative.  Fact Sheet for Patients:  https://www.moore.com/  Fact Sheet for Healthcare Providers:  https://www.young.biz/  This test is no t yet approved or cleared by the [place] FDA and  has been authorized for detection and/or diagnosis of SARS-CoV-2 by FDA under an Emergency Use Authorization (EUA). This EUA will remain  in effect (meaning this test can be used) for the duration of the COVID-19 declaration under Section 564(b)(1) of the Act, 21 U.S.C. section 360bbb-3(b)(1), unless the authorization is terminated or revoked sooner.     Influenza A by PCR NEGATIVE NEGATIVE Final   Influenza B by PCR NEGATIVE NEGATIVE Final    Comment: (NOTE) The Xpert Xpress SARS-CoV-2/FLU/RSV assay is intended as an aid in  the diagnosis of influenza from Nasopharyngeal swab specimens and  should not be used as a sole basis for treatment. Nasal washings and  aspirates are unacceptable for Xpert Xpress SARS-CoV-2/FLU/RSV  testing.  Fact Sheet for Patients: https://www.moore.com/  Fact Sheet for Healthcare Providers: https://www.young.biz/  This test is not yet approved or cleared by the [place] FDA and  has been authorized for detection and/or diagnosis of SARS-CoV-2 by  FDA under an Emergency Use Authorization (EUA). This EUA will remain  in effect (meaning this test can be used) for the duration of the  Covid-19 declaration under Section 564(b)(1) of the Act, 21  U.S.C. section 360bbb-3(b)(1), unless the authorization is  terminated or revoked. Performed at [facility], [address]  [place], [place] [address]   Culture, blood (single)     Status: Abnormal   Collection Time: 07/15/20 10:02 AM   Specimen: BLOOD  Result Value Ref Range Status   Specimen Description   Final    BLOOD RIGHT ANTECUBITAL Performed at [facility], [address], [place], [place] [address]    Special Requests   Final    BOTTLES DRAWN AEROBIC AND ANAEROBIC Blood Culture adequate volume Performed at [facility], [address] [place], [place] [address]    Culture  Setup Time   Final    GRAM NEGATIVE RODS IN BOTH AEROBIC AND ANAEROBIC BOTTLES CRITICAL RESULT CALLED TO, READ BACK BY AND VERIFIED WITH: ABBY ELLINGTON AT 0623 ON 07/16/2020 [facility]. Performed at [facility], [address], [place], [place] [address]    Culture PROTEUS MIRABILIS (A)  Final   Report Status 07/18/2020 FINAL  Final   Organism ID, Bacteria PROTEUS MIRABILIS  Final      Susceptibility   Proteus mirabilis - MIC*    AMPICILLIN <=2 SENSITIVE Sensitive     CEFAZOLIN <=4 SENSITIVE Sensitive     CEFEPIME <=0.12 SENSITIVE Sensitive     CEFTAZIDIME <=1 SENSITIVE Sensitive     CEFTRIAXONE <=0.25 SENSITIVE Sensitive     CIPROFLOXACIN <=0.25 SENSITIVE Sensitive     GENTAMICIN <=1 SENSITIVE Sensitive     IMIPENEM 1 SENSITIVE Sensitive     TRIMETH/SULFA <=20 SENSITIVE Sensitive     AMPICILLIN/SULBACTAM <=2 SENSITIVE Sensitive     PIP/TAZO <=4 SENSITIVE Sensitive     * PROTEUS MIRABILIS  Blood Culture ID Panel (Reflexed)     Status: Abnormal   Collection Time: 07/15/20 10:02 AM  Result Value Ref Range Status   Enterococcus faecalis NOT DETECTED NOT DETECTED Final   Enterococcus Faecium NOT DETECTED NOT DETECTED Final   Listeria monocytogenes NOT DETECTED NOT DETECTED Final   Staphylococcus species NOT DETECTED NOT DETECTED Final   Staphylococcus aureus (BCID) NOT DETECTED NOT DETECTED Final   Staphylococcus epidermidis NOT DETECTED NOT DETECTED Final   Staphylococcus lugdunensis NOT DETECTED  NOT DETECTED Final   Streptococcus species NOT DETECTED NOT DETECTED Final   Streptococcus agalactiae NOT DETECTED NOT DETECTED Final   Streptococcus pneumoniae NOT DETECTED NOT DETECTED Final   Streptococcus pyogenes NOT DETECTED NOT DETECTED Final   A.calcoaceticus-baumannii NOT DETECTED NOT DETECTED Final   Bacteroides fragilis NOT DETECTED NOT DETECTED Final   Enterobacterales DETECTED (A) NOT DETECTED Final    Comment: Enterobacterales represent a large order of gram negative bacteria, not a single organism. CRITICAL RESULT CALLED TO, READ BACK BY AND VERIFIED WITH: ABBY ELLINGTON AT 1914 ON 07/16/2020 [facility].    Enterobacter cloacae complex NOT DETECTED NOT DETECTED Final   Escherichia coli NOT DETECTED NOT DETECTED Final   Klebsiella aerogenes NOT DETECTED NOT DETECTED Final   Klebsiella oxytoca NOT DETECTED NOT DETECTED Final   Klebsiella pneumoniae NOT DETECTED NOT DETECTED Final   Proteus species DETECTED (A) NOT DETECTED Final    Comment: CRITICAL RESULT CALLED TO, READ BACK BY AND VERIFIED WITH: ABBY ELLINGTON AT 7829 ON 07/16/2020 [facility].    Salmonella species NOT DETECTED NOT DETECTED Final   Serratia marcescens NOT DETECTED NOT DETECTED Final   Haemophilus influenzae NOT DETECTED NOT DETECTED Final   Neisseria meningitidis NOT DETECTED NOT DETECTED Final   Pseudomonas aeruginosa NOT DETECTED NOT DETECTED Final   Stenotrophomonas maltophilia NOT DETECTED NOT DETECTED Final   Candida albicans NOT DETECTED NOT DETECTED Final   Candida auris NOT DETECTED NOT DETECTED Final   Candida glabrata NOT DETECTED NOT DETECTED Final   Candida krusei NOT DETECTED NOT DETECTED Final   Candida parapsilosis NOT DETECTED NOT DETECTED Final   Candida tropicalis NOT DETECTED NOT DETECTED Final   Cryptococcus neoformans/gattii NOT DETECTED NOT DETECTED Final   CTX-M ESBL NOT DETECTED NOT DETECTED Final   Carbapenem resistance IMP NOT DETECTED NOT DETECTED Final   Carbapenem resistance KPC NOT  DETECTED NOT DETECTED Final   Carbapenem resistance NDM NOT DETECTED NOT DETECTED Final   Carbapenem resist OXA 48 LIKE NOT DETECTED NOT DETECTED Final   Carbapenem resistance VIM NOT DETECTED NOT DETECTED Final    Comment: Performed at [facility], [address] [place], [place] [address]  Aerobic/Anaerobic  Culture (surgical/deep wound)     Status: None (Preliminary result)   Collection Time: 07/15/20  4:18 PM   Specimen: PATH Other; Tissue  Result Value Ref Range Status   Specimen Description   Final    PELVIS LEFT RENAL PELVIS Performed at [facility], [address] [place], [place] [address]    Special Requests   Final    NONE Performed at [facility], [address] [place], [place] [address]    Gram Stain   Final    ABUNDANT WBC PRESENT, PREDOMINANTLY PMN ABUNDANT GRAM NEGATIVE RODS Performed at [facility], [address] [place], [place] [address]    Culture   Final    CULTURE REINCUBATED FOR BETTER GROWTH NO ANAEROBES ISOLATED; CULTURE IN PROGRESS FOR 5 DAYS    Report Status PENDING  Incomplete     Scheduled Meds: . vitamin C  1,000 mg Oral Daily  . Chlorhexidine Gluconate Cloth  6 each Topical Daily  . cholecalciferol  1,000 Units Oral Daily  . famotidine  20 mg Oral Daily  . magnesium oxide  400 mg Oral Daily  . nortriptyline  10 mg Oral QHS  . predniSONE  15 mg Oral Q breakfast  . zinc sulfate  220 mg Oral Daily   Continuous Infusions: . sodium chloride Stopped (07/17/20 2151)  . cefTRIAXone (ROCEPHIN)  IV 2 g (07/18/20 1027)  . metronidazole 500 mg (07/18/20 0603)    Assessment/Plan:  1. Severe sepsis, present on admission secondary to obstructing kidney stone and acute pyelonephritis and acute kidney injury.  Blood cultures positive for Proteus.  Sensitivities are pansensitive.  Continue Rocephin here.  Case discussed with infectious disease doctor and will switch over to Cipro upon disposition  home. 2. Obstructing kidney stone.  Patient had an urgent stent placed by Dr. Lonna Cobb on 07/15/2020.  Will follow up as outpatient for stone and stent removal. 3. Syncope yesterday after micturition.  Patient doing better today without pain on urination and walking back and forth to the bathroom. 4. Essential hypertension restart Coreg today. 5. Interstitial lung disease on chronic prednisone 6. Chronic systolic congestive heart failure.  No signs of heart failure currently.  Restart Coreg.  Hopefully can restart Aldactone tomorrow. 7. Acute kidney injury secondary to obstructing kidney stone.  Creatinine improved from 1.73 down to 0.51. 8. Thrombocytopenia secondary to sepsis 9. Elevated troponin secondary to demand ischemia    Code Status:     Code Status Orders  (From admission, onward)         Start     Ordered   07/15/20 1653  Full code  Continuous        07/15/20 1652        Code Status History    Date Active Date Inactive Code Status Order ID Comments User Context   04/02/2020 1017 04/05/2020 1819 Full Code [id number]  Clydie Braun, MD ED   02/26/2016 1832 02/27/2016 1654 Full Code [id number]  Sheilah Pigeon, PA-C Inpatient   02/26/2016 1832 02/26/2016 1832 Full Code [id number]  Regan Lemming, MD Inpatient   Advance Care Planning Activity     Family Communication: Husband at bedside Disposition Plan: Status is: Inpatient  Dispo: The patient is from: Home              Anticipated d/c is to: Home              Anticipated d/c date is: Potentially 07/19/2020  Patient currently being treated for severe sepsis with IV antibiotics.  Consultants:  Urology  Infectious disease  Procedures: urologic stent placement  Antibiotics:  Rocephin  Time spent: 27 minutes  [employer]

## 2020-07-18 NOTE — Progress Notes (Signed)
Physical Therapy Treatment Patient Details Name: Anne Rush MRN: [id number] DOB: 1961-09-16 Today's Date: 07/18/2020    History of Present Illness Anne Rush is a 59 y.o. female with a history of interstitial lung disease, complete heart block needing pacemaker , HTN, hyperlipidemia presented on 07/13/20 with severe acute  left flank pain , chills and vomiting.    PT Comments    Excellent progress from initial eval. Yesterday's episode on toilet seems to be isolated per staff. Pt has been asymptomatic since, IV d/c'd, no c/o pain, VSS. Pt received in room, husband present. Discussed POC and pt goals. Pt currently moving around in room independently with good safety awareness. Focused session on stair training. Pt completed gait training on level surface with multiple changes in direction, no LOB without AD and supervision. Pt negotiated 13 steps with single rail, step over step, with SBA for safety. No c/o pain, slight fatigue however non-limiting. Pt with possible d/c home tomorrow. Con't PT per POC.  Follow Up Recommendations  Home health PT     Equipment Recommendations  None recommended by PT    Recommendations for Other Services       Precautions / Restrictions Precautions Precautions: None Restrictions Weight Bearing Restrictions: No    Mobility  Bed Mobility Overal bed mobility: Modified Independent             General bed mobility comments: No increased time needed to complete tasks  Transfers Overall transfer level: Modified independent Equipment used: None Transfers: Sit to/from Stand Sit to Stand: Modified independent (Device/Increase time)         General transfer comment: Pt independent with mobility in room  Ambulation/Gait Ambulation/Gait assistance: Supervision Gait Distance (Feet): 150 Feet Assistive device: None Gait Pattern/deviations: Step-through pattern;Decreased stride length     General Gait Details:  (verbal cues for  pacing)   Stairs Stairs: Yes Stairs assistance: Supervision Stair Management: One rail Left;Alternating pattern Number of Stairs: 13 General stair comments: Good safety awareness on stairs   Wheelchair Mobility    Modified Rankin (Stroke Patients Only)       Balance Overall balance assessment: Independent                                          Cognition Arousal/Alertness: Awake/alert Behavior During Therapy: WFL for tasks assessed/performed Overall Cognitive Status: Within Functional Limits for tasks assessed                                        Exercises      General Comments General comments (skin integrity, edema, etc.): Pt is progressing towards baseline LOF      Pertinent Vitals/Pain Pain Assessment: No/denies pain    Home Living                      Prior Function            PT Goals (current goals can now be found in the care plan section) Acute Rehab PT Goals Patient Stated Goal:  (to get home) Progress towards PT goals: Progressing toward goals    Frequency    Min 2X/week      PT Plan Current plan remains appropriate    Co-evaluation  AM-PAC PT "6 Clicks" Mobility   Outcome Measure  Help needed turning from your back to your side while in a flat bed without using bedrails?: None Help needed moving from lying on your back to sitting on the side of a flat bed without using bedrails?: None Help needed moving to and from a bed to a chair (including a wheelchair)?: None Help needed standing up from a chair using your arms (e.g., wheelchair or bedside chair)?: None Help needed to walk in hospital room?: None Help needed climbing 3-5 steps with a railing? : A Little 6 Click Score: 23    End of Session Equipment Utilized During Treatment: Gait belt Activity Tolerance: Patient tolerated treatment well Patient left: with call bell/phone within reach;with family/visitor  present Nurse Communication: Mobility status PT Visit Diagnosis: Unsteadiness on feet (R26.81);Other abnormalities of gait and mobility (R26.89);Muscle weakness (generalized) (M62.81);Difficulty in walking, not elsewhere classified (R26.2)     Time: 7425-9563 PT Time Calculation (min) (ACUTE ONLY): 33 min  Charges:                        Zadie Cleverly, PTA   Jannet Askew 07/18/2020, 11:46 AM

## 2020-07-18 NOTE — Progress Notes (Signed)
Mobility Specialist - Progress Note   07/18/20 1524  Mobility  Activity Ambulated in room;Ambulated in hall  Level of Assistance Independent (CGA for safety)  Assistive Device None  Distance Ambulated (ft) 260 ft  Mobility Response Tolerated well  Mobility performed by Mobility specialist  $Mobility charge 1 Mobility    Re-attempted session at this time. Pt laying in bed upon arrival. Pt agreed to session. Pt independent w/ bed mobility. Pt requested to go to the bathroom before ambulating hallway. Pt ambulated to bathroom independently. Pt performed bathroom hygiene independently. Pt ambulated 260' total in room and hallway independently w/o AD. CGA utilized for safety. No LOB noted. No c/o pain or SOB. O2 sat > 90% t/o session. Pt on RA. Overall, pt tolerated session very well. Pt pleasant and motivated. Pt left sitting EOB w/ all needs placed in reach. Nurse was notified.    Quavis Klutz Mobility Specialist  07/18/20, 3:26 PM

## 2020-07-19 LAB — CBC
HCT: 36.1 % (ref 36.0–46.0)
Hemoglobin: 12.7 g/dL (ref 12.0–15.0)
MCH: 32.3 pg (ref 26.0–34.0)
MCHC: 35.2 g/dL (ref 30.0–36.0)
MCV: 91.9 fL (ref 80.0–100.0)
Platelets: 166 10*3/uL (ref 150–400)
RBC: 3.93 MIL/uL (ref 3.87–5.11)
RDW: 13.2 % (ref 11.5–15.5)
WBC: 16.5 10*3/uL — ABNORMAL HIGH (ref 4.0–10.5)
nRBC: 0 % (ref 0.0–0.2)

## 2020-07-19 LAB — BASIC METABOLIC PANEL
Anion gap: 10 (ref 5–15)
BUN: 16 mg/dL (ref 6–20)
CO2: 26 mmol/L (ref 22–32)
Calcium: 8.9 mg/dL (ref 8.9–10.3)
Chloride: 102 mmol/L (ref 98–111)
Creatinine, Ser: 0.52 mg/dL (ref 0.44–1.00)
GFR, Estimated: >60 mL/min (ref >60)
Glucose, Bld: 95 mg/dL (ref 70–99)
Potassium: 3.4 mmol/L — ABNORMAL LOW (ref 3.5–5.1)
Sodium: 138 mmol/L (ref 135–145)

## 2020-07-19 LAB — ANGIOTENSIN CONVERTING ENZYME: Angiotensin-Converting Enzyme: 28 U/L (ref 14–82)

## 2020-07-19 MED ORDER — TAMSULOSIN HCL 0.4 MG PO CAPS
0.4000 mg | ORAL_CAPSULE | Freq: Every day | ORAL | Status: DC
  Administered 2020-07-19 – 2020-07-20 (×2): 0.4 mg via ORAL
  Filled 2020-07-19 (×2): qty 1

## 2020-07-19 MED ORDER — PREDNISONE 10 MG PO TABS
5.0000 mg | ORAL_TABLET | Freq: Every day | ORAL | Status: DC
  Administered 2020-07-20: 5 mg via ORAL
  Filled 2020-07-19: qty 1

## 2020-07-19 MED ORDER — SPIRONOLACTONE 25 MG PO TABS
12.5000 mg | ORAL_TABLET | Freq: Every day | ORAL | Status: DC
  Filled 2020-07-19: qty 0.5
  Filled 2020-07-19: qty 1

## 2020-07-19 NOTE — Progress Notes (Signed)
Patient ID: Anne Rush, female   DOB: 04-22-1961, 59 y.o.   MRN: [id number] [facility] PROGRESS NOTE  Haadiya Frogge Callegari PPI:[id number] DOB: 02/17/1961 DOA: 07/15/2020 PCP: [name], MD  HPI/Subjective: Patient had some nausea this morning.  Also had a lot of pain at 3:30 in the morning.  Also had some pain after I saw her this morning.  Patient nervous about going home with pain control.  Came in with sepsis and obstructing kidney stone.  Objective: Vitals:   07/19/20 0801 07/19/20 1203  BP: 132/75 129/69  Pulse: 99 75  Resp: 17 17  Temp: (!) 97.5 F (36.4 C) 98 F (36.7 C)  SpO2: 96% 95%    Intake/Output Summary (Last 24 hours) at 07/19/2020 1234 Last data filed at 07/19/2020 0801 Gross per 24 hour  Intake 580 ml  Output 500 ml  Net 80 ml   Filed Weights   07/17/20 0446 07/18/20 0510 07/19/20 0359  Weight: 96.8 kg 95.6 kg 95.3 kg    ROS: Review of Systems  Respiratory: Negative for shortness of breath.   Cardiovascular: Negative for chest pain.  Gastrointestinal: Positive for abdominal pain and nausea. Negative for vomiting.   Exam: Physical Exam HENT:     Head: Normocephalic.     Mouth/Throat:     Pharynx: No oropharyngeal exudate.  Eyes:     General: Lids are normal.     Conjunctiva/sclera: Conjunctivae normal.     Pupils: Pupils are equal, round, and reactive to light.  Cardiovascular:     Rate and Rhythm: Normal rate and regular rhythm.     Heart sounds: Normal heart sounds, S1 normal and S2 normal.  Pulmonary:     Breath sounds: No decreased breath sounds, wheezing or rhonchi.  Abdominal:     Palpations: Abdomen is soft.     Tenderness: There is abdominal tenderness in the left lower quadrant.  Musculoskeletal:     Right ankle: No swelling.     Left ankle: No swelling.  Skin:    General: Skin is warm.     Findings: No rash.  Neurological:     Mental Status: She is alert and oriented to person, place, and time.       Data  Reviewed: Basic Metabolic Panel: Recent Labs  Lab 07/15/20 1718 07/15/20 1718 07/15/20 2144 07/16/20 0521 07/17/20 0441 07/18/20 0627 07/19/20 0624  NA 130*   < > 132* 135 138 137 138  K 3.4*   < > 3.4* 3.7 3.6 3.1* 3.4*  CL 98   < > 99 102 102 102 102  CO2 22   < > 22 23 27 26 26   GLUCOSE 133*   < > 162* 165* 105* 100* 95  BUN 28*   < > 30* 27* 21* 19 16  CREATININE 1.20*   < > 1.21* 0.79 0.67 0.51 0.52  CALCIUM 8.2*   < > 8.3* 8.7* 8.4* 8.7* 8.9  MG 1.5*  --   --   --   --   --   --   PHOS 2.0*  --   --   --   --   --   --    < > = values in this interval not displayed.   Liver Function Tests: Recent Labs  Lab 07/13/20 1214 07/15/20 1002  AST 22 26  ALT 20 15  ALKPHOS 37* 51  BILITOT 1.5* 1.7*  PROT 7.3 6.8  ALBUMIN 4.3 3.4*   Recent Labs  Lab 07/13/20  1214  LIPASE 25   CBC: Recent Labs  Lab 07/15/20 1002 07/16/20 0521 07/17/20 0441 07/18/20 0627 07/19/20 0624  WBC 16.0* 14.3* 13.7* 12.7* 16.5*  NEUTROABS 14.2*  --  11.4*  --   --   HGB 12.7 11.9* 12.9 12.5 12.7  HCT 35.5* 33.3* 36.6 35.4* 36.1  MCV 89.2 90.7 91.0 90.3 91.9  PLT 92* 66* 94* 123* 166   BNP (last 3 results) Recent Labs    04/02/20 1857 06/21/20 1330  BNP 21.9 171.5*     CBG: Recent Labs  Lab 07/17/20 1613 07/17/20 2049 07/18/20 0808 07/18/20 1216 07/18/20 1606  GLUCAP 160* 113* 105* 153* 117*    Recent Results (from the past 240 hour(s))  Respiratory Panel by RT PCR (Flu A&B, Covid) - Nasopharyngeal Swab     Status: None   Collection Time: 07/15/20 10:02 AM   Specimen: Nasopharyngeal Swab  Result Value Ref Range Status   SARS Coronavirus 2 by RT PCR NEGATIVE NEGATIVE Final    Comment: (NOTE) SARS-CoV-2 target nucleic acids are NOT DETECTED.  The SARS-CoV-2 RNA is generally detectable in upper respiratoy specimens during the acute phase of infection. The lowest concentration of SARS-CoV-2 viral copies this assay can detect is 131 copies/mL. A negative result does  not preclude SARS-Cov-2 infection and should not be used as the sole basis for treatment or other patient management decisions. A negative result may occur with  improper specimen collection/handling, submission of specimen other than nasopharyngeal swab, presence of viral mutation(s) within the areas targeted by this assay, and inadequate number of viral copies (<131 copies/mL). A negative result must be combined with clinical observations, patient history, and epidemiological information. The expected result is Negative.  Fact Sheet for Patients:  https://www.moore.com/  Fact Sheet for Healthcare Providers:  https://www.young.biz/  This test is no t yet approved or cleared by the [place] FDA and  has been authorized for detection and/or diagnosis of SARS-CoV-2 by FDA under an Emergency Use Authorization (EUA). This EUA will remain  in effect (meaning this test can be used) for the duration of the COVID-19 declaration under Section 564(b)(1) of the Act, 21 U.S.C. section 360bbb-3(b)(1), unless the authorization is terminated or revoked sooner.     Influenza A by PCR NEGATIVE NEGATIVE Final   Influenza B by PCR NEGATIVE NEGATIVE Final    Comment: (NOTE) The Xpert Xpress SARS-CoV-2/FLU/RSV assay is intended as an aid in  the diagnosis of influenza from Nasopharyngeal swab specimens and  should not be used as a sole basis for treatment. Nasal washings and  aspirates are unacceptable for Xpert Xpress SARS-CoV-2/FLU/RSV  testing.  Fact Sheet for Patients: https://www.moore.com/  Fact Sheet for Healthcare Providers: https://www.young.biz/  This test is not yet approved or cleared by the [place] FDA and  has been authorized for detection and/or diagnosis of SARS-CoV-2 by  FDA under an Emergency Use Authorization (EUA). This EUA will remain  in effect (meaning this test can be used) for the  duration of the  Covid-19 declaration under Section 564(b)(1) of the Act, 21  U.S.C. section 360bbb-3(b)(1), unless the authorization is  terminated or revoked. Performed at [facility], [address] [place], [place] [address]   Culture, blood (single)     Status: Abnormal   Collection Time: 07/15/20 10:02 AM   Specimen: BLOOD  Result Value Ref Range Status   Specimen Description   Final    BLOOD RIGHT ANTECUBITAL Performed at [facility], [address] [place], [place] [address]    Special Requests   Final    BOTTLES DRAWN AEROBIC AND ANAEROBIC Blood Culture adequate volume Performed at [facility], [address] [place], [place] [address]    Culture  Setup Time   Final    GRAM NEGATIVE RODS IN BOTH AEROBIC AND ANAEROBIC BOTTLES CRITICAL RESULT CALLED TO, READ BACK BY AND VERIFIED WITH: ABBY ELLINGTON AT 0160 ON 07/16/2020 [facility]. Performed at [facility], [address], [place], [place] [address]    Culture PROTEUS MIRABILIS (A)  Final   Report Status 07/18/2020 FINAL  Final   Organism ID, Bacteria PROTEUS MIRABILIS  Final      Susceptibility   Proteus mirabilis - MIC*    AMPICILLIN <=2 SENSITIVE Sensitive     CEFAZOLIN <=4 SENSITIVE Sensitive     CEFEPIME <=0.12 SENSITIVE Sensitive     CEFTAZIDIME <=1 SENSITIVE Sensitive     CEFTRIAXONE <=0.25 SENSITIVE Sensitive     CIPROFLOXACIN <=0.25 SENSITIVE Sensitive     GENTAMICIN <=1 SENSITIVE Sensitive     IMIPENEM 1 SENSITIVE Sensitive     TRIMETH/SULFA <=20 SENSITIVE Sensitive     AMPICILLIN/SULBACTAM <=2 SENSITIVE Sensitive     PIP/TAZO <=4 SENSITIVE Sensitive     * PROTEUS MIRABILIS  Blood Culture ID Panel (Reflexed)     Status: Abnormal   Collection Time: 07/15/20 10:02 AM  Result Value Ref Range Status   Enterococcus faecalis NOT DETECTED NOT DETECTED Final   Enterococcus Faecium NOT DETECTED NOT DETECTED Final   Listeria monocytogenes NOT DETECTED NOT DETECTED Final    Staphylococcus species NOT DETECTED NOT DETECTED Final   Staphylococcus aureus (BCID) NOT DETECTED NOT DETECTED Final   Staphylococcus epidermidis NOT DETECTED NOT DETECTED Final   Staphylococcus lugdunensis NOT DETECTED NOT DETECTED Final   Streptococcus species NOT DETECTED NOT DETECTED Final   Streptococcus agalactiae NOT DETECTED NOT DETECTED Final   Streptococcus pneumoniae NOT DETECTED NOT DETECTED Final   Streptococcus pyogenes NOT DETECTED NOT DETECTED Final   A.calcoaceticus-baumannii NOT DETECTED NOT DETECTED Final   Bacteroides fragilis NOT DETECTED NOT DETECTED Final   Enterobacterales DETECTED (A) NOT DETECTED Final    Comment: Enterobacterales represent a large order of gram negative bacteria, not a single organism. CRITICAL RESULT CALLED TO, READ BACK BY AND VERIFIED WITH: ABBY ELLINGTON AT 3557 ON 07/16/2020 [facility].    Enterobacter cloacae complex NOT DETECTED NOT DETECTED Final   Escherichia coli NOT DETECTED NOT DETECTED Final   Klebsiella aerogenes NOT DETECTED NOT DETECTED Final   Klebsiella oxytoca NOT DETECTED NOT DETECTED Final   Klebsiella pneumoniae NOT DETECTED NOT DETECTED Final   Proteus species DETECTED (A) NOT DETECTED Final    Comment: CRITICAL RESULT CALLED TO, READ BACK BY AND VERIFIED WITH: ABBY ELLINGTON AT 3220 ON 07/16/2020 [facility].    Salmonella species NOT DETECTED NOT DETECTED Final   Serratia marcescens NOT DETECTED NOT DETECTED Final   Haemophilus influenzae NOT DETECTED NOT DETECTED Final   Neisseria meningitidis NOT DETECTED NOT DETECTED Final   Pseudomonas aeruginosa NOT DETECTED NOT DETECTED Final   Stenotrophomonas maltophilia NOT DETECTED NOT DETECTED Final   Candida albicans NOT DETECTED NOT DETECTED Final   Candida auris NOT DETECTED NOT DETECTED Final   Candida glabrata NOT DETECTED NOT DETECTED Final   Candida krusei NOT DETECTED NOT DETECTED Final   Candida parapsilosis NOT DETECTED NOT DETECTED Final   Candida tropicalis NOT DETECTED  NOT DETECTED Final   Cryptococcus neoformans/gattii NOT DETECTED NOT DETECTED  Final   CTX-M ESBL NOT DETECTED NOT DETECTED Final   Carbapenem resistance IMP NOT DETECTED NOT DETECTED Final   Carbapenem resistance KPC NOT DETECTED NOT DETECTED Final   Carbapenem resistance NDM NOT DETECTED NOT DETECTED Final   Carbapenem resist OXA 48 LIKE NOT DETECTED NOT DETECTED Final   Carbapenem resistance VIM NOT DETECTED NOT DETECTED Final    Comment: Performed at [facility], [address] [place], [place] [address]  Aerobic/Anaerobic Culture (surgical/deep wound)     Status: None (Preliminary result)   Collection Time: 07/15/20  4:18 PM   Specimen: PATH Other; Tissue  Result Value Ref Range Status   Specimen Description   Final    PELVIS LEFT RENAL PELVIS Performed at [facility], [address] [place], [place] [address]    Special Requests   Final    NONE Performed at [facility], [address], [place], [place] [address]    Gram Stain   Final    ABUNDANT WBC PRESENT, PREDOMINANTLY PMN ABUNDANT GRAM NEGATIVE RODS Performed at [facility], [address], [place], [place] [address]    Culture   Final    CULTURE REINCUBATED FOR BETTER GROWTH NO ANAEROBES ISOLATED; CULTURE IN PROGRESS FOR 5 DAYS    Report Status PENDING  Incomplete  Culture, blood (Routine X 2) w Reflex to ID Panel     Status: None (Preliminary result)   Collection Time: 07/18/20  3:33 PM   Specimen: BLOOD  Result Value Ref Range Status   Specimen Description BLOOD LEFT ANTECUBITAL  Final   Special Requests   Final    BOTTLES DRAWN AEROBIC AND ANAEROBIC Blood Culture adequate volume   Culture   Final    NO GROWTH < 24 HOURS Performed at [facility], [address], [place], [place] [address]    Report Status PENDING  Incomplete  Culture, blood (Routine X 2) w Reflex to ID Panel     Status: None (Preliminary result)   Collection Time: 07/18/20  3:39 PM    Specimen: BLOOD  Result Value Ref Range Status   Specimen Description BLOOD BLOOD LEFT HAND  Final   Special Requests   Final    BOTTLES DRAWN AEROBIC AND ANAEROBIC Blood Culture results may not be optimal due to an inadequate volume of blood received in culture bottles   Culture   Final    NO GROWTH < 24 HOURS Performed at [facility], [address] [place], [place] [address]    Report Status PENDING  Incomplete      Scheduled Meds: . vitamin C  1,000 mg Oral Daily  . carvedilol  6.25 mg Oral BID WC  . Chlorhexidine Gluconate Cloth  6 each Topical Daily  . cholecalciferol  1,000 Units Oral Daily  . famotidine  20 mg Oral Daily  . magnesium oxide  400 mg Oral Daily  . [START ON 07/20/2020] predniSONE  5 mg Oral Q breakfast  . tamsulosin  0.4 mg Oral Daily  . zinc sulfate  220 mg Oral Daily   Continuous Infusions: . sodium chloride Stopped (07/17/20 2151)  . cefTRIAXone (ROCEPHIN)  IV 2 g (07/19/20 0630)    Assessment/Plan:  1. Severe sepsis, present on admission secondary to obstructing kidney stone and acute pyelonephritis.  Patient also had acute kidney injury which has improved.  Blood cultures positive for Proteus.  Patient is on IV Rocephin here and the plan will be to switch over to Levaquin  upon going home.  Patient's white count is up to 16.5 today and patient had some worsening abdominal pain episodes today.  Will monitor another day in the hospital. 2. Obstructing kidney stone.  Patient had an urgent stent placement by Dr. Lonna Cobb on 07/15/2020.  Patient will follow up as outpatient for outpatient stone and stent removal.  Restart Flomax. 3. Essential hypertension.  Restarted Coreg.  Will start Aldactone tomorrow morning. 4. Interstitial lung disease.  Decrease down to her normal 5 mg of prednisone. 5. Chronic systolic congestive heart failure.  Currently no signs of heart failure.  On Coreg.  Restart Aldactone tomorrow. 6. Acute kidney injury secondary to  obstructing kidney stone.  Creatinine peaked at 1.73 and has improved to 0.52. 7. Thrombocytopenia secondary to sepsis.  Platelet count has recovered up to 166. 8. Patient states that she does not take nortriptyline this medication was discontinued.     Code Status:     Code Status Orders  (From admission, onward)         Start     Ordered   07/15/20 1653  Full code  Continuous        07/15/20 1652        Code Status History    Date Active Date Inactive Code Status Order ID Comments User Context   04/02/2020 1017 04/05/2020 1819 Full Code [id number]  Clydie Braun, MD ED   02/26/2016 1832 02/27/2016 1654 Full Code [id number]  Sheilah Pigeon, PA-C Inpatient   02/26/2016 1832 02/26/2016 1832 Full Code [id number]  Regan Lemming, MD Inpatient   Advance Care Planning Activity     Family Communication: Husband at bedside Disposition Plan: Status is: Inpatient  Dispo: The patient is from: Home              Anticipated d/c is to: Home              Anticipated d/c date is: Potentially 07/20/2020              Patient currently had more abdominal pain this morning and white count elevated.  We will continue another day of IV antibiotics and reassess things tomorrow.  Antibiotics:  Rocephin  Time spent: 27 minutes  Damica [employer]

## 2020-07-19 NOTE — Progress Notes (Signed)
Mobility Specialist - Progress Note   07/19/20 1648  Mobility  Activity Ambulated in hall  Level of Assistance Independent  Assistive Device None  Distance Ambulated (ft) 240 ft  Mobility Response Tolerated well  Mobility performed by Mobility specialist  $Mobility charge 1 Mobility    Pre-mobility: 93 HR, 96% SpO2 Post-mobility: 104 HR, 91% SpO2   Pt was lying in bed upon arrival on room air. Pt agreed to session. Pt c/o pain in abdomen, rating it a "9/10" but was not limited for mobility. Pt is independent in all transfers, including ambulation. Pt ambulated 240' in room/hallway without AD. No LOB noted. No heavy breathing noted. Pt did c/o feeling slightly SOB, however, pt stated "that is nothing abnormal for me". Overall, pt tolerated session well. Pt was left in bed with all needs in reach and is requesting pain medication at this time. Nurse was notified.    Filiberto Pinks Mobility Specialist 07/19/20, 4:53 PM

## 2020-07-20 LAB — CBC
HCT: 34.2 % — ABNORMAL LOW (ref 36.0–46.0)
Hemoglobin: 11.9 g/dL — ABNORMAL LOW (ref 12.0–15.0)
MCH: 32.2 pg (ref 26.0–34.0)
MCHC: 34.8 g/dL (ref 30.0–36.0)
MCV: 92.7 fL (ref 80.0–100.0)
Platelets: 219 10*3/uL (ref 150–400)
RBC: 3.69 MIL/uL — ABNORMAL LOW (ref 3.87–5.11)
RDW: 13.2 % (ref 11.5–15.5)
WBC: 13.4 10*3/uL — ABNORMAL HIGH (ref 4.0–10.5)
nRBC: 0 % (ref 0.0–0.2)

## 2020-07-20 MED ORDER — SPIRONOLACTONE 25 MG PO TABS
25.0000 mg | ORAL_TABLET | Freq: Every day | ORAL | Status: DC
  Administered 2020-07-20: 25 mg via ORAL

## 2020-07-20 MED ORDER — HYDROCODONE-ACETAMINOPHEN 5-325 MG PO TABS: 1.0000 | ORAL_TABLET | Freq: Four times a day (QID) | ORAL | 0 refills | Status: DC | PRN

## 2020-07-20 MED ORDER — ONDANSETRON HCL 4 MG PO TABS: 4.0000 mg | ORAL_TABLET | Freq: Three times a day (TID) | ORAL | 0 refills | Status: DC | PRN

## 2020-07-20 MED ORDER — TAMSULOSIN HCL 0.4 MG PO CAPS: 0.4000 mg | ORAL_CAPSULE | Freq: Every day | ORAL | 0 refills | Status: DC

## 2020-07-20 MED ORDER — LEVOFLOXACIN 500 MG PO TABS
500.0000 mg | ORAL_TABLET | Freq: Every day | ORAL | 0 refills | Status: AC
Start: 2020-07-21 — End: 2020-07-28

## 2020-07-20 MED ORDER — CARVEDILOL 12.5 MG PO TABS
12.5000 mg | ORAL_TABLET | Freq: Two times a day (BID) | ORAL | Status: DC
  Administered 2020-07-20: 12.5 mg via ORAL

## 2020-07-20 MED ORDER — CARVEDILOL 12.5 MG PO TABS
12.5000 mg | ORAL_TABLET | Freq: Two times a day (BID) | ORAL | 0 refills | Status: DC
Start: 2020-07-20 — End: 2020-08-06

## 2020-07-20 NOTE — Plan of Care (Signed)

## 2020-07-20 NOTE — Discharge Summary (Signed)
[facility] [place] [facility]   PATIENT NAME: Anne Rush    MR#:  [id number]  DATE OF BIRTH:  01/05/1961  DATE OF ADMISSION:  07/15/2020 ADMITTING PHYSICIAN: Lorretta Harp, MD  DATE OF DISCHARGE: 07/20/2020 10:03 AM  PRIMARY CARE PHYSICIAN: [name], MD    ADMISSION DIAGNOSIS:  Acute pyelonephritis [N10]  DISCHARGE DIAGNOSIS:  Principal Problem:   Acute pyelonephritis Active Problems:   Essential hypertension   ILD (interstitial lung disease) (HCC)   Kidney stone   GERD (gastroesophageal reflux disease)   Anxiety   Chronic systolic CHF (congestive heart failure) (HCC)   Acute metabolic encephalopathy   Proteus septicemia (HCC)   Hyponatremia   Hypokalemia   AKI (acute kidney injury) (HCC)   Thrombocytopenia (HCC)   Elevated troponin   Syncope   SECONDARY DIAGNOSIS:   Past Medical History:  Diagnosis Date  . Anxiety   . Family history of ovarian cancer   . Hyperlipidemia   . Hypertension   . Interstitial lung disease (HCC)   . Pollen allergies   . Squamous cell carcinoma 2014   skin cancer  . Subclinical hyperthyroidism 06/17/2014    HOSPITAL COURSE:   1.  Severe sepsis, present on admission secondary to obstructing kidney stone and acute pyelonephritis.  Blood cultures positive for Proteus.  Patient on IV Rocephin here in the hospital.  Patient seen in consultation by infectious disease and will switch over to Levaquin upon going home.  White count trending better today than yesterday.  Patient had a better night last night without any abdominal pain. 2.  Obstructing kidney stone.  Patient had an urgent stent placement by Dr. Lonna Cobb on 07/15/2020.  Patient will have an outpatient follow-up with urology on 8 November.  Flomax. 3.  Essential hypertension on Coreg and Aldactone. 4.  Interstitial lung disease.  Continue chronic prednisone 5 mg. 5.  Chronic systolic congestive heart failure.  No signs of heart failure on this hospital  stay actually received fluids for a few days.  Continue Coreg and Aldactone. 6.  Acute kidney injury secondary to obstructing kidney stone.  Creatinine peaked at 1.73 and has improved down to 0.52 7.  Thrombocytopenia secondary to sepsis.  Platelet count was as low as 66,000 during the hospitalization.  This has recovered to 219,000 upon discharge home. 8.  Hyponatremia.  Improved during the hospital course with IV fluid hydration.  DISCHARGE CONDITIONS:   Satisfactory  CONSULTS OBTAINED:  Treatment Team:  Vanna Scotland, MD Riki Altes, MD  DRUG ALLERGIES:   Allergies  Allergen Reactions  . Codeine Hives and Itching  . Losartan Potassium Other (See Comments)  . Neomycin-Polymyxin-Dexameth Itching, Swelling and Other (See Comments)    Swelling and itching of eye   . Oxycodone-Acetaminophen Other (See Comments)    REACTION: unspecified  . Paroxetine Other (See Comments)    unknown  . Sulfonamide Derivatives Other (See Comments)    REACTION: unspecified  . Lisinopril Rash  . Sulfa Antibiotics Hives and Rash         DISCHARGE MEDICATIONS:   Allergies as of 07/20/2020      Reactions   Codeine Hives, Itching   Losartan Potassium Other (See Comments)   Neomycin-polymyxin-dexameth Itching, Swelling, Other (See Comments)   Swelling and itching of eye    Oxycodone-acetaminophen Other (See Comments)   REACTION: unspecified   Paroxetine Other (See Comments)   unknown   Sulfonamide Derivatives Other (See Comments)   REACTION: unspecified   Lisinopril Rash  Sulfa Antibiotics Hives, Rash         Medication List    STOP taking these medications   benzonatate 200 MG capsule Commonly known as: TESSALON   hydrochlorothiazide 12.5 MG tablet Commonly known as: HYDRODIURIL   nortriptyline 10 MG capsule Commonly known as: PAMELOR   oxyCODONE-acetaminophen 5-325 MG tablet Commonly known as: PERCOCET/ROXICET   promethazine 25 MG tablet Commonly known as:  PHENERGAN     TAKE these medications   carvedilol 12.5 MG tablet Commonly known as: COREG Take 1 tablet (12.5 mg total) by mouth 2 (two) times daily with a meal. What changed:  medication strength how much to take   cholecalciferol 25 MCG (1000 UNIT) tablet Commonly known as: VITAMIN D3 Take 1,000 Units by mouth daily.   diazepam 5 MG tablet Commonly known as: VALIUM TAKE ONE TABLET BY MOUTH EVERY 8 HOURS AS NEEDED FOR ANXIETY What changed:  reasons to take this additional instructions   famotidine 20 MG tablet Commonly known as: PEPCID Take 1 tablet (20 mg total) by mouth daily.   HYDROcodone-acetaminophen 5-325 MG tablet Commonly known as: NORCO/VICODIN Take 1 tablet by mouth every 6 (six) hours as needed for moderate pain.   levofloxacin 500 MG tablet Commonly known as: Levaquin Take 1 tablet (500 mg total) by mouth daily for 7 days. Start taking on: July 21, 2020   MAGNESIUM PO Take 1 tablet by mouth daily.   ondansetron 4 MG tablet Commonly known as: Zofran Take 1 tablet (4 mg total) by mouth every 8 (eight) hours as needed for nausea or vomiting.   predniSONE 5 MG tablet Commonly known as: DELTASONE Take 5mg  per day through mid-nov 2021 -> then 5mg  Monday, Wed, Friday -> till further notice   spironolactone 25 MG tablet Commonly known as: ALDACTONE TAKE ONE TABLET BY MOUTH DAILY   tamsulosin 0.4 MG Caps capsule Commonly known as: FLOMAX Take 1 capsule (0.4 mg total) by mouth daily.   vitamin C 1000 MG tablet Take 1,000 mg by mouth daily.   Zinc 30 MG Tabs Take 30 mg by mouth daily.        DISCHARGE INSTRUCTIONS:   Follow-up with urology on the eighth for stone management Follow-up with PCP 1 week urology wanted her to follow-up with cardiology and pulmonology.  If you experience worsening of your admission symptoms, develop shortness of breath, life threatening emergency, suicidal or homicidal thoughts you must seek medical attention  immediately by calling 911 or calling your MD immediately  if symptoms less severe.  You Must read complete instructions/literature along with all the possible adverse reactions/side effects for all the Medicines you take and that have been prescribed to you. Take any new Medicines after you have completely understood and accept all the possible adverse reactions/side effects.   Please note  You were cared for by a hospitalist during your hospital stay. If you have any questions about your discharge medications or the care you received while you were in the hospital after you are discharged, you can call the unit and asked to speak with the hospitalist on call if the hospitalist that took care of you is not available. Once you are discharged, your primary care physician will handle any further medical issues. Please note that NO REFILLS for any discharge medications will be authorized once you are discharged, as it is imperative that you return to your primary care physician (or establish a relationship with a primary care physician if you do not have one)  for your aftercare needs so that they can reassess your need for medications and monitor your lab values.    Today   CHIEF COMPLAINT:   Chief Complaint  Patient presents with  . Nephrolithiasis  . Altered Mental Status    HISTORY OF PRESENT ILLNESS:  Anne Rush  is a 59 y.o. female came in with altered mental status and found to have an obstructing stone and sepsis   VITAL SIGNS:  Blood pressure (!) 161/93, pulse 88, temperature 98.2 F (36.8 C), temperature source Oral, resp. rate 17, height  (1.549 m), weight 95.2 kg, SpO2 95 %.   PHYSICAL EXAMINATION:  GENERAL:  59 y.o.-year-old patient lying in the bed with no acute distress.  EYES: Pupils equal, round, reactive to light and accommodation. No scleral icterus. HEENT: Head atraumatic, normocephalic. Oropharynx and nasopharynx clear.  LUNGS: Normal breath sounds bilaterally,  no wheezing, rales,rhonchi or crepitation. No use of accessory muscles of respiration.  CARDIOVASCULAR: S1, S2 normal. No murmurs, rubs, or gallops.  ABDOMEN: Soft, non-tender, non-distended.  EXTREMITIES: No pedal edema.  NEUROLOGIC: Cranial nerves II through XII are intact. Muscle strength 5/5 in all extremities. Sensation intact. Gait not checked.  PSYCHIATRIC: The patient is alert and oriented x 3.  SKIN: No obvious rash, lesion, or ulcer.   DATA REVIEW:   CBC Recent Labs  Lab 07/20/20 0523  WBC 13.4*  HGB 11.9*  HCT 34.2*  PLT 219    Chemistries  Recent Labs  Lab 07/15/20 1002 07/15/20 1002 07/15/20 1718 07/15/20 2144 07/19/20 0624  NA 126*   < > 130*   < > 138  K 3.4*   < > 3.4*   < > 3.4*  CL 93*   < > 98   < > 102  CO2 21*   < > 22   < > 26  GLUCOSE 163*   < > 133*   < > 95  BUN 28*   < > 28*   < > 16  CREATININE 1.73*   < > 1.20*   < > 0.52  CALCIUM 8.6*   < > 8.2*   < > 8.9  MG  --   --  1.5*  --   --   AST 26  --   --   --   --   ALT 15  --   --   --   --   ALKPHOS 51  --   --   --   --   BILITOT 1.7*  --   --   --   --    < > = values in this interval not displayed.     Microbiology Results  Results for orders placed or performed during the hospital encounter of 07/15/20  Respiratory Panel by RT PCR (Flu A&B, Covid) - Nasopharyngeal Swab     Status: None   Collection Time: 07/15/20 10:02 AM   Specimen: Nasopharyngeal Swab  Result Value Ref Range Status   SARS Coronavirus 2 by RT PCR NEGATIVE NEGATIVE Final    Comment: (NOTE) SARS-CoV-2 target nucleic acids are NOT DETECTED.  The SARS-CoV-2 RNA is generally detectable in upper respiratoy specimens during the acute phase of infection. The lowest concentration of SARS-CoV-2 viral copies this assay can detect is 131 copies/mL. A negative result does not preclude SARS-Cov-2 infection and should not be used as the sole basis for treatment or other patient management decisions. A negative result may  occur with  improper specimen collection/handling,  submission of specimen other than nasopharyngeal swab, presence of viral mutation(s) within the areas targeted by this assay, and inadequate number of viral copies (<131 copies/mL). A negative result must be combined with clinical observations, patient history, and epidemiological information. The expected result is Negative.  Fact Sheet for Patients:  https://www.moore.com/  Fact Sheet for Healthcare Providers:  https://www.young.biz/  This test is no t yet approved or cleared by the [place] FDA and  has been authorized for detection and/or diagnosis of SARS-CoV-2 by FDA under an Emergency Use Authorization (EUA). This EUA will remain  in effect (meaning this test can be used) for the duration of the COVID-19 declaration under Section 564(b)(1) of the Act, 21 U.S.C. section 360bbb-3(b)(1), unless the authorization is terminated or revoked sooner.     Influenza A by PCR NEGATIVE NEGATIVE Final   Influenza B by PCR NEGATIVE NEGATIVE Final    Comment: (NOTE) The Xpert Xpress SARS-CoV-2/FLU/RSV assay is intended as an aid in  the diagnosis of influenza from Nasopharyngeal swab specimens and  should not be used as a sole basis for treatment. Nasal washings and  aspirates are unacceptable for Xpert Xpress SARS-CoV-2/FLU/RSV  testing.  Fact Sheet for Patients: https://www.moore.com/  Fact Sheet for Healthcare Providers: https://www.young.biz/  This test is not yet approved or cleared by the [place] FDA and  has been authorized for detection and/or diagnosis of SARS-CoV-2 by  FDA under an Emergency Use Authorization (EUA). This EUA will remain  in effect (meaning this test can be used) for the duration of the  Covid-19 declaration under Section 564(b)(1) of the Act, 21  U.S.C. section 360bbb-3(b)(1), unless the authorization is  terminated or  revoked. Performed at [facility], [address] [place], [place] [address]   Culture, blood (single)     Status: Abnormal   Collection Time: 07/15/20 10:02 AM   Specimen: BLOOD  Result Value Ref Range Status   Specimen Description   Final    BLOOD RIGHT ANTECUBITAL Performed at [facility], [address] [place], [place] [address]    Special Requests   Final    BOTTLES DRAWN AEROBIC AND ANAEROBIC Blood Culture adequate volume Performed at [facility], [address] [place], [place] [address]    Culture  Setup Time   Final    GRAM NEGATIVE RODS IN BOTH AEROBIC AND ANAEROBIC BOTTLES CRITICAL RESULT CALLED TO, READ BACK BY AND VERIFIED WITH: ABBY ELLINGTON AT 8315 ON 07/16/2020 [facility]. Performed at [facility], [address] [place], [place] [address]    Culture PROTEUS MIRABILIS (A)  Final   Report Status 07/18/2020 FINAL  Final   Organism ID, Bacteria PROTEUS MIRABILIS  Final      Susceptibility   Proteus mirabilis - MIC*    AMPICILLIN <=2 SENSITIVE Sensitive     CEFAZOLIN <=4 SENSITIVE Sensitive     CEFEPIME <=0.12 SENSITIVE Sensitive     CEFTAZIDIME <=1 SENSITIVE Sensitive     CEFTRIAXONE <=0.25 SENSITIVE Sensitive     CIPROFLOXACIN <=0.25 SENSITIVE Sensitive     GENTAMICIN <=1 SENSITIVE Sensitive     IMIPENEM 1 SENSITIVE Sensitive     TRIMETH/SULFA <=20 SENSITIVE Sensitive     AMPICILLIN/SULBACTAM <=2 SENSITIVE Sensitive     PIP/TAZO <=4 SENSITIVE Sensitive     * PROTEUS MIRABILIS  Blood Culture ID Panel (Reflexed)     Status: Abnormal   Collection Time: 07/15/20 10:02 AM  Result Value Ref Range Status   Enterococcus faecalis  NOT DETECTED NOT DETECTED Final   Enterococcus Faecium NOT DETECTED NOT DETECTED Final   Listeria monocytogenes NOT DETECTED NOT DETECTED Final   Staphylococcus species NOT DETECTED NOT DETECTED Final   Staphylococcus aureus (BCID) NOT DETECTED NOT DETECTED Final   Staphylococcus epidermidis NOT  DETECTED NOT DETECTED Final   Staphylococcus lugdunensis NOT DETECTED NOT DETECTED Final   Streptococcus species NOT DETECTED NOT DETECTED Final   Streptococcus agalactiae NOT DETECTED NOT DETECTED Final   Streptococcus pneumoniae NOT DETECTED NOT DETECTED Final   Streptococcus pyogenes NOT DETECTED NOT DETECTED Final   A.calcoaceticus-baumannii NOT DETECTED NOT DETECTED Final   Bacteroides fragilis NOT DETECTED NOT DETECTED Final   Enterobacterales DETECTED (A) NOT DETECTED Final    Comment: Enterobacterales represent a large order of gram negative bacteria, not a single organism. CRITICAL RESULT CALLED TO, READ BACK BY AND VERIFIED WITH: ABBY ELLINGTON AT 4098 ON 07/16/2020 [facility].    Enterobacter cloacae complex NOT DETECTED NOT DETECTED Final   Escherichia coli NOT DETECTED NOT DETECTED Final   Klebsiella aerogenes NOT DETECTED NOT DETECTED Final   Klebsiella oxytoca NOT DETECTED NOT DETECTED Final   Klebsiella pneumoniae NOT DETECTED NOT DETECTED Final   Proteus species DETECTED (A) NOT DETECTED Final    Comment: CRITICAL RESULT CALLED TO, READ BACK BY AND VERIFIED WITH: ABBY ELLINGTON AT 1191 ON 07/16/2020 [facility].    Salmonella species NOT DETECTED NOT DETECTED Final   Serratia marcescens NOT DETECTED NOT DETECTED Final   Haemophilus influenzae NOT DETECTED NOT DETECTED Final   Neisseria meningitidis NOT DETECTED NOT DETECTED Final   Pseudomonas aeruginosa NOT DETECTED NOT DETECTED Final   Stenotrophomonas maltophilia NOT DETECTED NOT DETECTED Final   Candida albicans NOT DETECTED NOT DETECTED Final   Candida auris NOT DETECTED NOT DETECTED Final   Candida glabrata NOT DETECTED NOT DETECTED Final   Candida krusei NOT DETECTED NOT DETECTED Final   Candida parapsilosis NOT DETECTED NOT DETECTED Final   Candida tropicalis NOT DETECTED NOT DETECTED Final   Cryptococcus neoformans/gattii NOT DETECTED NOT DETECTED Final   CTX-M ESBL NOT DETECTED NOT DETECTED Final   Carbapenem  resistance IMP NOT DETECTED NOT DETECTED Final   Carbapenem resistance KPC NOT DETECTED NOT DETECTED Final   Carbapenem resistance NDM NOT DETECTED NOT DETECTED Final   Carbapenem resist OXA 48 LIKE NOT DETECTED NOT DETECTED Final   Carbapenem resistance VIM NOT DETECTED NOT DETECTED Final    Comment: Performed at [facility], [address] [place], [place] [address]  Aerobic/Anaerobic Culture (surgical/deep wound)     Status: None (Preliminary result)   Collection Time: 07/15/20  4:18 PM   Specimen: PATH Other; Tissue  Result Value Ref Range Status   Specimen Description   Final    PELVIS LEFT RENAL PELVIS Performed at [facility], [address], [place], [place] [address]    Special Requests   Final    NONE Performed at [facility], [address] [place], [place] [address]    Gram Stain   Final    ABUNDANT WBC PRESENT, PREDOMINANTLY PMN ABUNDANT GRAM NEGATIVE RODS Performed at [facility], [address], [place], [place] [address]    Culture   Final    RARE PROTEUS MIRABILIS NO ANAEROBES ISOLATED; CULTURE IN PROGRESS FOR 5 DAYS    Report Status PENDING  Incomplete   Organism ID, Bacteria PROTEUS MIRABILIS  Final      Susceptibility   Proteus mirabilis - MIC*    AMPICILLIN <=  2 SENSITIVE Sensitive     CEFAZOLIN <=4 SENSITIVE Sensitive     CEFEPIME <=0.12 SENSITIVE Sensitive     CEFTAZIDIME <=1 SENSITIVE Sensitive     CEFTRIAXONE <=0.25 SENSITIVE Sensitive     CIPROFLOXACIN <=0.25 SENSITIVE Sensitive     GENTAMICIN <=1 SENSITIVE Sensitive     IMIPENEM 2 SENSITIVE Sensitive     TRIMETH/SULFA <=20 SENSITIVE Sensitive     AMPICILLIN/SULBACTAM <=2 SENSITIVE Sensitive     PIP/TAZO <=4 SENSITIVE Sensitive     * RARE PROTEUS MIRABILIS  Culture, blood (Routine X 2) w Reflex to ID Panel     Status: None (Preliminary result)   Collection Time: 07/18/20  3:33 PM   Specimen: BLOOD  Result Value Ref Range Status   Specimen Description  BLOOD LEFT ANTECUBITAL  Final   Special Requests   Final    BOTTLES DRAWN AEROBIC AND ANAEROBIC Blood Culture adequate volume   Culture   Final    NO GROWTH 2 DAYS Performed at [facility], [address] [place], [place] [address]    Report Status PENDING  Incomplete  Culture, blood (Routine X 2) w Reflex to ID Panel     Status: None (Preliminary result)   Collection Time: 07/18/20  3:39 PM   Specimen: BLOOD  Result Value Ref Range Status   Specimen Description BLOOD BLOOD LEFT HAND  Final   Special Requests   Final    BOTTLES DRAWN AEROBIC AND ANAEROBIC Blood Culture results may not be optimal due to an inadequate volume of blood received in culture bottles   Culture   Final    NO GROWTH 2 DAYS Performed at [facility], [address], [place], [place] [address]    Report Status PENDING  Incomplete     Management plans discussed with the patient, family and they are in agreement.  CODE STATUS:     Code Status Orders  (From admission, onward)         Start     Ordered   07/15/20 1653  Full code  Continuous        07/15/20 1652        Code Status History    Date Active Date Inactive Code Status Order ID Comments User Context   04/02/2020 1017 04/05/2020 1819 Full Code [id number]  Clydie Braun, MD ED   02/26/2016 1832 02/27/2016 1654 Full Code [id number]  Sheilah Pigeon, PA-C Inpatient   02/26/2016 1832 02/26/2016 1832 Full Code [id number]  Regan Lemming, MD Inpatient   Advance Care Planning Activity      TOTAL TIME TAKING CARE OF THIS PATIENT: 32 minutes.    Alford Highland M.D on 07/20/2020 at 11:29 AM  Between 7am to 6pm - Pager - [phone]  After 6pm go to [email or web address] - password [email or web address]  [facility]  CC: Primary care physician; [name], MD

## 2020-07-20 NOTE — Discharge Instructions (Signed)
Pyelonephritis, Adult  Pyelonephritis is an infection that occurs in the kidney. The kidneys are organs that help clean the blood by moving waste out of the blood and into the pee (urine). This infection can happen quickly, or it can last for a long time. In most cases, it clears up with treatment and does not cause other problems. What are the causes? This condition may be caused by:  Germs (bacteria) going from the bladder up to the kidney. This may happen after having a bladder infection.  Germs going from the blood to the kidney. What increases the risk? This condition is more likely to develop in:  Pregnant women.  Older people.  People who have any of these conditions: ? Diabetes. ? Inflammation of the prostate gland (prostatitis), in males. ? Kidney stones or bladder stones. ? Other problems with the kidney or the parts of your body that carry pee from the kidneys to the bladder (ureters). ? Cancer.  People who have a small, thin tube (catheter) placed in the bladder.  People who are sexually active.  Women who use a medicine that kills sperm (spermicide) to prevent pregnancy.  People who have had a prior urinary tract infection (UTI). What are the signs or symptoms? Symptoms of this condition include:  Peeing often.  A strong urge to pee right away.  Burning or stinging when peeing.  Belly pain.  Back pain.  Pain in the side (flank area).  Fever or chills.  Blood in the pee, or dark pee.  Feeling sick to your stomach (nauseous) or throwing up (vomiting). How is this treated? This condition may be treated by:  Taking antibiotic medicines by mouth (orally).  Drinking enough fluids. If the infection is bad, you may need to stay in the hospital. You may be given antibiotics and fluids that are put directly into a vein through an IV tube. In some cases, other treatments may be needed. Follow these instructions at home: Medicines  Take your antibiotic  medicine as told by your doctor. Do not stop taking the antibiotic even if you start to feel better.  Take over-the-counter and prescription medicines only as told by your doctor. General instructions   Drink enough fluid to keep your pee pale yellow.  Avoid caffeine, tea, and carbonated drinks.  Pee (urinate) often. Avoid holding in pee for long periods of time.  Pee before and after sex.  After pooping (having a bowel movement), women should wipe from front to back. Use each tissue only once.  Keep all follow-up visits as told by your doctor. This is important. Contact a doctor if:  You do not feel better after 2 days.  Your symptoms get worse.  You have a fever. Get help right away if:  You cannot take your medicine or drink fluids as told.  You have chills and shaking.  You throw up.  You have very bad pain in your side or back.  You feel very weak or you pass out (faint). Summary  Pyelonephritis is an infection that occurs in the kidney.  In most cases, this infection clears up with treatment and does not cause other problems.  Take your antibiotic medicine as told by your doctor. Do not stop taking the antibiotic even if you start to feel better.  Drink enough fluid to keep your pee pale yellow. This information is not intended to replace advice given to you by your health care provider. Make sure you discuss any questions you have with   your health care provider. Document Revised: 07/11/2018 Document Reviewed: 07/11/2018 Elsevier Patient Education  2020 [employer] Inc[employer]  

## 2020-07-21 ENCOUNTER — Telehealth: Payer: Self-pay | Admitting: Internal Medicine

## 2020-07-21 ENCOUNTER — Telehealth: Payer: Self-pay | Admitting: Pulmonary Disease

## 2020-07-21 ENCOUNTER — Other Ambulatory Visit: Payer: Self-pay | Admitting: Radiology

## 2020-07-21 ENCOUNTER — Encounter: Payer: Self-pay | Admitting: Cardiology

## 2020-07-21 DIAGNOSIS — N201 Calculus of ureter: Secondary | ICD-10-CM

## 2020-07-21 LAB — AEROBIC/ANAEROBIC CULTURE W GRAM STAIN (SURGICAL/DEEP WOUND)

## 2020-07-21 NOTE — Telephone Encounter (Signed)
Given her ILD and pred taper is best she comes in for a visit this wweek with an app - does simple walk test and gets preop resp eval done formally. Can do in [place] or [place]

## 2020-07-21 NOTE — Telephone Encounter (Signed)
   Primary Cardiologist: [name], MD  Chart reviewed as part of pre-operative protocol coverage. Patient was contacted 07/21/2020 in reference to pre-operative risk assessment for pending surgery as outlined below.  Anne Rush was last seen on 05/13/2020 virtually by Dr. Flora Lipps.  Since that day, Anne Rush has done well from cardiac perspective.  Therefore, based on ACC/AHA guidelines, the patient would be at acceptable risk for the planned procedure without further cardiovascular testing.   The patient was advised that if she develops new symptoms prior to surgery to contact our office to arrange for a follow-up visit, and she verbalized understanding.  I will route this recommendation to the requesting party via Epic fax function and remove from pre-op pool. Please call with questions.  Message has been sent to Dr. Marchelle Gearing of pulmonology service given her significant pulmonary issue. Will need pulmonology input.   Anne Rush, [place] 07/21/2020, 9:55 AM

## 2020-07-21 NOTE — Telephone Encounter (Signed)
ATC Amy--no answer with no option to leave vm.  Will call back.

## 2020-07-21 NOTE — Telephone Encounter (Signed)
From a heart standpoint she needs no further testing prior procedures.  She does have pulmonary fibrosis.  Likely would need to reach out the pulmonary to determine if anything needs to be done before her procedure.  Gerri Spore T. Flora Lipps, MD [facility]  [address] [place], [place] [address] [phone]  9:42 AM

## 2020-07-21 NOTE — Telephone Encounter (Signed)
Dr. Marchelle Gearing, patient is cleared from cardiac perspective to proceed with kidney stone procedure, she has h/o ILD and on as needed 2L O2.   From pulmonary perspective, is there any further work up prior to her surgery?

## 2020-07-21 NOTE — Telephone Encounter (Signed)
Spoke to Amy with urology, who is requesting update on surgical clearance.  Surgery is scheduled for 07/28/2020. Form has been faxed to MR at [facility] office.   MR, please advise. Thanks

## 2020-07-21 NOTE — Telephone Encounter (Signed)
Dr. Flora Lipps to review, Mrs. Thune is a pleasant 59 yo female with PMH of CHB s/p PPM, ILD on PRN 2L [place] O2 at home, and HTN. Recent echo obtained in July 2021 showed EF is down to 45-50%. This was obtained in the setting of hospitalization for hypoxia.   I spoke with Mrs. Calvey, she denies any recent chest pain or worsening dyspnea. She still does not do much activity due to the need for oxygen and DOE. Her pulmonary issue is being followed by Dr. Marchelle Gearing.   She is currently recovering from urosepsis and has underwent L ureter stent placement on 10/26, she is due for laser therapy for kidney stone on 11/8. Dr. Flora Lipps, given lack of chest pain and worsening dyspnea, would you be ok to clearing the patient for the surgery despite slight drop in ejection fraction recently? I did instruct the patient to check with her pulmonologist as well prior to the surgery to make sure she is stable from their perspective.   Please send your response to P [facility] PREOP.   Thank you

## 2020-07-21 NOTE — Telephone Encounter (Signed)
error 

## 2020-07-22 ENCOUNTER — Other Ambulatory Visit: Payer: Self-pay

## 2020-07-22 MED ORDER — LEVOFLOXACIN 500 MG PO TABS: 500.0000 mg | ORAL_TABLET | Freq: Every day | ORAL | 0 refills | Status: DC

## 2020-07-22 NOTE — Telephone Encounter (Signed)
Spoke to patient and relayed below message.  Patient stated that she will not be able to come in due to weakness. She stated that she currently sepsis and she is not comfortable coming in to a doctors office. She requested for visit to be by phone. Patient is aware that a walk test is needed and this can not be done by phone.   Dr. Marchelle Gearing, please advise. Thanks

## 2020-07-22 NOTE — Telephone Encounter (Signed)
Anne Rush  We are getting patient in later this week for a formal face-to-face preop respiratory evaluation  Thank you

## 2020-07-22 NOTE — Telephone Encounter (Signed)
Patient called back and is willing to come in foer OV.  Appt scheduled for 07/23/2020 at 10:00 with Beth. Patient is agreeable with going to [facility] office.  Nothing further needed.

## 2020-07-22 NOTE — Telephone Encounter (Signed)
Amy from Urology called and states that she spoke w/ pt and she is willing to come into the office for a visit. Please advise.

## 2020-07-23 ENCOUNTER — Other Ambulatory Visit: Payer: Self-pay

## 2020-07-23 ENCOUNTER — Encounter: Payer: Self-pay | Admitting: Primary Care

## 2020-07-23 ENCOUNTER — Ambulatory Visit: Payer: BC Managed Care – PPO | Admitting: Primary Care

## 2020-07-23 DIAGNOSIS — J849 Interstitial pulmonary disease, unspecified: Secondary | ICD-10-CM | POA: Diagnosis not present

## 2020-07-23 DIAGNOSIS — Z01811 Encounter for preprocedural respiratory examination: Secondary | ICD-10-CM | POA: Insufficient documentation

## 2020-07-23 LAB — CULTURE, BLOOD (ROUTINE X 2)
Culture: NO GROWTH
Culture: NO GROWTH
Special Requests: ADEQUATE

## 2020-07-23 NOTE — Assessment & Plan Note (Signed)
Cleared for surgery by pulmonary. According to pre-op risk stratification score she is considered a low risk for pulmonary complications for surgery. Recommend early ambulation, compression stockings and using IS 10x/hour to help decreased risk of developing post-op pneumonia and pulmonary embolism.   Major Pulmonary risks identified in the multifactorial risk analysis are but not limited to a) pneumonia; b) recurrent intubation risk; c) prolonged or recurrent acute respiratory failure needing mechanical ventilation; d) prolonged hospitalization; e) DVT/Pulmonary embolism; f) Acute Pulmonary edema  Recommend 1. Short duration of surgery as much as possible and avoid paralytic if possible 2. Recovery in step down or ICU with Pulmonary consultation if indicated 3. DVT prophylaxis 4. Aggressive pulmonary toilet with O2, bronchodilatation, and incentive spirometry and early ambulation

## 2020-07-23 NOTE — Progress Notes (Signed)
@Patient  ID: Anne Rush, female    DOB: June 01, 1961, 59 y.o.   MRN: [id number]  Chief Complaint  Patient presents with  . Follow-up    surgical clearance-kidney surgery    Referring provider: [name], MD  HPI: 59 year old female, former smoker. PMH significant for ILD, allergic rhinitis, acute respiratory failure, GERD, chronic systolic heart failure, complete heart block, HTN,  Obesity. Patient of Dr. Marchelle Gearing, last seen on 06/06/20. Diagnosed with ILD in 2012, previous smoker. Significant mold exposure 4 years ago. New oxygen requirement. HRCT showed alternative pattern to UIP but no clear description of air trapping. She may have had RB-ILD in hte past or NSIP based on CT imaging. Serologies are negative. She does not want any biopsies or invasive procedure. Clinically this is not UIP/IPF and there is no evidence of autoimmune ILD. Baseline features overall could be NSIP vs hypersensitivity pneumonitis or smoking related   Previous [facility] pulmonary encounters: 06/06/20 60 y/o F, former smoker, who presented to [facility] on 7/13 with worsening shortness of breath.  The patient reports her increase in shortness of breath began around the first of July. She had noted her oxygen levels to be in the high 80's at home. She reports progressively increased shortness of breath, dyspnea with exertion and difficulty walking a flight of stairs. She has also noted a tightness in the middle of her chest. She has not had a COVID vaccine.  The patient is a Conservator, museum/gallery. She is a former smoker quit 2012 after diagnosis of ILD. She was previously followed by Pulmonary at [facility] (Dr. Ramond Dial, last seen in 2016) for ground glass opacities but has not seen them in several years due to financial constraints. She was supposed to have a lung biopsy. Her symptoms at the time were thought to be RBILD. At that time she quit smoking and her FEV1 increased from 85 to >90 and infiltrates improved on CT.  ER evaluation  included a CTA chest which was negative for PE but showed worsening ILD from her prior CT scan. Troponin flat.   PCCM consulted for pulmonary evaluation. 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 She is planning cystoscopy/ureteroscopy and stent exchange on Monday November 8th with Dr. Vanna [facility]. She has no acute respiratory complaints. Her breathing has stayed the same. She goes most days at home without needing to use oxygen. She may need to use 2L 1-2 times a week for a couple of hours. She monitors her levels and she rarely see O2 below 88% RA.  She has not been vaccinated for covid-19, unsure if she will get it. Not currently smoking, quit in 2012. Her weight remains stable since September. She has some lower back and abdominal pain which she takes hydrocodone at bedtime. She is following with ID and urology. Currenty on Levquin which she will complete 2 days after surgery. Denies acute symptoms of cough, chest tightness, wheezing. Afebrile.   Pulmonary function testing 04/04/20- FVC 2.25 (69%), FEV1 1.92 (75%), ratio 85, DLCOcor 13.23 (66%)  Imaging: 04/03/20 HRCT- Spectrum of findings compatible with fibrotic interstitial lung disease without frank honeycombing or clear apicobasilar gradient. Findings have progressed since 2012 chest CT. The regions of immediate subpleural sparing are more suggestive of fibrotic phase nonspecific interstitial pneumonia (NSIP), although usual interstitial pneumonia (UIP)  is on the differential particularly given progression. Findings are indeterminate for UIP   07/15/20 CXR - Mild left base atelectasis. No edema or airspace opacity. Heart upper normal in size. Pacemaker leads attached to right atrium and right ventricle. No evident  adenopathy.   Allergies  Allergen Reactions  . Codeine Hives and Itching  . Losartan Potassium Other (See Comments)  . Neomycin-Polymyxin-Dexameth Itching, Swelling and Other (See Comments)    Swelling and itching of eye   . Oxycodone-Acetaminophen Other (See Comments)    REACTION: unspecified  . Paroxetine Other (See Comments)    unknown  . Sulfonamide Derivatives Other (See Comments)    REACTION: unspecified  . Lisinopril Rash  . Sulfa Antibiotics Hives and Rash         Immunization History  Administered Date(s) Administered  . Influenza Nasal 06/20/2017  . Influenza Whole 07/04/2008, 06/18/2009, 08/27/2010  . Influenza, Seasonal, Injecte, Preservative Fre 09/03/2010, 06/22/2011, 06/27/2012  . Influenza,inj,Quad PF,6+ Mos 07/21/2015, 07/06/2018  . Influenza-Unspecified 06/27/2012, 07/17/2014, 07/19/2016, 07/21/2019  . Td 05/10/2002  . Tdap 10/28/2014    Past Medical History:  Diagnosis Date  . Anxiety   . Family history of ovarian cancer   . Hyperlipidemia   . Hypertension   . Interstitial lung disease (HCC)   . Pollen allergies   . Squamous cell carcinoma 2014   skin cancer  . Subclinical hyperthyroidism 06/17/2014    Tobacco History: Social History   Tobacco Use  Smoking Status Former Smoker  . Packs/day: 0.50  . Years: 30.00  . Pack years: 15.00  . Quit date: 2012  . Years since quitting: 9.8  Smokeless Tobacco Never Used   Counseling given: Not Answered   Outpatient Medications Prior to Visit  Medication Sig Dispense Refill  . carvedilol (COREG) 12.5 MG tablet Take 1 tablet (12.5 mg total) by mouth 2 (two) times daily with a meal. 60 tablet 0  . diazepam (VALIUM) 5 MG tablet TAKE ONE TABLET BY MOUTH EVERY 8 HOURS AS NEEDED FOR ANXIETY (Patient taking differently: Take 5 mg by mouth every 8 (eight) hours as needed for anxiety. ) 30 tablet 3  . famotidine (PEPCID) 20 MG tablet Take 1 tablet (20 mg total) by mouth daily. 30 tablet 0  .  HYDROcodone-acetaminophen (NORCO/VICODIN) 5-325 MG tablet Take 1 tablet by mouth every 6 (six) hours as needed for moderate pain. 16 tablet 0  . levofloxacin (LEVAQUIN) 500 MG tablet Take 1 tablet (500 mg total) by mouth daily for 7 days. 7 tablet 0  . levofloxacin (LEVAQUIN) 500 MG tablet Take 1 tablet (500 mg total) by mouth daily. 3 tablet 0  . ondansetron (ZOFRAN) 4 MG tablet Take 1 tablet (4 mg total) by mouth every 8 (eight) hours as needed for nausea or vomiting. 20 tablet 0  . predniSONE (DELTASONE) 5 MG tablet Take 5mg  per day through mid-nov 2021 -> then 5mg  Monday, Wed, Friday -> till further notice 30 tablet 2  . spironolactone (ALDACTONE) 25 MG tablet TAKE ONE TABLET BY MOUTH DAILY (Patient taking differently: Take 25 mg by mouth daily. ) 90 tablet 0  . tamsulosin (FLOMAX) 0.4 MG CAPS capsule Take 1 capsule (0.4 mg total) by mouth daily. 30 capsule 0  . Ascorbic Acid (VITAMIN C) 1000 MG tablet Take 1,000 mg by mouth daily. (Patient not taking: Reported on 07/23/2020)    . cholecalciferol (VITAMIN D3) 25 MCG (1000 UT) tablet Take 1,000 Units by mouth daily.  (Patient not taking: Reported on 07/23/2020)    .  MAGNESIUM PO Take 1 tablet by mouth daily. (Patient not taking: Reported on 07/23/2020)    . Zinc 30 MG TABS Take 30 mg by mouth daily. (Patient not taking: Reported on 07/23/2020)     No facility-administered medications prior to visit.   Review of Systems  Review of Systems  Constitutional: Negative for chills, diaphoresis and fever.  Respiratory: Negative for cough, chest tightness, shortness of breath and wheezing.   Gastrointestinal: Positive for abdominal pain.  Musculoskeletal: Positive for back pain.    Physical Exam  BP 134/84 (BP Location: Left Arm, Patient Position: Sitting, Cuff Size: Normal)   Pulse 86   Temp (!) 97.2 F (36.2 C) (Temporal)   Ht 5\' 3"  (1.6 m)   Wt 203 lb 6.4 oz (92.3 kg)   SpO2 98%   BMI 36.03 kg/m  Physical Exam Constitutional:       Appearance: Normal appearance.  HENT:     Head: Normocephalic and atraumatic.     Right Ear: Tympanic membrane normal.     Left Ear: Tympanic membrane normal.     Mouth/Throat:     Mouth: Mucous membranes are moist.     Pharynx: Oropharynx is clear.     Comments: Mallampati class II-III Cardiovascular:     Rate and Rhythm: Normal rate and regular rhythm.  Pulmonary:     Effort: Pulmonary effort is normal.     Breath sounds: Normal breath sounds.     Comments: Fine crackles right base  Musculoskeletal:     Cervical back: Normal range of motion and neck supple.  Skin:    General: Skin is warm and dry.  Neurological:     General: No focal deficit present.     Mental Status: She is alert and oriented to person, place, and time. Mental status is at baseline.  Psychiatric:        Mood and Affect: Mood normal.        Behavior: Behavior normal.        Thought Content: Thought content normal.        Judgment: Judgment normal.      Lab Results:  CBC    Component Value Date/Time   WBC 13.4 (H) 07/20/2020 0523   RBC 3.69 (L) 07/20/2020 0523   HGB 11.9 (L) 07/20/2020 0523   HCT 34.2 (L) 07/20/2020 0523   PLT 219 07/20/2020 0523   MCV 92.7 07/20/2020 0523   MCH 32.2 07/20/2020 0523   MCHC 34.8 07/20/2020 0523   RDW 13.2 07/20/2020 0523   LYMPHSABS 1.3 07/17/2020 0441   MONOABS 0.9 07/17/2020 0441   EOSABS 0.0 07/17/2020 0441   BASOSABS 0.1 07/17/2020 0441    BMET    Component Value Date/Time   NA 138 07/19/2020 0624   K 3.4 (L) 07/19/2020 0624   CL 102 07/19/2020 0624   CO2 26 07/19/2020 0624   GLUCOSE 95 07/19/2020 0624   BUN 16 07/19/2020 0624   CREATININE 0.52 07/19/2020 0624   CALCIUM 8.9 07/19/2020 0624   GFRNONAA >60 07/19/2020 0624   GFRAA >60 04/05/2020 1059    BNP    Component Value Date/Time   BNP 171.5 (H) 06/21/2020 1330    ProBNP No results found for: PROBNP  Imaging: DG Chest 2 View  Result Date: 07/15/2020 CLINICAL DATA:  Altered mental  status with cardiac arrhythmia EXAM: CHEST - 2 VIEW COMPARISON:  April 01, 2020 chest radiograph; chest CT April 03, 2019 1 FINDINGS: There is mild left base atelectasis. No edema  or airspace opacity. Heart is upper normal in size with pulmonary vascularity normal. Pacemaker leads are attached to the right atrium and right ventricle. No adenopathy. There is mild degenerative change in the thoracic spine. No pneumothorax. IMPRESSION: Mild left base atelectasis. No edema or airspace opacity. Heart upper normal in size. Pacemaker leads attached to right atrium and right ventricle. No evident adenopathy. Electronically Signed   By: Bretta Bang III M.D.   On: 07/15/2020 10:33   DG OR UROLOGY CYSTO IMAGE ([facility] ONLY)  Result Date: 07/15/2020 There is no interpretation for this exam.  This order is for images obtained during a surgical procedure.  Please See "Surgeries" Tab for more information regarding the procedure.   CT Renal Stone Study  Result Date: 07/13/2020 CLINICAL DATA:  Flank pain, kidney stone suspected. LEFT flank pain. EXAM: CT ABDOMEN AND PELVIS WITHOUT CONTRAST TECHNIQUE: Multidetector CT imaging of the abdomen and pelvis was performed following the standard protocol without IV contrast. COMPARISON:  None. FINDINGS: Lower chest: Lung bases are clear. Hepatobiliary: No focal hepatic lesion. No biliary duct dilatation. Common bile duct is normal. Pancreas: Pancreas is normal. No ductal dilatation. No pancreatic inflammation. Spleen: Normal spleen Adrenals/urinary tract: Adrenal glands normal. Moderate LEFT renal edema. Moderate perinephric stranding on the LEFT. Obstructing calculus at the LEFT ureteral pelvic junction measures 6 mm on image 42/series 2. Additional large calculus in the lower pole of the LEFT kidney measures 17 mm. Cluster of smaller 5 mm calculus also present lower pole the LEFT. No distal ureteral calculi on the LEFT. RIGHT kidney has a single 1 mm calculus. No RIGHT  ureterolithiasis. No bladder calculi. Stomach/Bowel: Stomach, small bowel, appendix, and cecum are normal. The colon and rectosigmoid colon are normal. Vascular/Lymphatic: Abdominal aorta is normal caliber with atherosclerotic calcification. There is no retroperitoneal or periportal lymphadenopathy. No pelvic lymphadenopathy. Reproductive: Uterus and adnexa unremarkable. Other: No free fluid. Musculoskeletal: No aggressive osseous lesion. IMPRESSION: 1. High-grade obstruction of the LEFT kidney with obstructing calculus at the LEFT ureteral pelvic junction. 2. Several additional calculi within lower pole of the LEFT kidney. 3. Single RIGHT renal calculus. Electronically Signed   By: Genevive Bi M.D.   On: 07/13/2020 15:57     Assessment & Plan:    1) RISK FOR PROLONGED MECHANICAL VENTILAION - > 48h  1A) Arozullah - Prolonged mech ventilation risk Arozullah Postperative Pulmonary Risk Score - for mech ventilation dependence >48h USAA, Ann Surg 2000, major non-cardiac surgery) Comment Score  Type of surgery - abd ao aneurysm (27), thoracic (21), neurosurgery / upper abdominal / vascular (21), neck (11) Urologic procedure  4  Emergency Surgery - (11)  0  ALbumin < 3 or poor nutritional state - (9)  0  BUN > 30 -  (8)  0  Partial or completely dependent functional status - (7)  0  COPD -  (6)  0  Age - 60 to 69 (4), > 70  (6)  0  TOTAL  4  Risk Stratifcation scores  - < 10 (0.5%), 11-19 (1.8%), 20-27 (4.2%), 28-40 (10.1%), >40 (26.6%)  0.5%     1B) GUPTA - Prolonged Mech Vent Risk Score source Risk  Guptal post op prolonged mech ventilation > 48h or reintubation < 30 days - ACS 2007-2008 dataset - [email or web address] 1.8 % Risk of mechanical ventilation for >48 hrs after surgery, or unplanned intubation ?30 days of surgery    2) RISK FOR POST OP PNEUMONIA Score source Risk  Chales Abrahams - Post  Op Pnemounia risk   [email or web address] 1.1 % Risk of postoperative pneumonia    R3) ISK FOR ANY POST-OP PULMONARY COMPLICATION Score source Risk  CANET/ARISCAT Score - risk for ANY/ALl pulmonary complications - > risk of in-hospital post-op pulmonary complications (composite including respiratory failure, respiratory infection, pleural effusion, atelectasis, pneumothorax, bronchospasm, aspiration pneumonitis) [employer]es - based on age, anemia, pulse ox, resp infection prior 30d, incision site, duration of surgery, and emergency v elective surgery Low risk 1.6% risk of in-hospital post-op pulmonary complications (composite including respiratory failure, respiratory infection, pleural effusion, atelectasis, pneumothorax, bronchospasm, aspiration pneumonitis)     Pre-operative respiratory examination Cleared for surgery by pulmonary. According to pre-op risk stratification score she is considered a low risk for pulmonary complications for surgery. Recommend early ambulation, compression stockings and using IS 10x/hour to help decreased risk of developing post-op pneumonia and pulmonary embolism.   Major Pulmonary risks identified in the multifactorial risk analysis are but not limited to a) pneumonia; b) recurrent intubation risk; c) prolonged or recurrent acute respiratory failure needing mechanical ventilation; d) prolonged hospitalization; e) DVT/Pulmonary embolism; f) Acute Pulmonary edema  Recommend 1. Short duration of surgery as much as possible and avoid paralytic if possible 2. Recovery in step down or ICU with Pulmonary consultation if indicated 3. DVT prophylaxis 4. Aggressive pulmonary toilet with O2, bronchodilatation, and incentive spirometry and early ambulation  ILD (interstitial lung disease) (HCC) Clinically stable at this time. No acute respiratory symptoms, breathing is baseline. This is not  felt to be UIP/IPF and there is no evidence of autoimmune ILD. Baseline features overall could be NSIP, hypersensitivity pneumonitis or smoking related lung disease. Patient is not interested in biopsies or invasive procedures. She has a follow-up with Dr. Vickie Epley in December 2021.    Glenford Bayley, NP 07/23/2020

## 2020-07-23 NOTE — Patient Instructions (Addendum)
Cleared for surgery by pulmonary team. According to pre-op risk stratification score you are considered a low risk for pulmonary complications for surgery. Recommend early ambulation, compression stockings and using IS 10x/hour to help decreased risk of developing post-op pneumonia, pulmonary complications or pulmonary embolism.   Follow-up: Due for visit in December with Dr. Efraim Kaufmann

## 2020-07-23 NOTE — Assessment & Plan Note (Signed)
Clinically stable at this time. No acute respiratory symptoms, breathing is baseline. This is not felt to be UIP/IPF and there is no evidence of autoimmune ILD. Baseline features overall could be NSIP, hypersensitivity pneumonitis or smoking related lung disease. Patient is not interested in biopsies or invasive procedures. She has a follow-up with Dr. Vickie Epley in December 2021.

## 2020-07-24 ENCOUNTER — Encounter
Admission: RE | Admit: 2020-07-24 | Discharge: 2020-07-24 | Disposition: A | Discharge status: Home / Self Care | Payer: BC Managed Care – PPO | Source: Ambulatory Visit | Attending: Urology | Admitting: Urology

## 2020-07-24 ENCOUNTER — Other Ambulatory Visit: Payer: Self-pay

## 2020-07-24 HISTORY — DX: Gastro-esophageal reflux disease without esophagitis: K21.9

## 2020-07-24 HISTORY — DX: Personal history of urinary calculi: Z87.442

## 2020-07-24 HISTORY — DX: Presence of cardiac pacemaker: Z95.0

## 2020-07-24 NOTE — Patient Instructions (Signed)
Your procedure is scheduled on: Mon 11/8 Report to Day Surgery    Stop by registration upon arrival. To find out your arrival time please call [phone] between 1PM - 3PM on Friday 11/5  Remember: Instructions that are not followed completely may result in serious medical risk,  up to and including death, or upon the discretion of your surgeon and anesthesiologist your  surgery may need to be rescheduled.     _X__ 1. Do not eat food after midnight the night before your procedure.                 No chewing gum or hard candies. You may drink clear liquids up to 2 hours                 before you are scheduled to arrive for your surgery- DO not drink clear                 liquids within 2 hours of the start of your surgery.                 Clear Liquids include:  water, apple juice without pulp, clear Gatorade, G2 or                  Gatorade Zero (avoid Red/Purple/Blue), Black Coffee or Tea (Do not add                 anything to coffee or tea). _____2.   Complete the "Ensure Clear Pre-surgery Clear Carbohydrate Drink" provided to you, 2 hours before arrival. **If you       are diabetic you will be provided with an alternative drink, Gatorade Zero or G2.  __X__2.  On the morning of surgery brush your teeth with toothpaste and water, you                may rinse your mouth with mouthwash if you wish.  Do not swallow any toothpaste of mouthwash.     ___ 3.  No Alcohol for 24 hours before or after surgery.   ___ 4.  Do Not Smoke or use e-cigarettes For 24 Hours Prior to Your Surgery.                 Do not use any chewable tobacco products for at least 6 hours prior to                 Surgery.  ___  5.  Do not use any recreational drugs (marijuana, cocaine, heroin, ecstasy, MDMA or other)                For at least one week prior to your surgery.  Combination of these drugs with anesthesia                May have life threatening results.  ____  6.  Bring all  medications with you on the day of surgery if instructed.   _x___  7.  Notify your doctor if there is any change in your medical condition      (cold, fever, infections).     Do not wear jewelry, make-up, hairpins, clips or nail polish. Do not wear lotions, powders, or perfumes. You may wear deodorant. Do not shave 48 hours prior to surgery. Do not bring valuables to the hospital.    [facility] is not responsible for any belongings or valuables.  Contacts, dentures or bridgework may not be worn into surgery.  Leave your suitcase in the car. After surgery it may be brought to your room. For patients admitted to the hospital, discharge time is determined by your treatment team.   Patients discharged the day of surgery will not be allowed to drive home.   Make arrangements for someone to be with you for the first 24 hours of your Same Day Discharge.    Please read over the following fact sheets that you were given:    __x__ Take these medicines the morning of surgery with A SIP OF WATER:    1. carvedilol (COREG) 12.5 MG tablet  2. diazepam (VALIUM) 5 MG tablet if needed  3. famotidine (PEPCID) 20 MG tablet night before and morning of surgery  4.HYDROcodone-acetaminophen (NORCO/VICODIN) 5-325 MG tablet if needed  5.levofloxacin (LEVAQUIN) 500 MG tablet  6.predniSONE (DELTASONE) 5 MG tablet             7.tamsulosin (FLOMAX) 0.4 MG CAPS capsule ____ Fleet Enema (as directed)   ____ Use CHG Soap (or wipes) as directed  ____ Use Benzoyl Peroxide Gel as instructed  ____ Use inhalers on the day of surgery  ____ Stop metformin 2 days prior to surgery    ____ Take 1/2 of usual insulin dose the night before surgery. No insulin the morning          of surgery.   ____ Stop Coumadin/Plavix/aspirin on   _x___ Stop Anti-inflammatories  No ibuprofen aleve or aspirin products     May take pain medication or plain tylenol as needed   _x___ Stop supplements until after surgery.  May  continue the vitamins.  Don't take the morning of surgery  ____ Bring C-Pap to the hospital.    If you have any questions regarding your pre-procedure instructions,  Please call Pre-admit Testing at [phone] [facility]

## 2020-07-25 ENCOUNTER — Other Ambulatory Visit
Admission: RE | Admit: 2020-07-25 | Discharge: 2020-07-25 | Disposition: A | Discharge status: Home / Self Care | Payer: BC Managed Care – PPO | Source: Ambulatory Visit | Attending: Urology | Admitting: Urology

## 2020-07-25 DIAGNOSIS — Z20822 Contact with and (suspected) exposure to covid-19: Secondary | ICD-10-CM | POA: Insufficient documentation

## 2020-07-25 DIAGNOSIS — Z87891 Personal history of nicotine dependence: Secondary | ICD-10-CM | POA: Diagnosis not present

## 2020-07-25 DIAGNOSIS — J849 Interstitial pulmonary disease, unspecified: Secondary | ICD-10-CM | POA: Diagnosis not present

## 2020-07-25 DIAGNOSIS — Z882 Allergy status to sulfonamides status: Secondary | ICD-10-CM | POA: Diagnosis not present

## 2020-07-25 DIAGNOSIS — Z881 Allergy status to other antibiotic agents status: Secondary | ICD-10-CM | POA: Diagnosis not present

## 2020-07-25 DIAGNOSIS — J8410 Pulmonary fibrosis, unspecified: Secondary | ICD-10-CM | POA: Diagnosis not present

## 2020-07-25 DIAGNOSIS — I442 Atrioventricular block, complete: Secondary | ICD-10-CM | POA: Diagnosis not present

## 2020-07-25 DIAGNOSIS — I110 Hypertensive heart disease with heart failure: Secondary | ICD-10-CM | POA: Diagnosis not present

## 2020-07-25 DIAGNOSIS — Z01812 Encounter for preprocedural laboratory examination: Secondary | ICD-10-CM | POA: Insufficient documentation

## 2020-07-25 DIAGNOSIS — I5022 Chronic systolic (congestive) heart failure: Secondary | ICD-10-CM | POA: Diagnosis not present

## 2020-07-25 DIAGNOSIS — Z950 Presence of cardiac pacemaker: Secondary | ICD-10-CM | POA: Diagnosis not present

## 2020-07-25 DIAGNOSIS — N202 Calculus of kidney with calculus of ureter: Secondary | ICD-10-CM | POA: Diagnosis not present

## 2020-07-25 DIAGNOSIS — Z885 Allergy status to narcotic agent status: Secondary | ICD-10-CM | POA: Diagnosis not present

## 2020-07-25 DIAGNOSIS — Z888 Allergy status to other drugs, medicaments and biological substances status: Secondary | ICD-10-CM | POA: Diagnosis not present

## 2020-07-25 LAB — SARS CORONAVIRUS 2 (TAT 6-24 HRS): SARS Coronavirus 2: NEGATIVE

## 2020-07-25 NOTE — H&P (Signed)
[facility] Perioperative Services  Pre-Admission/Anesthesia Testing Clinical Review  Date: 07/25/20  Patient Demographics:  Name: Anne Rush DOB:   Aug 24, 1961 MRN:   [id number]  Planned Surgical Procedure(s):    Case: [id number] Date/Time: 07/28/20 1345   Procedure: CYSTOSCOPY/URETEROSCOPY/HOLMIUM LASER/STENT Exchange (Left )   Anesthesia type: General   Pre-op diagnosis: left ureteral stone   Location: [facility] OR ROOM 10 / [facility] ORS FOR ANESTHESIA GROUP   Surgeons: Vanna Scotland, MD     NOTE: Available PAT nursing documentation and vital signs have been reviewed. Clinical nursing staff has updated patient's PMH/PSHx, current medication list, and drug allergies/intolerances to ensure comprehensive history available to assist in medical decision making as it pertains to the aforementioned surgical procedure and anticipated anesthetic course.   Clinical Discussion:  Anne Rush is a 59 y.o. female who is submitted for pre-surgical anesthesia review and clearance prior to her undergoing the above procedure.Patient is a Former Smoker (15 pack years; quit 09/2010). Pertinent PMH includes: complete heart block (s/p PPM placement), chronic systolic CHF, diastolic dysfunction, HTN, HLD, interstitial lung disease, DOE, GERD (on daily H2 blocker), thrombocytopenia, anxiety (on BZO).  Patient is followed by cardiology Flora Lipps, MD). She was last seen in the cardiology clinic on 05/13/2020; notes reviewed.  At the time of her clinic visit, patient reported continued exertional dyspnea in the setting of her known interstitial lung disease exacerbation.  Patient continued on corticosteroids following hospitalization several months ago.  Patient with idiopathic pulmonary fibrosis.  Patient reported to be doing well overall from a cardiovascular standpoint.  She denied any chest pain, shortness of breath at rest, palpitations, significant peripheral edema, or presyncope/syncope.   Patient with a past medical history significant for complete heart block.  She underwent permanent pacemaker placement in 2017 with Dr. Elberta Fortis; last interrogation revealed no issues with device.  Last TTE in 03/2020 revealed septal lateral dyskinesis and mildly decreased left ventricular systolic function; LVEF 45-50% (see full interpretation of cardiovascular testing below).  Blood pressure well controlled on beta-blocker and diuretic therapy.  Patient is scheduled to follow-up with cardiology in 1 year.  Patient also followed by pulmonology Marchelle Gearing, MD) for her interstitial lung disease/pulmonary fibrosis.  Patient was last seen in the clinic on 06/06/2020; notes reviewed.  Patient advised a of increased shortness of breath that began around the first part of 03/2020 home pulse oximetry was in the high 80s.  Functional capacity progressively decreasing with complaints of increased shortness of breath at rest, exertion, and with climbing stairs.  Patient noting centralized chest "tightness".  Patient previously followed by pulmonary medicine at [facility], however due to financial issues patient has not been seen since 2016.  Recommendations during her last visit was for a lung biopsy as her symptoms were felt to be RBILD.  With smoking cessation, patient's FEV1 increased from 85 to >90%.  At the time of her clinic visit, patient was in the midst of a flareup of her ILD due to a recent occupational medical exposure.  Patient continued on extended corticosteroid taper.  Patient was scheduled to follow-up with pulmonary medicine in early 08/2020. ADDENDUM: given plans for upcoming surgery, formal face to face visit with pulmonary APP completed on 07/23/2020.   Patient recently admitted to [facility] from 07/15/2020 - 07/20/2020; notes reviewed.  Patient initially presented to the ED with complaints of generalized weakness, vertiginous symptoms, and AMS.  Patient also complained of increased shortness of breath in the  setting of known interstitial lung disease.  Patient having to utilize her supplemental oxygen continuously rather than on a as needed basis.  Patient seen in the ED on 07/13/2020, at which time she underwent CT imaging of her abdomen and pelvis that showed a 6 mm stone at the LEFT UPJ with high-grade obstruction of the kidney. CBC revealed a leukocytosis; WBC 16 (ANC 14.2).  Sodium 126, potassium 3.4, BUN 28, creatinine 1.73, and total bilirubin 1.7 mg/dL. UA (+) for infection revealing 0-5 RBC/hpf, 21-50 WBC/hpf, small amount of LE, and many bacteria; no nitrites. Blood cultures (+) Proteus mirabilis. SARS-CoV-2 (novel coronavirus) was (-). Lactic acid elevated at 2.4 mmol/L.  Patient admitted to the hospital for urosepsis.  Patient was taken to the OR for urgent cystoscopy and placement of ureteral stent.  Infectious disease was consulted while patient was in the hospital, and 2 weeks of culture appropriate antimicrobial coverage will be required for complicated UTI.  Patient improved following ureteral stent placement, IV rehydration, IV antibiotics, and pain control.  Patient was ultimately discharged on 07/20/2020 on a course of oral levofloxacin with instructions to follow-up with urology and PCP.  Patient scheduled to undergo urology procedure on 07/28/2020 with Dr. Vanna Scotland.  Given patient's past medical history significant for cardiopulmonary issues, presurgical cardiac and pulmonary clearance was requested by the performing surgeon's office.  Surgical clearances issued as follows:   Per pulmonology, "patient is cleared for surgery by pulmonology.  According to preoperative stratification score, she is considered a LOW risk for pulmonary complications for surgery".  Recommend early ambulation, compression stockings, and using incentive spirometer 10x/hour to help decrease risk of developing postop pneumonia pulmonary embolism.  Pulmonology requesting that paralytics be avoided if  possible.   Per cardiology, "based on ACC/AHA guidelines, the patient would be at acceptable risk for the planned procedure without further cardiovascular testing". This patient is not on daily anticoagulation therapy.   She denies previous perioperative complications with anesthesia. She underwent a monitored anesthetic course here  in 06/2020 with no documented complications.   Vitals with BMI 07/24/2020 07/23/2020 07/20/2020  Height 5\' 3"  5\' 3"  -  Weight 203 lbs 203 lbs 6 oz -  BMI 35.97 36.04 -  Systolic - 134 161  Diastolic - 84 93  Pulse - 86 88    Providers/Specialists:   NOTE: Primary physician provider listed below. Patient may have been seen by APP or partner within same practice.   PROVIDER  lateral dyskinesis LAST , MD Urology (Surgeon) 07/18/2020 - saw APP as inpatient.   Jaquelyn Bitter, MD Primary Care Provider 04/09/2020  [name], MD Cardiology 05/13/2020  Ninetta Lights, MD Pulmonology 07/23/2020   Allergies:  Codeine, Sulfonamide derivatives, Paroxetine, Lisinopril, Losartan potassium, Neomycin-polymyxin-dexameth, Oxycodone-acetaminophen, and Sulfa antibiotics  Current Home Medications:   No current facility-administered medications for this encounter.   . carvedilol (COREG) 12.5 MG tablet  . cholecalciferol (VITAMIN D3) 25 MCG (1000 UT) tablet  . diazepam (VALIUM) 5 MG tablet  . famotidine (PEPCID) 20 MG tablet  . HYDROcodone-acetaminophen (NORCO/VICODIN) 5-325 MG tablet  . levofloxacin (LEVAQUIN) 500 MG tablet  . MAGNESIUM PO  . ondansetron (ZOFRAN) 4 MG tablet  . predniSONE (DELTASONE) 5 MG tablet  . spironolactone (ALDACTONE) 25 MG tablet  . tamsulosin (FLOMAX) 0.4 MG CAPS capsule  . Zinc 30 MG TABS   History:   Past Medical History:  Diagnosis Date  . Anxiety   . Family history of ovarian cancer   . GERD (gastroesophageal reflux disease)   . History  of kidney stones   . Hyperlipidemia   . Hypertension   .  Interstitial lung disease (HCC)   . Pollen allergies   . Presence of permanent cardiac pacemaker   . Squamous cell carcinoma 2014   skin cancer  . Subclinical hyperthyroidism 06/17/2014   Past Surgical History:  Procedure Laterality Date  . CESAREAN SECTION    . COSMETIC SURGERY     skin cancer face  . CYSTOSCOPY W/ URETERAL STENT PLACEMENT Left 07/15/2020   Procedure: CYSTOSCOPY WITH RETROGRADE PYELOGRAM/URETERAL STENT PLACEMENT;  Surgeon: Riki Altes, MD;  Location: [facility] ORS;  Service: Urology;  Laterality: Left;  . EP IMPLANTABLE DEVICE N/A 02/26/2016   Procedure: Pacemaker Implant;  Surgeon: Will Jorja Loa, MD;  Location: [facility] INVASIVE CV LAB;  Service: Cardiovascular;  Laterality: N/A;  . TUBAL LIGATION     Family History  Problem Relation Age of Onset  . Anxiety disorder Mother   . Hypertension Mother   . Ovarian cancer Mother 17  . Cancer Maternal Aunt        breast  . Heart failure Maternal Grandfather    Social History   Tobacco Use  . Smoking status: Former Smoker    Packs/day: 0.50    Years: 30.00    Pack years: 15.00    Quit date: 2012    Years since quitting: 9.8  . Smokeless tobacco: Never Used  Vaping Use  . Vaping Use: Never used  Substance Use Topics  . Alcohol use: No  . Drug use: No    Pertinent Clinical Results:  LABS: Labs reviewed: Acceptable for surgery.     ECG: Date: 07/15/2020 Time ECG obtained: 0944 AM Rate: 102 bpm Rhythm: Atrial sensed ventricular paced rhythm Intervals: PR 158 ms. QRS 156 ms. QTc 484 ms. ST segment and T wave changes: No evidence of acute ST segment elevation or depression Comparison: Similar to previous tracing obtained on 04/01/2020   IMAGING / PROCEDURES: DIAGNOSTIC CHEST RADIOGRAPH done on 07/15/2020 1. There is mild left base atelectasis. 2. No edema or airspace opacity. 3. Heart is upper normal in size with pulmonary vascularity normal. 4. Pacemaker leads are attached to the right atrium and  right ventricle.  5. No adenopathy.  6. There is mild degenerative change in the thoracic spine.  7. No pneumothorax.  CT RENAL STONE STUDY done on 07/13/2020 1. High-grade obstruction of the LEFT kidney with obstructing calculus at the LEFT ureteral pelvic junction. 2. Several additional calculi within lower pole of the LEFT kidney. 3. Single RIGHT renal calculus  CT CHEST RESOLUTION done on 04/03/2020 1. Spectrum of findings compatible with fibrotic interstitial lung disease without frank honeycombing or clear apicobasilar gradient. Findings have progressed since 2012 chest CT.  2. The regions of immediate subpleural sparing are more suggestive of fibrotic phase nonspecific interstitial pneumonia (NSIP), although usual interstitial pneumonia (UIP) is on the differential particularly given progression. Findings are indeterminate for UIP per consensus guidelines  ECHOCARDIOGRAM done on 04/02/2020 1. Septal-lateral dyskinesis. Left ventricular ejection fraction, by estimation, is 45 to 50%. The left ventricle has mildly decreased function. The left ventricle demonstrates global hypokinesis. There is mild concentric left ventricular hypertrophy. Left ventricular diastolic parameters are consistent with Grade I diastolic dysfunction (impaired relaxation).  2. Right ventricular systolic function is normal. The right ventricular size is normal.  3. The mitral valve is normal in structure. No evidence of mitral valve regurgitation. No evidence of mitral stenosis.  4. The aortic valve is tricuspid. Aortic valve  regurgitation is not visualized. No aortic stenosis is present.  5. The inferior vena cava is normal in size with greater than 50% respiratory variability, suggesting right atrial pressure of 3 mmHg.  Hello that day after  Impression and Plan:  Anne Rush has been referred for pre-anesthesia review and clearance prior to her undergoing the planned anesthetic and procedural courses.  Available labs, pertinent testing, and imaging results were personally reviewed by me. This patient has been appropriately cleared by pulmonology Marchelle Gearing, MD) and cardiology Carmon Ginsberg, MD).   Based on clinical review performed today (07/25/20), barring any significant acute changes in the patient's overall condition, it is anticipated that she will be able to proceed with the planned surgical intervention. Any acute changes in clinical condition may necessitate her procedure being postponed and/or cancelled. Pre-surgical instructions were reviewed with the patient during her PAT appointment and questions were fielded by PAT clinical staff.  Quentin Mulling, MSN, APRN, FNP-C, CEN [facility]  Peri-operative Services Nurse Practitioner Phone: [phone] 07/25/20 10:15 AM  NOTE: This note has been prepared using Dragon dictation software. Despite my best ability to proofread, there is always the potential that unintentional transcriptional errors may still occur from this process.

## 2020-07-27 MED ORDER — LACTATED RINGERS IV SOLN: INTRAVENOUS | Status: DC

## 2020-07-27 MED ORDER — CEFAZOLIN SODIUM-DEXTROSE 2-4 GM/100ML-% IV SOLN
2.0000 g | INTRAVENOUS | Status: AC
  Administered 2020-07-28: 2 g via INTRAVENOUS

## 2020-07-27 MED ORDER — ORAL CARE MOUTH RINSE: 15.0000 mL | Freq: Once | OROMUCOSAL | Status: AC

## 2020-07-27 MED ORDER — CHLORHEXIDINE GLUCONATE 0.12 % MT SOLN: 15.0000 mL | Freq: Once | OROMUCOSAL | Status: AC

## 2020-07-28 ENCOUNTER — Ambulatory Visit
Admission: RE | Admit: 2020-07-28 | Discharge: 2020-07-28 | Disposition: A | Discharge status: Home / Self Care | Payer: BC Managed Care – PPO | Attending: Urology | Admitting: Urology

## 2020-07-28 ENCOUNTER — Encounter
Admission: RE | Disposition: A | Discharge status: Home / Self Care | Payer: Self-pay | Source: Home / Self Care | Attending: Urology

## 2020-07-28 ENCOUNTER — Ambulatory Visit: Payer: BC Managed Care – PPO

## 2020-07-28 ENCOUNTER — Ambulatory Visit: Payer: BC Managed Care – PPO | Admitting: Urgent Care

## 2020-07-28 ENCOUNTER — Other Ambulatory Visit: Payer: Self-pay

## 2020-07-28 ENCOUNTER — Encounter: Payer: Self-pay | Admitting: Urology

## 2020-07-28 DIAGNOSIS — Z20822 Contact with and (suspected) exposure to covid-19: Secondary | ICD-10-CM | POA: Diagnosis not present

## 2020-07-28 DIAGNOSIS — Z950 Presence of cardiac pacemaker: Secondary | ICD-10-CM | POA: Insufficient documentation

## 2020-07-28 DIAGNOSIS — N202 Calculus of kidney with calculus of ureter: Secondary | ICD-10-CM | POA: Diagnosis not present

## 2020-07-28 DIAGNOSIS — Z888 Allergy status to other drugs, medicaments and biological substances status: Secondary | ICD-10-CM | POA: Diagnosis not present

## 2020-07-28 DIAGNOSIS — Z881 Allergy status to other antibiotic agents status: Secondary | ICD-10-CM | POA: Insufficient documentation

## 2020-07-28 DIAGNOSIS — I5022 Chronic systolic (congestive) heart failure: Secondary | ICD-10-CM | POA: Diagnosis not present

## 2020-07-28 DIAGNOSIS — Z882 Allergy status to sulfonamides status: Secondary | ICD-10-CM | POA: Diagnosis not present

## 2020-07-28 DIAGNOSIS — N201 Calculus of ureter: Secondary | ICD-10-CM

## 2020-07-28 DIAGNOSIS — N200 Calculus of kidney: Secondary | ICD-10-CM

## 2020-07-28 DIAGNOSIS — J849 Interstitial pulmonary disease, unspecified: Secondary | ICD-10-CM | POA: Diagnosis not present

## 2020-07-28 DIAGNOSIS — Z885 Allergy status to narcotic agent status: Secondary | ICD-10-CM | POA: Insufficient documentation

## 2020-07-28 DIAGNOSIS — J8410 Pulmonary fibrosis, unspecified: Secondary | ICD-10-CM | POA: Diagnosis not present

## 2020-07-28 DIAGNOSIS — Z87891 Personal history of nicotine dependence: Secondary | ICD-10-CM | POA: Diagnosis not present

## 2020-07-28 DIAGNOSIS — Z466 Encounter for fitting and adjustment of urinary device: Secondary | ICD-10-CM | POA: Diagnosis not present

## 2020-07-28 DIAGNOSIS — I442 Atrioventricular block, complete: Secondary | ICD-10-CM | POA: Diagnosis not present

## 2020-07-28 DIAGNOSIS — I110 Hypertensive heart disease with heart failure: Secondary | ICD-10-CM | POA: Diagnosis not present

## 2020-07-28 HISTORY — PX: CYSTOSCOPY/URETEROSCOPY/HOLMIUM LASER/STENT PLACEMENT: SHX6546

## 2020-07-28 SURGERY — CYSTOSCOPY/URETEROSCOPY/HOLMIUM LASER/STENT PLACEMENT
Anesthesia: General | Laterality: Left

## 2020-07-28 MED ORDER — FENTANYL CITRATE (PF) 100 MCG/2ML IJ SOLN
INTRAMUSCULAR | Status: AC
  Administered 2020-07-28: 25 ug via INTRAVENOUS
  Filled 2020-07-28: qty 2

## 2020-07-28 MED ORDER — SUGAMMADEX SODIUM 500 MG/5ML IV SOLN
INTRAVENOUS | Status: DC | PRN
  Administered 2020-07-28: 200 mg via INTRAVENOUS

## 2020-07-28 MED ORDER — CEFAZOLIN SODIUM-DEXTROSE 2-4 GM/100ML-% IV SOLN
INTRAVENOUS | Status: AC
  Filled 2020-07-28: qty 100

## 2020-07-28 MED ORDER — DEXAMETHASONE SODIUM PHOSPHATE 10 MG/ML IJ SOLN
INTRAMUSCULAR | Status: AC
  Filled 2020-07-28: qty 1

## 2020-07-28 MED ORDER — FENTANYL CITRATE (PF) 100 MCG/2ML IJ SOLN
25.0000 ug | INTRAMUSCULAR | Status: AC | PRN
  Administered 2020-07-28 (×4): 25 ug via INTRAVENOUS

## 2020-07-28 MED ORDER — ONDANSETRON HCL 4 MG/2ML IJ SOLN: 4.0000 mg | Freq: Once | INTRAMUSCULAR | Status: DC | PRN

## 2020-07-28 MED ORDER — PHENYLEPHRINE HCL (PRESSORS) 10 MG/ML IV SOLN
INTRAVENOUS | Status: DC | PRN
  Administered 2020-07-28 (×2): 100 ug via INTRAVENOUS
  Administered 2020-07-28: 50 ug via INTRAVENOUS
  Administered 2020-07-28 (×4): 100 ug via INTRAVENOUS

## 2020-07-28 MED ORDER — DEXMEDETOMIDINE (PRECEDEX) IN NS 20 MCG/5ML (4 MCG/ML) IV SYRINGE
PREFILLED_SYRINGE | INTRAVENOUS | Status: AC
  Filled 2020-07-28: qty 5

## 2020-07-28 MED ORDER — MIDAZOLAM HCL 2 MG/2ML IJ SOLN
INTRAMUSCULAR | Status: AC
  Filled 2020-07-28: qty 2

## 2020-07-28 MED ORDER — SUGAMMADEX SODIUM 500 MG/5ML IV SOLN
INTRAVENOUS | Status: AC
  Filled 2020-07-28: qty 5

## 2020-07-28 MED ORDER — ONDANSETRON HCL 4 MG/2ML IJ SOLN
INTRAMUSCULAR | Status: DC | PRN
  Administered 2020-07-28: 4 mg via INTRAVENOUS

## 2020-07-28 MED ORDER — ONDANSETRON HCL 4 MG/2ML IJ SOLN
INTRAMUSCULAR | Status: AC
  Filled 2020-07-28: qty 2

## 2020-07-28 MED ORDER — LIDOCAINE HCL (PF) 2 % IJ SOLN
INTRAMUSCULAR | Status: AC
  Filled 2020-07-28: qty 5

## 2020-07-28 MED ORDER — PROPOFOL 10 MG/ML IV BOLUS
INTRAVENOUS | Status: AC
  Filled 2020-07-28: qty 20

## 2020-07-28 MED ORDER — PROPOFOL 10 MG/ML IV BOLUS
INTRAVENOUS | Status: DC | PRN
  Administered 2020-07-28: 120 mg via INTRAVENOUS

## 2020-07-28 MED ORDER — MIDAZOLAM HCL 2 MG/2ML IJ SOLN
INTRAMUSCULAR | Status: DC | PRN
  Administered 2020-07-28 (×2): 2 mg via INTRAVENOUS

## 2020-07-28 MED ORDER — DEXMEDETOMIDINE (PRECEDEX) IN NS 20 MCG/5ML (4 MCG/ML) IV SYRINGE
PREFILLED_SYRINGE | INTRAVENOUS | Status: DC | PRN
  Administered 2020-07-28: 8 ug via INTRAVENOUS
  Administered 2020-07-28: 2 ug via INTRAVENOUS
  Administered 2020-07-28: 8 ug via INTRAVENOUS
  Administered 2020-07-28: 2 ug via INTRAVENOUS

## 2020-07-28 MED ORDER — DEXAMETHASONE SODIUM PHOSPHATE 10 MG/ML IJ SOLN
INTRAMUSCULAR | Status: DC | PRN
  Administered 2020-07-28: 10 mg via INTRAVENOUS

## 2020-07-28 MED ORDER — FENTANYL CITRATE (PF) 100 MCG/2ML IJ SOLN
INTRAMUSCULAR | Status: AC
  Filled 2020-07-28: qty 2

## 2020-07-28 MED ORDER — HYDROCODONE-ACETAMINOPHEN 5-325 MG PO TABS: 1.0000 | ORAL_TABLET | Freq: Four times a day (QID) | ORAL | 0 refills | Status: DC | PRN

## 2020-07-28 MED ORDER — LIDOCAINE HCL (CARDIAC) PF 100 MG/5ML IV SOSY
PREFILLED_SYRINGE | INTRAVENOUS | Status: DC | PRN
  Administered 2020-07-28: 100 mg via INTRAVENOUS

## 2020-07-28 MED ORDER — ROCURONIUM BROMIDE 10 MG/ML (PF) SYRINGE
PREFILLED_SYRINGE | INTRAVENOUS | Status: AC
  Filled 2020-07-28: qty 20

## 2020-07-28 MED ORDER — FENTANYL CITRATE (PF) 100 MCG/2ML IJ SOLN
INTRAMUSCULAR | Status: DC | PRN
  Administered 2020-07-28 (×2): 25 ug via INTRAVENOUS
  Administered 2020-07-28: 50 ug via INTRAVENOUS

## 2020-07-28 MED ORDER — ROCURONIUM BROMIDE 100 MG/10ML IV SOLN
INTRAVENOUS | Status: DC | PRN
  Administered 2020-07-28: 50 mg via INTRAVENOUS

## 2020-07-28 MED ORDER — CHLORHEXIDINE GLUCONATE 0.12 % MT SOLN
OROMUCOSAL | Status: AC
  Administered 2020-07-28: 15 mL via OROMUCOSAL
  Filled 2020-07-28: qty 15

## 2020-07-28 SURGICAL SUPPLY — 30 items
BAG DRAIN CYSTO-URO LG1000N (MISCELLANEOUS) ×2 IMPLANT
BASKET ZERO TIP 1.9FR (BASKET) ×2 IMPLANT
BRUSH SCRUB EZ 1% IODOPHOR (MISCELLANEOUS) ×2 IMPLANT
BSKT STON RTRVL ZERO TP 1.9FR (BASKET) ×1
CATH URETL 5X70 OPEN END (CATHETERS) ×2 IMPLANT
CNTNR SPEC 2.5X3XGRAD LEK (MISCELLANEOUS)
CONT SPEC 4OZ STER OR WHT (MISCELLANEOUS)
CONT SPEC 4OZ STRL OR WHT (MISCELLANEOUS)
CONTAINER SPEC 2.5X3XGRAD LEK (MISCELLANEOUS) IMPLANT
DRAPE UTILITY 15X26 TOWEL STRL (DRAPES) ×2 IMPLANT
GLOVE BIO SURGEON STRL SZ 6.5 (GLOVE) ×2 IMPLANT
GOWN STRL REUS W/ TWL LRG LVL3 (GOWN DISPOSABLE) ×2 IMPLANT
GOWN STRL REUS W/TWL LRG LVL3 (GOWN DISPOSABLE) ×4
GUIDEWIRE GREEN .038 145CM (MISCELLANEOUS) IMPLANT
GUIDEWIRE STR DUAL SENSOR (WIRE) ×2 IMPLANT
INFUSOR MANOMETER BAG 3000ML (MISCELLANEOUS) ×2 IMPLANT
INTRODUCER DILATOR DOUBLE (INTRODUCER) IMPLANT
KIT TURNOVER CYSTO (KITS) ×2 IMPLANT
MANIFOLD NEPTUNE II (INSTRUMENTS) ×2 IMPLANT
PACK CYSTO AR (MISCELLANEOUS) ×2 IMPLANT
SET CYSTO W/LG BORE CLAMP LF (SET/KITS/TRAYS/PACK) ×2 IMPLANT
SHEATH URETERAL 12FRX35CM (MISCELLANEOUS) ×2 IMPLANT
SOL .9 NS 3000ML IRR  AL (IV SOLUTION) ×2
SOL .9 NS 3000ML IRR AL (IV SOLUTION) ×1
SOL .9 NS 3000ML IRR UROMATIC (IV SOLUTION) ×1 IMPLANT
STENT URET 6FRX24 CONTOUR (STENTS) ×2 IMPLANT
STENT URET 6FRX26 CONTOUR (STENTS) IMPLANT
SURGILUBE 2OZ TUBE FLIPTOP (MISCELLANEOUS) ×2 IMPLANT
TRACTIP FLEXIVA PULSE ID 200 (Laser) ×4 IMPLANT
WATER STERILE IRR 1000ML POUR (IV SOLUTION) ×2 IMPLANT

## 2020-07-28 NOTE — Anesthesia Preprocedure Evaluation (Addendum)
Anesthesia Evaluation  Patient identified by MRN, date of birth, ID band Patient awake    Reviewed: Allergy & Precautions, NPO status , Patient's Chart, lab work & pertinent test results  History of Anesthesia Complications Negative for: history of anesthetic complications  Airway Mallampati: III  TM Distance: >3 FB Neck ROM: Full    Dental  (+) Poor Dentition   Pulmonary neg sleep apnea, neg COPD, former smoker,  ILD, intermittent home O2 use   breath sounds clear to auscultation- rhonchi (-) wheezing      Cardiovascular hypertension, Pt. on medications +CHF  (-) CAD, (-) Past MI, (-) Cardiac Stents and (-) CABG + pacemaker (placed for CHB)  Rhythm:Regular Rate:Normal - Systolic murmurs and - Diastolic murmurs Echo 04/02/20: 1. Septal-lateral dyskinesis. Left ventricular ejection fraction, by  estimation, is 45 to 50%. The left ventricle has mildly decreased  function. The left ventricle demonstrates global hypokinesis. There is  mild concentric left ventricular hypertrophy.  Left ventricular diastolic parameters are consistent with Grade I  diastolic dysfunction (impaired relaxation).  2. Right ventricular systolic function is normal. The right ventricular  size is normal.  3. The mitral valve is normal in structure. No evidence of mitral valve  regurgitation. No evidence of mitral stenosis.  4. The aortic valve is tricuspid. Aortic valve regurgitation is not  visualized. No aortic stenosis is present.  5. The inferior vena cava is normal in size with greater than 50%  respiratory variability, suggesting right atrial pressure of 3 mmHg.    Neuro/Psych neg Seizures PSYCHIATRIC DISORDERS Anxiety negative neurological ROS     GI/Hepatic GERD  ,  Endo/Other  negative endocrine ROSneg diabetes  Renal/GU Renal disease: nephrolithiasis.     Musculoskeletal negative musculoskeletal ROS (+)   Abdominal (+) + obese,    Peds  Hematology negative hematology ROS (+)   Anesthesia Other Findings Past Medical History: No date: Anxiety No date: Family history of ovarian cancer No date: GERD (gastroesophageal reflux disease) No date: History of kidney stones No date: Hyperlipidemia No date: Hypertension No date: Interstitial lung disease (HCC) No date: Pollen allergies No date: Presence of permanent cardiac pacemaker 2014: Squamous cell carcinoma     Comment:  skin cancer 06/17/2014: Subclinical hyperthyroidism   Reproductive/Obstetrics                            Anesthesia Physical Anesthesia Plan  ASA: III  Anesthesia Plan: General   Post-op Pain Management:    Induction: Intravenous  PONV Risk Score and Plan: 2 and Ondansetron, Dexamethasone and Midazolam  Airway Management Planned: Oral ETT  Additional Equipment:   Intra-op Plan:   Post-operative Plan: Extubation in OR  Informed Consent: I have reviewed the patients History and Physical, chart, labs and discussed the procedure including the risks, benefits and alternatives for the proposed anesthesia with the patient or authorized representative who has indicated his/her understanding and acceptance.     Dental advisory given  Plan Discussed with: CRNA and Anesthesiologist  Anesthesia Plan Comments:         Anesthesia Quick Evaluation

## 2020-07-28 NOTE — Interval H&P Note (Signed)
History and Physical Interval Note:  07/28/2020 12:01 PM  Anne Rush  has presented today for surgery, with the diagnosis of left ureteral stone.  The various methods of treatment have been discussed with the patient and family. After consideration of risks, benefits and other options for treatment, the patient has consented to  Procedure(s): CYSTOSCOPY/URETEROSCOPY/HOLMIUM LASER/STENT Exchange (Left) as a surgical intervention.  The patient's history has been reviewed, patient examined, no change in status, stable for surgery.  I have reviewed the patient's chart and labs.  Questions were answered to the patient's satisfaction.    RRR CTAB  Currently on IV abx, last dose this AM  Staged procedure today for management of stone  Risks and benefits of ureteroscopy were reviewed including but not limited to infection, bleeding, pain, ureteral injury which could require open surgery versus prolonged indwelling if ureteral perforation occurs, persistent stone disease, requirement for staged procedure, possible stent, and global anesthesia risks. Patient expressed understanding and desires to proceed with ureteroscopy.    Vanna Scotland

## 2020-07-28 NOTE — Anesthesia Postprocedure Evaluation (Signed)
Anesthesia Post Note  Patient: Anne Rush  Procedure(s) Performed: CYSTOSCOPY WITH RETROGRADE PYELOGRAM/URETERAL STENT PLACEMENT (Left Ureter)  Patient location during evaluation: PACU Anesthesia Type: General Level of consciousness: awake and alert Pain management: pain level controlled Vital Signs Assessment: post-procedure vital signs reviewed and stable Respiratory status: spontaneous breathing, nonlabored ventilation, respiratory function stable and patient connected to nasal cannula oxygen Cardiovascular status: blood pressure returned to baseline and stable Postop Assessment: no apparent nausea or vomiting Anesthetic complications: no   No complications documented.   Last Vitals:  Vitals:   07/20/20 0424 07/20/20 0746  BP: (!) 148/83 (!) 161/93  Pulse: 92 88  Resp:  17  Temp: 37 C 36.8 C  SpO2: 95% 95%    Last Pain:  Vitals:   07/20/20 0746  TempSrc: Oral  PainSc:                  Yevette Edwards

## 2020-07-28 NOTE — Anesthesia Procedure Notes (Signed)
Procedure Name: Intubation Date/Time: 07/28/2020 12:36 PM Performed by: Joanette Gula, Liora Myles, CRNA Pre-anesthesia Checklist: Patient identified, Emergency Drugs available, Suction available and Patient being monitored Patient Re-evaluated:Patient Re-evaluated prior to induction Oxygen Delivery Method: Circle system utilized Preoxygenation: Pre-oxygenation with 100% oxygen Induction Type: IV induction Ventilation: Mask ventilation without difficulty and Oral airway inserted - appropriate to patient size Laryngoscope Size: McGraph and 3 Grade View: Grade I Tube type: Oral Tube size: 7.0 mm Number of attempts: 1 Airway Equipment and Method: Stylet Placement Confirmation: ETT inserted through vocal cords under direct vision,  positive ETCO2 and breath sounds checked- equal and bilateral Secured at: 21 cm Tube secured with: Tape Dental Injury: Teeth and Oropharynx as per pre-operative assessment

## 2020-07-28 NOTE — Op Note (Signed)
Date of procedure: 07/28/20  Preoperative diagnosis:  1. Left proximal ureteral calculus 2. Left kidney stone 3. History sepsis of urinary source/bacteremia  Postoperative diagnosis:  1. Same as above  Procedure: 1. Left ureteroscopy laser lithotripsy for ureteral stone 2. Left ureteroscopy with laser lithotripsy of nonobstructing left lower pole stone 3. Left ureteral stent exchange 4. Interpretation of fluoroscopy less than 30 minutes 5. Basket extraction of stone fragment  Surgeon: Vanna Scotland, MD  Anesthesia: General  Complications: None  Intraoperative findings: 6 mm left proximal ureteral stone in similar location to the CT scan, obliterated and small fragments pushed back into the renal pelvis.  Additionally, 17 mm left lower pole stone as well as some additional nonobstructing stones all treated.  Stone relatively soft.  EBL: Minimal  Specimens: Stone fragment  Drains: 6 x 24 French double-J ureteral stent on left, tether left in place  Indication: Anne Rush is a 59 y.o. patient with a 6 mm obstructing left ureteral calculus who initially presented with sepsis of urinary source/bacteremia which was treated with IV antibiotics who an emergency stent placement.  She returns today for definitive management of her ureteral stone as well as possible treatment of her left lower pole stone concomitantly.  She is still on IV antibiotics and took a dose this morning.  After reviewing the management options for treatment, she elected to proceed with the above surgical procedure(s). We have discussed the potential benefits and risks of the procedure, side effects of the proposed treatment, the likelihood of the patient achieving the goals of the procedure, and any potential problems that might occur during the procedure or recuperation. Informed consent has been obtained.  Description of procedure:  The patient was taken to the operating room and general anesthesia was induced.   The patient was placed in the dorsal lithotomy position, prepped and draped in the usual sterile fashion, and preoperative antibiotics were administered. A preoperative time-out was performed.   A 21 French cystoscope was advanced per urethra into the bladder.  Attention was turned to the left ureteral orifice from which a ureteral stent was seen emanating.  The distal coil of the stent was grasped and brought to level the urethral meatus.  The stent was then cannulated using a sensor wire up to the level of the kidney.  The wire was left in place and the stent was removed which is snapped in place as a safety wire.  A 4.5 semirigid ureteroscope was then advanced all the way up to level the proximal ureter with a 6 mm stone was encountered.  A 200 m laser fiber was then brought in using settings of 0.8 J and 10 Hz, the stone was fragmented into 6 or 7 smaller pieces.  These are small enough to retropulsed into the renal pelvis and all the fragments were pushed up in this direction.  No additional ureteral stones remained at this point in time.  A Super Stiff wire was then placed directly through the scope into the renal pelvis confirmed fluoroscopically and then a semirigid ureteroscope was removed.  I then used a Adriana Simas 12/14 [place] access sheath over the working wire which went easily.  The inner lumen and Super Stiff wire were then removed.  A digital flexible dual-lumen ureteroscope was then brought in and using the settings of 0.2 J and 40 Hz, the previously retropulsed stones were further fragmented into dust like particles.  This stone was relatively soft.  I then advanced my scope into the  lower pole calyx where a much larger 17 mm stone was encountered.  I was able to dust the stone relatively easily.  A 1.9 tipless nitinol basket was used to extract the larger pieces to be sent out for stone analysis.  I then spent some additional time lasering throughout the kidney to ensure that each calyx have been  adequately cleared of stone burden and no additional stone fragments greater than the tip of the laser fiber remained.  I feel like is able to visualize each and every calyx and the entire unit was adequately cleared.  Finally, the scope was backed down the length of the ureter inspecting along the way.  I removed the access sheath at the same time.  In the mid ureter, I did identify few small fragments which were removed using the 1.9 tipless nitinol basket.  The remainder of the ureter was completely cleared.  No ureteral injuries were appreciated.  Finally, a 6 x 24 French double-J ureteral stent was advanced over the safety wire up to level the renal pelvis.  The wire was partially withdrawn until full coils noted within the renal pelvis and then fully withdrawn a full coils noted within the bladder.  The string was left attached to the distal coil of the of the stent which was affixed to the patient's left inner thigh using Mastisol and Tegaderm.  The patient was then cleaned and dried, repositioned in supine position, reversed myesthesia, and taken to the PACU in stable condition.  Plan: We will have her remove her own stent on Wednesday while she is still on IV antibiotics.  Her last dose is Wednesday.  Plan for follow-up in 4 weeks with renal ultrasound prior.  Narcotics prescription was refilled at the patient's request.  All questions answered.  Vanna Scotland, M.D.

## 2020-07-28 NOTE — Transfer of Care (Signed)
Immediate Anesthesia Transfer of Care Note  Patient: Anne Rush  Procedure(s) Performed: CYSTOSCOPY/URETEROSCOPY/HOLMIUM LASER/STENT Exchange (Left )  Patient Location: PACU  Anesthesia Type:General  Level of Consciousness: awake and patient cooperative  Airway & Oxygen Therapy: Patient Spontanous Breathing and Patient connected to face mask oxygen  Post-op Assessment: Report given to RN and Post -op Vital signs reviewed and stable  Post vital signs: Reviewed and stable  Last Vitals:  Vitals Value Taken Time  BP 147/96 07/28/20 1347  Temp 36.2 C 07/28/20 1345  Pulse 80 07/28/20 1347  Resp 14 07/28/20 1347  SpO2 100 % 07/28/20 1347  Vitals shown include unvalidated device data.  Last Pain:  Vitals:   07/28/20 1345  TempSrc:   PainSc: 0-No pain         Complications: No complications documented.

## 2020-07-28 NOTE — Discharge Instructions (Signed)
You have a ureteral stent in place.  This is a tube that extends from your kidney to your bladder.  This may cause urinary bleeding, burning with urination, and urinary frequency.  Please call our office or present to the ED if you develop fevers >101 or pain which is not able to be controlled with oral pain medications.  You may be given either Flomax and/ or ditropan to help with bladder spasms and stent pain in addition to pain medications.    Your stent is on a string.  This is taped to your left inner thigh.  On Wednesday morning, you may untape the stent string and pulled gently until the entire stent is removed.  If you have any issues or are concerned about pulling it out yourself, please call our office and we can arrange to have one of our staff members pull it out for you.  [facility] [address], [address] [place], [place] [address] [phone]

## 2020-07-28 NOTE — Anesthesia Preprocedure Evaluation (Signed)
Anesthesia Evaluation  Patient identified by MRN, date of birth, ID band Patient awake    Reviewed: Allergy & Precautions, H&P , NPO status , Patient's Chart, lab work & pertinent test results, reviewed documented beta blocker date and time   Airway Mallampati: II   Neck ROM: full    Dental  (+) Poor Dentition   Pulmonary pneumonia, former smoker,    Pulmonary exam normal        Cardiovascular hypertension, On Medications +CHF  Normal cardiovascular exam+ dysrhythmias + pacemaker  Rhythm:regular Rate:Normal     Neuro/Psych PSYCHIATRIC DISORDERS negative neurological ROS     GI/Hepatic Neg liver ROS, GERD  Medicated,  Endo/Other  Hyperthyroidism   Renal/GU Renal disease  negative genitourinary   Musculoskeletal   Abdominal   Peds  Hematology negative hematology ROS (+)   Anesthesia Other Findings Past Medical History: No date: Anxiety No date: Family history of ovarian cancer No date: GERD (gastroesophageal reflux disease) No date: History of kidney stones No date: Hyperlipidemia No date: Hypertension No date: Interstitial lung disease (HCC) No date: Pollen allergies No date: Presence of permanent cardiac pacemaker 2014: Squamous cell carcinoma     Comment:  skin cancer 06/17/2014: Subclinical hyperthyroidism Past Surgical History: No date: CESAREAN SECTION No date: COSMETIC SURGERY     Comment:  skin cancer face 07/15/2020: CYSTOSCOPY W/ URETERAL STENT PLACEMENT; Left     Comment:  Procedure: CYSTOSCOPY WITH RETROGRADE PYELOGRAM/URETERAL              STENT PLACEMENT;  Surgeon: Riki Altes, MD;                Location: [facility] ORS;  Service: Urology;  Laterality: Left; 02/26/2016: EP IMPLANTABLE DEVICE; N/A     Comment:  Procedure: Pacemaker Implant;  Surgeon: Will Jorja Loa, MD;  Location: [facility];  Service:               Cardiovascular;  Laterality: N/A; No date: TUBAL  LIGATION BMI    Body Mass Index: 39.64 kg/m     Reproductive/Obstetrics negative OB ROS                             Anesthesia Physical Anesthesia Plan  ASA: III  Anesthesia Plan: General   Post-op Pain Management:    Induction:   PONV Risk Score and Plan:   Airway Management Planned:   Additional Equipment:   Intra-op Plan:   Post-operative Plan:   Informed Consent: I have reviewed the patients History and Physical, chart, labs and discussed the procedure including the risks, benefits and alternatives for the proposed anesthesia with the patient or authorized representative who has indicated his/her understanding and acceptance.     Dental Advisory Given  Plan Discussed with: CRNA  Anesthesia Plan Comments:         Anesthesia Quick Evaluation

## 2020-07-29 ENCOUNTER — Other Ambulatory Visit: Payer: Self-pay | Admitting: Radiology

## 2020-07-29 ENCOUNTER — Encounter: Payer: Self-pay | Admitting: Urology

## 2020-07-29 DIAGNOSIS — N201 Calculus of ureter: Secondary | ICD-10-CM

## 2020-07-29 NOTE — Anesthesia Postprocedure Evaluation (Signed)
Anesthesia Post Note  Patient: Anne Rush  Procedure(s) Performed: CYSTOSCOPY/URETEROSCOPY/HOLMIUM LASER/STENT Exchange (Left )  Patient location during evaluation: PACU Anesthesia Type: General Level of consciousness: awake and alert and oriented Pain management: pain level controlled Vital Signs Assessment: post-procedure vital signs reviewed and stable Respiratory status: spontaneous breathing, nonlabored ventilation and respiratory function stable Cardiovascular status: blood pressure returned to baseline and stable Postop Assessment: no signs of nausea or vomiting Anesthetic complications: no   No complications documented.   Last Vitals:  Vitals:   07/28/20 1531 07/28/20 1623  BP: (!) 145/69 140/60  Pulse: 82 83  Resp: 16 16  Temp: (!) 36.3 C   SpO2: 95% 95%    Last Pain:  Vitals:   07/28/20 1623  TempSrc:   PainSc: 2                  Anne Rush

## 2020-07-30 ENCOUNTER — Ambulatory Visit: Payer: BC Managed Care – PPO

## 2020-07-30 ENCOUNTER — Other Ambulatory Visit: Payer: Self-pay

## 2020-07-30 ENCOUNTER — Encounter: Payer: Self-pay | Admitting: Family Medicine

## 2020-07-30 DIAGNOSIS — N201 Calculus of ureter: Secondary | ICD-10-CM

## 2020-07-30 LAB — CALCULI, WITH PHOTOGRAPH (CLINICAL LAB)
Carbonate Apatite: 20 %
Mg NH4 PO4 (Struvite): 80 %
Weight Calculi: 22 mg

## 2020-07-30 NOTE — Progress Notes (Signed)
Patient present today for string stent removal. String was visualized with out difficulty attached to patient's inner thigh. Tegaderm was removed and string was gently pulled and stent was removed with out difficulty. Patient tolerated well, no complications.

## 2020-07-31 ENCOUNTER — Telehealth: Payer: Self-pay | Admitting: *Deleted

## 2020-07-31 NOTE — Telephone Encounter (Signed)
Patient called Triage line to discuss symptoms, she has been having mild flank pain and feeling lightheaded. She reports temp 98.6, pulse 93. Advised per Dr. Apolinar Junes to take Motrin and Flomax, stay hydrated. If symptoms persist to seek medical attention and call or go to ER. Voiced understanding.

## 2020-08-05 ENCOUNTER — Encounter: Payer: Self-pay | Admitting: Family Medicine

## 2020-08-05 NOTE — Progress Notes (Signed)
Anne Rush T. Anne Hocutt, MD, CAQ Sports Medicine  Primary Care and Sports Medicine [facility] [address] [place], [address]  Phone: [phone]  FAX: [phone]  Anne Rush - 59 y.o. female  MRN [id number]  Date of Birth: 1961/09/03  Date: 08/06/2020  PCP: [name], MD  Referral: [name], MD  Chief Complaint  Patient presents with  . Hospitalization Follow-up    This visit occurred during the SARS-CoV-2 public health emergency.  Safety protocols were in place, including screening questions prior to the visit, additional usage of staff PPE, and extensive cleaning of exam room while observing appropriate contact time as indicated for disinfecting solutions.   Subjective:   Anne Rush is a 59 y.o. very pleasant female patient with Body mass index is 35.07 kg/m. who presents with the following:  She has a complex medical patient with numerous medical problems including pulmonary fibrosis and history of third-degree heart block with a pacemaker.  She also has high blood pressure, hyperlipidemia She has a history also of congestive heart failure with systolic and diastolic dysfunction.  She is a patient of Dr. Marchelle Gearing in the pulmonary fibrosis clinic. She is a patient of Dr. Elberta Fortis with Cardiology.  She had surgery on July 28, 2020.  She was admitted on July 15, 2020.  She had severe sepsis secondary to acute pyelonephritis.  She did have positive blood cultures for Proteus.  She received IV Rocephin in the hospital and was transitioned positioned to Levaquin in an outpatient setting.  She also had a urgent stent placed by Dr. Lonna Cobb on July 15, 2020.  On July 28, 2020, the patient did have lithotripsy as well as a ureteral stent exchanged.  Hypertension and congestive heart failure: The patient is on both Coreg and Aldactone.  While in the hospital, she also did have some acute renal failure with  an elevation of her creatinine to 1.73.  This was normal at 0.52 at the time of discharge.  Thrombocytopenia, and this resolved after her discharge.  The platelet count had a low point of 66,000.  We spoke via MyChart, and I think given the severity of her sepsis and prolonged hospitalization, recommended that she come in for further evaluation and clarity regarding all of her medication usage.  Her blood pressure has been elevated after leaving the hospital, and they did stop her hydrochlorothiazide and lowered her Coreg dosing.  Previously she was taking 25 mg p.o. twice daily, and she tolerated this without difficulty.  She is also historically had some tachycardia.  She also has congestive heart failure.  Her energy levels are quite low compared to her baseline.  148/95  Follow-up CBC, BMP.  Patient Active Problem List   Diagnosis Date Noted  . Chronic systolic CHF (congestive heart failure) (HCC) 07/15/2020    Priority: High  . Interstitial lung disease (HCC) 04/18/2020    Priority: High  . Pacemaker 10/01/2018    Priority: High  . Complete heart block (HCC) s/p Pacemaker 02/26/2016    Priority: High  . Hyperlipidemia LDL goal <100 06/21/2007    Priority: Medium  . GAD (generalized anxiety disorder) 06/05/2007    Priority: Medium  . Essential hypertension 06/05/2007    Priority: Medium  . GERD (gastroesophageal reflux disease) 07/15/2020  . Severe sepsis (HCC) 07/15/2020  . Diastolic dysfunction 04/02/2020  . Vitamin D deficiency 01/11/2018  . Allergic angioedema 03/17/2016  . Former tobacco use 08/28/2008  . ALLERGIC RHINITIS  06/05/2007    Past Medical History:  Diagnosis Date  . Chronic systolic CHF (congestive heart failure) (HCC) 07/15/2020  . Complete heart block (HCC) 02/26/2016   s/p Pacemaker  . Family history of ovarian cancer   . Generalized anxiety disorder 07/15/2020  . GERD (gastroesophageal reflux disease)   . History of kidney stones   .  Hyperlipidemia   . Hypertension   . Interstitial lung disease (HCC)   . Pollen allergies   . Presence of permanent cardiac pacemaker   . Squamous cell carcinoma 2014   skin cancer  . Subclinical hyperthyroidism 06/17/2014    Past Surgical History:  Procedure Laterality Date  . CESAREAN SECTION    . COSMETIC SURGERY     skin cancer face  . CYSTOSCOPY W/ URETERAL STENT PLACEMENT Left 07/15/2020   Procedure: CYSTOSCOPY WITH RETROGRADE PYELOGRAM/URETERAL STENT PLACEMENT;  Surgeon: Riki Altes, MD;  Location: [facility] ORS;  Service: Urology;  Laterality: Left;  . CYSTOSCOPY/URETEROSCOPY/HOLMIUM LASER/STENT PLACEMENT Left 07/28/2020   Procedure: CYSTOSCOPY/URETEROSCOPY/HOLMIUM LASER/STENT Exchange;  Surgeon: Vanna Scotland, MD;  Location: [facility] ORS;  Service: Urology;  Laterality: Left;  . EP IMPLANTABLE DEVICE N/A 02/26/2016   Procedure: Pacemaker Implant;  Surgeon: Will Jorja Loa, MD;  Location: [facility];  Service: Cardiovascular;  Laterality: N/A;  . TUBAL LIGATION      Family History  Problem Relation Age of Onset  . Anxiety disorder Mother   . Hypertension Mother   . Ovarian cancer Mother 61  . Cancer Maternal Aunt        breast  . Heart failure Maternal Grandfather      Review of Systems is noted in the HPI, as appropriate  Objective:   BP (!) 140/100   Pulse 85   Temp (!) 97.5 F (36.4 C) (Temporal)   Ht 5\' 3"  (1.6 m)   Wt 198 lb (89.8 kg)   SpO2 97%   BMI 35.07 kg/m   GEN: No acute distress; alert,appropriate. PULM: Breathing comfortably in no respiratory distress PSYCH: Normally interactive.  CV: RRR, no m/g/r  PULM: Normal respiratory rate, no accessory muscle use. No wheezes, crackles or rhonchi   Laboratory and Imaging Data: Hospital labs, data, imaging was all reviewed  Assessment and Plan:     ICD-10-CM   1. Severe sepsis (HCC)  A41.9    R65.20   2. Interstitial lung disease (HCC)  J84.9   3. Acute renal injury (HCC)  N17.9 Basic  metabolic panel  4. Nephrolithiasis  N20.0   5. Thrombocytopenia (HCC)  D69.6 CBC with Differential/Platelet  6. Acute pyelonephritis  N10   7. Chronic systolic CHF (congestive heart failure) (HCC)  I50.22    Still recovering from acute urosepsis requiring ICU admission.  Interstitial lung disease has increased some demands post hospitalization, and she is requiring some oxygen at night now.  Thrombocytopenia in the hospital, follow-up CBC.  Acute renal injury in the hospital, follow-up BMP.  I encouraged her to continue to eat to help with her decreased energy and her most recent albumin was 3.4.  Meds ordered this encounter  Medications  . carvedilol (COREG) 25 MG tablet    Sig: Take 1 tablet (25 mg total) by mouth 2 (two) times daily with a meal.    Dispense:  1 tablet    Refill:  0   Medications Discontinued During This Encounter  Medication Reason  . ondansetron (ZOFRAN) 4 MG tablet Completed Course  . carvedilol (COREG) 12.5 MG tablet  Orders Placed This Encounter  Procedures  . CBC with Differential/Platelet  . Basic metabolic panel    Follow-up: No follow-ups on file.  Signed,  Elpidio Galea. Namir Neto, MD   Outpatient Encounter Medications as of 08/06/2020  Medication Sig  . carvedilol (COREG) 25 MG tablet Take 1 tablet (25 mg total) by mouth 2 (two) times daily with a meal.  . cholecalciferol (VITAMIN D3) 25 MCG (1000 UT) tablet Take 2,000 Units by mouth daily.   . diazepam (VALIUM) 5 MG tablet TAKE ONE TABLET BY MOUTH EVERY 8 HOURS AS NEEDED FOR ANXIETY  . famotidine (PEPCID) 20 MG tablet Take 1 tablet (20 mg total) by mouth daily.  [name] HYDROcodone-acetaminophen (NORCO/VICODIN) 5-325 MG tablet Take 1 tablet by mouth every 6 (six) hours as needed for moderate pain.  [name] MAGNESIUM PO Take 400 mg by mouth once a week.   . predniSONE (DELTASONE) 5 MG tablet Take 5mg  per day through mid-nov 2021 -> then 5mg  Monday, Wed, Friday -> till further notice  . spironolactone  (ALDACTONE) 25 MG tablet TAKE ONE TABLET BY MOUTH DAILY  . tamsulosin (FLOMAX) 0.4 MG CAPS capsule Take 1 capsule (0.4 mg total) by mouth daily.  . Zinc 30 MG TABS Take 30 mg by mouth daily.   . [DISCONTINUED] carvedilol (COREG) 12.5 MG tablet Take 1 tablet (12.5 mg total) by mouth 2 (two) times daily with a meal.  . [DISCONTINUED] ondansetron (ZOFRAN) 4 MG tablet Take 1 tablet (4 mg total) by mouth every 8 (eight) hours as needed for nausea or vomiting.   No facility-administered encounter medications on file as of 08/06/2020.

## 2020-08-06 ENCOUNTER — Other Ambulatory Visit: Payer: Self-pay

## 2020-08-06 ENCOUNTER — Ambulatory Visit: Payer: BC Managed Care – PPO | Admitting: Family Medicine

## 2020-08-06 ENCOUNTER — Encounter: Payer: Self-pay | Admitting: Family Medicine

## 2020-08-06 VITALS — BP 140/100 | HR 85 | Temp 97.5°F | Ht 63.0 in | Wt 198.0 lb

## 2020-08-06 DIAGNOSIS — R6520 Severe sepsis without septic shock: Secondary | ICD-10-CM

## 2020-08-06 DIAGNOSIS — N200 Calculus of kidney: Secondary | ICD-10-CM

## 2020-08-06 DIAGNOSIS — N179 Acute kidney failure, unspecified: Secondary | ICD-10-CM

## 2020-08-06 DIAGNOSIS — D696 Thrombocytopenia, unspecified: Secondary | ICD-10-CM | POA: Diagnosis not present

## 2020-08-06 DIAGNOSIS — J849 Interstitial pulmonary disease, unspecified: Secondary | ICD-10-CM | POA: Diagnosis not present

## 2020-08-06 DIAGNOSIS — N10 Acute tubulo-interstitial nephritis: Secondary | ICD-10-CM

## 2020-08-06 DIAGNOSIS — A419 Sepsis, unspecified organism: Secondary | ICD-10-CM | POA: Diagnosis not present

## 2020-08-06 DIAGNOSIS — I5022 Chronic systolic (congestive) heart failure: Secondary | ICD-10-CM

## 2020-08-06 LAB — CBC WITH DIFFERENTIAL/PLATELET
Basophils Absolute: 0 10*3/uL (ref 0.0–0.1)
Basophils Relative: 0.8 % (ref 0.0–3.0)
Eosinophils Absolute: 0.1 10*3/uL (ref 0.0–0.7)
Eosinophils Relative: 1 % (ref 0.0–5.0)
HCT: 38.8 % (ref 36.0–46.0)
Hemoglobin: 13.3 g/dL (ref 12.0–15.0)
Lymphocytes Relative: 22.6 % (ref 12.0–46.0)
Lymphs Abs: 1.3 10*3/uL (ref 0.7–4.0)
MCHC: 34.3 g/dL (ref 30.0–36.0)
MCV: 93.1 fl (ref 78.0–100.0)
Monocytes Absolute: 0.4 10*3/uL (ref 0.1–1.0)
Monocytes Relative: 7.4 % (ref 3.0–12.0)
Neutro Abs: 4 10*3/uL (ref 1.4–7.7)
Neutrophils Relative %: 68.2 % (ref 43.0–77.0)
Platelets: 237 10*3/uL (ref 150.0–400.0)
RBC: 4.16 Mil/uL (ref 3.87–5.11)
RDW: 12.7 % (ref 11.5–15.5)
WBC: 5.9 10*3/uL (ref 4.0–10.5)

## 2020-08-06 LAB — BASIC METABOLIC PANEL
BUN: 8 mg/dL (ref 6–23)
CO2: 30 mEq/L (ref 19–32)
Calcium: 9.3 mg/dL (ref 8.4–10.5)
Chloride: 102 mEq/L (ref 96–112)
Creatinine, Ser: 0.6 mg/dL (ref 0.40–1.20)
GFR: 99.47 mL/min (ref >60.00)
Glucose, Bld: 103 mg/dL — ABNORMAL HIGH (ref 70–99)
Potassium: 4.4 mEq/L (ref 3.5–5.1)
Sodium: 139 mEq/L (ref 135–145)

## 2020-08-06 MED ORDER — CARVEDILOL 25 MG PO TABS
25.0000 mg | ORAL_TABLET | Freq: Two times a day (BID) | ORAL | 0 refills | Status: DC
Start: 2020-08-06 — End: 2020-08-07

## 2020-08-07 ENCOUNTER — Telehealth: Payer: Self-pay

## 2020-08-07 NOTE — Telephone Encounter (Signed)
Decrease it back to 12.5 mg po bid

## 2020-08-07 NOTE — Telephone Encounter (Signed)
Aayra notified as instructed by telephone.  Patient states understanding.  Medication list updated.

## 2020-08-07 NOTE — Telephone Encounter (Signed)
Pt was seen yesterday and BP was up. She was advised to increase her carvedilol to the 25 mg bid from what the hospital had decreased 12.5mg  bid. This morning she was feeling strange after taking her morning medication of carvedilol 25mg . Her BP was 83/66 and 90/68. Asking if she should decrease the dosage. Please advise at [phone].

## 2020-08-18 ENCOUNTER — Telehealth: Payer: Self-pay | Admitting: Internal Medicine

## 2020-08-19 NOTE — Telephone Encounter (Signed)
LVM for Anne Rush to call me about her appts. CT Chest appt 12/13 @ 9:00am [facility]. PFT test scheduled 12/13 @ 10:30am [facility] Test scheduled 12/10

## 2020-08-19 NOTE — Telephone Encounter (Signed)
I have now spoke with Anne Rush and she is aware of her appts and the locations

## 2020-08-22 ENCOUNTER — Other Ambulatory Visit: Payer: BC Managed Care – PPO

## 2020-08-25 ENCOUNTER — Other Ambulatory Visit: Payer: Self-pay

## 2020-08-25 ENCOUNTER — Ambulatory Visit
Admission: RE | Admit: 2020-08-25 | Discharge: 2020-08-25 | Disposition: A | Discharge status: Home / Self Care | Payer: BC Managed Care – PPO | Source: Ambulatory Visit | Attending: Urology | Admitting: Urology

## 2020-08-25 DIAGNOSIS — N201 Calculus of ureter: Secondary | ICD-10-CM | POA: Diagnosis not present

## 2020-08-25 DIAGNOSIS — N200 Calculus of kidney: Secondary | ICD-10-CM | POA: Diagnosis not present

## 2020-08-26 ENCOUNTER — Telehealth: Payer: Self-pay | Admitting: Internal Medicine

## 2020-08-26 NOTE — Telephone Encounter (Signed)
Patient is aware of date/time of covid test prior PFT.   

## 2020-08-27 ENCOUNTER — Other Ambulatory Visit: Payer: Self-pay

## 2020-08-27 ENCOUNTER — Ambulatory Visit (INDEPENDENT_AMBULATORY_CARE_PROVIDER_SITE_OTHER): Payer: BC Managed Care – PPO | Admitting: Urology

## 2020-08-27 VITALS — BP 151/87 | HR 79

## 2020-08-27 DIAGNOSIS — N200 Calculus of kidney: Secondary | ICD-10-CM | POA: Diagnosis not present

## 2020-08-27 DIAGNOSIS — N201 Calculus of ureter: Secondary | ICD-10-CM

## 2020-08-27 NOTE — Patient Instructions (Signed)
Please push fluids, cranberry tablets 2x daily and f/u 6 months KUB

## 2020-08-27 NOTE — Progress Notes (Signed)
08/27/2020 4:54 PM   Anne Rush [birth year] [id number]  Referring provider: [name], MD [address] [place],  [place] [address]  Chief Complaint  Patient presents with  . Nephrolithiasis    HPI: 59 year old female who returns today following left ureteroscopy laser lithotripsy for obstructing ureteral calculus as well as a significant left lower pole stone burden.    She initially presented with sepsis of urinary source to the office in initial consultation and was taken emergently for stent followed by staged ureteroscopic procedure.  Her stent was removed several days after the procedure by our staff.  She follows up today with a renal ultrasound which shows resolution of her hydronephrosis.  Small residual a millimeter worth of left lower pole stone burden appreciated.  Stone also consistent with 80% struvite, 20% carbonate apatite.    No further flank pain.  She is feeling significantly improved.  She does mention that she has some bilateral hip pain after she walks around for about 10 minutes in her basement exercising.  No urinary symptoms.  No history of urinary tract infections.  No previous stone episodes.  PMH: Past Medical History:  Diagnosis Date  . Chronic systolic CHF (congestive heart failure) (HCC) 07/15/2020  . Complete heart block (HCC) 02/26/2016   s/p Pacemaker  . Family history of ovarian cancer   . Generalized anxiety disorder 07/15/2020  . GERD (gastroesophageal reflux disease)   . History of kidney stones   . Hyperlipidemia   . Hypertension   . Interstitial lung disease (HCC)   . Pollen allergies   . Presence of permanent cardiac pacemaker   . Squamous cell carcinoma 2014   skin cancer  . Subclinical hyperthyroidism 06/17/2014    Surgical History: Past Surgical History:  Procedure Laterality Date  . CESAREAN SECTION    . COSMETIC SURGERY     skin cancer face  . CYSTOSCOPY W/ URETERAL STENT PLACEMENT Left 07/15/2020    Procedure: CYSTOSCOPY WITH RETROGRADE PYELOGRAM/URETERAL STENT PLACEMENT;  Surgeon: Riki Altes, MD;  Location: [facility];  Service: Urology;  Laterality: Left;  . CYSTOSCOPY/URETEROSCOPY/HOLMIUM LASER/STENT PLACEMENT Left 07/28/2020   Procedure: CYSTOSCOPY/URETEROSCOPY/HOLMIUM LASER/STENT Exchange;  Surgeon: Vanna Scotland, MD;  Location: [facility];  Service: Urology;  Laterality: Left;  . EP IMPLANTABLE DEVICE N/A 02/26/2016   Procedure: Pacemaker Implant;  Surgeon: Will Jorja Loa, MD;  Location: [facility];  Service: Cardiovascular;  Laterality: N/A;  . TUBAL LIGATION      Home Medications:  Allergies as of 08/27/2020      Reactions   Codeine Hives, Itching   Sulfonamide Derivatives Hives   Paroxetine Other (See Comments)   unknown   Lisinopril Rash   Losartan Potassium Other (See Comments)   Leg felt heavy   Neomycin-polymyxin-dexameth Itching, Swelling, Other (See Comments)   Ey drop/Swelling and itching of eye    Oxycodone-acetaminophen Other (See Comments)   Mild SOB, feels funny   Sulfa Antibiotics Hives, Rash         Medication List       Accurate as of August 27, 2020  4:54 PM. If you have any questions, ask your nurse or doctor.        STOP taking these medications   cholecalciferol 25 MCG (1000 UNIT) tablet Commonly known as: VITAMIN D3 Stopped by: Vanna Scotland, MD   HYDROcodone-acetaminophen 5-325 MG tablet Commonly known as: NORCO/VICODIN Stopped by: Vanna Scotland, MD   MAGNESIUM PO Stopped by: Vanna Scotland, MD   tamsulosin  0.4 MG Caps capsule Commonly known as: FLOMAX Stopped by: Vanna Scotland, MD   Zinc 30 MG Tabs Stopped by: Vanna Scotland, MD     TAKE these medications   carvedilol 25 MG tablet Commonly known as: COREG Take 12.5 mg by mouth 2 (two) times daily with a meal.   diazepam 5 MG tablet Commonly known as: VALIUM TAKE ONE TABLET BY MOUTH EVERY 8 HOURS AS NEEDED FOR ANXIETY   famotidine 20 MG tablet Commonly  known as: PEPCID Take 1 tablet (20 mg total) by mouth daily.   predniSONE 5 MG tablet Commonly known as: DELTASONE Take 5mg  per day through mid-nov 2021 -> then 5mg  Monday, Wed, Friday -> till further notice   spironolactone 25 MG tablet Commonly known as: ALDACTONE TAKE ONE TABLET BY MOUTH DAILY       Allergies:  Allergies  Allergen Reactions  . Codeine Hives and Itching  . Sulfonamide Derivatives Hives  . Paroxetine Other (See Comments)    unknown  . Lisinopril Rash  . Losartan Potassium Other (See Comments)    Leg felt heavy  . Neomycin-Polymyxin-Dexameth Itching, Swelling and Other (See Comments)    Ey drop/Swelling and itching of eye   . Oxycodone-Acetaminophen Other (See Comments)    Mild SOB, feels funny  . Sulfa Antibiotics Hives and Rash         Family History: Family History  Problem Relation Age of Onset  . Anxiety disorder Mother   . Hypertension Mother   . Ovarian cancer Mother 23  . Cancer Maternal Aunt        breast  . Heart failure Maternal Grandfather     Social History:  reports that she quit smoking about 9 years ago. She has a 15.00 pack-year smoking history. She has never used smokeless tobacco. She reports that she does not drink alcohol and does not use drugs.   Physical Exam: BP (!) 151/87   Pulse 79   Constitutional:  Alert and oriented, No acute distress. HEENT: [place] AT, moist mucus membranes.  Trachea midline, no masses. Cardiovascular: No clubbing, cyanosis, or edema. Respiratory: Normal respiratory effort, no increased work of breathing. Skin: No rashes, bruises or suspicious lesions. Neurologic: Grossly intact, no focal deficits, moving all 4 extremities. Psychiatric: Normal mood and affect.  Laboratory Data: Lab Results  Component Value Date   WBC 5.9 08/06/2020   HGB 13.3 08/06/2020   HCT 38.8 08/06/2020   MCV 93.1 08/06/2020   PLT 237.0 08/06/2020    Lab Results  Component Value Date   CREATININE 0.60 08/06/2020     Urinalysis UA today negative  Pertinent Imaging: Ultrasound renal complete  Narrative CLINICAL DATA:  Follow-up examination for left ureteral stone.  EXAM: RENAL / URINARY TRACT ULTRASOUND COMPLETE  COMPARISON:  Prior CT from 07/13/2020.  FINDINGS: Right Kidney:  Renal measurements: 10.6 x 5.1 x 5.4 cm = volume: 152.3 mL. Renal echogenicity within normal limits. 8 mm nonobstructive calculus present at the lower pole. No hydronephrosis. No focal renal mass.  Left Kidney:  Renal measurements: 10.7 x 5.3 x 5.3 cm = volume: 158.0 mL. Renal echogenicity within normal limits. 8 mm nonobstructive calculus present at the lower pole. Previously seen hydronephrosis has resolved. No focal renal mass.  Bladder:  Appears normal for degree of bladder distention. Bilateral jets are visualized.  Other:  None.  IMPRESSION: 1. Interval resolution of previously seen left-sided hydronephrosis. 2. Bilateral nonobstructive nephrolithiasis measuring up to 8 mm as above.   Electronically Signed By:  Rise Mu M.D. On: 08/26/2020 00:21  Renal ultrasound personally reviewed today.  Agree with radiologic interpretation.   Assessment & Plan:    1. Left ureteral stone Status post staged ureteroscopy with complete resolution of left-sided ureteral stone and hydronephrosis  She has some small residual left lower pole stone burden which is dramatically improved, likely representing some small amount of residual buried debris  Stone analysis was reviewed with the patient.  We discussed that struvite stone is associated with chronic infection.  Her urinalysis is negative today which is reassuring.  We will plan to keep a fairly close eye on her, KUB in in 6 months to assess for residual recurrent stone burden.  Encourage hydration.  She was given stone prevention guidelines today.  I had also like her to increase her citric acid intake as well as start cranberry tablets  twice daily for UTI prevention.  She is agreeable this plan. - Urinalysis, Complete - CULTURE, URINE COMPREHENSIVE - Abdomen 1 view (KUB); Future  2. Struvite kidney stones As above   Return in about 6 months (around 02/25/2021).  Vanna Scotland, MD  [facility] [address] [place], [place] [address] [phone]

## 2020-08-28 ENCOUNTER — Telehealth: Payer: Self-pay | Admitting: Internal Medicine

## 2020-08-28 NOTE — Telephone Encounter (Signed)
I finally got Anne Rush's CT approved and when I called her today letting her know to keep the appt on 12/13 for the CT.   She wanted to make her OV with Dr. Marchelle Gearing to go over the results of PFT and CT.  She states that she needs to come off the prednisone. Can you call to see where she can be worked into his schedule

## 2020-08-28 NOTE — Telephone Encounter (Signed)
MR, please advise.  Patient would like to schedule OV to review upcoming CT and PFT results and discuss titrating off of prednisone. MR does not have any availability in December or January. She is not willing to travel to [facility] office or see NP.  Dr. Marchelle Gearing, please advise. Thanks

## 2020-08-29 ENCOUNTER — Other Ambulatory Visit
Admission: RE | Admit: 2020-08-29 | Discharge: 2020-08-29 | Disposition: A | Discharge status: Home / Self Care | Payer: BC Managed Care – PPO | Source: Ambulatory Visit | Attending: Internal Medicine | Admitting: Internal Medicine

## 2020-08-29 ENCOUNTER — Other Ambulatory Visit: Payer: Self-pay

## 2020-08-29 DIAGNOSIS — Z20822 Contact with and (suspected) exposure to covid-19: Secondary | ICD-10-CM | POA: Diagnosis not present

## 2020-08-29 DIAGNOSIS — Z01812 Encounter for preprocedural laboratory examination: Secondary | ICD-10-CM | POA: Diagnosis not present

## 2020-08-29 NOTE — Telephone Encounter (Signed)
Lm for patient.  

## 2020-08-29 NOTE — Telephone Encounter (Signed)
Phone visit scheduled for 09/05/2020 at 1:30. Patient voiced her understanding.

## 2020-08-29 NOTE — Telephone Encounter (Signed)
Maybe you can put her in for tele visit on 09/05/20 at 1.30p or 1.45pm when you are all on your holiday luncheon. I can discuss with her over phone

## 2020-08-30 LAB — SARS CORONAVIRUS 2 (TAT 6-24 HRS): SARS Coronavirus 2: NEGATIVE

## 2020-08-30 LAB — CULTURE, URINE COMPREHENSIVE

## 2020-09-01 ENCOUNTER — Other Ambulatory Visit: Payer: Self-pay

## 2020-09-01 ENCOUNTER — Ambulatory Visit: Payer: BC Managed Care – PPO | Attending: Internal Medicine

## 2020-09-01 ENCOUNTER — Ambulatory Visit
Admission: RE | Admit: 2020-09-01 | Discharge: 2020-09-01 | Disposition: A | Discharge status: Home / Self Care | Payer: BC Managed Care – PPO | Source: Ambulatory Visit | Attending: Internal Medicine | Admitting: Internal Medicine

## 2020-09-01 DIAGNOSIS — J849 Interstitial pulmonary disease, unspecified: Secondary | ICD-10-CM

## 2020-09-01 DIAGNOSIS — M47814 Spondylosis without myelopathy or radiculopathy, thoracic region: Secondary | ICD-10-CM | POA: Diagnosis not present

## 2020-09-01 DIAGNOSIS — J432 Centrilobular emphysema: Secondary | ICD-10-CM | POA: Diagnosis not present

## 2020-09-01 DIAGNOSIS — J84112 Idiopathic pulmonary fibrosis: Secondary | ICD-10-CM | POA: Diagnosis not present

## 2020-09-01 DIAGNOSIS — R0609 Other forms of dyspnea: Secondary | ICD-10-CM | POA: Diagnosis not present

## 2020-09-01 LAB — MICROSCOPIC EXAMINATION
Bacteria, UA: NONE SEEN
Epithelial Cells (non renal): NONE SEEN /hpf (ref 0–10)
RBC, Urine: NONE SEEN /hpf (ref 0–2)
WBC, UA: NONE SEEN /hpf (ref 0–5)

## 2020-09-01 LAB — URINALYSIS, COMPLETE
Bilirubin, UA: NEGATIVE
Glucose, UA: NEGATIVE
Ketones, UA: NEGATIVE
Leukocytes,UA: NEGATIVE
Nitrite, UA: NEGATIVE
Protein,UA: NEGATIVE
RBC, UA: NEGATIVE
Specific Gravity, UA: 1.01 (ref 1.005–1.030)
Urobilinogen, Ur: 0.2 mg/dL (ref 0.2–1.0)
pH, UA: 6.5 (ref 5.0–7.5)

## 2020-09-02 LAB — PULMONARY FUNCTION TEST ARMC ONLY
DL/VA % pred: 86 %
DL/VA: 3.69 ml/min/mmHg/L
DLCO unc % pred: 77 %
DLCO unc: 15.24 ml/min/mmHg
FEF 25-75 Pre: 2.24 L/sec
FEF2575-%Pred-Pre: 92 %
FEV1-%Pred-Pre: 81 %
FEV1-Pre: 2.08 L
FEV1FVC-%Pred-Pre: 103 %
FEV6-%Pred-Pre: 80 %
FEV6-Pre: 2.54 L
FEV6FVC-%Pred-Pre: 103 %
FVC-%Pred-Pre: 78 %
Pre FEV1/FVC ratio: 82 %
Pre FEV6/FVC Ratio: 100 %

## 2020-09-05 ENCOUNTER — Other Ambulatory Visit: Payer: Self-pay

## 2020-09-05 ENCOUNTER — Ambulatory Visit (INDEPENDENT_AMBULATORY_CARE_PROVIDER_SITE_OTHER): Payer: BC Managed Care – PPO | Admitting: Internal Medicine

## 2020-09-05 DIAGNOSIS — J849 Interstitial pulmonary disease, unspecified: Secondary | ICD-10-CM | POA: Diagnosis not present

## 2020-09-05 DIAGNOSIS — Z7185 Encounter for immunization safety counseling: Secondary | ICD-10-CM | POA: Diagnosis not present

## 2020-09-05 NOTE — Addendum Note (Signed)
Addended by: Lajoyce Lauber A on: 09/05/2020 02:05 PM   Modules accepted: Orders

## 2020-09-05 NOTE — Progress Notes (Signed)
[name]    History    59 y/o F, former smoker, who presented to [facility] on 7/13 with worsening shortness of breath.   The patient reports her increase in shortness of breath began around the first of July.  She had noted her oxygen levels to be in the high 80's at home.  She reports progressively increased shortness of breath, dyspnea with exertion and difficulty walking a flight of stairs.  She has also noted a tightness in the middle of her chest.  She has not had a COVID vaccine.    The patient is a Clinical cytogeneticist.  She is a former smoker quit 2012 after diagnosis of ILD.  She was previously followed by Pulmonary at [facility] (Dr. Brock Rush, last seen in 2016) for ground glass opacities but has not seen them in several years due to financial constraints. She was supposed to have a lung biopsy. Her symptoms at the time were thought to be RBILD.  At that time she quit smoking and her FEV1 increased from 85 to >90 and infiltrates improved on CT.   ER evaluation included a CTA chest which was negative for PE but showed worsening ILD from her prior CT scan.  Troponin flat.    PCCM consulted for pulmonary evaluation.  Pets: dog in the house Allergies: none Occupation: Clinical cytogeneticist for [employer]: none Travel: none recent   No heavy dust exposures  No bird exposures    [name] [facility] Comprehensive ILD Questionnaire  Symptoms: Recent symptoms started suddenly.  Since it started 1 month ago is getting worse.  There is episodic dyspnea.  Severity is listed below.  She also has a cough that she reports specifically started on Monday, March 31, 2020.  Since it started and with steroids it is better.  It is mild in intensity.  She does bring up some phlegm that is green.  A separate brown speckles.  There is no hemoptysis.  Cough does not get worse when she lies down.  Does not affect her voice.  Does not clear her throat.  No tickle.  All the symptoms started  approximately 5 weeks before admission.  SYMPTOM SCALE - ILD 04/04/2020   O2 use RA - 87% in hospital  Shortness of Breath 0 -> 5 scale with 5 being worst (score 6 If unable to do)  At rest 2  Simple tasks - showers, clothes change, eating, shaving 2  Household (dishes, doing bed, laundry) 5  Shopping 5  Walking level at own pace 4  Walking up Stairs 5  Total (30-36) Dyspnea Score 23  How Rush is your cough? x  How Rush is your fatigue x  How Rush is nausea x  How Rush is vomiting?  x  How Rush is diarrhea? x  How Rush is anxiety? x  How Rush is depression x       Past Medical History : Positive for heart failure.  Third-degree heart block she has a pacer.  This has been going on for several years.  Currently ejection fraction reported as slightly low normal but according to Dr. Maryland Rush and the hospitalist cardiology reviewed the echo again and feel the ejection fraction is normal.  Current BNP is normal.  She has obesity.  Otherwise negative past medical history.  No COPD or asthma.  No collagen vascular disease no scleroderma no lupus no polymyositis.  Autoimmune panel is all negative.  No stroke or thyroid disease.  No  hepatitis.  No history of blood clot.   ROS: Positive for dyspnea.  Was stable dyspnea all the way from 2012 up until recently 5 weeks ago.  There is associated with fatigue and arthralgia.  No dysphagia.  No acid reflux.  No vomiting no nausea.   FAMILY HISTORY of LUNG DISEASE: Positive for COPD in the diet and asthma in the past.  No history of pulmonary fibrosis.  No cystic fibrosis denies hypersensitive pneumonitis no autoimmune disease.   EXPOSURE HISTORY:  -She did smoke cigarettes between 1979 and 2012.  She quit smoking in 2012 when her interstitial lung disease was diagnosed.  She smoked around 10 cigarettes a day.  No cigars.  No passive smoking.  No vaping.  No marijuana use.  No cocaine use.  No intravenous drug use.   HOME and HOBBY  DETAILS : [place]  Is a single-family home is also 59 years of age.  In the house there is no dampness.  There is no mold or mildew in the shower curtain.  There is no mold or mildew anywhere in the house.  There is no humidifier in the house no CPAP use in the house.  No nebulizer machine.  No steam iron.  No Jacuzzi.  No misting [place] in the house.  No pet birds or parakeets.  No pet gerbils.  No feather pillows or blankets.  No music habits.  No gardening habits.  She does throw prone to the deer but there is no mold in the:   OCCUPATIONAL HISTORY (122 questions) : There is significant water damage or mold exposure at work.  She works for [employer] since 2016.  Prior to this she was not working any atmosphere that had dampness of mold.  She states the building is a very old building.  She says that there has been significant water damage and water leak.  Water has port in into the several rooms in the office.  She says there is mold in the hallways.  There is mold in the break room.  There is mold in the bosses office.  She is not sure but she thinks there is mold even in her cubicle.  Is been going on for several years but increased in the last few years.  She says on her desk every morning there are black spots but she does not know what it is.  Her husband showed pictures of the office of the right leg significant dampness and rotting of the walls and floor.  She works as a Radiation protection practitioner.  It is just her and her boss.  Rest of the questionnaire is negative.   PULMONARY TOXICITY HISTORY (27 items): Never been on urinary antibiotic Macrodantin.  Currently on prednisone.   RECENT LABS  Results for Anne, Rush (MRN [id number]) as of 04/04/2020 12:42  Ref. Range 04/02/2020 18:57  Anti Nuclear Antibody (ANA) Latest Ref Range: Negative  Negative  ANCA Proteinase 3 Latest Ref Range: 0.0 - 3.5 U/mL <3.5  CCP  Antibodies IgG/IgA Latest Ref Range: 0 - 19 units 4  Myeloperoxidase Abs Latest Ref Range: 0.0 - 9.0 U/mL <9.0  RA Latex Turbid. Latest Ref Range: 0.0 - 13.9 IU/mL <10.0    CT Chest High Resolution  Result Date: 04/03/2020 CLINICAL DATA:  Inpatient. Evaluate interstitial lung disease. Dyspnea. EXAM: CT CHEST WITHOUT CONTRAST TECHNIQUE: Multidetector CT imaging of the chest was performed  following the standard protocol without intravenous contrast. High resolution imaging of the lungs, as well as inspiratory and expiratory imaging, was performed. COMPARISON:  04/02/2020 chest CT angiogram.  10/08/2010 chest CT. FINDINGS: Cardiovascular: Normal heart size. No significant pericardial effusion/thickening. Two lead left subclavian pacemaker with lead tips in the right atrium and right ventricular apex. Mildly atherosclerotic nonaneurysmal thoracic aorta. Normal caliber pulmonary arteries. Mediastinum/Nodes: No discrete thyroid nodules. Unremarkable esophagus. No pathologically enlarged axillary, mediastinal or hilar lymph nodes, noting limited sensitivity for the detection of hilar adenopathy on this noncontrast study. Lungs/Pleura: No pneumothorax. No pleural effusion. Mild centrilobular and paraseptal emphysema in the upper lobes. No acute consolidative airspace disease or lung masses. Right middle lobe solid 7 mm pulmonary nodule along the minor fissure (series 7/image 65), stable since 2012 CT, considered benign. No additional significant pulmonary nodules. Patchy confluent peripheral reticulation and peripheral lines throughout both lungs with associated mild traction bronchiectasis and architectural distortion, with regions of relative sparing of the immediate subpleural lungs. No clear apicobasilar gradient to these findings. No frank honeycombing (mild subpleural cystic changes in the upper lobes are favored represent paraseptal emphysema). These findings have progressed significantly since 10/08/2010  chest CT. Upper abdomen: No acute abnormality. Musculoskeletal: No aggressive appearing focal osseous lesions. Moderate thoracic spondylosis. IMPRESSION: 1. Spectrum of findings compatible with fibrotic interstitial lung disease without frank honeycombing or clear apicobasilar gradient. Findings have progressed since 2012 chest CT. The regions of immediate subpleural sparing are more suggestive of fibrotic phase nonspecific interstitial pneumonia (NSIP), although usual interstitial pneumonia (UIP) is on the differential particularly given progression. Findings are indeterminate for UIP per consensus guidelines: Diagnosis of Idiopathic Pulmonary Fibrosis: An Official ATS/ERS/JRS/ALAT Clinical Practice Guideline. [place], Iss 5, [phone], May 21 2017. 2. Aortic Atherosclerosis (ICD10-I70.0) and Emphysema (ICD10-J43.9). Electronically Signed   By: Ilona Sorrel M.D.   On: 04/03/2020 17:23   [facility] Events   7/13 Admit  7/15 -  she says in jan 2012 dx with ILD . Jan 2021 was CT. In may 2012 she quti smoking. Followed with Dr Anselmo Rod. Last visit with him and CT was in 2015. But stable till June 2021 and desaturating with exercise at home. Now needing oxygen. She is nervous about procedures incluidng bronch. Feels better after steroids   04/04/2020 - > feels better. 87% on RA at rest now.  Husband reports mild confusion overnight.  He is wondering if the lorazepam, steroids and oxygen are contributing to this.  This morning she not as happy as and cheerful as before.  Otherwise no new complaints.  Overall she feels better since admission and attributes to steroids.  We went over the ILD questionnaire and is documented below.  Assessment & Plan:   #Baseline: Interstitial lung disease -in 2012.  Previous smoker and quit and stable  #Current: Significant mold exposure x4 years especially worse in the last few years.  Interstitial lung disease flareup with new  oxygen dependency this admission.  High-resolution CT chest alternative pattern to UIP but no clear description of air trapping  -Discussion: She might have had RB ILD in the past or NSIP based on the CT scan appearance.  She is doing stable after quitting smoking but in the last few years she has had mold exposure and it has been a deterioration and presentation with symptoms suggestive of ILD flareup.  It appears that systolic heart failure is not under consideration especially with normal BNP.  Serologies  are negative.  She does not want any biopsy or invasive procedures.  Clinically this is not UIP/IPF.  There is no evidence of autoimmune interstitial lung disease.  Baseline features overall could be NSIP or hypersensitive pneumonitis [although no air-trapping] or possibly smokers interstitial lung disease.  However the current flareup seems temporally related to mold exposure.  Is the only inciting antigen that we can find currently.  And therefore this could be a flareup and progression of previous NSIP or she has underlying hypersensitive pneumonitis.  Plan -Under no circumstances she should go back to her work atmosphere where there is mold exposure  -Needs prednisone for few to several weeks and may be 1 few months depending on the course [she seems to be improving although there is some mild confusion last night which could be multifactorial from benzodiazepines and steroids] -> okay to switch to p.o. prednisone April 05, 2020 at 50 mg/day x 2 weeks and then 40 mg/day x 2 weeks and then 30 mg/day x 2 weeks and then 20 mg/day x 2 weeks and then 10 mg/day to continue until further instructions  -Obtain hypersensitive pneumonitis blood panel while in the hospital and blood QuantiFERON gold  -Obtain spirometry and DLCO while in the hospital if possible  -Seems to still need oxygen at rest 2 L [today she is 87% on room air at rest although this is an improvement]  -Meet with [employer] manager for disability information which she is interested  - Prognosis: Unclear prognosis.  It is possible that with steroids over few to several weeks or a few months that she improves and goes back to baseline.  It is also possible that she has only partial improvement.  There is also a small chance she just deals with progressive ILD in which case antifibrotic's would be indicated as an outpatient.  Overall I am optimistic that she would have at least a partial improvement  -She would need to lose weight and get fit (given age below 38 -in the long run she might be a candidate for lung transplant if the need arose] -we will address this in the office  -Hospitalist to deal with encephalopathy if present.  At this point in time I have discontinued the lorazepam but left on her home diazepam   - can go home 04/05/20= after social work and o2 asessment  She and her husband updated  Results for Anne, Rush (MRN [id number]) as of 04/03/2020 13:38  Ref. Range 10/12/2010 20:44 05/12/2015 09:18 05/12/2015 09:33 04/02/2020 18:57  Anti Nuclear Antibody (ANA) Latest Ref Range: NEGATIVE  NEG NEG    Cyclic Citrullin Peptide Ab Latest Ref Range: 0.0 - 5.0 U/mL  <2.0    ds DNA Ab Latest Units: IU/mL  1    RA Latex Turbid. Latest Ref Range: 0.0 - 13.9 IU/mL <10 IU/mL <10  <10.0  Tissue Transglut Ab Latest Ref Range: <6 U/mL  1    ENA SM Ab Ser-aCnc Latest Ref Range: <1.0 NEG AI    <1.0 NEG   Results for Anne, Rush (MRN [id number]) as of 04/03/2020 13:38  Ref. Range 08/27/2010 11:32 10/12/2010 09:33 05/12/2015 09:01 04/02/2020 18:57  Sed Rate Latest Ref Range: 0 - 22 mm/hr 16 16 10 8       OV 06/06/2020  Subjective:  Patient ID: Anne Rush, female , DOB: 07-25-1961 , age 77 y.o. , MRN: [id number] , ADDRESS: [place] [id number]   06/06/2020 -  Chief Complaint  Patient presents with  . Follow-up    breathing is doing well with 2L. c/o non  prod cough.    #Baseline interstitial lung disease in 2012 with progression in July 2021.  High-resolution CT chest pattern with alternative diagnosis suggestive of NSIP but significant mold exposure at workplace leading up to July 2021 although no air-trapping.  Started empiric prednisone mid July 2021.  Reluctant to have surgical lung biopsy or transbronchial biopsy of bronchoscopy lavage  HPI Anne Rush 59 y.o. -returns for follow-up.  Last seen in mid July 2021 in the hospital.  Since then she is seen nurse practitioner once towards end of July 2021.  She reports overall improvement in symptoms but still has some dyspnea.  She says when she is on room air up and about in the house after 45 minutes to 1 hour her pulse ox nadir is around 88 to 92%.  She then uses oxygen to give her some relief.  She does not realize that oxygen is indicated if it is less than 88% only.  She says she is doing well with the prednisone overall although she has gained weight and has developed features of "moon face".  She continues to be hesitant about having any intervention to diagnose underlying ILD.  She is now off work and she is trying to pursue Social Security disability.  We did discuss the fact that if her condition improves she may not qualify for it.  She is not interested in [employer]. because she has good personal relationship with her previous Librarian, academic.  There is an invitation for her to work remotely and she is pondering that.  She does not have social short-term disability through her employer  In terms of vaccination:  She is going to have a flu shot in the month of October 2021 through primary care physician.  She has not had the Covid vaccine.  She is hesitant about the Covid vaccine because of fear of side effects and efficacy.  We discussed this extensively.  She is immunosuppressed because of prednisone.  She is also has interstitial lung disease and obesity and age over 29.   07/23/2020- APP  visit Patient presents today for surgical clearance. She is planning cystoscopy/ureteroscopy and stent exchange on Monday November 8th with Dr. Hollice [facility]. She has no acute respiratory complaints. Her breathing has stayed the same. She goes most days at home without needing to use oxygen. She may need to use 2L 1-2 times a week for a couple of hours. She monitors her levels and she rarely see O2 below 88% RA.  She has not been vaccinated for covid-19, unsure if she will get it. Not currently smoking, quit in 2012. Her weight remains stable since September. She has some lower back and abdominal pain which she takes hydrocodone at bedtime. She is following with ID and urology. Currenty on [place] which she will complete 2 days after surgery. Denies acute symptoms of cough, chest tightness, wheezing. Afebrile.      OV 09/05/2020   Subjective:  Patient ID: Anne Rush, female , DOB: [birth year], age 41 y.o. years. , MRN: [id number],  ADDRESS: [place] [address] PCP  Owens Loffler, MD Providers : Treatment Team:  Attending Provider: Brand Males, MD Patient Care Team: Owens Loffler, MD as PCP - General (Family Medicine) Constance Haw, MD as PCP - Electrophysiology (Cardiology) O'Neal, Cassie Freer, MD as PCP - Cardiology (Cardiology)  Type of  visit: Telephone/Video Circumstance: COVID-19 national emergency Identification of patient Anne Rush with Sep 27, 1960 and MRN [id number] - 2 person identifier Risks: Risks, benefits, limitations of telephone visit explained. Patient understood and verbalized agreement to proceed Anyone else on call: none Patient location: her home This provider location: [place] in [place]   FU ILD.   #Baseline interstitial lung disease in 2012 with progression in July 2021.  High-resolution CT chest pattern with alternative diagnosis suggestive of NSIP but significant mold exposure at workplace leading up to  July 2021 although no air-trapping.  Started empiric prednisone mid July 2021.  Reluctant to have surgical lung biopsy or transbronchial biopsy of bronchoscopy lavage   HPI Anne Rush 59 y.o. -and this telephone visit she wanted to discuss about coming off prednisone.  Since I last saw her she ended up with bacterial sepsis.  She saw a nurse practitioner for urologic clearance.  She has a ureteral stent.  She is feeling fine now.  She does have some fatigue and myalgias post sepsis and post urologic procedure but otherwise is feeling good.  In terms of her shortness of breath.  She tells me that she is definitely worse than baseline compared to preadmission when I first met her.  She has dyspnea on exertion class II-3.  She uses oxygen for subjective relief but does not necessarily check her pulse ox if it goes below 88%.  She believes it goes below 88%.  She is on prednisone now 5 mg Monday Wednesday Friday.  She wants to come off this.  She is here to have Covid vaccine.  She says she is scared about getting Covid vaccine because of her immune system.  I reviewed her latest high-resolution CT chest.  Her groundglass opacities have resolved but she now has chronicity.  This means her CT scan is better than earlier this year when she was in [place] but worse than 2012 when she originally was found to have ILD.  And otherwise she has progressive phenotype of non-- IPF ILD    SYMPTOM SCALE - ILD 04/04/2020  06/06/2020   O2 use RA - 87% in hospital Uses o2 at home but below walk in on RA  Shortness of Breath 0 -> 5 scale with 5 being worst (score 6 If unable to do)   At rest 2 -  Simple tasks - showers, clothes change, eating, shaving 2 3  Household (dishes, doing bed, laundry) 5 3  Shopping 5 x  Walking level at own pace 4 2  Walking up Stairs 5 3.5  Total (30-36) Dyspnea Score 23 11.5  How Rush is your cough? x 1  How Rush is your fatigue x 4  How Rush is nausea x 0  How Rush is vomiting?  x  0  How Rush is diarrhea? x 2  How Rush is anxiety? x 3  How Rush is depression x 1      Simple office walk 185 feet x  3 laps goal with forehead probe 06/06/2020   O2 used ra  Number laps completed 3  Comments about pace normal  Resting Pulse Ox/HR 98% and 101/min  Final Pulse Ox/HR 95% and 118/min  Desaturated </= 88% x  Desaturated <= 3% points c=x  Got Tachycardic >/= 90/min x  Symptoms at end of test x  Miscellaneous comments x     CT Chest data 09/01/20  Narrative & Impression  CLINICAL DATA:  Follow-up interstitial lung disease, on steroid therapy. Dyspnea  with exertion with intermittent oxygen use.  EXAM: CT CHEST WITHOUT CONTRAST  TECHNIQUE: Multidetector CT imaging of the chest was performed following the standard protocol without intravenous contrast. High resolution imaging of the lungs, as well as inspiratory and expiratory imaging, was performed.  COMPARISON:  04/02/2020 chest CT angiogram.  FINDINGS: Cardiovascular: Normal heart size. No significant pericardial effusion/thickening. Two lead left subclavian pacemaker with lead tips in the right atrium and right ventricular apex. Atherosclerotic nonaneurysmal thoracic aorta. Normal caliber pulmonary arteries.  Mediastinum/Nodes: No discrete thyroid nodules. Unremarkable esophagus. No pathologically enlarged axillary, mediastinal or hilar lymph nodes, noting limited sensitivity for the detection of hilar adenopathy on this noncontrast study.  Lungs/Pleura: No pneumothorax. No pleural effusion. No acute consolidative airspace disease or lung masses. Few scattered small solid right pulmonary nodules, largest 4 mm along the minor fissure (series 13/image 69), all stable and considered benign. No new significant pulmonary nodules. No significant lobular air trapping or evidence of tracheobronchomalacia on the expiration sequence. Mild centrilobular and paraseptal emphysema. Patchy  mild-to-moderate peripheral ground-glass opacity and reticulation throughout both lungs with associated minimal architectural distortion. No significant regions traction bronchiectasis. No frank honeycombing. These findings (particularly the previously described peripheral reticulation and perilobular lines) appear mildly improved since 04/03/2020 chest CT. The ground-glass opacities are decreased and the reticulation and architectural distortion are increased since remote 2012 chest CT. Slight upper lung predominance to these findings.  Upper abdomen: Coarse posterior left liver calcification, unchanged.  Musculoskeletal: No aggressive appearing focal osseous lesions. Moderate thoracic spondylosis.  IMPRESSION: 1. Spectrum of findings compatible with fibrotic interstitial lung disease without frank honeycombing, with a slight upper lung predominance. Findings have progressed since remote 2012 chest CT, and appear mildly improved in the interval since 04/03/2020 chest CT. Differential considerations include chronic hypersensitivity pneumonitis or fibrotic NSIP/postinflammatory fibrosis. Findings are suggestive of an alternative diagnosis (not UIP) per consensus guidelines: Diagnosis of Idiopathic Pulmonary Fibrosis: An Official ATS/ERS/JRS/ALAT Clinical Practice Guideline. [place], Iss 5, [phone], May 21 2017. 2. Aortic Atherosclerosis (ICD10-I70.0) and Emphysema (ICD10-J43.9).   Electronically Signed   By: Ilona Sorrel M.D.   On: 09/01/2020 11:04      PFT  PFT Results Latest Ref Rng & Units 09/01/2020 04/04/2020  FVC-Pre L - 2.25  FVC-Predicted Pre % 78 69  Pre FEV1/FVC % % 82 85  FEV1-Pre L 2.08 1.92  FEV1-Predicted Pre % 81 75  DLCO uncorrected ml/min/mmHg 15.24 14.04  DLCO UNC% % 77 71  DLCO corrected ml/min/mmHg - 13.23  DLCO COR %Predicted % - 66  DLVA Predicted % 86 97       has a past medical history of Chronic systolic CHF  (congestive heart failure) ([place]) (07/15/2020), Complete heart block ([place]) (02/26/2016), Family history of ovarian cancer, Generalized anxiety disorder (07/15/2020), GERD (gastroesophageal reflux disease), History of kidney stones, Hyperlipidemia, Hypertension, Interstitial lung disease ([place]), Pollen allergies, Presence of permanent cardiac pacemaker, Squamous cell carcinoma (2014), and Subclinical hyperthyroidism (06/17/2014).   reports that she quit smoking about 9 years ago. She has a 15.00 pack-year smoking history. She has never used smokeless tobacco.  Past Surgical History:  Procedure Laterality Date  . CESAREAN SECTION    . COSMETIC SURGERY     skin cancer face  . CYSTOSCOPY W/ URETERAL STENT PLACEMENT Left 07/15/2020   Procedure: CYSTOSCOPY WITH RETROGRADE PYELOGRAM/URETERAL STENT PLACEMENT;  Surgeon: Abbie Sons, MD;  Location: [facility] ORS;  Service: Urology;  Laterality: Left;  . CYSTOSCOPY/URETEROSCOPY/HOLMIUM LASER/STENT PLACEMENT  Left 07/28/2020   Procedure: CYSTOSCOPY/URETEROSCOPY/HOLMIUM LASER/STENT Exchange;  Surgeon: Hollice Espy, MD;  Location: [facility];  Service: Urology;  Laterality: Left;  . EP IMPLANTABLE DEVICE N/A 02/26/2016   Procedure: Pacemaker Implant;  Surgeon: Will Meredith Leeds, MD;  Location: [facility];  Service: Cardiovascular;  Laterality: N/A;  . TUBAL LIGATION      Allergies  Allergen Reactions  . Codeine Hives and Itching  . Sulfonamide Derivatives Hives  . Paroxetine Other (See Comments)    unknown  . Lisinopril Rash  . Losartan Potassium Other (See Comments)    Leg felt heavy  . Neomycin-Polymyxin-Dexameth Itching, Swelling and Other (See Comments)    Ey drop/Swelling and itching of eye   . Oxycodone-Acetaminophen Other (See Comments)    Mild SOB, feels funny  . Sulfa Antibiotics Hives and Rash         Immunization History  Administered Date(s) Administered  . Influenza Nasal 06/20/2017  . Influenza Whole 07/04/2008, 06/18/2009,  08/27/2010  . Influenza, Seasonal, Injecte, Preservative Fre 09/03/2010, 06/22/2011, 06/27/2012  . Influenza,inj,Quad PF,6+ Mos 07/21/2015, 07/06/2018  . Influenza-Unspecified 06/27/2012, 07/17/2014, 07/19/2016, 07/21/2019  . Td 05/10/2002  . Tdap 10/28/2014    Family History  Problem Relation Age of Onset  . Anxiety disorder Mother   . Hypertension Mother   . Ovarian cancer Mother 43  . Cancer Maternal Aunt        breast  . Heart failure Maternal Grandfather      Current Outpatient Medications:  .  carvedilol (COREG) 25 MG tablet, Take 12.5 mg by mouth 2 (two) times daily with a meal., Disp: , Rfl:  .  diazepam (VALIUM) 5 MG tablet, TAKE ONE TABLET BY MOUTH EVERY 8 HOURS AS NEEDED FOR ANXIETY, Disp: 30 tablet, Rfl: 3 .  famotidine (PEPCID) 20 MG tablet, Take 1 tablet (20 mg total) by mouth daily., Disp: 30 tablet, Rfl: 0 .  predniSONE (DELTASONE) 5 MG tablet, Take $RemoveBefo'5mg'zCxhnZwvdHT$  per day through mid-nov 2021 -> then $Remov'5mg'oojMHD$  Monday, Wed, Friday -> till further notice, Disp: 30 tablet, Rfl: 2 .  spironolactone (ALDACTONE) 25 MG tablet, TAKE ONE TABLET BY MOUTH DAILY, Disp: 90 tablet, Rfl: 0      Objective:   There were no vitals filed for this visit.  Estimated body mass index is 35.07 kg/m as calculated from the following:   Height as of 08/06/20: $RemoveBefo'5\' 3"'OqAsbEyaAHK$  (1.6 m).   Weight as of 08/06/20: 198 lb (89.8 kg).  $Rem'@WEIGHTCHANGE'DjgH$ @  There were no vitals filed for this visit. Discussion only visit on the telephone.  She sounded normal    Assessment:       ICD-10-CM   1. 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  Specific variety not known.  Hypersensitive pneumonitis and NSIP is in differential diagnosis.  She does not want to have biopsy.  I explained to her that likely she had a flareup because of mold exposure earlier in the year.  Now flareup has resolved.  I believe prednisone is no longer helpful and I agree with her that we can taper this off.  However  she seems to have chronicity and temporal progression over 10 years.  Therefore antifibrotic's are indicated.  At this point in time we resolved that I would write a taper for prednisone but see her back in 2-3 months with spirometry and DLCO and at that time talk more in detail about antifibrotic's  Counseled abou covid vaccine -  she is still reluctant.     Plan:     Patient Instructions     ICD-10-CM   1. ILD (interstitial lung disease) ([place])  J84.9   2. Vaccine counseling  Z71.89     Improved overall   Plan  - use o2 only for pulse ox < 88% - continue prednisone 29m per day through mid oct 2021 -> then 510mper day through mid-nov 2021 -> then 78m80monday, Wed, Friday -> till further notice  - HRCT supine and prone early dec 2021  - spirometry and dlco early dec 2021 - flu shot when available - covid vaccine (moderna or pfizer) highly recommended   Followup  - early dec 2021 to discuss clinical course   - sILD symptom score and simple walk test on RA at followup   (Telephone visit - Level 03 visit: Estb 21-30 for this visit type which was visit type: telephone visit in total care time and counseling or/and coordination of care by this undersigned MD - Dr MurBrand Maleshis includes one or more of the following for care delivered on 09/05/2020 same day: pre-charting, chart review, note writing, documentation discussion of test results, diagnostic or treatment recommendations, prognosis, risks and benefits of management options, instructions, education, compliance or risk-factor reduction. It excludes time spent by the [facility] office staff in the care of the patient. Actual time was 22 min. E&M code is [phone] SIGNATURE    Dr. MurBrand Males.D., F.C.C.P,  Pulmonary and Critical Care Medicine Staff Physician, [facility] - Interstitial Lung Disease  Program  Pulmonary FibSteele [place]: [phone] no answer or between  15:00h - 7:00h: call [phone] Telephone: [phone]  1:06 PM 09/05/2020

## 2020-09-05 NOTE — Patient Instructions (Addendum)
ICD-10-CM   1. ILD (interstitial lung disease) (HCC)  J84.9   2. Vaccine counseling  Z71.89    Reduce prednisone to 5 mg on Monday and Friday through rest of December 2021 Then in January 2022   - -do 5 mg on Monday alone from September 20, 2020 through the 15th 2022  -Then do 2 mg on Monday alone from October 05, 2020 through 31st 2022  -And stop   Do spirometry and DLCO in February or March 2022  Follow-up with Dr. Marchelle Gearing for a 30-minute visit in February March 2022 to discuss antifibrotic's  Strongly recommend Covid vaccine

## 2020-09-11 ENCOUNTER — Ambulatory Visit (INDEPENDENT_AMBULATORY_CARE_PROVIDER_SITE_OTHER): Payer: BC Managed Care – PPO

## 2020-09-11 DIAGNOSIS — I442 Atrioventricular block, complete: Secondary | ICD-10-CM

## 2020-09-11 LAB — CUP PACEART REMOTE DEVICE CHECK
Battery Remaining Longevity: 92 mo
Battery Remaining Percentage: 89 %
Battery Voltage: 2.95 V
Brady Statistic AP VP Percent: 2.4 %
Brady Statistic AP VS Percent: 1 %
Brady Statistic AS VP Percent: 98 %
Brady Statistic AS VS Percent: 1 %
Brady Statistic RA Percent Paced: 2.3 %
Brady Statistic RV Percent Paced: 99 %
Date Time Interrogation Session: 20211223141026
Implantable Lead Implant Date: 20170608
Implantable Lead Implant Date: 20170608
Implantable Lead Location: 753859
Implantable Lead Location: 753860
Implantable Pulse Generator Implant Date: 20170608
Lead Channel Impedance Value: 400 Ohm
Lead Channel Impedance Value: 410 Ohm
Lead Channel Pacing Threshold Amplitude: 0.75 V
Lead Channel Pacing Threshold Amplitude: 1 V
Lead Channel Pacing Threshold Pulse Width: 0.4 ms
Lead Channel Pacing Threshold Pulse Width: 0.4 ms
Lead Channel Sensing Intrinsic Amplitude: 12 mV
Lead Channel Sensing Intrinsic Amplitude: 2.5 mV
Lead Channel Setting Pacing Amplitude: 2 V
Lead Channel Setting Pacing Amplitude: 2.5 V
Lead Channel Setting Pacing Pulse Width: 0.4 ms
Lead Channel Setting Sensing Sensitivity: 2.5 mV
Pulse Gen Model: 2272
Pulse Gen Serial Number: 7903881

## 2020-09-25 NOTE — Progress Notes (Signed)
Remote pacemaker transmission.   

## 2020-09-29 ENCOUNTER — Other Ambulatory Visit: Payer: Self-pay | Admitting: Family Medicine

## 2020-10-06 ENCOUNTER — Other Ambulatory Visit: Payer: BC Managed Care – PPO

## 2020-10-07 ENCOUNTER — Other Ambulatory Visit: Payer: Self-pay | Admitting: Family Medicine

## 2020-10-07 DIAGNOSIS — Z79899 Other long term (current) drug therapy: Secondary | ICD-10-CM

## 2020-10-07 DIAGNOSIS — E559 Vitamin D deficiency, unspecified: Secondary | ICD-10-CM

## 2020-10-07 DIAGNOSIS — Z131 Encounter for screening for diabetes mellitus: Secondary | ICD-10-CM

## 2020-10-07 DIAGNOSIS — E785 Hyperlipidemia, unspecified: Secondary | ICD-10-CM

## 2020-10-07 NOTE — Progress Notes (Deleted)
Anne Rush T. Anne Lodato, MD, CAQ Sports Medicine  Primary Care and Sports Medicine [facility] at [facility] [address] [place], [address]  Phone: [phone]  FAX: [phone]  Anne Rush - 60 y.o. female  MRN [id number]  Date of Birth: [birth year]  Date: 10/08/2020  PCP: [name], MD  Referral: [name], MD  No chief complaint on file.   This visit occurred during the SARS-CoV-2 public health emergency.  Safety protocols were in place, including screening questions prior to the visit, additional usage of staff PPE, and extensive cleaning of exam room while observing appropriate contact time as indicated for disinfecting solutions.   Patient Care Team: [name], MD as PCP - General (Family Medicine) Regan Lemming, MD as PCP - Electrophysiology (Cardiology) O'Neal, Ronnald Ramp, MD as PCP - Cardiology (Cardiology) Subjective:   Anne Rush is a 60 y.o. pleasant patient who presents with the following:  Health Maintenance Summary Reviewed and updated, unless pt declines services.  Tobacco History Reviewed. Non-smoker Alcohol: No concerns, no excessive use Exercise Habits: Some activity, rec at least 30 mins 5 times a week STD concerns: none Drug Use: None Lumps or breast concerns: no  She is a well-known patient she has multiple medical problems including complete heart block that has not pacemaker, she also has some interstitial lung disease and is followed by the pulmonary fibrosis clinic.  Medical concerns: COVID-19 vaccine? Colonoscopy Influenza vaccine?    Health Maintenance  Topic Date Due  . Hepatitis C Screening  Never done  . COVID-19 Vaccine (1) Never done  . COLONOSCOPY (Pts 45-20yrs Insurance coverage will need to be confirmed)  Never done  . INFLUENZA VACCINE  04/20/2020  . MAMMOGRAM  02/25/2021 (Originally 04/28/2016)  . PAP SMEAR-Modifier  09/25/2021  . TETANUS/TDAP  10/28/2024  . HIV  Screening  Completed    Immunization History  Administered Date(s) Administered  . Influenza Nasal 06/20/2017  . Influenza Whole 07/04/2008, 06/18/2009, 08/27/2010  . Influenza, Seasonal, Injecte, Preservative Fre 09/03/2010, 06/22/2011, 06/27/2012  . Influenza,inj,Quad PF,6+ Mos 07/21/2015, 07/06/2018  . Influenza-Unspecified 06/27/2012, 07/17/2014, 07/19/2016, 07/21/2019  . Td 05/10/2002  . Tdap 10/28/2014   Patient Active Problem List   Diagnosis Date Noted  . Chronic systolic CHF (congestive heart failure) (HCC) 07/15/2020    Priority: High  . Interstitial lung disease (HCC) 04/18/2020    Priority: High  . Pacemaker 10/01/2018    Priority: High  . Complete heart block (HCC) s/p Pacemaker 02/26/2016    Priority: High  . Hyperlipidemia LDL goal <100 06/21/2007    Priority: Medium  . GAD (generalized anxiety disorder) 06/05/2007    Priority: Medium  . Essential hypertension 06/05/2007    Priority: Medium  . GERD (gastroesophageal reflux disease) 07/15/2020  . Severe sepsis (HCC) 07/15/2020  . Diastolic dysfunction 04/02/2020  . Vitamin D deficiency 01/11/2018  . Allergic angioedema 03/17/2016  . Former tobacco use 08/28/2008  . ALLERGIC RHINITIS 06/05/2007    Past Medical History:  Diagnosis Date  . Chronic systolic CHF (congestive heart failure) (HCC) 07/15/2020  . Complete heart block (HCC) 02/26/2016   s/p Pacemaker  . Family history of ovarian cancer   . Generalized anxiety disorder 07/15/2020  . GERD (gastroesophageal reflux disease)   . History of kidney stones   . Hyperlipidemia   . Hypertension   . Interstitial lung disease (HCC)   . Pollen allergies   . Presence of permanent cardiac pacemaker   . Squamous cell carcinoma  2014   skin cancer  . Subclinical hyperthyroidism 06/17/2014    Past Surgical History:  Procedure Laterality Date  . CESAREAN SECTION    . COSMETIC SURGERY     skin cancer face  . CYSTOSCOPY W/ URETERAL STENT PLACEMENT Left  07/15/2020   Procedure: CYSTOSCOPY WITH RETROGRADE PYELOGRAM/URETERAL STENT PLACEMENT;  Surgeon: Riki Altes, MD;  Location: [facility];  Service: Urology;  Laterality: Left;  . CYSTOSCOPY/URETEROSCOPY/HOLMIUM LASER/STENT PLACEMENT Left 07/28/2020   Procedure: CYSTOSCOPY/URETEROSCOPY/HOLMIUM LASER/STENT Exchange;  Surgeon: Vanna Scotland, MD;  Location: [facility];  Service: Urology;  Laterality: Left;  . EP IMPLANTABLE DEVICE N/A 02/26/2016   Procedure: Pacemaker Implant;  Surgeon: Will Jorja Loa, MD;  [facility];  Service: Cardiovascular;  Laterality: N/A;  . TUBAL LIGATION      Family History  Problem Relation Age of Onset  . Anxiety disorder Mother   . Hypertension Mother   . Ovarian cancer Mother 68  . Cancer Maternal Aunt        breast  . Heart failure Maternal Grandfather     Past Medical History, Surgical History, Social History, Family History, Problem List, Medications, and Allergies have been reviewed and updated if relevant.  Review of Systems: Pertinent positives are listed above.  Otherwise, a full 14 point review of systems has been done in full and it is negative except where it is noted positive.  Objective:   There were no vitals taken for this visit. Ideal Body Weight:   No exam data present Depression screen [facility] 2/9 04/16/2019 04/12/2018 05/17/2017 12/13/2016  Decreased Interest 0 0 0 0  Down, Depressed, Hopeless 0 0 0 0  PHQ - 2 Score 0 0 0 0     GEN: well developed, well nourished, no acute distress Eyes: conjunctiva and lids normal, PERRLA, EOMI ENT: TM clear, nares clear, oral exam WNL Neck: supple, no lymphadenopathy, no thyromegaly, no JVD Pulm: clear to auscultation and percussion, respiratory effort normal CV: regular rate and rhythm, S1-S2, no murmur, rub or gallop, no bruits Chest: no scars, masses, no lumps BREAST: breast exam declined GI: soft, non-tender; no hepatosplenomegaly, masses; active bowel sounds all quadrants GU:  GU exam declined Lymph: no cervical, axillary or inguinal adenopathy MSK: gait normal, muscle tone and strength WNL, no joint swelling, effusions, discoloration, crepitus  SKIN: clear, good turgor, color WNL, no rashes, lesions, or ulcerations Neuro: normal mental status, normal strength, sensation, and motion Psych: alert; oriented to person, place and time, normally interactive and not anxious or depressed in appearance.   All labs reviewed with patient. Results for orders placed or performed in visit on 09/11/20  CUP PACEART REMOTE DEVICE CHECK  Result Value Ref Range   Date Time Interrogation Session [phone]    Pulse Generator Manufacturer [employer]    Pulse Gen Model 2272 Assurity MRI    Pulse Gen Serial Number [id number]    Clinic Name [facility]    Implantable Pulse Generator Type Implantable Pulse Generator    Implantable Pulse Generator Implant Date [id number]    Implantable Lead Manufacturer [facility]    Implantable Lead Model [id number] Tendril MRI    Implantable Lead Serial Number [id number]    Implantable Lead Implant Date [id number]    Implantable Lead Location Detail 1 UNKNOWN    Implantable Lead Location [id number]    Implantable Lead Manufacturer [facility]    Implantable Lead Model [id number] Tendril MRI    Implantable Lead Serial Number [id number]    Implantable Lead Implant Date  [id number]    Implantable Lead Location Detail 1 UNKNOWN    Implantable Lead Location [phone]    Lead Channel Setting Sensing Sensitivity 2.5 mV   Lead Channel Setting Sensing Adaptation Mode Fixed Pacing    Lead Channel Setting Pacing Amplitude 2.0 V   Lead Channel Setting Pacing Pulse Width 0.4 ms   Lead Channel Setting Pacing Amplitude 2.5 V   Lead Channel Status NULL    Lead Channel Impedance Value 400 ohm   Lead Channel Sensing Intrinsic Amplitude 2.5 mV   Lead Channel Pacing Threshold Amplitude 0.75 V   Lead Channel Pacing Threshold Pulse Width 0.4 ms   Lead Channel Status NULL    Lead Channel  Impedance Value 410 ohm   Lead Channel Sensing Intrinsic Amplitude 12.0 mV   Lead Channel Pacing Threshold Amplitude 1.0 V   Lead Channel Pacing Threshold Pulse Width 0.4 ms   Battery Status MOS    Battery Remaining Longevity 92 mo   Battery Remaining Percentage 89.0 %   Battery Voltage 2.95 V   Brady Statistic RA Percent Paced 2.3 %   Brady Statistic RV Percent Paced 99.0 %   Brady Statistic AP VP Percent 2.4 %   Brady Statistic AS VP Percent 98.0 %   Brady Statistic AP VS Percent 1.0 %   Brady Statistic AS VS Percent 1.0 %   CUP PACEART REMOTE DEVICE CHECK  Result Date: 09/11/2020 Scheduled remote reviewed. Normal device function.  Next remote 91 days. Hassell Halim, RN, CCDS, [employer]   Assessment and Plan:     ICD-10-CM   1. Healthcare maintenance  Z00.00     Health Maintenance Exam: The patient's preventative maintenance and recommended screening tests for an annual wellness exam were reviewed in full today. Brought up to date unless services declined.  Counselled on the importance of diet, exercise, and its role in overall health and mortality. The patient's FH and SH was reviewed, including their home life, tobacco status, and drug and alcohol status.  Follow-up in 1 year for physical exam or additional follow-up below.  Follow-up: No follow-ups on file. Or follow-up in 1 year if not noted.  Future Appointments  Date Time Provider Department Center  10/08/2020  8:20 AM [name], MD [facility]  12/11/2020  7:45 AM [facility] DEVICE REMOTES [facility]  03/03/2021  3:15 PM Vanna Scotland, MD [facility] None  03/12/2021  7:45 AM [facility] DEVICE REMOTES [facility]  06/11/2021  7:45 AM [facility] DEVICE REMOTES [facility]  09/10/2021  7:45 AM [facility] DEVICE REMOTES [facility]  12/10/2021  7:45 AM [facility] DEVICE REMOTES [facility]    No orders of the defined types were  placed in this encounter.  There are no discontinued medications. No orders of the defined types were placed in this encounter.   Signed,  Elpidio Galea. Iveth Heidemann, MD   Allergies as of 10/08/2020      Reactions   Codeine Hives, Itching   Sulfonamide Derivatives Hives   Paroxetine Other (See Comments)   unknown   Lisinopril Rash   Losartan Potassium Other (See Comments)   Leg felt heavy   Neomycin-polymyxin-dexameth Itching, Swelling, Other (See Comments)   Ey drop/Swelling and itching of eye    Oxycodone-acetaminophen Other (See Comments)   Mild SOB, feels funny   Sulfa Antibiotics Hives, Rash         Medication List       Accurate as of October 07, 2020  3:21 PM. If you  have any questions, ask your nurse or doctor.        carvedilol 25 MG tablet Commonly known as: COREG Take 12.5 mg by mouth 2 (two) times daily with a meal.   diazepam 5 MG tablet Commonly known as: VALIUM TAKE ONE TABLET BY MOUTH EVERY 8 HOURS AS NEEDED FOR ANXIETY   famotidine 20 MG tablet Commonly known as: PEPCID Take 1 tablet (20 mg total) by mouth daily.   predniSONE 5 MG tablet Commonly known as: DELTASONE Take 5mg  per day through mid-nov 2021 -> then 5mg  Monday, Wed, Friday -> till further notice   spironolactone 25 MG tablet Commonly known as: ALDACTONE TAKE ONE TABLET BY MOUTH DAILY

## 2020-10-08 ENCOUNTER — Encounter: Payer: BC Managed Care – PPO | Admitting: Family Medicine

## 2020-10-13 ENCOUNTER — Other Ambulatory Visit: Payer: Self-pay

## 2020-10-13 ENCOUNTER — Other Ambulatory Visit (INDEPENDENT_AMBULATORY_CARE_PROVIDER_SITE_OTHER): Payer: BC Managed Care – PPO

## 2020-10-13 DIAGNOSIS — E785 Hyperlipidemia, unspecified: Secondary | ICD-10-CM | POA: Diagnosis not present

## 2020-10-13 DIAGNOSIS — E559 Vitamin D deficiency, unspecified: Secondary | ICD-10-CM | POA: Diagnosis not present

## 2020-10-13 DIAGNOSIS — Z79899 Other long term (current) drug therapy: Secondary | ICD-10-CM

## 2020-10-13 DIAGNOSIS — Z131 Encounter for screening for diabetes mellitus: Secondary | ICD-10-CM

## 2020-10-13 LAB — CBC WITH DIFFERENTIAL/PLATELET
Basophils Absolute: 0 10*3/uL (ref 0.0–0.1)
Basophils Relative: 0.7 % (ref 0.0–3.0)
Eosinophils Absolute: 0.1 10*3/uL (ref 0.0–0.7)
Eosinophils Relative: 1.3 % (ref 0.0–5.0)
HCT: 41.4 % (ref 36.0–46.0)
Hemoglobin: 14.3 g/dL (ref 12.0–15.0)
Lymphocytes Relative: 29.3 % (ref 12.0–46.0)
Lymphs Abs: 1.8 10*3/uL (ref 0.7–4.0)
MCHC: 34.5 g/dL (ref 30.0–36.0)
MCV: 89.4 fl (ref 78.0–100.0)
Monocytes Absolute: 0.5 10*3/uL (ref 0.1–1.0)
Monocytes Relative: 9.1 % (ref 3.0–12.0)
Neutro Abs: 3.6 10*3/uL (ref 1.4–7.7)
Neutrophils Relative %: 59.6 % (ref 43.0–77.0)
Platelets: 225 10*3/uL (ref 150.0–400.0)
RBC: 4.63 Mil/uL (ref 3.87–5.11)
RDW: 12.5 % (ref 11.5–15.5)
WBC: 6 10*3/uL (ref 4.0–10.5)

## 2020-10-13 LAB — LIPID PANEL
Cholesterol: 228 mg/dL — ABNORMAL HIGH (ref 0–200)
HDL: 48.4 mg/dL (ref >39.00)
NonHDL: 179.24
Total CHOL/HDL Ratio: 5
Triglycerides: 285 mg/dL — ABNORMAL HIGH (ref 0.0–149.0)
VLDL: 57 mg/dL — ABNORMAL HIGH (ref 0.0–40.0)

## 2020-10-13 LAB — HEPATIC FUNCTION PANEL
ALT: 11 U/L (ref 0–35)
AST: 15 U/L (ref 0–37)
Albumin: 4.7 g/dL (ref 3.5–5.2)
Alkaline Phosphatase: 51 U/L (ref 39–117)
Bilirubin, Direct: 0.1 mg/dL (ref 0.0–0.3)
Total Bilirubin: 0.5 mg/dL (ref 0.2–1.2)
Total Protein: 7.3 g/dL (ref 6.0–8.3)

## 2020-10-13 LAB — BASIC METABOLIC PANEL
BUN: 10 mg/dL (ref 6–23)
CO2: 30 mEq/L (ref 19–32)
Calcium: 9.8 mg/dL (ref 8.4–10.5)
Chloride: 101 mEq/L (ref 96–112)
Creatinine, Ser: 0.6 mg/dL (ref 0.40–1.20)
GFR: 99.34 mL/min (ref >60.00)
Glucose, Bld: 95 mg/dL (ref 70–99)
Potassium: 4.3 mEq/L (ref 3.5–5.1)
Sodium: 138 mEq/L (ref 135–145)

## 2020-10-13 LAB — VITAMIN D 25 HYDROXY (VIT D DEFICIENCY, FRACTURES): VITD: 25.8 ng/mL — ABNORMAL LOW (ref 30.00–100.00)

## 2020-10-13 LAB — HEMOGLOBIN A1C: Hgb A1c MFr Bld: 5.8 % (ref 4.6–6.5)

## 2020-10-13 LAB — LDL CHOLESTEROL, DIRECT: Direct LDL: 159 mg/dL

## 2020-10-13 LAB — TSH: TSH: 2.71 u[IU]/mL (ref 0.35–4.50)

## 2020-10-15 NOTE — Progress Notes (Signed)
Cavan Bearden T. Alexx Mcburney, MD, CAQ Sports Medicine  Primary Care and Sports Medicine [facility] at [facility] [address] [place], [address]  Phone: [phone]   FAX: [phone]  Anne Rush - 60 y.o. female   MRN [id number]   Date of Birth: 14-Jul-1961  Date: 10/16/2020   PCP: [name], MD   Referral: [name], MD  Chief Complaint  Patient presents with   Annual Exam    This visit occurred during the SARS-CoV-2 public health emergency.  Safety protocols were in place, including screening questions prior to the visit, additional usage of staff PPE, and extensive cleaning of exam room while observing appropriate contact time as indicated for disinfecting solutions.   Patient Care Team: [name], MD as PCP - General (Family Medicine) Regan Lemming, MD as PCP - Electrophysiology (Cardiology) O'Neal, Ronnald Ramp, MD as PCP - Cardiology (Cardiology) Subjective:   Anne Rush is a 60 y.o. pleasant patient who presents with the following:  Health Maintenance Summary Reviewed and updated, unless pt declines services.  Tobacco History Reviewed. Non-smoker Alcohol: No concerns, no excessive use Exercise Habits: Not able to do much STD concerns: none Drug Use: None Lumps or breast concerns: no  Question: COVID-19 vaccine Colonoscopy Influenza vaccine  Primary Cards:  O'Neal No statins - her choice  Dr. Dalbert Mayotte - Pulm fibrosis Pred wean ? Now more pred, f/u PFT Does not want to take anti-fibrotic meds Not wearing O2 at night  Cologuard?  Concoction: she has a self-made herbal concoction that she believes will decrease her risk of Covid. She does neti pot Honey on toast    Health Maintenance  Topic Date Due   Hepatitis C Screening  Never done   COVID-19 Vaccine (1) Never done   COLONOSCOPY (Pts 45-69yrs Insurance coverage will need to be confirmed)  Never done   INFLUENZA VACCINE  04/20/2020    MAMMOGRAM  02/25/2021 (Originally 04/28/2016)   PAP SMEAR-Modifier  09/25/2021   TETANUS/TDAP  10/28/2024   HIV Screening  Completed    Immunization History  Administered Date(s) Administered   Influenza Nasal 06/20/2017   Influenza Whole 07/04/2008, 06/18/2009, 08/27/2010   Influenza, Seasonal, Injecte, Preservative Fre 09/03/2010, 06/22/2011, 06/27/2012   Influenza,inj,Quad PF,6+ Mos 07/21/2015, 07/06/2018   Influenza-Unspecified 06/27/2012, 07/17/2014, 07/19/2016, 07/21/2019   Td 05/10/2002   Tdap 10/28/2014   Patient Active Problem List   Diagnosis Date Noted   Chronic systolic CHF (congestive heart failure) (HCC) 07/15/2020    Priority: High   Interstitial lung disease (HCC) 04/18/2020    Priority: High   Pacemaker 10/01/2018    Priority: High   Complete heart block (HCC) s/p Pacemaker 02/26/2016    Priority: High   Hyperlipidemia LDL goal <100 06/21/2007    Priority: Medium   GAD (generalized anxiety disorder) 06/05/2007    Priority: Medium   Essential hypertension 06/05/2007    Priority: Medium   GERD (gastroesophageal reflux disease) 07/15/2020   Severe sepsis (HCC) 07/15/2020   Diastolic dysfunction 04/02/2020   Vitamin D deficiency 01/11/2018   Allergic angioedema 03/17/2016   Former tobacco use 08/28/2008   ALLERGIC RHINITIS 06/05/2007    Past Medical History:  Diagnosis Date   Chronic systolic CHF (congestive heart failure) (HCC) 07/15/2020   Complete heart block (HCC) 02/26/2016   s/p Pacemaker   Family history of ovarian cancer    Generalized anxiety disorder 07/15/2020   GERD (gastroesophageal reflux disease)    History of kidney stones  Hyperlipidemia    Hypertension    Interstitial lung disease (HCC)    Pollen allergies    Presence of permanent cardiac pacemaker    Squamous cell carcinoma 2014   skin cancer   Subclinical hyperthyroidism 06/17/2014    Past Surgical History:  Procedure Laterality Date    CESAREAN SECTION     COSMETIC SURGERY     skin cancer face   CYSTOSCOPY W/ URETERAL STENT PLACEMENT Left 07/15/2020   Procedure: CYSTOSCOPY WITH RETROGRADE PYELOGRAM/URETERAL STENT PLACEMENT;  Surgeon: Riki Altes, MD;  Location: [facility] ORS;  Service: Urology;  Laterality: Left;   CYSTOSCOPY/URETEROSCOPY/HOLMIUM LASER/STENT PLACEMENT Left 07/28/2020   Procedure: CYSTOSCOPY/URETEROSCOPY/HOLMIUM LASER/STENT Exchange;  Surgeon: Vanna Scotland, MD;  Location: [facility] OR[facility];  Service: Urology;  Laterality: Left;   EP IMPLANTABLE DEVICE N/A 02/26/2016   Procedure: Pacemaker Implant;  Surgeon: Will Jorja Loa, MD;  [facility]: [facility];  Service: Cardiovascular;  Laterality: N/A;   TUBAL LIGATION      Family History  Problem Relation Age of Onset   Anxiety disorder Mother    Hypertension Mother    Ovarian cancer Mother 67   Cancer Maternal Aunt        breast   Heart failure Maternal Grandfather     Past Medical History, Surgical History, Social History, Family History, Problem List, Medications, and Allergies have been reviewed and updated if relevant.  Review of Systems: Pertinent positives are listed above.  Otherwise, a full 14 point review of systems has been done in full and it is negative except where it is noted positive.  Objective:   BP 124/82    Pulse 78    Temp (!) 97.3 F (36.3 C) (Temporal)    Ht 5' 2.5" (1.588 m)    Wt 201 lb 12 oz (91.5 kg)    SpO2 94%    BMI 36.31 kg/m  Ideal Body Weight: Weight in (lb) to have BMI = 25: 138.6 No exam data present Depression screen [facility] 2/9 10/16/2020 04/16/2019 04/12/2018 05/17/2017 12/13/2016  Decreased Interest 0 0 0 0 0  Down, Depressed, Hopeless 0 0 0 0 0  PHQ - 2 Score 0 0 0 0 0     GEN: well developed, well nourished, no acute distress Eyes: conjunctiva and lids normal, PERRLA, EOMI ENT: TM clear, nares clear, oral exam WNL Neck: supple, no lymphadenopathy, no thyromegaly, no JVD Pulm: clear to auscultation and  percussion, respiratory effort normal CV: regular rate and rhythm, S1-S2, no murmur, rub or gallop, no bruits Chest: no scars, masses, no lumps BREAST: breast exam declined GI: soft, non-tender; no hepatosplenomegaly, masses; active bowel sounds all quadrants GU: GU exam declined Lymph: no cervical, axillary or inguinal adenopathy MSK: gait normal, muscle tone and strength WNL, no joint swelling, effusions, discoloration, crepitus  SKIN: clear, good turgor, color WNL, no rashes, lesions, or ulcerations Neuro: normal mental status, normal strength, sensation, and motion Psych: alert; oriented to person, place and time, normally interactive and not anxious or depressed in appearance.   All labs reviewed with patient. Results for orders placed or performed in visit on 10/13/20  VITAMIN D 25 Hydroxy (Vit-D Deficiency, Fractures)  Result Value Ref Range   VITD 25.80 (L) 30.00 - 100.00 ng/mL  CBC with Differential/Platelet  Result Value Ref Range   WBC 6.0 4.0 - 10.5 K/uL   RBC 4.63 3.87 - 5.11 Mil/uL   Hemoglobin 14.3 12.0 - 15.0 g/dL   HCT 53.9 76.7 - 34.1 %   MCV  89.4 78.0 - 100.0 fl   MCHC 34.5 30.0 - 36.0 g/dL   RDW 51.8 84.1 - 66.0 %   Platelets 225.0 150.0 - 400.0 K/uL   Neutrophils Relative % 59.6 43.0 - 77.0 %   Lymphocytes Relative 29.3 12.0 - 46.0 %   Monocytes Relative 9.1 3.0 - 12.0 %   Eosinophils Relative 1.3 0.0 - 5.0 %   Basophils Relative 0.7 0.0 - 3.0 %   Neutro Abs 3.6 1.4 - 7.7 K/uL   Lymphs Abs 1.8 0.7 - 4.0 K/uL   Monocytes Absolute 0.5 0.1 - 1.0 K/uL   Eosinophils Absolute 0.1 0.0 - 0.7 K/uL   Basophils Absolute 0.0 0.0 - 0.1 K/uL  Basic metabolic panel  Result Value Ref Range   Sodium 138 135 - 145 mEq/L   Potassium 4.3 3.5 - 5.1 mEq/L   Chloride 101 96 - 112 mEq/L   CO2 30 19 - 32 mEq/L   Glucose, Bld 95 70 - 99 mg/dL   BUN 10 6 - 23 mg/dL   Creatinine, Ser 6.30 0.40 - 1.20 mg/dL   GFR 16.01 >09.32 mL/min   Calcium 9.8 8.4 - 10.5 mg/dL  Hepatic  function panel  Result Value Ref Range   Total Bilirubin 0.5 0.2 - 1.2 mg/dL   Bilirubin, Direct 0.1 0.0 - 0.3 mg/dL   Alkaline Phosphatase 51 39 - 117 U/L   AST 15 0 - 37 U/L   ALT 11 0 - 35 U/L   Total Protein 7.3 6.0 - 8.3 g/dL   Albumin 4.7 3.5 - 5.2 g/dL  TSH  Result Value Ref Range   TSH 2.71 0.35 - 4.50 uIU/mL  Hemoglobin A1c  Result Value Ref Range   Hgb A1c MFr Bld 5.8 4.6 - 6.5 %  Lipid panel  Result Value Ref Range   Cholesterol 228 (H) 0 - 200 mg/dL   Triglycerides 355.7 (H) 0.0 - 149.0 mg/dL   HDL 32.20 >25.42 mg/dL   VLDL 70.6 (H) 0.0 - 23.7 mg/dL   Total CHOL/HDL Ratio 5    NonHDL 179.24   LDL cholesterol, direct  Result Value Ref Range   Direct LDL 159.0 mg/dL   No results found.  Assessment and Plan:     ICD-10-CM   1. Healthcare maintenance  Z00.00    She has a very significant h/o of ILD, CHF, Complete heart block s/p pacemaker.  I did my best to offer guidance and encouragement, but she declines a COVID and Flu vaccine. I was blunt and clear that her risk of death is high from COVID.  I did my best to suggest that following Dr. Fidela Salisbury recommendations are far and away are the best course of action for her pulmonary fibrosis. She is open to Cologuard.  I encouraged her to resume Vit D, 2,000 units daily.  She is limited physically in terms of activity due to pulmonary fibrosis.  Health Maintenance Exam: The patient's preventative maintenance and recommended screening tests for an annual wellness exam were reviewed in full today. Brought up to date unless services declined.  Counselled on the importance of diet, exercise, and its role in overall health and mortality. The patient's FH and SH was reviewed, including their home life, tobacco status, and drug and alcohol status.  Follow-up in 1 year for physical exam or additional follow-up below.  Follow-up: No follow-ups on file. Or follow-up in 1 year if not noted.  Future Appointments   Date Time Provider Department Center  12/11/2020  7:45 AM [facility] DEVICE REMOTES [facility]  03/03/2021  3:15 PM Vanna Scotland, MD [facility] None  03/12/2021  7:45 AM [facility] DEVICE REMOTES [facility]  06/11/2021  7:45 AM [facility] DEVICE REMOTES [facility]  09/10/2021  7:45 AM [facility] DEVICE REMOTES [facility]  12/10/2021  7:45 AM [facility] DEVICE REMOTES [facility]    No orders of the defined types were placed in this encounter.  Medications Discontinued During This Encounter  Medication Reason   predniSONE (DELTASONE) 5 MG tablet Completed Course   No orders of the defined types were placed in this encounter.   Signed,  Elpidio Galea. Charlie Char, MD   Allergies as of 10/16/2020      Reactions   Codeine Hives, Itching   Sulfonamide Derivatives Hives   Paroxetine Other (See Comments)   unknown   Lisinopril Rash   Losartan Potassium Other (See Comments)   Leg felt heavy   Neomycin-polymyxin-dexameth Itching, Swelling, Other (See Comments)   Ey drop/Swelling and itching of eye    Oxycodone-acetaminophen Other (See Comments)   Mild SOB, feels funny   Sulfa Antibiotics Hives, Rash         Medication List       Accurate as of October 16, 2020  8:19 AM. If you have any questions, ask your nurse or doctor.        STOP taking these medications   predniSONE 5 MG tablet Commonly known as: DELTASONE Stopped by: [name], MD     TAKE these medications   carvedilol 25 MG tablet Commonly known as: COREG Take 25 mg by mouth 2 (two) times daily with a meal.   diazepam 5 MG tablet Commonly known as: VALIUM TAKE ONE TABLET BY MOUTH EVERY 8 HOURS AS NEEDED FOR ANXIETY   famotidine 20 MG tablet Commonly known as: PEPCID Take 1 tablet (20 mg total) by mouth daily.   spironolactone 25 MG tablet Commonly known as: ALDACTONE TAKE ONE TABLET BY MOUTH DAILY

## 2020-10-16 ENCOUNTER — Encounter: Payer: Self-pay | Admitting: Family Medicine

## 2020-10-16 ENCOUNTER — Ambulatory Visit (INDEPENDENT_AMBULATORY_CARE_PROVIDER_SITE_OTHER): Payer: BC Managed Care – PPO | Admitting: Family Medicine

## 2020-10-16 ENCOUNTER — Other Ambulatory Visit: Payer: Self-pay

## 2020-10-16 VITALS — BP 124/82 | HR 78 | Temp 97.3°F | Ht 62.5 in | Wt 201.8 lb

## 2020-10-16 DIAGNOSIS — Z0000 Encounter for general adult medical examination without abnormal findings: Secondary | ICD-10-CM

## 2020-10-20 ENCOUNTER — Telehealth: Payer: Self-pay | Admitting: Internal Medicine

## 2020-10-20 NOTE — Telephone Encounter (Signed)
Synetta Fail, please advise.  Patient would like to schedule PFT. (DLCO AND SPIRO ONLY)

## 2020-10-20 NOTE — Telephone Encounter (Signed)
I have spoke with Anne Rush and she was wanting to schedule her PFT for March and I explained to her that they have only given me 4 dates for February. She can't schedule anything for February it would have to be in the later part of March.  I told her a soon as they gave me dates for March I would call her back

## 2020-11-03 DIAGNOSIS — Z1211 Encounter for screening for malignant neoplasm of colon: Secondary | ICD-10-CM | POA: Diagnosis not present

## 2020-11-03 DIAGNOSIS — Z1212 Encounter for screening for malignant neoplasm of rectum: Secondary | ICD-10-CM | POA: Diagnosis not present

## 2020-11-03 LAB — COLOGUARD: Cologuard: NEGATIVE

## 2020-11-05 NOTE — Telephone Encounter (Signed)
I have spoken with Anne Rush and her Covid test has been scheduled on 12/12/20 and PFT has been scheduled on 12/15/20 @ 1:00pm She is aware of the dates and times

## 2020-11-06 DIAGNOSIS — I5189 Other ill-defined heart diseases: Secondary | ICD-10-CM | POA: Diagnosis not present

## 2020-11-06 DIAGNOSIS — J9601 Acute respiratory failure with hypoxia: Secondary | ICD-10-CM | POA: Diagnosis not present

## 2020-11-08 LAB — COLOGUARD: COLOGUARD: NEGATIVE

## 2020-11-11 ENCOUNTER — Encounter: Payer: Self-pay | Admitting: Family Medicine

## 2020-11-14 MED ORDER — DOXYCYCLINE HYCLATE 100 MG PO TABS: 100.0000 mg | ORAL_TABLET | Freq: Two times a day (BID) | ORAL | 0 refills | Status: DC

## 2020-11-14 NOTE — Telephone Encounter (Signed)
Beth, please advise. You seen patient 07/23/2020

## 2020-11-14 NOTE — Telephone Encounter (Signed)
Spoke to patient via telephone.  MR patient last seen 09/05/2020 for ILD. No pending appt.   She reports of non prod cough, bilateral ear pain, throat pain, head/nasal congestion and chills. Sx developed yesterday. Temp of 98.7 this morning. She normally runs around 97.5. Sob is baseline.  She wears 2L PRN. spo2 is maintaining 94-97% on roomair.  She has not had covid vaccines or flu shot.  She was recently around her grandchildren who were sick.   Taking muxinex once daily and tylenol PRN.   She does not have a resue inhaler.   TP please advise, as MR is unavailable. Thanks

## 2020-11-14 NOTE — Telephone Encounter (Signed)
I sent in RX for doxycycline 1 tab twice daily x 7 days. Take with food and wear sunscreen if in direct sunlight. If no improvement or symptoms worse needs covid testing, CXR and office visit.

## 2020-11-19 ENCOUNTER — Ambulatory Visit (INDEPENDENT_AMBULATORY_CARE_PROVIDER_SITE_OTHER): Payer: BC Managed Care – PPO | Admitting: Primary Care

## 2020-11-19 ENCOUNTER — Encounter: Payer: Self-pay | Admitting: Primary Care

## 2020-11-19 ENCOUNTER — Other Ambulatory Visit: Payer: Self-pay

## 2020-11-19 ENCOUNTER — Ambulatory Visit: Payer: BC Managed Care – PPO | Admitting: Primary Care

## 2020-11-19 DIAGNOSIS — J209 Acute bronchitis, unspecified: Secondary | ICD-10-CM

## 2020-11-19 MED ORDER — DOXYCYCLINE HYCLATE 100 MG PO TABS: 100.0000 mg | ORAL_TABLET | Freq: Two times a day (BID) | ORAL | 0 refills | Status: DC

## 2020-11-19 MED ORDER — ALBUTEROL SULFATE HFA 108 (90 BASE) MCG/ACT IN AERS: 1.0000 | INHALATION_SPRAY | Freq: Four times a day (QID) | RESPIRATORY_TRACT | 2 refills | Status: DC | PRN

## 2020-11-19 NOTE — Telephone Encounter (Signed)
Called , no answer .  Needs ov for evaluation.  Was recently called in antibiotics 1 week ago. We have some openings for either in person or telemedicine visits this afternoon if she is a sick visit, which it appears she is and has not been vaccinated for COVID-19 would recommend a telemedicine visit.  Please contact office for sooner follow up if symptoms do not improve or worsen or seek emergency care

## 2020-11-19 NOTE — Progress Notes (Signed)
Virtual Visit via Telephone Note  I connected with Anne Rush on 11/19/20 at  2:30 PM EST by telephone and verified that I am speaking with the correct person using two identifiers.  Location: Patient: Home Provider: Office   I discussed the limitations, risks, security and privacy concerns of performing an evaluation and management service by telephone and the availability of in person appointments. I also discussed with the patient that there may be a patient responsible charge related to this service. The patient expressed understanding and agreed to proceed.   History of Present Illness: 60 year old female, former smoker. PMH significant for ILD, allergic rhinitis, acute respiratory failure, GERD, chronic systolic heart failure, complete heart block, HTN,  Obesity. Patient of Dr. Chase Caller, last seen on 09/05/20. Diagnosed with ILD in 2012, previous smoker. Significant mold exposure 4 years ago. New oxygen requirement. HRCT showed alternative pattern to UIP but no clear description of air trapping. She may have had RB-ILD in hte past or NSIP based on CT imaging. Serologies are negative. She does not want any biopsies or invasive procedure. Clinically this is not UIP/IPF and there is no evidence of autoimmune ILD. Baseline features overall could be NSIP vs hypersensitivity pneumonitis or smoking related   Previous [facility] pulmonary encounters: 09/05/20- Dr. Louann Liv J Rubin 60 y.o. -and this telephone visit she wanted to discuss about coming off prednisone.  Since I last saw her she ended up with bacterial sepsis.  She saw a nurse practitioner for urologic clearance.  She has a ureteral stent.  She is feeling fine now.  She does have some fatigue and myalgias post sepsis and post urologic procedure but otherwise is feeling good.  In terms of her shortness of breath.  She tells me that she is definitely worse than baseline compared to preadmission when I first met her.  She has dyspnea on  exertion class II-3.  She uses oxygen for subjective relief but does not necessarily check her pulse ox if it goes below 88%.  She believes it goes below 88%.  She is on prednisone now 5 mg Monday Wednesday Friday.  She wants to come off this.  She is here to have Covid vaccine.  She says she is scared about getting Covid vaccine because of her immune system.  I reviewed her latest high-resolution CT chest.  Her groundglass opacities have resolved but she now has chronicity.  This means her CT scan is better than earlier this year when she was in [place] but worse than 2012 when she originally was found to have ILD.  And otherwise she has progressive phenotype of non-- IPF ILD  11/19/2020 - Interim hx  Patient contacted today for acute visit. She reports productive cough x 6 days with purulent mucus. She has not had bronchitis symptoms in awhile. She does respond well to Doxycycline but states that she typically needs 14 days of abx. She has not significant shortness of breath. Intermittent chest tightness. Cough at night keeps her up. She has not taken anything over the counter. Unable to use codeine products. She has been using nasal rinses and getting up moderate amount of mucus. Denies f/c/s, sob, wheezing. She is not vaccinated for covid and has not received influenza vaccine this year.    Observations/Objective:  - Able to speak in full sentences without over shortness of breath; some audible nasal congestion   Assessment and Plan:  ILD exacerbation/acute bronchitis: - Slowly improving. Continues to have productive cough/nasal congestion  with purulent mucus. Typically requires 14 days of antibiotic treatment. Extending Doxycycline 123m BID additional 7 days. Sending in RX albuterol hfa 2 puffs every 6 hours for sob/chest tightness. Recommending Delsym twice a day. If not better needs covid testing and imaging.   Follow Up Instructions:  - As needed if symptoms do not improve or worsen    I  discussed the assessment and treatment plan with the patient. The patient was provided an opportunity to ask questions and all were answered. The patient agreed with the plan and demonstrated an understanding of the instructions.   The patient was advised to call back or seek an in-person evaluation if the symptoms worsen or if the condition fails to improve as anticipated.  I provided 22 minutes of non-face-to-face time during this encounter.   EMartyn Ehrich NP

## 2020-11-19 NOTE — Telephone Encounter (Signed)
Called pt to get her scheduled for a televisit.  Pt did a televisit with EW today at 230.  Nothing further is needed.

## 2020-11-19 NOTE — Progress Notes (Deleted)
Virtual Visit via Telephone Note  I connected with Anne Rush on 11/19/20 at  4:00 PM EST by telephone and verified that I am speaking with the correct person using two identifiers.  Location: Patient: Home Provider: Office   I discussed the limitations, risks, security and privacy concerns of performing an evaluation and management service by telephone and the availability of in person appointments. I also discussed with the patient that there may be a patient responsible charge related to this service. The patient expressed understanding and agreed to proceed.   History of Present Illness: 60 year old female, former smoker. PMH significant for ILD, allergic rhinitis, acute respiratory failure, GERD, chronic systolic heart failure, complete heart block, HTN,  Obesity. Patient of Dr. Chase Caller, last seen on 09/05/20. Diagnosed with ILD in 2012, previous smoker. Significant mold exposure 4 years ago. New oxygen requirement. HRCT showed alternative pattern to UIP but no clear description of air trapping. She may have had RB-ILD in hte past or NSIP based on CT imaging. Serologies are negative. She does not want any biopsies or invasive procedure. Clinically this is not UIP/IPF and there is no evidence of autoimmune ILD. Baseline features overall could be NSIP vs hypersensitivity pneumonitis or smoking related   Previous [facility] pulmonary encounters: 09/05/20- Dr. Louann Liv J Conkright 60 y.o. -and this telephone visit she wanted to discuss about coming off prednisone.  Since I last saw her she ended up with bacterial sepsis.  She saw a nurse practitioner for urologic clearance.  She has a ureteral stent.  She is feeling fine now.  She does have some fatigue and myalgias post sepsis and post urologic procedure but otherwise is feeling good.  In terms of her shortness of breath.  She tells me that she is definitely worse than baseline compared to preadmission when I first met her.  She has dyspnea on  exertion class II-3.  She uses oxygen for subjective relief but does not necessarily check her pulse ox if it goes below 88%.  She believes it goes below 88%.  She is on prednisone now 5 mg Monday Wednesday Friday.  She wants to come off this.  She is here to have Covid vaccine.  She says she is scared about getting Covid vaccine because of her immune system.  I reviewed her latest high-resolution CT chest.  Her groundglass opacities have resolved but she now has chronicity.  This means her CT scan is better than earlier this year when she was in [place] but worse than 2012 when she originally was found to have ILD.  And otherwise she has progressive phenotype of non-- IPF ILD   11/19/2020 - Interim hx  Patient contacted today for sick visit.          Observations/Objective:     Pulmonary function testing 04/04/20- FVC 2.25 (69%), FEV1 1.92 (75%), ratio 85, DLCOcor 13.23 (66%)  Imaging: 04/03/20 HRCT- Spectrum of findings compatible with fibrotic interstitial lung disease without frank honeycombing or clear apicobasilar gradient. Findings have progressed since 2012 chest CT. The regions of immediate subpleural sparing are more suggestive of fibrotic phase nonspecific interstitial pneumonia (NSIP), although usual interstitial pneumonia (UIP) is on the differential particularly given progression. Findings are indeterminate for UIP   07/15/20 CXR - Mild left base atelectasis. No edema or airspace opacity. Heart upper normal in size. Pacemaker leads attached to right atrium and right ventricle. No evident adenopathy.      Assessment and Plan:   Follow Up  Instructions:    I discussed the assessment and treatment plan with the patient. The patient was provided an opportunity to ask questions and all were answered. The patient agreed with the plan and demonstrated an understanding of the instructions.   The patient was advised to call back or seek an in-person evaluation if the  symptoms worsen or if the condition fails to improve as anticipated.  I provided *** minutes of non-face-to-face time during this encounter.   Martyn Ehrich, NP

## 2020-11-19 NOTE — Patient Instructions (Signed)
Extending doxycycline additional 7 days Take over the counter delsym twice a day  Use albuterol 2 puffs every 6 hours for shortness of breath/wheezing or cough Follow-up if not better in 7-10 days, would recommend covid testing and CXR

## 2020-11-24 ENCOUNTER — Other Ambulatory Visit: Payer: Self-pay

## 2020-11-24 MED ORDER — CARVEDILOL 25 MG PO TABS: 25.0000 mg | ORAL_TABLET | Freq: Two times a day (BID) | ORAL | 1 refills | Status: DC

## 2020-11-27 ENCOUNTER — Other Ambulatory Visit: Payer: Self-pay | Admitting: Family Medicine

## 2020-11-27 NOTE — Telephone Encounter (Signed)
Last office visit 10/16/2020 for CPE.  Last refilled 05/14/2020 for #30 with 3 refills.  No future appointments with PCP.

## 2020-12-01 DIAGNOSIS — L814 Other melanin hyperpigmentation: Secondary | ICD-10-CM | POA: Diagnosis not present

## 2020-12-01 DIAGNOSIS — Z08 Encounter for follow-up examination after completed treatment for malignant neoplasm: Secondary | ICD-10-CM | POA: Diagnosis not present

## 2020-12-01 DIAGNOSIS — L309 Dermatitis, unspecified: Secondary | ICD-10-CM | POA: Diagnosis not present

## 2020-12-01 DIAGNOSIS — L308 Other specified dermatitis: Secondary | ICD-10-CM | POA: Diagnosis not present

## 2020-12-01 DIAGNOSIS — Z85828 Personal history of other malignant neoplasm of skin: Secondary | ICD-10-CM | POA: Diagnosis not present

## 2020-12-04 DIAGNOSIS — J9601 Acute respiratory failure with hypoxia: Secondary | ICD-10-CM | POA: Diagnosis not present

## 2020-12-04 DIAGNOSIS — I5189 Other ill-defined heart diseases: Secondary | ICD-10-CM | POA: Diagnosis not present

## 2020-12-09 ENCOUNTER — Telehealth: Payer: Self-pay

## 2020-12-09 NOTE — Telephone Encounter (Signed)
Lm for reminder of covid test prior to PFT.  12[facility]12/2020 at 8:[facility]

## 2020-12-09 NOTE — Telephone Encounter (Signed)
Patient is aware of date/time of covid test.   

## 2020-12-11 ENCOUNTER — Ambulatory Visit (INDEPENDENT_AMBULATORY_CARE_PROVIDER_SITE_OTHER): Payer: BC Managed Care – PPO

## 2020-12-11 DIAGNOSIS — I442 Atrioventricular block, complete: Secondary | ICD-10-CM | POA: Diagnosis not present

## 2020-12-12 ENCOUNTER — Other Ambulatory Visit
Admission: RE | Admit: 2020-12-12 | Discharge: 2020-12-12 | Disposition: A | Discharge status: Home / Self Care | Payer: BC Managed Care – PPO | Source: Ambulatory Visit | Attending: Internal Medicine | Admitting: Internal Medicine

## 2020-12-12 ENCOUNTER — Other Ambulatory Visit: Payer: Self-pay

## 2020-12-12 DIAGNOSIS — Z01812 Encounter for preprocedural laboratory examination: Secondary | ICD-10-CM | POA: Insufficient documentation

## 2020-12-12 DIAGNOSIS — J849 Interstitial pulmonary disease, unspecified: Secondary | ICD-10-CM | POA: Insufficient documentation

## 2020-12-12 DIAGNOSIS — Z20822 Contact with and (suspected) exposure to covid-19: Secondary | ICD-10-CM | POA: Insufficient documentation

## 2020-12-12 LAB — SARS CORONAVIRUS 2 (TAT 6-24 HRS): SARS Coronavirus 2: NEGATIVE

## 2020-12-13 LAB — CUP PACEART REMOTE DEVICE CHECK
Battery Remaining Longevity: 100 mo
Battery Remaining Percentage: 95.5 %
Battery Voltage: 2.96 V
Brady Statistic AP VP Percent: 2 %
Brady Statistic AP VS Percent: 1 %
Brady Statistic AS VP Percent: 98 %
Brady Statistic AS VS Percent: 1 %
Brady Statistic RA Percent Paced: 1.9 %
Brady Statistic RV Percent Paced: 99 %
Date Time Interrogation Session: 20220324020013
Implantable Lead Implant Date: 20170608
Implantable Lead Implant Date: 20170608
Implantable Lead Location: 753859
Implantable Lead Location: 753860
Implantable Pulse Generator Implant Date: 20170608
Lead Channel Impedance Value: 410 Ohm
Lead Channel Impedance Value: 410 Ohm
Lead Channel Pacing Threshold Amplitude: 0.75 V
Lead Channel Pacing Threshold Amplitude: 1 V
Lead Channel Pacing Threshold Pulse Width: 0.4 ms
Lead Channel Pacing Threshold Pulse Width: 0.4 ms
Lead Channel Sensing Intrinsic Amplitude: 12 mV
Lead Channel Sensing Intrinsic Amplitude: 2.5 mV
Lead Channel Setting Pacing Amplitude: 2 V
Lead Channel Setting Pacing Amplitude: 2.5 V
Lead Channel Setting Pacing Pulse Width: 0.4 ms
Lead Channel Setting Sensing Sensitivity: 2.5 mV
Pulse Gen Model: 2272
Pulse Gen Serial Number: 7903881

## 2020-12-15 ENCOUNTER — Ambulatory Visit: Payer: BC Managed Care – PPO

## 2020-12-15 ENCOUNTER — Other Ambulatory Visit: Payer: Self-pay

## 2020-12-15 DIAGNOSIS — Z01812 Encounter for preprocedural laboratory examination: Secondary | ICD-10-CM | POA: Diagnosis not present

## 2020-12-15 DIAGNOSIS — Z20822 Contact with and (suspected) exposure to covid-19: Secondary | ICD-10-CM | POA: Diagnosis not present

## 2020-12-15 DIAGNOSIS — J849 Interstitial pulmonary disease, unspecified: Secondary | ICD-10-CM | POA: Diagnosis not present

## 2020-12-15 MED ORDER — ALBUTEROL SULFATE (2.5 MG/3ML) 0.083% IN NEBU
2.5000 mg | INHALATION_SOLUTION | Freq: Once | RESPIRATORY_TRACT | Status: AC
  Filled 2020-12-15: qty 3

## 2020-12-23 NOTE — Progress Notes (Signed)
Remote pacemaker transmission.   

## 2021-01-01 ENCOUNTER — Other Ambulatory Visit: Payer: Self-pay

## 2021-01-01 ENCOUNTER — Ambulatory Visit (INDEPENDENT_AMBULATORY_CARE_PROVIDER_SITE_OTHER): Payer: BC Managed Care – PPO | Admitting: Internal Medicine

## 2021-01-01 ENCOUNTER — Encounter: Payer: Self-pay | Admitting: Internal Medicine

## 2021-01-01 ENCOUNTER — Other Ambulatory Visit: Payer: Self-pay | Admitting: Family Medicine

## 2021-01-01 VITALS — BP 124/74 | HR 95 | Temp 97.5°F | Ht 63.0 in | Wt 203.0 lb

## 2021-01-01 DIAGNOSIS — R5383 Other fatigue: Secondary | ICD-10-CM

## 2021-01-01 DIAGNOSIS — M2550 Pain in unspecified joint: Secondary | ICD-10-CM | POA: Diagnosis not present

## 2021-01-01 DIAGNOSIS — R0600 Dyspnea, unspecified: Secondary | ICD-10-CM

## 2021-01-01 DIAGNOSIS — R0609 Other forms of dyspnea: Secondary | ICD-10-CM

## 2021-01-01 DIAGNOSIS — J849 Interstitial pulmonary disease, unspecified: Secondary | ICD-10-CM | POA: Diagnosis not present

## 2021-01-01 LAB — SEDIMENTATION RATE: Sed Rate: 10 mm/hr (ref 0–30)

## 2021-01-01 NOTE — Patient Instructions (Addendum)
ILD (interstitial lung disease) ([place])  - stable (see PFT chart below  Plan  - spiro/dlco in 6-9 months  Fatigue, unspecified type Arthralgia, unspecified joint Dyspnea  - Thyrpid tests Jan 2022 is normal - need to sort out if just post sepsis (oct 2021) fatigue or  A bone/joint issue now that you are off prednisone or weight/deconditioning/stiff heart muscle (seen in echo July 2021)  Plan  - check ESR, Quant Gold, ANA, RF, CCP - will call with rsults  - might just need to attend pulm rehab  Followup  6-9 months but after breathing test  xxxxxxxxxxxxxx  Results for LORRANE, MCCAY (MRN [id number]) as of 01/01/2021 09:20  Ref. Range 04/04/2020 12:46 09/01/2020 11:15 12/15/20 at [facility]  FVC-Pre Latest Units: L 2.25  2.53  FVC-%Pred-Pre Latest Units: % 69 78 78%   Results for TEYA, OTTERSON (MRN [id number]) as of 01/01/2021 09:20  Ref. Range 04/04/2020 12:46 09/01/2020 11:15 12/15/20 [facility]  DLCO unc Latest Units: ml/min/mmHg 14.04 15.24 14.5  DLCO unc % pred Latest Units: % 71 77 73%

## 2021-01-01 NOTE — Addendum Note (Signed)
Addended byCarlynn Herald M on: 01/01/2021 11:30 AM   Modules accepted: Orders

## 2021-01-01 NOTE — Progress Notes (Signed)
[name]    History    60 y/o F, former smoker, who presented to [facility] on 7/13 with worsening shortness of breath.   The patient reports her increase in shortness of breath began around the first of July.  She had noted her oxygen levels to be in the high 80's at home.  She reports progressively increased shortness of breath, dyspnea with exertion and difficulty walking a flight of stairs.  She has also noted a tightness in the middle of her chest.  She has not had a COVID vaccine.    The patient is a Clinical cytogeneticist.  She is a former smoker quit 2012 after diagnosis of ILD.  She was previously followed by Pulmonary at [facility] (Dr. Brock Bad, last seen in 2016) for ground glass opacities but has not seen them in several years due to financial constraints. She was supposed to have a lung biopsy. Her symptoms at the time were thought to be RBILD.  At that time she quit smoking and her FEV1 increased from 85 to >90 and infiltrates improved on CT.   ER evaluation included a CTA chest which was negative for PE but showed worsening ILD from her prior CT scan.  Troponin flat.    PCCM consulted for pulmonary evaluation.  Pets: dog in the house Allergies: none Occupation: Clinical cytogeneticist for [employer]: none Travel: none recent   No heavy dust exposures  No bird exposures    [place] Integrated Comprehensive ILD Questionnaire  Symptoms: Recent symptoms started suddenly.  Since it started 1 month ago is getting worse.  There is episodic dyspnea.  Severity is listed below.  She also has a cough that she reports specifically started on Monday, March 31, 2020.  Since it started and with steroids it is better.  It is mild in intensity.  She does bring up some phlegm that is green.  A separate brown speckles.  There is no hemoptysis.  Cough does not get worse when she lies down.  Does not affect her voice.  Does not clear her throat.  No tickle.  All the symptoms started  approximately 5 weeks before admission.  SYMPTOM SCALE - ILD 04/04/2020   O2 use RA - 87% in hospital  Shortness of Breath 0 -> 5 scale with 5 being worst (score 6 If unable to do)  At rest 2  Simple tasks - showers, clothes change, eating, shaving 2  Household (dishes, doing bed, laundry) 5  Shopping 5  Walking level at own pace 4  Walking up Stairs 5  Total (30-36) Dyspnea Score 23  How bad is your cough? x  How bad is your fatigue x  How bad is nausea x  How bad is vomiting?  x  How bad is diarrhea? x  How bad is anxiety? x  How bad is depression x       Past Medical History : Positive for heart failure.  Third-degree heart block she has a pacer.  This has been going on for several years.  Currently ejection fraction reported as slightly low normal but according to Dr. Maryland Pink and the hospitalist cardiology reviewed the echo again and feel the ejection fraction is normal.  Current BNP is normal.  She has obesity.  Otherwise negative past medical history.  No COPD or asthma.  No collagen vascular disease no scleroderma no lupus no polymyositis.  Autoimmune panel is all negative.  No stroke or thyroid disease.  No  hepatitis.  No history of blood clot.   ROS: Positive for dyspnea.  Was stable dyspnea all the way from 2012 up until recently 5 weeks ago.  There is associated with fatigue and arthralgia.  No dysphagia.  No acid reflux.  No vomiting no nausea.   FAMILY HISTORY of LUNG DISEASE: Positive for COPD in the diet and asthma in the past.  No history of pulmonary fibrosis.  No cystic fibrosis denies hypersensitive pneumonitis no autoimmune disease.   EXPOSURE HISTORY:  -She did smoke cigarettes between 1979 and 2012.  She quit smoking in 2012 when her interstitial lung disease was diagnosed.  She smoked around 10 cigarettes a day.  No cigars.  No passive smoking.  No vaping.  No marijuana use.  No cocaine use.  No intravenous drug use.   HOME and HOBBY  DETAILS : [place]  Is a single-family home is also 60 years of age.  In the house there is no dampness.  There is no mold or mildew in the shower curtain.  There is no mold or mildew anywhere in the house.  There is no humidifier in the house no CPAP use in the house.  No nebulizer machine.  No steam iron.  No Jacuzzi.  No misting [place] in the house.  No pet birds or parakeets.  No pet gerbils.  No feather pillows or blankets.  No music habits.  No gardening habits.  She does throw prone to the deer but there is no mold in the:   OCCUPATIONAL HISTORY (122 questions) : There is significant water damage or mold exposure at work.  She works for [employer] since 2016.  Prior to this she was not working any atmosphere that had dampness of mold.  She states the building is a very old building.  She says that there has been significant water damage and water leak.  Water has port in into the several rooms in the office.  She says there is mold in the hallways.  There is mold in the break room.  There is mold in the bosses office.  She is not sure but she thinks there is mold even in her cubicle.  Is been going on for several years but increased in the last few years.  She says on her desk every morning there are black spots but she does not know what it is.  Her husband showed pictures of the office of the right leg significant dampness and rotting of the walls and floor.  She works as a Radiation protection practitioner.  It is just her and her boss.  Rest of the questionnaire is negative.   PULMONARY TOXICITY HISTORY (27 items): Never been on urinary antibiotic Macrodantin.  Currently on prednisone.   RECENT LABS  Results for Anne, Rush (MRN [id number]) as of 04/04/2020 12:42  Ref. Range 04/02/2020 18:57  Anti Nuclear Antibody (ANA) Latest Ref Range: Negative  Negative  ANCA Proteinase 3 Latest Ref Range: 0.0 - 3.5 U/mL <3.5  CCP  Antibodies IgG/IgA Latest Ref Range: 0 - 19 units 4  Myeloperoxidase Abs Latest Ref Range: 0.0 - 9.0 U/mL <9.0  RA Latex Turbid. Latest Ref Range: 0.0 - 13.9 IU/mL <10.0    CT Chest High Resolution  Result Date: 04/03/2020 CLINICAL DATA:  Inpatient. Evaluate interstitial lung disease. Dyspnea. EXAM: CT CHEST WITHOUT CONTRAST TECHNIQUE: Multidetector CT imaging of the chest was performed  following the standard protocol without intravenous contrast. High resolution imaging of the lungs, as well as inspiratory and expiratory imaging, was performed. COMPARISON:  04/02/2020 chest CT angiogram.  10/08/2010 chest CT. FINDINGS: Cardiovascular: Normal heart size. No significant pericardial effusion/thickening. Two lead left subclavian pacemaker with lead tips in the right atrium and right ventricular apex. Mildly atherosclerotic nonaneurysmal thoracic aorta. Normal caliber pulmonary arteries. Mediastinum/Nodes: No discrete thyroid nodules. Unremarkable esophagus. No pathologically enlarged axillary, mediastinal or hilar lymph nodes, noting limited sensitivity for the detection of hilar adenopathy on this noncontrast study. Lungs/Pleura: No pneumothorax. No pleural effusion. Mild centrilobular and paraseptal emphysema in the upper lobes. No acute consolidative airspace disease or lung masses. Right middle lobe solid 7 mm pulmonary nodule along the minor fissure (series 7/image 65), stable since 2012 CT, considered benign. No additional significant pulmonary nodules. Patchy confluent peripheral reticulation and peripheral lines throughout both lungs with associated mild traction bronchiectasis and architectural distortion, with regions of relative sparing of the immediate subpleural lungs. No clear apicobasilar gradient to these findings. No frank honeycombing (mild subpleural cystic changes in the upper lobes are favored represent paraseptal emphysema). These findings have progressed significantly since 10/08/2010  chest CT. Upper abdomen: No acute abnormality. Musculoskeletal: No aggressive appearing focal osseous lesions. Moderate thoracic spondylosis. IMPRESSION: 1. Spectrum of findings compatible with fibrotic interstitial lung disease without frank honeycombing or clear apicobasilar gradient. Findings have progressed since 2012 chest CT. The regions of immediate subpleural sparing are more suggestive of fibrotic phase nonspecific interstitial pneumonia (NSIP), although usual interstitial pneumonia (UIP) is on the differential particularly given progression. Findings are indeterminate for UIP per consensus guidelines: Diagnosis of Idiopathic Pulmonary Fibrosis: An Official ATS/ERS/JRS/ALAT Clinical Practice Guideline. [place], Iss 5, [phone], May 21 2017. 2. Aortic Atherosclerosis (ICD10-I70.0) and Emphysema (ICD10-J43.9). Electronically Signed   By: Ilona Sorrel M.D.   On: 04/03/2020 17:23   [facility] Events   7/13 Admit  7/15 -  she says in jan 2012 dx with ILD . Jan 2021 was CT. In may 2012 she quti smoking. Followed with Dr Anselmo Rod. Last visit with him and CT was in 2015. But stable till June 2021 and desaturating with exercise at home. Now needing oxygen. She is nervous about procedures incluidng bronch. Feels better after steroids   04/04/2020 - > feels better. 87% on RA at rest now.  Husband reports mild confusion overnight.  He is wondering if the lorazepam, steroids and oxygen are contributing to this.  This morning she not as happy as and cheerful as before.  Otherwise no new complaints.  Overall she feels better since admission and attributes to steroids.  We went over the ILD questionnaire and is documented below.  Assessment & Plan:   #Baseline: Interstitial lung disease -in 2012.  Previous smoker and quit and stable  #Current: Significant mold exposure x4 years especially worse in the last few years.  Interstitial lung disease flareup with new  oxygen dependency this admission.  High-resolution CT chest alternative pattern to UIP but no clear description of air trapping  -Discussion: She might have had RB ILD in the past or NSIP based on the CT scan appearance.  She is doing stable after quitting smoking but in the last few years she has had mold exposure and it has been a deterioration and presentation with symptoms suggestive of ILD flareup.  It appears that systolic heart failure is not under consideration especially with normal BNP.  Serologies  are negative.  She does not want any biopsy or invasive procedures.  Clinically this is not UIP/IPF.  There is no evidence of autoimmune interstitial lung disease.  Baseline features overall could be NSIP or hypersensitive pneumonitis [although no air-trapping] or possibly smokers interstitial lung disease.  However the current flareup seems temporally related to mold exposure.  Is the only inciting antigen that we can find currently.  And therefore this could be a flareup and progression of previous NSIP or she has underlying hypersensitive pneumonitis.  Plan -Under no circumstances she should go back to her work atmosphere where there is mold exposure  -Needs prednisone for few to several weeks and may be 1 few months depending on the course [she seems to be improving although there is some mild confusion last night which could be multifactorial from benzodiazepines and steroids] -> okay to switch to p.o. prednisone April 05, 2020 at 50 mg/day x 2 weeks and then 40 mg/day x 2 weeks and then 30 mg/day x 2 weeks and then 20 mg/day x 2 weeks and then 10 mg/day to continue until further instructions  -Obtain hypersensitive pneumonitis blood panel while in the hospital and blood QuantiFERON gold  -Obtain spirometry and DLCO while in the hospital if possible  -Seems to still need oxygen at rest 2 L [today she is 87% on room air at rest although this is an improvement]  -Meet with [employer] manager for disability information which she is interested  - Prognosis: Unclear prognosis.  It is possible that with steroids over few to several weeks or a few months that she improves and goes back to baseline.  It is also possible that she has only partial improvement.  There is also a small chance she just deals with progressive ILD in which case antifibrotic's would be indicated as an outpatient.  Overall I am optimistic that she would have at least a partial improvement  -She would need to lose weight and get fit (given age below 41 -in the long run she might be a candidate for lung transplant if the need arose] -we will address this in the office  -Hospitalist to deal with encephalopathy if present.  At this point in time I have discontinued the lorazepam but left on her home diazepam   - can go home 04/05/20= after social work and o2 asessment  She and her husband updated  Results for DAHNA, HATTABAUGH (MRN [id number]) as of 04/03/2020 13:38  Ref. Range 10/12/2010 20:44 05/12/2015 09:18 05/12/2015 09:33 04/02/2020 18:57  Anti Nuclear Antibody (ANA) Latest Ref Range: NEGATIVE  NEG NEG    Cyclic Citrullin Peptide Ab Latest Ref Range: 0.0 - 5.0 U/mL  <2.0    ds DNA Ab Latest Units: IU/mL  1    RA Latex Turbid. Latest Ref Range: 0.0 - 13.9 IU/mL <10 IU/mL <10  <10.0  Tissue Transglut Ab Latest Ref Range: <6 U/mL  1    ENA SM Ab Ser-aCnc Latest Ref Range: <1.0 NEG AI    <1.0 NEG   Results for TELINA, KLECKLEY (MRN [id number]) as of 04/03/2020 13:38  Ref. Range 08/27/2010 11:32 10/12/2010 09:33 05/12/2015 09:01 04/02/2020 18:57  Sed Rate Latest Ref Range: 0 - 22 mm/hr 16 16 10 8       OV 06/06/2020  Subjective:  Patient ID: Anne Rush, female , DOB: [birth year] , age 50 y.o. , MRN: [id number] , ADDRESS: [place] [address]   06/06/2020 -  Chief Complaint  Patient presents with  . Follow-up    breathing is doing well with 2L. c/o non  prod cough.    #Baseline interstitial lung disease in 2012 with progression in July 2021.  High-resolution CT chest pattern with alternative diagnosis suggestive of NSIP but significant mold exposure at workplace leading up to July 2021 although no air-trapping.  Started empiric prednisone mid July 2021.  Reluctant to have surgical lung biopsy or transbronchial biopsy of bronchoscopy lavage  HPI Anne Rush 60 y.o. -returns for follow-up.  Last seen in mid July 2021 in the hospital.  Since then she is seen nurse practitioner once towards end of July 2021.  She reports overall improvement in symptoms but still has some dyspnea.  She says when she is on room air up and about in the house after 45 minutes to 1 hour her pulse ox nadir is around 88 to 92%.  She then uses oxygen to give her some relief.  She does not realize that oxygen is indicated if it is less than 88% only.  She says she is doing well with the prednisone overall although she has gained weight and has developed features of "moon face".  She continues to be hesitant about having any intervention to diagnose underlying ILD.  She is now off work and she is trying to pursue Social Security disability.  We did discuss the fact that if her condition improves she may not qualify for it.  She is not interested in [employer]. because she has good personal relationship with her previous Librarian, academic.  There is an invitation for her to work remotely and she is pondering that.  She does not have social short-term disability through her employer  In terms of vaccination:  She is going to have a flu shot in the month of October 2021 through primary care physician.  She has not had the Covid vaccine.  She is hesitant about the Covid vaccine because of fear of side effects and efficacy.  We discussed this extensively.  She is immunosuppressed because of prednisone.  She is also has interstitial lung disease and obesity and age over 57.   07/23/2020- APP  visit Patient presents today for surgical clearance. She is planning cystoscopy/ureteroscopy and stent exchange on Monday November 8th with Dr. Hollice [facility]. She has no acute respiratory complaints. Her breathing has stayed the same. She goes most days at home without needing to use oxygen. She may need to use 2L 1-2 times a week for a couple of hours. She monitors her levels and she rarely see O2 below 88% RA.  She has not been vaccinated for covid-19, unsure if she will get it. Not currently smoking, quit in 2012. Her weight remains stable since September. She has some lower back and abdominal pain which she takes hydrocodone at bedtime. She is following with ID and urology. Currenty on Pena which she will complete 2 days after surgery. Denies acute symptoms of cough, chest tightness, wheezing. Afebrile.      OV 09/05/2020   Subjective:  Patient ID: Anne Rush, female , DOB: November 14, 1960, age 4 y.o. years. , MRN: [id number],  ADDRESS: [place] [address] PCP  Owens Loffler, MD Providers : Treatment Team:  Attending Provider: Brand Males, MD Patient Care Team: Owens Loffler, MD as PCP - General (Family Medicine) Constance Haw, MD as PCP - Electrophysiology (Cardiology) O'Neal, Cassie Freer, MD as PCP - Cardiology (Cardiology)  Type of  visit: Telephone/Video Circumstance: COVID-19 national emergency Identification of patient Anne Rush with 1961/05/18 and MRN [id number] - 2 person identifier Risks: Risks, benefits, limitations of telephone visit explained. Patient understood and verbalized agreement to proceed Anyone else on call: none Patient location: her home This provider location: [facility] in [place]   FU ILD.   #Baseline interstitial lung disease in 2012 with progression in July 2021.  High-resolution CT chest pattern with alternative diagnosis suggestive of NSIP but significant mold exposure at workplace leading up to  July 2021 although no air-trapping.  Started empiric prednisone mid July 2021.  Reluctant to have surgical lung biopsy or transbronchial biopsy of bronchoscopy lavage   HPI Anne Rush 60 y.o. -and this telephone visit she wanted to discuss about coming off prednisone.  Since I last saw her she ended up with bacterial sepsis.  She saw a nurse practitioner for urologic clearance.  She has a ureteral stent.  She is feeling fine now.  She does have some fatigue and myalgias post sepsis and post urologic procedure but otherwise is feeling good.  In terms of her shortness of breath.  She tells me that she is definitely worse than baseline compared to preadmission when I first met her.  She has dyspnea on exertion class II-3.  She uses oxygen for subjective relief but does not necessarily check her pulse ox if it goes below 88%.  She believes it goes below 88%.  She is on prednisone now 5 mg Monday Wednesday Friday.  She wants to come off this.  She is here to have Covid vaccine.  She says she is scared about getting Covid vaccine because of her immune system.  I reviewed her latest high-resolution CT chest.  Her groundglass opacities have resolved but she now has chronicity.  This means her CT scan is better than earlier this year when she was in [place] but worse than 2012 when she originally was found to have ILD.  And otherwise she has progressive phenotype of non-- IPF ILD   CT Chest data 09/01/20  Narrative & Impression  CLINICAL DATA:  Follow-up interstitial lung disease, on steroid therapy. Dyspnea with exertion with intermittent oxygen use.  EXAM: CT CHEST WITHOUT CONTRAST  TECHNIQUE: Multidetector CT imaging of the chest was performed following the standard protocol without intravenous contrast. High resolution imaging of the lungs, as well as inspiratory and expiratory imaging, was performed.  COMPARISON:  04/02/2020 chest CT angiogram.  FINDINGS: Cardiovascular: Normal heart  size. No significant pericardial effusion/thickening. Two lead left subclavian pacemaker with lead tips in the right atrium and right ventricular apex. Atherosclerotic nonaneurysmal thoracic aorta. Normal caliber pulmonary arteries.  Mediastinum/Nodes: No discrete thyroid nodules. Unremarkable esophagus. No pathologically enlarged axillary, mediastinal or hilar lymph nodes, noting limited sensitivity for the detection of hilar adenopathy on this noncontrast study.  Lungs/Pleura: No pneumothorax. No pleural effusion. No acute consolidative airspace disease or lung masses. Few scattered small solid right pulmonary nodules, largest 4 mm along the minor fissure (series 13/image 69), all stable and considered benign. No new significant pulmonary nodules. No significant lobular air trapping or evidence of tracheobronchomalacia on the expiration sequence. Mild centrilobular and paraseptal emphysema. Patchy mild-to-moderate peripheral ground-glass opacity and reticulation throughout both lungs with associated minimal architectural distortion. No significant regions traction bronchiectasis. No frank honeycombing. These findings (particularly the previously described peripheral reticulation and perilobular lines) appear mildly improved since 04/03/2020 chest CT. The ground-glass opacities are decreased and the reticulation and architectural distortion  are increased since remote 2012 chest CT. Slight upper lung predominance to these findings.  Upper abdomen: Coarse posterior left liver calcification, unchanged.  Musculoskeletal: No aggressive appearing focal osseous lesions. Moderate thoracic spondylosis.  IMPRESSION: 1. Spectrum of findings compatible with fibrotic interstitial lung disease without frank honeycombing, with a slight upper lung predominance. Findings have progressed since remote 2012 chest CT, and appear mildly improved in the interval since 04/03/2020 chest CT.  Differential considerations include chronic hypersensitivity pneumonitis or fibrotic NSIP/postinflammatory fibrosis. Findings are suggestive of an alternative diagnosis (not UIP) per consensus guidelines: Diagnosis of Idiopathic Pulmonary Fibrosis: An Official ATS/ERS/JRS/ALAT Clinical Practice Guideline. [place], Iss 5, [phone], May 21 2017. 2. Aortic Atherosclerosis (ICD10-I70.0) and Emphysema (ICD10-J43.9).   Electronically Signed   By: Ilona Sorrel M.D.   On: 09/01/2020 11:04      PFT  PFT Results Latest Ref Rng & Units 09/01/2020 04/04/2020  FVC-Pre L - 2.25  FVC-Predicted Pre % 78 69  Pre FEV1/FVC % % 82 85  FEV1-Pre L 2.08 1.92  FEV1-Predicted Pre % 81 75  DLCO uncorrected ml/min/mmHg 15.24 14.04  DLCO UNC% % 77 71  DLCO corrected ml/min/mmHg - 13.23  DLCO COR %Predicted % - 66  DLVA Predicted % 86 97   OV 01/01/2021  Subjective:  Patient ID: Anne Rush, female , DOB: [birth year] , age 73 y.o. , MRN: [id number] , ADDRESS: [place] [address] PCP Owens Loffler, MD Patient Care Team: Owens Loffler, MD as PCP - General (Family Medicine) Constance Haw, MD as PCP - Electrophysiology (Cardiology) O'Neal, Cassie Freer, MD as PCP - Cardiology (Cardiology)  This Provider for this visit: Treatment Team:  Attending Provider: Brand Males, MD    01/01/2021 -   Chief Complaint  Patient presents with  . Follow-up    Doing better, still winded going up stairs    FU ILD.   #Baseline interstitial lung disease in 2012 with progression in July 2021.  High-resolution CT chest pattern with alternative diagnosis suggestive of NSIP but significant mold exposure at workplace leading up to July 2021 although no air-trapping.  Started empiric prednisone mid July 2021.  Reluctant to have surgical lung biopsy or transbronchial biopsy of bronchoscopy lavage. Off prednisone end Jan 2022   L;ast hrct dec  2021  HPI Anne Rush 60 y.o. -returns for follow-up with her husband for her ILD.  She tells me from a ILD standpoint dyspnea standpoint she is stable.  Does not use oxygen.  She is off prednisone and she is happy about that.  However she says since her sepsis admission in October 2021 she has lot of fatigue.  She is mostly in the house.  She does not have the motivation to go out.  She also has leg diffuse arthralgia.  This is adding to her shortness of breath.  She is wondering the rationale for this.  Back in July her ESR was normal and her TSH was normal in January 2022.  Current pulmonary function test shows stability.  ILD symptom score for dyspnea shows improvement but fatigue still persists.  Walking desaturation test is stable.  All documented below she is off prednisone.    CT Chest data  No results found.    SYMPTOM SCALE - ILD 04/04/2020  06/06/2020  01/01/2021   O2 use RA - 87% in hospital Uses o2 at home but below walk in on RA RA at home. Does not use  02  Shortness of Breath 0 -> 5 scale with 5 being worst (score 6 If unable to do)    At rest 2 - 0  Simple tasks - showers, clothes change, eating, shaving 2 3 0  Household (dishes, doing bed, laundry) 5 3 3   Shopping 5 x x  Walking level at own pace 4 2 0  Walking up Stairs 5 3.5 5  Total (30-36) Dyspnea Score 23 11.5 8  How bad is your cough? x 1 1  How bad is your fatigue x 4 4  How bad is nausea x 0 0  How bad is vomiting?  x 0 0  How bad is diarrhea? x 2 0  How bad is anxiety? x 3 4  How bad is depression x 1 0      Simple office walk 185 feet x  3 laps goal with forehead probe 06/06/2020  01/01/2021   O2 used ra ra  Number laps completed 3 3  Comments about pace normal avg  Resting Pulse Ox/HR 98% and 101/min 99% and 95/min  Final Pulse Ox/HR 95% and 118/min 97% and 100/min  Desaturated </= 88% x x  Desaturated <= 3% points c=x x  Got Tachycardic >/= 90/min x yes  Symptoms at end of test x Some  dyspnea on last lap  Miscellaneous comments x x      PFT  Results for Anne, Rush (MRN [id number]) as of 01/01/2021 09:20  Ref. Range 04/04/2020 12:46 09/01/2020 11:15 12/15/20 at [facility]  FVC-Pre Latest Units: L 2.25  2.53  FVC-%Pred-Pre Latest Units: % 69 78 78%   Results for NIKIA, LEVELS (MRN [id number]) as of 01/01/2021 09:20  Ref. Range 04/04/2020 12:46 09/01/2020 11:15 12/15/20 [facility]  DLCO unc Latest Units: ml/min/mmHg 14.04 15.24 14.5  DLCO unc % pred Latest Units: % 71 77 73%     has a past medical history of Chronic systolic CHF (congestive heart failure) ([place]) (07/15/2020), Complete heart block ([place]) (02/26/2016), Family history of ovarian cancer, Generalized anxiety disorder (07/15/2020), GERD (gastroesophageal reflux disease), History of kidney stones, Hyperlipidemia, Hypertension, Interstitial lung disease ([place]), Pollen allergies, Presence of permanent cardiac pacemaker, Squamous cell carcinoma (2014), and Subclinical hyperthyroidism (06/17/2014).   reports that she quit smoking about 10 years ago. She has a 15.00 pack-year smoking history. She has never used smokeless tobacco.  Past Surgical History:  Procedure Laterality Date  . CESAREAN SECTION    . COSMETIC SURGERY     skin cancer face  . CYSTOSCOPY W/ URETERAL STENT PLACEMENT Left 07/15/2020   Procedure: CYSTOSCOPY WITH RETROGRADE PYELOGRAM/URETERAL STENT PLACEMENT;  Surgeon: Abbie Sons, MD;  Location: [facility];  Service: Urology;  Laterality: Left;  . CYSTOSCOPY/URETEROSCOPY/HOLMIUM LASER/STENT PLACEMENT Left 07/28/2020   Procedure: CYSTOSCOPY/URETEROSCOPY/HOLMIUM LASER/STENT Exchange;  Surgeon: Hollice Espy, MD;  Location: [facility];  Service: Urology;  Laterality: Left;  . EP IMPLANTABLE DEVICE N/A 02/26/2016   Procedure: Pacemaker Implant;  Surgeon: Will Meredith Leeds, MD;  Location: [facility];  Service: Cardiovascular;  Laterality: N/A;  . TUBAL LIGATION      Allergies  Allergen Reactions  .  Codeine Hives and Itching  . Sulfonamide Derivatives Hives  . Paroxetine Other (See Comments)    unknown  . Lisinopril Rash  . Losartan Potassium Other (See Comments)    Leg felt heavy  . Neomycin-Polymyxin-Dexameth Itching, Swelling and Other (See Comments)    Ey drop/Swelling and itching of eye   . Oxycodone-Acetaminophen Other (See Comments)  Mild SOB, feels funny  . Sulfa Antibiotics Hives and Rash         Immunization History  Administered Date(s) Administered  . Influenza Nasal 06/20/2017  . Influenza Whole 07/04/2008, 06/18/2009, 08/27/2010  . Influenza, Seasonal, Injecte, Preservative Fre 09/03/2010, 06/22/2011, 06/27/2012  . Influenza,inj,Quad PF,6+ Mos 07/21/2015, 07/06/2018  . Influenza-Unspecified 06/27/2012, 07/17/2014, 07/19/2016, 07/21/2019  . Td 05/10/2002  . Tdap 10/28/2014    Family History  Problem Relation Age of Onset  . Anxiety disorder Mother   . Hypertension Mother   . Ovarian cancer Mother 61  . Cancer Maternal Aunt        breast  . Heart failure Maternal Grandfather      Current Outpatient Medications:  .  albuterol (VENTOLIN HFA) 108 (90 Base) MCG/ACT inhaler, Inhale 1-2 puffs into the lungs every 6 (six) hours as needed for wheezing or shortness of breath., Disp: 18 g, Rfl: 2 .  carvedilol (COREG) 25 MG tablet, Take 1 tablet (25 mg total) by mouth 2 (two) times daily with a meal., Disp: 180 tablet, Rfl: 1 .  diazepam (VALIUM) 5 MG tablet, TAKE ONE TABLET BY MOUTH EVERY 8 HOURS AS NEEDED FOR ANXIETY, Disp: 30 tablet, Rfl: 3 .  doxycycline (VIBRA-TABS) 100 MG tablet, Take 1 tablet (100 mg total) by mouth 2 (two) times daily. Extend additional 7 days, Disp: 14 tablet, Rfl: 0 .  famotidine (PEPCID) 20 MG tablet, Take 1 tablet (20 mg total) by mouth daily., Disp: 30 tablet, Rfl: 0 .  spironolactone (ALDACTONE) 25 MG tablet, TAKE ONE TABLET BY MOUTH DAILY, Disp: 90 tablet, Rfl: 3 No current facility-administered medications for this  visit.  Facility-Administered Medications Ordered in Other Visits:  .  albuterol (PROVENTIL) (2.5 MG/3ML) 0.083% nebulizer solution 2.5 mg, 2.5 mg, Nebulization, Once, Brand Males, MD      Objective:   Vitals:   01/01/21 0908  BP: 124/74  Pulse: 95  Temp: (!) 97.5 F (36.4 C)  TempSrc: Oral  SpO2: 99%  Weight: 203 lb (92.1 kg)  Height: $Remove'5\' 3"'GkYeQTm$  (1.6 m)    Estimated body mass index is 35.96 kg/m as calculated from the following:   Height as of this encounter: $RemoveBeforeD'5\' 3"'vlerROCUDSnxjo$  (1.6 m).   Weight as of this encounter: 203 lb (92.1 kg).  $Rem'@WEIGHTCHANGE'cJVZ$ @  [employer]   01/01/21 0908  Weight: 203 lb (92.1 kg)     Physical Exam  General: No distress. Looks well. Some deconditioned Neuro: Alert and Oriented x 3. GCS 15. Speech normal Psych: Pleasant Resp:  Barrel Chest - no.  Wheeze - no, Crackles - no, No overt respiratory distress CVS: Normal heart sounds. Murmurs - no Ext: Stigmata of Connective Tissue Disease - no HEENT: Normal upper airway. PEERL +. No post nasal drip        Assessment:       ICD-10-CM   1. ILD (interstitial lung disease) ([place])  J84.9   2. Fatigue, unspecified type  R53.83   3. Arthralgia, unspecified joint  M25.50   4. DOE (dyspnea on exertion)  R06.00        Plan:     Patient Instructions  ILD (interstitial lung disease) ([place])  - stable (see PFT chart below  Plan  - spiro/dlco in 6-9 months  Fatigue, unspecified type Arthralgia, unspecified joint Dyspnea  - Thyrpid tests Jan 2022 is normal - need to sort out if just post sepsis (oct 2021) fatigue or  A bone/joint issue now that you are off prednisome or weight/deconditioning/stiff  heart muschle (seen in echo July 2021)  Plan  - check ESR, Quant Gold, ANA, RF, CCP - will call with rsults  - might just need to attend pulm rehab  Followup  6-9 months but after breathing test   ( Level 05 visit: Estb 40-54 min  in  visit type: on-site physical face to visit  in total care time and  counseling or/and coordination of care by this undersigned MD - Dr Brand Males. This includes one or more of the following on this same day 01/01/2021: pre-charting, chart review, note writing, documentation discussion of test results, diagnostic or treatment recommendations, prognosis, risks and benefits of management options, instructions, education, compliance or risk-factor reduction. It excludes time spent by the [place] or office staff in the care of the patient. Actual time 40 min)   SIGNATURE    Dr. Brand Males, M.D., F.C.C.P,  Pulmonary and Critical Care Medicine Staff Physician, [place] Director - Interstitial Lung Disease  Program  [facility] [place] [facility] [place], [place], [address]  Pager: [phone], If no answer or between  15:00h - 7:00h: call [phone] Telephone: [phone]  9:38 AM 01/01/2021

## 2021-01-05 ENCOUNTER — Telehealth: Payer: Self-pay | Admitting: Internal Medicine

## 2021-01-05 ENCOUNTER — Ambulatory Visit: Payer: BC Managed Care – PPO | Admitting: Internal Medicine

## 2021-01-05 LAB — QUANTIFERON-TB GOLD PLUS
Mitogen-NIL: >10 IU/mL
NIL: 0.02 IU/mL
QuantiFERON-TB Gold Plus: NEGATIVE
TB1-NIL: 0 IU/mL
TB2-NIL: 0 IU/mL

## 2021-01-05 LAB — RHEUMATOID FACTOR: Rheumatoid fact SerPl-aCnc: 14 IU/mL — ABNORMAL HIGH (ref <14)

## 2021-01-05 LAB — ANA: Anti Nuclear Antibody (ANA): NEGATIVE

## 2021-01-05 LAB — CYCLIC CITRUL PEPTIDE ANTIBODY, IGG: Cyclic Citrullin Peptide Ab: <16 UNITS

## 2021-01-05 NOTE — Telephone Encounter (Signed)
Spoke with pt who stated O2 tanks were picked up this am. Pt is also interested in switching providers from Dr. Marchelle Gearing to Dr. Jayme Cloud as pt currently lives in [place].   Dr. Marchelle Gearing please advise.  Dr. Jayme Cloud would you be willing to take on care of pt?

## 2021-01-05 NOTE — Telephone Encounter (Signed)
Okay with me as long as it is okay with Dr. Marchelle Gearing.

## 2021-01-06 NOTE — Telephone Encounter (Signed)
Lm to schedule OV with Dr. Jayme Cloud.

## 2021-01-06 NOTE — Progress Notes (Signed)
RF is 14 and is upper limit of normal. I will call this normal. Quantiferon Gold and other serology and ESR all normal. Let patient know because these negative results are clinically meaningful

## 2021-01-06 NOTE — Telephone Encounter (Signed)
Yes this is fine.

## 2021-01-07 ENCOUNTER — Ambulatory Visit (INDEPENDENT_AMBULATORY_CARE_PROVIDER_SITE_OTHER): Payer: BC Managed Care – PPO | Admitting: Cardiology

## 2021-01-07 ENCOUNTER — Encounter: Payer: Self-pay | Admitting: Cardiology

## 2021-01-07 ENCOUNTER — Other Ambulatory Visit: Payer: Self-pay

## 2021-01-07 VITALS — BP 128/78 | HR 72 | Ht 63.0 in | Wt 203.2 lb

## 2021-01-07 DIAGNOSIS — I442 Atrioventricular block, complete: Secondary | ICD-10-CM | POA: Diagnosis not present

## 2021-01-07 MED ORDER — METOPROLOL SUCCINATE ER 200 MG PO TB24: 200.0000 mg | ORAL_TABLET | Freq: Every day | ORAL | 11 refills | Status: DC

## 2021-01-07 NOTE — Patient Instructions (Signed)
Medication Instructions:  Your physician has recommended you make the following change in your medication:  1. STOP Carvedilol 2. START Toprol 200 mg once daily  *If you need a refill on your cardiac medications before your next appointment, please call your pharmacy*   Lab Work: None ordered   Testing/Procedures: None ordered   Follow-Up: At [facility], you and your health needs are our priority.  As part of our continuing mission to provide you with exceptional heart care, we have created designated Provider Care Teams.  These Care Teams include your primary Cardiologist (physician) and Advanced Practice Providers (APPs -  Physician Assistants and Nurse Practitioners) who all work together to provide you with the care you need, when you need it.    Remote monitoring is used to monitor your Pacemaker or ICD from home. This monitoring reduces the number of office visits required to check your device to one time per year. It allows [place] to keep an eye on the functioning of your device to ensure it is working properly. You are scheduled for a device check from home on 03/12/2021. You may send your transmission at any time that day. If you have a wireless device, the transmission will be sent automatically. After your physician reviews your transmission, you will receive a postcard with your next transmission date.  Your next appointment:   1 year(s)  The format for your next appointment:   In Person  Provider:   Loman Brooklyn, MD   Thank you for choosing [facility]!!   Dory Horn, RN [phone]    Other Instructions  Metoprolol Extended-Release Tablets What is this medicine? METOPROLOL (me TOE proe lole) is a beta blocker. It decreases the amount of work your heart has to do and helps your heart beat regularly. It treats high blood pressure and/or prevent chest pain (also called angina). It also treats heart failure. This medicine may be used for other purposes;  ask your health care provider or pharmacist if you have questions. COMMON BRAND NAME(S): toprol, Toprol XL What should I tell my health care provider before I take this medicine? They need to know if you have any of these conditions:  diabetes  heart or vessel disease like slow heart rate, worsening heart failure, heart block, sick sinus syndrome or Raynaud's disease  kidney disease  liver disease  lung or breathing disease, like asthma or emphysema  pheochromocytoma  thyroid disease  an unusual or allergic reaction to metoprolol, other beta-blockers, medicines, foods, dyes, or preservatives  pregnant or trying to get pregnant  breast-feeding How should I use this medicine? Take this drug by mouth. Take it as directed on the prescription label at the same time every day. Take it with food. You may cut the tablet in half if it is scored (has a line in the middle of it). This may help you swallow the tablet if the whole tablet is too big. Be sure to take both halves. Do not take just one-half of the tablet. Keep taking it unless your health care provider tells you to stop. Talk to your health care provider about the use of this drug in children. While it may be prescribed for children as young as 6 for selected conditions, precautions do apply. Overdosage: If you think you have taken too much of this medicine contact a poison control center or emergency room at once. NOTE: This medicine is only for you. Do not share this medicine with others. What if I miss  a dose? If you miss a dose, take it as soon as you can. If it is almost time for your next dose, take only that dose. Do not take double or extra doses. What may interact with this medicine? This medicine may interact with the following medications:  certain medicines for blood pressure, heart disease, irregular heart beat  certain medicines for depression, like monoamine oxidase (MAO) inhibitors, fluoxetine, or  paroxetine  clonidine  dobutamine  epinephrine  isoproterenol  reserpine This list may not describe all possible interactions. Give your health care provider a list of all the medicines, herbs, non-prescription drugs, or dietary supplements you use. Also tell them if you smoke, drink alcohol, or use illegal drugs. Some items may interact with your medicine. What should I watch for while using this medicine? Visit your doctor or health care professional for regular check ups. Contact your doctor right away if your symptoms worsen. Check your blood pressure and pulse rate regularly. Ask your health care professional what your blood pressure and pulse rate should be, and when you should contact them. You may get drowsy or dizzy. Do not drive, use machinery, or do anything that needs mental alertness until you know how this medicine affects you. Do not sit or stand up quickly, especially if you are an older patient. This reduces the risk of dizzy or fainting spells. Contact your doctor if these symptoms continue. Alcohol may interfere with the effect of this medicine. Avoid alcoholic drinks. This medicine may increase blood sugar. Ask your healthcare provider if changes in diet or medicines are needed if you have diabetes. What side effects may I notice from receiving this medicine? Side effects that you should report to your doctor or health care professional as soon as possible:  allergic reactions like skin rash, itching or hives  cold or numb hands or feet  depression  difficulty breathing  faint  fever with sore throat  irregular heartbeat, chest pain  rapid weight gain  signs and symptoms of high blood sugar such as being more thirsty or hungry or having to urinate more than normal. You may also feel very tired or have blurry vision.  swollen legs or ankles Side effects that usually do not require medical attention (report to your doctor or health care professional if they  continue or are bothersome):  anxiety or nervousness  change in sex drive or performance  dry skin  headache  nightmares or trouble sleeping  short term memory loss  stomach upset or diarrhea This list may not describe all possible side effects. Call your doctor for medical advice about side effects. You may report side effects to FDA at 1-800-FDA-1088. Where should I keep my medicine? Keep out of the reach of children and pets. Store at room temperature between 20 and 25 degrees C (68 and 77 degrees F). Throw away any unused drug after the expiration date. NOTE: This sheet is a summary. It may not cover all possible information. If you have questions about this medicine, talk to your doctor, pharmacist, or health care provider.  2021 Elsevier/Gold Standard (2019-04-19 18:23:00)

## 2021-01-07 NOTE — Progress Notes (Signed)
Electrophysiology Office Note   Date:  01/07/2021   ID:  Anne Rush, DOB 22-Oct-1960, MRN [id number]  PCP:  [name], MD  Primary Electrophysiologist:  Kinsey Cowsert Jorja Loa, MD    No chief complaint on file.    History of Present Illness: Anne Rush is a 60 y.o. female who presents today for electrophysiology evaluation.     She presented to the hospital with complete heart block.  She is now status post Saint Jude dual-chamber pacemaker implanted 02/26/2016.  She also has a history of hypertension.  She had an episode of sepsis last fall.  This was due to a kidney stone and urinary infection.  Since then, she continues to have some joint and muscle weakness.  She also has some mild memory issues.  She does state that her blood pressure has been well controlled since her sepsis.  Today, denies symptoms of palpitations, chest pain, shortness of breath, orthopnea, PND, lower extremity edema, claudication, dizziness, presyncope, syncope, bleeding, or neurologic sequela. The patient is tolerating medications without difficulties.  She is also been having some fatigue.  She is able to do most of her daily activities, but does note that she sleeps more often.   Past Medical History:  Diagnosis Date  . Chronic systolic CHF (congestive heart failure) ([facility]) 07/15/2020  . Complete heart block ([facility]) 02/26/2016   s/p Pacemaker  . Family history of ovarian cancer   . Generalized anxiety disorder 07/15/2020  . GERD (gastroesophageal reflux disease)   . History of kidney stones   . Hyperlipidemia   . Hypertension   . Interstitial lung disease ([facility])   . Pollen allergies   . Presence of permanent cardiac pacemaker   . Squamous cell carcinoma 2014   skin cancer  . Subclinical hyperthyroidism 06/17/2014   Past Surgical History:  Procedure Laterality Date  . CESAREAN SECTION    . COSMETIC SURGERY     skin cancer face  . CYSTOSCOPY W/ URETERAL STENT PLACEMENT Left 07/15/2020    Procedure: CYSTOSCOPY WITH RETROGRADE PYELOGRAM/URETERAL STENT PLACEMENT;  Surgeon: Anne Altes, MD;  Location: [facility];  Service: Urology;  Laterality: Left;  . CYSTOSCOPY/URETEROSCOPY/HOLMIUM LASER/STENT PLACEMENT Left 07/28/2020   Procedure: CYSTOSCOPY/URETEROSCOPY/HOLMIUM LASER/STENT Exchange;  Surgeon: Anne Scotland, MD;  Location: [facility];  Service: Urology;  Laterality: Left;  . EP IMPLANTABLE DEVICE N/A 02/26/2016   Procedure: Pacemaker Implant;  Surgeon: Anne Guilfoil Jorja Loa, MD;  [facility];  Service: Cardiovascular;  Laterality: N/A;  . TUBAL LIGATION       Current Outpatient Medications  Medication Sig Dispense Refill  . albuterol (VENTOLIN HFA) 108 (90 Base) MCG/ACT inhaler Inhale 1-2 puffs into the lungs every 6 (six) hours as needed for wheezing or shortness of breath. 18 g 2  . carvedilol (COREG) 25 MG tablet Take 1 tablet (25 mg total) by mouth 2 (two) times daily with a meal. 180 tablet 1  . diazepam (VALIUM) 5 MG tablet TAKE ONE TABLET BY MOUTH EVERY 8 HOURS AS NEEDED FOR ANXIETY 30 tablet 3  . doxycycline (VIBRA-TABS) 100 MG tablet Take 1 tablet (100 mg total) by mouth 2 (two) times daily. Extend additional 7 days (Patient not taking: Reported on 01/07/2021) 14 tablet 0  . famotidine (PEPCID) 20 MG tablet Take 1 tablet (20 mg total) by mouth daily. 30 tablet 0  . spironolactone (ALDACTONE) 25 MG tablet TAKE ONE TABLET BY MOUTH DAILY 90 tablet 3   No current facility-administered medications for this visit.  Facility-Administered Medications Ordered in Other Visits  Medication Dose Route Frequency Provider Last Rate Last Admin  . albuterol (PROVENTIL) (2.5 MG/3ML) 0.083% nebulizer solution 2.5 mg  2.5 mg Nebulization Once Anne Shan, MD        Allergies:   Codeine, Sulfonamide derivatives, Paroxetine, Lisinopril, Losartan potassium, Neomycin-polymyxin-dexameth, Oxycodone-acetaminophen, and Sulfa antibiotics   Social History:  The patient   reports that she quit smoking about 10 years ago. She has a 15.00 pack-year smoking history. She has never used smokeless tobacco. She reports that she does not drink alcohol and does not use drugs.   Family History:  The patient's family history includes Anxiety disorder in her mother; Cancer in her maternal aunt; Heart failure in her maternal grandfather; Hypertension in her mother; Ovarian cancer (age of onset: 24) in her mother.   ROS:  Please see the history of present illness.   Otherwise, review of systems is positive for none.   All other systems are reviewed and negative.   PHYSICAL EXAM: VS:  BP 128/78   Pulse 72   Ht 5\' 3"  (1.6 m)   Wt 203 lb 3.2 oz (92.2 kg)   SpO2 97%   BMI 36.00 kg/m  , BMI Body mass index is 36 kg/m. GEN: Well nourished, well developed, in no acute distress  HEENT: normal  Neck: no JVD, carotid bruits, or masses Cardiac: RRR; no murmurs, rubs, or gallops,no edema  Respiratory:  clear to auscultation bilaterally, normal work of breathing GI: soft, nontender, nondistended, + BS MS: no deformity or atrophy  Skin: warm and dry, device site well healed Neuro:  Strength and sensation are intact Psych: euthymic mood, full affect  EKG:  EKG is ordered today. Personal review of the ekg ordered shows atrial sensed, ventricular paced  Personal review of the device interrogation today. Results in [facility]   Recent Labs: 06/21/2020: B Natriuretic Peptide 171.5 07/15/2020: Magnesium 1.5 10/13/2020: ALT 11; BUN 10; Creatinine, Ser 0.60; Hemoglobin 14.3; Platelets 225.0; Potassium 4.3; Sodium 138; TSH 2.71    Lipid Panel     Component Value Date/Time   CHOL 228 (H) 10/13/2020 1111   TRIG 285.0 (H) 10/13/2020 1111   HDL 48.40 10/13/2020 1111   CHOLHDL 5 10/13/2020 1111   VLDL 57.0 (H) 10/13/2020 1111   LDLCALC 80 07/16/2020 0521   LDLDIRECT 159.0 10/13/2020 1111     Wt Readings from Last 3 Encounters:  01/07/21 203 lb 3.2 oz (92.2 kg)  01/01/21 203 lb  (92.1 kg)  10/16/20 201 lb 12 oz (91.5 kg)      Other studies Reviewed: Additional studies/ records that were reviewed today include: TTE 03/29/17 Review of the above records today demonstrates:  - Left ventricle: The cavity size was normal. Wall thickness was   increased in a pattern of mild LVH. Systolic function was normal.   The estimated ejection fraction was in the range of 50% to 55%.   Apical septal hypokinesis. Doppler parameters are consistent with   abnormal left ventricular relaxation (grade 1 diastolic   dysfunction). The E/e&' ratio is between 8-15, suggesting   indeterminate LV filling pressure. - Left atrium: The atrium was normal in size. - Tricuspid valve: There was mild regurgitation. - Pulmonary arteries: PA peak pressure: 27 mm Hg (S). - Inferior vena cava: The vessel was normal in size. The   respirophasic diameter changes were in the normal range (>= 50%),   consistent with normal central venous pressure.   ASSESSMENT AND PLAN:  1.  Complete heart block: Status post Saint Jude dual-chamber pacemaker implanted 02/27/2016.  Device functioning appropriately.  No changes.      2.  Hypertension: Blood pressure currently well controlled.  She had an episode of sepsis last fall and has been well controlled since then.  She is having some fatigue.  We Ellwood Steidle stop carvedilol and start Toprol-XL 200 mg.    3.  Mild systolic heart failure, chronic: Currently on carvedilol.  Switching to Toprol-XL.  No other changes.  Current medicines are reviewed at length with the patient today.   The patient does not have concerns regarding her medicines.  The following changes were made today: None  Labs/ tests ordered today include:  No orders of the defined types were placed in this encounter.    Disposition:   FU with Kazaria Gaertner 12 months  Signed, Cree Kunert Jorja Loa, MD  01/07/2021 4:14 PM     [facility] [address] [place]  [address] [phone] (office) [phone] (fax)

## 2021-01-08 ENCOUNTER — Telehealth: Payer: Self-pay | Admitting: Internal Medicine

## 2021-01-08 NOTE — Telephone Encounter (Signed)
Please see 01/05/2021 phone note.  Patient has pending appt with Dr. Jayme Cloud on 03/11/2021. Nothing further needed at this time.

## 2021-01-08 NOTE — Telephone Encounter (Signed)
Lm x2 for patient.  Will close encounter per office protocol.   

## 2021-01-09 NOTE — Addendum Note (Signed)
Addended by: Baird Lyons on: 01/09/2021 07:47 AM   Modules accepted: Orders

## 2021-01-14 ENCOUNTER — Ambulatory Visit: Payer: BC Managed Care – PPO | Admitting: Family Medicine

## 2021-01-16 MED ORDER — CARVEDILOL 25 MG PO TABS: 25.0000 mg | ORAL_TABLET | Freq: Two times a day (BID) | ORAL | 3 refills | Status: DC

## 2021-01-16 NOTE — Telephone Encounter (Signed)
Dr. Elberta Fortis agreeable to switch back to Carvedilol.   Rx sent to pharmacy. Will forward note to device clinic to answer pt question on [facility]

## 2021-01-21 ENCOUNTER — Ambulatory Visit (INDEPENDENT_AMBULATORY_CARE_PROVIDER_SITE_OTHER): Payer: BC Managed Care – PPO | Admitting: Family Medicine

## 2021-01-21 ENCOUNTER — Encounter: Payer: Self-pay | Admitting: Family Medicine

## 2021-01-21 ENCOUNTER — Other Ambulatory Visit: Payer: Self-pay

## 2021-01-21 VITALS — HR 74 | Temp 97.9°F | Ht 62.5 in | Wt 207.0 lb

## 2021-01-21 DIAGNOSIS — R5383 Other fatigue: Secondary | ICD-10-CM

## 2021-01-21 DIAGNOSIS — J849 Interstitial pulmonary disease, unspecified: Secondary | ICD-10-CM

## 2021-01-21 DIAGNOSIS — M2550 Pain in unspecified joint: Secondary | ICD-10-CM | POA: Diagnosis not present

## 2021-01-21 DIAGNOSIS — I5022 Chronic systolic (congestive) heart failure: Secondary | ICD-10-CM

## 2021-01-21 DIAGNOSIS — Z28310 Unvaccinated for covid-19: Secondary | ICD-10-CM | POA: Diagnosis not present

## 2021-01-21 DIAGNOSIS — Z2821 Immunization not carried out because of patient refusal: Secondary | ICD-10-CM

## 2021-01-21 NOTE — Progress Notes (Signed)
  Spencer T. Copland, MD, CAQ Sports Medicine  Primary Care and Sports Medicine [facility] [address] [place], [address]  Phone: [phone]  FAX: [phone]  Anne Rush - 60 y.o. female  MRN [id number]  Date of Birth: 04/14/1961  Date: 01/21/2021  PCP: Copland, Spencer, MD  Referral: Copland, Spencer, MD  Chief Complaint  Patient presents with  . Muscle Fatigue  . Joint Pain    This visit occurred during the SARS-CoV-2 public health emergency.  Safety protocols were in place, including screening questions prior to the visit, additional usage of staff PPE, and extensive cleaning of exam room while observing appropriate contact time as indicated for disinfecting solutions.   Subjective:   Anne Rush is a 60 y.o. very pleasant female patient with Body mass index is 37.26 kg/m. who presents with the following:  She presents today with generalized fatigue and aches and pains.  She feels as if these have all gotten worse since her hospitalization in the fall for sepsis.  She was hospitalized for about 1 week.  Toprol now instead of Coreg.  Did not do anything at all, so she switched back to Coreg.  Pulmonary and Cardiology has discussed with them. Hurting in general.  Does not thing that anything happened until after she was in the last hosp.  Sleeps all the time. Wants to sleep all the time.  Crying -in the office today  Not all that active, she is limited from a physical standpoint she can only walk for about 10 minutes.  She has global joint aches and pains. She has had multiple rheumatological work-ups in the past, and these have been normal.  Specifically very recently her sed rate, CRP, ANA and CCP antibodies were all been normal.  These have all been normal on prior occasions as well.  Cardiac / Pulmonary rehab?? - ask about if no vaccine  She does have pulmonary fibrosis, and she declines my recommendation for  COVID-19 vaccination against  Review of Systems is noted in the HPI, as appropriate  Objective:   Pulse 74   Temp 97.9 F (36.6 C) (Temporal)   Ht 5' 2.5" (1.588 m)   Wt 207 lb (93.9 kg)   SpO2 96%   BMI 37.26 kg/m   GEN: No acute distress; alert,appropriate. PULM: Breathing comfortably in no respiratory distress PSYCH: Normally interactive.  CV: RRR, no m/g/r  PULM: Normal respiratory rate, no accessory muscle use. No wheezes, crackles or rhonchi on my exam.  Laboratory and Imaging Data: Lab Review:  CBC EXTENDED Latest Ref Rng & Units 10/13/2020 08/06/2020 07/20/2020  WBC 4.0 - 10.5 K/uL 6.0 5.9 13.4(H)  RBC 3.87 - 5.11 Mil/uL 4.63 4.16 3.69(L)  HGB 12.0 - 15.0 g/dL 14.3 13.3 11.9(L)  HCT 36.0 - 46.0 % 41.4 38.8 34.2(L)  PLT 150.0 - 400.0 K/uL 225.0 237.0 219  NEUTROABS 1.4 - 7.7 K/uL 3.6 4.0 -  LYMPHSABS 0.7 - 4.0 K/uL 1.8 1.3 -    BMP Latest Ref Rng & Units 10/13/2020 08/06/2020 07/19/2020  Glucose 70 - 99 mg/dL 95 103(H) 95  BUN 6 - 23 mg/dL 10 8 16  Creatinine 0.40 - 1.20 mg/dL 0.60 0.60 0.52  Sodium 135 - 145 mEq/L 138 139 138  Potassium 3.5 - 5.1 mEq/L 4.3 4.4 3.4(L)  Chloride 96 - 112 mEq/L 101 102 102  CO2 19 - 32 mEq/L 30 30 26  Calcium 8.4 - 10.5 mg/dL 9.8 9.3 8.9      Hepatic Function Latest Ref Rng & Units 10/13/2020 07/15/2020 07/13/2020  Total Protein 6.0 - 8.3 g/dL 7.3 6.8 7.3  Albumin 3.5 - 5.2 g/dL 4.7 3.4(L) 4.3  AST 0 - 37 U/L 15 26 22  ALT 0 - 35 U/L 11 15 20  Alk Phosphatase 39 - 117 U/L 51 51 37(L)  Total Bilirubin 0.2 - 1.2 mg/dL 0.5 1.7(H) 1.5(H)  Bilirubin, Direct 0.0 - 0.3 mg/dL 0.1 - -    Lab Results  Component Value Date   CHOL 228 (H) 10/13/2020   Lab Results  Component Value Date   HDL 48.40 10/13/2020   Lab Results  Component Value Date   LDLCALC 80 07/16/2020   Lab Results  Component Value Date   TRIG 285.0 (H) 10/13/2020   Lab Results  Component Value Date   CHOLHDL 5 10/13/2020   No results for input(s): PSA in  the last 72 hours. No results found for: HCVAB Lab Results  Component Value Date   VD25OH 25.80 (L) 10/13/2020   VD25OH 27.26 (L) 04/13/2019   VD25OH 19.66 (L) 01/09/2018     Lab Results  Component Value Date   HGBA1C 5.8 10/13/2020   HGBA1C 6.2 (H) 07/16/2020   HGBA1C 5.6 04/05/2020   Lab Results  Component Value Date   LDLCALC 80 07/16/2020   CREATININE 0.60 10/13/2020   Results for orders placed or performed in visit on 01/01/21  QuantiFERON-TB Gold Plus  Result Value Ref Range   QuantiFERON-TB Gold Plus NEGATIVE NEGATIVE   NIL 0.02 IU/mL   Mitogen-NIL >10.00 IU/mL   TB1-NIL 0.00 IU/mL   TB2-NIL 0.00 IU/mL  Cyclic citrul peptide antibody, IgG  Result Value Ref Range   Cyclic Citrullin Peptide Ab <16 UNITS  Rheumatoid Factor  Result Value Ref Range   Rhuematoid fact SerPl-aCnc 14 (H) <14 IU/mL  Antinuclear Antib (ANA)  Result Value Ref Range   Anti Nuclear Antibody (ANA) NEGATIVE NEGATIVE  Sed Rate (ESR)  Result Value Ref Range   Sed Rate 10 0 - 30 mm/hr     Assessment and Plan:     ICD-10-CM   1. Other fatigue  R53.83   2. Polyarthralgia  M25.50   3. COVID-19 vaccine series declined  Z28.310   4. Interstitial lung disease (HCC)  J84.9   5. Chronic systolic CHF (congestive heart failure) (HCC)  I50.22    Total encounter time: 30 minutes. This includes total time spent on the day of encounter.  Additional time spent on chart review and study review.  Anne Rush is still having some ongoing issues fatigue, and weight gain after her hospitalization with sepsis in the fall.  She does have pulmonary fibrosis as well and she does have a history of complete heart block with a pacemaker.  She has been over recent years compliant.  Unfortunately, she has not wanted to get a COVID-19 vaccine.  She is feeling fatigued now much of the time, and her basic exercise and quality of life now is worsened.  She certainly has pathology is very significant, but she did not talk about  it, and I think that doing some cardiopulmonary rehab would probably be of benefit.  She has had multiple rheumatological work-ups in the past.  Rheumatoid factor of 14 does not clinically mean anything.  Signed,  Spencer T. Copland, MD   Outpatient Encounter Medications as of 01/21/2021  Medication Sig  . albuterol (VENTOLIN HFA) 108 (90 Base) MCG/ACT inhaler Inhale 1-2 puffs into the lungs every 6 (six)   hours as needed for wheezing or shortness of breath.  . carvedilol (COREG) 25 MG tablet Take 1 tablet (25 mg total) by mouth 2 (two) times daily.  . diazepam (VALIUM) 5 MG tablet TAKE ONE TABLET BY MOUTH EVERY 8 HOURS AS NEEDED FOR ANXIETY  . doxycycline (VIBRA-TABS) 100 MG tablet Take 1 tablet (100 mg total) by mouth 2 (two) times daily. Extend additional 7 days  . famotidine (PEPCID) 20 MG tablet Take 1 tablet (20 mg total) by mouth daily.  [name] spironolactone (ALDACTONE) 25 MG tablet TAKE ONE TABLET BY MOUTH DAILY   Facility-Administered Encounter Medications as of 01/21/2021  Medication  . albuterol (PROVENTIL) (2.5 MG/3ML) 0.083% nebulizer solution 2.5 mg

## 2021-02-18 ENCOUNTER — Telehealth: Payer: Self-pay | Admitting: Pulmonary Disease

## 2021-02-18 NOTE — Telephone Encounter (Signed)
I have called the pt and she is aware that she will need to call [facility] and speak with someone in medical records to see if they can fax or email her the PFT results that she is needing.

## 2021-02-24 DIAGNOSIS — D2272 Melanocytic nevi of left lower limb, including hip: Secondary | ICD-10-CM | POA: Diagnosis not present

## 2021-02-24 DIAGNOSIS — L820 Inflamed seborrheic keratosis: Secondary | ICD-10-CM | POA: Diagnosis not present

## 2021-02-24 DIAGNOSIS — L538 Other specified erythematous conditions: Secondary | ICD-10-CM | POA: Diagnosis not present

## 2021-02-24 DIAGNOSIS — D2262 Melanocytic nevi of left upper limb, including shoulder: Secondary | ICD-10-CM | POA: Diagnosis not present

## 2021-02-24 DIAGNOSIS — X32XXXA Exposure to sunlight, initial encounter: Secondary | ICD-10-CM | POA: Diagnosis not present

## 2021-02-24 DIAGNOSIS — D2261 Melanocytic nevi of right upper limb, including shoulder: Secondary | ICD-10-CM | POA: Diagnosis not present

## 2021-02-24 DIAGNOSIS — L570 Actinic keratosis: Secondary | ICD-10-CM | POA: Diagnosis not present

## 2021-02-24 DIAGNOSIS — Z85828 Personal history of other malignant neoplasm of skin: Secondary | ICD-10-CM | POA: Diagnosis not present

## 2021-03-03 ENCOUNTER — Ambulatory Visit: Payer: BC Managed Care – PPO | Admitting: Urology

## 2021-03-03 ENCOUNTER — Ambulatory Visit
Admission: RE | Admit: 2021-03-03 | Discharge: 2021-03-03 | Disposition: A | Discharge status: Home / Self Care | Payer: BC Managed Care – PPO | Attending: Urology | Admitting: Urology

## 2021-03-03 ENCOUNTER — Ambulatory Visit
Admission: RE | Admit: 2021-03-03 | Discharge: 2021-03-03 | Disposition: A | Discharge status: Home / Self Care | Payer: BC Managed Care – PPO | Source: Ambulatory Visit | Attending: Urology | Admitting: Urology

## 2021-03-03 ENCOUNTER — Other Ambulatory Visit: Payer: Self-pay

## 2021-03-03 VITALS — BP 125/82 | HR 86 | Ht 63.0 in | Wt 205.0 lb

## 2021-03-03 DIAGNOSIS — Z87442 Personal history of urinary calculi: Secondary | ICD-10-CM | POA: Diagnosis not present

## 2021-03-03 DIAGNOSIS — N200 Calculus of kidney: Secondary | ICD-10-CM | POA: Diagnosis not present

## 2021-03-03 DIAGNOSIS — N201 Calculus of ureter: Secondary | ICD-10-CM

## 2021-03-03 DIAGNOSIS — N2889 Other specified disorders of kidney and ureter: Secondary | ICD-10-CM | POA: Diagnosis not present

## 2021-03-03 NOTE — Progress Notes (Signed)
03/03/2021 3:59 PM   Anne Rush [birth year] [id number]  Referring provider: [name], MD [address] [place],  [place] [address]  Chief Complaint  Patient presents with   Nephrolithiasis    HPI: 60 year old female who returns today for follow-up of kidney stones.  She was last seen 6 months ago after undergoing left ureteroscopy for septic stone event.  Stone composition primarily struvite.  Renal ultrasound did show some nonobstructing bilateral stone burden.  This was felt to be possibly related to stone debris.  No issues with UTIs since last visit.  She follows up today with a KUB.   This shows no stone burden bilaterally.  The images were personally reviewed today.  She reports that since developing severe sepsis, she has noticed a few things.  She has issues with memory and attention which she never previously had.  She symptoms also has occasional bilateral lower abdominal pain with ambulation of unclear etiology.  She also reports that her blood pressure is normalized for unclear reasons.  She denies any overt flank pain.   PMH: Past Medical History:  Diagnosis Date   Chronic systolic CHF (congestive heart failure) (HCC) 07/15/2020   Complete heart block (HCC) 02/26/2016   s/p Pacemaker   Family history of ovarian cancer    Generalized anxiety disorder 07/15/2020   GERD (gastroesophageal reflux disease)    History of kidney stones    Hyperlipidemia    Hypertension    Interstitial lung disease (HCC)    Pollen allergies    Presence of permanent cardiac pacemaker    Squamous cell carcinoma 2014   skin cancer   Subclinical hyperthyroidism 06/17/2014    Surgical History: Past Surgical History:  Procedure Laterality Date   CESAREAN SECTION     COSMETIC SURGERY     skin cancer face   CYSTOSCOPY W/ URETERAL STENT PLACEMENT Left 07/15/2020   Procedure: CYSTOSCOPY WITH RETROGRADE PYELOGRAM/URETERAL STENT PLACEMENT;  Surgeon: Riki Altes, MD;  Location: [facility] ORS;  Service: Urology;  Laterality: Left;   CYSTOSCOPY/URETEROSCOPY/HOLMIUM LASER/STENT PLACEMENT Left 07/28/2020   Procedure: CYSTOSCOPY/URETEROSCOPY/HOLMIUM LASER/STENT Exchange;  Surgeon: Vanna Scotland, MD;  Location: [facility] ORS;  Service: Urology;  Laterality: Left;   EP IMPLANTABLE DEVICE N/A 02/26/2016   Procedure: Pacemaker Implant;  Surgeon: Will Jorja Loa, MD;  [facility];  Service: Cardiovascular;  Laterality: N/A;   TUBAL LIGATION      Home Medications:  Allergies as of 03/03/2021       Reactions   Codeine Hives, Itching   Sulfonamide Derivatives Hives   Paroxetine Other (See Comments)   unknown   Lisinopril Rash   Losartan Potassium Other (See Comments)   Leg felt heavy   Neomycin-polymyxin-dexameth Itching, Swelling, Other (See Comments)   Ey drop/Swelling and itching of eye    Oxycodone-acetaminophen Other (See Comments)   Mild SOB, feels funny   Sulfa Antibiotics Hives, Rash           Medication List        Accurate as of March 03, 2021  3:59 PM. If you have any questions, ask your nurse or doctor.          albuterol 108 (90 Base) MCG/ACT inhaler Commonly known as: Ventolin HFA Inhale 1-2 puffs into the lungs every 6 (six) hours as needed for wheezing or shortness of breath.   carvedilol 25 MG tablet Commonly known as: COREG Take 1 tablet (25 mg total) by mouth 2 (two) times daily.  diazepam 5 MG tablet Commonly known as: VALIUM TAKE ONE TABLET BY MOUTH EVERY 8 HOURS AS NEEDED FOR ANXIETY   doxycycline 100 MG tablet Commonly known as: VIBRA-TABS Take 1 tablet (100 mg total) by mouth 2 (two) times daily. Extend additional 7 days   famotidine 20 MG tablet Commonly known as: PEPCID Take 1 tablet (20 mg total) by mouth daily.   spironolactone 25 MG tablet Commonly known as: ALDACTONE TAKE ONE TABLET BY MOUTH DAILY        Allergies:  Allergies  Allergen Reactions   Codeine Hives and Itching    Sulfonamide Derivatives Hives   Paroxetine Other (See Comments)    unknown   Lisinopril Rash   Losartan Potassium Other (See Comments)    Leg felt heavy   Neomycin-Polymyxin-Dexameth Itching, Swelling and Other (See Comments)    Ey drop/Swelling and itching of eye    Oxycodone-Acetaminophen Other (See Comments)    Mild SOB, feels funny   Sulfa Antibiotics Hives and Rash         Family History: Family History  Problem Relation Age of Onset   Anxiety disorder Mother    Hypertension Mother    Ovarian cancer Mother 44   Cancer Maternal Aunt        breast   Heart failure Maternal Grandfather     Social History:  reports that she quit smoking about 10 years ago. She has a 15.00 pack-year smoking history. She has never used smokeless tobacco. She reports that she does not drink alcohol and does not use drugs.   Physical Exam: BP 125/82   Pulse 86   Ht 5\' 3"  (1.6 m)   Wt 205 lb (93 kg)   BMI 36.31 kg/m   Constitutional:  Alert and oriented, No acute distress. HEENT: [place] AT, moist mucus membranes.  Trachea midline, no masses. Cardiovascular: No clubbing, cyanosis, or edema. Respiratory: Normal respiratory effort, no increased work of breathing. Skin: No rashes, bruises or suspicious lesions. Neurologic: Grossly intact, no focal deficits, moving all 4 extremities. Psychiatric: Normal mood and affect.  Laboratory Data: Lab Results  Component Value Date   WBC 6.0 10/13/2020   HGB 14.3 10/13/2020   HCT 41.4 10/13/2020   MCV 89.4 10/13/2020   PLT 225.0 10/13/2020    Lab Results  Component Value Date   CREATININE 0.60 10/13/2020    Pertinent Imaging: KUB personally reviewed today, interpretation as above  Assessment & Plan:    1. Struvite kidney stones Postoperative renal ultrasound concerning for possible fragmentation although intraoperatively, these were treated and she had no  clinically significant contralateral stones on her initial CT scan  Suspect this  likely was debris rather than true stone burden  KUB today is completely reassuring, no stone seen bilaterally  She remains asymptomatic in terms of infection or stones without a previous history  Of such, we will have her follow-up as needed.  She will have her primary care check a urine annually to ensure that she not having any infection as her previous UTI was asymptomatic.  If she has any signs or symptoms of stones or UTI, will return.    10/15/2020, MD  [facility] [address] [place], [place] [phone]

## 2021-03-11 ENCOUNTER — Other Ambulatory Visit: Payer: Self-pay

## 2021-03-11 ENCOUNTER — Ambulatory Visit: Payer: BC Managed Care – PPO | Admitting: Pulmonary Disease

## 2021-03-11 ENCOUNTER — Encounter: Payer: Self-pay | Admitting: Pulmonary Disease

## 2021-03-11 VITALS — BP 128/76 | HR 81 | Temp 97.7°F | Ht 63.0 in | Wt 208.6 lb

## 2021-03-11 DIAGNOSIS — R0681 Apnea, not elsewhere classified: Secondary | ICD-10-CM

## 2021-03-11 DIAGNOSIS — K219 Gastro-esophageal reflux disease without esophagitis: Secondary | ICD-10-CM

## 2021-03-11 DIAGNOSIS — Z9189 Other specified personal risk factors, not elsewhere classified: Secondary | ICD-10-CM

## 2021-03-11 DIAGNOSIS — R0602 Shortness of breath: Secondary | ICD-10-CM | POA: Diagnosis not present

## 2021-03-11 DIAGNOSIS — J849 Interstitial pulmonary disease, unspecified: Secondary | ICD-10-CM | POA: Diagnosis not present

## 2021-03-11 MED ORDER — ESOMEPRAZOLE MAGNESIUM 40 MG PO CPDR: 40.0000 mg | DELAYED_RELEASE_CAPSULE | Freq: Every day | ORAL | 4 refills | Status: DC

## 2021-03-11 NOTE — Patient Instructions (Signed)
We are going to schedule a sleep study.  This will be done in the lab as we will have to test to issues with your sleep.  We are going to get an echocardiogram to evaluate the artery that goes from the heart to the lungs to make sure that is not under high pressure.  Please make an appointment with your primary doctor so they can evaluate the pain you are having in the lower part of your abdomen.  We are going to get full breathing tests.  We will see him in follow-up in 4 to 6 weeks time you will see me or one of our nurse practitioners at that time.

## 2021-03-11 NOTE — Progress Notes (Signed)
Subjective:    Patient ID: Anne Rush, female    DOB: 02/02/1961, 60 y.o.   MRN: [id number] Chief Complaint  Patient presents with   Follow-up    C/o sob with exertion.    HPI  The patient is a 60 year old former smoker (quit 2012, 15-pack-year history) who presents for follow-up on the issue of interstitial lung disease and dyspnea.  This is a scheduled visit.  She has been previously followed by Dr. Marchelle Gearing, this is my first occasion to see her.  She was diagnosed with ILD in 2012, at that time she quit smoking.  Her ILD is clinically not UIP/IPF.  She has declined biopsies or invasive procedures previously.  She has had no evidence of autoimmune ILD.  She had prior follow-up at pulmonary clinic at [facility] and was evaluated by Dr. Sandy Salaam.  Her initial diagnosis was RB ILD and she initially improved after cessation of smoking.  Most recent CT chest was performed in December 2021 and shows findings compatible with fibrotic interstitial lung disease without frank honeycombing and with a slight upper lung predominance.  The findings at that time progress in the remote 2012 chest CT.  But appeared improved from the CT of July 2021.  [place] diagnosis includes chronic hypersensitivity pneumonitis, fibrotic NSIP/postinflammatory fibrosis.  Again, not consistent with UIP/IPF.  Her main complaint today is that of fatigue during the day.  She does not describe it as dyspnea per se.  She finds that she has nonrestorative sleep.  She also has been told by her husband that she makes "strange noises" during sleep.  There are apneic episodes described by her husband.  She has a history of complete heart block.  She has pacer in place.  Last echocardiogram was in July 2021 and showed decrease LVEF to 45 to 50% with global hypokinesis and grade I DD.  She does not endorse fevers, chills or sweats.  Has chronic issues with orthopnea. Mild lower extremity edema at the end of the day.  Usually resolves with  elevating lower extremities.  Does have severe gastroesophageal reflux symptoms with occasional choking episodes at night.   Review of Systems A 10 point review of systems was performed and it is as noted above otherwise negative.  Past Medical History:  Diagnosis Date   Chronic systolic CHF (congestive heart failure) (HCC) 07/15/2020   Complete heart block (HCC) 02/26/2016   s/p Pacemaker   Family history of ovarian cancer    Generalized anxiety disorder 07/15/2020   GERD (gastroesophageal reflux disease)    History of kidney stones    Hyperlipidemia    Hypertension    Interstitial lung disease (HCC)    Pollen allergies    Presence of permanent cardiac pacemaker    Squamous cell carcinoma 2014   skin cancer   Subclinical hyperthyroidism 06/17/2014   Past Surgical History:  Procedure Laterality Date   CESAREAN SECTION     COSMETIC SURGERY     skin cancer face   CYSTOSCOPY W/ URETERAL STENT PLACEMENT Left 07/15/2020   Procedure: CYSTOSCOPY WITH RETROGRADE PYELOGRAM/URETERAL STENT PLACEMENT;  Surgeon: Riki Altes, MD;  Location: [facility] ORS;  Service: Urology;  Laterality: Left;   CYSTOSCOPY/URETEROSCOPY/HOLMIUM LASER/STENT PLACEMENT Left 07/28/2020   Procedure: CYSTOSCOPY/URETEROSCOPY/HOLMIUM LASER/STENT Exchange;  Surgeon: Vanna Scotland, MD;  Location: [facility] ORS;  Service: Urology;  Laterality: Left;   EP IMPLANTABLE DEVICE N/A 02/26/2016   Procedure: Pacemaker Implant;  Surgeon: Will Jorja Loa, MD;  [facility];  Service:  Cardiovascular;  Laterality: N/A;   TUBAL LIGATION     Social History   Tobacco Use   Smoking status: Former    Packs/day: 0.50    Years: 30.00    Pack years: 15.00    Types: Cigarettes    Quit date: 2012    Years since quitting: 10.4   Smokeless tobacco: Never  Substance Use Topics   Alcohol use: No   Allergies  Allergen Reactions   Codeine Hives and Itching   Sulfonamide Derivatives Hives   Paroxetine Other (See  Comments)    unknown   Lisinopril Rash   Losartan Potassium Other (See Comments)    Leg felt heavy   Neomycin-Polymyxin-Dexameth Itching, Swelling and Other (See Comments)    Ey drop/Swelling and itching of eye    Oxycodone-Acetaminophen Other (See Comments)    Mild SOB, feels funny   Sulfa Antibiotics Hives and Rash        Current Meds  Medication Sig   albuterol (VENTOLIN HFA) 108 (90 Base) MCG/ACT inhaler Inhale 1-2 puffs into the lungs every 6 (six) hours as needed for wheezing or shortness of breath.   carvedilol (COREG) 25 MG tablet Take 1 tablet (25 mg total) by mouth 2 (two) times daily.   diazepam (VALIUM) 5 MG tablet TAKE ONE TABLET BY MOUTH EVERY 8 HOURS AS NEEDED FOR ANXIETY   famotidine (PEPCID) 20 MG tablet Take 1 tablet (20 mg total) by mouth daily.   spironolactone (ALDACTONE) 25 MG tablet TAKE ONE TABLET BY MOUTH DAILY   Immunization History  Administered Date(s) Administered   Influenza Nasal 06/20/2017   Influenza Whole 07/04/2008, 06/18/2009, 08/27/2010   Influenza, Seasonal, Injecte, Preservative Fre 09/03/2010, 06/22/2011, 06/27/2012   Influenza,inj,Quad PF,6+ Mos 07/21/2015, 07/06/2018   Influenza-Unspecified 06/27/2012, 07/17/2014, 07/19/2016, 07/21/2019   Td 05/10/2002   Tdap 10/28/2014      Objective:   Physical Exam BP 128/76 (BP Location: Left Arm, Cuff Size: Normal)   Pulse 81   Temp 97.7 F (36.5 C) (Temporal)   Ht 5\' 3"  (1.6 m)   Wt 208 lb 9.6 oz (94.6 kg)   SpO2 96%   BMI 36.95 kg/m  GENERAL: Obese woman, no acute distress.  Appears tired.  Fully ambulatory.  No conversational dyspnea. HEAD: Normocephalic, atraumatic.  EYES: Pupils equal, round, reactive to light.  No scleral icterus.  Mild periorbital edema. MOUTH: Nose/mouth/throat not examined due to masking requirements for COVID 19. NECK: Supple. No thyromegaly. Trachea midline. No JVD.  No adenopathy. PULMONARY: Good air entry bilaterally.  No adventitious sounds.  Overt  crackles. CARDIOVASCULAR: S1 and S2. Regular rate and rhythm.  Pacer dependent.  No murmurs. ABDOMEN: Obese otherwise benign. MUSCULOSKELETAL: No joint deformity, no clubbing, no edema.  NEUROLOGIC: No focal deficit, no gait disturbance, speech is fluent. SKIN: Intact,warm,dry. PSYCH: Mood and behavior normal.  Results of the Epworth flowsheet 03/11/2021  Sitting and reading 3  Watching TV 0  Sitting, inactive in a public place (e.g. a theatre or a meeting) 0  As a passenger in a car for an hour without a break 1  Lying down to rest in the afternoon when circumstances permit 3  Sitting and talking to someone 0  Sitting quietly after a lunch without alcohol 3  In a car, while stopped for a few minutes in traffic 0  Total score 10      Assessment & Plan:     ICD-10-CM   1. Shortness of breath  R06.02 ECHOCARDIOGRAM COMPLETE  Pulmonary Function Test [facility] Only   Mostly described as fatigue Endorses nonrestorative sleep Assess with full PFTs 2D echo to rule out PAH    2. ILD (interstitial lung disease) (HCC)  J84.9    Will obtain PFTs Query sarcoid (upper lobe predominant/complete heart block) If sarcoid likely "burnt out"    3. GERD with apnea  K21.9    R06.81    Esomeprazole 40 mg daily May continue taking Pepcid at nighttime Need GI referral if symptoms persist This issue adds complexity to her ILD management    4. At risk for sleep apnea  Z91.89 Split night study   Split-night sleep study Epworth scale is 10 Described apneic episodes during sleep     Orders Placed This Encounter  Procedures   Pulmonary Function Test [facility] Only    Standing Status:   Future    Standing Expiration Date:   03/11/2022    Scheduling Instructions:     62mo    Order Specific Question:   Full PFT: includes the following: basic spirometry, spirometry pre & post bronchodilator, diffusion capacity (DLCO), lung volumes    Answer:   Full PFT   ECHOCARDIOGRAM COMPLETE    Standing Status:    Future    Standing Expiration Date:   09/10/2021    Order Specific Question:   Where should this test be performed    Answer:   [facility]    Order Specific Question:   Perflutren DEFINITY (image enhancing agent) should be administered unless hypersensitivity or allergy exist    Answer:   Administer Perflutren    Order Specific Question:   Reason for exam-Echo    Answer:   Dyspnea  R06.00   Split night study    Standing Status:   Future    Standing Expiration Date:   03/11/2022    Order Specific Question:   Where should this test be performed:    Answer:      Meds ordered this encounter  Medications   esomeprazole (NEXIUM) 40 MG capsule    Sig: Take 1 capsule (40 mg total) by mouth daily at 12 noon.    Dispense:  30 capsule    Refill:  4   Discussion:  Patient's ILD appears to be relatively stable.  It has upper lobe predominance, she has had also some cardiac issues that may suggest potential sarcoid however, data has not been totally supportive of this.  In any event, this appears to be stable to "burnt out".  She has persistent issues with dyspnea and mostly fatigue.  Will evaluate for potential cardiac causes of dyspnea as well as reassess  pulmonary function.  She is having significant reflux and sleep issues, will treat reflux, she may need GI evaluation if symptoms are persistent.  We will also obtain split-night study.  See her in follow-up in 4 to 6 weeks time she is to contact us prior to that time should any new problems arise.  Gailen Shelter, MD [facility]   *This note was dictated using voice recognition software/Dragon.  Despite best efforts to proofread, errors can occur which can change the meaning.  Any change was purely unintentional.

## 2021-03-12 ENCOUNTER — Ambulatory Visit (INDEPENDENT_AMBULATORY_CARE_PROVIDER_SITE_OTHER): Payer: BC Managed Care – PPO

## 2021-03-12 DIAGNOSIS — I442 Atrioventricular block, complete: Secondary | ICD-10-CM

## 2021-03-12 LAB — CUP PACEART REMOTE DEVICE CHECK
Battery Remaining Longevity: 46 mo
Battery Remaining Percentage: 44 %
Battery Voltage: 2.95 V
Brady Statistic AP VP Percent: 1 %
Brady Statistic AP VS Percent: 0 %
Brady Statistic AS VP Percent: 99 %
Brady Statistic AS VS Percent: 1 %
Brady Statistic RA Percent Paced: 1 %
Brady Statistic RV Percent Paced: 99 %
Date Time Interrogation Session: 20220623020013
Implantable Lead Implant Date: 20170608
Implantable Lead Implant Date: 20170608
Implantable Lead Location: 753859
Implantable Lead Location: 753860
Implantable Pulse Generator Implant Date: 20170608
Lead Channel Impedance Value: 440 Ohm
Lead Channel Impedance Value: 450 Ohm
Lead Channel Pacing Threshold Amplitude: 0.75 V
Lead Channel Pacing Threshold Amplitude: 1 V
Lead Channel Pacing Threshold Pulse Width: 0.4 ms
Lead Channel Pacing Threshold Pulse Width: 0.4 ms
Lead Channel Sensing Intrinsic Amplitude: 12 mV
Lead Channel Sensing Intrinsic Amplitude: 4.1 mV
Lead Channel Setting Pacing Amplitude: 2 V
Lead Channel Setting Pacing Amplitude: 2.5 V
Lead Channel Setting Pacing Pulse Width: 0.4 ms
Lead Channel Setting Sensing Sensitivity: 2.5 mV
Pulse Gen Model: 2272
Pulse Gen Serial Number: 7903881

## 2021-03-31 NOTE — Progress Notes (Signed)
Remote pacemaker transmission.   

## 2021-04-03 ENCOUNTER — Telehealth: Payer: Self-pay

## 2021-04-03 NOTE — Telephone Encounter (Signed)
Pt is returning phone call. Pls regard; [phone].

## 2021-04-03 NOTE — Telephone Encounter (Signed)
ATC pt in regards to her upcoming covid test. Left a call back number and will attempt to call again later.

## 2021-04-03 NOTE — Telephone Encounter (Signed)
Patient is aware of date/time of covid prior to PFT and voiced her understanding.

## 2021-04-07 ENCOUNTER — Other Ambulatory Visit
Admission: RE | Admit: 2021-04-07 | Discharge: 2021-04-07 | Disposition: A | Discharge status: Home / Self Care | Payer: BC Managed Care – PPO | Source: Ambulatory Visit | Attending: Pulmonary Disease | Admitting: Pulmonary Disease

## 2021-04-07 ENCOUNTER — Other Ambulatory Visit: Payer: Self-pay

## 2021-04-07 DIAGNOSIS — Z01812 Encounter for preprocedural laboratory examination: Secondary | ICD-10-CM | POA: Diagnosis not present

## 2021-04-07 DIAGNOSIS — Z20822 Contact with and (suspected) exposure to covid-19: Secondary | ICD-10-CM | POA: Diagnosis not present

## 2021-04-07 LAB — SARS CORONAVIRUS 2 (TAT 6-24 HRS): SARS Coronavirus 2: NEGATIVE

## 2021-04-08 ENCOUNTER — Ambulatory Visit: Payer: BC Managed Care – PPO

## 2021-04-08 NOTE — Telephone Encounter (Signed)
Can you call and help?  I think that it sounds like someone should check her out on Thursday or Friday, but I will not be here.   As long as she can walk ok, then a couple of days should be fine.

## 2021-04-09 ENCOUNTER — Other Ambulatory Visit: Payer: Self-pay

## 2021-04-09 ENCOUNTER — Ambulatory Visit: Payer: BC Managed Care – PPO | Attending: Pulmonary Disease

## 2021-04-09 DIAGNOSIS — R0602 Shortness of breath: Secondary | ICD-10-CM | POA: Diagnosis not present

## 2021-04-13 ENCOUNTER — Ambulatory Visit: Payer: BC Managed Care – PPO | Admitting: Family Medicine

## 2021-04-15 ENCOUNTER — Other Ambulatory Visit: Payer: Self-pay

## 2021-04-15 ENCOUNTER — Encounter: Payer: Self-pay | Admitting: Family Medicine

## 2021-04-15 ENCOUNTER — Ambulatory Visit: Payer: BC Managed Care – PPO | Admitting: Family Medicine

## 2021-04-15 VITALS — BP 120/82 | HR 80 | Temp 98.5°F | Ht 63.0 in | Wt 207.5 lb

## 2021-04-15 DIAGNOSIS — S93402A Sprain of unspecified ligament of left ankle, initial encounter: Secondary | ICD-10-CM | POA: Diagnosis not present

## 2021-04-15 DIAGNOSIS — R600 Localized edema: Secondary | ICD-10-CM

## 2021-04-15 MED ORDER — DIAZEPAM 5 MG PO TABS: 5.0000 mg | ORAL_TABLET | Freq: Three times a day (TID) | ORAL | 3 refills | Status: DC | PRN

## 2021-04-15 NOTE — Progress Notes (Signed)
Anne Poulton T. Alva Broxson, MD, CAQ Sports Medicine [facility] at [facility] [address] [place], [address]  Phone: [phone]  FAX: [phone]  [name] - 60 y.o. female  MRN [id number]  Date of Birth: 10-Jul-1961  Date: 04/15/2021  PCP: [name], MD  Referral: [name], MD  Chief Complaint  Patient presents with   Foot Pain    Left-Turned ankle last Thursday    Foot Swelling    Left    This visit occurred during the SARS-CoV-2 public health emergency.  Safety protocols were in place, including screening questions prior to the visit, additional usage of staff PPE, and extensive cleaning of exam room while observing appropriate contact time as indicated for disinfecting solutions.   Subjective:   Anne Rush is a 60 y.o. very pleasant female patient with Body mass index is 36.76 kg/m. who presents with the following:  Acute L ankle pain: She had an inversion injury last week while wearing a pair of Danskos.  She is currently walking without any kind of a significant limp.  She did have some lateral swelling.  She also had some pain in the forefoot, but all of her symptoms are essentially minimal at this point.  She did not have any tibia, fibular, or midfoot pain.  Right now she is wearing a pair of clogs in the office without much significant difficulty walking.  She is having some mild lower extremity edema, she shows me some pictures where it has been worse recently. Swelling with know CHF and pacemaker for complete heart block.    Review of Systems is noted in the HPI, as appropriate  Objective:   BP 120/82   Pulse 80   Temp 98.5 F (36.9 C) (Temporal)   Ht 5\' 3"  (1.6 m)   Wt 207 lb 8 oz (94.1 kg)   SpO2 96%   BMI 36.76 kg/m   GEN: No acute distress; alert,appropriate. PULM: Breathing comfortably in no respiratory distress PSYCH: Normally interactive.  Left foot and ankle: Nontender to all phalanges.  There  is some swelling in the forefoot, but all metatarsals are nontender to palpation.  MTP joints are nontender.  The entirety of the midfoot is nontender.  Nontender at the medial or lateral malleolus as well as the base of the fifth metatarsal, cuboid, cuneiforms and the calcaneus.  She has no tenderness at the CFL and some mild tenderness at the ATFL with no tenderness at the deltoid ligaments.  Bilateral trace lower extremity edema  Laboratory and Imaging Data:  Assessment and Plan:     ICD-10-CM   1. Mild ankle sprain, left, initial encounter  S93.402A     2. Lower extremity edema  R60.0      Mild ATFL sprain, left.  Recovering, I do not think she needs to do anything else.  Recommended tennis shoes instead of clogs currently.  She has had some lower extremity edema, she does have congestive heart failure.  She does have an upcoming heart echo within the next couple of weeks, and this will hopefully let and cardiology know if this is significantly worsened.  Meds ordered this encounter  Medications   diazepam (VALIUM) 5 MG tablet    Sig: Take 1 tablet (5 mg total) by mouth every 8 (eight) hours as needed. for anxiety    Dispense:  30 tablet    Refill:  3   Medications Discontinued During This Encounter  Medication Reason  albuterol (VENTOLIN HFA) 108 (90 Base) MCG/ACT inhaler Completed Course   doxycycline (VIBRA-TABS) 100 MG tablet Completed Course   esomeprazole (NEXIUM) 40 MG capsule Completed Course   diazepam (VALIUM) 5 MG tablet Reorder     Signed,  Ainsley Sanguinetti T. Montavis Schubring, MD   Outpatient Encounter Medications as of 04/15/2021  Medication Sig   carvedilol (COREG) 25 MG tablet Take 1 tablet (25 mg total) by mouth 2 (two) times daily.   famotidine (PEPCID) 20 MG tablet Take 1 tablet (20 mg total) by mouth daily.   spironolactone (ALDACTONE) 25 MG tablet TAKE ONE TABLET BY MOUTH DAILY   [DISCONTINUED] diazepam (VALIUM) 5 MG tablet TAKE ONE TABLET BY MOUTH EVERY 8  HOURS AS NEEDED FOR ANXIETY   diazepam (VALIUM) 5 MG tablet Take 1 tablet (5 mg total) by mouth every 8 (eight) hours as needed. for anxiety   [DISCONTINUED] albuterol (VENTOLIN HFA) 108 (90 Base) MCG/ACT inhaler Inhale 1-2 puffs into the lungs every 6 (six) hours as needed for wheezing or shortness of breath.   [DISCONTINUED] doxycycline (VIBRA-TABS) 100 MG tablet Take 1 tablet (100 mg total) by mouth 2 (two) times daily. Extend additional 7 days   [DISCONTINUED] esomeprazole (NEXIUM) 40 MG capsule Take 1 capsule (40 mg total) by mouth daily at 12 noon.   Facility-Administered Encounter Medications as of 04/15/2021  Medication   albuterol (PROVENTIL) (2.5 MG/3ML) 0.083% nebulizer solution 2.5 mg

## 2021-04-20 DIAGNOSIS — H524 Presbyopia: Secondary | ICD-10-CM | POA: Diagnosis not present

## 2021-04-22 ENCOUNTER — Other Ambulatory Visit: Payer: Self-pay

## 2021-04-22 ENCOUNTER — Ambulatory Visit: Payer: BC Managed Care – PPO | Admitting: Pulmonary Disease

## 2021-04-22 ENCOUNTER — Encounter: Payer: Self-pay | Admitting: Pulmonary Disease

## 2021-04-22 VITALS — BP 126/82 | HR 91 | Temp 97.8°F | Ht 63.0 in | Wt 207.6 lb

## 2021-04-22 DIAGNOSIS — R0602 Shortness of breath: Secondary | ICD-10-CM

## 2021-04-22 DIAGNOSIS — K219 Gastro-esophageal reflux disease without esophagitis: Secondary | ICD-10-CM

## 2021-04-22 DIAGNOSIS — R0681 Apnea, not elsewhere classified: Secondary | ICD-10-CM

## 2021-04-22 DIAGNOSIS — J849 Interstitial pulmonary disease, unspecified: Secondary | ICD-10-CM

## 2021-04-22 DIAGNOSIS — R5383 Other fatigue: Secondary | ICD-10-CM

## 2021-04-22 DIAGNOSIS — Z9189 Other specified personal risk factors, not elsewhere classified: Secondary | ICD-10-CM | POA: Diagnosis not present

## 2021-04-22 NOTE — Progress Notes (Signed)
Subjective:    Patient ID: Anne Rush, female    DOB: May 05, 1961, 60 y.o.   MRN: [id number] Chief Complaint  Patient presents with   Follow-up    Sob when going upstairs. Breathing is doing well otherwise.     HPI The patient is a 60 year old former smoker (quit 2012, 15-pack-year history) who presents for follow-up on the issue of interstitial lung disease and dyspnea.  This is a scheduled visit.  She has been previously followed by Dr. Marchelle Gearing, I first evaluated her on 11 March 2021.  She was diagnosed with ILD in 2012, at that time she quit smoking.  Her ILD is clinically not UIP/IPF.  She has declined biopsies or invasive procedures previously.  She has had no evidence of autoimmune ILD.  She had prior follow-up at pulmonary clinic at [facility] and was evaluated by Dr. Sandy Salaam.  Her initial diagnosis was RB ILD and she initially improved after cessation of smoking.  Most recent CT chest was performed in December 2021 and shows findings compatible with fibrotic interstitial lung disease without frank honeycombing and with a slight upper lung predominance.  These findings appear to have progressed since the remote 2012 chest CT.  But appeared improved from the CT of July 2021.  Differential diagnosis continues to be chronic hypersensitivity pneumonitis, fibrotic NSIP/postinflammatory fibrosis.  Again, not consistent with UIP/IPF.  Patient underwent PFTs on 09 April 2021 for lung function to be stable, FEV1 is 2.37 L or 94% predicted FEV1/FVC is 86%.  Lung volumes were normal.  Diffusion capacity by Kco low end of normal at 84%.  At her prior visit we had recommended repeat echocardiogram and also sleep study as she was having some sleep apnea symptoms.  These have not been performed as of yet.  Today she does not endorse any new symptomatology.  Continues to have issues with frequent daytime napping poor sleep hygiene and nonrestorative sleep.  She has a pacemaker due to complete heart block  previously.  During her prior visit we also provided her with Nexium 40 mg daily which she notes has helped her gastroesophageal reflux symptoms.  She has not been able to have a split night sleep study performed due to cost.  She would have to pay in excess of $4000 for the study.   Review of Systems A 10 point review of systems was performed and it is as noted above otherwise negative.  Patient Active Problem List   Diagnosis Date Noted   GERD (gastroesophageal reflux disease) 07/15/2020   Chronic systolic CHF (congestive heart failure) (HCC) 07/15/2020   Interstitial lung disease (HCC) 04/18/2020   Diastolic dysfunction 04/02/2020   Pacemaker 10/01/2018   Vitamin D deficiency 01/11/2018   Allergic angioedema 03/17/2016   Complete heart block (HCC) s/p Pacemaker 02/26/2016   Former tobacco use 08/28/2008   Hyperlipidemia LDL goal <100 06/21/2007   GAD (generalized anxiety disorder) 06/05/2007   Essential hypertension 06/05/2007   ALLERGIC RHINITIS 06/05/2007   Social History   Tobacco Use   Smoking status: Former    Packs/day: 0.50    Years: 30.00    Pack years: 15.00    Types: Cigarettes    Quit date: 2012    Years since quitting: 10.5   Smokeless tobacco: Never  Substance Use Topics   Alcohol use: No   Allergies  Allergen Reactions   Codeine Hives and Itching   Sulfonamide Derivatives Hives   Paroxetine Other (See Comments)    unknown   Lisinopril  Rash   Losartan Potassium Other (See Comments)    Leg felt heavy   Neomycin-Polymyxin-Dexameth Itching, Swelling and Other (See Comments)    Ey drop/Swelling and itching of eye    Oxycodone-Acetaminophen Other (See Comments)    Mild SOB, feels funny   Sulfa Antibiotics Hives and Rash        Current Meds  Medication Sig   diazepam (VALIUM) 5 MG tablet Take 1 tablet (5 mg total) by mouth every 8 (eight) hours as needed. for anxiety   famotidine (PEPCID) 20 MG tablet Take 1 tablet (20 mg total) by mouth daily.    spironolactone (ALDACTONE) 25 MG tablet TAKE ONE TABLET BY MOUTH DAILY   Immunization History  Administered Date(s) Administered   Influenza Nasal 06/20/2017   Influenza Whole 07/04/2008, 06/18/2009, 08/27/2010   Influenza, Seasonal, Injecte, Preservative Fre 09/03/2010, 06/22/2011, 06/27/2012   Influenza,inj,Quad PF,6+ Mos 07/21/2015, 07/06/2018   Influenza-Unspecified 06/27/2012, 07/17/2014, 07/19/2016, 07/21/2019   Td 05/10/2002   Tdap 10/28/2014   We discussed Covid-19 precautions.  I reviewed the vaccine effectiveness and potential side effects in detail to include differences between mRNA vaccines and traditional vaccines (attenuated virus).  Discussion also offered of long-term effectiveness and safety profile which are unclear at this time.  Discussed current CDC guidance that all patients are recommended COVID-19 vaccinations -as long as they do not have allergy to components of the vaccine.     Objective:   Physical Exam BP 126/82 (BP Location: Left Arm, Cuff Size: Normal)   Pulse 91   Temp 97.8 F (36.6 C) (Temporal)   Ht 5\' 3"  (1.6 m)   Wt 207 lb 9.6 oz (94.2 kg)   SpO2 95%   BMI 36.77 kg/m  GENERAL: Obese woman, no acute distress.  Appears tired.  Fully ambulatory.  No conversational dyspnea. HEAD: Normocephalic, atraumatic.  EYES: Pupils equal, round, reactive to light.  No scleral icterus.  Mild periorbital edema/puffiness. MOUTH: Nose/mouth/throat not examined due to masking requirements for COVID 19. NECK: Supple. No thyromegaly. Trachea midline. No JVD.  No adenopathy. PULMONARY: Good air entry bilaterally.  No adventitious sounds.  Overt crackles. CARDIOVASCULAR: S1 and S2. Regular rate and rhythm.  Pacer dependent.  No murmurs. ABDOMEN: Obese otherwise benign. MUSCULOSKELETAL: No joint deformity, no clubbing, no edema.  NEUROLOGIC: No focal deficit, no gait disturbance, speech is fluent. SKIN: Intact,warm,dry. PSYCH: Mood and behavior normal.     Assessment  & Plan:     ICD-10-CM   1. ILD (interstitial lung disease) (HCC)  J84.9    This appears to be stable 2D echo pending to evaluate for potential pulmonary hypertension At risk for PAH due to ILD    2. Shortness of breath  R06.02    Multifactorial Noted to have prior cardiomyopathy, complete heart block 2D echo pending PFTs nonrevealing    3. At risk for sleep apnea  Z91.89 Home sleep test   We will proceed with at home sleep study This will be more economical for the patient    4. GERD with apnea  K21.9    R06.81    Continue Nexium and nighttime Pepcid Patient notes improvement in symptoms    5. Fatigue, unspecified type  R53.83    Likely related to poor sleep hygiene     Orders Placed This Encounter  Procedures   Home sleep test    Standing Status:   Future    Standing Expiration Date:   04/22/2022    Order Specific Question:   Where should  this test be performed:    Answer:   [facility] - Pulmonary   Patient needs to have 2D echo at home sleep study performed.  We will see her in 4 to 6 weeks time she may see me or the nurse practitioner at that time.   Gailen Shelter, MD Advanced Bronchoscopy PCCM [facility]    *This note was dictated using voice recognition software/Dragon.  Despite best efforts to proofread, errors can occur which can change the meaning.  Any change was purely unintentional.

## 2021-04-22 NOTE — Patient Instructions (Addendum)
We are switching the sleep study to an at home sleep study.  This should be more economical.  Make sure you have your echocardiogram done this is indeed a complete echocardiogram.  We will see him in follow-up in 4 to 6 weeks time you may see either me or our nurse practitioner at that time.

## 2021-05-06 ENCOUNTER — Other Ambulatory Visit: Payer: Self-pay

## 2021-05-06 ENCOUNTER — Ambulatory Visit (INDEPENDENT_AMBULATORY_CARE_PROVIDER_SITE_OTHER): Payer: BC Managed Care – PPO

## 2021-05-06 DIAGNOSIS — R0602 Shortness of breath: Secondary | ICD-10-CM

## 2021-05-06 LAB — ECHOCARDIOGRAM COMPLETE
AR max vel: 2.71 cm2
AV Area VTI: 2.83 cm2
AV Area mean vel: 2.61 cm2
AV Mean grad: 5 mmHg
AV Peak grad: 8.6 mmHg
Ao pk vel: 1.47 m/s
Area-P 1/2: 3.42 cm2
Calc EF: 55.6 %
S' Lateral: 3.9 cm
Single Plane A2C EF: 53.8 %
Single Plane A4C EF: 51.4 %

## 2021-05-16 ENCOUNTER — Other Ambulatory Visit: Payer: Self-pay | Admitting: Cardiology

## 2021-05-19 ENCOUNTER — Other Ambulatory Visit: Payer: Self-pay

## 2021-05-19 ENCOUNTER — Encounter: Payer: Self-pay | Admitting: Obstetrics and Gynecology

## 2021-05-19 ENCOUNTER — Ambulatory Visit: Payer: BC Managed Care – PPO | Admitting: Obstetrics and Gynecology

## 2021-05-19 ENCOUNTER — Telehealth: Payer: Self-pay | Admitting: Pulmonary Disease

## 2021-05-19 VITALS — BP 126/84 | Ht 63.0 in

## 2021-05-19 DIAGNOSIS — Z1239 Encounter for other screening for malignant neoplasm of breast: Secondary | ICD-10-CM | POA: Diagnosis not present

## 2021-05-19 NOTE — Telephone Encounter (Signed)
Please see echo result note.

## 2021-05-19 NOTE — Progress Notes (Signed)
Obstetrics & Gynecology Office Visit   Chief Complaint  Patient presents with   Follow-up    Breast exam    History of Present Illness: 60 y.o. G47P0101 female who presents only for a breast exam. She does not get mammography due to her concern for interference with her pace maker. She has no acute breast complaints.  She does note about some leakage of urine with certain activities.   She denies vaginal bleeding.   Her last pap smear  was 09/2018 and was NILM, HPV negative   Past Medical History:  Diagnosis Date   Chronic systolic CHF (congestive heart failure) (HCC) 07/15/2020   Complete heart block (HCC) 02/26/2016   s/p Pacemaker   Family history of ovarian cancer    Generalized anxiety disorder 07/15/2020   GERD (gastroesophageal reflux disease)    History of kidney stones    Hyperlipidemia    Hypertension    Interstitial lung disease (HCC)    Pollen allergies    Presence of permanent cardiac pacemaker    Squamous cell carcinoma 2014   skin cancer   Subclinical hyperthyroidism 06/17/2014    Past Surgical History:  Procedure Laterality Date   CESAREAN SECTION     COSMETIC SURGERY     skin cancer face   CYSTOSCOPY W/ URETERAL STENT PLACEMENT Left 07/15/2020   Procedure: CYSTOSCOPY WITH RETROGRADE PYELOGRAM/URETERAL STENT PLACEMENT;  Surgeon: Riki Altes, MD;  Location: [facility] ORS;  Service: Urology;  Laterality: Left;   CYSTOSCOPY/URETEROSCOPY/HOLMIUM LASER/STENT PLACEMENT Left 07/28/2020   Procedure: CYSTOSCOPY/URETEROSCOPY/HOLMIUM LASER/STENT Exchange;  Surgeon: Vanna Scotland, MD;  Location: [facility] OR[facility];  Service: Urology;  Laterality: Left;   EP IMPLANTABLE DEVICE N/A 02/26/2016   Procedure: Pacemaker Implant;  Surgeon: Will Jorja Loa, MD;  Location: [facility];  Service: Cardiovascular;  Laterality: N/A;   TUBAL LIGATION      Gynecologic History: No LMP recorded. Patient is postmenopausal.  Obstetric History: G1P0101  Family History  Problem  Relation Age of Onset   Anxiety disorder Mother    Hypertension Mother    Ovarian cancer Mother 48   Cancer Maternal Aunt        breast   Heart failure Maternal Grandfather     Social History   Socioeconomic History   Marital status: Married    Spouse name: Not on file   Number of children: 1   Years of education: Not on file   Highest education level: Not on file  Occupational History   Occupation: house wife    Employer: [employer]  Tobacco Use   Smoking status: Former    Packs/day: 0.50    Years: 30.00    Pack years: 15.00    Types: Cigarettes    Quit date: 2012    Years since quitting: 10.6   Smokeless tobacco: Never  Vaping Use   Vaping Use: Never used  Substance and Sexual Activity   Alcohol use: No   Drug use: No   Sexual activity: Yes    Birth control/protection: Post-menopausal  Other Topics Concern   Not on file  Social History Narrative   Regular exercise-no   Social Determinants of Health   Financial Resource Strain: Not on file  Food Insecurity: Not on file  Transportation Needs: Not on file  Physical Activity: Not on file  Stress: Not on file  Social Connections: Not on file  Intimate Partner Violence: Not on file    Allergies  Allergen Reactions   Codeine Hives and Itching  Sulfonamide Derivatives Hives   Paroxetine Other (See Comments)    unknown   Lisinopril Rash   Losartan Potassium Other (See Comments)    Leg felt heavy   Neomycin-Polymyxin-Dexameth Itching, Swelling and Other (See Comments)    Ey drop/Swelling and itching of eye    Oxycodone-Acetaminophen Other (See Comments)    Mild SOB, feels funny   Sulfa Antibiotics Hives and Rash         Prior to Admission medications   Medication Sig Start Date End Date Taking? Authorizing Provider  carvedilol (COREG) 25 MG tablet TAKE ONE TABLET BY MOUTH TWICE A DAY WITH A MEAL 05/19/21  Yes Camnitz, Will Daphine Deutscher, MD  diazepam (VALIUM) 5 MG tablet Take 1 tablet (5 mg total) by mouth every  8 (eight) hours as needed. for anxiety 04/15/21  Yes Copland, Karleen Hampshire, MD  spironolactone (ALDACTONE) 25 MG tablet TAKE ONE TABLET BY MOUTH DAILY 01/01/21  Yes Copland, Karleen Hampshire, MD  famotidine (PEPCID) 20 MG tablet Take 1 tablet (20 mg total) by mouth daily. Patient not taking: Reported on 05/19/2021 04/06/20   Lynn Ito, MD    Review of Systems  Constitutional: Negative.   HENT: Negative.    Eyes: Negative.   Respiratory: Negative.    Cardiovascular: Negative.   Gastrointestinal: Negative.   Genitourinary: Negative.   Musculoskeletal: Negative.   Skin: Negative.   Neurological: Negative.   Psychiatric/Behavioral: Negative.      Physical Exam BP 126/84   Ht 5\' 3"  (1.6 m)   BMI 36.77 kg/m  No LMP recorded. Patient is postmenopausal. Physical Exam Constitutional:      General: She is not in acute distress.    Appearance: Normal appearance.  Genitourinary:  Breasts:    Right: No swelling, bleeding, inverted nipple, mass, nipple discharge, skin change or tenderness.     Left: No swelling, bleeding, inverted nipple, mass, nipple discharge, skin change or tenderness.  HENT:     Head: Normocephalic and atraumatic.  Eyes:     General: No scleral icterus.    Conjunctiva/sclera: Conjunctivae normal.  Chest:    Neurological:     General: No focal deficit present.     Mental Status: She is alert and oriented to person, place, and time.     Cranial Nerves: No cranial nerve deficit.  Psychiatric:        Mood and Affect: Mood normal.        Behavior: Behavior normal.        Judgment: Judgment normal.    Female chaperone present for pelvic and breast  portions of the physical exam  Assessment: 60 y.o. G65P0101 female here for  1. Encounter for screening breast examination      Plan: Problem List Items Addressed This Visit   None Visit Diagnoses     Encounter for screening breast examination    -  Primary      Discussed possible treatment options for urinary  leakage Normal breast exam. She does not get mammograms due to concerns with her pacemaker.  A total of 21 minutes were spent face-to-face with the patient as well as preparation, review, communication, and documentation during this encounter.    G2P80, MD 05/19/2021 10:59 AM

## 2021-05-21 ENCOUNTER — Telehealth: Payer: Self-pay | Admitting: Pulmonary Disease

## 2021-05-21 ENCOUNTER — Other Ambulatory Visit: Payer: Self-pay

## 2021-05-21 ENCOUNTER — Encounter: Payer: Self-pay | Admitting: Intensive Care

## 2021-05-21 ENCOUNTER — Emergency Department: Payer: BC Managed Care – PPO

## 2021-05-21 ENCOUNTER — Emergency Department
Admission: EM | Admit: 2021-05-21 | Discharge: 2021-05-21 | Disposition: A | Discharge status: Home / Self Care | Payer: BC Managed Care – PPO | Attending: Emergency Medicine | Admitting: Emergency Medicine

## 2021-05-21 DIAGNOSIS — Z950 Presence of cardiac pacemaker: Secondary | ICD-10-CM | POA: Insufficient documentation

## 2021-05-21 DIAGNOSIS — Z85828 Personal history of other malignant neoplasm of skin: Secondary | ICD-10-CM | POA: Insufficient documentation

## 2021-05-21 DIAGNOSIS — J8410 Pulmonary fibrosis, unspecified: Secondary | ICD-10-CM | POA: Diagnosis not present

## 2021-05-21 DIAGNOSIS — U071 COVID-19: Secondary | ICD-10-CM | POA: Diagnosis not present

## 2021-05-21 DIAGNOSIS — Z87891 Personal history of nicotine dependence: Secondary | ICD-10-CM | POA: Diagnosis not present

## 2021-05-21 DIAGNOSIS — R059 Cough, unspecified: Secondary | ICD-10-CM | POA: Diagnosis not present

## 2021-05-21 DIAGNOSIS — I5022 Chronic systolic (congestive) heart failure: Secondary | ICD-10-CM | POA: Diagnosis not present

## 2021-05-21 DIAGNOSIS — I110 Hypertensive heart disease with heart failure: Secondary | ICD-10-CM | POA: Diagnosis not present

## 2021-05-21 DIAGNOSIS — Z0389 Encounter for observation for other suspected diseases and conditions ruled out: Secondary | ICD-10-CM | POA: Diagnosis not present

## 2021-05-21 HISTORY — DX: Pulmonary fibrosis, unspecified: J84.10

## 2021-05-21 MED ORDER — ALBUTEROL SULFATE HFA 108 (90 BASE) MCG/ACT IN AERS: 2.0000 | INHALATION_SPRAY | Freq: Four times a day (QID) | RESPIRATORY_TRACT | 0 refills | Status: DC | PRN

## 2021-05-21 MED ORDER — PREDNISONE 50 MG PO TABS: 50.0000 mg | ORAL_TABLET | Freq: Every day | ORAL | 0 refills | Status: DC

## 2021-05-21 MED ORDER — BENZONATATE 100 MG PO CAPS: 100.0000 mg | ORAL_CAPSULE | Freq: Three times a day (TID) | ORAL | 0 refills | Status: DC | PRN

## 2021-05-21 MED ORDER — ONDANSETRON 4 MG PO TBDP: 4.0000 mg | ORAL_TABLET | Freq: Three times a day (TID) | ORAL | 0 refills | Status: DC | PRN

## 2021-05-21 NOTE — ED Provider Notes (Signed)
[facility] Emergency Department Provider Note  ____________________________________________  Time seen: Approximately 12:26 PM  I have reviewed the triage vital signs and the nursing notes.   HISTORY  Chief Complaint Sore Throat and Generalized Body Aches    HPI Anne Rush is a 60 y.o. female who presents the emergency department with multiple symptoms consistent with COVID-19.  Patient has had fevers, chills, body aches, nasal congestion, cough, nausea.  Patient states that she took a home test and was positive for COVID as of this morning.  Symptoms have been ongoing x2 days.  She has had a history of CHF, heart block, GERD, anxiety, pulmonary fibrosis.  Given her symptoms, it has been recommended that patient be seen in the emergency department to ensure no evidence of pneumonia.  Patient is having no shortness of breath at rest.  She does not wear supplemental oxygen and states that she has been checking her oxygen saturation at home and it has not dropped below 92%.  Patient denies any neck pain or stiffness, chest pain, emesis, diarrhea or constipation.  Again symptoms began yesterday.       Past Medical History:  Diagnosis Date   Chronic systolic CHF (congestive heart failure) (HCC) 07/15/2020   Complete heart block (HCC) 02/26/2016   s/p Pacemaker   Family history of ovarian cancer    Generalized anxiety disorder 07/15/2020   GERD (gastroesophageal reflux disease)    History of kidney stones    Hyperlipidemia    Hypertension    Interstitial lung disease (HCC)    Pollen allergies    Presence of permanent cardiac pacemaker    Pulmonary fibrosis (HCC)    Squamous cell carcinoma 2014   skin cancer   Subclinical hyperthyroidism 06/17/2014    Patient Active Problem List   Diagnosis Date Noted   GERD (gastroesophageal reflux disease) 07/15/2020   Chronic systolic CHF (congestive heart failure) (HCC) 07/15/2020   Interstitial lung disease (HCC)  04/18/2020   Diastolic dysfunction 04/02/2020   Pacemaker 10/01/2018   Vitamin D deficiency 01/11/2018   Allergic angioedema 03/17/2016   Complete heart block (HCC) s/p Pacemaker 02/26/2016   Former tobacco use 08/28/2008   Hyperlipidemia LDL goal <100 06/21/2007   GAD (generalized anxiety disorder) 06/05/2007   Essential hypertension 06/05/2007   ALLERGIC RHINITIS 06/05/2007    Past Surgical History:  Procedure Laterality Date   CESAREAN SECTION     COSMETIC SURGERY     skin cancer face   CYSTOSCOPY W/ URETERAL STENT PLACEMENT Left 07/15/2020   Procedure: CYSTOSCOPY WITH RETROGRADE PYELOGRAM/URETERAL STENT PLACEMENT;  Surgeon: Riki Altes, MD;  Location: [facility] ORS;  Service: Urology;  Laterality: Left;   CYSTOSCOPY/URETEROSCOPY/HOLMIUM LASER/STENT PLACEMENT Left 07/28/2020   Procedure: CYSTOSCOPY/URETEROSCOPY/HOLMIUM LASER/STENT Exchange;  Surgeon: Vanna Scotland, MD;  Location: [facility] ORS;  Service: Urology;  Laterality: Left;   EP IMPLANTABLE DEVICE N/A 02/26/2016   Procedure: Pacemaker Implant;  Surgeon: Will Jorja Loa, MD;  Location: [facility];  Service: Cardiovascular;  Laterality: N/A;   TUBAL LIGATION      Prior to Admission medications   Medication Sig Start Date End Date Taking? Authorizing Provider  albuterol (VENTOLIN HFA) 108 (90 Base) MCG/ACT inhaler Inhale 2 puffs into the lungs every 6 (six) hours as needed for wheezing or shortness of breath. 05/21/21  Yes Cynia Abruzzo, Delorise Royals, PA-C  benzonatate (TESSALON PERLES) 100 MG capsule Take 1 capsule (100 mg total) by mouth 3 (three) times daily as needed for cough. 05/21/21 05/21/22 Yes  Rameen Quinney, Delorise Royals, PA-C  ondansetron (ZOFRAN-ODT) 4 MG disintegrating tablet Take 1 tablet (4 mg total) by mouth every 8 (eight) hours as needed for nausea or vomiting. 05/21/21  Yes Sylvia Helms, Delorise Royals, PA-C  predniSONE (DELTASONE) 50 MG tablet Take 1 tablet (50 mg total) by mouth daily with breakfast. 05/21/21  Yes Kassandra Meriweather,  Delorise Royals, PA-C  carvedilol (COREG) 25 MG tablet TAKE ONE TABLET BY MOUTH TWICE A DAY WITH A MEAL 05/19/21   Camnitz, Will Daphine Deutscher, MD  diazepam (VALIUM) 5 MG tablet Take 1 tablet (5 mg total) by mouth every 8 (eight) hours as needed. for anxiety 04/15/21   Copland, Karleen Hampshire, MD  famotidine (PEPCID) 20 MG tablet Take 1 tablet (20 mg total) by mouth daily. Patient not taking: Reported on 05/19/2021 04/06/20   Lynn Ito, MD  spironolactone (ALDACTONE) 25 MG tablet TAKE ONE TABLET BY MOUTH DAILY 01/01/21   Copland, Karleen Hampshire, MD    Allergies Codeine, Sulfonamide derivatives, Paroxetine, Lisinopril, Losartan potassium, Neomycin-polymyxin-dexameth, Oxycodone-acetaminophen, and Sulfa antibiotics  Family History  Problem Relation Age of Onset   Anxiety disorder Mother    Hypertension Mother    Ovarian cancer Mother 15   Cancer Maternal Aunt        breast   Heart failure Maternal Grandfather     Social History Social History   Tobacco Use   Smoking status: Former    Packs/day: 0.50    Years: 30.00    Pack years: 15.00    Types: Cigarettes    Quit date: 2012    Years since quitting: 10.6   Smokeless tobacco: Never  Vaping Use   Vaping Use: Never used  Substance Use Topics   Alcohol use: No   Drug use: No     Review of Systems  Constitutional: Positive fever/chills.  Positive for body aches Eyes: No visual changes. No discharge ENT: Positive for nasal congestion sore throat. Cardiovascular: no chest pain. Respiratory: no cough. No SOB. Gastrointestinal: No abdominal pain.  Positive nausea, no vomiting.  No diarrhea.  No constipation. Genitourinary: Negative for dysuria. No hematuria Musculoskeletal: Negative for musculoskeletal pain. Skin: Negative for rash, abrasions, lacerations, ecchymosis. Neurological: Negative for headaches, focal weakness or numbness.  10 System ROS otherwise negative.  ____________________________________________   PHYSICAL EXAM:  VITAL SIGNS: ED  Triage Vitals  Enc Vitals Group     BP 05/21/21 1156 (!) 149/91     Pulse Rate 05/21/21 1156 95     Resp 05/21/21 1156 18     Temp 05/21/21 1156 99.3 F (37.4 C)     Temp Source 05/21/21 1156 Oral     SpO2 05/21/21 1156 95 %     Weight 05/21/21 1157 205 lb (93 kg)     Height 05/21/21 1157 5\' 3"  (1.6 m)     Head Circumference --      Peak Flow --      Pain Score 05/21/21 1157 7     Pain Loc --      Pain Edu? --      Excl. in GC? --      Constitutional: Alert and oriented.  Mildly ill appearing but in no acute distress. Eyes: Conjunctivae are normal. PERRL. EOMI. Head: Atraumatic. ENT:      Ears: EACs and TMs unremarkable bilaterally.      Nose: No congestion/rhinnorhea.      Mouth/Throat: Mucous membranes are moist.  Neck: No stridor.  Neck is supple full range of motion Hematological/Lymphatic/Immunilogical: No cervical lymphadenopathy Cardiovascular: Normal  rate, regular rhythm. Normal S1 and S2.  Good peripheral circulation. Respiratory: Normal respiratory effort without tachypnea or retractions. Lungs CTAB specifically no wheezing, rales or rhonchi. Good air entry to the bases with no decreased or absent breath sounds. Gastrointestinal: Bowel sounds 4 quadrants. Soft and nontender to palpation. No guarding or rigidity. No palpable masses. No distention. Musculoskeletal: Full range of motion to all extremities. No gross deformities appreciated. Neurologic:  Normal speech and language. No gross focal neurologic deficits are appreciated.  Skin:  Skin is warm, dry and intact. No rash noted. Psychiatric: Mood and affect are normal. Speech and behavior are normal. Patient exhibits appropriate insight and judgement.   ____________________________________________   LABS (all labs ordered are listed, but only abnormal results are displayed)  Labs Reviewed - No data to  display ____________________________________________  EKG   ____________________________________________  RADIOLOGY I personally viewed and evaluated these images as part of my medical decision making, as well as reviewing the written report by the radiologist.  ED Provider Interpretation: No evidence of consolidation concerning for pneumonia.  DG Chest 1 View  Result Date: 05/21/2021 CLINICAL DATA:  Concern for infection EXAM: CHEST  1 VIEW COMPARISON:  None. FINDINGS: Left chest wall dual lead pacemaker with unchanged lead position. The heart size and mediastinal contours are within normal limits. Unchanged fibrotic changes of the upper lungs. Lungs are otherwise clear. The visualized skeletal structures are unremarkable. IMPRESSION: No evidence of pneumonia Electronically Signed   By: Allegra Lai M.D.   On: 05/21/2021 13:30    ____________________________________________    PROCEDURES  Procedure(s) performed:    Procedures    Medications - No data to display   ____________________________________________   INITIAL IMPRESSION / ASSESSMENT AND PLAN / ED COURSE  Pertinent labs & imaging results that were available during my care of the patient were reviewed by me and considered in my medical decision making (see chart for details).  Review of the [place] CSRS was performed in accordance of the [facility] prior to dispensing any controlled drugs.           Patient's diagnosis is consistent with COVID-19.  Patient presents with multiple viral symptoms with a positive COVID test at home.  Patient was concern for possible pneumonia given her pulmonary fibrosis.  Chest x-ray is reassuring with no evidence of consolidation.  Overall exam is reassuring, vitals have remained stable with the patient.  This time symptoms are consistent with COVID-19 and should be treated with multiple symptom control medications.  Return precautions discussed with the patient at length.  Follow-up  primary care or specialist as needed. Patient is given ED precautions to return to the ED for any worsening or new symptoms.     ____________________________________________  FINAL CLINICAL IMPRESSION(S) / ED DIAGNOSES  Final diagnoses:  COVID-19      NEW MEDICATIONS STARTED DURING THIS VISIT:  ED Discharge Orders          Ordered    predniSONE (DELTASONE) 50 MG tablet  Daily with breakfast        05/21/21 1403    albuterol (VENTOLIN HFA) 108 (90 Base) MCG/ACT inhaler  Every 6 hours PRN        05/21/21 1403    benzonatate (TESSALON PERLES) 100 MG capsule  3 times daily PRN        05/21/21 1403    ondansetron (ZOFRAN-ODT) 4 MG disintegrating tablet  Every 8 hours PRN        05/21/21 1403  This chart was dictated using voice recognition software/Dragon. Despite best efforts to proofread, errors can occur which can change the meaning. Any change was purely unintentional.    Racheal Patches, PA-C 05/21/21 1406    Arnaldo Natal, MD 05/21/21 [phone]

## 2021-05-21 NOTE — Telephone Encounter (Signed)
Noted  

## 2021-05-21 NOTE — Telephone Encounter (Signed)
Spoke to patient. Patient reports of fever of 101.4, body aches, sweats, chills, ears/throat pain, increased sob with exertion and dry cough x2d. BP 152/101 with BP meds on board.  Tylenol q6h is not breaking fever.  She does not wear supplemental . Spo2 maintaining between 93-95%. No covid test.  Not vaccinated against covid.   Dr. Belia Heman, please advise. Dr. Jayme Cloud is unavailble.

## 2021-05-21 NOTE — Telephone Encounter (Signed)
Received call back to patient's spouse, Lynn(DPR). [name] stated that patient did test positive for covid on home test.  She is go to ED now for eval.  Routing to Dr. Belia Heman and Dr. Jayme Cloud as an Lorain Childes.

## 2021-05-21 NOTE — ED Notes (Signed)
See triage note  States she developed fever,sore throat and body aches yesterday  Tested positive for COVID today low grade temp noted on arrival   States her MD sent her here for x-ray  resp even and non labored at present

## 2021-05-21 NOTE — Telephone Encounter (Signed)
Patient is aware below recommendations and voiced her understanding.  Nothing further needed.

## 2021-05-21 NOTE — ED Triage Notes (Signed)
Pt reports positive covid. C/o sore throat,fever, body aches, and b/p fluctuating. Hx pulmonary fibrosis and CHF. Patients pulmonologist sent patient here for scan to rule out pneumonia. Unlabored breathing in triage.

## 2021-06-03 ENCOUNTER — Telehealth: Payer: BC Managed Care – PPO | Admitting: Family Medicine

## 2021-06-03 ENCOUNTER — Encounter: Payer: Self-pay | Admitting: Family Medicine

## 2021-06-03 ENCOUNTER — Other Ambulatory Visit: Payer: Self-pay

## 2021-06-03 VITALS — BP 109/80 | HR 88 | Ht 63.0 in

## 2021-06-03 DIAGNOSIS — J849 Interstitial pulmonary disease, unspecified: Secondary | ICD-10-CM

## 2021-06-03 DIAGNOSIS — U071 COVID-19: Secondary | ICD-10-CM | POA: Diagnosis not present

## 2021-06-03 NOTE — Progress Notes (Signed)
      Anne Bignell T. English Tomer, MD Primary Care and Sports Medicine [facility] at [facility] [address] [place], [address] Phone: [phone]  FAX: [phone]  Anne Rush - 60 y.o. female  MRN [id number]  Date of Birth: 1961/09/14  Visit Date: 06/03/2021  PCP: [name], MD  Referred by: [name], MD  Virtual Visit via Video Note:  I connected with  Shireen Rayburn Mcneff on 06/03/2021  9:20 AM EDT by a video enabled telemedicine application and verified that I am speaking with the correct person using two identifiers.   Location patient: home computer, tablet, or smartphone Location provider: work or home office Consent: Verbal consent directly obtained from Anne Rush [employer]. Persons participating in the virtual visit: patient, provider  I discussed the limitations of evaluation and management by telemedicine and the availability of in person appointments. The patient expressed understanding and agreed to proceed.  Chief Complaint  Patient presents with   Follow-up    ER Visit +Covid    History of Present Illness:  ER Covid-19 f/u: 05/21/2021 ER visit. She was discharged on prednisone 50 mg x 5 days. No antivirals.  Generalized myalgia and polyarthralgia as well as severe sore throat.  She had some fever, chills, nasal congestion, cough as well as nausea.  History is significant for pulmonary fibrosis, congestive heart failure.  She did not have evidence of pneumonia on chest x-ray.  Now, ok Monday started to feel better.  Cough remains.   Alb BID, 7 days or so Pain was really bad. Sweating at night.  05/21/2021 was bad Dizzy and clammy  Now she is feeling a lot better.  She is does still have some chest congestion.  Review of Systems as above: See pertinent positives and pertinent negatives per HPI No acute distress verbally   Observations/Objective/Exam:  An attempt was made to discern vital signs over the phone and per  patient if applicable and possible.   General:    Alert, Oriented, appears well and in no acute distress  Pulmonary:     On inspection no signs of respiratory distress.  Psych / Neurological:     Pleasant and cooperative.  Assessment and Plan:    ICD-10-CM   1. COVID-19  U07.1     2. Interstitial lung disease (HCC)  J84.9      She does have some continued coughing and chest congestion, but she has gotten much better since her initial presentation of symptoms.  She resolved on prednisone, albuterol alone and while she was quite sick, she never required oxygen.  I discussed the assessment and treatment plan with the patient. The patient was provided an opportunity to ask questions and all were answered. The patient agreed with the plan and demonstrated an understanding of the instructions.   The patient was advised to call back or seek an in-person evaluation if the symptoms worsen or if the condition fails to improve as anticipated.  Follow-up: prn unless noted otherwise below No follow-ups on file.  No orders of the defined types were placed in this encounter.  No orders of the defined types were placed in this encounter.   Signed,  Elpidio Galea. Flay Ghosh, MD

## 2021-06-04 NOTE — Telephone Encounter (Signed)
Nothing noted in message. Will close encounter.  

## 2021-06-08 ENCOUNTER — Ambulatory Visit: Payer: BC Managed Care – PPO | Admitting: Pulmonary Disease

## 2021-06-08 ENCOUNTER — Encounter: Payer: Self-pay | Admitting: Pulmonary Disease

## 2021-06-08 ENCOUNTER — Other Ambulatory Visit: Payer: Self-pay

## 2021-06-08 VITALS — BP 134/84 | HR 89 | Temp 97.8°F | Ht 63.0 in | Wt 210.2 lb

## 2021-06-08 DIAGNOSIS — R0602 Shortness of breath: Secondary | ICD-10-CM | POA: Diagnosis not present

## 2021-06-08 DIAGNOSIS — Z9189 Other specified personal risk factors, not elsewhere classified: Secondary | ICD-10-CM | POA: Diagnosis not present

## 2021-06-08 DIAGNOSIS — J849 Interstitial pulmonary disease, unspecified: Secondary | ICD-10-CM | POA: Diagnosis not present

## 2021-06-08 DIAGNOSIS — R5383 Other fatigue: Secondary | ICD-10-CM | POA: Diagnosis not present

## 2021-06-08 NOTE — Patient Instructions (Signed)
We will reschedule your sleep study  We will see him in follow-up in 2 to 3 months time call sooner should any new problems arise

## 2021-06-08 NOTE — Progress Notes (Signed)
Subjective:    Patient ID: Anne Rush, female    DOB: [birth year], 60 y.o.   MRN: [id number] Chief Complaint  Patient presents with   Follow-up    C/o sob with incline and prod cough with green sputum.     HPI The patient is a 60 year old former smoker (quit 2012, 15-pack-year history) who presents for follow-up on the issue of interstitial lung disease and dyspnea.  This is a scheduled visit.  She has been previously followed by Dr. Marchelle Gearing, I first evaluated her on 11 March 2021.  She was diagnosed with ILD in 2012, at that time she quit smoking.  Her ILD is clinically not UIP/IPF.  She has declined biopsies or invasive procedures previously.  She has had no evidence of autoimmune disease.  She had prior follow-up at pulmonary clinic at [facility] and was evaluated by Dr. Sandy Salaam. Her diagnosis at [facility] was RB ILD and she initially improved after cessation of smoking.  Most recent CT chest was performed in December 2021 and shows findings compatible with fibrotic interstitial lung disease without frank honeycombing and with a slight upper lung predominance.  These findings appear to have progressed since the remote 2012 chest CT.  But appeared improved from the CT of July 2021.  Differential diagnosis continues to be chronic hypersensitivity pneumonitis, fibrotic NSIP/postinflammatory fibrosis.  Again, not consistent with UIP/IPF.  She had COVID-19 31 August apparently tested with home test.  Not hospitalized.   Patient underwent PFTs on 09 April 2021 for lung function to be stable, FEV1 is 2.37 L or 94% predicted FEV1/FVC is 86%.  Lung volumes were normal.  Diffusion capacity by Kco low end of normal at 84%.    At a prior visit, she had a sleep study ordered however she was unable to have the test done due to cost for a facility sleep test.  The patient is now considering home sleep test.  She continues to complain of fatigue during the day she does not describe this as dyspnea per se.  She continues  to complain of nonrestorative sleep.  She does not endorse fevers, chills or sweats.  Has chronic issues with orthopnea. Mild lower extremity edema at the end of the day.  Usually resolves with elevating lower extremities.  Does have severe gastroesophageal reflux symptoms with occasional choking episodes at night.    Review of Systems A 10 point review of systems was performed and it is as noted above otherwise negative.  Patient Active Problem List   Diagnosis Date Noted   Acute non-recurrent frontal sinusitis 10/06/2021   GERD (gastroesophageal reflux disease) 07/15/2020   Chronic systolic CHF (congestive heart failure) (HCC) 07/15/2020   Interstitial lung disease (HCC) 04/18/2020   Diastolic dysfunction 04/02/2020   Pacemaker 10/01/2018   Vitamin D deficiency 01/11/2018   Allergic angioedema 03/17/2016   Complete heart block (HCC) s/p Pacemaker 02/26/2016   Former tobacco use 08/28/2008   Hyperlipidemia LDL goal <100 06/21/2007   GAD (generalized anxiety disorder) 06/05/2007   Essential hypertension 06/05/2007   ALLERGIC RHINITIS 06/05/2007   Social History   Tobacco Use   Smoking status: Former    Packs/day: 0.50    Years: 30.00    Pack years: 15.00    Types: Cigarettes    Quit date: 2012    Years since quitting: 11.0   Smokeless tobacco: Never  Substance Use Topics   Alcohol use: No   Allergies  Allergen Reactions   Codeine Hives and Itching  Sulfonamide Derivatives Hives   Paroxetine Other (See Comments)    unknown   Lisinopril Rash   Losartan Potassium Other (See Comments)    Leg felt heavy   Neomycin-Polymyxin-Dexameth Itching, Swelling and Other (See Comments)    Ey drop/Swelling and itching of eye    Oxycodone-Acetaminophen Other (See Comments)    Mild SOB, feels funny   Sulfa Antibiotics Hives and Rash        Current Meds  Medication Sig   carvedilol (COREG) 25 MG tablet TAKE ONE TABLET BY MOUTH TWICE A DAY WITH A MEAL   famotidine (PEPCID) 20  MG tablet Take 1 tablet (20 mg total) by mouth daily.   spironolactone (ALDACTONE) 25 MG tablet TAKE ONE TABLET BY MOUTH DAILY   [DISCONTINUED] albuterol (VENTOLIN HFA) 108 (90 Base) MCG/ACT inhaler Inhale 2 puffs into the lungs every 6 (six) hours as needed for wheezing or shortness of breath.   [DISCONTINUED] diazepam (VALIUM) 5 MG tablet Take 1 tablet (5 mg total) by mouth every 8 (eight) hours as needed. for anxiety   [DISCONTINUED] esomeprazole (NEXIUM) 40 MG capsule Take 40 mg by mouth daily as needed.   Immunization History  Administered Date(s) Administered   Influenza Nasal 06/20/2017   Influenza Whole 07/04/2008, 06/18/2009, 08/27/2010   Influenza, Seasonal, Injecte, Preservative Fre 09/03/2010, 06/22/2011, 06/27/2012   Influenza,inj,Quad PF,6+ Mos 07/21/2015, 07/06/2018,   Influenza-Unspecified 06/27/2012, 07/17/2014, 07/19/2016, 07/21/2019   Td 05/10/2002   Tdap 10/28/2014       Objective:   Physical Exam BP 134/84 (BP Location: Left Arm, Cuff Size: Normal)   Pulse 89   Temp 97.8 F (36.6 C) (Temporal)   Ht 5\' 3"  (1.6 m)   Wt 210 lb 3.2 oz (95.3 kg)   SpO2 95%   BMI 37.24 kg/m  GENERAL: Obese woman, no acute distress.  Appears tired.  Fully ambulatory.  No conversational dyspnea. HEAD: Normocephalic, atraumatic.  EYES: Pupils equal, round, reactive to light.  No scleral icterus.  Mild periorbital edema/puffiness. MOUTH: Nose/mouth/throat not examined due to masking requirements for COVID 19. NECK: Supple. No thyromegaly. Trachea midline. No JVD.  No adenopathy. PULMONARY: Good air entry bilaterally.  No adventitious sounds.  Overt crackles. CARDIOVASCULAR: S1 and S2. Regular rate and rhythm.  Pacer dependent.  No murmurs. ABDOMEN: Obese otherwise benign. MUSCULOSKELETAL: No joint deformity, no clubbing, no edema.  NEUROLOGIC: No focal deficit, no gait disturbance, speech is fluent. SKIN: Intact,warm,dry. PSYCH: Mood and behavior normal.    Results of the Epworth  flowsheet 03/11/2021  Sitting and reading 3  Watching TV 0  Sitting, inactive in a public place (e.g. a theatre or a meeting) 0  As a passenger in a car for an hour without a break 1  Lying down to rest in the afternoon when circumstances permit 3  Sitting and talking to someone 0  Sitting quietly after a lunch without alcohol 3  In a car, while stopped for a few minutes in traffic 0  Total score 10        Assessment & Plan:     ICD-10-CM   1. ILD (interstitial lung disease) (HCC)  J84.9    Appears to be RB ILD Superimposed postinflammatory pulmonary fibrosis Had COVID-19 31 August    2. Shortness of breath  R06.02    She describes this more as fatigue I suspect is mostly related to issues with sleep apnea    3. At risk for sleep apnea  Z91.89 Home sleep test   We will  order sleep study    4. Fatigue, unspecified type  R53.83    Suspect related to sleep apnea Patient is a high suspect for sleep apnea     Orders Placed This Encounter  Procedures   Home sleep test    Standing Status:   Future    Number of Occurrences:   1    Standing Expiration Date:   06/08/2022    Order Specific Question:   Where should this test be performed:    Answer:   [facility] - Pulmonary   See the patient in follow-up in 2 to 3 months time she is to contact us prior to that time should any new difficulties arise.  Gailen Shelter, MD Advanced Bronchoscopy PCCM [facility]    *This note was dictated using voice recognition software/Dragon.  Despite best efforts to proofread, errors can occur which can change the meaning. Any transcriptional errors that result from this process are unintentional and may not be fully corrected at the time of dictation.

## 2021-06-09 ENCOUNTER — Other Ambulatory Visit: Payer: Self-pay | Admitting: Pulmonary Disease

## 2021-06-09 MED ORDER — AMOXICILLIN-POT CLAVULANATE 875-125 MG PO TABS: 1.0000 | ORAL_TABLET | Freq: Two times a day (BID) | ORAL | 0 refills | Status: DC

## 2021-06-11 ENCOUNTER — Ambulatory Visit (INDEPENDENT_AMBULATORY_CARE_PROVIDER_SITE_OTHER): Payer: BC Managed Care – PPO

## 2021-06-11 DIAGNOSIS — I442 Atrioventricular block, complete: Secondary | ICD-10-CM

## 2021-06-13 LAB — CUP PACEART REMOTE DEVICE CHECK
Battery Remaining Longevity: 42 mo
Battery Remaining Percentage: 41 %
Battery Voltage: 2.95 V
Brady Statistic AP VP Percent: 1 %
Brady Statistic AP VS Percent: 1 %
Brady Statistic AS VP Percent: 99 %
Brady Statistic AS VS Percent: 1 %
Brady Statistic RA Percent Paced: 1 %
Brady Statistic RV Percent Paced: 99 %
Date Time Interrogation Session: 20220922020015
Implantable Lead Implant Date: 20170608
Implantable Lead Implant Date: 20170608
Implantable Lead Location: 753859
Implantable Lead Location: 753860
Implantable Pulse Generator Implant Date: 20170608
Lead Channel Impedance Value: 400 Ohm
Lead Channel Impedance Value: 410 Ohm
Lead Channel Pacing Threshold Amplitude: 0.75 V
Lead Channel Pacing Threshold Amplitude: 1 V
Lead Channel Pacing Threshold Pulse Width: 0.4 ms
Lead Channel Pacing Threshold Pulse Width: 0.4 ms
Lead Channel Sensing Intrinsic Amplitude: 0.9 mV
Lead Channel Sensing Intrinsic Amplitude: 12 mV
Lead Channel Setting Pacing Amplitude: 2 V
Lead Channel Setting Pacing Amplitude: 2.5 V
Lead Channel Setting Pacing Pulse Width: 0.4 ms
Lead Channel Setting Sensing Sensitivity: 2.5 mV
Pulse Gen Model: 2272
Pulse Gen Serial Number: 7903881

## 2021-06-15 ENCOUNTER — Telehealth: Payer: Self-pay

## 2021-06-15 NOTE — Telephone Encounter (Signed)
Patient called in wanting to go over last transmission she has questions about her battery

## 2021-06-18 ENCOUNTER — Other Ambulatory Visit: Payer: Self-pay | Admitting: Pulmonary Disease

## 2021-06-18 MED ORDER — ESOMEPRAZOLE MAGNESIUM 40 MG PO CPDR: 40.0000 mg | DELAYED_RELEASE_CAPSULE | Freq: Every day | ORAL | 2 refills | Status: DC

## 2021-06-18 NOTE — Progress Notes (Signed)
Remote pacemaker transmission.   

## 2021-06-18 NOTE — Progress Notes (Signed)
Refilled Nexium for another 90 days.  Further refills must come from primary MD.  C. Danice Goltz, MD Advanced Bronchoscopy PCCM [facility]

## 2021-07-07 MED ORDER — PREDNISONE 20 MG PO TABS: ORAL_TABLET | ORAL | 0 refills | Status: DC

## 2021-07-07 NOTE — Telephone Encounter (Signed)
I called pt to schedule a F/U apt and pt stated that she has to check with her husband because she can't drive long distance . I told pt I will reach back out to her tomorrow

## 2021-07-08 NOTE — Telephone Encounter (Signed)
Pt scheduled a f/u apt on 11.2.22

## 2021-07-15 ENCOUNTER — Encounter: Payer: Self-pay | Admitting: Family Medicine

## 2021-07-15 ENCOUNTER — Other Ambulatory Visit: Payer: Self-pay

## 2021-07-15 ENCOUNTER — Ambulatory Visit: Payer: BC Managed Care – PPO | Admitting: Family Medicine

## 2021-07-15 ENCOUNTER — Ambulatory Visit
Admission: RE | Admit: 2021-07-15 | Discharge: 2021-07-15 | Disposition: A | Discharge status: Home / Self Care | Payer: BC Managed Care – PPO | Source: Ambulatory Visit | Attending: Family Medicine | Admitting: Family Medicine

## 2021-07-15 VITALS — BP 140/90 | HR 77 | Temp 97.2°F | Ht 63.0 in | Wt 208.2 lb

## 2021-07-15 DIAGNOSIS — R079 Chest pain, unspecified: Secondary | ICD-10-CM | POA: Diagnosis not present

## 2021-07-15 DIAGNOSIS — U099 Post covid-19 condition, unspecified: Secondary | ICD-10-CM

## 2021-07-15 DIAGNOSIS — J189 Pneumonia, unspecified organism: Secondary | ICD-10-CM | POA: Diagnosis not present

## 2021-07-15 DIAGNOSIS — J849 Interstitial pulmonary disease, unspecified: Secondary | ICD-10-CM | POA: Diagnosis not present

## 2021-07-15 DIAGNOSIS — R059 Cough, unspecified: Secondary | ICD-10-CM | POA: Diagnosis not present

## 2021-07-15 DIAGNOSIS — R0602 Shortness of breath: Secondary | ICD-10-CM | POA: Diagnosis not present

## 2021-07-15 MED ORDER — PREDNISONE 20 MG PO TABS: ORAL_TABLET | ORAL | 0 refills | Status: DC

## 2021-07-15 MED ORDER — LEVOFLOXACIN 500 MG PO TABS: 500.0000 mg | ORAL_TABLET | Freq: Every day | ORAL | 0 refills | Status: DC

## 2021-07-15 NOTE — Progress Notes (Signed)
Anne Rush Anne, MD, CAQ Sports Medicine [facility] at [facility] [address] [place], [address]  Phone: [phone]  FAX: [phone]  Anne Rush - 60 y.o. female  MRN [id number]  Date of Birth: Sep 11, 1961  Date: 07/15/2021  PCP: [name], MD  Referral: [name], MD  Chief Complaint  Patient presents with   Cough   Nasal Congestion         This visit occurred during the SARS-CoV-2 public health emergency.  Safety protocols were in place, including screening questions prior to the visit, additional usage of staff PPE, and extensive cleaning of exam room while observing appropriate contact time as indicated for disinfecting solutions.   Subjective:   Anne Rush is a 60 y.o. very pleasant female patient with Body mass index is 36.89 kg/m. who presents with the following:  SEND DR. GONZALEZ a note  She has a very well-known patient with a 10-year history of pulmonary fibrosis, and she presents today after having COVID-19 in the middle of September.  Prior to this, she never had a COVID-19 vaccine.  Was seen in the ER on May 21, 2021, she was discharged on some steroids, this did seem to help somewhat.  She never received antiviral.  She still feels quite bad.  She also is doing some albuterol, and this has helped some.  is really bad I her nose.  What does spit up is brown.  Green from the nose.  She shows me some pictures on her phone, and there appears to be a large volume of brownish sputum from her cough.  Bad night.  Coughing all the way up.  Deep and painful cough.  Used nasal irrigation.  That is not really working all that well.  Nose is really green.  Blew for about one hour this morning.  Did not do all that much. Tylenol and motrin. Having a rough.   05/21/2021 - concern is from her chest.   Immunization History  Administered Date(s) Administered   Influenza Nasal 06/20/2017   Influenza Whole  07/04/2008, 06/18/2009, 08/27/2010   Influenza, Seasonal, Injecte, Preservative Fre 09/03/2010, 06/22/2011, 06/27/2012   Influenza,inj,Quad PF,6+ Mos 07/21/2015, 07/06/2018   Influenza-Unspecified 06/27/2012, 07/17/2014, 07/19/2016, 07/21/2019   Td 05/10/2002   Tdap 10/28/2014     Review of Systems is noted in the HPI, as appropriate  Patient Active Problem List   Diagnosis Date Noted   Chronic systolic CHF (congestive heart failure) (HCC) 07/15/2020    Priority: 1.   Interstitial lung disease (HCC) 04/18/2020    Priority: 1.   Pacemaker 10/01/2018    Priority: 1.   Complete heart block (HCC) s/p Pacemaker 02/26/2016    Priority: 1.   Hyperlipidemia LDL goal <100 06/21/2007    Priority: 2.   GAD (generalized anxiety disorder) 06/05/2007    Priority: 2.   Essential hypertension 06/05/2007    Priority: 2.   GERD (gastroesophageal reflux disease) 07/15/2020   Diastolic dysfunction 04/02/2020   Vitamin D deficiency 01/11/2018   Allergic angioedema 03/17/2016   Former tobacco use 08/28/2008   ALLERGIC RHINITIS 06/05/2007    Past Medical History:  Diagnosis Date   Chronic systolic CHF (congestive heart failure) (HCC) 07/15/2020   Complete heart block (HCC) 02/26/2016   s/p Pacemaker   Family history of ovarian cancer    Generalized anxiety disorder 07/15/2020   GERD (gastroesophageal reflux disease)    History of kidney stones    Hyperlipidemia  Hypertension    Interstitial lung disease (HCC)    Pollen allergies    Presence of permanent cardiac pacemaker    Pulmonary fibrosis (HCC)    Squamous cell carcinoma 2014   skin cancer   Subclinical hyperthyroidism 06/17/2014    Past Surgical History:  Procedure Laterality Date   CESAREAN SECTION     COSMETIC SURGERY     skin cancer face   CYSTOSCOPY W/ URETERAL STENT PLACEMENT Left 07/15/2020   Procedure: CYSTOSCOPY WITH RETROGRADE PYELOGRAM/URETERAL STENT PLACEMENT;  Surgeon: Riki Altes, MD;  Location: [facility] ORS;   Service: Urology;  Laterality: Left;   CYSTOSCOPY/URETEROSCOPY/HOLMIUM LASER/STENT PLACEMENT Left 07/28/2020   Procedure: CYSTOSCOPY/URETEROSCOPY/HOLMIUM LASER/STENT Exchange;  Surgeon: Vanna Scotland, MD;  Location: [facility] ORS;  Service: Urology;  Laterality: Left;   EP IMPLANTABLE DEVICE N/A 02/26/2016   Procedure: Pacemaker Implant;  Surgeon: Will Jorja Loa, MD;  Location: [facility];  Service: Cardiovascular;  Laterality: N/A;   TUBAL LIGATION      Family History  Problem Relation Age of Onset   Anxiety disorder Mother    Hypertension Mother    Ovarian cancer Mother 28   Cancer Maternal Aunt        breast   Heart failure Maternal Grandfather      Objective:   BP 140/90   Pulse 77   Temp (!) 97.2 F (36.2 C) (Temporal)   Ht 5\' 3"  (1.6 m)   Wt 208 lb 4 oz (94.5 kg)   SpO2 97%   BMI 36.89 kg/m   GEN: No acute distress; alert,appropriate. PULM: Breathing comfortably in no respiratory distress PSYCH: Normally interactive.  CV: RRR, no m/g/r  PULM: Normal respiratory rate, no accessory muscle use. No wheezes, crackles or rhonchi.  Lung exam sounds fairly normal today. ENT exam is normal and no significant lymphadenopathy  Laboratory and Imaging Data:   Assessment and Plan:     ICD-10-CM   1. Post-COVID syndrome  U09.9 DG Chest 2 View    2. Interstitial lung disease (HCC)  J84.9 DG Chest 2 View    3. Secondary pneumonia  J18.9 DG Chest 2 View     Approximate 7-week history of post COVID.  This is in a case of a pulmonary fibrosis patient with no prior vaccination and no antiviral use.  She continues to be quite symptomatic.  She is not having a fever, but she has a productive cough essentially all of the time.  She did feel somewhat better on some prednisone.  I am going to extend her prednisone now.  With a risk of secondary infection, I am going to go ahead and get a chest x-ray and start her antibiotics.  Unfortunately, our x-ray tech is out with family  right now, and I am going to have her do her film at [facility] imaging today.  Question long COVID.  I also wanted to send this note to her pulmonologist as well.  Meds ordered this encounter  Medications   levofloxacin (LEVAQUIN) 500 MG tablet    Sig: Take 1 tablet (500 mg total) by mouth daily.    Dispense:  10 tablet    Refill:  0   predniSONE (DELTASONE) 20 MG tablet    Sig: 2 tabs po for 7 days, then 1 tab po for 7 days    Dispense:  21 tablet    Refill:  0   Medications Discontinued During This Encounter  Medication Reason   amoxicillin-clavulanate (AUGMENTIN) 875-125 MG tablet Completed Course  predniSONE (DELTASONE) 20 MG tablet    Orders Placed This Encounter  Procedures   DG Chest 2 View    Follow-up: No follow-ups on file.  Dragon [employer] One speech-to-text software was used for transcription in this dictation.  Possible transcriptional errors can occur using Animal nutritionist.   Signed,  Elpidio Galea. Cristo Ausburn, MD   Outpatient Encounter Medications as of 07/15/2021  Medication Sig   albuterol (VENTOLIN HFA) 108 (90 Base) MCG/ACT inhaler Inhale 2 puffs into the lungs every 6 (six) hours as needed for wheezing or shortness of breath.   carvedilol (COREG) 25 MG tablet TAKE ONE TABLET BY MOUTH TWICE A DAY WITH A MEAL   diazepam (VALIUM) 5 MG tablet Take 1 tablet (5 mg total) by mouth every 8 (eight) hours as needed. for anxiety   esomeprazole (NEXIUM) 40 MG capsule Take 1 capsule (40 mg total) by mouth daily at 12 noon.   famotidine (PEPCID) 20 MG tablet Take 1 tablet (20 mg total) by mouth daily.   levofloxacin (LEVAQUIN) 500 MG tablet Take 1 tablet (500 mg total) by mouth daily.   predniSONE (DELTASONE) 20 MG tablet 2 tabs po for 7 days, then 1 tab po for 7 days   spironolactone (ALDACTONE) 25 MG tablet TAKE ONE TABLET BY MOUTH DAILY   [DISCONTINUED] predniSONE (DELTASONE) 20 MG tablet 2 tabs po daily for 5 days, then 1 tab po daily for 5 days   [DISCONTINUED]  amoxicillin-clavulanate (AUGMENTIN) 875-125 MG tablet Take 1 tablet by mouth 2 (two) times daily.   Facility-Administered Encounter Medications as of 07/15/2021  Medication   albuterol (PROVENTIL) (2.5 MG/3ML) 0.083% nebulizer solution 2.5 mg

## 2021-07-17 NOTE — Telephone Encounter (Signed)
I received teams asking me to triage pt but I see Dr Brayton Layman note and will do as Dr Patsy Lager advised that no triage is needed for this pt. Thank you.

## 2021-07-17 NOTE — Telephone Encounter (Signed)
There is no need to triage this patient.  She has pulmonary fibrosis x 10 years, this is no different from my exam this week.  On maximal antibiotic and steroid doses.  Her pulmonologist is also aware.

## 2021-07-20 NOTE — Telephone Encounter (Signed)
Can you check on this x-ray?

## 2021-07-22 ENCOUNTER — Ambulatory Visit: Payer: BC Managed Care – PPO | Admitting: Family Medicine

## 2021-08-10 ENCOUNTER — Telehealth: Payer: Self-pay | Admitting: Pulmonary Disease

## 2021-08-10 ENCOUNTER — Other Ambulatory Visit: Payer: Self-pay

## 2021-08-10 ENCOUNTER — Ambulatory Visit (INDEPENDENT_AMBULATORY_CARE_PROVIDER_SITE_OTHER): Payer: BC Managed Care – PPO | Admitting: Pulmonary Disease

## 2021-08-10 ENCOUNTER — Ambulatory Visit
Admission: RE | Admit: 2021-08-10 | Discharge: 2021-08-10 | Disposition: A | Discharge status: Home / Self Care | Payer: BC Managed Care – PPO | Source: Ambulatory Visit | Attending: Pulmonary Disease | Admitting: Pulmonary Disease

## 2021-08-10 ENCOUNTER — Ambulatory Visit
Admission: RE | Admit: 2021-08-10 | Discharge: 2021-08-10 | Disposition: A | Discharge status: Home / Self Care | Payer: BC Managed Care – PPO | Attending: Pulmonary Disease | Admitting: Pulmonary Disease

## 2021-08-10 ENCOUNTER — Encounter: Payer: Self-pay | Admitting: Pulmonary Disease

## 2021-08-10 VITALS — BP 122/70 | HR 77 | Temp 97.1°F | Ht 63.0 in | Wt 210.0 lb

## 2021-08-10 DIAGNOSIS — R0602 Shortness of breath: Secondary | ICD-10-CM

## 2021-08-10 DIAGNOSIS — Z8616 Personal history of COVID-19: Secondary | ICD-10-CM

## 2021-08-10 DIAGNOSIS — Z9189 Other specified personal risk factors, not elsewhere classified: Secondary | ICD-10-CM

## 2021-08-10 DIAGNOSIS — Z23 Encounter for immunization: Secondary | ICD-10-CM

## 2021-08-10 DIAGNOSIS — J849 Interstitial pulmonary disease, unspecified: Secondary | ICD-10-CM | POA: Diagnosis not present

## 2021-08-10 DIAGNOSIS — R5383 Other fatigue: Secondary | ICD-10-CM | POA: Diagnosis not present

## 2021-08-10 NOTE — Patient Instructions (Signed)
We are going to order chest x-ray to follow-up on the 1 you had in October as it did show a patch of pneumonia.  We will schedule your home sleep study after the holidays per your request.   CM follow-up in 3 months time call sooner should any new problems arise.  We will call you with the results of the studies as they come back.

## 2021-08-10 NOTE — Progress Notes (Signed)
Subjective:    Patient ID: Anne Rush, female    DOB: 1961-08-02, 60 y.o.   MRN: [id number] Chief Complaint  Patient presents with   Follow-up    ILD-, SOB    HPI Patient is a 60 year old former smoker (quit 2012, 15-pack-year history) who presents for follow-up for interstitial lung disease and fatigue.  This is a scheduled visit.  She has been previously diagnosed with ILD in 2012 clinically following RB ILD type parameters.  She quit smoking in 2012 once this was diagnosed.  This was diagnosed at [facility].  The patient has been reluctant to undergo biopsies.  She was then followed by Dr. Marchelle Gearing who we evaluated her and found no evidence of connective tissue disease triggering her ILD.  Her chest imaging has been stable most recently performed in December 2021 showing improvement from a CT of July 2021.  Differential diagnosis continues to be chronic hypersensitivity pneumonitis fibrotic NSIP/postinflammatory fibrosis.  Not consistent with UIP or IPF.  She has had COVID-19 on 31 August with a relapse in October.  She was told she had "pneumonia on her October chest x-ray.  She is concerned and wants to make sure this has cleared.  I think this is reasonable.  She has not had a home sleep study as she would want to wait till after the holidays.  She has a very high suspect for obstructive sleep apnea and I have recommended she have this sleep study as soon as feasible.  She does not endorse any new symptomatology.  No fevers, chills or sweats.  No increased fatigue over baseline.  Continues to have issues with poor sleep quality.  She desires to have flu vaccine today.   Review of Systems A 10 point review of systems was performed and it is as noted above otherwise negative.  Patient Active Problem List   Diagnosis Date Noted   Acute non-recurrent frontal sinusitis 10/06/2021   GERD (gastroesophageal reflux disease) 07/15/2020   Chronic systolic CHF (congestive heart failure) (HCC)  07/15/2020   Interstitial lung disease (HCC) 04/18/2020   Diastolic dysfunction 04/02/2020   Pacemaker 10/01/2018   Vitamin D deficiency 01/11/2018   Allergic angioedema 03/17/2016   Complete heart block (HCC) s/p Pacemaker 02/26/2016   Former tobacco use 08/28/2008   Hyperlipidemia LDL goal <100 06/21/2007   GAD (generalized anxiety disorder) 06/05/2007   Essential hypertension 06/05/2007   ALLERGIC RHINITIS 06/05/2007   Social History   Tobacco Use   Smoking status: Former    Packs/day: 0.50    Years: 30.00    Pack years: 15.00    Types: Cigarettes    Quit date: 2012    Years since quitting: 11.0   Smokeless tobacco: Never  Substance Use Topics   Alcohol use: No   Allergies  Allergen Reactions   Codeine Hives and Itching   Sulfonamide Derivatives Hives   Paroxetine Other (See Comments)    unknown   Lisinopril Rash   Losartan Potassium Other (See Comments)    Leg felt heavy   Neomycin-Polymyxin-Dexameth Itching, Swelling and Other (See Comments)    Ey drop/Swelling and itching of eye    Oxycodone-Acetaminophen Other (See Comments)    Mild SOB, feels funny   Sulfa Antibiotics Hives and Rash        Current Meds  Medication Sig   carvedilol (COREG) 25 MG tablet TAKE ONE TABLET BY MOUTH TWICE A DAY WITH A MEAL   esomeprazole (NEXIUM) 40 MG capsule Take 1 capsule (40  mg total) by mouth daily at 12 noon.   famotidine (PEPCID) 20 MG tablet Take 1 tablet (20 mg total) by mouth daily.   spironolactone (ALDACTONE) 25 MG tablet TAKE ONE TABLET BY MOUTH DAILY   [DISCONTINUED] albuterol (VENTOLIN HFA) 108 (90 Base) MCG/ACT inhaler Inhale 2 puffs into the lungs every 6 (six) hours as needed for wheezing or shortness of breath.   [DISCONTINUED] diazepam (VALIUM) 5 MG tablet Take 1 tablet (5 mg total) by mouth every 8 (eight) hours as needed. for anxiety   [DISCONTINUED] levofloxacin (LEVAQUIN) 500 MG tablet Take 1 tablet (500 mg total) by mouth daily.   [DISCONTINUED]  predniSONE (DELTASONE) 20 MG tablet 2 tabs po for 7 days, then 1 tab po for 7 days   Immunization History  Administered Date(s) Administered   Influenza Nasal 06/20/2017   Influenza Whole 07/04/2008, 06/18/2009, 08/27/2010   Influenza, Seasonal, Injecte, Preservative Fre 09/03/2010, 06/22/2011, 06/27/2012   Influenza,inj,Quad PF,6+ Mos 07/21/2015, 07/06/2018, 08/10/2021   Influenza-Unspecified 06/27/2012, 07/17/2014, 07/19/2016, 07/21/2019   Td 05/10/2002   Tdap 10/28/2014       Objective:   Physical Exam BP 122/70 (BP Location: Left Arm, Patient Position: Sitting, Cuff Size: Normal)   Pulse 77   Temp (!) 97.1 F (36.2 C) (Oral)   Ht 5\' 3"  (1.6 m)   Wt 210 lb (95.3 kg)   SpO2 97%   BMI 37.20 kg/m  GENERAL: Obese woman, no acute distress.  Appears fatigued.  Fully ambulatory.  No conversational dyspnea.  HEAD: Normocephalic, atraumatic.  EYES: Pupils equal, round, reactive to light.  No scleral icterus.  Mild periorbital edema/puffiness. MOUTH: Nose/mouth/throat not examined due to masking requirements for COVID 19. NECK: Supple. No thyromegaly. Trachea midline. No JVD.  No adenopathy. PULMONARY: Good air entry bilaterally.  No adventitious sounds.  Overt crackles. CARDIOVASCULAR: S1 and S2. Regular rate and rhythm.  Pacer dependent.  No murmurs. ABDOMEN: Obese otherwise benign. MUSCULOSKELETAL: No joint deformity,+ clubbing (mild), no edema.  NEUROLOGIC: No focal deficit, no gait disturbance, speech is fluent. SKIN: Intact,warm,dry. PSYCH: Mood and behavior normal.  Results of the Epworth flowsheet 03/11/2021  Sitting and reading 3  Watching TV 0  Sitting, inactive in a public place (e.g. a theatre or a meeting) 0  As a passenger in a car for an hour without a break 1  Lying down to rest in the afternoon when circumstances permit 3  Sitting and talking to someone 0  Sitting quietly after a lunch without alcohol 3  In a car, while stopped for a few minutes in traffic 0   Total score 10     Chest x-ray performed today, no evidence of acute disease, chronic fibrosis changes upper lobes:      Assessment & Plan:     ICD-10-CM   1. ILD (interstitial lung disease) (HCC)  J84.9    Not UIP/IPF Likely related to past RB ILD Cannot exclude post inflammatory pulmonary fibrosis Chest x-ray today back to baseline    2. Shortness of breath  R06.02 DG Chest 2 View   Described more as fatigue Increase over baseline    3. Fatigue, unspecified type  R53.83    Suspect related to poor sleep quality     4. At risk for sleep apnea  Z91.89    Epworth score 10 previously Home sleep study after the holidays per her request    5. Personal history of COVID-19  Z86.16    Had COVID x2 August and then relapse in October  This issue adds complexity to her management    6. Need for immunization against influenza  Z23 Flu Vaccine QUAD 82mo+IM (Fluarix, Fluzone & Alfiuria Quad PF)   Received flu vaccine today     Orders Placed This Encounter  Procedures   DG Chest 2 View    Standing Status:   Future    Number of Occurrences:   1    Standing Expiration Date:   08/10/2022    Order Specific Question:   Reason for Exam (SYMPTOM  OR DIAGNOSIS REQUIRED)    Answer:   sob    Order Specific Question:   Preferred imaging location?    Answer:   [facility]    Order Specific Question:   Is patient pregnant?    Answer:   No   Flu Vaccine QUAD 65mo+IM (Fluarix, Fluzone & Alfiuria Quad PF)   See the patient in follow-up in 3 months time she is to call sooner should any new difficulties arise.  Gailen Shelter, MD Advanced Bronchoscopy PCCM [facility]-    *This note was dictated using voice recognition software/Dragon.  Despite best efforts to proofread, errors can occur which can change the meaning. Any transcriptional errors that result from this process are unintentional and may not be fully corrected at the time of dictation.

## 2021-08-10 NOTE — Telephone Encounter (Signed)
Anne Saner, MD  08/10/2021  3:02 PM EST     The infiltrate that she had from COVID-pneumonia has cleared.  Chest x-ray looked good.   Patient is aware of results and voiced her understanding. Nothing further needed.

## 2021-09-09 ENCOUNTER — Other Ambulatory Visit: Payer: Self-pay | Admitting: Family Medicine

## 2021-09-09 NOTE — Telephone Encounter (Signed)
Last office visit 07/15/2021 for post Covid syndrome.  Last refilled 04/15/2021 for #30 with 3 refills.  CPE scheduled for 10/26/2021.

## 2021-09-10 ENCOUNTER — Ambulatory Visit (INDEPENDENT_AMBULATORY_CARE_PROVIDER_SITE_OTHER): Payer: BC Managed Care – PPO

## 2021-09-10 DIAGNOSIS — I442 Atrioventricular block, complete: Secondary | ICD-10-CM

## 2021-09-11 LAB — CUP PACEART REMOTE DEVICE CHECK
Battery Remaining Longevity: 40 mo
Battery Remaining Percentage: 38 %
Battery Voltage: 2.93 V
Brady Statistic AP VP Percent: 1 %
Brady Statistic AP VS Percent: 1 %
Brady Statistic AS VP Percent: 99 %
Brady Statistic AS VS Percent: 1 %
Brady Statistic RA Percent Paced: 1 %
Brady Statistic RV Percent Paced: 99 %
Date Time Interrogation Session: 20221223113501
Implantable Lead Implant Date: 20170608
Implantable Lead Implant Date: 20170608
Implantable Lead Location: 753859
Implantable Lead Location: 753860
Implantable Pulse Generator Implant Date: 20170608
Lead Channel Impedance Value: 410 Ohm
Lead Channel Impedance Value: 410 Ohm
Lead Channel Pacing Threshold Amplitude: 0.75 V
Lead Channel Pacing Threshold Amplitude: 1 V
Lead Channel Pacing Threshold Pulse Width: 0.4 ms
Lead Channel Pacing Threshold Pulse Width: 0.4 ms
Lead Channel Sensing Intrinsic Amplitude: 12 mV
Lead Channel Sensing Intrinsic Amplitude: 3.7 mV
Lead Channel Setting Pacing Amplitude: 2 V
Lead Channel Setting Pacing Amplitude: 2.5 V
Lead Channel Setting Pacing Pulse Width: 0.4 ms
Lead Channel Setting Sensing Sensitivity: 2.5 mV
Pulse Gen Model: 2272
Pulse Gen Serial Number: 7903881

## 2021-09-22 NOTE — Progress Notes (Signed)
Remote pacemaker transmission.   

## 2021-09-28 ENCOUNTER — Ambulatory Visit (INDEPENDENT_AMBULATORY_CARE_PROVIDER_SITE_OTHER): Payer: BC Managed Care – PPO

## 2021-09-28 ENCOUNTER — Other Ambulatory Visit: Payer: Self-pay

## 2021-09-28 DIAGNOSIS — G4733 Obstructive sleep apnea (adult) (pediatric): Secondary | ICD-10-CM | POA: Diagnosis not present

## 2021-09-28 DIAGNOSIS — Z9189 Other specified personal risk factors, not elsewhere classified: Secondary | ICD-10-CM

## 2021-10-02 DIAGNOSIS — G4733 Obstructive sleep apnea (adult) (pediatric): Secondary | ICD-10-CM

## 2021-10-05 ENCOUNTER — Telehealth: Payer: Self-pay | Admitting: Pulmonary Disease

## 2021-10-05 DIAGNOSIS — G4730 Sleep apnea, unspecified: Secondary | ICD-10-CM

## 2021-10-05 NOTE — Telephone Encounter (Signed)
Called and spoke to patient and let her know that HST results do not transfer to My chart, patient had a clear understanding, I did let her know that I could print them off for her but patient did not want a printed copy. Nothing further needed,

## 2021-10-05 NOTE — Telephone Encounter (Signed)
VS has read sleep study and it has been scanned to chart.

## 2021-10-05 NOTE — Telephone Encounter (Signed)
Her sleep study shows that she has SEVERE sleep apnea.  Her oxygen level also drops quite low during these episodes.  She will need to have an in lab CPAP titration study to determine if she needs CPAP or BiPAP because of the severity of her sleep apnea.

## 2021-10-05 NOTE — Telephone Encounter (Signed)
Called and spoke to patient about test results, I have placed the order for CPAP titration study, nothing further needed.

## 2021-10-06 ENCOUNTER — Telehealth: Payer: BC Managed Care – PPO | Admitting: Family Medicine

## 2021-10-06 ENCOUNTER — Telehealth: Payer: Self-pay | Admitting: Family Medicine

## 2021-10-06 ENCOUNTER — Encounter: Payer: Self-pay | Admitting: Family Medicine

## 2021-10-06 ENCOUNTER — Encounter: Payer: Self-pay | Admitting: Pulmonary Disease

## 2021-10-06 ENCOUNTER — Other Ambulatory Visit: Payer: Self-pay

## 2021-10-06 VITALS — BP 125/89 | HR 87 | Temp 95.5°F | Ht 63.0 in | Wt 220.0 lb

## 2021-10-06 DIAGNOSIS — J0110 Acute frontal sinusitis, unspecified: Secondary | ICD-10-CM | POA: Diagnosis not present

## 2021-10-06 MED ORDER — DOXYCYCLINE HYCLATE 100 MG PO TABS: 100.0000 mg | ORAL_TABLET | Freq: Two times a day (BID) | ORAL | 0 refills | Status: DC

## 2021-10-06 NOTE — Progress Notes (Signed)
VIRTUAL VISIT Due to national recommendations of social distancing due to COVID 19, a virtual visit is felt to be most appropriate for this patient at this time.   I connected with the patient on 10/06/21 at 12:00 PM EST by virtual telehealth platform and verified that I am speaking with the correct person using two identifiers.   I discussed the limitations, risks, security and privacy concerns of performing an evaluation and management service by  virtual telehealth platform and the availability of in person appointments. I also discussed with the patient that there may be a patient responsible charge related to this service. The patient expressed understanding and agreed to proceed.  Patient location: Home Provider Location: [place] [facility] Participants: Kerby Nora and Dietrich Pates Martensen   Chief Complaint  Patient presents with   Nasal Congestion    Symptoms started Sunday Negative Home Covid Test Yesterday    Cough    Dry   Sinus Drainage    History of Present Illness:  61 year old female patient of Dr. Cyndie Chime with history of  HTN, Chronic CHF, and interstitial lung disease present with  new onset  nasal congestion.  Started with head congestion, cough dry, sinus drainage is green and thick.  She has chronic cough from her ILD.  She has post nasal drainage. No ear pain.  Pressure in forehead, no unilateral sinus pain.  No ST.  No SOB, no wheeze  No fever. No chills.  No body aches.   Date of Onset: 10/03/2021   She has been using nasal rinse ans tylenol.   Date of onset  COVID 75 screen COVID testing:neg 1/16 COVID vaccine: none COVID exposure: No recent travel or known exposure to COVID19, flu  The importance of social distancing was discussed today.    Review of Systems  Constitutional:  Negative for chills and fever.  HENT:  Positive for congestion and sinus pain. Negative for ear pain and sore throat.   Eyes:  Negative for pain and redness.   Respiratory:  Positive for cough. Negative for shortness of breath.   Cardiovascular:  Negative for chest pain, palpitations and leg swelling.  Gastrointestinal:  Negative for abdominal pain, blood in stool, constipation, diarrhea, nausea and vomiting.  Genitourinary:  Negative for dysuria.  Musculoskeletal:  Negative for falls and myalgias.  Skin:  Negative for rash.  Neurological:  Negative for dizziness.  Psychiatric/Behavioral:  Negative for depression. The patient is not nervous/anxious.      Past Medical History:  Diagnosis Date   Chronic systolic CHF (congestive heart failure) (HCC) 07/15/2020   Complete heart block (HCC) 02/26/2016   s/p Pacemaker   Family history of ovarian cancer    Generalized anxiety disorder 07/15/2020   GERD (gastroesophageal reflux disease)    History of kidney stones    Hyperlipidemia    Hypertension    Interstitial lung disease (HCC)    Pollen allergies    Presence of permanent cardiac pacemaker    Pulmonary fibrosis (HCC)    Squamous cell carcinoma 2014   skin cancer   Subclinical hyperthyroidism 06/17/2014    reports that she quit smoking about 11 years ago. Her smoking use included cigarettes. She has a 15.00 pack-year smoking history. She has never used smokeless tobacco. She reports that she does not drink alcohol and does not use drugs.   Current Outpatient Medications:    carvedilol (COREG) 25 MG tablet, TAKE ONE TABLET BY MOUTH TWICE A DAY WITH A MEAL, Disp: 180 tablet,  Rfl: 2   diazepam (VALIUM) 5 MG tablet, TAKE ONE TABLET BY MOUTH EVERY 8 HOURS AS NEEDED FOR ANXIETY, Disp: 30 tablet, Rfl: 3   esomeprazole (NEXIUM) 40 MG capsule, Take 1 capsule (40 mg total) by mouth daily at 12 noon., Disp: 30 capsule, Rfl: 2   famotidine (PEPCID) 20 MG tablet, Take 1 tablet (20 mg total) by mouth daily., Disp: 30 tablet, Rfl: 0   spironolactone (ALDACTONE) 25 MG tablet, TAKE ONE TABLET BY MOUTH DAILY, Disp: 90 tablet, Rfl: 3 No current  facility-administered medications for this visit.  Facility-Administered Medications Ordered in Other Visits:    albuterol (PROVENTIL) (2.5 MG/3ML) 0.083% nebulizer solution 2.5 mg, 2.5 mg, Nebulization, Once, Kalman Shan, MD   Observations/Objective: Blood pressure 125/89, pulse 87, temperature (!) 95.5 F (35.3 C), temperature source Oral, height 5\' 3"  (1.6 m), weight 220 lb (99.8 kg), SpO2 95 %.  Physical Exam  Physical Exam Constitutional:      General: The patient is not in acute distress. Pulmonary:     Effort: Pulmonary effort is normal. No respiratory distress.  Neurological:     Mental Status: The patient is alert and oriented to person, place, and time.  Psychiatric:        Mood and Affect: Mood normal.        Behavior: Behavior normal.   Assessment and Plan Problem List Items Addressed This Visit     Acute non-recurrent frontal sinusitis - Primary    Negative COVID on day 2 of illness.. consider repeat test on day 4 .. call if positive.  Not consistent with flu.  Most likely viral sinusitis, but in pt with high risk for lower respiratory infection. Continue nasal sinus rinse and tylenol. Add Mucinex DM as well as Flonase.  Reviewed need for avoidance of unnecessary antibiotic use/antibiotic stewardship as well as noted concern for increase risk of lower respiratory infection with history of ILD.  If new fever or  not improving by day 5-7 of illness fill antibiotics.  Pt has multiple allergies to antibiotics and reports Amox and Augmentin no longer work for her.  Sent in Rx for doxy to use if not improving as expected after 5-7 days of illness.      Relevant Medications   doxycycline (VIBRA-TABS) 100 MG tablet   Meds ordered this encounter  Medications   doxycycline (VIBRA-TABS) 100 MG tablet    Sig: Take 1 tablet (100 mg total) by mouth 2 (two) times daily. Fill if not improving after 5-7 days of illness.    Dispense:  20 tablet    Refill:  0      I  discussed the assessment and treatment plan with the patient. The patient was provided an opportunity to ask questions and all were answered. The patient agreed with the plan and demonstrated an understanding of the instructions.   The patient was advised to call back or seek an in-person evaluation if the symptoms worsen or if the condition fails to improve as anticipated.     , MD

## 2021-10-06 NOTE — Patient Instructions (Signed)
Negative COVID on day 2 of illness.. consider repeat test on day 4 .. call if positive. ° Not consistent with flu. ° °Most likely viral sinusitis, but in pt with high risk for lower respiratory infection. °Continue nasal sinus rinse and tylenol. Add Mucinex DM as well as Flonase. ° Reviewed need for avoidance of unnecessary antibiotic use/antibiotic stewardship as well as noted concern for increase risk of lower respiratory infection with history of ILD. ° If new fever or  not improving by day 5-7 of illness fill antibiotics. ° Pt has multiple allergies to antibiotics and reports Amox and Augmentin no longer work for her. ° Sent in Rx for doxy to use if not improving as expected after 5-7 days of illness. °

## 2021-10-06 NOTE — Telephone Encounter (Signed)
I spoke with pt; on 10/04/21 pt started with head congestion and when blows nose has clear to green mucus. Pt has dry cough,slight runny nose, pt usually has SOB upon exertion and CP on and off. SOB and CP is no worse than usual. Per pt no fever, chills, body aches, diarrhea, vomiting, no wheezing and no S/T. Pt recently did covid test that was neg. Pt said she has hx of pneumonia,pulmonary fibrosis and sleep apnea. I asked pt if she had contacted pulmonology and she said no because they are so busy and pt is not going where there are covid pts. Pt scheduled VV with Dr Ermalene Searing 10/06/21 at 12 noon. Pt will have vitals ready when CMA calls. Pt said she may go to sleep  unexpected and I advised to have phone near her or set alarm. Pt voiced understanding.UC & ED precautions given and pt voiced understanding. Sending note to Dr Ermalene Searing and Lupita Leash CMA and will teams Lupita Leash.

## 2021-10-06 NOTE — Assessment & Plan Note (Signed)
Negative COVID on day 2 of illness.. consider repeat test on day 4 .. call if positive.  Not consistent with flu.  Most likely viral sinusitis, but in pt with high risk for lower respiratory infection. Continue nasal sinus rinse and tylenol. Add Mucinex DM as well as Flonase.  Reviewed need for avoidance of unnecessary antibiotic use/antibiotic stewardship as well as noted concern for increase risk of lower respiratory infection with history of ILD.  If new fever or  not improving by day 5-7 of illness fill antibiotics.  Pt has multiple allergies to antibiotics and reports Amox and Augmentin no longer work for her.  Sent in Rx for doxy to use if not improving as expected after 5-7 days of illness.

## 2021-10-06 NOTE — Telephone Encounter (Signed)
Rena, Can you please call and triage Anne Rush.

## 2021-10-06 NOTE — Telephone Encounter (Signed)
Pt called, she stated that she is having congestion and headaches and only wanted antibiotic called in. I tried to schedule her an appt and she said " that I guess I'll just die" And hung the phone up.

## 2021-10-08 ENCOUNTER — Encounter: Payer: Self-pay | Admitting: Family Medicine

## 2021-10-19 ENCOUNTER — Emergency Department (HOSPITAL_COMMUNITY)
Admission: EM | Admit: 2021-10-19 | Discharge: 2021-10-20 | Disposition: A | Discharge status: Home / Self Care | Payer: BC Managed Care – PPO | Attending: Emergency Medicine | Admitting: Emergency Medicine

## 2021-10-19 ENCOUNTER — Emergency Department (HOSPITAL_COMMUNITY): Payer: BC Managed Care – PPO

## 2021-10-19 ENCOUNTER — Encounter: Payer: BC Managed Care – PPO | Admitting: Family Medicine

## 2021-10-19 ENCOUNTER — Encounter (HOSPITAL_COMMUNITY): Payer: Self-pay

## 2021-10-19 ENCOUNTER — Other Ambulatory Visit: Payer: BC Managed Care – PPO

## 2021-10-19 ENCOUNTER — Telehealth: Payer: Self-pay

## 2021-10-19 DIAGNOSIS — R0602 Shortness of breath: Secondary | ICD-10-CM | POA: Insufficient documentation

## 2021-10-19 DIAGNOSIS — R072 Precordial pain: Secondary | ICD-10-CM | POA: Insufficient documentation

## 2021-10-19 DIAGNOSIS — Z950 Presence of cardiac pacemaker: Secondary | ICD-10-CM | POA: Diagnosis not present

## 2021-10-19 DIAGNOSIS — R9431 Abnormal electrocardiogram [ECG] [EKG]: Secondary | ICD-10-CM | POA: Diagnosis not present

## 2021-10-19 DIAGNOSIS — R0789 Other chest pain: Secondary | ICD-10-CM

## 2021-10-19 LAB — CBC
HCT: 41.9 % (ref 36.0–46.0)
Hemoglobin: 14.8 g/dL (ref 12.0–15.0)
MCH: 31.9 pg (ref 26.0–34.0)
MCHC: 35.3 g/dL (ref 30.0–36.0)
MCV: 90.3 fL (ref 80.0–100.0)
Platelets: 239 10*3/uL (ref 150–400)
RBC: 4.64 MIL/uL (ref 3.87–5.11)
RDW: 11.9 % (ref 11.5–15.5)
WBC: 6.8 10*3/uL (ref 4.0–10.5)
nRBC: 0 % (ref 0.0–0.2)

## 2021-10-19 LAB — TROPONIN I (HIGH SENSITIVITY)
Troponin I (High Sensitivity): 4 ng/L (ref <18)
Troponin I (High Sensitivity): 5 ng/L (ref <18)

## 2021-10-19 LAB — BASIC METABOLIC PANEL
Anion gap: 8 (ref 5–15)
BUN: 14 mg/dL (ref 6–20)
CO2: 27 mmol/L (ref 22–32)
Calcium: 9.7 mg/dL (ref 8.9–10.3)
Chloride: 101 mmol/L (ref 98–111)
Creatinine, Ser: 0.68 mg/dL (ref 0.44–1.00)
GFR, Estimated: >60 mL/min (ref >60)
Glucose, Bld: 84 mg/dL (ref 70–99)
Potassium: 4.5 mmol/L (ref 3.5–5.1)
Sodium: 136 mmol/L (ref 135–145)

## 2021-10-19 NOTE — ED Triage Notes (Addendum)
Patient ambulatory to triage booth. C/o fatigue and chest pain that began yesterday. Patient took pulse at home and noted it to be 46. Called cardiologist who advised her to come to the ER. Patient also endorses increase in SOB with walking than normal. Patient has had pacemaker since 2017.  Hx of complete heart block.

## 2021-10-19 NOTE — Telephone Encounter (Signed)
The patient states her chest been hurting since yesterday. Her blood pressure been wonky and her oxygen was at 100. I had the patient send a manual transmission with her home remote monitor. She is currently having chest pains.

## 2021-10-19 NOTE — ED Provider Triage Note (Signed)
Emergency Medicine Provider Triage Evaluation Note  Anne Rush , a 61 y.o. female  was evaluated in triage.  Pt complains of chest pain, fatigue, shortness of breath starting yesterday.  Patient is a history of pulmonary fibrosis and regular checks her oxygen at home.  She states that she checked it yesterday and noted that her pulse gradually decreased to 46.  She called her cardiologist who advised that she come to the emergency department.  She also notes that she has been more short of breath with exertion than normal.  She does have a pacemaker, placed in 2017 as she has a history of a complete heart block.  She recently finished a course of doxycycline after follow-up for long-haul COVID and prevention of pneumonia, she finished this yesterday.  Review of Systems  Positive: Chest pain, shortness of breath, fatigue Negative: Fever, chills, cough, congestion  Physical Exam  BP (!) 166/98 (BP Location: Right Arm)    Pulse 78    Temp 97.8 F (36.6 C) (Oral)    Resp 16    SpO2 96%  Gen:   Awake, no distress   Resp:  Normal effort  MSK:   Moves extremities without difficulty  Other:    Medical Decision Making  Medically screening exam initiated at 7:40 PM.  Appropriate orders placed.  Mariaelizabeth Szczesniak Mcewen was informed that the remainder of the evaluation will be completed by another provider, this initial triage assessment does not replace that evaluation, and the importance of remaining in the ED until their evaluation is complete.     Kateri Plummer, PA-C 10/19/21 1950

## 2021-10-19 NOTE — Telephone Encounter (Signed)
Remote transmission reviewed, presenting rhythm AS/VS 83 bpm.  AT/AF burden <1%, no EGM available.   Patient reports of chest pain across chest that started last night, currently 7/10. Denies radiation of pain, reports as dull and worse with inspiration. Patient advised she needs to seek emergency department for evaluation of chest pain. Advised not to drive. Patient verbalized understanding and agreeable to plan.

## 2021-10-20 MED ORDER — NAPROXEN 500 MG PO TABS: 500.0000 mg | ORAL_TABLET | Freq: Two times a day (BID) | ORAL | 0 refills | Status: DC

## 2021-10-20 NOTE — ED Provider Notes (Signed)
EMERGENCY DEPARTMENT Provider Note    CSN: [id number] Arrival date & time: 10/19/21 1847  History Chief Complaint  Patient presents with   Chest Pain    Anne Rush is a 61 y.o. female with history of pulmonary fibrosis from smoking and complete heart block with pacemaker reports she has had some increased SOB and mid- and L-sternal chest pains for 2 days. Worse with deep breath. SOB is a little worse with going up stairs but that is not unusual for her. No cough or fever. She reported low HR on home pulse-ox yesterday. Called Cardiology office and advised to come to the ED. Remote device interrogation reported in Cardiology note from yesterday afternoon did not show any concerning dysrhythmias.    Home Medications Prior to Admission medications   Medication Sig Start Date End Date Taking? Authorizing Provider  naproxen (NAPROSYN) 500 MG tablet Take 1 tablet (500 mg total) by mouth 2 (two) times daily. 10/20/21  Yes Pollyann Savoy, MD  carvedilol (COREG) 25 MG tablet TAKE ONE TABLET BY MOUTH TWICE A DAY WITH A MEAL 05/19/21   Camnitz, Will Daphine Deutscher, MD  diazepam (VALIUM) 5 MG tablet TAKE ONE TABLET BY MOUTH EVERY 8 HOURS AS NEEDED FOR ANXIETY 09/09/21   Copland, Karleen Hampshire, MD  esomeprazole (NEXIUM) 40 MG capsule Take 1 capsule (40 mg total) by mouth daily at 12 noon. 06/18/21   Salena Saner, MD  famotidine (PEPCID) 20 MG tablet Take 1 tablet (20 mg total) by mouth daily. 04/06/20   Lynn Ito, MD  spironolactone (ALDACTONE) 25 MG tablet TAKE ONE TABLET BY MOUTH DAILY 01/01/21   Copland, Karleen Hampshire, MD     Allergies    Codeine, Sulfonamide derivatives, Paroxetine, Lisinopril, Losartan potassium, Neomycin-polymyxin-dexameth, Oxycodone-acetaminophen, and Sulfa antibiotics   Review of Systems   Review of Systems Please see HPI for pertinent positives and negatives  Physical Exam BP (!) 165/103    Pulse 71    Temp 97.8 F (36.6 C) (Oral)    Resp (!) 21    SpO2 96%    Physical Exam Vitals and nursing note reviewed.  Constitutional:      Appearance: Normal appearance.  HENT:     Head: Normocephalic and atraumatic.     Nose: Nose normal.     Mouth/Throat:     Mouth: Mucous membranes are moist.  Eyes:     Extraocular Movements: Extraocular movements intact.     Conjunctiva/sclera: Conjunctivae normal.  Cardiovascular:     Rate and Rhythm: Normal rate.  Pulmonary:     Effort: Pulmonary effort is normal.     Breath sounds: Normal breath sounds.  Chest:     Chest wall: Tenderness (lower sternum and L sternal border) present.  Abdominal:     General: Abdomen is flat.     Palpations: Abdomen is soft.     Tenderness: There is no abdominal tenderness.  Musculoskeletal:        General: No swelling. Normal range of motion.     Cervical back: Neck supple.  Skin:    General: Skin is warm and dry.  Neurological:     General: No focal deficit present.     Mental Status: She is alert.  Psychiatric:        Mood and Affect: Mood normal.    ED Results / Procedures / Treatments   EKG EKG Interpretation  Date/Time:  Monday October 19 2021 19:23:35 EST Ventricular Rate:  80 PR Interval:  194 QRS Duration:  160 QT Interval:  414 QTC Calculation: 477 R Axis:   -75 Text Interpretation: Atrial-sensed ventricular-paced rhythm Abnormal ECG No significant change since last tracing Confirmed by Susy Frizzle [phone]) on 10/20/2021 8:51:26 AM  Procedures Procedures  Medications Ordered in the ED Medications - No data to display  Initial Impression and Plan  Patient with reproducible chest wall pain, here for several hours awaiting ED exam room has had workup completed in triage. She had normal CBC, BMP and Trop x 2. EKG is paced and unchanged from prior. Her CXR was read as new reticular opacity in RUL compared to Sep 2022, however on my comparison to Nov 2022 it has not changed. This was confirmed with radiologist working this morning. She is not  having any symptoms of respiratory infection and just completed a course of doxycycline for sinusitis yesterday. Her symptoms and exam are most consistent with MSK chest wall pain. Plan Rx for NSAIDs. Close outpatient PCP follow up. RTED for any other concerns.   ED Course       MDM Rules/Calculators/A&P Medical Decision Making Problems Addressed: Chest wall pain: acute illness or injury that poses a threat to life or bodily functions  Amount and/or Complexity of Data Reviewed External Data Reviewed: radiology. Labs: ordered. Decision-making details documented in ED Course. Radiology: ordered and independent interpretation performed. Decision-making details documented in ED Course. ECG/medicine tests: ordered and independent interpretation performed. Decision-making details documented in ED Course.  Risk Prescription drug management. Decision regarding hospitalization.    Final Clinical Impression(s) / ED Diagnoses Final diagnoses:  Chest wall pain    Rx / DC Orders ED Discharge Orders          Ordered    naproxen (NAPROSYN) 500 MG tablet  2 times daily        10/20/21 0903             Pollyann Savoy, MD 10/20/21 [phone]

## 2021-10-20 NOTE — ED Notes (Signed)
Pt verbalized understanding of d/c instructions, meds and followup care. Denies questions. VSS, no distress noted. Steady gait to exit with all belongings.  ?

## 2021-10-26 ENCOUNTER — Encounter: Payer: BC Managed Care – PPO | Admitting: Family Medicine

## 2021-11-10 ENCOUNTER — Other Ambulatory Visit: Payer: Self-pay

## 2021-11-10 ENCOUNTER — Encounter: Payer: Self-pay | Admitting: Pulmonary Disease

## 2021-11-10 ENCOUNTER — Ambulatory Visit: Payer: BC Managed Care – PPO | Admitting: Pulmonary Disease

## 2021-11-10 VITALS — BP 130/80 | HR 77 | Temp 97.3°F | Ht 63.0 in | Wt 209.6 lb

## 2021-11-10 DIAGNOSIS — G4733 Obstructive sleep apnea (adult) (pediatric): Secondary | ICD-10-CM

## 2021-11-10 DIAGNOSIS — J849 Interstitial pulmonary disease, unspecified: Secondary | ICD-10-CM | POA: Diagnosis not present

## 2021-11-10 DIAGNOSIS — I442 Atrioventricular block, complete: Secondary | ICD-10-CM

## 2021-11-10 IMAGING — DX DG CHEST 1V
1 series · 1 of 1 positions shown · non-contrast
Comparison: None.

CLINICAL DATA: Concern for infection

EXAM:
CHEST  1 VIEW

[chest ap]
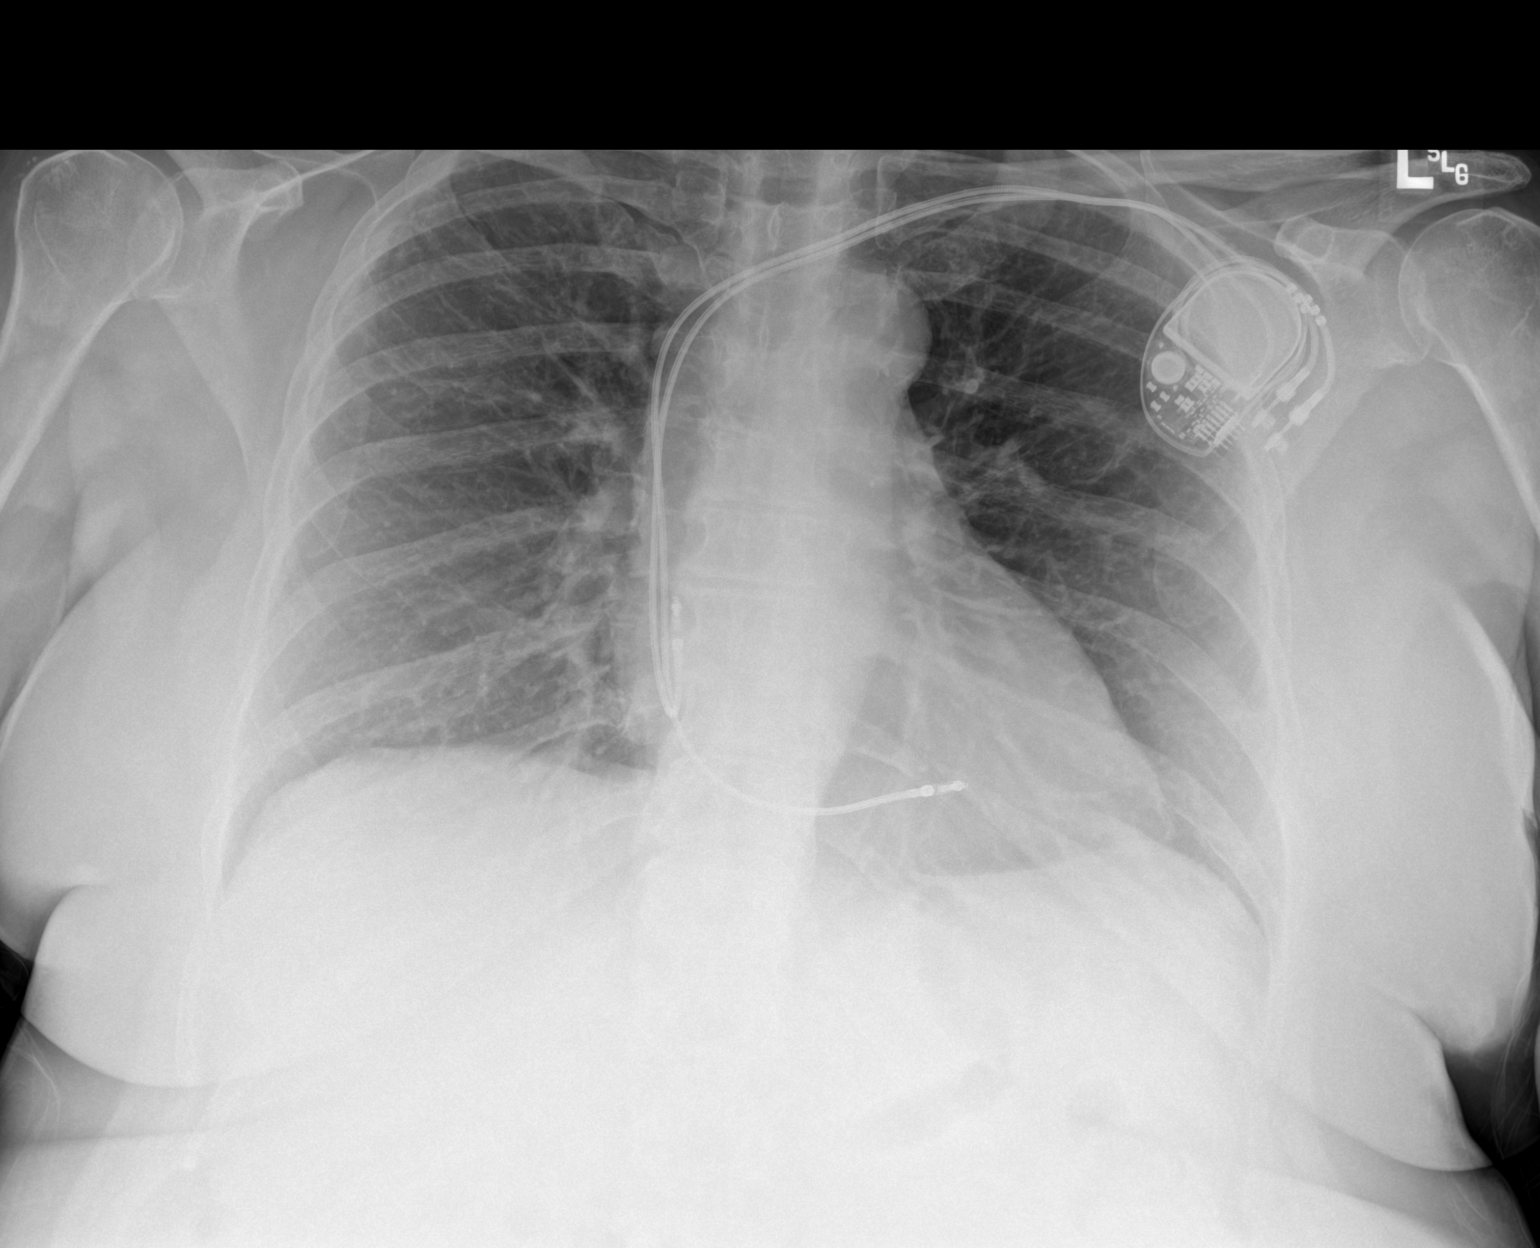

[1 of 1 positions shown; findings below may reference images not displayed]

FINDINGS: Left chest wall dual lead pacemaker with unchanged lead position.
The heart size and mediastinal contours are within normal limits.
Unchanged fibrotic changes of the upper lungs. Lungs are otherwise
clear. The visualized skeletal structures are unremarkable.
IMPRESSION: No evidence of pneumonia

## 2021-11-10 NOTE — Patient Instructions (Signed)
We are going to get a CT that will tell us how you are lung scarring is doing.   We also plan to get overnight oximetry that is on oxygen evaluation at nighttime to see how low your oxygen is going.   Please reconsider treatment for your very severe sleep apnea as this can affect your heart.   We will see you in follow-up in 4 months time call sooner should any new problems arise.

## 2021-11-10 NOTE — Progress Notes (Signed)
Subjective:    Patient ID: Anne Rush, female    DOB: April 01, 1961, 61 y.o.   MRN: [id number] Patient Care Team: Owens Loffler, MD as PCP - General (Family Medicine) Constance Haw, MD as PCP - Electrophysiology (Cardiology) O'Neal, Cassie Freer, MD as PCP - Cardiology (Cardiology)  Chief Complaint  Patient presents with   Follow-up    HPI Patient is a 61 year old former smoker quit 2012, 15-pack-year history) who presents for follow-up of interstitial lung disease and fatigue.  This is a scheduled visit.  She was last seen here on 10 August 2021.  She did have a sleep study finally on 29 September 2021 that showed that she has SEVERE sleep apnea in addition she has hypoxemia outside of obstructive episodes which may indicate sleep-related hypoxia/hypoventilation.  It is recommended that she undergo a formal titration study however she is refusing this.  She states that she does not want to wear a CPAP mask because it can "mess up her hair".  I am not certain that she will qualify for an inspire device given that she has a pacemaker in place also she is greater than 32 (37.8 currently) however, she would like to undergo formal evaluation by ENT.  I have advised her that untreated sleep apnea will be exceedingly detrimental to her.  She continues to have some issues with shortness of breath.  She had underlying RB ILD and then 2 episodes of COVID 1 in August and then relapse in October 2022 this may have left her with significant postinflammatory fibrosis as well however, this has not been evident on plain films.  Will need assessment with high resolution CT chest.  She does not endorse any new symptomatology.  Mostly fatigue and some shortness of breath on exertion.  Fevers, chills or sweats.  Very poor sleep quality with nonrestorative sleep.   Review of Systems A 10 point review of systems was performed and it is as noted above otherwise negative.  Patient Active Problem List    Diagnosis Date Noted   Acute non-recurrent frontal sinusitis 10/06/2021   GERD (gastroesophageal reflux disease) [id number]   Chronic systolic CHF (congestive heart failure) ([place]) 07/15/2020   Interstitial lung disease ([place]) [id number]   Diastolic dysfunction [id number]   Pacemaker 10/01/2018   Vitamin D deficiency 01/11/2018   Allergic angioedema 03/17/2016   Complete heart block ([place]) s/p Pacemaker 02/26/2016   Former tobacco use 08/28/2008   Hyperlipidemia LDL goal <100 06/21/2007   GAD (generalized anxiety disorder) 06/05/2007   Essential hypertension 06/05/2007   ALLERGIC RHINITIS 06/05/2007   Social History   Tobacco Use   Smoking status: Former    Packs/day: 0.50    Years: 30.00    Pack years: 15.00    Types: Cigarettes    Quit date: 2012    Years since quitting: 11.1   Smokeless tobacco: Never  Substance Use Topics   Alcohol use: No   Allergies  Allergen Reactions   Codeine Hives and Itching   Sulfonamide Derivatives Hives   Paroxetine Other (See Comments)    unknown   Lisinopril Rash   Losartan Potassium Other (See Comments)    Leg felt heavy   Neomycin-Polymyxin-Dexameth Itching, Swelling and Other (See Comments)    Ey drop/Swelling and itching of eye    Oxycodone-Acetaminophen Other (See Comments)    Mild SOB, feels funny   Sulfa Antibiotics Hives and Rash        Immunization History  Administered Date(s) Administered   Influenza  Nasal 06/20/2017   Influenza Whole 07/04/2008, 06/18/2009, 08/27/2010   Influenza, Seasonal, Injecte, Preservative Fre 09/03/2010, 06/22/2011, 06/27/2012   Influenza,inj,Quad PF,6+ Mos 07/21/2015, 07/06/2018, 08/10/2021   Influenza-Unspecified 06/27/2012, 07/17/2014, 07/19/2016, 07/21/2019   Td 05/10/2002   Tdap 10/28/2014      Objective:   Physical Exam BP 130/80 (BP Location: Left Arm, Patient Position: Sitting, Cuff Size: Normal)    Pulse 77    Temp (!) 97.3 F (36.3 C) (Oral)    Ht 5\' 3"  (1.6 m)    Wt 209 lb 9.6  oz (95.1 kg)    SpO2 98%    BMI 37.13 kg/m  GENERAL: Obese woman, no acute distress.  Appears fatigued.  Fully ambulatory.  No conversational dyspnea.  HEAD: Normocephalic, atraumatic.  EYES: Pupils equal, round, reactive to light.  No scleral icterus.  Mild periorbital edema/puffiness. MOUTH: Nose/mouth/throat not examined due to masking requirements for COVID 19. NECK: Supple. No thyromegaly. Trachea midline. No JVD.  No adenopathy. PULMONARY: Good air entry bilaterally.  No adventitious sounds.  Overt crackles. CARDIOVASCULAR: S1 and S2. Regular rate and rhythm.  Pacer dependent.  No murmurs. ABDOMEN: Obese otherwise benign. MUSCULOSKELETAL: No joint deformity,+ clubbing (mild), no edema.  NEUROLOGIC: No focal deficit, no gait disturbance, speech is fluent. SKIN: Intact,warm,dry. PSYCH: Mood and behavior normal.     Assessment & Plan:     ICD-10-CM   1. OSA (obstructive sleep apnea) - severe AHI 45.3  G47.33 Ambulatory referral to ENT   AHI 45.3 and SPO2 low of 68% Oxygen desats even without obstructive events Sleep-related hypoxia/hypoventilation suspected Wants Inspire device    2. Interstitial lung disease (HCC)  J84.9 CT Chest High Resolution    Pulse oximetry, overnight   No progression as of last study Repeat high-res CT Query RB ILD    3. Complete heart block ([facility])  I44.2    Status post pacemaker placement     Orders Placed This Encounter  Procedures   CT Chest High Resolution    Standing Status:   Future    Number of Occurrences:   1    Standing Expiration Date:   11/10/2022    Order Specific Question:   Is patient pregnant?    Answer:   No    Order Specific Question:   Preferred imaging location?    Answer:   [facility]   Ambulatory referral to ENT    Referral Priority:   Routine    Referral Type:   Consultation    Referral Reason:   Specialty Services Required    Referred to Provider:   Melida Quitter, MD    Requested Specialty:   Otolaryngology     Number of Visits Requested:   1   Pulse oximetry, overnight    ON RA    Standing Status:   Future    Standing Expiration Date:   11/10/2022   Immunization History  Administered Date(s) Administered   Influenza Nasal 06/20/2017   Influenza Whole 07/04/2008, 06/18/2009, 08/27/2010   Influenza, Seasonal, Injecte, Preservative Fre 09/03/2010, 06/22/2011, 06/27/2012   Influenza,inj,Quad PF,6+ Mos 07/21/2015, 07/06/2018, 08/10/2021   Influenza-Unspecified 06/27/2012, 07/17/2014, 07/19/2016, 07/21/2019   Td 05/10/2002   Tdap 10/28/2014   The patient has severe obstructive sleep apnea.  I suspect that her issues with fatigue are mostly related to this issue.  He is however reluctant to undergo titration study to determine optimal CPAP/BiPAP therapy.  She is concerned that a mask with "mess of her hair".  She may also have  other concerns with regards to mask wearing that she is not being very forthcoming about.  We will reassess her ILD with high resolution CT of the chest.  We are referring her to ENT for evaluation of an inspire device however, I am not certain that she will qualify for this given her BMI and also concurrent use of pacemaker (though, per the inspire website, this does not seem to be an absolute contraindication).  We will obtain an overnight oximetry given that she does have some episodes of desaturations not related to obstructive events.  We will see her in follow-up in 4 months time she is to call sooner should any new difficulties arise.   Renold Don, MD Advanced Bronchoscopy PCCM [facility]    *This note was dictated using voice recognition software/Dragon.  Despite best efforts to proofread, errors can occur which can change the meaning. Any transcriptional errors that result from this process are unintentional and may not be fully corrected at the time of dictation.

## 2021-11-11 ENCOUNTER — Encounter: Payer: Self-pay | Admitting: Family Medicine

## 2021-11-17 ENCOUNTER — Other Ambulatory Visit (INDEPENDENT_AMBULATORY_CARE_PROVIDER_SITE_OTHER): Payer: BC Managed Care – PPO

## 2021-11-17 ENCOUNTER — Other Ambulatory Visit: Payer: Self-pay

## 2021-11-17 DIAGNOSIS — Z131 Encounter for screening for diabetes mellitus: Secondary | ICD-10-CM

## 2021-11-17 DIAGNOSIS — N200 Calculus of kidney: Secondary | ICD-10-CM | POA: Diagnosis not present

## 2021-11-17 DIAGNOSIS — E785 Hyperlipidemia, unspecified: Secondary | ICD-10-CM

## 2021-11-17 DIAGNOSIS — R5383 Other fatigue: Secondary | ICD-10-CM

## 2021-11-17 DIAGNOSIS — Z79899 Other long term (current) drug therapy: Secondary | ICD-10-CM | POA: Diagnosis not present

## 2021-11-17 DIAGNOSIS — E559 Vitamin D deficiency, unspecified: Secondary | ICD-10-CM

## 2021-11-17 LAB — URINALYSIS, MICROSCOPIC ONLY

## 2021-11-17 LAB — URINALYSIS, ROUTINE W REFLEX MICROSCOPIC
Bilirubin Urine: NEGATIVE
Hgb urine dipstick: NEGATIVE
Ketones, ur: NEGATIVE
Leukocytes,Ua: NEGATIVE
Nitrite: NEGATIVE
Specific Gravity, Urine: 1.02 (ref 1.000–1.030)
Total Protein, Urine: NEGATIVE
Urine Glucose: NEGATIVE
Urobilinogen, UA: 0.2 (ref 0.0–1.0)
pH: 6 (ref 5.0–8.0)

## 2021-11-17 LAB — HEMOGLOBIN A1C: Hgb A1c MFr Bld: 5.7 % (ref 4.6–6.5)

## 2021-11-17 LAB — BASIC METABOLIC PANEL
BUN: 14 mg/dL (ref 6–23)
CO2: 30 mEq/L (ref 19–32)
Calcium: 9.3 mg/dL (ref 8.4–10.5)
Chloride: 103 mEq/L (ref 96–112)
Creatinine, Ser: 0.64 mg/dL (ref 0.40–1.20)
GFR: 97.06 mL/min (ref >60.00)
Glucose, Bld: 96 mg/dL (ref 70–99)
Potassium: 4.2 mEq/L (ref 3.5–5.1)
Sodium: 140 mEq/L (ref 135–145)

## 2021-11-17 LAB — LIPID PANEL
Cholesterol: 199 mg/dL (ref 0–200)
HDL: 45.5 mg/dL (ref >39.00)
LDL Cholesterol: 116 mg/dL — ABNORMAL HIGH (ref 0–99)
NonHDL: 153.44
Total CHOL/HDL Ratio: 4
Triglycerides: 187 mg/dL — ABNORMAL HIGH (ref 0.0–149.0)
VLDL: 37.4 mg/dL (ref 0.0–40.0)

## 2021-11-17 LAB — HEPATIC FUNCTION PANEL
ALT: 13 U/L (ref 0–35)
AST: 13 U/L (ref 0–37)
Albumin: 4.4 g/dL (ref 3.5–5.2)
Alkaline Phosphatase: 53 U/L (ref 39–117)
Bilirubin, Direct: 0.1 mg/dL (ref 0.0–0.3)
Total Bilirubin: 0.6 mg/dL (ref 0.2–1.2)
Total Protein: 6.9 g/dL (ref 6.0–8.3)

## 2021-11-17 LAB — TSH: TSH: 2.38 u[IU]/mL (ref 0.35–5.50)

## 2021-11-17 LAB — VITAMIN D 25 HYDROXY (VIT D DEFICIENCY, FRACTURES): VITD: 31.84 ng/mL (ref 30.00–100.00)

## 2021-11-19 ENCOUNTER — Other Ambulatory Visit: Payer: Self-pay

## 2021-11-19 ENCOUNTER — Ambulatory Visit
Admission: RE | Admit: 2021-11-19 | Discharge: 2021-11-19 | Disposition: A | Discharge status: Home / Self Care | Payer: BC Managed Care – PPO | Source: Ambulatory Visit | Attending: Pulmonary Disease | Admitting: Pulmonary Disease

## 2021-11-19 DIAGNOSIS — J84112 Idiopathic pulmonary fibrosis: Secondary | ICD-10-CM | POA: Diagnosis not present

## 2021-11-19 DIAGNOSIS — I700 Atherosclerosis of aorta: Secondary | ICD-10-CM | POA: Diagnosis not present

## 2021-11-19 DIAGNOSIS — J432 Centrilobular emphysema: Secondary | ICD-10-CM | POA: Diagnosis not present

## 2021-11-19 DIAGNOSIS — R918 Other nonspecific abnormal finding of lung field: Secondary | ICD-10-CM | POA: Diagnosis not present

## 2021-11-19 DIAGNOSIS — J849 Interstitial pulmonary disease, unspecified: Secondary | ICD-10-CM | POA: Diagnosis not present

## 2021-11-24 DIAGNOSIS — G4730 Sleep apnea, unspecified: Secondary | ICD-10-CM | POA: Diagnosis not present

## 2021-11-24 DIAGNOSIS — R0683 Snoring: Secondary | ICD-10-CM | POA: Diagnosis not present

## 2021-11-25 ENCOUNTER — Other Ambulatory Visit: Payer: Self-pay

## 2021-11-25 ENCOUNTER — Encounter: Payer: Self-pay | Admitting: Family Medicine

## 2021-11-25 ENCOUNTER — Ambulatory Visit (INDEPENDENT_AMBULATORY_CARE_PROVIDER_SITE_OTHER): Payer: BC Managed Care – PPO | Admitting: Family Medicine

## 2021-11-25 VITALS — BP 130/80 | HR 79 | Temp 98.6°F | Ht 62.5 in | Wt 210.3 lb

## 2021-11-25 DIAGNOSIS — Z0000 Encounter for general adult medical examination without abnormal findings: Secondary | ICD-10-CM

## 2021-11-25 NOTE — Progress Notes (Signed)
Anne Rush T. Pascal Stiggers, MD, CAQ Sports Medicine [facility] at [facility] [address] [place], [address]  Phone: [phone]   FAX: [phone]  Anne Rush - 61 y.o. female   MRN [id number]   Date of Birth: 1961-02-12  Date: 11/25/2021   PCP: Anne Beat, MD   Referral: Anne Beat, MD  Chief Complaint  Patient presents with   Annual Exam    This visit occurred during the SARS-CoV-2 public health emergency.  Safety protocols were in place, including screening questions prior to the visit, additional usage of staff PPE, and extensive cleaning of exam room while observing appropriate contact time as indicated for disinfecting solutions.   Patient Care Team: Anne Beat, MD as PCP - General (Family Medicine) Regan Lemming, MD as PCP - Electrophysiology (Cardiology) O'Neal, Ronnald Ramp, MD as PCP - Cardiology (Cardiology) Subjective:   Anne Rush is a 61 y.o. pleasant patient who presents with the following:  Health Maintenance Summary Reviewed and updated, unless pt declines services.  Tobacco History Reviewed. Non-smoker, former Alcohol: No concerns, no excessive use Exercise Habits: Some activity, rec at least 30 mins 5 times a week STD concerns: none Drug Use: None Lumps or breast concerns: no  In the past, she has declined a number of different vaccines including COVID-19.  Shingrix.  Significant long-term medical problems include interstitial pulmonary disease as well as some congestive heart failure, pacemaker, complete heart block.  She does have good follow-up and is very compliant with pulmonary as well as cardiology.  COVID Shingrix, Mammogram - will not get one.   She does have some chest pain. Will have some chest pain roughly once a week. She took some alleve, and   Thinking feels off, ? Post-covid.  ? Post-sepsis ? Post-covid  Having a lot of stress Tries not to take valium, but she does take them  some. -  Anxiety seems to have gotten worse.  Feels overwhelmed some.  - has been averse to taking a SSRI  She does have severe OSA, will not wear a cpap. 09/2021 Sleep test.    Health Maintenance  Topic Date Due   Hepatitis C Screening  Never done   Zoster Vaccines- Shingrix (1 of 2) Never done   MAMMOGRAM  04/28/2016   PAP SMEAR-Modifier  09/25/2021   COVID-19 Vaccine (1) 11/26/2021 (Originally 06/12/1961)   Fecal DNA (Cologuard)  11/04/2023   TETANUS/TDAP  10/28/2024   INFLUENZA VACCINE  Completed   HIV Screening  Completed   HPV VACCINES  Aged Out    Immunization History  Administered Date(s) Administered   Influenza Nasal 06/20/2017   Influenza Whole 07/04/2008, 06/18/2009, 08/27/2010   Influenza, Seasonal, Injecte, Preservative Fre 09/03/2010, 06/22/2011, 06/27/2012   Influenza,inj,Quad PF,6+ Mos 07/21/2015, 07/06/2018, 08/10/2021   Influenza-Unspecified 06/27/2012, 07/17/2014, 07/19/2016, 07/21/2019   Td 05/10/2002   Tdap 10/28/2014   Patient Active Problem List   Diagnosis Date Noted   Chronic systolic CHF (congestive heart failure) (HCC) 07/15/2020    Priority: High   Interstitial lung disease (HCC) 04/18/2020    Priority: High   Pacemaker 10/01/2018    Priority: High   Complete heart block (HCC) s/p Pacemaker 02/26/2016    Priority: High   Hyperlipidemia LDL goal <100 06/21/2007    Priority: Medium    GAD (generalized anxiety disorder) 06/05/2007    Priority: Medium    Essential hypertension 06/05/2007    Priority: Medium    Acute non-recurrent frontal sinusitis 10/06/2021  GERD (gastroesophageal reflux disease) 07/15/2020   Diastolic dysfunction 04/02/2020   Vitamin D deficiency 01/11/2018   Allergic angioedema 03/17/2016   Former tobacco use 08/28/2008   ALLERGIC RHINITIS 06/05/2007    Past Medical History:  Diagnosis Date   Chronic systolic CHF (congestive heart failure) (HCC) 07/15/2020   Complete heart block (HCC) 02/26/2016   s/p Pacemaker    Family history of ovarian cancer    Generalized anxiety disorder 07/15/2020   GERD (gastroesophageal reflux disease)    History of kidney stones    Hyperlipidemia    Hypertension    Interstitial lung disease (HCC)    Pollen allergies    Presence of permanent cardiac pacemaker    Pulmonary fibrosis (HCC)    Squamous cell carcinoma 2014   skin cancer   Subclinical hyperthyroidism 06/17/2014    Past Surgical History:  Procedure Laterality Date   CESAREAN SECTION     COSMETIC SURGERY     skin cancer face   CYSTOSCOPY W/ URETERAL STENT PLACEMENT Left 07/15/2020   Procedure: CYSTOSCOPY WITH RETROGRADE PYELOGRAM/URETERAL STENT PLACEMENT;  Surgeon: Riki Altes, MD;  Location: [facility] ORS;  Service: Urology;  Laterality: Left;   CYSTOSCOPY/URETEROSCOPY/HOLMIUM LASER/STENT PLACEMENT Left 07/28/2020   Procedure: CYSTOSCOPY/URETEROSCOPY/HOLMIUM LASER/STENT Exchange;  Surgeon: Vanna Scotland, MD;  Location: [facility];  Service: Urology;  Laterality: Left;   EP IMPLANTABLE DEVICE N/A 02/26/2016   Procedure: Pacemaker Implant;  Surgeon: Will Jorja Loa, MD;  Location: [facility];  Service: Cardiovascular;  Laterality: N/A;   TUBAL LIGATION      Family History  Problem Relation Age of Onset   Anxiety disorder Mother    Hypertension Mother    Ovarian cancer Mother 10   Cancer Maternal Aunt        breast   Heart failure Maternal Grandfather     Past Medical History, Surgical History, Social History, Family History, Problem List, Medications, and Allergies have been reviewed and updated if relevant.  Review of Systems: Pertinent positives are listed above.  Otherwise, a full 14 point review of systems has been done in full and it is negative except where it is noted positive.  Objective:   BP 130/80    Pulse 79    Temp 98.6 F (37 C) (Oral)    Ht 5' 2.5" (1.588 m)    Wt 210 lb 5 oz (95.4 kg)    SpO2 95%    BMI 37.85 kg/m  Ideal Body Weight: Weight in (lb) to have BMI = 25:  138.6 No results found. Depression screen [facility] 2/9 11/25/2021 10/16/2020 04/16/2019 04/12/2018 05/17/2017  Decreased Interest 0 0 0 0 0  Down, Depressed, Hopeless 0 0 0 0 0  PHQ - 2 Score 0 0 0 0 0  Some recent data might be hidden     GEN: well developed, well nourished, no acute distress Eyes: conjunctiva and lids normal, PERRLA, EOMI ENT: TM clear, nares clear, oral exam WNL Neck: supple, no lymphadenopathy, no thyromegaly, no JVD Pulm: clear to auscultation and percussion, respiratory effort normal CV: regular rate and rhythm, S1-S2, no murmur, rub or gallop, no bruits Chest: no scars, masses, no lumps BREAST: breast exam declined GI: soft, non-tender; no hepatosplenomegaly, masses; active bowel sounds all quadrants GU: GU exam declined Lymph: no cervical, axillary or inguinal adenopathy MSK: gait normal, muscle tone and strength WNL, no joint swelling, effusions, discoloration, crepitus  SKIN: clear, good turgor, color WNL, no rashes, lesions, or ulcerations Neuro: normal mental status, normal strength,  sensation, and motion Psych: alert; oriented to person, place and time, normally interactive and not anxious or depressed in appearance.   All labs reviewed with patient. Results for orders placed or performed in visit on 11/17/21  Lipid panel  Result Value Ref Range   Cholesterol 199 0 - 200 mg/dL   Triglycerides 010.2 (H) 0.0 - 149.0 mg/dL   HDL 72.53 >66.44 mg/dL   VLDL 03.4 0.0 - 74.2 mg/dL   LDL Cholesterol 595 (H) 0 - 99 mg/dL   Total CHOL/HDL Ratio 4    NonHDL 153.44   Hemoglobin A1c  Result Value Ref Range   Hgb A1c MFr Bld 5.7 4.6 - 6.5 %  Basic metabolic panel  Result Value Ref Range   Sodium 140 135 - 145 mEq/L   Potassium 4.2 3.5 - 5.1 mEq/L   Chloride 103 96 - 112 mEq/L   CO2 30 19 - 32 mEq/L   Glucose, Bld 96 70 - 99 mg/dL   BUN 14 6 - 23 mg/dL   Creatinine, Ser 6.38 0.40 - 1.20 mg/dL   GFR 75.64 >33.29 mL/min   Calcium 9.3 8.4 - 10.5 mg/dL  Hepatic  function panel  Result Value Ref Range   Total Bilirubin 0.6 0.2 - 1.2 mg/dL   Bilirubin, Direct 0.1 0.0 - 0.3 mg/dL   Alkaline Phosphatase 53 39 - 117 U/L   AST 13 0 - 37 U/L   ALT 13 0 - 35 U/L   Total Protein 6.9 6.0 - 8.3 g/dL   Albumin 4.4 3.5 - 5.2 g/dL  VITAMIN D 25 Hydroxy (Vit-D Deficiency, Fractures)  Result Value Ref Range   VITD 31.84 30.00 - 100.00 ng/mL  TSH  Result Value Ref Range   TSH 2.38 0.35 - 5.50 uIU/mL  Urinalysis, Routine w reflex microscopic  Result Value Ref Range   Color, Urine YELLOW Yellow;Lt. Yellow;Straw;Dark Yellow;Amber;Green;Red;Brown   APPearance CLEAR Clear;Turbid;Slightly Cloudy;Cloudy   Specific Gravity, Urine 1.020 1.000 - 1.030   pH 6.0 5.0 - 8.0   Total Protein, Urine NEGATIVE Negative   Urine Glucose NEGATIVE Negative   Ketones, ur NEGATIVE Negative   Bilirubin Urine NEGATIVE Negative   Hgb urine dipstick NEGATIVE Negative   Urobilinogen, UA 0.2 0.0 - 1.0   Leukocytes,Ua NEGATIVE Negative   Nitrite NEGATIVE Negative   WBC, UA 0-2/hpf 0-2/hpf   RBC / HPF 0-2/hpf 0-2/hpf   Squamous Epithelial / LPF Rare(0-4/hpf) Rare(0-4/hpf)  Urine Microscopic  Result Value Ref Range   WBC, UA 0-2/hpf 0-2/hpf   RBC / HPF 0-2/hpf 0-2/hpf   Squamous Epithelial / LPF Rare(0-4/hpf) Rare(0-4/hpf)   CT Chest High Resolution  Result Date: 11/20/2021 CLINICAL DATA:  Interstitial lung disease EXAM: CT CHEST WITHOUT CONTRAST TECHNIQUE: Multidetector CT imaging of the chest was performed following the standard protocol without intravenous contrast. High resolution imaging of the lungs, as well as inspiratory and expiratory imaging, was performed. RADIATION DOSE REDUCTION: This exam was performed according to the departmental dose-optimization program which includes automated exposure control, adjustment of the mA and/or kV according to patient size and/or use of iterative reconstruction technique. COMPARISON:  Chest CT dated September 01, 2020 FINDINGS:  Cardiovascular: Normal heart size. No pericardial effusion. Left chest wall cardiac conduction device with leads in the right atrium and right ventricle. Mild calcified plaque of the thoracic aorta. Mediastinum/Nodes: Esophagus and thyroid are unremarkable. No pathologically enlarged lymph nodes seen in the chest. Lungs/Pleura: Central airways are patent. No significant air trapping. Upper lung predominant centrilobular emphysema and  cystic change with associated interstitial and mild ground-glass opacities with both a peribronchovascular and subpleural distribution. Associated ground-glass appears slightly decreased when compared with prior exam. No pleural effusion or pneumothorax. Upper Abdomen: No acute abnormality. Musculoskeletal: No chest wall mass or suspicious bone lesions identified. IMPRESSION: 1. Upper lobe predominant fibrotic interstitial lung disease, associated ground-glass appears slightly decreased when compared with most recent prior exam dated September 01, 2020. Findings are likely due to smoking related interstitial lung disease. Fibrotic HP an additional consideration, although this is less likely given no evidence of air trapping. Findings are suggestive of an alternative diagnosis (not UIP) per consensus guidelines: Diagnosis of Idiopathic Pulmonary Fibrosis: An Official ATS/ERS/JRS/ALAT Clinical Practice Guideline. Am Rosezetta Schlatter Crit Care Med Vol 198, Iss 5, [phone], May 21 2017. 2. Aortic Atherosclerosis (ICD10-I70.0) and Emphysema (ICD10-J43.9). Electronically Signed   By: Allegra Lai M.D.   On: 11/20/2021 10:22    Assessment and Plan:     ICD-10-CM   1. Healthcare maintenance  Z00.00      I encouraged her regarding her blood work.  Unfortunately, she continues to decline multiple vaccines including COVID-19, Shingrix, as well as all pneumonia vaccines.  Particularly troubling in a patient with interstitial lung disease and congestive heart failure.  She declines obtaining  a mammogram.  She also declines wearing CPAP while having severe sleep apnea.  She is thinking about possibly wearing oxygen at night.  We reviewed all of these things and the implications for potential mortality.  She understands.  I think that in light of these things the main thing that she could probably do to help herself would be to walks on at least 5 to 10 minutes a day.  I suspect that this will also help with her anxiety.  She knows also to keep working on her weight.  Health Maintenance Exam: The patient's preventative maintenance and recommended screening tests for an annual wellness exam were reviewed in full today. Brought up to date unless services declined.  Counselled on the importance of diet, exercise, and its role in overall health and mortality. The patient's FH and SH was reviewed, including their home life, tobacco status, and drug and alcohol status.  Follow-up in 1 year for physical exam or additional follow-up below.  Follow-up: No follow-ups on file. Or follow-up in 1 year if not noted.  Future Appointments  Date Time Provider Department Center  12/10/2021  7:45 AM [facility] DEVICE REMOTES [facility]  03/11/2022  7:45 AM [facility] DEVICE REMOTES [facility]  06/10/2022  7:45 AM [facility] DEVICE REMOTES [facility]  09/09/2022  7:45 AM [facility] DEVICE REMOTES [facility]  12/09/2022  7:45 AM [facility] DEVICE REMOTES [facility]  03/10/2023  7:45 AM [facility] DEVICE REMOTES [facility]  06/09/2023  7:45 AM [facility] DEVICE REMOTES [facility]    No orders of the defined types were placed in this encounter.  There are no discontinued medications. No orders of the defined types were placed in this encounter.   Signed,  Elpidio Galea. Leatta Alewine, MD   Allergies as of 11/25/2021       Reactions   Codeine Hives, Itching   Sulfonamide Derivatives Hives    Paroxetine Other (See Comments)   unknown   Lisinopril Rash   Losartan Potassium Other (See Comments)   Leg felt heavy   Neomycin-polymyxin-dexameth Itching, Swelling, Other (See Comments)   Ey drop/Swelling and itching of eye    Oxycodone-acetaminophen Other (See Comments)   Mild SOB, feels funny  Sulfa Antibiotics Hives, Rash           Medication List        Accurate as of November 25, 2021 10:15 AM. If you have any questions, ask your nurse or doctor.          carvedilol 25 MG tablet Commonly known as: COREG TAKE ONE TABLET BY MOUTH TWICE A DAY WITH A MEAL   diazepam 5 MG tablet Commonly known as: VALIUM TAKE ONE TABLET BY MOUTH EVERY 8 HOURS AS NEEDED FOR ANXIETY   esomeprazole 40 MG capsule Commonly known as: NEXIUM Take 1 capsule (40 mg total) by mouth daily at 12 noon.   famotidine 20 MG tablet Commonly known as: PEPCID Take 1 tablet (20 mg total) by mouth daily.   naproxen 500 MG tablet Commonly known as: NAPROSYN Take 1 tablet (500 mg total) by mouth 2 (two) times daily.   spironolactone 25 MG tablet Commonly known as: ALDACTONE TAKE ONE TABLET BY MOUTH DAILY

## 2021-11-25 NOTE — Patient Instructions (Signed)
1 tablet of alleve is ok ? ?2 tablets of ibuprofen is ok ?

## 2021-11-27 ENCOUNTER — Encounter: Payer: Self-pay | Admitting: Pulmonary Disease

## 2021-12-03 ENCOUNTER — Telehealth: Payer: Self-pay

## 2021-12-03 ENCOUNTER — Telehealth: Payer: Self-pay | Admitting: Pulmonary Disease

## 2021-12-03 NOTE — Telephone Encounter (Signed)
Called and spoke to patient per Dr Jayme Cloud pt qualifies for 2L of O2 at night time. Order placed nothing further. Pt is s6till not interested in CPAP.  ?

## 2021-12-03 NOTE — Telephone Encounter (Signed)
Spoke to patient, orders placed per Dr Jayme Cloud nothing further needed. ?

## 2021-12-07 DIAGNOSIS — I5189 Other ill-defined heart diseases: Secondary | ICD-10-CM | POA: Diagnosis not present

## 2021-12-07 DIAGNOSIS — J9601 Acute respiratory failure with hypoxia: Secondary | ICD-10-CM | POA: Diagnosis not present

## 2021-12-10 ENCOUNTER — Ambulatory Visit (INDEPENDENT_AMBULATORY_CARE_PROVIDER_SITE_OTHER): Payer: BC Managed Care – PPO

## 2021-12-10 DIAGNOSIS — I442 Atrioventricular block, complete: Secondary | ICD-10-CM

## 2021-12-11 LAB — CUP PACEART REMOTE DEVICE CHECK
Battery Remaining Longevity: 37 mo
Battery Remaining Percentage: 35 %
Battery Voltage: 2.93 V
Brady Statistic AP VP Percent: 1 %
Brady Statistic AP VS Percent: 1 %
Brady Statistic AS VP Percent: 99 %
Brady Statistic AS VS Percent: 1 %
Brady Statistic RA Percent Paced: 1 %
Brady Statistic RV Percent Paced: 99 %
Date Time Interrogation Session: 20230323024541
Implantable Lead Implant Date: 20170608
Implantable Lead Implant Date: 20170608
Implantable Lead Location: 753859
Implantable Lead Location: 753860
Implantable Pulse Generator Implant Date: 20170608
Lead Channel Impedance Value: 440 Ohm
Lead Channel Impedance Value: 450 Ohm
Lead Channel Pacing Threshold Amplitude: 0.75 V
Lead Channel Pacing Threshold Amplitude: 1 V
Lead Channel Pacing Threshold Pulse Width: 0.4 ms
Lead Channel Pacing Threshold Pulse Width: 0.4 ms
Lead Channel Sensing Intrinsic Amplitude: 12 mV
Lead Channel Sensing Intrinsic Amplitude: 4.2 mV
Lead Channel Setting Pacing Amplitude: 2 V
Lead Channel Setting Pacing Amplitude: 2.5 V
Lead Channel Setting Pacing Pulse Width: 0.4 ms
Lead Channel Setting Sensing Sensitivity: 2.5 mV
Pulse Gen Model: 2272
Pulse Gen Serial Number: 7903881

## 2021-12-14 ENCOUNTER — Other Ambulatory Visit: Payer: Self-pay

## 2021-12-14 ENCOUNTER — Ambulatory Visit (INDEPENDENT_AMBULATORY_CARE_PROVIDER_SITE_OTHER): Payer: BC Managed Care – PPO | Admitting: Adult Health

## 2021-12-14 ENCOUNTER — Encounter: Payer: Self-pay | Admitting: Adult Health

## 2021-12-14 DIAGNOSIS — J849 Interstitial pulmonary disease, unspecified: Secondary | ICD-10-CM

## 2021-12-14 DIAGNOSIS — G4734 Idiopathic sleep related nonobstructive alveolar hypoventilation: Secondary | ICD-10-CM | POA: Diagnosis not present

## 2021-12-14 DIAGNOSIS — G4733 Obstructive sleep apnea (adult) (pediatric): Secondary | ICD-10-CM | POA: Diagnosis not present

## 2021-12-14 DIAGNOSIS — I5022 Chronic systolic (congestive) heart failure: Secondary | ICD-10-CM | POA: Diagnosis not present

## 2021-12-14 DIAGNOSIS — J209 Acute bronchitis, unspecified: Secondary | ICD-10-CM | POA: Insufficient documentation

## 2021-12-14 MED ORDER — DOXYCYCLINE HYCLATE 100 MG PO TABS: 100.0000 mg | ORAL_TABLET | Freq: Two times a day (BID) | ORAL | 0 refills | Status: DC

## 2021-12-14 MED ORDER — ALBUTEROL SULFATE HFA 108 (90 BASE) MCG/ACT IN AERS: 1.0000 | INHALATION_SPRAY | Freq: Four times a day (QID) | RESPIRATORY_TRACT | 2 refills | Status: DC | PRN

## 2021-12-14 NOTE — Assessment & Plan Note (Signed)
Appears stable and euvolemic on exam.  Continue follow-up with cardiology ?

## 2021-12-14 NOTE — Assessment & Plan Note (Signed)
Acute bronchitis with underlying emphysema.  Patient is to begin antibiotics with empiric doxycycline ?Mucolytic's.' ?Albuterol inhaler as needed ? ?Plan  ?Patient Instructions  ?Doxycycline 100mg  Twice daily  for 7 days , take with food,  ?Use sunscreen  ?Saline nasal rinses As needed   ?Mucinex DM Twice daily  As needed  cough/congesiton  ?Fluids and rest  ?Oxygen 2l/m At bedtime  ?Albuterol inhaler 1-2 puffs every 6hrs as needed  ?Follow up with Dr. as planned and As needed   ?Please contact office for sooner follow up if symptoms do not improve or worsen or seek emergency care  ? ? ?  ? ?

## 2021-12-14 NOTE — Assessment & Plan Note (Signed)
Emphysema with upper lobe predominant interstitial lung disease-high-resolution CT chest was reviewed.  Groundglass opacities have decreased slightly from previous exam. ?Felt to be an alternative diagnosis not UIP. ?We reviewed her CT chest results in detail ?We will continue activity as tolerated oxygen at bedtime. ? ? ?Plan  ?Patient Instructions  ?Doxycycline 100mg  Twice daily  for 7 days , take with food,  ?Use sunscreen  ?Saline nasal rinses As needed   ?Mucinex DM Twice daily  As needed  cough/congesiton  ?Fluids and rest  ?Oxygen 2l/m At bedtime  ?Albuterol inhaler 1-2 puffs every 6hrs as needed  ?Follow up with Dr. as planned and As needed   ?Please contact office for sooner follow up if symptoms do not improve or worsen or seek emergency care  ? ? ?  ? ?

## 2021-12-14 NOTE — Progress Notes (Signed)
? ?@Patient  ID: Anne Rush, female    DOB: Aug 17, 1961, 61 y.o.   MRN: [id number] ? ?Chief Complaint  ?Patient presents with  ? Follow-up  ? ? ?Referring provider: ?Owens Loffler, MD ? ?HPI: ?61 year old female former smoker quit 2012 followed for RB-interstitial lung disease and obstructive sleep apnea ?Medical history significant for complete heart block status post pacemaker ?COVID-19 infection x2 ?Hx of Urosepsis 2021  ? ?TEST/EVENTS :  ?Sleep study January 2023 showed severe sleep apnea with hypoxemia ? ?12/14/2021 Follow up : RB-ILD and OSA  ?Patient returns for a 1 month follow-up.  Patient has underlying RB ILD.   ?Patient was set up for a high-resolution CT chest that was completed on November 19, 2021 that showed emphysema with cystic changes associated interstitial and mild groundglass opacities.  Decreased from previous exam findings are suggestive of alternative diagnosis not UIP, appear to be smoking-related interstitial lung disease. ?Complains over last 2 weeks of increased cough, congestion, drainage , thick green mucus and nasal discharge. Using saline nasal rinses .  ?Says she has had lingering symptoms since having Covid 19 infection x 2.  ?Has long haul Covid with fatigue, brain fog.  ? ?Patient also has underlying obstructive sleep apnea sleep study in January 2023 showed severe sleep apnea with hypoxemia.  Patient was recommended for a CPAP titration study.  Patient declined.  She wanted a referral to ENT for consideration of the inspire device. ?She changed her mind on ENT. She was started on Oxygen 2l/m At bedtime  .  ?She declines CPAP . Patient education given on OSA and potential complications . She says she absolutely will not wear CPAP .  ? ? ? ?Allergies  ?Allergen Reactions  ? Codeine Hives and Itching  ? Sulfonamide Derivatives Hives  ? Paroxetine Other (See Comments)  ?  unknown  ? Lisinopril Rash  ? Losartan Potassium Other (See Comments)  ?  Leg felt heavy  ?  Neomycin-Polymyxin-Dexameth Itching, Swelling and Other (See Comments)  ?  Ey drop/Swelling and itching of eye   ? Oxycodone-Acetaminophen Other (See Comments)  ?  Mild SOB, feels funny  ? Sulfa Antibiotics Hives and Rash  ?   ?  ? ? ?Immunization History  ?Administered Date(s) Administered  ? Influenza Nasal 06/20/2017  ? Influenza Whole 07/04/2008, 06/18/2009, 08/27/2010  ? Influenza, Seasonal, Injecte, Preservative Fre 09/03/2010, 06/22/2011, 06/27/2012  ? Influenza,inj,Quad PF,6+ Mos 07/21/2015, 07/06/2018, 08/10/2021  ? Influenza-Unspecified 06/27/2012, 07/17/2014, 07/19/2016, 07/21/2019  ? Td 05/10/2002  ? Tdap 10/28/2014  ? ? ?Past Medical History:  ?Diagnosis Date  ? Chronic systolic CHF (congestive heart failure) ([facility]) 07/15/2020  ? Complete heart block ([facility]) 02/26/2016  ? s/p Pacemaker  ? Family history of ovarian cancer   ? Generalized anxiety disorder 07/15/2020  ? GERD (gastroesophageal reflux disease)   ? History of kidney stones   ? Hyperlipidemia   ? Hypertension   ? Interstitial lung disease ([facility])   ? Pollen allergies   ? Presence of permanent cardiac pacemaker   ? Pulmonary fibrosis ([facility])   ? Squamous cell carcinoma 2014  ? skin cancer  ? Subclinical hyperthyroidism 06/17/2014  ? ? ?Tobacco History: ?Social History  ? ?Tobacco Use  ?Smoking Status Former  ? Packs/day: 0.50  ? Years: 30.00  ? Pack years: 15.00  ? Types: Cigarettes  ? Quit date: 2012  ? Years since quitting: 11.2  ?Smokeless Tobacco Never  ? ?Counseling given: Not Answered ? ? ?Outpatient Medications Prior to Visit  ?Medication  Sig Dispense Refill  ? acetaminophen (TYLENOL) 500 MG tablet Take 1,000 mg by mouth every 6 (six) hours as needed. As needed    ? carvedilol (COREG) 25 MG tablet TAKE ONE TABLET BY MOUTH TWICE A DAY WITH A MEAL 180 tablet 2  ? diazepam (VALIUM) 5 MG tablet TAKE ONE TABLET BY MOUTH EVERY 8 HOURS AS NEEDED FOR ANXIETY 30 tablet 3  ? esomeprazole (NEXIUM) 40 MG capsule Take 1 capsule (40 mg total) by mouth daily  at 12 noon. 30 capsule 2  ? famotidine (PEPCID) 20 MG tablet Take 1 tablet (20 mg total) by mouth daily. 30 tablet 0  ? spironolactone (ALDACTONE) 25 MG tablet TAKE ONE TABLET BY MOUTH DAILY 90 tablet 3  ? naproxen (NAPROSYN) 500 MG tablet Take 1 tablet (500 mg total) by mouth 2 (two) times daily. (Patient not taking: Reported on 12/14/2021) 30 tablet 0  ? ?Facility-Administered Medications Prior to Visit  ?Medication Dose Route Frequency Provider Last Rate Last Admin  ? albuterol (PROVENTIL) (2.5 MG/3ML) 0.083% nebulizer solution 2.5 mg  2.5 mg Nebulization Once Brand Males, MD      ? ? ? ?Review of Systems:  ? ?Constitutional:   No  weight loss, night sweats,  Fevers, chills,  ?+fatigue, or  lassitude. ? ?HEENT:   No headaches,  Difficulty swallowing,  Tooth/dental problems, or  Sore throat,  ?              No sneezing, itching, ear ache,  ?+nasal congestion, post nasal drip,  ? ?CV:  No chest pain,  Orthopnea, PND, swelling in lower extremities, anasarca, dizziness, palpitations, syncope.  ? ?GI  No heartburn, indigestion, abdominal pain, nausea, vomiting, diarrhea, change in bowel habits, loss of appetite, bloody stools.  ? ?Resp: .  No chest wall deformity ? ?Skin: no rash or lesions. ? ?GU: no dysuria, change in color of urine, no urgency or frequency.  No flank pain, no hematuria  ? ?MS:  No joint pain or swelling.  No decreased range of motion.  No back pain. ? ? ? ?Physical Exam ? ?BP 120/80 (BP Location: Left Arm, Patient Position: Sitting, Cuff Size: Large)   Pulse 94   Temp 97.9 ?F (36.6 ?C) (Oral)   Ht 5' 2.5" (1.588 m)   Wt 210 lb 12.8 oz (95.6 kg)   SpO2 96%   BMI 37.94 kg/m?  ? ?GEN: A/Ox3; pleasant , NAD, well nourished  ?  ?HEENT:  [place]/AT,   NOSE-clear, THROAT-clear, no lesions, no postnasal drip or exudate noted.  ? ?NECK:  Supple w/ fair ROM; no JVD; normal carotid impulses w/o bruits; no thyromegaly or nodules palpated; no lymphadenopathy.   ? ?RESP  Clear  P & A; w/o, wheezes/ rales/  or rhonchi. no accessory muscle use, no dullness to percussion ? ?CARD:  RRR, no m/r/g, no peripheral edema, pulses intact, no cyanosis or clubbing. ? ?GI:   Soft & nt; nml bowel sounds; no organomegaly or masses detected.  ? ?Musco: Warm bil, no deformities or joint swelling noted.  ? ?Neuro: alert, no focal deficits noted.   ? ?Skin: Warm, no lesions or rashes ? ? ? ?Lab Results: ? ?CBC ?   ?Component Value Date/Time  ? WBC 6.8 10/19/2021 1955  ? RBC 4.64 10/19/2021 1955  ? HGB 14.8 10/19/2021 1955  ? HCT 41.9 10/19/2021 1955  ? PLT 239 10/19/2021 1955  ? MCV 90.3 10/19/2021 1955  ? MCH 31.9 10/19/2021 1955  ? MCHC 35.3 10/19/2021  1955  ? RDW 11.9 10/19/2021 1955  ? LYMPHSABS 1.8 10/13/2020 1111  ? MONOABS 0.5 10/13/2020 1111  ? EOSABS 0.1 10/13/2020 1111  ? BASOSABS 0.0 10/13/2020 1111  ? ? ?BMET ?   ?Component Value Date/Time  ? NA 140 11/17/2021 0831  ? K 4.2 11/17/2021 0831  ? CL 103 11/17/2021 0831  ? CO2 30 11/17/2021 0831  ? GLUCOSE 96 11/17/2021 0831  ? BUN 14 11/17/2021 0831  ? CREATININE 0.64 11/17/2021 0831  ? CALCIUM 9.3 11/17/2021 0831  ? GFRNONAA >60 10/19/2021 1955  ? GFRAA >60 04/05/2020 1059  ? ? ?BNP ? ? ?ProBNP ?No results found for: PROBNP ? ?Imaging: ?CT Chest High Resolution ? ?Result Date: 11/20/2021 ?CLINICAL DATA:  Interstitial lung disease EXAM: CT CHEST WITHOUT CONTRAST TECHNIQUE: Multidetector CT imaging of the chest was performed following the standard protocol without intravenous contrast. High resolution imaging of the lungs, as well as inspiratory and expiratory imaging, was performed. RADIATION DOSE REDUCTION: This exam was performed according to the departmental dose-optimization program which includes automated exposure control, adjustment of the mA and/or kV according to patient size and/or use of iterative reconstruction technique. COMPARISON:  Chest CT dated September 01, 2020 FINDINGS: Cardiovascular: Normal heart size. No pericardial effusion. Left chest wall cardiac conduction  device with leads in the right atrium and right ventricle. Mild calcified plaque of the thoracic aorta. Mediastinum/Nodes: Esophagus and thyroid are unremarkable. No pathologically enlarged lymph nodes seen in the chest. Lungs/

## 2021-12-14 NOTE — Patient Instructions (Addendum)
Doxycycline 100mg  Twice daily  for 7 days , take with food,  ?Use sunscreen  ?Saline nasal rinses As needed   ?Mucinex DM Twice daily  As needed  cough/congesiton  ?Fluids and rest  ?Oxygen 2l/m At bedtime  ?Albuterol inhaler 1-2 puffs every 6hrs as needed  ?Follow up with Dr. as planned and As needed   ?Please contact office for sooner follow up if symptoms do not improve or worsen or seek emergency care  ? ? ?

## 2021-12-14 NOTE — Assessment & Plan Note (Signed)
Severe obstructive sleep apnea.  Patient has been referred to ENT for consideration of inspire.  She was recommended for CPAP unfortunately patient declines completely.  She will continue on oxygen at bedtime.  Healthy sleep regimen discussed.  Patient education regarding potential for complications of untreated sleep apnea ? ?- discussed how weight can impact sleep and risk for sleep disordered breathing ?- discussed options to assist with weight loss: combination of diet modification, cardiovascular and strength training exercises ?  ?- had an extensive discussion regarding the adverse health consequences related to untreated sleep disordered breathing ?- specifically discussed the risks for hypertension, coronary artery disease, cardiac dysrhythmias, cerebrovascular disease, and diabetes ?- lifestyle modification discussed ?  ?- discussed how sleep disruption can increase risk of accidents, particularly when driving ?- safe driving practices were discussed ?  ? ?Plan  ?Patient Instructions  ?Doxycycline 100mg  Twice daily  for 7 days , take with food,  ?Use sunscreen  ?Saline nasal rinses As needed   ?Mucinex DM Twice daily  As needed  cough/congesiton  ?Fluids and rest  ?Oxygen 2l/m At bedtime  ?Albuterol inhaler 1-2 puffs every 6hrs as needed  ?Follow up with Dr. as planned and As needed   ?Please contact office for sooner follow up if symptoms do not improve or worsen or seek emergency care  ? ? ?  ? ?

## 2021-12-14 NOTE — Assessment & Plan Note (Signed)
Continue on oxygen at bedtime 

## 2021-12-16 ENCOUNTER — Telehealth: Payer: Self-pay | Admitting: Pulmonary Disease

## 2021-12-16 NOTE — Telephone Encounter (Signed)
Spoke to patient ?She is requesting ONO results to be mailed to her address on file. ?It appears that ONo results have been sent to our scan center and have not been uploaded into pt's yet.  ?Lm for Melissa with [facility] to request results.  ?

## 2021-12-17 NOTE — Telephone Encounter (Signed)
Lm x2 for Melissa with [employer].  

## 2021-12-18 NOTE — Progress Notes (Signed)
Agree with the details of the visit as noted by Tammy Parrett, NP.  C. Laura Johni Narine, MD [facility] 

## 2021-12-18 NOTE — Progress Notes (Signed)
Remote pacemaker transmission.   

## 2021-12-21 NOTE — Telephone Encounter (Signed)
ONO received and placed in outgoing mail. ? ?Lm for patient.  ? ?

## 2021-12-21 NOTE — Telephone Encounter (Signed)
Spoke to Anne Rush with [facility] and requested ONO.  ?Will await fax,  ? ?

## 2021-12-22 NOTE — Telephone Encounter (Signed)
Created in error

## 2021-12-22 NOTE — Telephone Encounter (Signed)
Patient is aware of below message and voiced her understanding.  Nothing further needed.   

## 2021-12-24 ENCOUNTER — Telehealth: Payer: Self-pay | Admitting: Pulmonary Disease

## 2021-12-24 NOTE — Telephone Encounter (Signed)
Lm for patient.  

## 2021-12-24 NOTE — Telephone Encounter (Signed)
Spoke to patient.  ?She stated that she has not received ONO. She is aware that that ONO was mailed on 12/22/2021.  ?I suggested that she call back next week if she has not received it. ?Nothing further needed.  ? ?

## 2022-01-07 DIAGNOSIS — I5189 Other ill-defined heart diseases: Secondary | ICD-10-CM | POA: Diagnosis not present

## 2022-01-07 DIAGNOSIS — J9601 Acute respiratory failure with hypoxia: Secondary | ICD-10-CM | POA: Diagnosis not present

## 2022-01-18 ENCOUNTER — Encounter: Payer: BC Managed Care – PPO | Admitting: Cardiology

## 2022-02-01 ENCOUNTER — Encounter: Payer: Self-pay | Admitting: Cardiology

## 2022-02-01 ENCOUNTER — Ambulatory Visit: Payer: BC Managed Care – PPO | Admitting: Cardiology

## 2022-02-01 DIAGNOSIS — I442 Atrioventricular block, complete: Secondary | ICD-10-CM | POA: Diagnosis not present

## 2022-02-01 NOTE — Patient Instructions (Signed)
Medication Instructions:  ?Your physician recommends that you continue on your current medications as directed. Please refer to the Current Medication list given to you today. ? ?*If you need a refill on your cardiac medications before your next appointment, please call your pharmacy* ? ? ?Lab Work: ?None ordered ?If you have labs (blood work) drawn today and your tests are completely normal, you will receive your results only by: ?MyChart Message (if you have MyChart) OR ?A paper copy in the mail ?If you have any lab test that is abnormal or we need to change your treatment, we will call you to review the results. ? ? ?Testing/Procedures: ?None ordered ? ? ?Follow-Up: ?At [facility], you and your health needs are our priority.  As part of our continuing mission to provide you with exceptional heart care, we have created designated Provider Care Teams.  These Care Teams include your primary Cardiologist (physician) and Advanced Practice Providers (APPs -  Physician Assistants and Nurse Practitioners) who all work together to provide you with the care you need, when you need it. ? ?Remote monitoring is used to monitor your Pacemaker or ICD from home. This monitoring reduces the number of office visits required to check your device to one time per year. It allows [place] to keep an eye on the functioning of your device to ensure it is working properly. You are scheduled for a device check from home on 03/11/2022. You may send your transmission at any time that day. If you have a wireless device, the transmission will be sent automatically. After your physician reviews your transmission, you will receive a postcard with your next transmission date. ? ?Your next appointment:   ?1 year(s) ? ?The format for your next appointment:   ?In Person ? ?Provider:   ?Allegra Lai, MD  ? ? ?Thank you for choosing [facility]!! ? ? ?Trinidad Curet, RN ?[phone] ? ? ? ?Other Instructions ? ? ?Important Information About  [place] ? ? ? ? ?  ?

## 2022-02-01 NOTE — Progress Notes (Signed)
? ?Electrophysiology Office Note ? ? ?Date:  02/01/2022  ? ?ID:  Anne Rush, DOB 07/07/1961, MRN [id number] ? ?PCP:  [name], MD  ?Primary Electrophysiologist:  Helmi Hechavarria Jorja Loa, MD   ? ?No chief complaint on file. ? ?  ?History of Present Illness: ?Anne Rush is a 61 y.o. female who presents today for electrophysiology evaluation.    ? ?She has a history significant for hypertension, hyperlipidemia, mild systolic heart failure, complete heart block.  She presented to the hospital with complete heart block.  She is status post Saint Jude dual-chamber pacemaker implanted 02/26/2016. ? ?Today, denies symptoms of palpitations, chest pain, shortness of breath, orthopnea, PND, lower extremity edema, claudication, dizziness, presyncope, syncope, bleeding, or neurologic sequela. The patient is tolerating medications without difficulties.  Since being seen she has done well.  She has no chest pain or shortness of breath.  She is able to do all her daily activities.  She has noted that she is more fatigued.  She was diagnosed with sleep apnea.  She unfortunately refuses to wear CPAP, BiPAP, and has refused inspire. ? ? ?Past Medical History:  ?Diagnosis Date  ? Chronic systolic CHF (congestive heart failure) ([facility]) 07/15/2020  ? Complete heart block ([facility]) 02/26/2016  ? s/p Pacemaker  ? Family history of ovarian cancer   ? Generalized anxiety disorder 07/15/2020  ? GERD (gastroesophageal reflux disease)   ? History of kidney stones   ? Hyperlipidemia   ? Hypertension   ? Interstitial lung disease ([facility])   ? Pollen allergies   ? Presence of permanent cardiac pacemaker   ? Pulmonary fibrosis ([facility])   ? Squamous cell carcinoma 2014  ? skin cancer  ? Subclinical hyperthyroidism 06/17/2014  ? ?Past Surgical History:  ?Procedure Laterality Date  ? CESAREAN SECTION    ? COSMETIC SURGERY    ? skin cancer face  ? CYSTOSCOPY W/ URETERAL STENT PLACEMENT Left 07/15/2020  ? Procedure: CYSTOSCOPY WITH RETROGRADE  PYELOGRAM/URETERAL STENT PLACEMENT;  Surgeon: Riki Altes, MD;  Location: [facility] ORS;  Service: Urology;  Laterality: Left;  ? CYSTOSCOPY/URETEROSCOPY/HOLMIUM LASER/STENT PLACEMENT Left 07/28/2020  ? Procedure: CYSTOSCOPY/URETEROSCOPY/HOLMIUM LASER/STENT Exchange;  Surgeon: Vanna Scotland, MD;  Location: [facility] OR[facility];  Service: Urology;  Laterality: Left;  ? EP IMPLANTABLE DEVICE N/A 02/26/2016  ? Procedure: Pacemaker Implant;  Surgeon: Makaiyah Schweiger Jorja Loa, MD;  Location: [facility];  Service: Cardiovascular;  Laterality: N/A;  ? TUBAL LIGATION    ? ? ? ?Current Outpatient Medications  ?Medication Sig Dispense Refill  ? acetaminophen (TYLENOL) 500 MG tablet Take 1,000 mg by mouth every 6 (six) hours as needed. As needed    ? albuterol (VENTOLIN HFA) 108 (90 Base) MCG/ACT inhaler Inhale 1-2 puffs into the lungs every 6 (six) hours as needed. 8 g 2  ? carvedilol (COREG) 25 MG tablet TAKE ONE TABLET BY MOUTH TWICE A DAY WITH A MEAL 180 tablet 2  ? diazepam (VALIUM) 5 MG tablet TAKE ONE TABLET BY MOUTH EVERY 8 HOURS AS NEEDED FOR ANXIETY 30 tablet 3  ? famotidine (PEPCID) 20 MG tablet Take 1 tablet (20 mg total) by mouth daily. 30 tablet 0  ? spironolactone (ALDACTONE) 25 MG tablet TAKE ONE TABLET BY MOUTH DAILY 90 tablet 3  ? ?No current facility-administered medications for this visit.  ? ?Facility-Administered Medications Ordered in Other Visits  ?Medication Dose Route Frequency Provider Last Rate Last Admin  ? albuterol (PROVENTIL) (2.5 MG/3ML) 0.083% nebulizer solution 2.5 mg  2.5 mg Nebulization  Once Kalman Shan, MD      ? ? ?Allergies:   Codeine, Sulfonamide derivatives, Paroxetine, Lisinopril, Losartan potassium, Neomycin-polymyxin-dexameth, Oxycodone-acetaminophen, and Sulfa antibiotics  ? ?Social History:  The patient  reports that she quit smoking about 11 years ago. Her smoking use included cigarettes. She has a 15.00 pack-year smoking history. She has never used smokeless tobacco. She reports  that she does not drink alcohol and does not use drugs.  ? ?Family History:  The patient's family history includes Anxiety disorder in her mother; Cancer in her maternal aunt; Heart failure in her maternal grandfather; Hypertension in her mother; Ovarian cancer (age of onset: 38) in her mother.  ? ?ROS:  Please see the history of present illness.   Otherwise, review of systems is positive for none.   All other systems are reviewed and negative.  ? ?PHYSICAL EXAM: ?VS:  BP 120/80   Pulse 80   Ht 5\' 3"  (1.6 m)   Wt 209 lb 12.8 oz (95.2 kg)   SpO2 94%   BMI 37.16 kg/m?  , BMI Body mass index is 37.16 kg/m?. ?GEN: Well nourished, well developed, in no acute distress  ?HEENT: normal  ?Neck: no JVD, carotid bruits, or masses ?Cardiac: RRR; no murmurs, rubs, or gallops,no edema  ?Respiratory:  clear to auscultation bilaterally, normal work of breathing ?GI: soft, nontender, nondistended, + BS ?MS: no deformity or atrophy  ?Skin: warm and dry ?Neuro:  Strength and sensation are intact ?Psych: euthymic mood, full affect ? ?EKG:  EKG is not ordered today. ?Personal review of the ekg ordered 10/20/21 shows a sensed, ventricular paced ? ?Recent Labs: ?10/19/2021: Hemoglobin 14.8; Platelets 239 ?11/17/2021: ALT 13; BUN 14; Creatinine, Ser 0.64; Potassium 4.2; Sodium 140; TSH 2.38  ? ? ?Lipid Panel  ?   ?Component Value Date/Time  ? CHOL 199 11/17/2021 0831  ? TRIG 187.0 (H) 11/17/2021 0831  ? HDL 45.50 11/17/2021 0831  ? CHOLHDL 4 11/17/2021 0831  ? VLDL 37.4 11/17/2021 0831  ? LDLCALC 116 (H) 11/17/2021 0831  ? LDLDIRECT 159.0 10/13/2020 1111  ? ? ? ?Wt Readings from Last 3 Encounters:  ?02/01/22 209 lb 12.8 oz (95.2 kg)  ?12/14/21 210 lb 12.8 oz (95.6 kg)  ?11/25/21 210 lb 5 oz (95.4 kg)  ?  ? ? ?Other studies Reviewed: ?Additional studies/ records that were reviewed today include: TTE 05/06/21 ?Review of the above records today demonstrates:  ? 1. Left ventricular ejection fraction, by estimation, is 50 to 55%. The  ?left  ventricle has low normal function. The left ventricle has no regional  ?wall motion abnormalities. The left ventricular internal cavity size was  ?mildly dilated. Left ventricular  ?diastolic parameters were normal.  ? 2. Right ventricular systolic function is normal. The right ventricular  ?size is normal.  ? 3. The mitral valve is normal in structure. No evidence of mitral valve  ?regurgitation.  ? 4. The aortic valve is tricuspid. Aortic valve regurgitation is not  ?visualized.  ? 5. The inferior vena cava is normal in size with greater than 50%  ?respiratory variability, suggesting right atrial pressure of 3 mmHg.  ? ? ?ASSESSMENT AND PLAN: ? ?1.  Complete heart block: Status post Saint Jude dual-chamber pacemaker implanted 02/27/2016.  Device functioning appropriately.  No changes at this time. ? ?2.  Hypertension:well controlled ? ?3.  Chronic systolic heart failure: Low normal ejection fraction on most recent echo.  Currently on carvedilol 25 mg twice daily.  No obvious volume overload ? ?  4.  Severe obstructive sleep apnea: Patient was diagnosed in pulmonary clinic.  She refuses CPAP, BiPAP, or inspire.  I have told her that if she develops cardiac complications, she would need obstructive sleep apnea appropriately treated. ? ? ?Current medicines are reviewed at length with the patient today.   ?The patient does not have concerns regarding her medicines.  The following changes were made today: none ? ?Labs/ tests ordered today include:  ?No orders of the defined types were placed in this encounter. ? ? ? ?Disposition:   FU with Itzae Mccurdy 12 months ? ?Signed, ?Gibran Veselka Jorja Loa, MD  ?02/01/2022 11:17 AM    ? ?[facility] ?[address] ?[address] ?[place] [address] ?[phone] (office) ?[phone] (fax) ?

## 2022-02-02 ENCOUNTER — Telehealth: Payer: Self-pay | Admitting: Cardiology

## 2022-02-02 DIAGNOSIS — R079 Chest pain, unspecified: Secondary | ICD-10-CM

## 2022-02-02 NOTE — Telephone Encounter (Signed)
Patient wanted to talk to [facility] regarding her notes from her visit with Dr.Camnitz yesterday, 02/01/22. It is not urgent  ?

## 2022-02-03 NOTE — Telephone Encounter (Signed)
Pt reports she forgot to mention/discuss some CP she has been experiencing sporadically since the beginning of the year. ?States she had to go to ED in January for CP, but they said it was musculoskeletal.  Says her HRs were in the 40s, BP was 168/103. ?The hospital prescribed Naproxen and this did help relieve discomfort. ? ?However she is still experiencing this. ?Pain is 9 or 10 when occurs. Non radiating, occurs only in her chest. ?Occurs whenever... at rest or with activity  ?Not occurring daily, but sporadically.  "It can happen daily for a week and then not occur".  ?Pain located "underneath the left breast, middle area and to the left side". ?Pain "comes and goes". ? ?She also has SOB daily, but this is not new d/t pulmonary fibrosis ? ?Will forward to Dr. Elberta Fortis for review/advisement. ?Aware it may be end of week before return call. ?Patient verbalized understanding and agreeable to plan.  ? ?

## 2022-02-06 DIAGNOSIS — I5189 Other ill-defined heart diseases: Secondary | ICD-10-CM | POA: Diagnosis not present

## 2022-02-06 DIAGNOSIS — J9601 Acute respiratory failure with hypoxia: Secondary | ICD-10-CM | POA: Diagnosis not present

## 2022-02-08 ENCOUNTER — Other Ambulatory Visit: Payer: Self-pay | Admitting: Family Medicine

## 2022-02-09 ENCOUNTER — Other Ambulatory Visit: Payer: Self-pay | Admitting: Cardiology

## 2022-02-17 NOTE — Telephone Encounter (Signed)
Pt informed that I will  readdress with Dr. Elberta Fortis this week. Aware I will follow up with her soon, by next week. Patient verbalized understanding and agreeable to plan.

## 2022-02-19 NOTE — Telephone Encounter (Signed)
Pt aware MD recommends coronary CT. Pt agreeable and aware office/hospital will call to arrange once approved. Patient verbalized understanding and agreeable to plan.   She is going to have BMET at PCP b/c it doesn't cost her anything if done there.

## 2022-02-23 ENCOUNTER — Other Ambulatory Visit: Payer: Self-pay | Admitting: Family Medicine

## 2022-02-23 NOTE — Telephone Encounter (Signed)
Last office visit 11/25/21 for CPE.  Last refilled 09/09/2021 for #30 with 3 refills.  No future appointments with PCP.

## 2022-03-01 ENCOUNTER — Telehealth: Payer: Self-pay | Admitting: Pulmonary Disease

## 2022-03-01 NOTE — Telephone Encounter (Signed)
Spoke to patient.  She stated that she has been in contact with [employer] regarding nocturnal oxygen. She was told that [employer] would cover all by 31.98 monthly, however she received a bill for $71.98. She stated that she has attempted to contact [employer]'s billing department without success.  Lm for Melissa with [employer].

## 2022-03-02 NOTE — Telephone Encounter (Signed)
Spoke to [place] with [employer]. She stated that she would research this and call back with update.

## 2022-03-04 NOTE — Telephone Encounter (Signed)
Spoke to [place] with [employer].  Melissa stated that patient has a remaining deductible of 753.52. once this has been met, monthly payment will decrease to 31.98. If this is not affordable, patient can apply for hardship assistance.  Melissa will contact patient to explain this information.

## 2022-03-04 NOTE — Telephone Encounter (Signed)
Spoke to [place] with [employer] for update. She stated that she is still in the process of researching this. She will call back with update.

## 2022-03-05 NOTE — Telephone Encounter (Signed)
ATC patient-unable to leave vm due to mailbox being full.  Will call back.  

## 2022-03-09 DIAGNOSIS — J9601 Acute respiratory failure with hypoxia: Secondary | ICD-10-CM | POA: Diagnosis not present

## 2022-03-09 DIAGNOSIS — I5189 Other ill-defined heart diseases: Secondary | ICD-10-CM | POA: Diagnosis not present

## 2022-03-09 NOTE — Telephone Encounter (Signed)
Spoke to patient.  She stated that Anne Rush with [employer] reached out to her to explain billing.  Per patient, nothing further is needed at this time.

## 2022-03-10 ENCOUNTER — Encounter: Payer: Self-pay | Admitting: Pulmonary Disease

## 2022-03-10 ENCOUNTER — Ambulatory Visit: Payer: BC Managed Care – PPO | Admitting: Pulmonary Disease

## 2022-03-10 VITALS — BP 124/74 | HR 94 | Temp 97.7°F | Ht 63.0 in | Wt 210.0 lb

## 2022-03-10 DIAGNOSIS — G4734 Idiopathic sleep related nonobstructive alveolar hypoventilation: Secondary | ICD-10-CM

## 2022-03-10 DIAGNOSIS — G4733 Obstructive sleep apnea (adult) (pediatric): Secondary | ICD-10-CM

## 2022-03-10 DIAGNOSIS — J849 Interstitial pulmonary disease, unspecified: Secondary | ICD-10-CM

## 2022-03-10 NOTE — Patient Instructions (Addendum)
We will start CPAP, I have ordered a mask that you can sleep on your side if you want to with.  When you start your CPAP you will not have oxygen at that time we will measure the oxygen level on your CPAP to see how you do.  You may not need additional oxygen which would be good.  We will see you in follow-up in 3 months time call sooner should any new problems arise.

## 2022-03-10 NOTE — Progress Notes (Signed)
Subjective:    Patient ID: Anne Rush, female    DOB: 21-Nov-1960, 61 y.o.   MRN: [id number] Patient Care Team: Anne Beat, MD as PCP - General (Family Medicine) Anne Lemming, MD as PCP - Electrophysiology (Cardiology) Anne Rush, Anne Ramp, MD as PCP - Cardiology (Cardiology)  Chief Complaint  Patient presents with   Follow-up    SOB with exertion and occ dry cough.    HPI Patient is a 61 year old former smoker quit 2012, 15-pack-year history) who presents for follow-up of interstitial lung disease and fatigue.  This is a scheduled visit.  I last saw her on 10 November 2021 and then subsequently she was seen by Anne Oaks, NP on 14 December 2021.  She did have a sleep study finally on 29 September 2021 that showed that she has SEVERE sleep apnea in addition she has hypoxemia outside of obstructive episodes which may indicate sleep-related hypoxia/hypoventilation.  It is recommended that she undergo a formal titration study however she is refusing this.  She states that she does not want to wear a CPAP mask, after much discussion she is willing to give it a try. I have advised her that untreated sleep apnea will be exceedingly detrimental to her.   She continues to have some issues with shortness of breath.  She had underlying RB ILD and then 2 episodes of COVID 1 in August and then relapse in October 2022 this may have left her with significant postinflammatory fibrosis as well however, this has not been evident on plain films.  He had reassessment with high-resolution CT chest which showed that her upper lobe predominant fibrotic interstitial lung disease and she had a groundglass opacities had actually improve from December 2021.  Findings were not consistent with UIP, fibrotic HP less likely due to no air trapping noted on high-res CT.   She does not endorse any new symptomatology.  Mostly fatigue and some shortness of breath on exertion.  No fevers, chills or sweats.  Very  poor sleep quality with nonrestorative sleep.  She looks a sleep deprived.     Review of Systems A 10 point review of systems was performed and it is as noted above otherwise negative.  Patient Active Problem List   Diagnosis Date Noted   COVID-19 virus infection 09/02/2022   Acute encephalopathy 09/02/2022   GERD without esophagitis 09/02/2022   OSA (obstructive sleep apnea) 12/14/2021   Nocturnal hypoxemia 12/14/2021   GERD (gastroesophageal reflux disease) 07/15/2020   Generalized anxiety disorder 07/15/2020   Chronic systolic CHF (congestive heart failure) (HCC) 07/15/2020   Interstitial lung disease (HCC) 04/18/2020   Diastolic dysfunction 04/02/2020   Pacemaker 10/01/2018   Vitamin D deficiency 01/11/2018   Allergic angioedema 03/17/2016   Complete heart block (HCC) s/p Pacemaker 02/26/2016   Former tobacco use 08/28/2008   Hyperlipidemia LDL goal <100 06/21/2007   GAD (generalized anxiety disorder) 06/05/2007   Essential hypertension 06/05/2007   ALLERGIC RHINITIS 06/05/2007   Social History   Tobacco Use   Smoking status: Former    Packs/day: 0.50    Years: 30.00    Additional pack years: 0.00    Total pack years: 15.00    Types: Cigarettes    Quit date: 2012    Years since quitting: 12.4   Smokeless tobacco: Never  Substance Use Topics   Alcohol use: No   Allergies  Allergen Reactions   Codeine Hives and Itching   Sulfonamide Derivatives Hives   Paroxetine Other (See Comments)  unknown   Lisinopril Rash   Losartan Potassium Other (See Comments)    Leg felt heavy   Neomycin-Polymyxin-Dexameth Itching, Swelling and Other (See Comments)    Ey drop/Swelling and itching of eye    Oxycodone-Acetaminophen Other (See Comments)    Mild SOB, feels funny   Sulfa Antibiotics Hives and Rash        Current Meds  Medication Sig   acetaminophen (TYLENOL) 500 MG tablet Take 1,000 mg by mouth every 6 (six) hours as needed. As needed   albuterol (VENTOLIN HFA)  108 (90 Base) MCG/ACT inhaler Inhale 2 puffs into the lungs every 6 (six) hours as needed for wheezing or shortness of breath.   carvedilol (COREG) 25 MG tablet TAKE ONE TABLET BY MOUTH TWICE A DAY WITH MEALS   famotidine (PEPCID) 20 MG tablet Take 1 tablet (20 mg total) by mouth daily.   diazepam (VALIUM) 5 MG tablet TAKE ONE TABLET BY MOUTH EVERY 8 HOURS AS NEEDED FOR ANXIETY   spironolactone (ALDACTONE) 25 MG tablet TAKE ONE TABLET BY MOUTH DAILY   Immunization History  Administered Date(s) Administered   Influenza Nasal 06/20/2017   Influenza Whole 07/04/2008, 06/18/2009, 08/27/2010   Influenza, Seasonal, Injecte, Preservative Fre 09/03/2010, 06/22/2011, 06/27/2012   Influenza,inj,Quad PF,6+ Mos 07/21/2015, 07/06/2018, 08/10/2021   Influenza-Unspecified 06/27/2012, 07/17/2014, 07/19/2016, 07/21/2019, 07/17/2022   Td 05/10/2002   Tdap 10/28/2014   Zoster Recombinat (Shingrix) 12/08/2022, 02/08/2023       Objective:   Physical Exam BP 124/74 (BP Location: Left Arm, Cuff Size: Large)   Pulse 94   Temp 97.7 F (36.5 C) (Temporal)   Ht 5\' 3"  (1.6 m)   Wt 210 lb (95.3 kg)   SpO2 95%   BMI 37.20 kg/m   SpO2: 95 % O2 Device: None (Room air)  GENERAL: Obese woman, no acute distress.  Appears fatigued.  Fully ambulatory.  No conversational dyspnea.  HEAD: Normocephalic, atraumatic.  EYES: Pupils equal, round, reactive to light.  No scleral icterus.  Mild periorbital edema/puffiness. MOUTH: Poor dentition, Mallampati III airway. NECK: Supple. No thyromegaly. Trachea midline. No JVD.  No adenopathy. PULMONARY: Good air entry bilaterally.  No adventitious sounds.  Overt crackles. CARDIOVASCULAR: S1 and S2. Regular rate and rhythm.  Pacer dependent.  No murmurs. ABDOMEN: Obese otherwise benign. MUSCULOSKELETAL: No joint deformity,+ clubbing (mild), no edema.  NEUROLOGIC: No focal deficit, no gait disturbance, speech is fluent. SKIN: Intact,warm,dry. PSYCH: Mood and behavior  normal.       Assessment & Plan:     ICD-10-CM   1. OSA (obstructive sleep apnea) - AHI 45.3, SEVERE  G47.33 AMB REFERRAL FOR DME   SEVERE AHI 45.3, O2 sat nadir 68% (09/2021) Show now willing to try CPAP Recommend auto CPAP 5 to 20 cm H2O ResMed AirFit P30 I mask Orders placed    2. Nocturnal hypoxemia  G47.34    Appears to have underlying hypoventilation Continue nocturnal oxygen    3. Interstitial lung disease (HCC)  J84.9    Most recent high-res CT not consistent with UIP On most recent high-res CT 2 March appeared improving Follow with spirometry, 6 minwalk and high-res CT     Orders Placed This Encounter  Procedures   AMB REFERRAL FOR DME    Referral Priority:   Routine    Referral Type:   Durable Medical Equipment Purchase    Number of Visits Requested:   1   Will see the patient in follow-up in 3 months time she is to call  sooner should any new problems arise.  Anne Shelter, MD Advanced Bronchoscopy PCCM [facility]    *This note was dictated using voice recognition software/Dragon.  Despite best efforts to proofread, errors can occur which can change the meaning. Any transcriptional errors that result from this process are unintentional and may not be fully corrected at the time of dictation.

## 2022-03-11 ENCOUNTER — Ambulatory Visit (INDEPENDENT_AMBULATORY_CARE_PROVIDER_SITE_OTHER): Payer: BC Managed Care – PPO

## 2022-03-11 DIAGNOSIS — I442 Atrioventricular block, complete: Secondary | ICD-10-CM | POA: Diagnosis not present

## 2022-03-11 LAB — CUP PACEART REMOTE DEVICE CHECK
Battery Remaining Longevity: 38 mo
Battery Remaining Percentage: 32 %
Battery Voltage: 2.92 V
Brady Statistic AP VP Percent: 1 %
Brady Statistic AP VS Percent: 0 %
Brady Statistic AS VP Percent: 99 %
Brady Statistic AS VS Percent: 1 %
Brady Statistic RA Percent Paced: 1 %
Brady Statistic RV Percent Paced: 99 %
Date Time Interrogation Session: 20230622020014
Implantable Lead Implant Date: 20170608
Implantable Lead Implant Date: 20170608
Implantable Lead Location: 753859
Implantable Lead Location: 753860
Implantable Pulse Generator Implant Date: 20170608
Lead Channel Impedance Value: 440 Ohm
Lead Channel Impedance Value: 450 Ohm
Lead Channel Pacing Threshold Amplitude: 0.75 V
Lead Channel Pacing Threshold Amplitude: 1.125 V
Lead Channel Pacing Threshold Pulse Width: 0.4 ms
Lead Channel Pacing Threshold Pulse Width: 0.4 ms
Lead Channel Sensing Intrinsic Amplitude: 4.1 mV
Lead Channel Setting Pacing Amplitude: 1.375
Lead Channel Setting Pacing Amplitude: 2 V
Lead Channel Setting Pacing Pulse Width: 0.4 ms
Lead Channel Setting Sensing Sensitivity: 2.5 mV
Pulse Gen Model: 2272
Pulse Gen Serial Number: 7903881

## 2022-03-18 ENCOUNTER — Other Ambulatory Visit: Payer: Self-pay

## 2022-03-18 ENCOUNTER — Encounter: Payer: Self-pay | Admitting: Family Medicine

## 2022-03-18 DIAGNOSIS — R0609 Other forms of dyspnea: Secondary | ICD-10-CM

## 2022-03-18 DIAGNOSIS — I5022 Chronic systolic (congestive) heart failure: Secondary | ICD-10-CM

## 2022-03-18 NOTE — Telephone Encounter (Signed)
Can you help set her up for this?  BMP: dyspnea on exertion, chronic systolic CHF

## 2022-03-18 NOTE — Telephone Encounter (Signed)
Called patient lab order placed and app made. She will come by tomorrow Morning.

## 2022-03-18 NOTE — Progress Notes (Signed)
Remote pacemaker transmission.   

## 2022-03-19 ENCOUNTER — Other Ambulatory Visit (INDEPENDENT_AMBULATORY_CARE_PROVIDER_SITE_OTHER): Payer: BC Managed Care – PPO

## 2022-03-19 DIAGNOSIS — I5022 Chronic systolic (congestive) heart failure: Secondary | ICD-10-CM | POA: Diagnosis not present

## 2022-03-19 DIAGNOSIS — R0609 Other forms of dyspnea: Secondary | ICD-10-CM | POA: Diagnosis not present

## 2022-03-19 LAB — BASIC METABOLIC PANEL
BUN: 14 mg/dL (ref 6–23)
CO2: 31 mEq/L (ref 19–32)
Calcium: 9.3 mg/dL (ref 8.4–10.5)
Chloride: 102 mEq/L (ref 96–112)
Creatinine, Ser: 0.72 mg/dL (ref 0.40–1.20)
GFR: 91.61 mL/min (ref >60.00)
Glucose, Bld: 107 mg/dL — ABNORMAL HIGH (ref 70–99)
Potassium: 3.8 mEq/L (ref 3.5–5.1)
Sodium: 140 mEq/L (ref 135–145)

## 2022-03-22 ENCOUNTER — Telehealth (HOSPITAL_COMMUNITY): Payer: Self-pay | Admitting: *Deleted

## 2022-03-22 NOTE — Telephone Encounter (Signed)
Attempted to call patient regarding upcoming cardiac CT appointment. °Left message on voicemail with name and callback number ° °Kadey Mihalic RN Navigator Cardiac Imaging °[facility] °[phone] Office °[phone] Cell ° °

## 2022-03-22 NOTE — Telephone Encounter (Signed)
Patient returning call stating she would like to reschedule her cardiac CT scan.  She also states she was unwilling to take any medications to lower her HR outside of her regular carvedilol. She states her HR ranges from 70's to 90s. I explained to her the importance of a low HR for the test and she currently does not want to pursue the CT scan. Will inform Dr. Elberta Fortis.  Larey Brick RN Navigator Cardiac Imaging [facility] [phone] Office [phone] Cell

## 2022-03-25 ENCOUNTER — Ambulatory Visit: Admission: RE | Admit: 2022-03-25 | Payer: BC Managed Care – PPO | Source: Ambulatory Visit

## 2022-04-05 NOTE — Telephone Encounter (Signed)
Apologized to pt for delay in getting back with her. Discussed getting this rescheduled. Aware Dr. Elberta Fortis is on vacation this week, but will address with him next week and let her know. She is very nervous about taking anything extra/different without discussing w/ Dr. Elberta Fortis. She is very sensitive to medications and this makes her weary. BP at home avg 120s/70s. HRs can vary. Currently taking Coreg 25 mg BID. States she will need anxiety medication prior to this test as well, she has this on hand at home. Aware I will further discuss medication & location w/ MD. Patient verbalized understanding and agreeable to plan.

## 2022-04-08 DIAGNOSIS — I5189 Other ill-defined heart diseases: Secondary | ICD-10-CM | POA: Diagnosis not present

## 2022-04-08 DIAGNOSIS — J9601 Acute respiratory failure with hypoxia: Secondary | ICD-10-CM | POA: Diagnosis not present

## 2022-04-27 ENCOUNTER — Ambulatory Visit (HOSPITAL_COMMUNITY): Payer: BC Managed Care – PPO

## 2022-04-29 ENCOUNTER — Encounter: Payer: Self-pay | Admitting: Family Medicine

## 2022-04-29 ENCOUNTER — Telehealth: Payer: BC Managed Care – PPO | Admitting: Family Medicine

## 2022-04-29 VITALS — BP 134/96 | HR 75 | Temp 95.9°F | Ht 63.0 in

## 2022-04-29 DIAGNOSIS — H6501 Acute serous otitis media, right ear: Secondary | ICD-10-CM

## 2022-04-29 MED ORDER — AMOXICILLIN 875 MG PO TABS: 875.0000 mg | ORAL_TABLET | Freq: Two times a day (BID) | ORAL | 0 refills | Status: DC

## 2022-04-29 NOTE — Progress Notes (Signed)
Anne Rush T. Shamonica Schadt, MD, CAQ Sports Medicine [facility] [address] [place], [address]  Phone: [phone]  FAX: [phone]  Anne Rush - 61 y.o. female  MRN [id number]  Date of Birth: 1961-08-28  Date: 04/29/2022  PCP: [name], MD  Referral: [name], MD  Chief Complaint  Patient presents with   Ear Pain    Right Symptoms started yesterday.  Negative Home Covid test yesterday   Facial Pain   Fatigue    Wanting to sleep all the time   Virtual Visit via Video Note:  I connected with  Anne Rush on 04/29/2022 11:40 AM EDT by a video enabled telemedicine application and verified that I am speaking with the correct person using two identifiers.   Location patient: home computer, tablet, or smartphone Location provider: work or home office Consent: Verbal consent directly obtained from [facility]. Persons participating in the virtual visit: patient, provider  I discussed the limitations of evaluation and management by telemedicine and the availability of in person appointments. The patient expressed understanding and agreed to proceed.  Chief Complaint  Patient presents with   Ear Pain    Right Symptoms started yesterday.  Negative Home Covid test yesterday   Facial Pain   Fatigue    Wanting to sleep all the time    History of Present Illness:  Interactive audio and video telecommunications were attempted between this provider and patient, however failed, due to patient having technical difficulties OR patient did not have access to video capability.  We continued and completed visit with audio only.    She generally has not been feeling well over the past few days.  She basically started to feel bad yesterday, she developed some pain in her right ear, she also has been having some pain in her right upper facial region.  She is generally fatigued and feeling like she needs to go to sleep all the  time.  She did take a COVID test, and this was negative at home.  She is eating and drinking normally She has had temperatures that are been on the lower side.  She also worried a bit about her blood pressure, but they have ranged on the order of 105-135/60-80. Pulse is 60-70.  R ear started to hurt - temp was low.  R sinus and R ear. Had a cough today, too.      Review of Systems as above: See pertinent positives and pertinent negatives per HPI No acute distress verbally   Observations/Objective/Exam:  An attempt was made to discern vital signs over the phone and per patient if applicable and possible.   General:    Alert, Oriented, appears well and in no acute distress  Pulmonary:     On inspection no signs of respiratory distress.  Psych / Neurological:     Pleasant and cooperative.  Assessment and Plan:    ICD-10-CM   1. Right acute serous otitis media, recurrence not specified  H65.01      It is hard for me to know for sure over the telephone.  Otitis media versus right-sided frontal sinusitis.  These would be the most likely diagnoses.  I will treat with some amoxicillin.  I discussed the assessment and treatment plan with the patient. The patient was provided an opportunity to ask questions and all were answered. The patient agreed with the plan and demonstrated an understanding of the instructions.   The patient  was advised to call back or seek an in-person evaluation if the symptoms worsen or if the condition fails to improve as anticipated.  Follow-up: prn unless noted otherwise below No follow-ups on file.  Meds ordered this encounter  Medications   amoxicillin (AMOXIL) 875 MG tablet    Sig: Take 1 tablet (875 mg total) by mouth 2 (two) times daily.    Dispense:  20 tablet    Refill:  0   No orders of the defined types were placed in this encounter.   Signed,  Elpidio Galea. Kiara Mcdowell, MD

## 2022-05-05 ENCOUNTER — Telehealth: Payer: Self-pay | Admitting: Family Medicine

## 2022-05-05 MED ORDER — AMOXICILLIN 400 MG/5ML PO SUSR: ORAL | 0 refills | Status: DC

## 2022-05-05 NOTE — Telephone Encounter (Signed)
No diagnosis found.  Medication Management during today's office visit: Meds ordered this encounter  Medications   amoxicillin (AMOXIL) 400 MG/5ML suspension    Sig: 12.5 mL po BID for 7 days    Dispense:  175 mL    Refill:  0   Medications Discontinued During This Encounter  Medication Reason   amoxicillin (AMOXIL) 875 MG tablet

## 2022-05-05 NOTE — Telephone Encounter (Signed)
amoxicillin (AMOXIL) 875 MG tablet  Eight left, 5 disintegrated in mouth, putrid egg smell She is unable to tolerate, makes her sick and not able to break it the tablet up  She needs a liquid for amoxicillin  [facility] [id number] - [place], [place] - [address] (Ph: [phone])

## 2022-05-09 DIAGNOSIS — I5189 Other ill-defined heart diseases: Secondary | ICD-10-CM | POA: Diagnosis not present

## 2022-05-09 DIAGNOSIS — J9601 Acute respiratory failure with hypoxia: Secondary | ICD-10-CM | POA: Diagnosis not present

## 2022-05-25 ENCOUNTER — Encounter (HOSPITAL_COMMUNITY): Payer: Self-pay

## 2022-06-01 ENCOUNTER — Telehealth: Payer: Self-pay | Admitting: Cardiovascular Disease

## 2022-06-01 NOTE — Telephone Encounter (Signed)
Spoke with pt, she reports she is returning a call to sherri dr Elberta Fortis nurse. Apologized for the confusion and will forward to sherri.

## 2022-06-01 NOTE — Telephone Encounter (Signed)
Patient states she was returning call. Please advise  

## 2022-06-04 NOTE — Telephone Encounter (Signed)
Followed up with pt who reports she is still doing well and doesn't feel we need to pursue CT testing still. She states she will let me know if she feels this should be readdressed. She appreciates my  follow up

## 2022-06-09 ENCOUNTER — Telehealth: Payer: Self-pay | Admitting: Pulmonary Disease

## 2022-06-09 DIAGNOSIS — I5189 Other ill-defined heart diseases: Secondary | ICD-10-CM | POA: Diagnosis not present

## 2022-06-09 DIAGNOSIS — J9601 Acute respiratory failure with hypoxia: Secondary | ICD-10-CM | POA: Diagnosis not present

## 2022-06-09 NOTE — Telephone Encounter (Signed)
That will be okay.  I still would want for her to be seen by one of the sleep specialists in our case who would be Dr. Halford Chessman.  Her sleep apnea is so severe that I am not sure that oxygen alone is going to be sufficient.

## 2022-06-09 NOTE — Telephone Encounter (Signed)
Spoke with the pt and notified of response per Dr Darnell Level  I have changed visit to video and she is aware needs to see Sleep- prefers to talk about this more via video visit  Nothing further needed

## 2022-06-09 NOTE — Telephone Encounter (Signed)
Dr Patsey Berthold, are you okay with making her rov tomorrow a video visit due to her covid exp? I spoke with her earlier this am, and she states still wants to keep appt despite not starting CPAP. She says she did her own research and decided to just use o2 with sleep, bc she does not feel would tolerate CPAP   Please advise, thanks!

## 2022-06-10 ENCOUNTER — Ambulatory Visit (INDEPENDENT_AMBULATORY_CARE_PROVIDER_SITE_OTHER): Payer: BC Managed Care – PPO

## 2022-06-10 ENCOUNTER — Telehealth (INDEPENDENT_AMBULATORY_CARE_PROVIDER_SITE_OTHER): Payer: BC Managed Care – PPO | Admitting: Pulmonary Disease

## 2022-06-10 DIAGNOSIS — G4734 Idiopathic sleep related nonobstructive alveolar hypoventilation: Secondary | ICD-10-CM

## 2022-06-10 DIAGNOSIS — G4733 Obstructive sleep apnea (adult) (pediatric): Secondary | ICD-10-CM

## 2022-06-10 DIAGNOSIS — I442 Atrioventricular block, complete: Secondary | ICD-10-CM

## 2022-06-10 DIAGNOSIS — J849 Interstitial pulmonary disease, unspecified: Secondary | ICD-10-CM | POA: Diagnosis not present

## 2022-06-10 LAB — CUP PACEART REMOTE DEVICE CHECK
Battery Remaining Longevity: 36 mo
Battery Remaining Percentage: 29 %
Battery Voltage: 2.9 V
Brady Statistic AP VP Percent: 1 %
Brady Statistic AP VS Percent: 1 %
Brady Statistic AS VP Percent: 99 %
Brady Statistic AS VS Percent: 1 %
Brady Statistic RA Percent Paced: 1 %
Brady Statistic RV Percent Paced: 99 %
Date Time Interrogation Session: 20230921020012
Implantable Lead Implant Date: 20170608
Implantable Lead Implant Date: 20170608
Implantable Lead Location: 753859
Implantable Lead Location: 753860
Implantable Pulse Generator Implant Date: 20170608
Lead Channel Impedance Value: 440 Ohm
Lead Channel Impedance Value: 450 Ohm
Lead Channel Pacing Threshold Amplitude: 0.75 V
Lead Channel Pacing Threshold Amplitude: 1 V
Lead Channel Pacing Threshold Pulse Width: 0.4 ms
Lead Channel Pacing Threshold Pulse Width: 0.4 ms
Lead Channel Sensing Intrinsic Amplitude: 4 mV
Lead Channel Setting Pacing Amplitude: 1.25 V
Lead Channel Setting Pacing Amplitude: 2 V
Lead Channel Setting Pacing Pulse Width: 0.4 ms
Lead Channel Setting Sensing Sensitivity: 2.5 mV
Pulse Gen Model: 2272
Pulse Gen Serial Number: 7903881

## 2022-06-10 NOTE — Progress Notes (Signed)
Subjective:    Patient ID: Anne Rush, female    DOB: 1961-06-19, 61 y.o.   MRN: [id number] ~~~~~~~~~~~~~~~~~~~~~~~~~~~~~~~~~~~~~~~~~~~~~~~~~~~~~~~~~ Virtual Visit Telephone Note:   This visit type was conducted due to national recommendations for restrictions regarding the COVID-19 pandemic .  This format is felt to be most appropriate for this patient at this time.  All issues noted in this document were discussed and addressed.  No physical exam was performed (except for noted visual exam findings with Video Visits).    I connected with Zaliyah Dann by secure HIPAA compliant video at 950 hours, however, patient's video modality was not working and audio was not working as well.  The visit was then converted to via telephone visit.  I verified that I was speaking with the correct person using two identifiers. Location patient: home Location provider: [facility] Persons participating in the virtual visit: patient, physician   I discussed the limitations, risks, security and privacy concerns of performing an evaluation and management service by telephone and the availability of in person appointments. The patient expressed understanding and agreed to proceed. ~~~~~~~~~~~~~~~~~~~~~~~~~~~~~~~~~~~~~~~~~~~~~~~~~~~~~~~~  HPI Patient is a 61 year old former smoker (quit 2012, 15-pack-year history) who presents for follow-up of predominant interstitial lung disease and fatigue.  This is a scheduled visit.  She was last seen here on 10 March 2022.  She did have a sleep study finally on 29 September 2021 that showed that she has SEVERE sleep apnea in addition she has hypoxemia outside of obstructive episodes which may indicate sleep-related hypoxia/hypoventilation.  It is recommended that she undergo a formal titration study however she is refusing this.  She states that she does not want to wear a CPAP mask because it can "mess up her hair".  I am not certain that she will qualify for an  inspire device given that she has a pacemaker in place also BMI is greater than 32 (37.8 at last visit) however, she would like to undergo formal evaluation by ENT.  I have advised her that untreated sleep apnea will be exceedingly detrimental to her.  We connected today via phone due to recent exposure to COVID-19.  At her last visit we had provided her with a ResMed AirFit mask to see if this would work better for her.  She however is not using CPAP.  She is on nocturnal oxygen however her apneic episodes persist according to her husband who has observed her.  This is not unusual as oxygen can prolong the apneic episodes.  I had advised the patient in this regard previously but she nevertheless wanted to try oxygen.  We discussed that it would be best for her to at least have a visit with our sleep specialist Dr. Halford Chessman to discuss the issues for her sleep apnea further.  Her sleep apnea is quite severe with an AHI of 45.3 with oxygen desaturations as low as 68% and underlying hypoxia/hypoventilation.   Review of Systems A 10 point review of systems was performed and it is as noted above otherwise negative.  Patient Active Problem List   Diagnosis Date Noted   OSA (obstructive sleep apnea) 12/14/2021   Nocturnal hypoxemia 12/14/2021   Acute bronchitis 12/14/2021   Acute non-recurrent frontal sinusitis 10/06/2021   GERD (gastroesophageal reflux disease) [id number]   Chronic systolic CHF (congestive heart failure) ([place]) 07/15/2020   Interstitial lung disease ([place]) Q000111Q   Diastolic dysfunction 123XX123   Pacemaker 10/01/2018   Vitamin D deficiency 01/11/2018   Allergic angioedema 03/17/2016  Complete heart block ([place]) s/p Pacemaker 02/26/2016   Former tobacco use 08/28/2008   Hyperlipidemia LDL goal <100 06/21/2007   GAD (generalized anxiety disorder) 06/05/2007   Essential hypertension 06/05/2007   ALLERGIC RHINITIS 06/05/2007   Social History   Tobacco Use   Smoking status:  Former    Packs/day: 0.50    Years: 30.00    Total pack years: 15.00    Types: Cigarettes    Quit date: 2012    Years since quitting: 11.7   Smokeless tobacco: Never  Substance Use Topics   Alcohol use: No   Allergies  Allergen Reactions   Codeine Hives and Itching   Sulfonamide Derivatives Hives   Paroxetine Other (See Comments)    unknown   Lisinopril Rash   Losartan Potassium Other (See Comments)    Leg felt heavy   Neomycin-Polymyxin-Dexameth Itching, Swelling and Other (See Comments)    Ey drop/Swelling and itching of eye    Oxycodone-Acetaminophen Other (See Comments)    Mild SOB, feels funny   Sulfa Antibiotics Hives and Rash        Reviewed the patient's medications with her.  Immunization History  Administered Date(s) Administered   Influenza Nasal 06/20/2017   Influenza Whole 07/04/2008, 06/18/2009, 08/27/2010   Influenza, Seasonal, Injecte, Preservative Fre 09/03/2010, 06/22/2011, 06/27/2012   Influenza,inj,Quad PF,6+ Mos 07/21/2015, 07/06/2018, 08/10/2021   Influenza-Unspecified 06/27/2012, 07/17/2014, 07/19/2016, 07/21/2019   Td 05/10/2002   Tdap 10/28/2014      Objective:   Physical Exam Due to the nature of the visit, no physical exam was performed.  Patient did not exhibit conversational dyspnea during the visit.     Assessment & Plan:     ICD-10-CM   1. OSA (obstructive sleep apnea)  G47.33    Patient has been reluctant to try therapies to handle OSA OSA is SEVERE We discussed pathophysiology of OSA    2. Nocturnal hypoxemia  G47.34    Currently on nocturnal oxygen    3. Interstitial lung disease ([place])  J84.9    Consistent with respiratory bronchiolitis No evidence of progression     Will have the patient see one of our sleep providers, she has complex issues with sleep apnea.  She is very reluctant to follow recommendations with regards to management of sleep apnea.  This issue adds complexity to her management.  We will see her in  follow-up in months time she is to contact us prior to that time should any new problems arise.  Total visit time: 19 minutes.  Renold Don, MD Advanced Bronchoscopy PCCM [facility]    *This note was dictated using voice recognition software/Dragon.  Despite best efforts to proofread, errors can occur which can change the meaning. Any transcriptional errors that result from this process are unintentional and may not be fully corrected at the time of dictation.

## 2022-06-21 NOTE — Progress Notes (Signed)
Remote pacemaker transmission.   

## 2022-06-22 ENCOUNTER — Other Ambulatory Visit: Payer: Self-pay | Admitting: Family Medicine

## 2022-06-22 NOTE — Telephone Encounter (Signed)
Last office visit (virtual) for ear/facial pain pain and fatigue.  Last refilled 02/23/22 for #30 with 3 refills.  No future appointments with PCP.

## 2022-06-28 DIAGNOSIS — Z1239 Encounter for other screening for malignant neoplasm of breast: Secondary | ICD-10-CM | POA: Diagnosis not present

## 2022-06-28 DIAGNOSIS — Z01419 Encounter for gynecological examination (general) (routine) without abnormal findings: Secondary | ICD-10-CM | POA: Diagnosis not present

## 2022-06-28 DIAGNOSIS — Z124 Encounter for screening for malignant neoplasm of cervix: Secondary | ICD-10-CM | POA: Diagnosis not present

## 2022-06-28 LAB — RESULTS CONSOLE HPV: CHL HPV: NEGATIVE

## 2022-06-28 LAB — HM PAP SMEAR

## 2022-06-30 ENCOUNTER — Ambulatory Visit: Payer: BC Managed Care – PPO | Admitting: Family Medicine

## 2022-06-30 ENCOUNTER — Encounter: Payer: Self-pay | Admitting: Family Medicine

## 2022-06-30 VITALS — BP 104/70 | HR 80 | Temp 98.3°F | Ht 63.0 in | Wt 208.4 lb

## 2022-06-30 DIAGNOSIS — R051 Acute cough: Secondary | ICD-10-CM

## 2022-06-30 DIAGNOSIS — J208 Acute bronchitis due to other specified organisms: Secondary | ICD-10-CM | POA: Diagnosis not present

## 2022-06-30 DIAGNOSIS — J849 Interstitial pulmonary disease, unspecified: Secondary | ICD-10-CM | POA: Diagnosis not present

## 2022-06-30 DIAGNOSIS — M19041 Primary osteoarthritis, right hand: Secondary | ICD-10-CM | POA: Diagnosis not present

## 2022-06-30 LAB — POC COVID19 BINAXNOW: SARS Coronavirus 2 Ag: NEGATIVE

## 2022-06-30 MED ORDER — DOXYCYCLINE HYCLATE 100 MG PO TABS: 100.0000 mg | ORAL_TABLET | Freq: Two times a day (BID) | ORAL | 0 refills | Status: DC

## 2022-06-30 NOTE — Progress Notes (Signed)
Jyaire Koudelka T. Armella Stogner, MD, CAQ Sports Medicine [facility] at [facility] [address] [place], [address]  Phone: [phone]  FAX: [phone]  Anne Rush - 61 y.o. female  MRN [id number]  Date of Birth: 08-Mar-1961  Date: 06/30/2022  PCP: [name], MD  Referral: [name], MD  Chief Complaint  Patient presents with   Headache    Symptoms started Saturday evening Negative home Covid test on 10/9   Nasal Congestion   Cough    With dark green phlegm   Hand Pain    Right    Subjective:   Anne Rush is a 61 y.o. very pleasant female patient with Body mass index is 36.91 kg/m. who presents with the following:  Patient presents with some acute nasal congestion and cough.  History is significant for pulmonary fibrosis. She is on oxygen at baseline at night.   Suddenly sat, felt stuffy nose with a cough.  Feels like everything is in her chest, and she will feel like she needs to cough.  Has coughed some things up.  A lot of sinus congestion and thick green mucous.   She has had a headache, but she denies any sore throat.  She does not have any nausea, vomiting, diarrhea, or other GI symptoms.  She denies any neurological symptoms.  She has not had any rash, dysuria, or other complaints.  Hand -she has some right hand aching.  This is in the MCP joints and the true wrist.  This is intermittent and right now is not really bothering her very much.  Review of Systems is noted in the HPI, as appropriate  Objective:   BP 104/70   Pulse 80   Temp 98.3 F (36.8 C) (Oral)   Ht 5\' 3"  (1.6 m)   Wt 208 lb 6 oz (94.5 kg)   SpO2 96%   BMI 36.91 kg/m    Gen: WDWN, NAD. Globally Non-toxic HEENT: Throat clear, w/o exudate, R TM clear, L TM - good landmarks, No fluid present. rhinnorhea.  MMM Frontal sinuses: NT Max sinuses: NT NECK: Anterior cervical  LAD is absent CV: RRR, No M/G/R, cap refill <2 sec PULM: Breathing comfortably in  no respiratory distress. no wheezing, crackles, rhonchi   Laboratory and Imaging Data:  Assessment and Plan:     ICD-10-CM   1. Acute bronchitis due to other specified organisms  J20.8     2. Interstitial lung disease (HCC)  J84.9     3. Acute cough  R05.1 POC COVID-19    4. Primary osteoarthritis of right hand  M19.041      Acute lower respiratory infection in a patient with interstitial lung disease.  Her lungs sound fairly normal right now, but she is a very high risk patient.  I think it is prudent to cover with antibiotics and I would have a low threshold for starting her on some oral prednisone.  Right now, I think that she can avoid any steroids.  She also has some modest osteoarthritic changes in the hand, and I expect this will flareup and bother her every so often.  She can take some Tylenol or ibuprofen if this bothers her.  Medication Management during today's office visit: Meds ordered this encounter  Medications   doxycycline (VIBRA-TABS) 100 MG tablet    Sig: Take 1 tablet (100 mg total) by mouth 2 (two) times daily.    Dispense:  20 tablet    Refill:  0  Medications Discontinued During This Encounter  Medication Reason   amoxicillin (AMOXIL) 400 MG/5ML suspension Completed Course    Orders placed today for conditions managed today: Orders Placed This Encounter  Procedures   POC COVID-19    Disposition: No follow-ups on file.  Dragon [employer] One speech-to-text software was used for transcription in this dictation.  Possible transcriptional errors can occur using Editor, commissioning.   Signed,  Maud Deed. Lilliemae Fruge, MD   Outpatient Encounter Medications as of 06/30/2022  Medication Sig   acetaminophen (TYLENOL) 500 MG tablet Take 1,000 mg by mouth every 6 (six) hours as needed. As needed   albuterol (VENTOLIN HFA) 108 (90 Base) MCG/ACT inhaler Inhale 2 puffs into the lungs every 6 (six) hours as needed for wheezing or shortness of breath.   carvedilol  (COREG) 25 MG tablet TAKE ONE TABLET BY MOUTH TWICE A DAY WITH MEALS   diazepam (VALIUM) 5 MG tablet TAKE ONE TABLET BY MOUTH EVERY 8 HOURS AS NEEDED FOR ANXIETY   doxycycline (VIBRA-TABS) 100 MG tablet Take 1 tablet (100 mg total) by mouth 2 (two) times daily.   famotidine (PEPCID) 20 MG tablet Take 1 tablet (20 mg total) by mouth daily.   spironolactone (ALDACTONE) 25 MG tablet TAKE ONE TABLET BY MOUTH DAILY   [DISCONTINUED] amoxicillin (AMOXIL) 400 MG/5ML suspension 12.5 mL po BID for 7 days   Facility-Administered Encounter Medications as of 06/30/2022  Medication   albuterol (PROVENTIL) (2.5 MG/3ML) 0.083% nebulizer solution 2.5 mg

## 2022-07-09 DIAGNOSIS — J9601 Acute respiratory failure with hypoxia: Secondary | ICD-10-CM | POA: Diagnosis not present

## 2022-07-09 DIAGNOSIS — I5189 Other ill-defined heart diseases: Secondary | ICD-10-CM | POA: Diagnosis not present

## 2022-07-17 DIAGNOSIS — Z23 Encounter for immunization: Secondary | ICD-10-CM | POA: Diagnosis not present

## 2022-07-29 ENCOUNTER — Telehealth: Payer: Self-pay | Admitting: Family Medicine

## 2022-07-29 NOTE — Telephone Encounter (Signed)
Pt called stating her father, Anne Rush (also [facility]'s pt) was taken the hosp due to falling on his face & broke his hip. The hospital stated pt may or may not make it through the surgery. Pt is very nervous is wondering can something be prescribed to calm herself down & to help with her anxiety? Call back # [phone]

## 2022-07-29 NOTE — Telephone Encounter (Signed)
I called and talked to [place].  Dad is in surgery for his hip fracture right now.  I discussed that she can take the Valium that she already has if she needs to for additional anxiety med.  I am thinking about the whole family.

## 2022-08-09 DIAGNOSIS — I5189 Other ill-defined heart diseases: Secondary | ICD-10-CM | POA: Diagnosis not present

## 2022-08-09 DIAGNOSIS — J9601 Acute respiratory failure with hypoxia: Secondary | ICD-10-CM | POA: Diagnosis not present

## 2022-08-18 NOTE — Telephone Encounter (Signed)
I am sorry to hear she is not doing well.  We have a behavioral health counselor at our office now named Harriette Ohara who is very nice and does a good job - great option.  If [facility] directly for an appointment, it will be much faster.  [phone] is their number.

## 2022-08-18 NOTE — Telephone Encounter (Signed)
Pt called stating her father passed away on 08/20/2022 & she really needs a therapist to talk to. Pt is asking can [facility] refer her to a specialist? Call back # [phone]

## 2022-08-18 NOTE — Telephone Encounter (Signed)
Juhi notified as instructed by telephone. 

## 2022-08-19 ENCOUNTER — Ambulatory Visit: Payer: BC Managed Care – PPO | Admitting: Pulmonary Disease

## 2022-08-19 ENCOUNTER — Encounter: Payer: Self-pay | Admitting: Pulmonary Disease

## 2022-08-19 VITALS — BP 130/82 | HR 77 | Temp 97.9°F | Ht 63.0 in | Wt 209.0 lb

## 2022-08-19 DIAGNOSIS — R0602 Shortness of breath: Secondary | ICD-10-CM | POA: Diagnosis not present

## 2022-08-19 DIAGNOSIS — G4730 Sleep apnea, unspecified: Secondary | ICD-10-CM | POA: Diagnosis not present

## 2022-08-19 DIAGNOSIS — J849 Interstitial pulmonary disease, unspecified: Secondary | ICD-10-CM

## 2022-08-19 DIAGNOSIS — G4734 Idiopathic sleep related nonobstructive alveolar hypoventilation: Secondary | ICD-10-CM | POA: Diagnosis not present

## 2022-08-19 LAB — NITRIC OXIDE: Nitric Oxide: 26

## 2022-08-19 NOTE — Progress Notes (Signed)
Subjective:    Patient ID: Anne Rush, female    DOB: [birth year], 61 y.o.   MRN: [id number] Patient Care Team: [name], MD as PCP - General (Family Medicine) Regan Lemming, MD as PCP - Electrophysiology (Cardiology) O'Neal, Ronnald Ramp, MD as PCP - Cardiology (Cardiology)  Chief Complaint  Patient presents with   Follow-up    SOB with stairs. No wheezing. Cough with clear sputum. Stopped using the oxygen and her levels are staying around 95%   PATIENT PROFILE This is a 61 year old female former smoker quit 2012 (30 PY) followed for RB-interstitial lung disease and obstructive sleep apnea.  Medical history significant for complete heart block status post pacemaker COVID-19 infection x2 Hx of Urosepsis 2021  Severe OSA with AHI of 45.3 and O2 sats as low as 68%, patient will not use CPAP (Home study 1/10)  HPI Patient presents for follow-up visit.  Last visit was on 10 June 2022, this was a virtual visit.  Visit was virtual due to recent exposure to COVID-19 at that time.  Recommended that she see one of our sleep providers as she has complex issues with sleep apnea that is very severe.  She continues to be reluctant to use CPAP.  She was instructed her to continue using oxygen at 2 L/min nocturnally.  Presents today stating that she is not using her oxygen at nighttime even though this was specifically recommended for her as she has severe nocturnal oxygen desaturations to 68%.  She has severe sleep apnea and refuses to even consider using CPAP.  Her BMI is too high for an inspire device.  In addition she already has a pacemaker in place which also precludes placement of this device.  She does note shortness of breath when she goes upstairs.  Does not notice any wheezing.  Has occasional cough productive of clear sputum.  She decided to stop using her oxygen stating that her oxygen levels are staying around 95% of course this is during the day and not nocturnally.  I  explained the difference to her.  She does not seem to understand the implications of severe sleep apnea.  Have been explained to her on several occasions.  She does not endorse any other symptomatology today.   Review of Systems A 10 point review of systems was performed and it is as noted above otherwise negative.  Past Medical History:  Diagnosis Date   Chronic systolic CHF (congestive heart failure) (HCC) 07/15/2020   Complete heart block (HCC) 02/26/2016   s/p Pacemaker   Family history of ovarian cancer    Generalized anxiety disorder 07/15/2020   GERD (gastroesophageal reflux disease)    History of kidney stones    Hyperlipidemia    Hypertension    Interstitial lung disease (HCC)    Pollen allergies    Presence of permanent cardiac pacemaker    Pulmonary fibrosis (HCC)    Squamous cell carcinoma 2014   skin cancer   Subclinical hyperthyroidism 06/17/2014   Past Surgical History:  Procedure Laterality Date   CESAREAN SECTION     COSMETIC SURGERY     skin cancer face   CYSTOSCOPY W/ URETERAL STENT PLACEMENT Left 07/15/2020   Procedure: CYSTOSCOPY WITH RETROGRADE PYELOGRAM/URETERAL STENT PLACEMENT;  Surgeon: Riki Altes, MD;  Location: [facility] ORS;  Service: Urology;  Laterality: Left;   CYSTOSCOPY/URETEROSCOPY/HOLMIUM LASER/STENT PLACEMENT Left 07/28/2020   Procedure: CYSTOSCOPY/URETEROSCOPY/HOLMIUM LASER/STENT Exchange;  Surgeon: Vanna Scotland, MD;  Location: [facility] ORS;  Service: Urology;  Laterality:  Left;   EP IMPLANTABLE DEVICE N/A 02/26/2016   Procedure: Pacemaker Implant;  Surgeon: Will Jorja Loa, MD;  Location: [facility];  Service: Cardiovascular;  Laterality: N/A;   TUBAL LIGATION     Patient Active Problem List   Diagnosis Date Noted   OSA (obstructive sleep apnea) 12/14/2021   Nocturnal hypoxemia 12/14/2021   GERD (gastroesophageal reflux disease) 07/15/2020   Chronic systolic CHF (congestive heart failure) (HCC) 07/15/2020   Interstitial lung  disease (HCC) 04/18/2020   Diastolic dysfunction 04/02/2020   Pacemaker 10/01/2018   Vitamin D deficiency 01/11/2018   Allergic angioedema 03/17/2016   Complete heart block (HCC) s/p Pacemaker 02/26/2016   Former tobacco use 08/28/2008   Hyperlipidemia LDL goal <100 06/21/2007   GAD (generalized anxiety disorder) 06/05/2007   Essential hypertension 06/05/2007   ALLERGIC RHINITIS 06/05/2007   Family History  Problem Relation Age of Onset   Anxiety disorder Mother    Hypertension Mother    Ovarian cancer Mother 80   Cancer Maternal Aunt        breast   Heart failure Maternal Grandfather    Social History   Tobacco Use   Smoking status: Former    Packs/day: 0.50    Years: 30.00    Total pack years: 15.00    Types: Cigarettes    Quit date: 2012    Years since quitting: 11.9   Smokeless tobacco: Never  Substance Use Topics   Alcohol use: No   Allergies  Allergen Reactions   Codeine Hives and Itching   Sulfonamide Derivatives Hives   Paroxetine Other (See Comments)    unknown   Lisinopril Rash   Losartan Potassium Other (See Comments)    Leg felt heavy   Neomycin-Polymyxin-Dexameth Itching, Swelling and Other (See Comments)    Ey drop/Swelling and itching of eye    Oxycodone-Acetaminophen Other (See Comments)    Mild SOB, feels funny   Sulfa Antibiotics Hives and Rash        Current Meds  Medication Sig   acetaminophen (TYLENOL) 500 MG tablet Take 1,000 mg by mouth every 6 (six) hours as needed. As needed   albuterol (VENTOLIN HFA) 108 (90 Base) MCG/ACT inhaler Inhale 2 puffs into the lungs every 6 (six) hours as needed for wheezing or shortness of breath.   carvedilol (COREG) 25 MG tablet TAKE ONE TABLET BY MOUTH TWICE A DAY WITH MEALS   diazepam (VALIUM) 5 MG tablet TAKE ONE TABLET BY MOUTH EVERY 8 HOURS AS NEEDED FOR ANXIETY   famotidine (PEPCID) 20 MG tablet Take 1 tablet (20 mg total) by mouth daily.   spironolactone (ALDACTONE) 25 MG tablet TAKE ONE TABLET  BY MOUTH DAILY   Immunization History  Administered Date(s) Administered   Influenza Nasal 06/20/2017   Influenza Whole 07/04/2008, 06/18/2009, 08/27/2010   Influenza, Seasonal, Injecte, Preservative Fre 09/03/2010, 06/22/2011, 06/27/2012   Influenza,inj,Quad PF,6+ Mos 07/21/2015, 07/06/2018, 08/10/2021   Influenza-Unspecified 06/27/2012, 07/17/2014, 07/19/2016, 07/21/2019, 07/17/2022   Td 05/10/2002   Tdap 10/28/2014       Objective:   Physical Exam BP 130/82 (BP Location: Left Arm, Cuff Size: Normal)   Pulse 77   Temp 97.9 F (36.6 C)   Ht 5\' 3"  (1.6 m)   Wt 209 lb (94.8 kg)   SpO2 94%   BMI 37.02 kg/m   SpO2: 94 % O2 Device: None (Room air)  GENERAL: Obese woman, no acute distress.  Appears fatigued.  Fully ambulatory.  No conversational dyspnea.  HEAD: Normocephalic,  atraumatic.  EYES: Pupils equal, round, reactive to light.  No scleral icterus.  Mild periorbital edema/puffiness. MOUTH: Poor dentition, Mallampati III airway. NECK: Supple. No thyromegaly. Trachea midline. No JVD.  No adenopathy. PULMONARY: Good air entry bilaterally.  No adventitious sounds.  Overt crackles. CARDIOVASCULAR: S1 and S2. Regular rate and rhythm.  Pacer dependent.  No murmurs. ABDOMEN: Obese otherwise benign. MUSCULOSKELETAL: No joint deformity,+ clubbing (mild), no edema.  NEUROLOGIC: No focal deficit, no gait disturbance, speech is fluent. SKIN: Intact,warm,dry. PSYCH: Mood and behavior normal.   Lab Results  Component Value Date   NITRICOXIDE 27 12/15/2022      Assessment & Plan:     ICD-10-CM   1. Interstitial lung disease (HCC)  J84.9 CANCELED: Pulmonary Function Test [facility] Only   Previously noted to be RB ILD No overt evidence of progression Reassess with PFTs    2. SOB (shortness of breath)  R06.02 Nitric oxide    CANCELED: Pulmonary Function Test [facility] Only   Multifactorial, reassess with PFTs Mostly expressed as fatigue Nonrestorative sleep may be playing a  part Deconditioning also possibility    3. Nocturnal hypoxemia  G47.34    USE supplemental O2 prescribed Patient noncompliant    4. Severe sleep apnea - SEVERE AHI 45.3, O2 sats 68%  G47.30    Patient refuses to use CPAP Not a candidate for inspire device     Orders Placed This Encounter  Procedures   Nitric oxide   Pulmonary Function Test [facility] Only    Standing Status:   Future    Standing Expiration Date:   02/17/2023    Order Specific Question:   Full PFT: includes the following: basic spirometry, spirometry pre & post bronchodilator, diffusion capacity (DLCO), lung volumes    Answer:   Full PFT    Order Specific Question:   This test can only be performed at    Answer:   [facility]   Patient was urged to continue using oxygen nocturnally at 2 L/min.  We will reassess with PFTs.  Will see her in follow-up in 3 to 4 months time she is to call sooner should any new problems arise.  Gailen Shelter, MD Advanced Bronchoscopy PCCM [facility]    *This note was dictated using voice recognition software/Dragon.  Despite best efforts to proofread, errors can occur which can change the meaning. Any transcriptional errors that result from this process are unintentional and may not be fully corrected at the time of dictation.

## 2022-08-19 NOTE — Progress Notes (Deleted)
   Subjective:    Patient ID: Anne Rush, female    DOB: 08/23/1961, 61 y.o.   MRN: [id number]  HPI    Review of Systems     Objective:   Physical Exam        Assessment & Plan:

## 2022-08-19 NOTE — Patient Instructions (Signed)
We are going to order a breathing test prior to follow-up here.  Please continue using your oxygen at 2 L/min at nighttime.  We will see you in follow-up in 3 to 4 months time call sooner should any new problems arise.

## 2022-08-23 ENCOUNTER — Ambulatory Visit (INDEPENDENT_AMBULATORY_CARE_PROVIDER_SITE_OTHER): Payer: BC Managed Care – PPO | Admitting: Psychology

## 2022-08-23 ENCOUNTER — Encounter: Payer: Self-pay | Admitting: Psychology

## 2022-08-23 DIAGNOSIS — F411 Generalized anxiety disorder: Secondary | ICD-10-CM

## 2022-08-23 DIAGNOSIS — Z634 Disappearance and death of family member: Secondary | ICD-10-CM

## 2022-08-23 DIAGNOSIS — F4320 Adjustment disorder, unspecified: Secondary | ICD-10-CM

## 2022-08-23 DIAGNOSIS — F32A Depression, unspecified: Secondary | ICD-10-CM
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
## 2022-08-23 NOTE — Progress Notes (Signed)
? ? ? ? ? ? ? ? ? ? ? ? ? ? ?  Anne Rush, LCMHC ?

## 2022-08-31 ENCOUNTER — Ambulatory Visit (INDEPENDENT_AMBULATORY_CARE_PROVIDER_SITE_OTHER): Payer: BC Managed Care – PPO | Admitting: Psychology

## 2022-08-31 DIAGNOSIS — Z634 Disappearance and death of family member: Secondary | ICD-10-CM

## 2022-08-31 DIAGNOSIS — F4320 Adjustment disorder, unspecified: Secondary | ICD-10-CM | POA: Diagnosis not present

## 2022-08-31 DIAGNOSIS — F411 Generalized anxiety disorder: Secondary | ICD-10-CM | POA: Diagnosis not present

## 2022-08-31 DIAGNOSIS — F32A Depression, unspecified: Secondary | ICD-10-CM

## 2022-08-31 NOTE — Progress Notes (Signed)
[facility] Counselor/Therapist Progress Note  Patient ID: Anne Rush, MRN: [id number],    Date: 08/31/2022  Time Spent: 4:30pm-5:29pm  pt is seen for a virtual video visit via caregility.  Pt joins from her home and counselor from her home office.    Treatment Type: Individual Therapy  Reported Symptoms: tearfulness, feeling down, loss of interest, feeling on edge.    Mental Status Exam: Appearance:  Fairly Groomed     Behavior: Appropriate  Motor: Normal  Speech/Language:  Normal Rate  Affect: Depressed and Tearful  Mood: anxious and depressed  Thought process: normal  Thought content:   WNL  Sensory/Perceptual disturbances:   WNL  Orientation: oriented to person, place, time/date, and situation  Attention: Good  Concentration: Good  Memory: WNL  Fund of knowledge:  Good  Insight:   Good  Judgment:  Good  Impulse Control: Good   Risk Assessment: Danger to Self:  No Self-injurious Behavior: No Danger to Others: No Duty to Warn:no Physical Aggression / Violence:No  Access to Firearms a concern: No  Gang Involvement:No   Subjective: Assessed pt current functioning per pt report. Had pt identify goals for counseling and developed tx plan together. Processed w/pt depressed emotions and anxiety.  Validated and normalized grief response and okay to be grieving.  Discussed interactions w/ mom and boundaries she is considering.  Pt affect congruent w/ depressed mood. Pt reported difficult days w/ feeling down, wanting to stay in bed and being very tearful.  Pt reported on confronting mom about and mom becoming very angry, yelling and hanging up on her.  Pt discussed not liking being in conflict and mom hasn't called in 4 days.  Pt discussed want to not be berated by mom but also not wanting to feel hurt by mom cutting her off.  Pt discussed wanting safe space to process through loss of dad and everything it is bring up.    Interventions: Cognitive Behavioral  Therapy and supportive  Diagnosis:GAD (generalized anxiety disorder)  Bereavement reaction  Depression, unspecified depression type  Plan: Pt to f/u w/ counseling in 1 week to assist coping.  Pt to f/u as scheduled w/her PCP and specialists.     Individualized Treatment Plan Strengths: seeking counseling, supportive relationships  Supports: husband, daughter, best friend in [place] and Hilda Lias, family friend.   Goal/Needs for Treatment:  In order of importance to patient 1) cope with the loss of her father  2) decrease loss of interest, enjoy things again life 3) manage through stressful interactions w/ mom   Client Statement of Needs: pt "I am hoping that it will help me talk to someone on the outside and get over this. Get back to life, not forget my dad, but not all weepy and distraught. "   Treatment Level:outpatient counseling  Symptoms:depressed mood, anxious, loss of interest, grief  Client Treatment Preferences: counseling every 1-2 weeks.     Healthcare consumer's goal for treatment:  Counselor, Forde Radon, [facility] will support the patient's ability to achieve the goals identified. Cognitive Behavioral Therapy, Assertive Communication/Conflict Resolution Training, Relaxation Training, ACT, Humanistic and other evidenced-based practices will be used to promote progress towards healthy functioning.   Healthcare consumer will: Actively participate in therapy, working towards healthy functioning.    *Justification for Continuation/Discontinuation of Goal: R=Revised, O=Ongoing, A=Achieved, D=Discontinued  Goal 1) Pt to begin to verbalize emotions and thoughts re: her grief AEB pt report and therapist observation.   Baseline date 08/31/22: Progress towards goal 0; How  Often - Daily Target Date Goal Was reviewed Status Code Progress towards goal/Likert rating  09/12/23                Goal 2) Decrease loss of interest and engage in things she enjoys individually and w/  others AEB pt report and therapist observation.   Baseline date 08/31/22: Progress towards goal 0; How Often - Daily Target Date Goal Was reviewed Status Code Progress towards goal  09/01/23                Goal 3) Increase awareness of impact of interactions w/ mom and awareness of healthy boundaries she can set AEB pt report and counselor observation.   Baseline date 08/31/22: Progress towards goal 0; How Often - Daily Target Date Goal Was reviewed Status Code Progress towards goal  09/01/23                This plan has been reviewed and created by the following participants:  This plan will be reviewed at least every 12 months. Date Behavioral Health Clinician Date Guardian/Patient   08/31/22  [facility] Ophelia Charter [facility] 08/31/22 Verbal Consent Provided                    Forde Radon [facility]

## 2022-08-31 NOTE — Progress Notes (Signed)
? ? ? ? ? ? ? ? ? ? ? ? ? ? ?  Marianna Cid, LCMHC ?

## 2022-09-01 ENCOUNTER — Encounter: Payer: Self-pay | Admitting: Family Medicine

## 2022-09-01 ENCOUNTER — Ambulatory Visit: Payer: BC Managed Care – PPO | Admitting: Family Medicine

## 2022-09-01 VITALS — BP 112/80 | HR 88 | Temp 98.3°F | Ht 63.0 in | Wt 211.0 lb

## 2022-09-01 DIAGNOSIS — J208 Acute bronchitis due to other specified organisms: Secondary | ICD-10-CM | POA: Diagnosis not present

## 2022-09-01 DIAGNOSIS — J849 Interstitial pulmonary disease, unspecified: Secondary | ICD-10-CM

## 2022-09-01 DIAGNOSIS — R051 Acute cough: Secondary | ICD-10-CM

## 2022-09-01 LAB — POC COVID19 BINAXNOW: SARS Coronavirus 2 Ag: NEGATIVE

## 2022-09-01 MED ORDER — DOXYCYCLINE HYCLATE 100 MG PO TABS: 100.0000 mg | ORAL_TABLET | Freq: Two times a day (BID) | ORAL | 0 refills | Status: DC

## 2022-09-01 NOTE — Progress Notes (Signed)
[name], MD, CAQ Sports Medicine [facility] [address] [place], [address]  Phone: [phone]  FAX: [phone]  Anne Rush - 61 y.o. female  MRN [id number]  Date of Birth: November 03, 1960  Date: 09/01/2022  PCP: [name], MD  Referral: [name], MD  Chief Complaint  Patient presents with   Sore Throat    Symptoms started this morning   Ear Pain    Left   Cough   Subjective:   Anne Rush is a 61 y.o. very pleasant female patient with Body mass index is 37.38 kg/m. who presents with the following:  Woke up this morning, sore when she swallow and a scratchy throat.  Husband has been sick for about 4 days.    She does have interstitial lung disease, she presents today with roughly 24 hours of sore throat, earache as well as coughing.  She does have some nasal congestion, as well.  She denies any global polyarthralgia or myalgia.  She has no body aches or systemic chills.  Her husband is currently sick, and he has been sick for about 4 days.  Review of Systems is noted in the HPI, as appropriate  Objective:   BP 112/80   Pulse 88   Temp 98.3 F (36.8 C) (Oral)   Ht 5\' 3"  (1.6 m)   Wt 211 lb (95.7 kg)   SpO2 93%   BMI 37.38 kg/m    Gen: WDWN, NAD. Globally Non-toxic HEENT: Throat clear, w/o exudate, R TM clear, L TM - good landmarks, No fluid present. rhinnorhea.  MMM Frontal sinuses: NT Max sinuses: NT NECK: Anterior cervical  LAD is absent CV: RRR, No M/G/R, cap refill <2 sec PULM: Breathing comfortably in no respiratory distress. no wheezing, crackles, rhonchi   Laboratory and Imaging Data: Results for orders placed or performed in visit on 09/01/22  POC COVID-19  Result Value Ref Range   SARS Coronavirus 2 Ag Negative Negative     Assessment and Plan:     ICD-10-CM   1. Acute bronchitis due to other specified organisms  J20.8     2. Acute cough  R05.1 POC COVID-19    3.  Interstitial lung disease (HCC)  J84.9      Patient does have interstitial lung disease, and unclear if this is a viral versus bacterial process.  Given her high risk status, and going to place her on some doxycycline.  She will also recheck her COVID test in 2 days time, this is positive I will need to place her on antivirals.  Lungs right now sound fairly clear, so I will avoid any kind of steroids and less she worsens.  Did my best to provide some grief counseling about her father's death.  Medication Management during today's office visit: Meds ordered this encounter  Medications   doxycycline (VIBRA-TABS) 100 MG tablet    Sig: Take 1 tablet (100 mg total) by mouth 2 (two) times daily.    Dispense:  20 tablet    Refill:  0   Medications Discontinued During This Encounter  Medication Reason   doxycycline (VIBRA-TABS) 100 MG tablet Completed Course    Orders placed today for conditions managed today: Orders Placed This Encounter  Procedures   POC COVID-19    Disposition: No follow-ups on file.  Dr[employer] speech-to-text software was used for transcription in this dictation.  Possible transcriptional errors can occur using 09/03/22.  Signed,  Elpidio Galea. Samuella Rasool, MD   Outpatient Encounter Medications as of 09/01/2022  Medication Sig   acetaminophen (TYLENOL) 500 MG tablet Take 1,000 mg by mouth every 6 (six) hours as needed. As needed   albuterol (VENTOLIN HFA) 108 (90 Base) MCG/ACT inhaler Inhale 2 puffs into the lungs every 6 (six) hours as needed for wheezing or shortness of breath.   carvedilol (COREG) 25 MG tablet TAKE ONE TABLET BY MOUTH TWICE A DAY WITH MEALS   diazepam (VALIUM) 5 MG tablet TAKE ONE TABLET BY MOUTH EVERY 8 HOURS AS NEEDED FOR ANXIETY   doxycycline (VIBRA-TABS) 100 MG tablet Take 1 tablet (100 mg total) by mouth 2 (two) times daily.   famotidine (PEPCID) 20 MG tablet Take 1 tablet (20 mg total) by mouth daily.   spironolactone  (ALDACTONE) 25 MG tablet TAKE ONE TABLET BY MOUTH DAILY   [DISCONTINUED] doxycycline (VIBRA-TABS) 100 MG tablet Take 1 tablet (100 mg total) by mouth 2 (two) times daily. (Patient not taking: Reported on 08/19/2022)   Facility-Administered Encounter Medications as of 09/01/2022  Medication   albuterol (PROVENTIL) (2.5 MG/3ML) 0.083% nebulizer solution 2.5 mg

## 2022-09-02 ENCOUNTER — Observation Stay
Admission: EM | Admit: 2022-09-02 | Discharge: 2022-09-03 | Disposition: A | Discharge status: Home / Self Care | Payer: BC Managed Care – PPO | Attending: Internal Medicine | Admitting: Internal Medicine

## 2022-09-02 ENCOUNTER — Other Ambulatory Visit: Payer: Self-pay

## 2022-09-02 ENCOUNTER — Emergency Department: Payer: BC Managed Care – PPO

## 2022-09-02 DIAGNOSIS — K219 Gastro-esophageal reflux disease without esophagitis: Secondary | ICD-10-CM | POA: Diagnosis not present

## 2022-09-02 DIAGNOSIS — F419 Anxiety disorder, unspecified: Secondary | ICD-10-CM | POA: Diagnosis not present

## 2022-09-02 DIAGNOSIS — F411 Generalized anxiety disorder: Secondary | ICD-10-CM | POA: Diagnosis present

## 2022-09-02 DIAGNOSIS — Z883 Allergy status to other anti-infective agents status: Secondary | ICD-10-CM | POA: Diagnosis not present

## 2022-09-02 DIAGNOSIS — I459 Conduction disorder, unspecified: Secondary | ICD-10-CM | POA: Diagnosis present

## 2022-09-02 DIAGNOSIS — I442 Atrioventricular block, complete: Secondary | ICD-10-CM | POA: Diagnosis present

## 2022-09-02 DIAGNOSIS — Z87891 Personal history of nicotine dependence: Secondary | ICD-10-CM | POA: Diagnosis not present

## 2022-09-02 DIAGNOSIS — Z803 Family history of malignant neoplasm of breast: Secondary | ICD-10-CM | POA: Diagnosis not present

## 2022-09-02 DIAGNOSIS — Z818 Family history of other mental and behavioral disorders: Secondary | ICD-10-CM

## 2022-09-02 DIAGNOSIS — Z8041 Family history of malignant neoplasm of ovary: Secondary | ICD-10-CM | POA: Diagnosis not present

## 2022-09-02 DIAGNOSIS — Z85828 Personal history of other malignant neoplasm of skin: Secondary | ICD-10-CM

## 2022-09-02 DIAGNOSIS — R000 Tachycardia, unspecified: Secondary | ICD-10-CM | POA: Diagnosis not present

## 2022-09-02 DIAGNOSIS — I509 Heart failure, unspecified: Secondary | ICD-10-CM | POA: Diagnosis not present

## 2022-09-02 DIAGNOSIS — Z98891 History of uterine scar from previous surgery: Secondary | ICD-10-CM

## 2022-09-02 DIAGNOSIS — Z882 Allergy status to sulfonamides status: Secondary | ICD-10-CM

## 2022-09-02 DIAGNOSIS — J8410 Pulmonary fibrosis, unspecified: Secondary | ICD-10-CM | POA: Diagnosis present

## 2022-09-02 DIAGNOSIS — J849 Interstitial pulmonary disease, unspecified: Secondary | ICD-10-CM | POA: Diagnosis present

## 2022-09-02 DIAGNOSIS — E0590 Thyrotoxicosis, unspecified without thyrotoxic crisis or storm: Secondary | ICD-10-CM | POA: Diagnosis present

## 2022-09-02 DIAGNOSIS — E785 Hyperlipidemia, unspecified: Secondary | ICD-10-CM | POA: Diagnosis present

## 2022-09-02 DIAGNOSIS — Z950 Presence of cardiac pacemaker: Secondary | ICD-10-CM | POA: Diagnosis not present

## 2022-09-02 DIAGNOSIS — I110 Hypertensive heart disease with heart failure: Secondary | ICD-10-CM | POA: Diagnosis not present

## 2022-09-02 DIAGNOSIS — I5022 Chronic systolic (congestive) heart failure: Secondary | ICD-10-CM | POA: Diagnosis present

## 2022-09-02 DIAGNOSIS — A4189 Other specified sepsis: Secondary | ICD-10-CM | POA: Diagnosis present

## 2022-09-02 DIAGNOSIS — U071 COVID-19: Secondary | ICD-10-CM | POA: Diagnosis not present

## 2022-09-02 DIAGNOSIS — Z8249 Family history of ischemic heart disease and other diseases of the circulatory system: Secondary | ICD-10-CM

## 2022-09-02 DIAGNOSIS — Z87442 Personal history of urinary calculi: Secondary | ICD-10-CM | POA: Diagnosis not present

## 2022-09-02 DIAGNOSIS — G9340 Encephalopathy, unspecified: Secondary | ICD-10-CM | POA: Diagnosis not present

## 2022-09-02 DIAGNOSIS — G9349 Other encephalopathy: Secondary | ICD-10-CM | POA: Diagnosis present

## 2022-09-02 DIAGNOSIS — R4182 Altered mental status, unspecified: Secondary | ICD-10-CM | POA: Diagnosis not present

## 2022-09-02 DIAGNOSIS — Z885 Allergy status to narcotic agent status: Secondary | ICD-10-CM

## 2022-09-02 DIAGNOSIS — Z9851 Tubal ligation status: Secondary | ICD-10-CM | POA: Diagnosis not present

## 2022-09-02 DIAGNOSIS — Z888 Allergy status to other drugs, medicaments and biological substances status: Secondary | ICD-10-CM

## 2022-09-02 DIAGNOSIS — R0602 Shortness of breath: Secondary | ICD-10-CM | POA: Diagnosis not present

## 2022-09-02 DIAGNOSIS — I10 Essential (primary) hypertension: Secondary | ICD-10-CM | POA: Diagnosis present

## 2022-09-02 DIAGNOSIS — Z79899 Other long term (current) drug therapy: Secondary | ICD-10-CM

## 2022-09-02 LAB — CBC
HCT: 45.3 % (ref 36.0–46.0)
Hemoglobin: 15.6 g/dL — ABNORMAL HIGH (ref 12.0–15.0)
MCH: 30.9 pg (ref 26.0–34.0)
MCHC: 34.4 g/dL (ref 30.0–36.0)
MCV: 89.7 fL (ref 80.0–100.0)
Platelets: 213 10*3/uL (ref 150–400)
RBC: 5.05 MIL/uL (ref 3.87–5.11)
RDW: 11.9 % (ref 11.5–15.5)
WBC: 9.3 10*3/uL (ref 4.0–10.5)
nRBC: 0 % (ref 0.0–0.2)

## 2022-09-02 LAB — LACTIC ACID, PLASMA
Lactic Acid, Venous: 1.9 mmol/L (ref 0.5–1.9)
Lactic Acid, Venous: 1.9 mmol/L (ref 0.5–1.9)

## 2022-09-02 LAB — COMPREHENSIVE METABOLIC PANEL
ALT: 19 U/L (ref 0–44)
AST: 25 U/L (ref 15–41)
Albumin: 4.6 g/dL (ref 3.5–5.0)
Alkaline Phosphatase: 54 U/L (ref 38–126)
Anion gap: 8 (ref 5–15)
BUN: 8 mg/dL (ref 6–20)
CO2: 28 mmol/L (ref 22–32)
Calcium: 9.7 mg/dL (ref 8.9–10.3)
Chloride: 100 mmol/L (ref 98–111)
Creatinine, Ser: 0.63 mg/dL (ref 0.44–1.00)
GFR, Estimated: >60 mL/min (ref >60)
Glucose, Bld: 106 mg/dL — ABNORMAL HIGH (ref 70–99)
Potassium: 3.9 mmol/L (ref 3.5–5.1)
Sodium: 136 mmol/L (ref 135–145)
Total Bilirubin: 1.3 mg/dL — ABNORMAL HIGH (ref 0.3–1.2)
Total Protein: 8.5 g/dL — ABNORMAL HIGH (ref 6.5–8.1)

## 2022-09-02 LAB — URINALYSIS, ROUTINE W REFLEX MICROSCOPIC
Bacteria, UA: NONE SEEN
Bilirubin Urine: NEGATIVE
Glucose, UA: NEGATIVE mg/dL
Ketones, ur: 5 mg/dL — AB
Nitrite: NEGATIVE
Protein, ur: NEGATIVE mg/dL
Specific Gravity, Urine: 1.015 (ref 1.005–1.030)
pH: 7 (ref 5.0–8.0)

## 2022-09-02 LAB — RESP PANEL BY RT-PCR (RSV, FLU A&B, COVID)  RVPGX2
Influenza A by PCR: NEGATIVE
Influenza B by PCR: NEGATIVE
Resp Syncytial Virus by PCR: NEGATIVE
SARS Coronavirus 2 by RT PCR: POSITIVE — AB

## 2022-09-02 LAB — TROPONIN I (HIGH SENSITIVITY): Troponin I (High Sensitivity): 6 ng/L (ref <18)

## 2022-09-02 LAB — PROTIME-INR
INR: 1.1 (ref 0.8–1.2)
Prothrombin Time: 13.7 seconds (ref 11.4–15.2)

## 2022-09-02 LAB — POC URINE PREG, ED: Preg Test, Ur: NEGATIVE

## 2022-09-02 MED ORDER — ZINC SULFATE 220 (50 ZN) MG PO CAPS
220.0000 mg | ORAL_CAPSULE | Freq: Every day | ORAL | Status: DC
  Administered 2022-09-03: 220 mg via ORAL
  Filled 2022-09-02: qty 1

## 2022-09-02 MED ORDER — SPIRONOLACTONE 25 MG PO TABS
25.0000 mg | ORAL_TABLET | Freq: Every day | ORAL | Status: DC
  Administered 2022-09-03: 25 mg via ORAL
  Filled 2022-09-02: qty 1

## 2022-09-02 MED ORDER — CARVEDILOL 25 MG PO TABS
25.0000 mg | ORAL_TABLET | Freq: Two times a day (BID) | ORAL | Status: DC
  Administered 2022-09-03: 25 mg via ORAL
  Filled 2022-09-02: qty 1

## 2022-09-02 MED ORDER — VITAMIN C 500 MG PO TABS
500.0000 mg | ORAL_TABLET | Freq: Every day | ORAL | Status: DC
  Administered 2022-09-03: 500 mg via ORAL
  Filled 2022-09-02: qty 1

## 2022-09-02 MED ORDER — ENOXAPARIN SODIUM 60 MG/0.6ML IJ SOSY
0.5000 mg/kg | PREFILLED_SYRINGE | INTRAMUSCULAR | Status: DC
  Administered 2022-09-03: 47.5 mg via SUBCUTANEOUS
  Filled 2022-09-02: qty 0.6

## 2022-09-02 MED ORDER — NIRMATRELVIR/RITONAVIR (PAXLOVID)TABLET
3.0000 | ORAL_TABLET | Freq: Two times a day (BID) | ORAL | Status: DC
  Administered 2022-09-02 – 2022-09-03 (×2): 3 via ORAL
  Filled 2022-09-02: qty 30

## 2022-09-02 MED ORDER — SODIUM CHLORIDE 0.9 % IV SOLN: INTRAVENOUS | Status: DC

## 2022-09-02 MED ORDER — DIAZEPAM 5 MG PO TABS
5.0000 mg | ORAL_TABLET | Freq: Three times a day (TID) | ORAL | Status: DC | PRN
  Administered 2022-09-03: 5 mg via ORAL
  Filled 2022-09-02: qty 1

## 2022-09-02 MED ORDER — GUAIFENESIN-DM 100-10 MG/5ML PO SYRP: 10.0000 mL | ORAL_SOLUTION | ORAL | Status: DC | PRN

## 2022-09-02 MED ORDER — METHYLPREDNISOLONE SODIUM SUCC 125 MG IJ SOLR
1.0000 mg/kg | Freq: Two times a day (BID) | INTRAMUSCULAR | Status: DC
  Administered 2022-09-03: 95.625 mg via INTRAVENOUS
  Filled 2022-09-02: qty 2

## 2022-09-02 MED ORDER — SODIUM CHLORIDE 0.9 % IV BOLUS
1000.0000 mL | Freq: Once | INTRAVENOUS | Status: AC
  Administered 2022-09-02: 1000 mL via INTRAVENOUS

## 2022-09-02 MED ORDER — TRAZODONE HCL 50 MG PO TABS: 25.0000 mg | ORAL_TABLET | Freq: Every evening | ORAL | Status: DC | PRN

## 2022-09-02 MED ORDER — ONDANSETRON HCL 4 MG/2ML IJ SOLN: 4.0000 mg | Freq: Four times a day (QID) | INTRAMUSCULAR | Status: DC | PRN

## 2022-09-02 MED ORDER — NIRMATRELVIR/RITONAVIR (PAXLOVID)TABLET: 3.0000 | ORAL_TABLET | Freq: Two times a day (BID) | ORAL | Status: DC

## 2022-09-02 MED ORDER — FAMOTIDINE 20 MG PO TABS
20.0000 mg | ORAL_TABLET | Freq: Every day | ORAL | Status: DC
  Administered 2022-09-03: 20 mg via ORAL
  Filled 2022-09-02: qty 1

## 2022-09-02 MED ORDER — HYDROCOD POLI-CHLORPHE POLI ER 10-8 MG/5ML PO SUER: 5.0000 mL | Freq: Two times a day (BID) | ORAL | Status: DC | PRN

## 2022-09-02 MED ORDER — ONDANSETRON HCL 4 MG PO TABS: 4.0000 mg | ORAL_TABLET | Freq: Four times a day (QID) | ORAL | Status: DC | PRN

## 2022-09-02 MED ORDER — ASPIRIN 81 MG PO TBEC
81.0000 mg | DELAYED_RELEASE_TABLET | Freq: Every day | ORAL | Status: DC
  Administered 2022-09-03: 81 mg via ORAL
  Filled 2022-09-02: qty 1

## 2022-09-02 MED ORDER — ALBUTEROL SULFATE HFA 108 (90 BASE) MCG/ACT IN AERS: 2.0000 | INHALATION_SPRAY | Freq: Four times a day (QID) | RESPIRATORY_TRACT | Status: DC | PRN

## 2022-09-02 MED ORDER — PREDNISONE 50 MG PO TABS: 50.0000 mg | ORAL_TABLET | Freq: Every day | ORAL | Status: DC

## 2022-09-02 MED ORDER — MAGNESIUM HYDROXIDE 400 MG/5ML PO SUSP: 30.0000 mL | Freq: Every day | ORAL | Status: DC | PRN

## 2022-09-02 NOTE — Assessment & Plan Note (Signed)
-   This is clearly secondary to her COVID-19     - Management as above. - We will monitor mental status. - Her noncontrasted head CT scan came back negative.

## 2022-09-02 NOTE — ED Provider Notes (Signed)
[facility] Provider Note    Event Date/Time   First MD Initiated Contact with Patient 09/02/22 1836     (approximate)   History   Altered Mental Status   HPI  Anne Rush is a 61 y.o. female  who presents to the emergency department today accompanied by family because of concern for altered mental status.  Patient herself is unable to give any significant history.  History is obtained from family at bedside.  Patient went to primary care yesterday because of concerns for upper respiratory infection type symptoms.  Patient had having some nasal congestion and sore throat.  Was tested for COVID which was negative and was started on doxycycline.  Patient took 2 doses yesterday and 1 this morning.  This afternoon patient started to act more confused.  She started having hallucinations.  Family gives example of her thinking that people were in her hospital and they were not.  Apparently the patient had similar confusion once in the past when septic from an infection.  Patient denies any urinary tract infection type symptoms.     Physical Exam   Triage Vital Signs: ED Triage Vitals [09/02/22 1822]  Enc Vitals Group     BP (!) 185/105     Pulse Rate (!) 130     Resp 18     Temp 97.8 F (36.6 C)     Temp Source Oral     SpO2 96 %     Weight      Height      Head Circumference      Peak Flow      Pain Score      Pain Loc      Pain Edu?      Excl. in GC?     Most recent vital signs: Vitals:   09/02/22 1822  BP: (!) 185/105  Pulse: (!) 130  Resp: 18  Temp: 97.8 F (36.6 C)  SpO2: 96%    General: Awake, alert, confused and not completely oriented. CV:  Good peripheral perfusion. Tachycardia, regular rhythm. Resp:  Normal effort. Lungs clear. Abd:  No distention.    ED Results / Procedures / Treatments   Labs (all labs ordered are listed, but only abnormal results are displayed) Labs Reviewed  CBC - Abnormal; Notable for the following  components:      Result Value   Hemoglobin 15.6 (*)    All other components within normal limits  CULTURE, BLOOD (ROUTINE X 2)  CULTURE, BLOOD (ROUTINE X 2)  COMPREHENSIVE METABOLIC PANEL  LACTIC ACID, PLASMA  LACTIC ACID, PLASMA  PROTIME-INR  POC URINE PREG, ED     EKG  I, Phineas Semen, attending physician, personally viewed and interpreted this EKG  EKG Time: 1842 Rate: 114 Rhythm: sinus tachycardia Axis: left axis deviation Intervals: qtc 484 QRS: LBBB ST changes: no st elevation Impression: abnormal ekg   RADIOLOGY I independently interpreted and visualized the CXR. My interpretation: No pneumonia Radiology interpretation:  IMPRESSION:  No active disease.   I independently interpreted and visualized the CT head. My interpretation: No acute bleed. Radiology interpretation:  IMPRESSION:  No acute intracranial abnormalities.          PROCEDURES:  Critical Care performed: No  Procedures   MEDICATIONS ORDERED IN ED: Medications - No data to display   IMPRESSION / MDM / ASSESSMENT AND PLAN / ED COURSE  I reviewed the triage vital signs and the nursing notes.  Differential diagnosis includes, but is not limited to, intracranial process, infection, electrolyte abnormality.  Patient's presentation is most consistent with acute presentation with potential threat to life or bodily function.  Patient presented to the emergency department today because of concerns for altered mental status.  On exam patient is confused.  No leukocytosis or elevated lactic acid level to suggest sepsis.  Patient did come back positive for COVID.  I do think at this time COVID encephalopathy likely.  Will start patient on steroids and antivirals.  Discussed with Dr. Burley Saver with the hospitalist service with plan on admission.  FINAL CLINICAL IMPRESSION(S) / ED DIAGNOSES   Final diagnoses:  COVID-19     Note:  This document was prepared using  Dragon voice recognition software and may include unintentional dictation errors.    Phineas Semen, MD 09/02/22 2248

## 2022-09-02 NOTE — ED Triage Notes (Addendum)
Pt was placed on doxycycline for an URI yesterday- pt woke up this AM not feeling well- pt also now has confusion with hallucinations- pt has had sepsis before- pt was tested for covid at her PCP and was negative but was not tested for flu or RSV- pt is unable to remember her last name or the year of her birth date

## 2022-09-02 NOTE — H&P (Signed)
[place]   PATIENT NAME: Anne Rush    MR#:  [id number]  DATE OF BIRTH:  [birth year]  DATE OF ADMISSION:  09/02/2022  PRIMARY CARE PHYSICIAN: Owens Loffler, MD   Patient is coming from: Home  REQUESTING/REFERRING PHYSICIAN: Nance Pear, MD  CHIEF COMPLAINT:   Chief Complaint  Patient presents with   Altered Mental Status    HISTORY OF PRESENT ILLNESS:  Anne Rush is a 61 y.o. Caucasian female with medical history significant for chronic systolic CHF, s/p permanent pacemaker placement for complete heart block, generalized anxiety disorder, GERD, hypertension, dyslipidemia, interstitial lung disease and IPF, who presented to the emergency room with a [place] of URI symptoms with sore throat and rhinorrhea as well as chest congestion and cough productive of clear sputum without dyspnea or wheezing for the last couple of days.  She admits to aching all over and left leg cramps.  She has been having altered mental status with confusion and hallucinations including auditory and visual hallucinations per her husband.  She denies any loss of taste or smell.  She has never been vaccinated for COVID-19 but had her influenza vaccine this year.  She denied any chest pain or palpitations.  No nausea or vomiting or diarrhea.  She admits to low-grade fever with a temperature of 99.8 without chills.  She was seen by her PCP yesterday and had a negative rapid COVID 19 antigen test.  No dysuria, oliguria or hematuria, urgency or frequency or flank pain.  No headache or dizziness or blurred vision.  ED Course: When she came to the ER, heart rate was 130 and respiratory rate 18 with blood pressure 185/105, temperature 97.8 and pulse oximetry 96% on room air.  Labs revealed a total bili of 1.3, total protein of 8.5 glucose of 106 with unremarkable CMP.  CBC was within normal with mild hemoconcentration.  Lactic acid was 1.9.  PT and INR were normal. COVID-19 PCR came back positive.   Influenza antigens and RSV PCR came back negative.  Her EKG as reviewed by me : EKG showed paced rhythm with a rate of 14 with nonspecific intraventricular conduction delay Imaging: Portable chest ray showed no acute cardiopulmonary disease. Non-contrasted head CT scan revealed no acute intracranial abnormalities.  The patient was given 1 L bolus of IV normal saline was started on p.o. Paxlovid and ordered IV Solu-Medrol.  She will be admitted to a medical telemetry bed for further evaluation and management. PAST MEDICAL HISTORY:   Past Medical History:  Diagnosis Date   Chronic systolic CHF (congestive heart failure) ([place]) 07/15/2020   Complete heart block ([facility]) 02/26/2016   s/p Pacemaker   Family history of ovarian cancer    Generalized anxiety disorder 07/15/2020   GERD (gastroesophageal reflux disease)    History of kidney stones    Hyperlipidemia    Hypertension    Interstitial lung disease (HCC)    Pollen allergies    Presence of permanent cardiac pacemaker    Pulmonary fibrosis ([place])    Squamous cell carcinoma 2014   skin cancer   Subclinical hyperthyroidism 06/17/2014    PAST SURGICAL HISTORY:   Past Surgical History:  Procedure Laterality Date   CESAREAN SECTION     COSMETIC SURGERY     skin cancer face   CYSTOSCOPY W/ URETERAL STENT PLACEMENT Left 07/15/2020   Procedure: CYSTOSCOPY WITH RETROGRADE PYELOGRAM/URETERAL STENT PLACEMENT;  Surgeon: Abbie Sons, MD;  Location: [facility];  Service: Urology;  Laterality:  Left;   CYSTOSCOPY/URETEROSCOPY/HOLMIUM LASER/STENT PLACEMENT Left 07/28/2020   Procedure: CYSTOSCOPY/URETEROSCOPY/HOLMIUM LASER/STENT Exchange;  Surgeon: Hollice Espy, MD;  Location: [facility];  Service: Urology;  Laterality: Left;   EP IMPLANTABLE DEVICE N/A 02/26/2016   Procedure: Pacemaker Implant;  Surgeon: Will Meredith Leeds, MD;  Location: [facility];  Service: Cardiovascular;  Laterality: N/A;   TUBAL LIGATION      SOCIAL HISTORY:    Social History   Tobacco Use   Smoking status: Former    Packs/day: 0.50    Years: 30.00    Total pack years: 15.00    Types: Cigarettes    Quit date: 2012    Years since quitting: 11.9   Smokeless tobacco: Never  Substance Use Topics   Alcohol use: No    FAMILY HISTORY:   Family History  Problem Relation Age of Onset   Anxiety disorder Mother    Hypertension Mother    Ovarian cancer Mother 64   Cancer Maternal Aunt        breast   Heart failure Maternal Grandfather     DRUG ALLERGIES:   Allergies  Allergen Reactions   Codeine Hives and Itching   Sulfonamide Derivatives Hives   Paroxetine Other (See Comments)    unknown   Lisinopril Rash   Losartan Potassium Other (See Comments)    Leg felt heavy   Neomycin-Polymyxin-Dexameth Itching, Swelling and Other (See Comments)    Ey drop/Swelling and itching of eye    Oxycodone-Acetaminophen Other (See Comments)    Mild SOB, feels funny   Sulfa Antibiotics Hives and Rash         REVIEW OF SYSTEMS:   ROS As per history of present illness. All pertinent systems were reviewed above. Constitutional, HEENT, cardiovascular, respiratory, GI, GU, musculoskeletal, neuro, psychiatric, endocrine, integumentary and hematologic systems were reviewed and are otherwise negative/unremarkable except for positive findings mentioned above in the HPI.   MEDICATIONS AT HOME:   Prior to Admission medications   Medication Sig Start Date End Date Taking? Authorizing Provider  carvedilol (COREG) 25 MG tablet TAKE ONE TABLET BY MOUTH TWICE A DAY WITH MEALS 02/10/22  Yes Camnitz, Will Hassell Done, MD  diazepam (VALIUM) 5 MG tablet TAKE ONE TABLET BY MOUTH EVERY 8 HOURS AS NEEDED FOR ANXIETY 06/22/22  Yes Copland, Frederico Hamman, MD  doxycycline (VIBRA-TABS) 100 MG tablet Take 1 tablet (100 mg total) by mouth 2 (two) times daily. 09/01/22  Yes Copland, Frederico Hamman, MD  spironolactone (ALDACTONE) 25 MG tablet TAKE ONE TABLET BY MOUTH DAILY 02/08/22  Yes  Copland, Frederico Hamman, MD  acetaminophen (TYLENOL) 500 MG tablet Take 1,000 mg by mouth every 6 (six) hours as needed. As needed    [provider]  albuterol (VENTOLIN HFA) 108 (90 Base) MCG/ACT inhaler Inhale 2 puffs into the lungs every 6 (six) hours as needed for wheezing or shortness of breath.    [provider]  famotidine (PEPCID) 20 MG tablet Take 1 tablet (20 mg total) by mouth daily. 04/06/20   Nolberto Hanlon, MD      VITAL SIGNS:  Blood pressure (!) 150/96, pulse (!) 104, temperature 97.8 F (36.6 C), temperature source Oral, resp. rate (!) 24, SpO2 99 %.  PHYSICAL EXAMINATION:  Physical Exam  GENERAL:  61 y.o.-year-old Caucasian female patient lying in the bed with no acute di.MYTIA  EYES: Pupils equal, round, reactive to light and accommodation. No scleral icterus. Extraocular muscles intact.  HEENT: Head atraumatic, normocephalic. Oropharynx and nasopharynx clear.  NECK:  Supple,  no jugular venous distention. No thyroid enlargement, no tenderness.  LUNGS: Normal breath sounds bilaterally, no wheezing, rales,rhonchi or crepitation. No use of accessory muscles of respiration.  CARDIOVASCULAR: Regular rate and rhythm, S1, S2 normal. No murmurs, rubs, or gallops.  ABDOMEN: Soft, nondistended, nontender. Bowel sounds present. No organomegaly or mass.  EXTREMITIES: No pedal edema, cyanosis, or clubbing.  NEUROLOGIC: Cranial nerves II through XII are intact. Muscle strength 5/5 in all extremities. Sensation intact. Gait not checked.  PSYCHIATRIC: The patient is alert but mildly confused.  Normal affect and good eye contact. SKIN: No obvious rash, lesion, or ulcer.   LABORATORY PANEL:   CBC Recent Labs  Lab 09/02/22 1827  WBC 9.3  HGB 15.6*  HCT 45.3  PLT 213   ------------------------------------------------------------------------------------------------------------------  Chemistries  Recent Labs  Lab 09/02/22 1827  NA 136  K 3.9  CL 100  CO2 28   GLUCOSE 106*  BUN 8  CREATININE 0.63  CALCIUM 9.7  AST 25  ALT 19  ALKPHOS 54  BILITOT 1.3*   ------------------------------------------------------------------------------------------------------------------  Cardiac Enzymes No results for input(s): "TROPONINI" in the last 168 hours. ------------------------------------------------------------------------------------------------------------------  RADIOLOGY:  CT Head Wo Contrast  Result Date: 09/02/2022 CLINICAL DATA:  Altered mental status EXAM: CT HEAD WITHOUT CONTRAST TECHNIQUE: Contiguous axial images were obtained from the base of the skull through the vertex without intravenous contrast. RADIATION DOSE REDUCTION: This exam was performed according to the departmental dose-optimization program which includes automated exposure control, adjustment of the mA and/or kV according to patient size and/or use of iterative reconstruction technique. COMPARISON:  CT 05/22/2015.  MRI 06/03/2015 FINDINGS: Brain: No evidence of acute infarction, hemorrhage, hydrocephalus, extra-axial collection or mass lesion/mass effect. Vascular: No hyperdense vessel or unexpected calcification. Skull: Normal. Negative for fracture or focal lesion. Sinuses/Orbits: No acute finding. Other: None. IMPRESSION: No acute intracranial abnormalities. Electronically Signed   By: Burman Nieves M.D.   On: 09/02/2022 19:56   DG Chest Portable 1 View  Result Date: 09/02/2022 CLINICAL DATA:  Shortness of breath EXAM: PORTABLE CHEST 1 VIEW COMPARISON:  10/19/2021 FINDINGS: Left-sided implanted cardiac device remains in place. Heart size within normal limits. Aortic atherosclerosis. Chronic fibrotic changes in the right upper lobe. No focal airspace consolidation, pleural effusion, or pneumothorax. IMPRESSION: No active disease. Electronically Signed   By: Duanne Guess D.O.   On: 09/02/2022 19:09      IMPRESSION AND PLAN:  Assessment and Plan: * COVID-19 virus  infection - The patient be admitted to a medical telemetry bed. - She has subsequent sepsis due to COVID-19 infection as manifested by tachycardia and tachypnea - We will continue her on Paxlovid. - Given associated acute encephalopathy we will continue steroid therapy with IV Solu-Medrol. - Will follow inflammatory markers. - She will be placed on vitamin C and zinc sulfate. - We will hydrate with IV normal saline.  Acute encephalopathy - This is clearly secondary to her COVID-19     - Management as above. - We will monitor mental status. - Her noncontrasted head CT scan came back negative.  GERD without esophagitis - We will continue H2 blocker therapy.  Chronic systolic CHF (congestive heart failure) (HCC) - We will continue her Coreg and Aldactone.  Generalized anxiety disorder - We will continue her as needed Valium.  Essential hypertension - We will continue her Coreg.    DVT prophylaxis: Lovenox.  Advanced Care Planning:  Code Status: full code.  Family Communication:  The plan of care was discussed in details  with the patient (and family). I answered all questions. The patient agreed to proceed with the above mentioned plan. Further management will depend upon hospital course. Disposition Plan: Back to previous home environment Consults called: none.  All the records are reviewed and case discussed with ED provider.  Status is: Inpatient  At the time of the admission, it appears that the appropriate admission status for this patient is inpatient.  This is judged to be reasonable and necessary in order to provide the required intensity of service to ensure the patient's safety given the presenting symptoms, physical exam findings and initial radiographic and laboratory data in the context of comorbid conditions.  The patient requires inpatient status due to high intensity of service, high risk of further deterioration and high frequency of surveillance required.  I  certify that at the time of admission, it is my clinical judgment that the patient will require inpatient hospital care extending more than 2 midnights.                            Dispo: The patient is from: Home              Anticipated d/c is to: Home              Patient currently is not medically stable to d/c.              Difficult to place patient: No  Christel Mormon M.D on 09/02/2022 at 11:17 PM  [facility]   From 7 PM-7 AM, contact night-coverage [email or web address]  CC: Primary care physician; Owens Loffler, MD

## 2022-09-02 NOTE — Assessment & Plan Note (Signed)
-   We will continue her Coreg and Aldactone.

## 2022-09-02 NOTE — Assessment & Plan Note (Signed)
-   We will continue her Coreg.

## 2022-09-02 NOTE — Assessment & Plan Note (Signed)
-   We will continue H2 blocker therapy. 

## 2022-09-02 NOTE — Assessment & Plan Note (Signed)
-   We will continue her as needed Valium.

## 2022-09-02 NOTE — Progress Notes (Signed)
PHARMACIST - PHYSICIAN COMMUNICATION  CONCERNING:  Enoxaparin (Lovenox) for DVT Prophylaxis    RECOMMENDATION: Patient was prescribed enoxaprin 40mg  q24 hours for VTE prophylaxis.   There were no vitals filed for this visit.  There is no height or weight on file to calculate BMI.  Estimated Creatinine Clearance: 83.3 mL/min (by C-G formula based on SCr of 0.63 mg/dL).   Based on [facility] policy patient is candidate for enoxaparin 0.5mg /kg TBW SQ every 24 hours based on BMI being >30.  DESCRIPTION: Pharmacy has adjusted enoxaparin dose per [facility] policy.  Patient is now receiving enoxaparin 0.5 mg/kg every 24 hours   [facility], PharmD, [facility] 09/02/2022 11:16 PM

## 2022-09-02 NOTE — ED Notes (Signed)
Rainbow, lactic and 1st set of blood cultures sent to lab.   Pt unable to verify her name and date of birth.  Husband confirmed

## 2022-09-02 NOTE — Assessment & Plan Note (Addendum)
-   The patient be admitted to a medical telemetry bed. - She has subsequent sepsis due to COVID-19 infection as manifested by tachycardia and tachypnea - We will continue her on Paxlovid. - Given associated acute encephalopathy we will continue steroid therapy with IV Solu-Medrol. - Will follow inflammatory markers. - She will be placed on vitamin C and zinc sulfate. - We will hydrate with IV normal saline.

## 2022-09-03 ENCOUNTER — Telehealth: Payer: Self-pay | Admitting: *Deleted

## 2022-09-03 ENCOUNTER — Telehealth: Payer: Self-pay

## 2022-09-03 DIAGNOSIS — K219 Gastro-esophageal reflux disease without esophagitis: Secondary | ICD-10-CM

## 2022-09-03 DIAGNOSIS — I10 Essential (primary) hypertension: Secondary | ICD-10-CM

## 2022-09-03 DIAGNOSIS — G9340 Encephalopathy, unspecified: Secondary | ICD-10-CM | POA: Diagnosis not present

## 2022-09-03 DIAGNOSIS — I5022 Chronic systolic (congestive) heart failure: Secondary | ICD-10-CM | POA: Diagnosis not present

## 2022-09-03 DIAGNOSIS — U071 COVID-19: Secondary | ICD-10-CM | POA: Diagnosis not present

## 2022-09-03 LAB — D-DIMER, QUANTITATIVE: D-Dimer, Quant: 0.95 ug/mL-FEU — ABNORMAL HIGH (ref 0.00–0.50)

## 2022-09-03 LAB — COMPREHENSIVE METABOLIC PANEL
ALT: 18 U/L (ref 0–44)
AST: 22 U/L (ref 15–41)
Albumin: 4.3 g/dL (ref 3.5–5.0)
Alkaline Phosphatase: 49 U/L (ref 38–126)
Anion gap: 8 (ref 5–15)
BUN: 9 mg/dL (ref 6–20)
CO2: 24 mmol/L (ref 22–32)
Calcium: 9.3 mg/dL (ref 8.9–10.3)
Chloride: 103 mmol/L (ref 98–111)
Creatinine, Ser: 0.63 mg/dL (ref 0.44–1.00)
GFR, Estimated: >60 mL/min (ref >60)
Glucose, Bld: 159 mg/dL — ABNORMAL HIGH (ref 70–99)
Potassium: 3.8 mmol/L (ref 3.5–5.1)
Sodium: 135 mmol/L (ref 135–145)
Total Bilirubin: 1.3 mg/dL — ABNORMAL HIGH (ref 0.3–1.2)
Total Protein: 8 g/dL (ref 6.5–8.1)

## 2022-09-03 LAB — CBC WITH DIFFERENTIAL/PLATELET
Abs Immature Granulocytes: 0.02 10*3/uL (ref 0.00–0.07)
Basophils Absolute: 0 10*3/uL (ref 0.0–0.1)
Basophils Relative: 1 %
Eosinophils Absolute: 0 10*3/uL (ref 0.0–0.5)
Eosinophils Relative: 0 %
HCT: 41.7 % (ref 36.0–46.0)
Hemoglobin: 14.3 g/dL (ref 12.0–15.0)
Immature Granulocytes: 0 %
Lymphocytes Relative: 12 %
Lymphs Abs: 0.9 10*3/uL (ref 0.7–4.0)
MCH: 31 pg (ref 26.0–34.0)
MCHC: 34.3 g/dL (ref 30.0–36.0)
MCV: 90.3 fL (ref 80.0–100.0)
Monocytes Absolute: 0.2 10*3/uL (ref 0.1–1.0)
Monocytes Relative: 3 %
Neutro Abs: 6.6 10*3/uL (ref 1.7–7.7)
Neutrophils Relative %: 84 %
Platelets: 192 10*3/uL (ref 150–400)
RBC: 4.62 MIL/uL (ref 3.87–5.11)
RDW: 11.9 % (ref 11.5–15.5)
WBC: 7.9 10*3/uL (ref 4.0–10.5)
nRBC: 0 % (ref 0.0–0.2)

## 2022-09-03 LAB — HIV ANTIBODY (ROUTINE TESTING W REFLEX): HIV Screen 4th Generation wRfx: NONREACTIVE

## 2022-09-03 LAB — MAGNESIUM: Magnesium: 1.8 mg/dL (ref 1.7–2.4)

## 2022-09-03 LAB — C-REACTIVE PROTEIN: CRP: 2.9 mg/dL — ABNORMAL HIGH (ref <1.0)

## 2022-09-03 LAB — FERRITIN: Ferritin: 186 ng/mL (ref 11–307)

## 2022-09-03 MED ORDER — ACETAMINOPHEN 500 MG PO TABS
1000.0000 mg | ORAL_TABLET | Freq: Once | ORAL | Status: AC
  Administered 2022-09-03: 1000 mg via ORAL
  Filled 2022-09-03: qty 2

## 2022-09-03 MED ORDER — ASCORBIC ACID 500 MG PO TABS: 500.0000 mg | ORAL_TABLET | Freq: Every day | ORAL | 0 refills | Status: AC

## 2022-09-03 MED ORDER — NIRMATRELVIR/RITONAVIR (PAXLOVID)TABLET: 3.0000 | ORAL_TABLET | Freq: Two times a day (BID) | ORAL | 0 refills | Status: AC

## 2022-09-03 MED ORDER — ZINC SULFATE 220 (50 ZN) MG PO CAPS: 220.0000 mg | ORAL_CAPSULE | Freq: Every day | ORAL | 0 refills | Status: AC

## 2022-09-03 MED ORDER — GUAIFENESIN-DM 100-10 MG/5ML PO SYRP: 10.0000 mL | ORAL_SOLUTION | ORAL | 0 refills | Status: AC | PRN

## 2022-09-03 NOTE — ED Notes (Signed)
PT picked up by transport to go to floor.

## 2022-09-03 NOTE — Discharge Summary (Signed)
Physician Discharge Summary  Anne Rush QJF:[id number] DOB: Aug 17, 1961 DOA: 09/02/2022  PCP: [name], MD  Admit date: 09/02/2022 Discharge date: 09/03/2022  Admitted From: Home  Discharge disposition: Home   Recommendations for Outpatient Follow-Up:   Follow up with your primary care provider in one week.  Check CBC, BMP, magnesium in the next visit  Discharge Diagnosis:   Principal Problem:   COVID-19 virus infection Active Problems:   Acute encephalopathy   Essential hypertension   Generalized anxiety disorder   Chronic systolic CHF (congestive heart failure) (HCC)   GERD without esophagitis   Discharge Condition: Improved.  Diet recommendation: Low sodium, heart healthy.    Wound care: None.  Code status: Full.   History of Present Illness:   Anne Rush is a 61 years old female with past medical history of chronic systolic heart failure, permanent pacemaker placement for complete heart block generalized anxiety disorder, GERD, hypertension, dyslipidemia, interstitial lung disease and IPF, presented to hospital with sore throat diarrhea cough congestion with productive sputum, generalized myalgia.  She was also noted to be altered with confusion and hallucinations as per the patient's husband.    She has never been vaccinated for COVID-19 but had her influenza vaccine this year.  Patient was seen by her primary care provider prior to coming to the hospital.  In the ED, patient was tachycardic blood pressure was elevated.  Labs showed elevated bilirubin but CBC was within normal range.  Lactate was 1.9.  COVID-19 PCR was positive.  Influenza antigens and RSV PCR was negative.  EKG was unremarkable.  Chest x-ray showed no acute infiltrate.  CT scan of the head did not show any acute findings.  Patient was given IV fluid bolus p.o. Paxlovid Solu-Medrol and was admitted hospital for further evaluation.  Hospital Course:   Following conditions were addressed  during hospitalization as listed below,  Sepsis secondary to COVID-19 virus infection - Patient had tachycardia tachypnea with COVID 19 Infection.  On Paxlovid.   Continue vitamin C and zinc and supportive care.  Ferritin within normal range.  D-dimer mildly elevated at 0.9.  CRP pending.  HIV pending.  At this time patient has symptomatically improved.  Not on supplemental oxygen.  Will discontinue steroids.  Will continue supportive care on discharge.  Acute encephalopathy Secondary to COVID-19 infection.  Will discontinue steroids.  Patient was advised to minimize Valium.  Discontinue Tussionex.  Patient is at her baseline at this time.   GERD without esophagitis   Chronic systolic CHF (congestive heart failure) (HCC) Compensated.  Continue Coreg and Aldactone.   Generalized anxiety disorder On Valium at home.  Advised to minimize if possible.   Essential hypertension Continue Coreg.  Disposition.  At this time, patient is stable for disposition home with outpatient PCP follow-up.  Spoke with the patient husband at bedside prior to disposition.  Medical Consultants:   None.  Procedures:    None Subjective:   Today, patient seen and examined at bedside.  Continues to feel better.  Has mild congestion.  No confusion or disorientation.  No hallucinations.  Was able to ambulate to the bathroom.  Patient has been at bedside.  Discharge Exam:   Vitals:   09/03/22 0531 09/03/22 0752  BP:  136/84  Pulse:  89  Resp:    Temp: 98 F (36.7 C) 97.9 F (36.6 C)  SpO2:  93%   Vitals:   09/03/22 0025 09/03/22 0157 09/03/22 0531 09/03/22 0752  BP: (!) 140/83  139/88  136/84  Pulse: 100 (!) 104  89  Resp: 18 20    Temp:  99.6 F (37.6 C) 98 F (36.7 C) 97.9 F (36.6 C)  TempSrc:   Oral   SpO2: 98% 94%  93%   There is no height or weight on file to calculate BMI.  General: Alert awake, not in obvious distress, obese HENT: pupils equally reacting to light,  No scleral pallor  or icterus noted. Oral mucosa is moist.  Chest:    Diminished breath sounds bilaterally. No crackles or wheezes.  CVS: S1 &S2 heard. No murmur.  Regular rate and rhythm. Abdomen: Soft, nontender, nondistended.  Bowel sounds are heard.   Extremities: No cyanosis, clubbing or edema.  Peripheral pulses are palpable. Psych: Alert, awake and oriented, normal mood CNS:  No cranial nerve deficits.  Power equal in all extremities.   Skin: Warm and dry.  No rashes noted.  The results of significant diagnostics from this hospitalization (including imaging, microbiology, ancillary and laboratory) are listed below for reference.     Diagnostic Studies:   CT Head Wo Contrast  Result Date: 09/02/2022 CLINICAL DATA:  Altered mental status EXAM: CT HEAD WITHOUT CONTRAST TECHNIQUE: Contiguous axial images were obtained from the base of the skull through the vertex without intravenous contrast. RADIATION DOSE REDUCTION: This exam was performed according to the departmental dose-optimization program which includes automated exposure control, adjustment of the mA and/or kV according to patient size and/or use of iterative reconstruction technique. COMPARISON:  CT 05/22/2015.  MRI 06/03/2015 FINDINGS: Brain: No evidence of acute infarction, hemorrhage, hydrocephalus, extra-axial collection or mass lesion/mass effect. Vascular: No hyperdense vessel or unexpected calcification. Skull: Normal. Negative for fracture or focal lesion. Sinuses/Orbits: No acute finding. Other: None. IMPRESSION: No acute intracranial abnormalities. Electronically Signed   By: Burman Nieves M.D.   On: 09/02/2022 19:56   DG Chest Portable 1 View  Result Date: 09/02/2022 CLINICAL DATA:  Shortness of breath EXAM: PORTABLE CHEST 1 VIEW COMPARISON:  10/19/2021 FINDINGS: Left-sided implanted cardiac device remains in place. Heart size within normal limits. Aortic atherosclerosis. Chronic fibrotic changes in the right upper lobe. No focal  airspace consolidation, pleural effusion, or pneumothorax. IMPRESSION: No active disease. Electronically Signed   By: Duanne Guess D.O.   On: 09/02/2022 19:09     Labs:   Basic Metabolic Panel: Recent Labs  Lab 09/02/22 1827 09/02/22 1829 09/03/22 0329  NA 136  --  135  K 3.9  --  3.8  CL 100  --  103  CO2 28  --  24  GLUCOSE 106*  --  159*  BUN 8  --  9  CREATININE 0.63  --  0.63  CALCIUM 9.7  --  9.3  MG  --  1.8  --    GFR Estimated Creatinine Clearance: 83.3 mL/min (by C-G formula based on SCr of 0.63 mg/dL). Liver Function Tests: Recent Labs  Lab 09/02/22 1827 09/03/22 0329  AST 25 22  ALT 19 18  ALKPHOS 54 49  BILITOT 1.3* 1.3*  PROT 8.5* 8.0  ALBUMIN 4.6 4.3   No results for input(s): "LIPASE", "AMYLASE" in the last 168 hours. No results for input(s): "AMMONIA" in the last 168 hours. Coagulation profile Recent Labs  Lab 09/02/22 1827  INR 1.1    CBC: Recent Labs  Lab 09/02/22 1827 09/03/22 0329  WBC 9.3 7.9  NEUTROABS  --  6.6  HGB 15.6* 14.3  HCT 45.3 41.7  MCV 89.7 90.3  PLT 213 192   Cardiac Enzymes: No results for input(s): "CKTOTAL", "CKMB", "CKMBINDEX", "TROPONINI" in the last 168 hours. BNP: Invalid input(s): "POCBNP" CBG: No results for input(s): "GLUCAP" in the last 168 hours. D-Dimer Recent Labs    09/03/22 0329  DDIMER 0.95*   Hgb A1c No results for input(s): "HGBA1C" in the last 72 hours. Lipid Profile No results for input(s): "CHOL", "HDL", "LDLCALC", "TRIG", "CHOLHDL", "LDLDIRECT" in the last 72 hours. Thyroid function studies No results for input(s): "TSH", "T4TOTAL", "T3FREE", "THYROIDAB" in the last 72 hours.  Invalid input(s): "FREET3" Anemia work up Recent Labs    09/03/22 0329  FERRITIN 186   Microbiology Recent Results (from the past 240 hour(s))  Blood culture (routine x 2)     Status: None (Preliminary result)   Collection Time: 09/02/22  6:27 PM   Specimen: BLOOD  Result Value Ref Range Status    Specimen Description BLOOD BLOOD RIGHT FOREARM  Final   Special Requests   Final    BOTTLES DRAWN AEROBIC AND ANAEROBIC Blood Culture adequate volume   Culture   Final    NO GROWTH < 12 HOURS Performed at [facility], [address], [place], [place] [address]    Report Status PENDING  Incomplete  Resp panel by RT-PCR (RSV, Flu A&B, Covid) Anterior Nasal Swab     Status: Abnormal   Collection Time: 09/02/22  6:50 PM   Specimen: Anterior Nasal Swab  Result Value Ref Range Status   SARS Coronavirus 2 by RT PCR POSITIVE (A) NEGATIVE Final   Influenza A by PCR NEGATIVE NEGATIVE Final   Influenza B by PCR NEGATIVE NEGATIVE Final   Resp Syncytial Virus by PCR NEGATIVE NEGATIVE Final    Comment: Performed at [facility], [address] [place], [place] [address]  Blood culture (routine x 2)     Status: None (Preliminary result)   Collection Time: 09/02/22  6:53 PM   Specimen: BLOOD  Result Value Ref Range Status   Specimen Description BLOOD BLOOD RIGHT ARM  Final   Special Requests   Final    BOTTLES DRAWN AEROBIC AND ANAEROBIC Blood Culture adequate volume   Culture   Final    NO GROWTH < 12 HOURS Performed at [facility], [address], [place], [place] [address]    Report Status PENDING  Incomplete     Discharge Instructions:   Discharge Instructions     Diet - low sodium heart healthy   Complete by: As directed    Discharge instructions   Complete by: As directed    Follow-up with your primary care physician in 1 week.  Seek medical attention for worsening symptoms.  Complete the course of Paxlovid.  Warm fluids.   Increase activity slowly   Complete by: As directed       Allergies as of 09/03/2022       Reactions   Codeine Hives, Itching   Sulfonamide Derivatives Hives   Paroxetine Other (See Comments)   unknown   Lisinopril Rash   Losartan Potassium Other (See Comments)   Leg felt heavy   Neomycin-polymyxin-dexameth Itching,  Swelling, Other (See Comments)   Ey drop/Swelling and itching of eye    Oxycodone-acetaminophen Other (See Comments)   Mild SOB, feels funny   Sulfa Antibiotics Hives, Rash           Medication List     STOP taking these medications    doxycycline 100 MG tablet Commonly known as: VIBRA-TABS  TAKE these medications    acetaminophen 500 MG tablet Commonly known as: TYLENOL Take 1,000 mg by mouth every 6 (six) hours as needed. As needed   albuterol 108 (90 Base) MCG/ACT inhaler Commonly known as: VENTOLIN HFA Inhale 2 puffs into the lungs every 6 (six) hours as needed for wheezing or shortness of breath.   ascorbic acid 500 MG tablet Commonly known as: VITAMIN C Take 1 tablet (500 mg total) by mouth daily.   carvedilol 25 MG tablet Commonly known as: COREG TAKE ONE TABLET BY MOUTH TWICE A DAY WITH MEALS   diazepam 5 MG tablet Commonly known as: VALIUM TAKE ONE TABLET BY MOUTH EVERY 8 HOURS AS NEEDED FOR ANXIETY   famotidine 20 MG tablet Commonly known as: PEPCID Take 1 tablet (20 mg total) by mouth daily.   guaiFENesin-dextromethorphan 100-10 MG/5ML syrup Commonly known as: ROBITUSSIN DM Take 10 mLs by mouth every 4 (four) hours as needed for up to 5 days for cough.   nirmatrelvir/ritonavir EUA 20 x 150 MG & 10 x 100MG  Tabs Commonly known as: PAXLOVID Take 3 tablets by mouth 2 (two) times daily for 5 days. Take nirmatrelvir (150 mg) two tablets twice daily for 5 days and ritonavir (100 mg) one tablet twice daily for 5 days.   spironolactone 25 MG tablet Commonly known as: ALDACTONE TAKE ONE TABLET BY MOUTH DAILY   zinc sulfate 220 (50 Zn) MG capsule Take 1 capsule (220 mg total) by mouth daily.          Time coordinating discharge: 39 minutes  Signed:  Iowa Kappes  [facility] 09/03/2022, 9:02 AM

## 2022-09-03 NOTE — ED Notes (Signed)
Patient denies pain and is resting comfortably.  

## 2022-09-03 NOTE — Patient Outreach (Signed)
  Care Coordination [facility] Medical Note Transition Care Management Unsuccessful Follow-up Telephone Call  Date of discharge and from where:  [facility] [phone]  Attempts:  1st Attempt  Reason for unsuccessful TCM follow-up call:  Voice mail full  Gean Maidens BSN RN [facility] Care [facility] [phone]

## 2022-09-03 NOTE — Telephone Encounter (Signed)
Per chart review tab pt was admitted to [facility]. Sending note to Dr Patsy Lager, [place] pool.

## 2022-09-03 NOTE — Telephone Encounter (Signed)
[facility] Night - Client TELEPHONE ADVICE RECORD Access[facility]urse Patient Name: Anne Rush ER Gender: Female DOB: 09-23-1960 Age: 61 Y 8 M 22 D Return Phone Number: [phone] (Primary) Address: City/ State/ Zip: [place]  [address] Client [facility] Night - Client Client Site  [facility] - Night Provider [name] - MD Contact Type Call Who Is Calling Patient / Member / Family / Caregiver Call Type Triage / Clinical Caller Name [name] Gazda Relationship To Patient Spouse Return Phone Number [phone] (Primary) Chief Complaint CONFUSION - new onset Reason for Call Symptomatic / Request for Health Information Initial Comment Caller states he is calling for his wife who is having confusion and seeing people that are not there. Translation No Nurse Assessment Nurse: Gerre Pebbles, RN, Casimiro Needle Date/Time Lamount Cohen Time): 09/02/2022 5:39:19 PM Confirm and document reason for call. If symptomatic, describe symptoms. ---Caller states that wife saw MD yesterday for sinus infection. Was given doxycycline and has taken 3 doses so far and has taken Tylenol. Caller states that wife thought someone was in house while he stepped out of the house. Didn't know who she was. Does the patient have any new or worsening symptoms? ---Yes Will a triage be completed? ---Yes Related visit to physician within the last 2 weeks? ---Yes Does the PT have any chronic conditions? (i.e. diabetes, asthma, this includes High risk factors for pregnancy, etc.) ---Yes List chronic conditions. ---lung disease, septic Is this a behavioral health or substance abuse call? ---No Guidelines Guideline Title Affirmed Question Affirmed Notes Nurse Date/Time (Eastern Time) Confusion - Delirium [1] Difficult to awaken or acting confused (e.g., disoriented, slurred speech) AND [2] present now AND [3] new-onset Gust Brooms  09/02/2022 5:42:16 PM PLEASE NOTE: All timestamps contained within this report are represented as [place] Standard Time. CONFIDENTIALTY NOTICE: This fax transmission is intended only for the addressee. It contains information that is legally privileged, confidential or otherwise protected from use or disclosure. If you are not the intended recipient, you are strictly prohibited from reviewing, disclosing, copying using or disseminating any of this information or taking any action in reliance on or regarding this information. If you have received this fax in error, please notify us immediately by telephone so that we can arrange for its return to [place]. Phone: [phone], Toll-Free: [phone], Fax: [phone] Page: 2 of 2 Call Id: [id number] Disp. Time Lamount Cohen Time) Disposition Final User 09/02/2022 5:37:02 PM Send to Urgent Queue [place] Pert 09/02/2022 5:42:27 PM Call EMS 911 Now Yes Gust Brooms 09/02/2022 5:47:48 PM 911 Outcome Documentation Gerre Pebbles, RN, Casimiro Needle Reason: Caller stats that he is driving his wife to ER now Final Disposition 09/02/2022 5:42:27 PM Call EMS 911 Now Yes Gerre Pebbles, RN, Dawayne Cirri Disagree/Comply Comply Caller Understands Yes PreDisposition InappropriateToAsk Care Advice Given Per Guideline CALL EMS 911 NOW: CARE ADVICE given per Confusion-Delirium (Adult) guideline

## 2022-09-03 NOTE — Plan of Care (Signed)
  Problem: Clinical Measurements: Goal: Respiratory complications will improve Outcome: Progressing Goal: Cardiovascular complication will be avoided Outcome: Progressing   Problem: Activity: Goal: Risk for activity intolerance will decrease Outcome: Progressing   Problem: Nutrition: Goal: Adequate nutrition will be maintained Outcome: Progressing   Problem: Elimination: Goal: Will not experience complications related to bowel motility Outcome: Progressing Goal: Will not experience complications related to urinary retention Outcome: Progressing   Problem: Pain Managment: Goal: General experience of comfort will improve Outcome: Progressing

## 2022-09-07 ENCOUNTER — Telehealth: Payer: Self-pay | Admitting: *Deleted

## 2022-09-07 LAB — CULTURE, BLOOD (ROUTINE X 2)
Culture: NO GROWTH
Culture: NO GROWTH
Special Requests: ADEQUATE
Special Requests: ADEQUATE

## 2022-09-07 NOTE — Patient Outreach (Signed)
  Care Coordination [facility] Note Transition Care Management Unsuccessful Follow-up Telephone Call  Date of discharge and from where:  [facility] [phone]  Attempts:  2nd Attempt  Reason for unsuccessful TCM follow-up call:  Left voice message  Gean Maidens BSN RN [facility] [phone]

## 2022-09-08 ENCOUNTER — Telehealth: Payer: Self-pay

## 2022-09-08 ENCOUNTER — Ambulatory Visit: Payer: BC Managed Care – PPO | Admitting: Psychology

## 2022-09-08 DIAGNOSIS — J9601 Acute respiratory failure with hypoxia: Secondary | ICD-10-CM | POA: Diagnosis not present

## 2022-09-08 DIAGNOSIS — I5189 Other ill-defined heart diseases: Secondary | ICD-10-CM | POA: Diagnosis not present

## 2022-09-08 NOTE — Patient Outreach (Signed)
  Care Coordination TOC Note Transition Care Management Follow-up Telephone Call Date of discharge and from where: 09/03/22-[facility]   Dx: "COVID-19 infection" How have you been since you were released from the hospital? Patient states she is doing good. Denies any SOB or other resp issues. She reports an occasional dry cough. She completed Paxlovid therapy yesterday. Appetite has been good. No issues with elimination. She is sleeping/resting well. Patient has been active,ambulating and resuming previous activities. Any questions or concerns? No  Items Reviewed: Did the pt receive and understand the discharge instructions provided? Yes  Medications obtained and verified? Yes  Other? Yes  Any new allergies since your discharge? No  Dietary orders reviewed? Yes Do you have support at home? Yes   Home Care and Equipment/Supplies: Were home health services ordered? not applicable If so, what is the name of the agency? N/A  Has the agency set up a time to come to the patient's home? not applicable Were any new equipment or medical supplies ordered?  No What is the name of the medical supply agency? N/A Were you able to get the supplies/equipment? not applicable Do you have any questions related to the use of the equipment or supplies? No  Functional Questionnaire: (I = Independent and D = Dependent) ADLs: I  Bathing/Dressing- I  Meal Prep- I  Eating- I  Maintaining continence- I  Transferring/Ambulation- I  Managing Meds- I  Follow up appointments reviewed:  PCP Hospital f/u appt confirmed? Yes  Scheduled to see Dr. Patsy Lager on 09/15/22 @ 11am. Specialist Hospital f/u appt confirmed?  N/A   Are transportation arrangements needed? No  If their condition worsens, is the pt aware to call PCP or go to the Emergency Dept.? Yes Was the patient provided with contact information for the PCP's office or ED? Yes Was to pt encouraged to call back with questions or concerns? Yes  SDOH  assessments and interventions completed:   Yes SDOH Interventions Today    Flowsheet Row Most Recent Value  SDOH Interventions   Food Insecurity Interventions Intervention Not Indicated  Transportation Interventions Intervention Not Indicated       Care Coordination Interventions:  Education provided    Encounter Outcome:  Pt. Visit Completed    Antionette Fairy, RN,BSN,CCM [facility] Care Management Telephonic Care Management Coordinator Direct Phone: [phone] Toll Free: [phone] Fax: [phone]

## 2022-09-09 ENCOUNTER — Ambulatory Visit (INDEPENDENT_AMBULATORY_CARE_PROVIDER_SITE_OTHER): Payer: BC Managed Care – PPO

## 2022-09-09 DIAGNOSIS — I442 Atrioventricular block, complete: Secondary | ICD-10-CM

## 2022-09-09 LAB — CUP PACEART REMOTE DEVICE CHECK
Battery Remaining Longevity: 31 mo
Battery Remaining Percentage: 27 %
Battery Voltage: 2.9 V
Brady Statistic AP VP Percent: 1.1 %
Brady Statistic AP VS Percent: 1 %
Brady Statistic AS VP Percent: 99 %
Brady Statistic AS VS Percent: 1 %
Brady Statistic RA Percent Paced: 1.1 %
Brady Statistic RV Percent Paced: 99 %
Date Time Interrogation Session: 20231221020016
Implantable Lead Connection Status: 753985
Implantable Lead Connection Status: 753985
Implantable Lead Implant Date: 20170608
Implantable Lead Implant Date: 20170608
Implantable Lead Location: 753859
Implantable Lead Location: 753860
Implantable Pulse Generator Implant Date: 20170608
Lead Channel Impedance Value: 430 Ohm
Lead Channel Impedance Value: 440 Ohm
Lead Channel Pacing Threshold Amplitude: 0.75 V
Lead Channel Pacing Threshold Amplitude: 1.125 V
Lead Channel Pacing Threshold Pulse Width: 0.4 ms
Lead Channel Pacing Threshold Pulse Width: 0.4 ms
Lead Channel Sensing Intrinsic Amplitude: 4 mV
Lead Channel Setting Pacing Amplitude: 1.375
Lead Channel Setting Pacing Amplitude: 2 V
Lead Channel Setting Pacing Pulse Width: 0.4 ms
Lead Channel Setting Sensing Sensitivity: 2.5 mV
Pulse Gen Model: 2272
Pulse Gen Serial Number: 7903881

## 2022-09-15 ENCOUNTER — Ambulatory Visit: Payer: BC Managed Care – PPO | Admitting: Family Medicine

## 2022-09-15 ENCOUNTER — Encounter: Payer: Self-pay | Admitting: Family Medicine

## 2022-09-15 VITALS — BP 130/78 | HR 85 | Temp 98.0°F | Ht 63.0 in | Wt 207.2 lb

## 2022-09-15 DIAGNOSIS — Z8616 Personal history of COVID-19: Secondary | ICD-10-CM

## 2022-09-15 DIAGNOSIS — I10 Essential (primary) hypertension: Secondary | ICD-10-CM | POA: Diagnosis not present

## 2022-09-15 DIAGNOSIS — J849 Interstitial pulmonary disease, unspecified: Secondary | ICD-10-CM | POA: Diagnosis not present

## 2022-09-15 DIAGNOSIS — U071 COVID-19: Secondary | ICD-10-CM

## 2022-09-15 DIAGNOSIS — I5022 Chronic systolic (congestive) heart failure: Secondary | ICD-10-CM | POA: Diagnosis not present

## 2022-09-15 LAB — CBC WITH DIFFERENTIAL/PLATELET
Basophils Absolute: 0.1 10*3/uL (ref 0.0–0.1)
Basophils Relative: 1.1 % (ref 0.0–3.0)
Eosinophils Absolute: 0.1 10*3/uL (ref 0.0–0.7)
Eosinophils Relative: 1 % (ref 0.0–5.0)
HCT: 40.7 % (ref 36.0–46.0)
Hemoglobin: 14.1 g/dL (ref 12.0–15.0)
Lymphocytes Relative: 38.3 % (ref 12.0–46.0)
Lymphs Abs: 2.3 10*3/uL (ref 0.7–4.0)
MCHC: 34.6 g/dL (ref 30.0–36.0)
MCV: 91 fl (ref 78.0–100.0)
Monocytes Absolute: 0.6 10*3/uL (ref 0.1–1.0)
Monocytes Relative: 9.2 % (ref 3.0–12.0)
Neutro Abs: 3 10*3/uL (ref 1.4–7.7)
Neutrophils Relative %: 50.4 % (ref 43.0–77.0)
Platelets: 235 10*3/uL (ref 150.0–400.0)
RBC: 4.47 Mil/uL (ref 3.87–5.11)
RDW: 12.2 % (ref 11.5–15.5)
WBC: 6 10*3/uL (ref 4.0–10.5)

## 2022-09-15 LAB — BASIC METABOLIC PANEL
BUN: 10 mg/dL (ref 6–23)
CO2: 30 mEq/L (ref 19–32)
Calcium: 9.6 mg/dL (ref 8.4–10.5)
Chloride: 100 mEq/L (ref 96–112)
Creatinine, Ser: 0.62 mg/dL (ref 0.40–1.20)
GFR: 97.24 mL/min (ref >60.00)
Glucose, Bld: 106 mg/dL — ABNORMAL HIGH (ref 70–99)
Potassium: 4.4 mEq/L (ref 3.5–5.1)
Sodium: 138 mEq/L (ref 135–145)

## 2022-09-15 LAB — MAGNESIUM: Magnesium: 1.8 mg/dL (ref 1.5–2.5)

## 2022-09-15 NOTE — Progress Notes (Signed)
Anne Chism T. Anne Reaves, MD, CAQ Sports Medicine [facility] [address] [place], [address]  Phone: [phone]  FAX: [phone]  Anne Rush - 61 y.o. female  MRN [id number]  Date of Birth: 1961/04/23  Date: 09/15/2022  PCP: [name], MD  Referral: [name], MD  Chief Complaint  Patient presents with   Hospitalization Follow-up    Covid 19   Subjective:   Anne Rush is a 61 y.o. very pleasant female patient with Body mass index is 36.71 kg/m. who presents with the following:  Admit date: 09/02/2022 Discharge date: 09/03/2022  Patient was admitted with acute COVID-19 on the above dates.  This is PCR confirmed COVID.  I have seen her the previous day and she had a negative rapid COVID test in the office, but subsequently she continued to deteriorate.  She also had some acute encephalopathy and altered mental status of the day of admission, but she had cleared at the time of discharge.  She was discharged on Paxlovid.  In the hospital, she was given Paxlovid as well as Solu-Medrol.  Recommended follow-up CBC, BMP, magnesium at office visit.  She is feeling better now, she is still coughing occasionally, but her breathing is better.  She is not on any oxygen, she did not require any oral prednisone after her initial Solu-Medrol bolus in the hospital.  Review of Systems is noted in the HPI, as appropriate  Objective:   BP 130/78   Pulse 85   Temp 98 F (36.7 C) (Oral)   Ht 5\' 3"  (1.6 m)   Wt 207 lb 4 oz (94 kg)   SpO2 96%   BMI 36.71 kg/m    GEN: WDWN, NAD, Non-toxic HEENT: Atraumatic, Normocephalic. Neck supple. No masses. CV: RRR, No M/G/R. No JVD. No thrill. No extra heart sounds. PULM: CTA B, no wheezes, crackles, rhonchi. No retractions. No resp. distress. No accessory muscle use. EXTR: No c/c/e NEURO Normal gait.  PSYCH: Normally interactive. Conversant.    Laboratory and Imaging Data: Labs  from hospitalization recently all reviewed.  Coronavirus positive, COVID-19  Assessment and Plan:     ICD-10-CM   1. COVID-19  U07.1 Basic metabolic panel    CBC with Differential/Platelet    Magnesium    2. Interstitial lung disease (HCC)  J84.9     3. Chronic systolic CHF (congestive heart failure) (HCC)  I50.22     4. Essential hypertension  I10      She is recovering from her COVID-19 infection.  She is breathing better and coughing less.  I suspect that she will continue to do well.  I encouraged her to get her COVID-19 vaccination, but she will not do this.  Orders placed today for conditions managed today: Orders Placed This Encounter  Procedures   Basic metabolic panel   CBC with Differential/Platelet   Magnesium    Disposition: No follow-ups on file.  Dr[employer] One speech-to-text software was used for transcription in this dictation.  Possible transcriptional errors can occur using .   Signed,  Animal nutritionist. Joyell Emami, MD   Outpatient Encounter Medications as of 09/15/2022  Medication Sig   acetaminophen (TYLENOL) 500 MG tablet Take 1,000 mg by mouth every 6 (six) hours as needed. As needed   albuterol (VENTOLIN HFA) 108 (90 Base) MCG/ACT inhaler Inhale 2 puffs into the lungs every 6 (six) hours as needed for wheezing or shortness of breath.   ascorbic acid (VITAMIN  C) 500 MG tablet Take 1 tablet (500 mg total) by mouth daily.   carvedilol (COREG) 25 MG tablet TAKE ONE TABLET BY MOUTH TWICE A DAY WITH MEALS   diazepam (VALIUM) 5 MG tablet TAKE ONE TABLET BY MOUTH EVERY 8 HOURS AS NEEDED FOR ANXIETY   famotidine (PEPCID) 20 MG tablet Take 1 tablet (20 mg total) by mouth daily.   spironolactone (ALDACTONE) 25 MG tablet TAKE ONE TABLET BY MOUTH DAILY   zinc sulfate 220 (50 Zn) MG capsule Take 1 capsule (220 mg total) by mouth daily.   Facility-Administered Encounter Medications as of 09/15/2022  Medication   albuterol (PROVENTIL) (2.5 MG/3ML)  0.083% nebulizer solution 2.5 mg

## 2022-09-22 ENCOUNTER — Encounter: Payer: Self-pay | Admitting: Family Medicine

## 2022-09-22 DIAGNOSIS — D2272 Melanocytic nevi of left lower limb, including hip: Secondary | ICD-10-CM | POA: Diagnosis not present

## 2022-09-22 DIAGNOSIS — D485 Neoplasm of uncertain behavior of skin: Secondary | ICD-10-CM | POA: Diagnosis not present

## 2022-09-22 DIAGNOSIS — Z85828 Personal history of other malignant neoplasm of skin: Secondary | ICD-10-CM | POA: Diagnosis not present

## 2022-09-22 DIAGNOSIS — D2261 Melanocytic nevi of right upper limb, including shoulder: Secondary | ICD-10-CM | POA: Diagnosis not present

## 2022-09-22 DIAGNOSIS — D23122 Other benign neoplasm of skin of left lower eyelid, including canthus: Secondary | ICD-10-CM | POA: Diagnosis not present

## 2022-09-22 DIAGNOSIS — D2262 Melanocytic nevi of left upper limb, including shoulder: Secondary | ICD-10-CM | POA: Diagnosis not present

## 2022-09-23 ENCOUNTER — Ambulatory Visit: Payer: BC Managed Care – PPO | Admitting: Psychology

## 2022-09-24 MED ORDER — MINOXIDIL 2.5 MG PO TABS: 2.5000 mg | ORAL_TABLET | Freq: Every day | ORAL | 3 refills | Status: DC

## 2022-10-01 NOTE — Progress Notes (Signed)
Remote pacemaker transmission.   

## 2022-10-09 DIAGNOSIS — J9601 Acute respiratory failure with hypoxia: Secondary | ICD-10-CM | POA: Diagnosis not present

## 2022-10-09 DIAGNOSIS — I5189 Other ill-defined heart diseases: Secondary | ICD-10-CM | POA: Diagnosis not present

## 2022-10-22 ENCOUNTER — Other Ambulatory Visit: Payer: Self-pay | Admitting: Family Medicine

## 2022-10-22 NOTE — Telephone Encounter (Signed)
Last office visit 09/15/22 for Hospital Follow Up.  Last refilled 06/22/22 for #30 with 3 refills.  CPE schedule for 12/08/2022.

## 2022-11-09 DIAGNOSIS — J9601 Acute respiratory failure with hypoxia: Secondary | ICD-10-CM | POA: Diagnosis not present

## 2022-11-09 DIAGNOSIS — I5189 Other ill-defined heart diseases: Secondary | ICD-10-CM | POA: Diagnosis not present

## 2022-11-20 ENCOUNTER — Other Ambulatory Visit: Payer: Self-pay | Admitting: Family Medicine

## 2022-11-20 DIAGNOSIS — R7303 Prediabetes: Secondary | ICD-10-CM

## 2022-11-20 DIAGNOSIS — Z1159 Encounter for screening for other viral diseases: Secondary | ICD-10-CM

## 2022-11-20 DIAGNOSIS — Z79899 Other long term (current) drug therapy: Secondary | ICD-10-CM

## 2022-11-20 DIAGNOSIS — E782 Mixed hyperlipidemia: Secondary | ICD-10-CM

## 2022-11-20 DIAGNOSIS — E559 Vitamin D deficiency, unspecified: Secondary | ICD-10-CM

## 2022-12-01 ENCOUNTER — Other Ambulatory Visit (INDEPENDENT_AMBULATORY_CARE_PROVIDER_SITE_OTHER): Payer: BC Managed Care – PPO

## 2022-12-01 DIAGNOSIS — R7303 Prediabetes: Secondary | ICD-10-CM

## 2022-12-01 DIAGNOSIS — Z1159 Encounter for screening for other viral diseases: Secondary | ICD-10-CM

## 2022-12-01 DIAGNOSIS — Z79899 Other long term (current) drug therapy: Secondary | ICD-10-CM | POA: Diagnosis not present

## 2022-12-01 DIAGNOSIS — E559 Vitamin D deficiency, unspecified: Secondary | ICD-10-CM | POA: Diagnosis not present

## 2022-12-01 DIAGNOSIS — E782 Mixed hyperlipidemia: Secondary | ICD-10-CM | POA: Diagnosis not present

## 2022-12-01 LAB — LIPID PANEL
Cholesterol: 232 mg/dL — ABNORMAL HIGH (ref 0–200)
HDL: 52 mg/dL (ref >39.00)
NonHDL: 179.64
Total CHOL/HDL Ratio: 4
Triglycerides: 251 mg/dL — ABNORMAL HIGH (ref 0.0–149.0)
VLDL: 50.2 mg/dL — ABNORMAL HIGH (ref 0.0–40.0)

## 2022-12-01 LAB — CBC WITH DIFFERENTIAL/PLATELET
Basophils Absolute: 0 10*3/uL (ref 0.0–0.1)
Basophils Relative: 0.6 % (ref 0.0–3.0)
Eosinophils Absolute: 0.1 10*3/uL (ref 0.0–0.7)
Eosinophils Relative: 1.5 % (ref 0.0–5.0)
HCT: 42.2 % (ref 36.0–46.0)
Hemoglobin: 14.3 g/dL (ref 12.0–15.0)
Lymphocytes Relative: 42.8 % (ref 12.0–46.0)
Lymphs Abs: 2.6 10*3/uL (ref 0.7–4.0)
MCHC: 33.9 g/dL (ref 30.0–36.0)
MCV: 93 fl (ref 78.0–100.0)
Monocytes Absolute: 0.5 10*3/uL (ref 0.1–1.0)
Monocytes Relative: 7.6 % (ref 3.0–12.0)
Neutro Abs: 2.9 10*3/uL (ref 1.4–7.7)
Neutrophils Relative %: 47.5 % (ref 43.0–77.0)
Platelets: 222 10*3/uL (ref 150.0–400.0)
RBC: 4.53 Mil/uL (ref 3.87–5.11)
RDW: 12.8 % (ref 11.5–15.5)
WBC: 6.1 10*3/uL (ref 4.0–10.5)

## 2022-12-01 LAB — HEPATIC FUNCTION PANEL
ALT: 21 U/L (ref 0–35)
AST: 21 U/L (ref 0–37)
Albumin: 4.3 g/dL (ref 3.5–5.2)
Alkaline Phosphatase: 51 U/L (ref 39–117)
Bilirubin, Direct: 0.1 mg/dL (ref 0.0–0.3)
Total Bilirubin: 0.6 mg/dL (ref 0.2–1.2)
Total Protein: 6.9 g/dL (ref 6.0–8.3)

## 2022-12-01 LAB — BASIC METABOLIC PANEL
BUN: 15 mg/dL (ref 6–23)
CO2: 31 mEq/L (ref 19–32)
Calcium: 9.9 mg/dL (ref 8.4–10.5)
Chloride: 101 mEq/L (ref 96–112)
Creatinine, Ser: 0.64 mg/dL (ref 0.40–1.20)
GFR: 96.36 mL/min (ref >60.00)
Glucose, Bld: 98 mg/dL (ref 70–99)
Potassium: 4 mEq/L (ref 3.5–5.1)
Sodium: 141 mEq/L (ref 135–145)

## 2022-12-01 LAB — HEMOGLOBIN A1C: Hgb A1c MFr Bld: 5.9 % (ref 4.6–6.5)

## 2022-12-01 LAB — TSH: TSH: 2.73 u[IU]/mL (ref 0.35–5.50)

## 2022-12-01 LAB — LDL CHOLESTEROL, DIRECT: Direct LDL: 156 mg/dL

## 2022-12-01 LAB — VITAMIN D 25 HYDROXY (VIT D DEFICIENCY, FRACTURES): VITD: 27.74 ng/mL — ABNORMAL LOW (ref 30.00–100.00)

## 2022-12-02 LAB — HEPATITIS C ANTIBODY: Hepatitis C Ab: NONREACTIVE

## 2022-12-03 ENCOUNTER — Encounter: Payer: Self-pay | Admitting: Pulmonary Disease

## 2022-12-03 NOTE — Patient Instructions (Signed)
Will be scheduled to see Dr. Halford Chessman sleep specialist.

## 2022-12-07 NOTE — Progress Notes (Unsigned)
Anne Oliveri T. Alam Guterrez, MD, [place] at [facility] [place], [address]  Phone: [phone]  FAX: [phone]  Anne Rush - 62 y.o. female  MRN [id number]  Date of Birth: 07-07-1961  Date: 12/08/2022  PCP: Owens Loffler, MD  Referral: Owens Loffler, MD  No chief complaint on file.  Patient Care Team: Owens Loffler, MD as PCP - General (Family Medicine) Constance Haw, MD as PCP - Electrophysiology (Cardiology) O'Neal, Cassie Freer, MD as PCP - Cardiology (Cardiology) Subjective:   Anne Rush is a 62 y.o. pleasant patient who presents with the following:  Health Maintenance Summary Reviewed and updated, unless pt declines services.  Tobacco History Reviewed. Non-smoker Alcohol: No concerns, no excessive use Exercise Habits: Some activity, rec at least 30 mins 5 times a week STD concerns: none Drug Use: None Lumps or breast concerns: no  She has a complicated medical history including interstitial lung disease, complete heart block, pacemaker, chronic systolic heart failure.  She also has a history of high blood pressure, significant anxiety and hyperlipidemia. This year she has also had a complicated [id number] infection requiring hospitalization.  Normally, declines all vaccines Covid, Shingrix Mammogram -none in recent years  Health Maintenance  Topic Date Due   COVID-19 Vaccine (1) Never done   Zoster Vaccines- Shingrix (1 of 2) Never done   MAMMOGRAM  04/28/2016   PAP SMEAR-Modifier  09/25/2021   Fecal DNA (Cologuard)  11/04/2023   DTaP/Tdap/Td (3 - Td or Tdap) 10/28/2024   INFLUENZA VACCINE  Completed   Hepatitis C Screening  Completed   HIV Screening  Completed   HPV VACCINES  Aged Out    Immunization History  Administered Date(s) Administered   Influenza Nasal 06/20/2017   Influenza Whole 07/04/2008, 06/18/2009, 08/27/2010   Influenza, Seasonal, Injecte, Preservative Fre  09/03/2010, 06/22/2011, 06/27/2012   Influenza,inj,Quad PF,6+ Mos 07/21/2015, 07/06/2018, 08/10/2021   Influenza-Unspecified 06/27/2012, 07/17/2014, 07/19/2016, 07/21/2019, 07/17/2022   Td 05/10/2002   Tdap 10/28/2014   Patient Active Problem List   Diagnosis Date Noted   Chronic systolic CHF (congestive heart failure) ([place]) 07/15/2020    Priority: High   Interstitial lung disease ([place]) 04/18/2020    Priority: High   Pacemaker 10/01/2018    Priority: High   Complete heart block ([place]) s/p Pacemaker 02/26/2016    Priority: High   OSA (obstructive sleep apnea) 12/14/2021    Priority: Medium    Hyperlipidemia LDL goal <100 06/21/2007    Priority: Medium    GAD (generalized anxiety disorder) 06/05/2007    Priority: Medium    Essential hypertension 06/05/2007    Priority: Medium    COVID-19 virus infection 09/02/2022   Acute encephalopathy 09/02/2022   GERD without esophagitis 09/02/2022   Nocturnal hypoxemia 12/14/2021   GERD (gastroesophageal reflux disease) 07/15/2020   Generalized anxiety disorder [id number]   Diastolic dysfunction [id number]   Vitamin D deficiency 01/11/2018   Allergic angioedema 03/17/2016   Former tobacco use 08/28/2008   ALLERGIC RHINITIS 06/05/2007    Past Medical History:  Diagnosis Date   Chronic systolic CHF (congestive heart failure) ([place]) 07/15/2020   Complete heart block ([place]) 02/26/2016   s/p Pacemaker   Family history of ovarian cancer    Generalized anxiety disorder 07/15/2020   GERD (gastroesophageal reflux disease)    History of kidney stones    Hyperlipidemia    Hypertension    Interstitial lung disease (HCC)    Pollen allergies  Presence of permanent cardiac pacemaker    Pulmonary fibrosis ([place])    Squamous cell carcinoma 2014   skin cancer   Subclinical hyperthyroidism 06/17/2014    Past Surgical History:  Procedure Laterality Date   CESAREAN SECTION     COSMETIC SURGERY     skin cancer face   CYSTOSCOPY W/ URETERAL  STENT PLACEMENT Left 07/15/2020   Procedure: CYSTOSCOPY WITH RETROGRADE PYELOGRAM/URETERAL STENT PLACEMENT;  Surgeon: Abbie Sons, MD;  Location: [facility] ORS;  Service: Urology;  Laterality: Left;   CYSTOSCOPY/URETEROSCOPY/HOLMIUM LASER/STENT PLACEMENT Left 07/28/2020   Procedure: CYSTOSCOPY/URETEROSCOPY/HOLMIUM LASER/STENT Exchange;  Surgeon: Hollice Espy, MD;  Location: [facility];  Service: Urology;  Laterality: Left;   EP IMPLANTABLE DEVICE N/A 02/26/2016   Procedure: Pacemaker Implant;  Surgeon: Will Meredith Leeds, MD;  Location: [place] [facility];  Service: Cardiovascular;  Laterality: N/A;   TUBAL LIGATION      Family History  Problem Relation Age of Onset   Anxiety disorder Mother    Hypertension Mother    Ovarian cancer Mother 80   Cancer Maternal Aunt        breast   Heart failure Maternal Grandfather     Social History   Social History Narrative   Regular exercise-no    Past Medical History, Surgical History, Social History, Family History, Problem List, Medications, and Allergies have been reviewed and updated if relevant.  Review of Systems: Pertinent positives are listed above.  Otherwise, a full 14 point review of systems has been done in full and it is negative except where it is noted positive.  Objective:   There were no vitals taken for this visit. Ideal Body Weight:   No results found.    11/25/2021    9:10 AM 10/16/2020    8:12 AM 04/16/2019    8:49 AM 04/12/2018   10:42 AM 05/17/2017    2:49 PM  Depression screen PHQ 2/9  Decreased Interest 0 0 0 0 0  Down, Depressed, Hopeless 0 0 0 0 0  PHQ - 2 Score 0 0 0 0 0     GEN: well developed, well nourished, no acute distress Eyes: conjunctiva and lids normal, PERRLA, EOMI ENT: TM clear, nares clear, oral exam WNL Neck: supple, no lymphadenopathy, no thyromegaly, no JVD Pulm: clear to auscultation and percussion, respiratory effort normal CV: regular rate and rhythm, S1-S2, no murmur, rub or gallop, no  bruits Chest: no scars, masses, no lumps BREAST: breast exam declined GI: soft, non-tender; no hepatosplenomegaly, masses; active bowel sounds all quadrants GU: GU exam declined Lymph: no cervical, axillary or inguinal adenopathy MSK: gait normal, muscle tone and strength WNL, no joint swelling, effusions, discoloration, crepitus  SKIN: clear, good turgor, color WNL, no rashes, lesions, or ulcerations Neuro: normal mental status, normal strength, sensation, and motion Psych: alert; oriented to person, place and time, normally interactive and not anxious or depressed in appearance.   All labs reviewed with patient. Results for orders placed or performed in visit on [id number]  Basic metabolic panel  Result Value Ref Range   Sodium 141 135 - 145 mEq/L   Potassium 4.0 3.5 - 5.1 mEq/L   Chloride 101 96 - 112 mEq/L   CO2 31 19 - 32 mEq/L   Glucose, Bld 98 70 - 99 mg/dL   BUN 15 6 - 23 mg/dL   Creatinine, Ser 0.64 0.40 - 1.20 mg/dL   GFR 96.36 >60.00 mL/min   Calcium 9.9 8.4 - 10.5 mg/dL  CBC with Differential/Platelet  Result Value Ref Range   WBC 6.1 4.0 - 10.5 K/uL   RBC 4.53 3.87 - 5.11 Mil/uL   Hemoglobin 14.3 12.0 - 15.0 g/dL   HCT 42.2 36.0 - 46.0 %   MCV 93.0 78.0 - 100.0 fl   MCHC 33.9 30.0 - 36.0 g/dL   RDW 12.8 11.5 - 15.5 %   Platelets 222.0 150.0 - 400.0 K/uL   Neutrophils Relative % 47.5 43.0 - 77.0 %   Lymphocytes Relative 42.8 12.0 - 46.0 %   Monocytes Relative 7.6 3.0 - 12.0 %   Eosinophils Relative 1.5 0.0 - 5.0 %   Basophils Relative 0.6 0.0 - 3.0 %   Neutro Abs 2.9 1.4 - 7.7 K/uL   Lymphs Abs 2.6 0.7 - 4.0 K/uL   Monocytes Absolute 0.5 0.1 - 1.0 K/uL   Eosinophils Absolute 0.1 0.0 - 0.7 K/uL   Basophils Absolute 0.0 0.0 - 0.1 K/uL  Hepatic function panel  Result Value Ref Range   Total Bilirubin 0.6 0.2 - 1.2 mg/dL   Bilirubin, Direct 0.1 0.0 - 0.3 mg/dL   Alkaline Phosphatase 51 39 - 117 U/L   AST 21 0 - 37 U/L   ALT 21 0 - 35 U/L   Total Protein 6.9  6.0 - 8.3 g/dL   Albumin 4.3 3.5 - 5.2 g/dL  Hemoglobin A1c  Result Value Ref Range   Hgb A1c MFr Bld 5.9 4.6 - 6.5 %  Lipid panel  Result Value Ref Range   Cholesterol 232 (H) 0 - 200 mg/dL   Triglycerides 251.0 (H) 0.0 - 149.0 mg/dL   HDL 52.00 >39.00 mg/dL   VLDL 50.2 (H) 0.0 - 40.0 mg/dL   Total CHOL/HDL Ratio 4    NonHDL 179.64   Hepatitis C antibody  Result Value Ref Range   Hepatitis C Ab NON-REACTIVE NON-REACTIVE  VITAMIN D 25 Hydroxy (Vit-D Deficiency, Fractures)  Result Value Ref Range   VITD 27.74 (L) 30.00 - 100.00 ng/mL  TSH  Result Value Ref Range   TSH 2.73 0.35 - 5.50 uIU/mL  LDL cholesterol, direct  Result Value Ref Range   Direct LDL 156.0 mg/dL   No results found.  Assessment and Plan:     ICD-10-CM   1. Healthcare maintenance  Z00.00       Health Maintenance Exam: The patient's preventative maintenance and recommended screening tests for an annual wellness exam were reviewed in full today. Brought up to date unless services declined.  Counselled on the importance of diet, exercise, and its role in overall health and mortality. The patient's FH and SH was reviewed, including their home life, tobacco status, and drug and alcohol status.  Follow-up in 1 year for physical exam or additional follow-up below.  Disposition: No follow-ups on file.  Future Appointments  Date Time Provider [place]  12/08/2022  9:00 AM Owens Loffler, MD [facility]  12/09/2022  7:45 AM [facility] DEVICE REMOTES [facility]  12/15/2022 10:00 AM Tyler Pita, MD [facility] None  03/10/2023  7:45 AM [facility] DEVICE REMOTES [facility]  06/09/2023  7:45 AM [facility] DEVICE REMOTES [facility]    No orders of the defined types were placed in this encounter.  There are no discontinued medications. No orders of the defined types were placed in this encounter.   Signed,  Maud Deed. Richy Spradley, MD   Allergies as  of 12/08/2022       Reactions   Codeine Hives, Itching   Sulfonamide Derivatives Hives  Paroxetine Other (See Comments)   unknown   Lisinopril Rash   Losartan Potassium Other (See Comments)   Leg felt heavy   Neomycin-polymyxin-dexameth Itching, Swelling, Other (See Comments)   Ey drop/Swelling and itching of eye    Oxycodone-acetaminophen Other (See Comments)   Mild SOB, feels funny   Sulfa Antibiotics Hives, Rash           Medication List        Accurate as of December 07, 2022 10:59 AM. If you have any questions, ask your nurse or doctor.          acetaminophen 500 MG tablet Commonly known as: TYLENOL Take 1,000 mg by mouth every 6 (six) hours as needed. As needed   albuterol 108 (90 Base) MCG/ACT inhaler Commonly known as: VENTOLIN HFA Inhale 2 puffs into the lungs every 6 (six) hours as needed for wheezing or shortness of breath.   carvedilol 25 MG tablet Commonly known as: COREG TAKE ONE TABLET BY MOUTH TWICE A DAY WITH MEALS   diazepam 5 MG tablet Commonly known as: VALIUM TAKE ONE TABLET BY MOUTH EVERY 8 HOURS AS NEEDED FOR ANXIETY   famotidine 20 MG tablet Commonly known as: PEPCID Take 1 tablet (20 mg total) by mouth daily.   minoxidil 2.5 MG tablet Commonly known as: LONITEN Take 1 tablet (2.5 mg total) by mouth daily.   spironolactone 25 MG tablet Commonly known as: ALDACTONE TAKE ONE TABLET BY MOUTH DAILY

## 2022-12-08 ENCOUNTER — Ambulatory Visit (INDEPENDENT_AMBULATORY_CARE_PROVIDER_SITE_OTHER): Payer: BC Managed Care – PPO | Admitting: Family Medicine

## 2022-12-08 ENCOUNTER — Encounter: Payer: Self-pay | Admitting: Family Medicine

## 2022-12-08 VITALS — BP 122/80 | HR 81 | Temp 97.8°F | Ht 62.75 in | Wt 214.2 lb

## 2022-12-08 DIAGNOSIS — F331 Major depressive disorder, recurrent, moderate: Secondary | ICD-10-CM

## 2022-12-08 DIAGNOSIS — Z0000 Encounter for general adult medical examination without abnormal findings: Secondary | ICD-10-CM

## 2022-12-08 DIAGNOSIS — F4323 Adjustment disorder with mixed anxiety and depressed mood: Secondary | ICD-10-CM | POA: Diagnosis not present

## 2022-12-08 DIAGNOSIS — Z23 Encounter for immunization: Secondary | ICD-10-CM

## 2022-12-08 DIAGNOSIS — J9601 Acute respiratory failure with hypoxia: Secondary | ICD-10-CM | POA: Diagnosis not present

## 2022-12-08 DIAGNOSIS — I5189 Other ill-defined heart diseases: Secondary | ICD-10-CM | POA: Diagnosis not present

## 2022-12-09 ENCOUNTER — Ambulatory Visit (INDEPENDENT_AMBULATORY_CARE_PROVIDER_SITE_OTHER): Payer: BC Managed Care – PPO

## 2022-12-09 DIAGNOSIS — I442 Atrioventricular block, complete: Secondary | ICD-10-CM

## 2022-12-09 LAB — CUP PACEART REMOTE DEVICE CHECK
Battery Remaining Longevity: 29 mo
Battery Remaining Percentage: 24 %
Battery Voltage: 2.89 V
Brady Statistic AP VP Percent: 1 %
Brady Statistic AP VS Percent: 1 %
Brady Statistic AS VP Percent: 99 %
Brady Statistic AS VS Percent: 1 %
Brady Statistic RA Percent Paced: 1 %
Brady Statistic RV Percent Paced: 99 %
Date Time Interrogation Session: 20240321020013
Implantable Lead Connection Status: 753985
Implantable Lead Connection Status: 753985
Implantable Lead Implant Date: 20170608
Implantable Lead Implant Date: 20170608
Implantable Lead Location: 753859
Implantable Lead Location: 753860
Implantable Pulse Generator Implant Date: 20170608
Lead Channel Impedance Value: 430 Ohm
Lead Channel Impedance Value: 430 Ohm
Lead Channel Pacing Threshold Amplitude: 0.75 V
Lead Channel Pacing Threshold Amplitude: 1 V
Lead Channel Pacing Threshold Pulse Width: 0.4 ms
Lead Channel Pacing Threshold Pulse Width: 0.4 ms
Lead Channel Sensing Intrinsic Amplitude: 4 mV
Lead Channel Setting Pacing Amplitude: 1.25 V
Lead Channel Setting Pacing Amplitude: 2 V
Lead Channel Setting Pacing Pulse Width: 0.4 ms
Lead Channel Setting Sensing Sensitivity: 2.5 mV
Pulse Gen Model: 2272
Pulse Gen Serial Number: 7903881

## 2022-12-15 ENCOUNTER — Telehealth: Payer: Self-pay | Admitting: Pulmonary Disease

## 2022-12-15 ENCOUNTER — Encounter: Payer: Self-pay | Admitting: Pulmonary Disease

## 2022-12-15 ENCOUNTER — Ambulatory Visit (INDEPENDENT_AMBULATORY_CARE_PROVIDER_SITE_OTHER): Payer: BC Managed Care – PPO | Admitting: Pulmonary Disease

## 2022-12-15 VITALS — BP 130/80 | HR 80 | Temp 97.7°F | Ht 62.75 in | Wt 215.0 lb

## 2022-12-15 DIAGNOSIS — J849 Interstitial pulmonary disease, unspecified: Secondary | ICD-10-CM

## 2022-12-15 DIAGNOSIS — G4734 Idiopathic sleep related nonobstructive alveolar hypoventilation: Secondary | ICD-10-CM

## 2022-12-15 DIAGNOSIS — R059 Cough, unspecified: Secondary | ICD-10-CM

## 2022-12-15 DIAGNOSIS — R0602 Shortness of breath: Secondary | ICD-10-CM | POA: Diagnosis not present

## 2022-12-15 LAB — NITRIC OXIDE: Nitric Oxide: 27

## 2022-12-15 MED ORDER — TRELEGY ELLIPTA 100-62.5-25 MCG/ACT IN AEPB: 1.0000 | INHALATION_SPRAY | Freq: Every day | RESPIRATORY_TRACT | 11 refills | Status: DC

## 2022-12-15 MED ORDER — TRELEGY ELLIPTA 100-62.5-25 MCG/ACT IN AEPB: 1.0000 | INHALATION_SPRAY | Freq: Every day | RESPIRATORY_TRACT | 0 refills | Status: DC

## 2022-12-15 NOTE — Telephone Encounter (Signed)
I spoke with the patient. She will reach out to her pharmacy (she does not use [employer] for her prescriptions) to see what a cheaper alternative is. She will call us back after she speaks with them.

## 2022-12-15 NOTE — Telephone Encounter (Signed)
Needs to check with ins for alternatives

## 2022-12-15 NOTE — Progress Notes (Signed)
Subjective:    Patient ID: Anne Rush, female    DOB: 05-13-1961, 62 y.o.   MRN: [id number] Patient Care Team: [name], MD as PCP - General (Family Medicine) Regan Lemming, MD as PCP - Electrophysiology (Cardiology) O'Neal, Ronnald Ramp, MD as PCP - Cardiology (Cardiology)  Chief Complaint  Patient presents with   Follow-up    SOB with exertion. Occasional wheezing. No cough. Had Covid in 08/2022.    HPI Patient is a 62 year old former smoker (quit 2012, 15-pack-year history) who presents for follow-up of predominant interstitial lung disease and fatigue with shortness of breath.  This is a scheduled visit.  She was last seen here on 19 August 2022.  At that time we ordered PFTs due to her persistent complaint of dyspnea.  She did not get this done.  This will have to be rescheduled.  She is very challenging to manage as she will not follow recommendations bided to her.  She has been erratic and using her oxygen at nighttime and she does have severe sleep apnea for which she will not wear or even attempt to wear CPAP and associated sleep-related hypoxia/hypoventilation.  It is recommended that she undergo a formal titration study however she continues to refuse this.  She does not meet criteria for inspire device and she declined ENT referral for evaluation. I have advised her that untreated sleep apnea will be exceedingly detrimental to her.   She did have COVID-07 September 2022.  Continues to have dyspnea on exertion and fatigue.  Notes occasional wheezing.  No cough.  She states she is compliant with oxygen at 2 L/min nocturnally.  She has had no orthopnea or paroxysmal nocturnal dyspnea.  No lower extremity edema.  Calf tenderness.    DATA 09/01/2020 spirometry with DLCO: FEV1 2.54 L or 81% predicted, FVC 3.26 L or 78% predicted, FEV1/FVC 79% diffusion capacity minimally reduced, ask for alveolar volume. 09/01/2020 high-resolution CT chest: Interstitial lung disease  without frank honeycombing slight upper lung predominance.  Shall considerations include chronic hypersensitivity pneumonitis fibrotic NSIP, postinflammatory fibrosis NOT UIP. 12/15/2020 spirometry with DLCO: FEV1 2.11 L or 83% predicted, FVC 2.53 L or 78% predicted, FEV1/FVC 83%.  Diffusion capacity minimally reduced and corrects for alveolar volume. 04/09/2021 PFTs: FEV1 2.37 L or 94% predicted, FVC 2.76 L or 85% predicted, FEV1/FVC 86%, no postbronchodilator study was performed.  Lung volumes normal, ERV 6% consistent with obesity.  Diffusion capacity minimally decreased. 05/06/2021 echocardiogram: LVEF 50 to 55%, mild dilation of the left ventricle.  Right ventricular systolic function normal.  No valvular abnormalities. 09/29/2021 Home sleep study: SEVERE sleep apnea with AHI of 45.3 and O2 sat as low as 68%.  She also had oxygen desaturation in the absence of obstructive respiratory events raising concern for sleep-related hypoxia hypoventilation. 11/19/2021 high-resolution chest CT: Upper lobe predominant fibrotic interstitial lung disease associated groundglass appears slightly decreased when compared to most recent exam dated September 01, 2020.  Findings are likely related to smoking-related interstitial lung disease, fibrotic HP less likely due to lack of air trapping.  Suggestive of alternative diagnosis NOT UIP. 11/24/2021 overnight oximetry: Significant oxygen desaturations to as low as 68%.  Review of Systems A 10 point review of systems was performed and it is as noted above otherwise negative.  Patient Active Problem List   Diagnosis Date Noted   COVID-19 virus infection 09/02/2022   Acute encephalopathy 09/02/2022   GERD without esophagitis 09/02/2022   OSA (obstructive sleep apnea) 12/14/2021  Nocturnal hypoxemia 12/14/2021   GERD (gastroesophageal reflux disease) 07/15/2020   Generalized anxiety disorder 07/15/2020   Chronic systolic CHF (congestive heart failure) (HCC)  07/15/2020   Interstitial lung disease (HCC) 04/18/2020   Diastolic dysfunction 04/02/2020   Pacemaker 10/01/2018   Vitamin D deficiency 01/11/2018   Allergic angioedema 03/17/2016   Complete heart block (HCC) s/p Pacemaker 02/26/2016   Former tobacco use 08/28/2008   Hyperlipidemia LDL goal <100 06/21/2007   GAD (generalized anxiety disorder) 06/05/2007   Essential hypertension 06/05/2007   ALLERGIC RHINITIS 06/05/2007   Social History   Tobacco Use   Smoking status: Former    Packs/day: 0.50    Years: 30.00    Additional pack years: 0.00    Total pack years: 15.00    Types: Cigarettes    Quit date: 2012    Years since quitting: 12.2   Smokeless tobacco: Never  Substance Use Topics   Alcohol use: No   Allergies  Allergen Reactions   Codeine Hives and Itching   Sulfonamide Derivatives Hives   Paroxetine Other (See Comments)    unknown   Lisinopril Rash   Losartan Potassium Other (See Comments)    Leg felt heavy   Neomycin-Polymyxin-Dexameth Itching, Swelling and Other (See Comments)    Ey drop/Swelling and itching of eye    Oxycodone-Acetaminophen Other (See Comments)    Mild SOB, feels funny   Sulfa Antibiotics Hives and Rash        Current Meds  Medication Sig   acetaminophen (TYLENOL) 500 MG tablet Take 1,000 mg by mouth every 6 (six) hours as needed. As needed   albuterol (VENTOLIN HFA) 108 (90 Base) MCG/ACT inhaler Inhale 2 puffs into the lungs every 6 (six) hours as needed for wheezing or shortness of breath.   carvedilol (COREG) 25 MG tablet TAKE ONE TABLET BY MOUTH TWICE A DAY WITH MEALS   diazepam (VALIUM) 5 MG tablet TAKE ONE TABLET BY MOUTH EVERY 8 HOURS AS NEEDED FOR ANXIETY   famotidine (PEPCID) 20 MG tablet Take 1 tablet (20 mg total) by mouth daily.   spironolactone (ALDACTONE) 25 MG tablet TAKE ONE TABLET BY MOUTH DAILY   Immunization History  Administered Date(s) Administered   Influenza Nasal 06/20/2017   Influenza Whole 07/04/2008,  06/18/2009, 08/27/2010   Influenza, Seasonal, Injecte, Preservative Fre 09/03/2010, 06/22/2011, 06/27/2012   Influenza,inj,Quad PF,6+ Mos 07/21/2015, 07/06/2018, 08/10/2021   Influenza-Unspecified 06/27/2012, 07/17/2014, 07/19/2016, 07/21/2019, 07/17/2022   Td 05/10/2002   Tdap 10/28/2014   Zoster Recombinat (Shingrix) 12/08/2022       Objective:   Physical Exam BP 130/80 (BP Location: Left Arm, Cuff Size: Large)   Pulse 80   Temp 97.7 F (36.5 C)   Ht 5' 2.75" (1.594 m)   Wt 215 lb (97.5 kg)   SpO2 100%   BMI 38.39 kg/m   SpO2: 100 % O2 Device: None (Room air)  GENERAL: Obese woman, no acute distress.  Fully ambulatory.  No conversational dyspnea.  HEAD: Normocephalic, atraumatic.  EYES: Pupils equal, round, reactive to light.  No scleral icterus.  Mild periorbital edema/puffiness. MOUTH: Dentition intact, oral mucosa moist.  No thrush. NECK: Supple. No thyromegaly. Trachea midline. No JVD.  No adenopathy. PULMONARY: Good air entry bilaterally.  No adventitious sounds.  Overt crackles. CARDIOVASCULAR: S1 and S2. Regular rate and rhythm.  Pacer dependent.  No murmurs. ABDOMEN: Obese otherwise benign. MUSCULOSKELETAL: No joint deformity,+ clubbing (mild), no edema.  NEUROLOGIC: No focal deficit, no gait disturbance, speech is fluent. SKIN: Intact,warm,dry.  PSYCH: Mood and behavior normal.   Lab Results  Component Value Date   NITRICOXIDE 27 12/15/2022       Assessment & Plan:     ICD-10-CM   1. SOB (shortness of breath)  R06.02 Nitric oxide   Trial of Trelegy Ellipta 100 Samples provided for the patient Reassess with PFTs May need to reassess with echocardiogram    2. Nocturnal hypoxemia  G47.34 Pulse oximetry, overnight   Reassess oxygen supplementation Overnight oximetry on current oxygen liter flow Sleep-related hypoxia/hypoventilation    3. Interstitial lung disease (HCC)  J84.9    Consistent with respiratory bronchiolitis NOT UIP    4. Cough,  unspecified type  R05.9    Per patient this has resolved     Orders Placed This Encounter  Procedures   Pulse oximetry, overnight    2L O2 and [employer]    Standing Status:   Future    Standing Expiration Date:   12/15/2023   Nitric oxide   Meds ordered this encounter  Medications   Fluticasone-Umeclidin-Vilant (TRELEGY ELLIPTA) 100-62.5-25 MCG/ACT AEPB    Sig: Inhale 1 puff into the lungs daily.    Dispense:  14 each    Refill:  0    Order Specific Question:   Lot Number?    Answer:   [id number]    Order Specific Question:   Expiration Date?    Answer:   02/19/2024    Order Specific Question:   Quantity    Answer:   1   Fluticasone-Umeclidin-Vilant (TRELEGY ELLIPTA) 100-62.5-25 MCG/ACT AEPB    Sig: Inhale 1 puff into the lungs daily.    Dispense:  28 each    Refill:  11   The patient continues to be reluctant to follow recommendations.  We will reassess her 3 months time.  In the interim we have ordered studies to reassess her pulmonary function.  He is to contact us prior to follow-up should any new respiratory issues ensue.  Gailen Shelter, MD Advanced Bronchoscopy PCCM [facility]    *This note was dictated using voice recognition software/Dragon.  Despite best efforts to proofread, errors can occur which can change the meaning. Any transcriptional errors that result from this process are unintentional and may not be fully corrected at the time of dictation.

## 2022-12-15 NOTE — Patient Instructions (Signed)
We are giving you a trial of an inhaler called Trelegy Ellipta this is 1 puff daily.  You have enough for a 2-week dose on the sample.  A prescription will be sent to your pharmacy.  Make sure you rinse your mouth well after you use it.  You can still use your albuterol as needed during the day if you notice any shortness of breath or wheezing.  We are going to reschedule your breathing tests.  We are going to check your oxygen level on the current oxygen that you are on at nighttime.  We will see you in follow-up in 3 months time call sooner should any new problems arise.

## 2022-12-20 NOTE — Telephone Encounter (Signed)
Walking is not going to affect the nitric oxide levels. She cannot judge how she is going to react to a medication how her father reacted. We will need to determine if she needs to keep following up with me because she has turned down everything I have tried to help her.

## 2022-12-20 NOTE — Telephone Encounter (Signed)
I spoke with the patient. She said her pharmacy said they could not help her on the cost of the inhaler.  She then said she does not want an inhaler with a steroid in it, because her father had a lot of problems with inhalers and thrush. She said she feels like her nitric oxide number was up because she had just walked in from the parking lot and she was in a hurry because of the rain. She said she normally does not do a lot of walking like that.  She has not tried the Trelegy sample inhaler you gave her. She wants to know if you are ok with her not using the Trelegy and just continuing with her Albuterol inhaler until she has her PFT and follow up appt with you?

## 2022-12-21 NOTE — Telephone Encounter (Signed)
Per Dr. Patsey Berthold, ok to hold off on inhalers until after her PFT.  ATC patient. LVM for patient to return my call.

## 2022-12-22 NOTE — Telephone Encounter (Signed)
I have notified the patient. Nothing further needed. 

## 2023-01-04 NOTE — Progress Notes (Unsigned)
    Anne Sheard T. Abiel Antrim, MD, CAQ Sports Medicine [facility] at [facility] [address] [place], [address]  Phone: [phone]  FAX: [phone]  Anne Rush - 62 y.o. female  MRN [id number]  Date of Birth: Jan 21, 1961  Date: 01/05/2023  PCP: [name], MD  Referral: [name], MD  No chief complaint on file.  Subjective:   Anne Rush is a 62 y.o. very pleasant female patient with There is no height or weight on file to calculate BMI. who presents with the following:  Very well-known patient who presents with some ongoing health concerns today.    Review of Systems is noted in the HPI, as appropriate  Objective:   There were no vitals taken for this visit.  GEN: No acute distress; alert,appropriate. PULM: Breathing comfortably in no respiratory distress PSYCH: Normally interactive.   Laboratory and Imaging Data:  Assessment and Plan:   ***

## 2023-01-05 ENCOUNTER — Ambulatory Visit (INDEPENDENT_AMBULATORY_CARE_PROVIDER_SITE_OTHER): Payer: BC Managed Care – PPO | Admitting: Family Medicine

## 2023-01-05 ENCOUNTER — Encounter: Payer: Self-pay | Admitting: Family Medicine

## 2023-01-05 VITALS — BP 130/82 | HR 82 | Temp 98.3°F | Ht 62.75 in | Wt 213.4 lb

## 2023-01-05 DIAGNOSIS — J849 Interstitial pulmonary disease, unspecified: Secondary | ICD-10-CM | POA: Diagnosis not present

## 2023-01-05 DIAGNOSIS — I5022 Chronic systolic (congestive) heart failure: Secondary | ICD-10-CM | POA: Diagnosis not present

## 2023-01-05 DIAGNOSIS — I10 Essential (primary) hypertension: Secondary | ICD-10-CM

## 2023-01-05 DIAGNOSIS — F411 Generalized anxiety disorder: Secondary | ICD-10-CM

## 2023-01-05 MED ORDER — FLUTICASONE-SALMETEROL 250-50 MCG/ACT IN AEPB: 1.0000 | INHALATION_SPRAY | Freq: Two times a day (BID) | RESPIRATORY_TRACT | 3 refills | Status: DC

## 2023-01-08 DIAGNOSIS — I5189 Other ill-defined heart diseases: Secondary | ICD-10-CM | POA: Diagnosis not present

## 2023-01-08 DIAGNOSIS — J9601 Acute respiratory failure with hypoxia: Secondary | ICD-10-CM | POA: Diagnosis not present

## 2023-01-10 DIAGNOSIS — R0902 Hypoxemia: Secondary | ICD-10-CM | POA: Diagnosis not present

## 2023-01-11 ENCOUNTER — Telehealth: Payer: Self-pay | Admitting: Family Medicine

## 2023-01-11 NOTE — Telephone Encounter (Signed)
No, I do not see how that would be possible.

## 2023-01-11 NOTE — Telephone Encounter (Signed)
[place] notified as instructed by telephone.  She states this is the only thing she can attribute it to.  She states when the ringing happens she checks her BP and her reading have all been normal.  She is asking if she can stop the Advair for a few days to see if the ringing goes away.  Please advise.

## 2023-01-11 NOTE — Telephone Encounter (Signed)
Anne Rush notified via telephone that she can stop the Advair for a few days to see if the ringing in her ears goes away.  Patient will let us know how that does.

## 2023-01-11 NOTE — Telephone Encounter (Signed)
Patient was prescribed  fluticasone-salmeterol (ADVAIR) 250-50 MCG/ACT AEPB when she was seen on 01/05/2023.She called in today stating that she has been having a constant ringing in her ears since she started to use this inhaler.She would like to know if the inhaler could possibly have something to do with it? Please advised.

## 2023-01-12 NOTE — Progress Notes (Signed)
Remote pacemaker transmission.   

## 2023-01-14 NOTE — Telephone Encounter (Signed)
Pt called back to let Dr. Patsy Lager know that Wed morning, 4/24, her left ear stopped ringing, then on Thurs afternoon, 4/25, her right ear stopped ringing. Pt states after taking the albuterol (VENTOLIN HFA) 108 (90 Base) MCG/ACT inhaler on Wed night, she slept fine. Pt states as of yesterday and today, the ringing hasn't occurred. Pt states she plans to continue to use albuterol (VENTOLIN HFA) 108 (90 Base) MCG/ACT inhaler, as needed until she sees her pulmonologist next month. Pt states if Dr. Patsy Lager wants to see her again, she will come in. Call back # [phone]

## 2023-01-14 NOTE — Telephone Encounter (Signed)
Geraline notified as instructed by telephone. 

## 2023-01-16 NOTE — Progress Notes (Unsigned)
    Kamorah Nevils T. Shloimy Michalski, MD, CAQ Sports Medicine [facility] [address] [place], [address]  Phone: [phone]  FAX: [phone]  Anne Rush - 62 y.o. female  MRN [id number]  Date of Birth: [birth year]  Date: 01/17/2023  PCP: [name], MD  Referral: [name], MD  No chief complaint on file.  Subjective:   Anne Rush is a 62 y.o. very pleasant female patient with There is no height or weight on file to calculate BMI. who presents with the following:  Patient presents with some ongoing sinus pressure and pain on the right side.  She also has been having some intermittent ringing in her ears.    Review of Systems is noted in the HPI, as appropriate  Objective:   There were no vitals taken for this visit.  GEN: No acute distress; alert,appropriate. PULM: Breathing comfortably in no respiratory distress PSYCH: Normally interactive.   Laboratory and Imaging Data:  Assessment and Plan:   ***

## 2023-01-17 ENCOUNTER — Ambulatory Visit (INDEPENDENT_AMBULATORY_CARE_PROVIDER_SITE_OTHER): Payer: BC Managed Care – PPO | Admitting: Family Medicine

## 2023-01-17 ENCOUNTER — Telehealth: Payer: Self-pay | Admitting: Family Medicine

## 2023-01-17 ENCOUNTER — Encounter: Payer: Self-pay | Admitting: Family Medicine

## 2023-01-17 VITALS — BP 128/78 | HR 73 | Temp 97.8°F | Ht 62.75 in | Wt 212.4 lb

## 2023-01-17 DIAGNOSIS — J0100 Acute maxillary sinusitis, unspecified: Secondary | ICD-10-CM | POA: Diagnosis not present

## 2023-01-17 DIAGNOSIS — H9313 Tinnitus, bilateral: Secondary | ICD-10-CM | POA: Diagnosis not present

## 2023-01-17 DIAGNOSIS — J849 Interstitial pulmonary disease, unspecified: Secondary | ICD-10-CM

## 2023-01-17 MED ORDER — AMOXICILLIN-POT CLAVULANATE 875-125 MG PO TABS: 1.0000 | ORAL_TABLET | Freq: Two times a day (BID) | ORAL | 0 refills | Status: DC

## 2023-01-17 MED ORDER — AMOXICILLIN-POT CLAVULANATE 250-62.5 MG/5ML PO SUSR: ORAL | 0 refills | Status: DC

## 2023-01-17 NOTE — Telephone Encounter (Signed)
amoxicillin-clavulanate (AUGMENTIN) 250-62.5 MG/5ML suspension  Liquid is pricey so patient agreed to tablets, but the conversion is different and need MD approval, Augmentin pill 875   [facility] [phone]  Misty Stanley

## 2023-01-17 NOTE — Telephone Encounter (Signed)
Augmentin 875 mg prescription sent to [facility] as instructed by Dr. Patsy Lager.   Shabree notified of this via telephone.

## 2023-01-17 NOTE — Telephone Encounter (Signed)
OK, convert to pills  Augmentin 875 mg, 1 po BID, #20

## 2023-01-20 ENCOUNTER — Encounter: Payer: Self-pay | Admitting: Family Medicine

## 2023-01-24 ENCOUNTER — Encounter: Payer: Self-pay | Admitting: Family Medicine

## 2023-01-24 MED ORDER — PREDNISONE 20 MG PO TABS: 40.0000 mg | ORAL_TABLET | Freq: Every day | ORAL | 0 refills | Status: DC

## 2023-01-27 ENCOUNTER — Encounter: Payer: Self-pay | Admitting: Family Medicine

## 2023-01-27 DIAGNOSIS — J0100 Acute maxillary sinusitis, unspecified: Secondary | ICD-10-CM

## 2023-01-27 DIAGNOSIS — R519 Headache, unspecified: Secondary | ICD-10-CM

## 2023-01-27 DIAGNOSIS — J3489 Other specified disorders of nose and nasal sinuses: Secondary | ICD-10-CM

## 2023-02-03 DIAGNOSIS — R0981 Nasal congestion: Secondary | ICD-10-CM | POA: Diagnosis not present

## 2023-02-03 DIAGNOSIS — J309 Allergic rhinitis, unspecified: Secondary | ICD-10-CM | POA: Diagnosis not present

## 2023-02-03 DIAGNOSIS — J301 Allergic rhinitis due to pollen: Secondary | ICD-10-CM | POA: Diagnosis not present

## 2023-02-03 DIAGNOSIS — J329 Chronic sinusitis, unspecified: Secondary | ICD-10-CM | POA: Diagnosis not present

## 2023-02-06 ENCOUNTER — Other Ambulatory Visit: Payer: Self-pay | Admitting: Family Medicine

## 2023-02-07 ENCOUNTER — Ambulatory Visit (INDEPENDENT_AMBULATORY_CARE_PROVIDER_SITE_OTHER): Payer: BC Managed Care – PPO | Admitting: Internal Medicine

## 2023-02-07 ENCOUNTER — Other Ambulatory Visit: Payer: Self-pay

## 2023-02-07 DIAGNOSIS — J849 Interstitial pulmonary disease, unspecified: Secondary | ICD-10-CM

## 2023-02-07 DIAGNOSIS — I5189 Other ill-defined heart diseases: Secondary | ICD-10-CM | POA: Diagnosis not present

## 2023-02-07 DIAGNOSIS — J9601 Acute respiratory failure with hypoxia: Secondary | ICD-10-CM | POA: Diagnosis not present

## 2023-02-07 LAB — PULMONARY FUNCTION TEST
DL/VA % pred: 120 %
DL/VA: 5.12 ml/min/mmHg/L
DLCO cor % pred: 103 %
DLCO cor: 20.27 ml/min/mmHg
DLCO unc % pred: 103 %
DLCO unc: 20.27 ml/min/mmHg
FEF 25-75 Post: 2.27 L/sec
FEF 25-75 Pre: 2.34 L/sec
FEF2575-%Change-Post: -2 %
FEF2575-%Pred-Post: 99 %
FEF2575-%Pred-Pre: 102 %
FEV1-%Change-Post: -1 %
FEV1-%Pred-Post: 90 %
FEV1-%Pred-Pre: 91 %
FEV1-Post: 2.22 L
FEV1-Pre: 2.26 L
FEV1FVC-%Change-Post: 3 %
FEV1FVC-%Pred-Pre: 104 %
FEV6-%Change-Post: -5 %
FEV6-%Pred-Post: 85 %
FEV6-%Pred-Pre: 89 %
FEV6-Post: 2.62 L
FEV6-Pre: 2.76 L
FEV6FVC-%Pred-Post: 103 %
FEV6FVC-%Pred-Pre: 103 %
FVC-%Change-Post: -5 %
FVC-%Pred-Post: 82 %
FVC-%Pred-Pre: 86 %
FVC-Post: 2.62 L
FVC-Pre: 2.76 L
Post FEV1/FVC ratio: 85 %
Post FEV6/FVC ratio: 100 %
Pre FEV1/FVC ratio: 82 %
Pre FEV6/FVC Ratio: 100 %
RV % pred: 96 %
RV: 1.87 L
TLC % pred: 91 %
TLC: 4.49 L

## 2023-02-07 NOTE — Patient Instructions (Signed)
Full PFT Performed Today  

## 2023-02-07 NOTE — Progress Notes (Signed)
Full PFT Performed Today  

## 2023-02-08 ENCOUNTER — Ambulatory Visit (INDEPENDENT_AMBULATORY_CARE_PROVIDER_SITE_OTHER): Payer: BC Managed Care – PPO | Admitting: *Deleted

## 2023-02-08 DIAGNOSIS — Z23 Encounter for immunization: Secondary | ICD-10-CM

## 2023-02-08 NOTE — Progress Notes (Signed)
Per orders of Dr. Bedsole, injection of shingles vaccine given by Teandra Harlan. Patient tolerated injection well.  

## 2023-02-11 ENCOUNTER — Telehealth: Payer: Self-pay

## 2023-02-11 NOTE — Telephone Encounter (Signed)
ONO reviewed by Dr. Jayme Cloud- qualifies for 2L QHS.  Lm for patient.

## 2023-02-15 NOTE — Telephone Encounter (Signed)
Spoke to patient and relayed below message/recommendations.  She will continue with 2L QHS. Nothing further needed.

## 2023-02-15 NOTE — Telephone Encounter (Signed)
Returning call.

## 2023-02-15 NOTE — Telephone Encounter (Signed)
Lm x2 for patient.  Will try once more due to nature of call.  

## 2023-02-16 NOTE — Progress Notes (Unsigned)
[name]      History     62 y/o F, former smoker, who presented to [facility] on 7/13 with worsening shortness of breath.    The patient reports her increase in shortness of breath began around the first of July.  She had noted her oxygen levels to be in the high 80's at home.  She reports progressively increased shortness of breath, dyspnea with exertion and difficulty walking a flight of stairs.  She has also noted a tightness in the middle of her chest.  She has not had a COVID vaccine.     The patient is a Conservator, museum/gallery.  She is a former smoker quit 2012 after diagnosis of ILD.  She was previously followed by Pulmonary at [facility] (Dr. Ramond Dial, last seen in 2016) for ground glass opacities but has not seen them in several years due to financial constraints. She was supposed to have a lung biopsy. Her symptoms at the time were thought to be RBILD.  At that time she quit smoking and her FEV1 increased from 85 to >90 and infiltrates improved on CT.    ER evaluation included a CTA chest which was negative for PE but showed worsening ILD from her prior CT scan.  Troponin flat.     PCCM consulted for pulmonary evaluation.   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  Since it started 1 month ago is getting worse.  There is episodic dyspnea.  Severity is listed below.  She also has a cough that she reports specifically started on Monday, March 31, 2020.  Since it started and with steroids it is better.  It is mild in intensity.  She does bring up some phlegm that is green.  A separate brown speckles.  There is no hemoptysis.  Cough does not get worse when she lies down.  Does not affect her voice.  Does not clear her throat.  No tickle.  All the symptoms started  approximately 5 weeks before admission.        Past Medical History : Positive for heart failure.  Third-degree heart block she has a pacer.  This has been going on for several years.  Currently ejection fraction reported as slightly low normal but according to Dr. Rito Ehrlich and the hospitalist cardiology reviewed the echo again and feel the ejection fraction is normal.  Current BNP is normal.  She has obesity.  Otherwise negative past medical history.  No COPD or asthma.  No collagen vascular disease no scleroderma no lupus no polymyositis.  Autoimmune panel is all negative.  No stroke or thyroid disease.  No hepatitis.  No history of blood clot.     ROS: Positive for dyspnea.  Was stable dyspnea all the way from 2012 up until recently 5 weeks ago.  There is associated with fatigue and arthralgia.  No dysphagia.  No acid reflux.  No vomiting no nausea.     FAMILY HISTORY of LUNG DISEASE: Positive for COPD in the diet and asthma in the past.  No history of pulmonary fibrosis.  No cystic fibrosis denies hypersensitive pneumonitis no autoimmune disease.     EXPOSURE HISTORY:  -She did smoke cigarettes between 1979 and 2012.  She quit smoking in 2012 when  her interstitial lung disease was diagnosed.  She smoked around 10 cigarettes a day.  No cigars.  No passive smoking.  No vaping.  No marijuana use.  No cocaine use.  No intravenous drug use.     HOME and HOBBY DETAILS : [place]  Is a single-family home is also 62 years of age.  In the house there is no dampness.  There is no mold or mildew in the shower curtain.  There is no mold or mildew anywhere in the house.  There is no humidifier in the house no CPAP use in the house.  No nebulizer machine.  No steam iron.  No Jacuzzi.  No misting Fountain in the house.  No pet birds or parakeets.  No pet gerbils.  No feather pillows or blankets.  No music habits.  No gardening habits.   She does throw prone to the deer but there is no mold in the:     OCCUPATIONAL HISTORY (122 questions) : There is significant water damage or mold exposure at work.  She works for [employer] since 2016.  Prior to this she was not working any atmosphere that had dampness of mold.  She states the building is a very old building.  She says that there has been significant water damage and water leak.  Water has port in into the several rooms in the office.  She says there is mold in the hallways.  There is mold in the break room.  There is mold in the bosses office.  She is not sure but she thinks there is mold even in her cubicle.  Is been going on for several years but increased in the last few years.  She says on her desk every morning there are black spots but she does not know what it is.  Her husband showed pictures of the office of the right leg significant dampness and rotting of the walls and floor.  She works as a Catering manager.  It is just her and her boss.  Rest of the questionnaire is negative.     PULMONARY TOXICITY HISTORY (27 items): Never been on urinary antibiotic Macrodantin.  Currently on prednisone.     RECENT LABS   Results for NAMIRAH, SANTAANA (MRN [id number]) as of 04/04/2020 12:42   Ref. Range 04/02/2020 18:57  Anti Nuclear Antibody (ANA) Latest Ref Range: Negative  Negative  ANCA Proteinase 3 Latest Ref Range: 0.0 - 3.5 U/mL <3.5  CCP Antibodies IgG/IgA Latest Ref Range: 0 - 19 units 4  Myeloperoxidase Abs Latest Ref Range: 0.0 - 9.0 U/mL <9.0  RA Latex Turbid. Latest Ref Range: 0.0 - 13.9 IU/mL <10.0      CT Chest High Resolution   Result Date: 04/03/2020 CLINICAL DATA:  Inpatient. Evaluate interstitial lung disease. Dyspnea. EXAM: CT CHEST WITHOUT CONTRAST TECHNIQUE: Multidetector CT imaging of the chest was performed following the standard protocol without intravenous contrast. High resolution imaging of the lungs, as well as inspiratory and expiratory imaging, was  performed. COMPARISON:  04/02/2020 chest CT angiogram.  10/08/2010 chest CT. FINDINGS: Cardiovascular: Normal heart size. No significant pericardial effusion/thickening. Two lead left subclavian pacemaker with lead tips in the right atrium and right ventricular apex. Mildly atherosclerotic nonaneurysmal thoracic aorta. Normal caliber pulmonary arteries. Mediastinum/Nodes: No discrete thyroid nodules. Unremarkable esophagus. No pathologically enlarged axillary, mediastinal or hilar lymph nodes, noting limited sensitivity for the detection of  hilar adenopathy on this noncontrast study. Lungs/Pleura: No pneumothorax. No pleural effusion. Mild centrilobular and paraseptal emphysema in the upper lobes. No acute consolidative airspace disease or lung masses. Right middle lobe solid 7 mm pulmonary nodule along the minor fissure (series 7/image 65), stable since 2012 CT, considered benign. No additional significant pulmonary nodules. Patchy confluent peripheral reticulation and peripheral lines throughout both lungs with associated mild traction bronchiectasis and architectural distortion, with regions of relative sparing of the immediate subpleural lungs. No clear apicobasilar gradient to these findings. No frank honeycombing (mild subpleural cystic changes in the upper lobes are favored represent paraseptal emphysema). These findings have progressed significantly since 10/08/2010 chest CT. Upper abdomen: No acute abnormality. Musculoskeletal: No aggressive appearing focal osseous lesions. Moderate thoracic spondylosis. IMPRESSION: 1. Spectrum of findings compatible with fibrotic interstitial lung disease without frank honeycombing or clear apicobasilar gradient. Findings have progressed since 2012 chest CT. The regions of immediate subpleural sparing are more suggestive of fibrotic phase nonspecific interstitial pneumonia (NSIP), although usual interstitial pneumonia (UIP) is on the differential particularly given  progression. Findings are indeterminate for UIP per consensus guidelines: Diagnosis of Idiopathic Pulmonary Fibrosis: An Official ATS/ERS/JRS/ALAT Clinical Practice Guideline. Am Rosezetta Schlatter Crit Care Med Vol 198, Iss 5, [id number], May 21 2017. 2. Aortic Atherosclerosis (ICD10-I70.0) and Emphysema (ICD10-J43.9). Electronically Signed   By: Delbert Phenix M.D.   On: 04/03/2020 17:23   COURS  Significant Hospital Events   7/13 Admit   7/15 -  she says in jan 2012 dx with ILD . Jan 2021 was CT. In may 2012 she quti smoking. Followed with Dr Antony Salmon. Last visit with him and CT was in 2015. But stable till June 2021 and desaturating with exercise at home. Now needing oxygen. She is nervous about procedures incluidng bronch. Feels better after steroids    04/04/2020 - > feels better. 87% on RA at rest now.  Husband reports mild confusion overnight.  He is wondering if the lorazepam, steroids and oxygen are contributing to this.  This morning she not as happy as and cheerful as before.  Otherwise no new complaints.  Overall she feels better since admission and attributes to steroids.  We went over the ILD questionnaire and is documented below.  Assessment & Plan:    #Baseline: Interstitial lung disease -in 2012.  Previous smoker and quit and stable   #Current: Significant mold exposure x4 years especially worse in the last few years.  Interstitial lung disease flareup with new oxygen dependency this admission.  High-resolution CT chest alternative pattern to UIP but no clear description of air trapping   -Discussion: She might have had RB ILD in the past or NSIP based on the CT scan appearance.  She is doing stable after quitting smoking but in the last few years she has had mold exposure and it has been a deterioration and presentation with symptoms suggestive of ILD flareup.  It appears that systolic heart failure is not under consideration especially with normal BNP.  Serologies are negative.  She does not  want any biopsy or invasive procedures.  Clinically this is not UIP/IPF.  There is no evidence of autoimmune interstitial lung disease.  Baseline features overall could be NSIP or hypersensitive pneumonitis [although no air-trapping] or possibly smokers interstitial lung disease.  However the current flareup seems temporally related to mold exposure.  Is the only inciting antigen that we can find currently.  And therefore this could be a flareup and progression of previous NSIP or she  has underlying hypersensitive pneumonitis.   Plan -Under no circumstances she should go back to her work atmosphere where there is mold exposure   -Needs prednisone for few to several weeks and may be 1 few months depending on the course [she seems to be improving although there is some mild confusion last night which could be multifactorial from benzodiazepines and steroids] -> okay to switch to p.o. prednisone April 05, 2020 at 50 mg/day x 2 weeks and then 40 mg/day x 2 weeks and then 30 mg/day x 2 weeks and then 20 mg/day x 2 weeks and then 10 mg/day to continue until further instructions   -Obtain hypersensitive pneumonitis blood panel while in the hospital and blood QuantiFERON gold   -Obtain spirometry and DLCO while in the hospital if possible   -Seems to still need oxygen at rest 2 L [today she is 87% on room air at rest although this is an improvement]   -Meet with Engineer, civil (consulting) for disability information which she is interested   - Prognosis: Unclear prognosis.  It is possible that with steroids over few to several weeks or a few months that she improves and goes back to baseline.  It is also possible that she has only partial improvement.  There is also a small chance she just deals with progressive ILD in which case antifibrotic's would be indicated as an outpatient.  Overall I am optimistic that she would have at least a partial improvement   -She would need to lose weight and get fit (given  age below 38 -in the long run she might be a candidate for lung transplant if the need arose] -we will address this in the office   -Hospitalist to deal with encephalopathy if present.  At this point in time I have discontinued the lorazepam but left on her home diazepam     - can go home 04/05/20= after social work and o2 asessment   She and her husband updated   Results for LOVELLE, HAUSKNECHT (MRN [id number]) as of 04/03/2020 13:38   Ref. Range 10/12/2010 20:44 05/12/2015 09:18 05/12/2015 09:33 04/02/2020 18:57  Anti Nuclear Antibody (ANA) Latest Ref Range: NEGATIVE  NEG NEG      Cyclic Citrullin Peptide Ab Latest Ref Range: 0.0 - 5.0 U/mL   <2.0      ds DNA Ab Latest Units: IU/mL   1      RA Latex Turbid. Latest Ref Range: 0.0 - 13.9 IU/mL <10 IU/mL <10   <10.0  Tissue Transglut Ab Latest Ref Range: <6 U/mL   1      ENA SM Ab Ser-aCnc Latest Ref Range: <1.0 NEG AI      <1.0 NEG    Results for THEODOSIA, RUSSIN (MRN [id number]) as of 04/03/2020 13:38   Ref. Range 08/27/2010 11:32 10/12/2010 09:33 05/12/2015 09:01 04/02/2020 18:57  Sed Rate Latest Ref Range: 0 - 22 mm/hr 16 16 10 8       OV 06/06/2020  Subjective:  Patient ID: Marcelyn Ditty, female , DOB: Jan 08, 1961 , age 77 y.o. , MRN: [id number] , ADDRESS: [address] [place] [address]   06/06/2020 -   Chief Complaint  Patient presents with   Follow-up    breathing is doing well with 2L. c/o non prod cough.    #Baseline interstitial lung disease in 2012 with progression in July 2021.  High-resolution CT chest pattern with alternative diagnosis suggestive of NSIP but significant mold exposure at workplace leading  up to July 2021 although no air-trapping.  Started empiric prednisone mid July 2021.  Reluctant to have surgical lung biopsy or transbronchial biopsy of bronchoscopy lavage  HPI [place] Schreur 62 y.o. -returns for follow-up.  Last seen in mid July 2021 in the hospital.  Since then she is seen nurse practitioner once towards  end of July 2021.  She reports overall improvement in symptoms but still has some dyspnea.  She says when she is on room air up and about in the house after 45 minutes to 1 hour her pulse ox nadir is around 88 to 92%.  She then uses oxygen to give her some relief.  She does not realize that oxygen is indicated if it is less than 88% only.  She says she is doing well with the prednisone overall although she has gained weight and has developed features of "moon face".  She continues to be hesitant about having any intervention to diagnose underlying ILD.  She is now off work and she is trying to pursue Social Security disability.  We did discuss the fact that if her condition improves she may not qualify for it.  She is not interested in [employer]. because she has good personal relationship with her previous Merchandiser, retail.  There is an invitation for her to work remotely and she is pondering that.  She does not have social short-term disability through her employer  In terms of vaccination:  She is going to have a flu shot in the month of October 2021 through primary care physician.  She has not had the Covid vaccine.  She is hesitant about the Covid vaccine because of fear of side effects and efficacy.  We discussed this extensively.  She is immunosuppressed because of prednisone.  She is also has interstitial lung disease and obesity and age over 69.   07/23/2020- APP visit Patient presents today for surgical clearance. She is planning cystoscopy/ureteroscopy and stent exchange on Monday November 8th with Dr. Vanna [facility]. She has no acute respiratory complaints. Her breathing has stayed the same. She goes most days at home without needing to use oxygen. She may need to use 2L 1-2 times a week for a couple of hours. She monitors her levels and she rarely see O2 below 88% RA.  She has not been vaccinated for covid-19, unsure if she will get it. Not currently smoking, quit in 2012. Her weight remains  stable since September. She has some lower back and abdominal pain which she takes hydrocodone at bedtime. She is following with ID and urology. Currenty on Levquin which she will complete 2 days after surgery. Denies acute symptoms of cough, chest tightness, wheezing. Afebrile.      OV 09/05/2020   Subjective:  Patient ID: Dietrich Pates Nyborg, female , DOB: December 02, 1960, age 53 y.o. years. , MRN: [id number],  ADDRESS: [address] [place] [address] PCP  [name], MD Providers : Treatment Team:  Attending Provider: Kalman Shan, MD Patient Care Team: [name], MD as PCP - General (Family Medicine) Regan Lemming, MD as PCP - Electrophysiology (Cardiology) O'Neal, Ronnald Ramp, MD as PCP - Cardiology (Cardiology)  Type of visit: Telephone/Video Circumstance: COVID-19 national emergency Identification of patient Doris Gravois Harrow with 1961/05/11 and MRN [id number] - 2 person identifier Risks: Risks, benefits, limitations of telephone visit explained. Patient understood and verbalized agreement to proceed Anyone else on call: none Patient location: her home This provider location: [address] in [place]  FU ILD.   #Baseline interstitial lung disease in 2012 with progression in July 2021.  High-resolution CT chest pattern with alternative diagnosis suggestive of NSIP but significant mold exposure at workplace leading up to July 2021 although no air-trapping.  Started empiric prednisone mid July 2021.  Reluctant to have surgical lung biopsy or transbronchial biopsy of bronchoscopy lavage   HPI Nuha Valdivieso Kapuscinski 62 y.o. -and this telephone visit she wanted to discuss about coming off prednisone.  Since I last saw her she ended up with bacterial sepsis.  She saw a nurse practitioner for urologic clearance.  She has a ureteral stent.  She is feeling fine now.  She does have some fatigue and myalgias post sepsis and post urologic procedure but otherwise is feeling good.   In terms of her shortness of breath.  She tells me that she is definitely worse than baseline compared to preadmission when I first met her.  She has dyspnea on exertion class II-3.  She uses oxygen for subjective relief but does not necessarily check her pulse ox if it goes below 88%.  She believes it goes below 88%.  She is on prednisone now 5 mg Monday Wednesday Friday.  She wants to come off this.  She is here to have Covid vaccine.  She says she is scared about getting Covid vaccine because of her immune system.  I reviewed her latest high-resolution CT chest.  Her groundglass opacities have resolved but she now has chronicity.  This means her CT scan is better than earlier this year when she was in flareup but worse than 2012 when she originally was found to have ILD.  And otherwise she has progressive phenotype of non-- IPF ILD   CT Chest data 09/01/20  Narrative & Impression  CLINICAL DATA:  Follow-up interstitial lung disease, on steroid therapy. Dyspnea with exertion with intermittent oxygen use.   EXAM: CT CHEST WITHOUT CONTRAST   TECHNIQUE: Multidetector CT imaging of the chest was performed following the standard protocol without intravenous contrast. High resolution imaging of the lungs, as well as inspiratory and expiratory imaging, was performed.   COMPARISON:  04/02/2020 chest CT angiogram.   FINDINGS: Cardiovascular: Normal heart size. No significant pericardial effusion/thickening. Two lead left subclavian pacemaker with lead tips in the right atrium and right ventricular apex. Atherosclerotic nonaneurysmal thoracic aorta. Normal caliber pulmonary arteries.   Mediastinum/Nodes: No discrete thyroid nodules. Unremarkable esophagus. No pathologically enlarged axillary, mediastinal or hilar lymph nodes, noting limited sensitivity for the detection of hilar adenopathy on this noncontrast study.   Lungs/Pleura: No pneumothorax. No pleural effusion. No  acute consolidative airspace disease or lung masses. Few scattered small solid right pulmonary nodules, largest 4 mm along the minor fissure (series 13/image 69), all stable and considered benign. No new significant pulmonary nodules. No significant lobular air trapping or evidence of tracheobronchomalacia on the expiration sequence. Mild centrilobular and paraseptal emphysema. Patchy mild-to-moderate peripheral ground-glass opacity and reticulation throughout both lungs with associated minimal architectural distortion. No significant regions traction bronchiectasis. No frank honeycombing. These findings (particularly the previously described peripheral reticulation and perilobular lines) appear mildly improved since 04/03/2020 chest CT. The ground-glass opacities are decreased and the reticulation and architectural distortion are increased since remote 2012 chest CT. Slight upper lung predominance to these findings.   Upper abdomen: Coarse posterior left liver calcification, unchanged.   Musculoskeletal: No aggressive appearing focal osseous lesions. Moderate thoracic spondylosis.   IMPRESSION: 1. Spectrum of findings compatible with fibrotic interstitial lung disease  without frank honeycombing, with a slight upper lung predominance. Findings have progressed since remote 2012 chest CT, and appear mildly improved in the interval since 04/03/2020 chest CT. Differential considerations include chronic hypersensitivity pneumonitis or fibrotic NSIP/postinflammatory fibrosis. Findings are suggestive of an alternative diagnosis (not UIP) per consensus guidelines: Diagnosis of Idiopathic Pulmonary Fibrosis: An Official ATS/ERS/JRS/ALAT Clinical Practice Guideline. Am Rosezetta Schlatter Crit Care Med Vol 198, Iss 5, [phone], May 21 2017. 2. Aortic Atherosclerosis (ICD10-I70.0) and Emphysema (ICD10-J43.9).     Electronically Signed   By: Delbert Phenix M.D.   On: 09/01/2020 11:04    OV  01/01/2021  Subjective:  Patient ID: Marcelyn Ditty, female , DOB: 1960-10-08 , age 50 y.o. , MRN: [id number] , ADDRESS: [address] [place] [address] PCP [name], MD Patient Care Team: [name], MD as PCP - General (Family Medicine) Regan Lemming, MD as PCP - Electrophysiology (Cardiology) O'Neal, Ronnald Ramp, MD as PCP - Cardiology (Cardiology)  This Provider for this visit: Treatment Team:  Attending Provider: Kalman Shan, MD    01/01/2021 -   Chief Complaint  Patient presents with   Follow-up    Doing better, still winded going up stairs    FU ILD.     L;ast hrct dec 2021  HPI Yuki Goertzen Kleiber 62 y.o. -returns for follow-up with her husband for her ILD.  She tells me from a ILD standpoint dyspnea standpoint she is stable.  Does not use oxygen.  She is off prednisone and she is happy about that.  However she says since her sepsis admission in October 2021 she has lot of fatigue.  She is mostly in the house.  She does not have the motivation to go out.  She also has leg diffuse arthralgia.  This is adding to her shortness of breath.  She is wondering the rationale for this.  Back in July her ESR was normal and her TSH was normal in January 2022.  Current pulmonary function test shows stability.  ILD symptom score for dyspnea shows improvement but fatigue still persists.  Walking desaturation test is stable.  All documented below she is off prednisone.     OV 12/15/22 at [facility] with DR Hardin Negus is a 62 year old former smoker (quit 2012, 15-pack-year history) who presents for follow-up of predominant interstitial lung disease and fatigue with shortness of breath.  This is a scheduled visit.  She was last seen here on 19 August 2022.  At that time we ordered PFTs due to her persistent complaint of dyspnea.  She did not get this done.  This will have to be rescheduled.  She is very challenging to manage as she will not follow  recommendations bided to her.  She has been erratic and using her oxygen at nighttime and she does have severe sleep apnea for which she will not wear or even attempt to wear CPAP and associated sleep-related hypoxia/hypoventilation.  It is recommended that she undergo a formal titration study however she continues to refuse this.  She does not meet criteria for inspire device and she declined ENT referral for evaluation. I have advised her that untreated sleep apnea will be exceedingly detrimental to her.   She did have COVID-07 September 2022.  Continues to have dyspnea on exertion and fatigue.  Notes occasional wheezing.  No cough.  She states she is compliant with oxygen at 2 L/min nocturnally.  She has had no orthopnea or paroxysmal nocturnal dyspnea.  No lower  extremity edema.  Calf tenderness.    OV 02/17/2023  Subjective:  Patient ID: Dietrich Pates Welter, female , DOB: 12/07/1960 , age 45 y.o. , MRN: [id number] , ADDRESS: [address] [place] [address] PCP [name], MD Patient Care Team: [name], MD as PCP - General (Family Medicine) Regan Lemming, MD as PCP - Electrophysiology (Cardiology) O'Neal, Ronnald Ramp, MD as PCP - Cardiology (Cardiology)  This Provider for this visit: Treatment Team:  Attending Provider: Kalman Shan, MD   #Baseline interstitial lung disease in 2012 with progression in July 2021.  High-resolution CT chest pattern with alternative diagnosis suggestive of NSIP but significant mold exposure at workplace leading up to July 2021 although no air-trapping.  Started empiric prednisone mid July 2021.  Reluctant to have surgical lung biopsy or transbronchial biopsy of bronchoscopy lavage. Off prednisone end Jan 2022  #History of complete heart block status post [facility] pacemaker June 2017.  Follows with Dr. Elberta Fortis.  Last EKG 2023 without any pacing  #Severe sleep apnea -has followed with Dr. Sarina Ser at [facility].   Does not use CPAP or BiPAP or inspire.  #Acute right-sided maxillary sinusitis treated with Augmentin 01/17/2023.  By Karleen Hampshire Copeland.->  Refer to ENT 01/27/2023.  02/17/2023 -   Chief Complaint  Patient presents with   Follow-up    Switching providers, ILD     HPI Emahni Meinhart Pennywell 62 y.o. -presents for follow-up.  Is been a few years since I saw her.  She has been following with Dr. Sarina Ser and [facility].  She now wants to switch back.  She has sleep apnea.  Is here with her husband who is an independent historian.  She tells me that she is beginning to feel worse.  Some 6 or 12 months ago she could walk a full flight of 16 steps without stopping but only gets winded at the top.  Currently for the last 3 to 4 months she has been stopping midway to catch her breath.  She feels overall she is declining.  [place]vee when we did access hypoxemia sit/stand test.  She did not desaturate.  Infectious tachycardic at baseline and this got slightly worse with exertion.  She feels is not able to get a full breath of her when she completes steps.  She did have a CT scan of the chest in spring 2023 as follow-up to her original acute ILD hypersensitive pneumonitis presentation.  This showed improvement.  She had pulmonary function test with [place].  21st 2024.  This also shows further continued improvement.  Therefore she is surprised.  She does have some atypical chest pains.  She got treated for maxillary sinusitis to the end of April 2024 by primary care.  Then on Jan 27, 2023 she was referred to ENT.  She says she was diagnosed with a maxillary sinus bone spur given prednisone which really helped her.  The current CT scan is done at least a week or 2 after she completed the prednisone.  She is known to have complete heart block and is on a pacer.  But today she is tachycardic at rest.  December 2023 twelve-lead EKG did not show evidence of pacing.   SYMPTOM SCALE - ILD 04/04/2020    06/06/2020  01/01/2021  02/17/2023   O2 use RA - 87% in hospital Uses o2 at home but below walk in on RA RA at home. Does not use 02 ra  Shortness of Breath 0 ->  5 scale with 5 being worst (score 6 If unable to do)     At rest 2 - 0 1  Simple tasks - showers, clothes change, eating, shaving 2 3 0 1  Household (dishes, doing bed, laundry) 5 3 3 2   Shopping 5 x x 3  Walking level at own pace 4 2 0 2  Walking up Stairs 5 3.5 5 4.5  Total (30-36) Dyspnea Score 23 11.5 8 13.5  How bad is your cough? x 1 1 0  How bad is your fatigue x 4 4 3   How bad is nausea x 0 0 1  How bad is vomiting?   x 0 0 0  How bad is diarrhea? x 2 0 0  How bad is anxiety? x 3 4 4.5  How bad is depression x 1 0 0      Simple office walk 185 feet x  3 laps goal with forehead probe 06/06/2020  01/01/2021  02/17/2023   O2 used ra ra ra  Number laps completed 3 3 Sit stand x 15  Comments about pace normal avg good  Resting Pulse Ox/HR 98% and 101/min 99% and 95/min 98% and ?HR 96  Final Pulse Ox/HR 95% and 118/min 97% and 100/min 97% and HRR 115  Desaturated </= 88% x x   Desaturated <= 3% points c=x x   Got Tachycardic >/= 90/min x yes   Symptoms at end of test x Some dyspnea on last lap Mild 2 of 10 dyspna  Miscellaneous comments x x       PFT  Results for SHERILYNN, FRERKING (MRN [id number]) as of 01/01/2021 09:20  Ref. Range 04/04/2020 12:46 09/01/2020 11:15 12/15/20 at [facility]  FVC-Pre Latest Units: L 2.25  2.53  FVC-%Pred-Pre Latest Units: % 69 78 78%   Results for QIRAT, RAUTH (MRN [id number]) as of 01/01/2021 09:20  Ref. Range 04/04/2020 12:46 09/01/2020 11:15 12/15/20 [facility]  DLCO unc Latest Units: ml/min/mmHg 14.04 15.24 14.5  DLCO unc % pred Latest Units: % 71 77 73%    PFT     Latest Ref Rng & Units 02/07/2023    3:07 PM 09/01/2020   11:15 AM 04/04/2020   12:46 PM  PFT Results  FVC-Pre L 2.76  P  2.25   FVC-Predicted Pre % 86  P 78  69   FVC-Post L 2.62  P    FVC-Predicted Post % 82  P    Pre  FEV1/FVC % % 82  P 82  85   Post FEV1/FCV % % 85  P    FEV1-Pre L 2.26  P 2.08  1.92   FEV1-Predicted Pre % 91  P 81  75   FEV1-Post L 2.22  P    DLCO uncorrected ml/min/mmHg 20.27  P 15.24  14.04   DLCO UNC% % 103  P 77  71   DLCO corrected ml/min/mmHg 20.27  P  13.23   DLCO COR %Predicted % 103  P  66   DLVA Predicted % 120  P 86  97   TLC L 4.49  P    TLC % Predicted % 91  P    RV % Predicted % 96  P      P Preliminary result     HRCT March 2023  Narrative & Impression  CLINICAL DATA:  Interstitial lung disease   EXAM: CT CHEST WITHOUT CONTRAST   TECHNIQUE: Multidetector CT imaging of the chest was performed following the standard  protocol without intravenous contrast. High resolution imaging of the lungs, as well as inspiratory and expiratory imaging, was performed.   RADIATION DOSE REDUCTION: This exam was performed according to the departmental dose-optimization program which includes automated exposure control, adjustment of the mA and/or kV according to patient size and/or use of iterative reconstruction technique.   COMPARISON:  Chest CT dated September 01, 2020   FINDINGS: Cardiovascular: Normal heart size. No pericardial effusion. Left chest wall cardiac conduction device with leads in the right atrium and right ventricle. Mild calcified plaque of the thoracic aorta.   Mediastinum/Nodes: Esophagus and thyroid are unremarkable. No pathologically enlarged lymph nodes seen in the chest.   Lungs/Pleura: Central airways are patent. No significant air trapping. Upper lung predominant centrilobular emphysema and cystic change with associated interstitial and mild ground-glass opacities with both a peribronchovascular and subpleural distribution. Associated ground-glass appears slightly decreased when compared with prior exam. No pleural effusion or pneumothorax.   Upper Abdomen: No acute abnormality.   Musculoskeletal: No chest wall mass or suspicious bone  lesions identified.   IMPRESSION: 1. Upper lobe predominant fibrotic interstitial lung disease, associated ground-glass appears slightly decreased when compared with most recent prior exam dated September 01, 2020. Findings are likely due to smoking related interstitial lung disease. Fibrotic HP an additional consideration, although this is less likely given no evidence of air trapping. Findings are suggestive of an alternative diagnosis (not UIP) per consensus guidelines: Diagnosis of Idiopathic Pulmonary Fibrosis: An Official ATS/ERS/JRS/ALAT Clinical Practice Guideline. Am Rosezetta Schlatter Crit Care Med Vol 198, Iss 5, [phone], May 21 2017. 2. Aortic Atherosclerosis (ICD10-I70.0) and Emphysema (ICD10-J43.9).     Electronically Signed   By: Allegra Lai M.D.   On: 11/20/2021 10:22      Latest Reference Range & Units 12/16/09 09:47 08/27/10 11:32 03/08/12 15:51 03/12/13 09:28 04/24/14 15:38 05/12/15 09:01 05/22/15 15:42 02/26/16 15:48 03/16/16 16:39 12/13/16 11:45 01/09/18 08:49 06/26/18 10:31 04/13/19 08:12 04/01/20 14:49 04/03/20 05:18 04/04/20 04:40 04/05/20 10:59 04/09/20 11:12 07/13/20 12:14 07/15/20 10:02 07/15/20 17:18 07/15/20 21:44 07/16/20 05:21 07/17/20 04:41 07/18/20 06:27 07/19/20 06:24 08/06/20 09:16 10/13/20 11:11 10/19/21 19:55 11/17/21 08:31 03/19/22 08:54 09/02/22 18:27 09/03/22 03:29 09/15/22 11:22 12/01/22 08:30  Creatinine 0.40 - 1.20 mg/dL 0.6 0.5 0.5 0.7 0.7 1.47 0.75 0.68 0.60 0.61 0.62 0.63 0.63 0.68 0.84 0.79 0.77 0.78 0.84 1.73 (H) 1.20 (H) 1.21 (H) 0.79 0.67 0.51 0.52 0.60 0.60 0.68 0.64 0.72 0.63 0.63 0.62 0.64  (H): Data is abnormally high   has a past medical history of Chronic systolic CHF (congestive heart failure) (HCC) (07/15/2020), Complete heart block (HCC) (02/26/2016), Family history of ovarian cancer, Generalized anxiety disorder (07/15/2020), GERD (gastroesophageal reflux disease), History of kidney stones, Hyperlipidemia, Hypertension, Interstitial  lung disease (HCC), Pollen allergies, Presence of permanent cardiac pacemaker, Pulmonary fibrosis (HCC), Squamous cell carcinoma (2014), and Subclinical hyperthyroidism (06/17/2014).   reports that she quit smoking about 12 years ago. Her smoking use included cigarettes. She has a 15.00 pack-year smoking history. She has never used smokeless tobacco.  Past Surgical History:  Procedure Laterality Date   CESAREAN SECTION     COSMETIC SURGERY     skin cancer face   CYSTOSCOPY W/ URETERAL STENT PLACEMENT Left 07/15/2020   Procedure: CYSTOSCOPY WITH RETROGRADE PYELOGRAM/URETERAL STENT PLACEMENT;  Surgeon: Riki Altes, MD;  Location: [facility] ORS;  Service: Urology;  Laterality: Left;   CYSTOSCOPY/URETEROSCOPY/HOLMIUM LASER/STENT PLACEMENT Left 07/28/2020   Procedure: CYSTOSCOPY/URETEROSCOPY/HOLMIUM LASER/STENT Exchange;  Surgeon: Vanna Scotland, MD;  Location: [facility] ORS;  Service: Urology;  Laterality: Left;   EP IMPLANTABLE DEVICE N/A 02/26/2016   Procedure: Pacemaker Implant;  Surgeon: Will Jorja Loa, MD;  Location: [facility];  Service: Cardiovascular;  Laterality: N/A;   TUBAL LIGATION      Allergies  Allergen Reactions   Codeine Hives and Itching   Sulfonamide Derivatives Hives   Paroxetine Other (See Comments)    unknown   Lisinopril Rash   Losartan Potassium Other (See Comments)    Leg felt heavy   Neomycin-Polymyxin-Dexameth Itching, Swelling and Other (See Comments)    Ey drop/Swelling and itching of eye    Oxycodone-Acetaminophen Other (See Comments)    Mild SOB, feels funny   Sulfa Antibiotics Hives and Rash         Immunization History  Administered Date(s) Administered   Influenza Nasal 06/20/2017   Influenza Whole 07/04/2008, 06/18/2009, 08/27/2010   Influenza, Seasonal, Injecte, Preservative Fre 09/03/2010, 06/22/2011, 06/27/2012   Influenza,inj,Quad PF,6+ Mos 07/21/2015, 07/06/2018, 08/10/2021   Influenza-Unspecified 06/27/2012, 07/17/2014, 07/19/2016,  07/21/2019, 07/17/2022   Td 05/10/2002   Tdap 10/28/2014   Zoster Recombinat (Shingrix) 12/08/2022, 02/08/2023    Family History  Problem Relation Age of Onset   Anxiety disorder Mother    Hypertension Mother    Ovarian cancer Mother 33   Cancer Maternal Aunt        breast   Heart failure Maternal Grandfather      Current Outpatient Medications:    acetaminophen (TYLENOL) 500 MG tablet, Take 1,000 mg by mouth every 6 (six) hours as needed. As needed, Disp: , Rfl:    albuterol (VENTOLIN HFA) 108 (90 Base) MCG/ACT inhaler, Inhale 2 puffs into the lungs every 6 (six) hours as needed for wheezing or shortness of breath., Disp: , Rfl:    carvedilol (COREG) 25 MG tablet, TAKE ONE TABLET BY MOUTH TWICE A DAY WITH MEALS, Disp: 180 tablet, Rfl: 3   diazepam (VALIUM) 5 MG tablet, TAKE ONE TABLET BY MOUTH EVERY 8 HOURS AS NEEDED FOR ANXIETY, Disp: 30 tablet, Rfl: 3   famotidine (PEPCID) 20 MG tablet, Take 1 tablet (20 mg total) by mouth daily., Disp: 30 tablet, Rfl: 0   fluticasone (FLONASE) 50 MCG/ACT nasal spray, Place 2 sprays into both nostrils daily., Disp: , Rfl:    spironolactone (ALDACTONE) 25 MG tablet, TAKE 1 TABLET BY MOUTH DAILY, Disp: 90 tablet, Rfl: 3 No current facility-administered medications for this visit.  Facility-Administered Medications Ordered in Other Visits:    albuterol (PROVENTIL) (2.5 MG/3ML) 0.083% nebulizer solution 2.5 mg, 2.5 mg, Nebulization, Once, Kalman Shan, MD      Objective:   Vitals:   02/17/23 1357  BP: 120/70  Pulse: 96  SpO2: 96%  Weight: 211 lb 12.8 oz (96.1 kg)  Height: 5' 2.75" (1.594 m)    Estimated body mass index is 37.82 kg/m as calculated from the following:   Height as of this encounter: 5' 2.75" (1.594 m).   Weight as of this encounter: 211 lb 12.8 oz (96.1 kg).  @WEIGHTCHANGE @  [employer]   02/17/23 1357  Weight: 211 lb 12.8 oz (96.1 kg)     Physical Exam   General: No distress. Mild chronic  deconditioined O2 at rest: no Cane present: no Sitting in wheel chair: no Frail: no Obese: YES Neuro: Alert and Oriented x 3. GCS 15. Speech normal Psych: Pleasant Resp:  Barrel Chest - no.  Wheeze - no, Crackles - no, No overt respiratory distress CVS: Normal heart sounds. Murmurs -  no Ext: Stigmata of Connective Tissue Disease - no HEENT: Normal upper airway. PEERL +. No post nasal drip        Assessment:       ICD-10-CM   1. SOB (shortness of breath)  R06.02 ECHOCARDIOGRAM COMPLETE    CANCELED: ECHOCARDIOGRAM COMPLETE    2. Interstitial lung disease (HCC)  J84.9 CT Chest High Resolution    ECHOCARDIOGRAM COMPLETE    CANCELED: ECHOCARDIOGRAM COMPLETE         Plan:     Patient Instructions     ICD-10-CM   1. SOB (shortness of breath)  R06.02     2. Interstitial lung disease [facility])  J84.9        - CT 2023 spring and PFt 2024 May show impriovemet since 2021. Therefore, somewhat puzzling shortness of breath is worse. No evidence of anemia spring 2024  Plan  - HRCT supine and prone - do ECHO  Followup  Video visit with Dr Marchelle Gearing 15 min to discuss result - if non-contributory will order CPST        SIGNATURE    Dr. Kalman Shan, M.D., F.C.C.P,  Pulmonary and Critical Care Medicine Staff Physician, [facility] Center Director - Interstitial Lung Disease  Program  Pulmonary Fibrosis [facility] at [facility] [place], [place], [address]  Pager: [phone], If no answer or between  15:00h - 7:00h: call [phone] Telephone: [phone]  2:40 PM 02/17/2023

## 2023-02-16 NOTE — Patient Instructions (Signed)
ILD (interstitial lung disease) (HCC)  - stable (see PFT chart below  Plan  - spiro/dlco in 6-9 months  Fatigue, unspecified type Arthralgia, unspecified joint Dyspnea  - Thyrpid tests Jan 2022 is normal - need to sort out if just post sepsis (oct 2021) fatigue or  A bone/joint issue now that you are off prednisone or weight/deconditioning/stiff heart muscle (seen in echo July 2021)  Plan  - check ESR, Quant Gold, ANA, RF, CCP - will call with rsults  - might just need to attend pulm rehab  Followup  6-9 months but after breathing test  xxxxxxxxxxxxxx

## 2023-02-17 ENCOUNTER — Encounter: Payer: Self-pay | Admitting: Internal Medicine

## 2023-02-17 ENCOUNTER — Ambulatory Visit (INDEPENDENT_AMBULATORY_CARE_PROVIDER_SITE_OTHER): Payer: BC Managed Care – PPO | Admitting: Internal Medicine

## 2023-02-17 VITALS — BP 120/70 | HR 96 | Ht 62.75 in | Wt 211.8 lb

## 2023-02-17 DIAGNOSIS — J849 Interstitial pulmonary disease, unspecified: Secondary | ICD-10-CM | POA: Diagnosis not present

## 2023-02-17 DIAGNOSIS — R0602 Shortness of breath: Secondary | ICD-10-CM | POA: Diagnosis not present

## 2023-02-21 ENCOUNTER — Telehealth: Payer: Self-pay | Admitting: Internal Medicine

## 2023-02-21 NOTE — Telephone Encounter (Signed)
I do not see where the patient had a Sleep Study in April.  ATC the patient. LVM for the patient to return my call.

## 2023-02-21 NOTE — Telephone Encounter (Signed)
I spoke with the patient. She is wanting the ONO results mailed to her. I have placed the results in the mail and the patient is aware.  Nothing further needed.

## 2023-02-24 DIAGNOSIS — R0981 Nasal congestion: Secondary | ICD-10-CM | POA: Diagnosis not present

## 2023-02-24 DIAGNOSIS — J329 Chronic sinusitis, unspecified: Secondary | ICD-10-CM | POA: Diagnosis not present

## 2023-02-24 DIAGNOSIS — J309 Allergic rhinitis, unspecified: Secondary | ICD-10-CM | POA: Diagnosis not present

## 2023-02-28 ENCOUNTER — Encounter: Payer: Self-pay | Admitting: Cardiology

## 2023-03-02 ENCOUNTER — Ambulatory Visit
Admission: RE | Admit: 2023-03-02 | Discharge: 2023-03-02 | Disposition: A | Discharge status: Home / Self Care | Payer: BC Managed Care – PPO | Source: Ambulatory Visit | Attending: Internal Medicine | Admitting: Internal Medicine

## 2023-03-02 DIAGNOSIS — J9809 Other diseases of bronchus, not elsewhere classified: Secondary | ICD-10-CM | POA: Diagnosis not present

## 2023-03-02 DIAGNOSIS — J849 Interstitial pulmonary disease, unspecified: Secondary | ICD-10-CM | POA: Insufficient documentation

## 2023-03-02 DIAGNOSIS — J432 Centrilobular emphysema: Secondary | ICD-10-CM | POA: Diagnosis not present

## 2023-03-02 NOTE — Telephone Encounter (Signed)
Aware I will follow up Friday about getting her and husband scheduled. Patient verbalized understanding and agreeable to plan.

## 2023-03-04 NOTE — Telephone Encounter (Signed)
Pt and husband scheduled for 7/17. She appreciates the help with this.

## 2023-03-09 ENCOUNTER — Other Ambulatory Visit: Payer: Self-pay | Admitting: Family Medicine

## 2023-03-09 NOTE — Telephone Encounter (Signed)
Last office visit 01/17/2023 for sinusitis.  Last refilled 10/22/22 for #30 with 3 refills.  Next appt:   No future appointments with PCP.

## 2023-03-10 ENCOUNTER — Ambulatory Visit (INDEPENDENT_AMBULATORY_CARE_PROVIDER_SITE_OTHER): Payer: BC Managed Care – PPO

## 2023-03-10 DIAGNOSIS — I442 Atrioventricular block, complete: Secondary | ICD-10-CM | POA: Diagnosis not present

## 2023-03-10 DIAGNOSIS — J9601 Acute respiratory failure with hypoxia: Secondary | ICD-10-CM | POA: Diagnosis not present

## 2023-03-10 DIAGNOSIS — I5189 Other ill-defined heart diseases: Secondary | ICD-10-CM | POA: Diagnosis not present

## 2023-03-10 LAB — CUP PACEART REMOTE DEVICE CHECK
Battery Remaining Longevity: 25 mo
Battery Remaining Percentage: 21 %
Battery Voltage: 2.87 V
Brady Statistic AP VP Percent: 1.2 %
Brady Statistic AP VS Percent: 1 %
Brady Statistic AS VP Percent: 99 %
Brady Statistic AS VS Percent: 1 %
Brady Statistic RA Percent Paced: 1.2 %
Brady Statistic RV Percent Paced: 99 %
Date Time Interrogation Session: 20240620021216
Implantable Lead Connection Status: 753985
Implantable Lead Connection Status: 753985
Implantable Lead Implant Date: 20170608
Implantable Lead Implant Date: 20170608
Implantable Lead Location: 753859
Implantable Lead Location: 753860
Implantable Pulse Generator Implant Date: 20170608
Lead Channel Impedance Value: 430 Ohm
Lead Channel Impedance Value: 450 Ohm
Lead Channel Pacing Threshold Amplitude: 0.75 V
Lead Channel Pacing Threshold Amplitude: 1.125 V
Lead Channel Pacing Threshold Pulse Width: 0.4 ms
Lead Channel Pacing Threshold Pulse Width: 0.4 ms
Lead Channel Sensing Intrinsic Amplitude: 4.5 mV
Lead Channel Setting Pacing Amplitude: 1.375
Lead Channel Setting Pacing Amplitude: 2 V
Lead Channel Setting Pacing Pulse Width: 0.4 ms
Lead Channel Setting Sensing Sensitivity: 2.5 mV
Pulse Gen Model: 2272
Pulse Gen Serial Number: 7903881

## 2023-03-11 ENCOUNTER — Other Ambulatory Visit (HOSPITAL_COMMUNITY): Payer: BC Managed Care – PPO

## 2023-03-14 ENCOUNTER — Telehealth: Payer: Self-pay | Admitting: Internal Medicine

## 2023-03-14 NOTE — Telephone Encounter (Signed)
PT has a appt for test results but one of her tests is sched out AFTER her upcoming appointment with Dr. Elvera Lennox. She wanted to resched but I only see an Aug 6th appt avail. She did not want to go that far out due to she has a pacemaker and results will affect her Cardiologist appts.Please call to advise if we can work her in. Her # is [phone]

## 2023-03-17 ENCOUNTER — Telehealth (INDEPENDENT_AMBULATORY_CARE_PROVIDER_SITE_OTHER): Payer: BC Managed Care – PPO | Admitting: Internal Medicine

## 2023-03-17 ENCOUNTER — Ambulatory Visit (HOSPITAL_COMMUNITY): Payer: BC Managed Care – PPO | Attending: Internal Medicine

## 2023-03-17 ENCOUNTER — Encounter: Payer: Self-pay | Admitting: Internal Medicine

## 2023-03-17 ENCOUNTER — Telehealth: Payer: Self-pay

## 2023-03-17 VITALS — Ht 62.5 in | Wt 210.0 lb

## 2023-03-17 DIAGNOSIS — J849 Interstitial pulmonary disease, unspecified: Secondary | ICD-10-CM

## 2023-03-17 DIAGNOSIS — I5022 Chronic systolic (congestive) heart failure: Secondary | ICD-10-CM

## 2023-03-17 DIAGNOSIS — J438 Other emphysema: Secondary | ICD-10-CM

## 2023-03-17 DIAGNOSIS — R0602 Shortness of breath: Secondary | ICD-10-CM

## 2023-03-17 DIAGNOSIS — I2510 Atherosclerotic heart disease of native coronary artery without angina pectoris: Secondary | ICD-10-CM

## 2023-03-17 LAB — ECHOCARDIOGRAM COMPLETE
Area-P 1/2: 4.65 cm2
Height: 62.5 in
S' Lateral: 2.4 cm
Weight: 3360 oz

## 2023-03-17 MED ORDER — SPIRIVA RESPIMAT 1.25 MCG/ACT IN AERS: 2.0000 | INHALATION_SPRAY | Freq: Every day | RESPIRATORY_TRACT | 0 refills | Status: DC

## 2023-03-17 NOTE — Patient Instructions (Addendum)
ICD-10-CM   1. SOB (shortness of breath)  R06.02     2. Interstitial lung disease (HCC)  J84.9     3. Paraseptal emphysema (HCC)  J43.8     4. Coronary artery calcification seen on CAT scan  I25.10     5. Chronic systolic CHF (congestive heart failure) (HCC)  I50.22       -High-resolution CT scan of the chest shows continued stability with your pulmonary fibrosis.  My personal visualization it is mild.  There is some emphysema from previous smoking.  On the CT scan of the chest and this could be contributing to some amount of shortness of breath and cough [currently untreated].  Would be curious to see what your heart ejection fraction is to see if that is another contributor to your shortness of breath worsening  -There is also coronary artery calcification on CT scan -and sometimes is associated with heart blood vessel blockage  Plan  - -Start empiric sample Spiriva for 1 month [you can pick this up at the [facility] clinic] -2 puffs once daily  -If this is helping you call us in 1 month and we will send in prescription -Await echocardiogram results; if you do not hear from [place] by March 23, 2023 with the results please call us -Do meet with cardiology about your coronary artery calcification you have never had a stress test before -Do spirometry and DLCO in 4 months  Followup  -Return to see Dr. Marchelle Gearing for a 15-minute visit in 4 months after pulmonary function testing

## 2023-03-17 NOTE — Telephone Encounter (Signed)
Patient had a video visit today with MR. Will close encounter.

## 2023-03-17 NOTE — Progress Notes (Signed)
Anne Rush      History     62 y/o F, former smoker, who presented to [facility] on 7/13 with worsening shortness of breath.    The patient reports her increase in shortness of breath began around the first of July.  She had noted her oxygen levels to be in the high 80's at home.  She reports progressively increased shortness of breath, dyspnea with exertion and difficulty walking a flight of stairs.  She has also noted a tightness in the middle of her chest.  She has not had a COVID vaccine.     The patient is a Conservator, museum/gallery.  She is a former smoker quit 2012 after diagnosis of ILD.  She was previously followed by Pulmonary at [facility] (Dr. Ramond Dial, last seen in 2016) for ground glass opacities but has not seen them in several years due to financial constraints. She was supposed to have a lung biopsy. Her symptoms at the time were thought to be RBILD.  At that time she quit smoking and her FEV1 increased from 85 to >90 and infiltrates improved on CT.    ER evaluation included a CTA chest which was negative for PE but showed worsening ILD from her prior CT scan.  Troponin flat.     PCCM consulted for pulmonary evaluation.   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  Since it started 1 month ago is getting worse.  There is episodic dyspnea.  Severity is listed below.  She also has a cough that she reports specifically started on Monday, July 12, Rush.  Since it started and with steroids it is better.  It is mild in intensity.  She does bring up some phlegm that is green.  A separate brown speckles.  There is no hemoptysis.  Cough does not get worse when she lies down.  Does not affect her voice.  Does not clear her throat.  No tickle.  All the symptoms started  approximately 5 weeks before admission.        Past Medical History : Positive for heart failure.  Third-degree heart block she has a pacer.  This has been going on for several years.  Currently ejection fraction reported as slightly low normal but according to Dr. Rito Ehrlich and the hospitalist cardiology reviewed the echo again and feel the ejection fraction is normal.  Current BNP is normal.  She has obesity.  Otherwise negative past medical history.  No COPD or asthma.  No collagen vascular disease no scleroderma no lupus no polymyositis.  Autoimmune panel is all negative.  No stroke or thyroid disease.  No hepatitis.  No history of blood clot.     ROS: Positive for dyspnea.  Was stable dyspnea all the way from 2012 up until recently 5 weeks ago.  There is associated with fatigue and arthralgia.  No dysphagia.  No acid reflux.  No vomiting no nausea.     FAMILY HISTORY of LUNG DISEASE: Positive for COPD in the diet and asthma in the past.  No history of pulmonary fibrosis.  No cystic fibrosis denies hypersensitive pneumonitis no autoimmune disease.     EXPOSURE HISTORY:  -She did smoke cigarettes between 1979 and 2012.  She quit smoking in 2012 when her  interstitial lung disease was diagnosed.  She smoked around 10 cigarettes a day.  No cigars.  No passive smoking.  No vaping.  No marijuana use.  No cocaine use.  No intravenous drug use.     HOME and HOBBY DETAILS : [place]  Is a single-family home is also 62 years of age.  In the house there is no dampness.  There is no mold or mildew in the shower curtain.  There is no mold or mildew anywhere in the house.  There is no humidifier in the house no CPAP use in the house.  No nebulizer machine.  No steam iron.  No Jacuzzi.  No misting [place] in the house.  No pet birds or parakeets.  No pet gerbils.  No feather pillows or blankets.  No music habits.  No gardening habits.   She does throw prone to the deer but there is no mold in the:     OCCUPATIONAL HISTORY (122 questions) : There is significant water damage or mold exposure at work.  She works for [employer] since 2016.  Prior to this she was not working any atmosphere that had dampness of mold.  She states the building is a very old building.  She says that there has been significant water damage and water leak.  Water has Rush in into the several rooms in the office.  She says there is mold in the hallways.  There is mold in the break room.  There is mold in the bosses office.  She is not sure but she thinks there is mold even in her cubicle.  Is been going on for several years but increased in the last few years.  She says on her desk every morning there are black spots but she does not know what it is.  Her husband showed pictures of the office of the right leg significant dampness and rotting of the walls and floor.  She works as a Catering manager.  It is just her and her boss.  Rest of the questionnaire is negative.     PULMONARY TOXICITY HISTORY (27 items): Never been on urinary antibiotic Macrodantin.  Currently on prednisone.     RECENT LABS   Results for Anne Rush (MRN [id number]) as of 7/16/Rush 12:42   Ref. Range 7/14/Rush 18:57  Anti Nuclear Antibody (ANA) Latest Ref Range: Negative  Negative  ANCA Proteinase 3 Latest Ref Range: 0.0 - 3.5 U/mL <3.5  CCP Antibodies IgG/IgA Latest Ref Range: 0 - 19 units 4  Myeloperoxidase Abs Latest Ref Range: 0.0 - 9.0 U/mL <9.0  RA Latex Turbid. Latest Ref Range: 0.0 - 13.9 IU/mL <10.0      CT Chest High Resolution   Result Date: 7/15/Rush CLINICAL DATA:  Inpatient. Evaluate interstitial lung disease. Dyspnea. EXAM: CT CHEST WITHOUT CONTRAST TECHNIQUE: Multidetector CT imaging of the chest was performed following the standard protocol without intravenous contrast. High resolution imaging of the lungs, as well as inspiratory and expiratory imaging, was  performed. COMPARISON:  07/14/Rush chest CT angiogram.  10/08/2010 chest CT. FINDINGS: Cardiovascular: Normal heart size. No significant pericardial effusion/thickening. Two lead left subclavian pacemaker with lead tips in the right atrium and right ventricular apex. Mildly atherosclerotic nonaneurysmal thoracic aorta. Normal caliber pulmonary arteries. Mediastinum/Nodes: No discrete thyroid nodules. Unremarkable esophagus. No pathologically enlarged axillary, mediastinal or hilar lymph nodes, noting limited sensitivity for the detection of hilar  adenopathy on this noncontrast study. Lungs/Pleura: No pneumothorax. No pleural effusion. Mild centrilobular and paraseptal emphysema in the upper lobes. No acute consolidative airspace disease or lung masses. Right middle lobe solid 7 mm pulmonary nodule along the minor fissure (series 7/image 65), stable since 2012 CT, considered benign. No additional significant pulmonary nodules. Patchy confluent peripheral reticulation and peripheral lines throughout both lungs with associated mild traction bronchiectasis and architectural distortion, with regions of relative sparing of the immediate subpleural lungs. No clear apicobasilar gradient to these findings. No frank honeycombing (mild subpleural cystic changes in the upper lobes are favored represent paraseptal emphysema). These findings have progressed significantly since 10/08/2010 chest CT. Upper abdomen: No acute abnormality. Musculoskeletal: No aggressive appearing focal osseous lesions. Moderate thoracic spondylosis. IMPRESSION: 1. Spectrum of findings compatible with fibrotic interstitial lung disease without frank honeycombing or clear apicobasilar gradient. Findings have progressed since 2012 chest CT. The regions of immediate subpleural sparing are more suggestive of fibrotic phase nonspecific interstitial pneumonia (NSIP), although usual interstitial pneumonia (UIP) is on the differential particularly given  progression. Findings are indeterminate for UIP per consensus guidelines: Diagnosis of Idiopathic Pulmonary Fibrosis: An Official ATS/ERS/JRS/ALAT Clinical Practice Guideline. Am Rosezetta Schlatter Crit Care Med Vol 198, Iss 5, [phone], May 21 2017. 2. Aortic Atherosclerosis (ICD10-I70.0) and Emphysema (ICD10-J43.9). Electronically Signed   By: Delbert Phenix M.D.   On: 07/15/Rush 17:23   COURS  Significant Hospital Events   7/13 Admit   7/15 -  she says in jan 2012 dx with ILD . Jan Rush was CT. In may 2012 she quti smoking. Followed with Dr Antony Salmon. Last visit with him and CT was in 2015. But stable till June Rush and desaturating with exercise at home. Now needing oxygen. She is nervous about procedures incluidng bronch. Feels better after steroids    7/16/Rush - > feels better. 87% on RA at rest now.  Husband reports mild confusion overnight.  He is wondering if the lorazepam, steroids and oxygen are contributing to this.  This morning she not as happy as and cheerful as before.  Otherwise no new complaints.  Overall she feels better since admission and attributes to steroids.  We went over the ILD questionnaire and is documented below.  Assessment & Plan:    #Baseline: Interstitial lung disease -in 2012.  Previous smoker and quit and stable   #Current: Significant mold exposure x4 years especially worse in the last few years.  Interstitial lung disease flareup with new oxygen dependency this admission.  High-resolution CT chest alternative pattern to UIP but no clear description of air trapping   -Discussion: She might have had RB ILD in the past or NSIP based on the CT scan appearance.  She is doing stable after quitting smoking but in the last few years she has had mold exposure and it has been a deterioration and presentation with symptoms suggestive of ILD flareup.  It appears that systolic heart failure is not under consideration especially with normal BNP.  Serologies are negative.  She does not  want any biopsy or invasive procedures.  Clinically this is not UIP/IPF.  There is no evidence of autoimmune interstitial lung disease.  Baseline features overall could be NSIP or hypersensitive pneumonitis [although no air-trapping] or possibly smokers interstitial lung disease.  However the current flareup seems temporally related to mold exposure.  Is the only inciting antigen that we can find currently.  And therefore this could be a flareup and progression of previous NSIP or she has  underlying hypersensitive pneumonitis.   Plan -Under no circumstances she should go back to her work atmosphere where there is mold exposure   -Needs prednisone for few to several weeks and may be 1 few months depending on the course [she seems to be improving although there is some mild confusion last night which could be multifactorial from benzodiazepines and steroids] -> okay to switch to p.o. prednisone July 17, Rush at 50 mg/day x 2 weeks and then 40 mg/day x 2 weeks and then 30 mg/day x 2 weeks and then 20 mg/day x 2 weeks and then 10 mg/day to continue until further instructions   -Obtain hypersensitive pneumonitis blood panel while in the hospital and blood QuantiFERON gold   -Obtain spirometry and DLCO while in the hospital if possible   -Seems to still need oxygen at rest 2 L [today she is 87% on room air at rest although this is an improvement]   -Meet with Engineer, civil (consulting) for disability information which she is interested   - Prognosis: Unclear prognosis.  It is possible that with steroids over few to several weeks or a few months that she improves and goes back to baseline.  It is also possible that she has only partial improvement.  There is also a small chance she just deals with progressive ILD in which case antifibrotic's would be indicated as an outpatient.  Overall I am optimistic that she would have at least a partial improvement   -She would need to lose weight and get fit (given  age below 91 -in the long run she might be a candidate for lung transplant if the need arose] -we will address this in the office   -Hospitalist to deal with encephalopathy if present.  At this point in time I have discontinued the lorazepam but left on her home diazepam     - can go home 04/05/20= after social work and o2 asessment   She and her husband updated   Results for HANSINI, CLODFELTER (MRN [id number]) as of 7/15/Rush 13:38   Ref. Range 10/12/2010 20:44 05/12/2015 09:18 05/12/2015 09:33 7/14/Rush 18:57  Anti Nuclear Antibody (ANA) Latest Ref Range: NEGATIVE  NEG NEG      Cyclic Citrullin Peptide Ab Latest Ref Range: 0.0 - 5.0 U/mL   <2.0      ds DNA Ab Latest Units: IU/mL   1      RA Latex Turbid. Latest Ref Range: 0.0 - 13.9 IU/mL <10 IU/mL <10   <10.0  Tissue Transglut Ab Latest Ref Range: <6 U/mL   1      ENA SM Ab Ser-aCnc Latest Ref Range: <1.0 NEG AI      <1.0 NEG    Results for Anne, Rush (MRN [id number]) as of 7/15/Rush 13:38   Ref. Range 08/27/2010 11:32 10/12/2010 09:33 05/12/2015 09:01 7/14/Rush 18:57  Sed Rate Latest Ref Range: 0 - 22 mm/hr 16 16 10 8       OV 9/17/Rush  Subjective:  Patient ID: Anne Rush, female , DOB: 01-Mar-1961 , age 11 y.o. , MRN: [id number] , ADDRESS: [address] [place] [address]   9/17/Rush -   Chief Complaint  Patient presents with   Follow-up    breathing is doing well with 2L. c/o non prod cough.    #Baseline interstitial lung disease in 2012 with progression in July Rush.  High-resolution CT chest pattern with alternative diagnosis suggestive of NSIP but significant mold exposure at workplace leading up  to July Rush although no air-trapping.  Started empiric prednisone mid July Rush.  Reluctant to have surgical lung biopsy or transbronchial biopsy of bronchoscopy lavage  HPI Anne Rush 62 y.o. -returns for follow-up.  Last seen in mid July Rush in the hospital.  Since then she is seen nurse practitioner once towards  end of July Rush.  She reports overall improvement in symptoms but still has some dyspnea.  She says when she is on room air up and about in the house after 45 minutes to 1 hour her pulse ox nadir is around 88 to 92%.  She then uses oxygen to give her some relief.  She does not realize that oxygen is indicated if it is less than 88% only.  She says she is doing well with the prednisone overall although she has gained weight and has developed features of "moon face".  She continues to be hesitant about having any intervention to diagnose underlying ILD.  She is now off work and she is trying to pursue Social Security disability.  We did discuss the fact that if her condition improves she may not qualify for it.  She is not interested in [employer]. because she has good personal relationship with her previous Merchandiser, retail.  There is an invitation for her to work remotely and she is pondering that.  She does not have social short-term disability through her employer  In terms of vaccination:  She is going to have a flu shot in the month of October Rush through primary care physician.  She has not had the Covid vaccine.  She is hesitant about the Covid vaccine because of fear of side effects and efficacy.  We discussed this extensively.  She is immunosuppressed because of prednisone.  She is also has interstitial lung disease and obesity and age over 20.   11/3/Rush- APP visit Patient presents today for surgical clearance. She is planning cystoscopy/ureteroscopy and stent exchange on Monday November 8th with Dr. Vanna [facility]. She has no acute respiratory complaints. Her breathing has stayed the same. She goes most days at home without needing to use oxygen. She may need to use 2L 1-2 times a week for a couple of hours. She monitors her levels and she rarely see O2 below 88% RA.  She has not been vaccinated for covid-19, unsure if she will get it. Not currently smoking, quit in 2012. Her weight remains  stable since September. She has some lower back and abdominal pain which she takes hydrocodone at bedtime. She is following with ID and urology. Currenty on Levquin which she will complete 2 days after surgery. Denies acute symptoms of cough, chest tightness, wheezing. Afebrile.      OV 12/17/Rush   Subjective:  Patient ID: Anne Rush, female , DOB: [birth year], age 12 y.o. years. , MRN: [id number],  ADDRESS: [address] [place] [address] PCP  [name], MD Providers : Treatment Team:  Attending Provider: Kalman Shan, MD Patient Care Team: [name], MD as PCP - General (Family Medicine) Regan Lemming, MD as PCP - Electrophysiology (Cardiology) O'Neal, Ronnald Ramp, MD as PCP - Cardiology (Cardiology)  Type of visit: Telephone/Video Circumstance: COVID-19 national emergency Identification of patient Anne Rush with [birth year] and MRN [id number] - 2 person identifier Risks: Risks, benefits, limitations of telephone visit explained. Patient understood and verbalized agreement to proceed Anyone else on call: none Patient location: her home This provider location: [address] in [place]  FU ILD.   #Baseline interstitial lung disease in 2012 with progression in July Rush.  High-resolution CT chest pattern with alternative diagnosis suggestive of NSIP but significant mold exposure at workplace leading up to July Rush although no air-trapping.  Started empiric prednisone mid July Rush.  Reluctant to have surgical lung biopsy or transbronchial biopsy of bronchoscopy lavage   HPI Anne Rush 62 y.o. -and this telephone visit she wanted to discuss about coming off prednisone.  Since I last saw her she ended up with bacterial sepsis.  She saw a nurse practitioner for urologic clearance.  She has a ureteral stent.  She is feeling fine now.  She does have some fatigue and myalgias post sepsis and post urologic procedure but otherwise is feeling good.   In terms of her shortness of breath.  She tells me that she is definitely worse than baseline compared to preadmission when I first met her.  She has dyspnea on exertion class II-3.  She uses oxygen for subjective relief but does not necessarily check her pulse ox if it goes below 88%.  She believes it goes below 88%.  She is on prednisone now 5 mg Monday Wednesday Friday.  She wants to come off this.  She is here to have Covid vaccine.  She says she is scared about getting Covid vaccine because of her immune system.  I reviewed her latest high-resolution CT chest.  Her groundglass opacities have resolved but she now has chronicity.  This means her CT scan is better than earlier this year when she was in flareup but worse than 2012 when she originally was found to have ILD.  And otherwise she has progressive phenotype of non-- IPF ILD   CT Chest data 09/01/20  Narrative & Impression  CLINICAL DATA:  Follow-up interstitial lung disease, on steroid therapy. Dyspnea with exertion with intermittent oxygen use.   EXAM: CT CHEST WITHOUT CONTRAST   TECHNIQUE: Multidetector CT imaging of the chest was performed following the standard protocol without intravenous contrast. High resolution imaging of the lungs, as well as inspiratory and expiratory imaging, was performed.   COMPARISON:  07/14/Rush chest CT angiogram.   FINDINGS: Cardiovascular: Normal heart size. No significant pericardial effusion/thickening. Two lead left subclavian pacemaker with lead tips in the right atrium and right ventricular apex. Atherosclerotic nonaneurysmal thoracic aorta. Normal caliber pulmonary arteries.   Mediastinum/Nodes: No discrete thyroid nodules. Unremarkable esophagus. No pathologically enlarged axillary, mediastinal or hilar lymph nodes, noting limited sensitivity for the detection of hilar adenopathy on this noncontrast study.   Lungs/Pleura: No pneumothorax. No pleural effusion. No  acute consolidative airspace disease or lung masses. Few scattered small solid right pulmonary nodules, largest 4 mm along the minor fissure (series 13/image 69), all stable and considered benign. No new significant pulmonary nodules. No significant lobular air trapping or evidence of tracheobronchomalacia on the expiration sequence. Mild centrilobular and paraseptal emphysema. Patchy mild-to-moderate peripheral ground-glass opacity and reticulation throughout both lungs with associated minimal architectural distortion. No significant regions traction bronchiectasis. No frank honeycombing. These findings (particularly the previously described peripheral reticulation and perilobular lines) appear mildly improved since 07/15/Rush chest CT. The ground-glass opacities are decreased and the reticulation and architectural distortion are increased since remote 2012 chest CT. Slight upper lung predominance to these findings.   Upper abdomen: Coarse posterior left liver calcification, unchanged.   Musculoskeletal: No aggressive appearing focal osseous lesions. Moderate thoracic spondylosis.   IMPRESSION: 1. Spectrum of findings compatible with fibrotic interstitial lung disease  without frank honeycombing, with a slight upper lung predominance. Findings have progressed since remote 2012 chest CT, and appear mildly improved in the interval since 07/15/Rush chest CT. Differential considerations include chronic hypersensitivity pneumonitis or fibrotic NSIP/postinflammatory fibrosis. Findings are suggestive of an alternative diagnosis (not UIP) per consensus guidelines: Diagnosis of Idiopathic Pulmonary Fibrosis: An Official ATS/ERS/JRS/ALAT Clinical Practice Guideline. Am Rosezetta Schlatter Crit Care Med Vol 198, Iss 5, [phone], May 21 2017. 2. Aortic Atherosclerosis (ICD10-I70.0) and Emphysema (ICD10-J43.9).     Electronically Signed   By: Delbert Phenix M.D.   On: 12/13/Rush 11:04    OV  01/01/2021  Subjective:  Patient ID: Anne Rush, female , DOB: 1960-10-08 , age 62 y.o. , MRN: [id number] , ADDRESS: [address] [place] [address] PCP [name], MD Patient Care Team: [name], MD as PCP - General (Family Medicine) Regan Lemming, MD as PCP - Electrophysiology (Cardiology) O'Neal, Ronnald Ramp, MD as PCP - Cardiology (Cardiology)  This Provider for this visit: Treatment Team:  Attending Provider: Kalman Shan, MD    01/01/2021 -   Chief Complaint  Patient presents with   Follow-up    Doing better, still winded going up stairs    FU ILD.     L;ast hrct dec Rush  HPI Yuki Goertzen Kleiber 62 y.o. -returns for follow-up with her husband for her ILD.  She tells me from a ILD standpoint dyspnea standpoint she is stable.  Does not use oxygen.  She is off prednisone and she is happy about that.  However she says since her sepsis admission in October Rush she has lot of fatigue.  She is mostly in the house.  She does not have the motivation to go out.  She also has leg diffuse arthralgia.  This is adding to her shortness of breath.  She is wondering the rationale for this.  Back in July her ESR was normal and her TSH was normal in January 2022.  Current pulmonary function test shows stability.  ILD symptom score for dyspnea shows improvement but fatigue still persists.  Walking desaturation test is stable.  All documented below she is off prednisone.     OV 12/15/22 at [facility] with DR Hardin Negus is a 62 year old former smoker (quit 2012, 15-pack-year history) who presents for follow-up of predominant interstitial lung disease and fatigue with shortness of breath.  This is a scheduled visit.  She was last seen here on 19 August 2022.  At that time we ordered PFTs due to her persistent complaint of dyspnea.  She did not get this done.  This will have to be rescheduled.  She is very challenging to manage as she will not follow  recommendations bided to her.  She has been erratic and using her oxygen at nighttime and she does have severe sleep apnea for which she will not wear or even attempt to wear CPAP and associated sleep-related hypoxia/hypoventilation.  It is recommended that she undergo a formal titration study however she continues to refuse this.  She does not meet criteria for inspire device and she declined ENT referral for evaluation. I have advised her that untreated sleep apnea will be exceedingly detrimental to her.   She did have COVID-07 September 2022.  Continues to have dyspnea on exertion and fatigue.  Notes occasional wheezing.  No cough.  She states she is compliant with oxygen at 2 L/min nocturnally.  She has had no orthopnea or paroxysmal nocturnal dyspnea.  No lower  extremity edema.  Calf tenderness.    OV 02/17/2023  Subjective:  Patient ID: Anne Rush, female , DOB: August 04, 1961 , age 51 y.o. , MRN: [id number] , ADDRESS: [address] [place] [address] PCP [name], MD Patient Care Team: [name], MD as PCP - General (Family Medicine) Regan Lemming, MD as PCP - Electrophysiology (Cardiology) O'Neal, Ronnald Ramp, MD as PCP - Cardiology (Cardiology)  This Provider for this visit: Treatment Team:  Attending Provider: Kalman Shan, MD .  02/17/2023 -   Chief Complaint  Patient presents with   Follow-up    Switching providers, ILD     HPI Anne Rush 62 y.o. -presents for follow-up.  Is been a few years since I saw her.  She has been following with Dr. Sarina Ser and [facility].  She now wants to switch back.  She has sleep apnea.  Is here with her husband who is an independent historian.  She tells me that she is beginning to feel worse.  Some 6 or 12 months ago she could walk a full flight of 16 steps without stopping but only gets winded at the top.  Currently for the last 3 to 4 months she has been stopping midway to  catch her breath.  She feels overall she is declining.  [place]evee when we did access hypoxemia sit/stand test.  She did not desaturate.  Infectious tachycardic at baseline and this got slightly worse with exertion.  She feels is not able to get a full breath of her when she completes steps.  She did have a CT scan of the chest in spring 2023 as follow-up to her original acute ILD hypersensitive pneumonitis presentation.  This showed improvement.  She had pulmonary function test with [place].  21st 2024.  This also shows further continued improvement.  Therefore she is surprised.  She does have some atypical chest pains.  She got treated for maxillary sinusitis to the end of April 2024 by primary care.  Then on Jan 27, 2023 she was referred to ENT.  She says she was diagnosed with a maxillary sinus bone spur given prednisone which really helped her.  The current CT scan is done at least a week or 2 after she completed the prednisone.  She is known to have complete heart block and is on a pacer.  But today she is tachycardic at rest.  December 2023 twelve-lead EKG did not show evidence of pacing.   OV 03/17/2023  Subjective:  Patient ID: Anne Pates Bretado, female , DOB: March 23, 1961 , age 34 y.o. , MRN: [id number] , ADDRESS: [address] [place] [address] PCP [name], MD Patient Care Team: [name], MD as PCP - General (Family Medicine) Regan Lemming, MD as PCP - Electrophysiology (Cardiology) O'Neal, Ronnald Ramp, MD as PCP - Cardiology (Cardiology)  This Provider for this visit: Treatment Team:  Attending Provider: Kalman Shan, MD    #Baseline interstitial lung disease in 2012 with progression in July Rush.  High-resolution CT chest pattern with alternative diagnosis suggestive of NSIP but significant mold exposure at workplace leading up to July Rush although no air-trapping.  Started empiric prednisone mid July Rush.  Reluctant to have surgical lung biopsy or  transbronchial biopsy of bronchoscopy lavage. Off prednisone end Jan 2022   #Severe sleep apnea -has followed with Dr. Sarina Ser at [facility].  Does not use CPAP or BiPAP or inspire.  #History of complete heart block status  post [facility] pacemaker June 2017.  Follows with Dr. Elberta Fortis.  Last EKG 2023 without any pacing  #chronic systolic dysfncton  ef 50-55% Aug 2022    #Acute right-sided maxillary sinusitis treated with Augmentin 01/17/2023.  By Karleen Hampshire Copeland.->  Refer to ENT 01/27/2023   Type of visit: Video Virtual Visit Identification of patient Anne Rush with Feb 15, 1961 and MRN [id number] - 2 person identifier Risks: Risks, benefits, limitations of telephone visit explained. Patient understood and verbalized agreement to proceed Anyone else on call: just patient Patient location: her home This provider location: [address]; [place]; [place] [address]. [facility]. [phone]    03/17/2023 -   Chief Complaint  Patient presents with   Follow-up    F/up CT Scan results, PFT, and echo will be done today at 3 PM.     HPI Anne Rush 62 y.o. -on this video visit I connected with the patient by the video signal as bad.  So it became a hybrid between video and telephone.  She tells me that she continues to be more short of breath than baseline.  She has also developed a cough but there is no fever or sputum production cough is dry.  We went over the results.  Echocardiogram is not done yet.  She is known to have systolic heart failure.  Echocardiogram is later today.  But she did have pulmonary function test and it is seen below.  It is improved.  She did have a high-resolution CT chest that I personally visualized and agreed with the radiologist interpretation that there is associated emphysema and ILD.  The ILD burden has not changed in a year.  I explained to the patient that ILD is not what is driving the worsening of shortness of breath.   She is not anemic.  She does have coronary artery calcification on the CT and has had I be curious to know what her current ejection fraction is Francis Dowse has complete heart block and she has chronic systolic heart failure since 2017 or so].  However for the emphysema we agreed to empirically treat with Spiriva and see if the cough and the shortness of breath improved with that.  She is agreeable to this plan.     SYMPTOM SCALE - ILD 7/16/Rush   9/17/Rush  01/01/2021  02/17/2023   O2 use RA - 87% in hospital Uses o2 at home but below walk in on RA RA at home. Does not use 02 ra  Shortness of Breath 0 -> 5 scale with 5 being worst (score 6 If unable to do)     At rest 2 - 0 1  Simple tasks - showers, clothes change, eating, shaving 2 3 0 1  Household (dishes, doing bed, laundry) 5 3 3 2   Shopping 5 x x 3  Walking level at own pace 4 2 0 2  Walking up Stairs 5 3.5 5 4.5  Total (30-36) Dyspnea Score 23 11.5 8 13.5  How bad is your cough? x 1 1 0  How bad is your fatigue x 4 4 3   How bad is nausea x 0 0 1  How bad is vomiting?   x 0 0 0  How bad is diarrhea? x 2 0 0  How bad is anxiety? x 3 4 4.5  How bad is depression x 1 0 0      Simple office walk 185 feet x  3 laps goal with forehead  probe 9/17/Rush  01/01/2021  02/17/2023   O2 used ra ra ra  Number laps completed 3 3 Sit stand x 15  Comments about pace normal avg good  Resting Pulse Ox/HR 98% and 101/min 99% and 95/min 98% and ?HR 96  Final Pulse Ox/HR 95% and 118/min 97% and 100/min 97% and HRR 115  Desaturated </= 88% x x   Desaturated <= 3% points c=x x   Got Tachycardic >/= 90/min x yes   Symptoms at end of test x Some dyspnea on last lap Mild 2 of 10 dyspna  Miscellaneous comments x x         CT Chest data  HRCT 03/02/23- personally visualized and independently interpreted and my findings are: agree with vbelo   Narrative & Impression  CLINICAL DATA:  62 year old Caucasian female with history of shortness of  breath. Lower chest wall pain.   EXAM: CT CHEST WITHOUT CONTRAST   TECHNIQUE: Multidetector CT imaging of the chest was performed following the standard protocol without intravenous contrast. High resolution imaging of the lungs, as well as inspiratory and expiratory imaging, was performed.   RADIATION DOSE REDUCTION: This exam was performed according to the departmental dose-optimization program which includes automated exposure control, adjustment of the mA and/or kV according to patient size and/or use of iterative reconstruction technique.   COMPARISON:  Multiple priors, most recently high-resolution chest CT 11/19/2021.   FINDINGS: Cardiovascular: Heart size is normal. There is no significant pericardial fluid, thickening or pericardial calcification. There is aortic atherosclerosis, as well as atherosclerosis of the great vessels of the mediastinum and the coronary arteries, including calcified atherosclerotic plaque in the left main coronary artery. Left-sided pacemaker/AICD device with lead tips terminating in the right atrium and right ventricular apex.   Mediastinum/Nodes: No pathologically enlarged mediastinal or hilar lymph nodes. Please note that accurate exclusion of hilar adenopathy is limited on noncontrast CT scans. Esophagus is unremarkable in appearance. No axillary lymphadenopathy.   Lungs/Pleura: Mild diffuse bronchial wall thickening with mild centrilobular and paraseptal emphysema, most evident in the upper lungs. Patchy areas of very mild peripheral predominant ground-glass attenuation, septal thickening and regional architectural distortion are noted, including some thick-walled cystic spaces in the upper lungs bilaterally, which appear likely to reflect fibrosis associated with underlying emphysematous regions (rather than true areas of honeycombing). No traction bronchiectasis. Relative sparing of the lung bases. Inspiratory and expiratory imaging  demonstrates minimal air trapping indicative of very mild small airways disease. No acute consolidative airspace disease. No pleural effusions. No definite suspicious appearing pulmonary nodules or masses are noted.   Upper Abdomen: Aortic atherosclerosis.   Musculoskeletal: There are no aggressive appearing lytic or blastic lesions noted in the visualized portions of the skeleton.   IMPRESSION: 1. The appearance of the lungs remains most compatible with an alternative diagnosis (not usual interstitial pneumonia) per current ATS guidelines. No progression of disease compared to the prior study. 2. Mild centrilobular and paraseptal emphysema; imaging findings suggestive of underlying COPD. 3. Aortic atherosclerosis, in addition to left main coronary artery disease. Please note that although the presence of coronary artery calcium documents the presence of coronary artery disease, the severity of this disease and any potential stenosis cannot be assessed on this non-gated CT examination. Assessment for potential risk factor modification, dietary therapy or pharmacologic therapy may be warranted, if clinically indicated.   Aortic Atherosclerosis (ICD10-I70.0) and Emphysema (ICD10-J43.9).     Electronically Signed   By: Trudie Reed M.D.   On: 03/08/2023  15:19     PFT     Latest Ref Rng & Units 02/07/2023    3:07 PM 12/13/Rush   11:15 AM 7/16/Rush   12:46 PM  PFT Results  FVC-Pre L 2.76   2.25   FVC-Predicted Pre % 86  78  69   FVC-Post L 2.62     FVC-Predicted Post % 82     Pre FEV1/FVC % % 82  82  85   Post FEV1/FCV % % 85     FEV1-Pre L 2.26  2.08  1.92   FEV1-Predicted Pre % 91  81  75   FEV1-Post L 2.22     DLCO uncorrected ml/min/mmHg 20.27  15.24  14.04   DLCO UNC% % 103  77  71   DLCO corrected ml/min/mmHg 20.27   13.23   DLCO COR %Predicted % 103   66   DLVA Predicted % 120  86  97   TLC L 4.49     TLC % Predicted % 91     RV % Predicted % 96        PFT  Results for Anne, Rush (MRN [id number]) as of 01/01/2021 09:20  Ref. Range 7/16/Rush 12:46 12/13/Rush 11:15 12/15/20 at [facility]  FVC-Pre Latest Units: L 2.25  2.53  FVC-%Pred-Pre Latest Units: % 69 78 78%   Results for Anne, Rush (MRN [id number]) as of 01/01/2021 09:20  Ref. Range 7/16/Rush 12:46 12/13/Rush 11:15 12/15/20 [facility]  DLCO unc Latest Units: ml/min/mmHg 14.04 15.24 14.5  DLCO unc % pred Latest Units: % 71 77 73%      Latest Reference Range & Units 12/16/09 09:47 08/27/10 11:32 10/12/10 09:33 03/08/12 15:51 03/12/13 09:28 04/24/14 15:38 05/12/15 09:01 05/22/15 15:42 02/26/16 15:48 03/16/16 16:39 01/09/18 08:49 04/13/19 08:12 04/01/20 14:49 04/03/20 05:18 04/04/20 04:40 04/05/20 10:59 04/09/20 11:12 07/13/20 12:11 07/15/20 10:02 07/16/20 05:21 07/17/20 04:41 07/18/20 06:27 07/19/20 06:24 07/20/20 05:23 08/06/20 09:16 10/13/20 11:11 10/19/21 19:55 09/02/22 18:27 09/03/22 03:29 09/15/22 11:22 12/01/22 08:30  Hemoglobin 12.0 - 15.0 g/dL 30.1 60.1 (H) 09.3 (H) 14.5 14.8 15.1 (H) 15.1 (H) 15.4 15.8 (H) 15.4 (H) 15.1 (H) 14.2 15.3 (H) 15.2 (H) 15.1 (H) 13.7 15.4 (H) 14.2 12.7 11.9 (L) 12.9 12.5 12.7 11.9 (L) 13.3 14.3 14.8 15.6 (H) 14.3 14.1 14.3  (H): Data is abnormally high (L): Data is abnormally low ECHO    has a past medical history of Chronic systolic CHF (congestive heart failure) (HCC) (10/26/Rush), Complete heart block (HCC) (02/26/2016), Family history of ovarian cancer, Generalized anxiety disorder (10/26/Rush), GERD (gastroesophageal reflux disease), History of kidney stones, Hyperlipidemia, Hypertension, Interstitial lung disease (HCC), Pollen allergies, Presence of permanent cardiac pacemaker, Pulmonary fibrosis (HCC), Squamous cell carcinoma (2014), and Subclinical hyperthyroidism (06/17/2014).   reports that she quit smoking about 12 years ago. Her smoking use included cigarettes. She has a 15.00 pack-year smoking history. She has never used smokeless  tobacco.  Past Surgical History:  Procedure Laterality Date   CESAREAN SECTION     COSMETIC SURGERY     skin cancer face   CYSTOSCOPY W/ URETERAL STENT PLACEMENT Left 10/26/Rush   Procedure: CYSTOSCOPY WITH RETROGRADE PYELOGRAM/URETERAL STENT PLACEMENT;  Surgeon: Riki Altes, MD;  Location: [facility] ORS;  Service: Urology;  Laterality: Left;   CYSTOSCOPY/URETEROSCOPY/HOLMIUM LASER/STENT PLACEMENT Left 11/8/Rush   Procedure: CYSTOSCOPY/URETEROSCOPY/HOLMIUM LASER/STENT Exchange;  Surgeon: Vanna Scotland, MD;  Location: [facility] ORS;  Service: Urology;  Laterality: Left;   EP IMPLANTABLE DEVICE N/A 02/26/2016   Procedure: Pacemaker Implant;  Surgeon: Will Jorja Loa, MD;  Location: [facility];  Service: Cardiovascular;  Laterality: N/A;   TUBAL LIGATION      Allergies  Allergen Reactions   Codeine Hives and Itching   Sulfonamide Derivatives Hives   Paroxetine Other (See Comments)    unknown   Lisinopril Rash   Losartan Potassium Other (See Comments)    Leg felt heavy   Neomycin-Polymyxin-Dexameth Itching, Swelling and Other (See Comments)    Ey drop/Swelling and itching of eye    Oxycodone-Acetaminophen Other (See Comments)    Mild SOB, feels funny   Sulfa Antibiotics Hives and Rash         Immunization History  Administered Date(s) Administered   Influenza Nasal 06/20/2017   Influenza Whole 07/04/2008, 06/18/2009, 08/27/2010   Influenza, Seasonal, Injecte, Preservative Fre 09/03/2010, 06/22/2011, 06/27/2012   Influenza,inj,Quad PF,6+ Mos 07/21/2015, 07/06/2018, 08/10/2021   Influenza-Unspecified 06/27/2012, 07/17/2014, 07/19/2016, 07/21/2019, 07/17/2022   Td 05/10/2002   Tdap 10/28/2014   Zoster Recombinat (Shingrix) 12/08/2022, 02/08/2023    Family History  Problem Relation Age of Onset   Anxiety disorder Mother    Hypertension Mother    Ovarian cancer Mother 81   Cancer Maternal Aunt        breast   Heart failure Maternal Grandfather      Current  Outpatient Medications:    acetaminophen (TYLENOL) 500 MG tablet, Take 1,000 mg by mouth every 6 (six) hours as needed. As needed, Disp: , Rfl:    albuterol (VENTOLIN HFA) 108 (90 Base) MCG/ACT inhaler, Inhale 2 puffs into the lungs every 6 (six) hours as needed for wheezing or shortness of breath., Disp: , Rfl:    carvedilol (COREG) 25 MG tablet, TAKE ONE TABLET BY MOUTH TWICE A DAY WITH MEALS, Disp: 180 tablet, Rfl: 3   diazepam (VALIUM) 5 MG tablet, TAKE ONE TABLET BY MOUTH EVERY 8 HOURS AS NEEDED FOR ANXIETY, Disp: 30 tablet, Rfl: 3   famotidine (PEPCID) 20 MG tablet, Take 1 tablet (20 mg total) by mouth daily., Disp: 30 tablet, Rfl: 0   fluticasone (FLONASE) 50 MCG/ACT nasal spray, Place 2 sprays into both nostrils daily., Disp: , Rfl:    spironolactone (ALDACTONE) 25 MG tablet, TAKE 1 TABLET BY MOUTH DAILY, Disp: 90 tablet, Rfl: 3 No current facility-administered medications for this visit.  Facility-Administered Medications Ordered in Other Visits:    albuterol (PROVENTIL) (2.5 MG/3ML) 0.083% nebulizer solution 2.5 mg, 2.5 mg, Nebulization, Once, Kalman Shan, MD      Objective:   Vitals:   03/17/23 1016  Weight: 210 lb (95.3 kg)  Height: 5' 2.5" (1.588 m)    Estimated body mass index is 37.8 kg/m as calculated from the following:   Height as of this encounter: 5' 2.5" (1.588 m).   Weight as of this encounter: 210 lb (95.3 kg).  @WEIGHTCHANGE @  [employer]   03/17/23 1016  Weight: 210 lb (95.3 kg)     Physical Exam   General: No distress.  O2 at rest: no Cane present: no Sitting in wheel chair: no Frail: no  Neuro: Alert and Oriented x 3. GCS 15. Speech normal Psych: Pleasant        Assessment:       ICD-10-CM   1. SOB (shortness of breath)  R06.02     2. Interstitial lung disease (HCC)  J84.9     3. Paraseptal emphysema (HCC)  J43.8     4. Coronary artery calcification seen on CAT scan  I25.10  5. Chronic systolic CHF (congestive heart  failure) (HCC)  I50.22          Plan:     Patient Instructions     ICD-10-CM   1. SOB (shortness of breath)  R06.02     2. Interstitial lung disease (HCC)  J84.9     3. Paraseptal emphysema (HCC)  J43.8     4. Coronary artery calcification seen on CAT scan  I25.10     5. Chronic systolic CHF (congestive heart failure) (HCC)  I50.22       -High-resolution CT scan of the chest shows continued stability with your pulmonary fibrosis.  My personal visualization it is mild.  There is some emphysema from previous smoking.  On the CT scan of the chest and this could be contributing to some amount of shortness of breath and cough [currently untreated].  Would be curious to see what your heart ejection fraction is to see if that is another contributor to your shortness of breath worsening  -There is also coronary artery calcification on CT scan -and sometimes is associated with heart blood vessel blockage  Plan  - -Start empiric sample Spiriva for 1 month [you can pick this up at the [facility] clinic] -2 puffs once daily  -If this is helping you call us in 1 month and we will send in prescription -Await echocardiogram results; if you do not hear from [place] by March 23, 2023 with the results please call us -Do meet with cardiology about your coronary artery calcification you have never had a stress test before -Do spirometry and DLCO in 4 months  Followup  -Return to see Dr. Marchelle Gearing for a 15-minute visit in 4 months after pulmonary function testing  (Level 04 E&M 2024: Estb >= 30 min  visit spent in total care time and counseling or/and coordination of care by this undersigned MD - Dr Kalman Shan. This includes one or more of the following on this same day 03/17/2023: pre-charting, chart review, note writing, documentation discussion of test results, diagnostic or treatment recommendations, prognosis, risks and benefits of management options, instructions, education, compliance or  risk-factor reduction. It excludes time spent by the CMA or office staff in the care of the patient . Actual time is 30 min)   SIGNATURE    Dr. Kalman Shan, M.D., F.C.C.P,  Pulmonary and Critical Care Medicine Staff Physician, [facility] Director - Interstitial Lung Disease  Program  Pulmonary Fibrosis [facility] at [facility] [place], [place], [address]  Pager: [phone], If no answer or between  15:00h - 7:00h: call [phone] Telephone: [phone]  10:40 AM 03/17/2023

## 2023-03-17 NOTE — Telephone Encounter (Signed)
Received message from [place] office to place two sample of spiriva up front for pt to pickup.  Verified dosage with MR via epic secure chat.   Patient aware that samples are ready for pickup.   Nothing further needed

## 2023-03-17 NOTE — Addendum Note (Signed)
Addended by: Lajoyce Lauber A on: 03/17/2023 11:46 AM   Modules accepted: Orders

## 2023-03-30 DIAGNOSIS — Z85828 Personal history of other malignant neoplasm of skin: Secondary | ICD-10-CM | POA: Diagnosis not present

## 2023-03-30 DIAGNOSIS — D2372 Other benign neoplasm of skin of left lower limb, including hip: Secondary | ICD-10-CM | POA: Diagnosis not present

## 2023-03-30 DIAGNOSIS — D2371 Other benign neoplasm of skin of right lower limb, including hip: Secondary | ICD-10-CM | POA: Diagnosis not present

## 2023-03-30 DIAGNOSIS — Z08 Encounter for follow-up examination after completed treatment for malignant neoplasm: Secondary | ICD-10-CM | POA: Diagnosis not present

## 2023-03-30 NOTE — Progress Notes (Signed)
Remote pacemaker transmission.   

## 2023-03-31 ENCOUNTER — Ambulatory Visit: Payer: BC Managed Care – PPO | Admitting: Pulmonary Disease

## 2023-04-01 NOTE — Progress Notes (Signed)
Echo EF is normal. Mild heart muscle stiffness that can explain some dyspnea. Please keep visit with cardiology. We will not be calling in with this result

## 2023-04-06 ENCOUNTER — Ambulatory Visit: Payer: BC Managed Care – PPO | Attending: Cardiology | Admitting: Cardiology

## 2023-04-06 ENCOUNTER — Encounter: Payer: Self-pay | Admitting: *Deleted

## 2023-04-06 ENCOUNTER — Encounter: Payer: Self-pay | Admitting: Cardiology

## 2023-04-06 VITALS — BP 142/96 | HR 72 | Ht 63.0 in | Wt 212.0 lb

## 2023-04-06 DIAGNOSIS — I2510 Atherosclerotic heart disease of native coronary artery without angina pectoris: Secondary | ICD-10-CM | POA: Diagnosis not present

## 2023-04-06 DIAGNOSIS — R0609 Other forms of dyspnea: Secondary | ICD-10-CM

## 2023-04-06 DIAGNOSIS — I442 Atrioventricular block, complete: Secondary | ICD-10-CM | POA: Diagnosis not present

## 2023-04-06 DIAGNOSIS — I10 Essential (primary) hypertension: Secondary | ICD-10-CM

## 2023-04-06 NOTE — Patient Instructions (Addendum)
Medication Instructions:  Your physician recommends that you continue on your current medications as directed. Please refer to the Current Medication list given to you today.  *If you need a refill on your cardiac medications before your next appointment, please call your pharmacy*   Lab Work: Today: Fasting lipids If you have labs (blood work) drawn today and your tests are completely normal, you will receive your results only by: MyChart Message (if you have MyChart) OR A paper copy in the mail If you have any lab test that is abnormal or we need to change your treatment, we will call you to review the results.   Testing/Procedures: Your physician has requested that you have cardiac CT. Cardiac computed tomography (CT) is a painless test that uses an x-ray machine to take clear, detailed pictures of your heart. For further information please visit [email or web address] Please follow instruction below.    Follow-Up: At [facility], you and your health needs are our priority.  As part of our continuing mission to provide you with exceptional heart care, we have created designated Provider Care Teams.  These Care Teams include your primary Cardiologist (physician) and Advanced Practice Providers (APPs -  Physician Assistants and Nurse Practitioners) who all work together to provide you with the care you need, when you need it.  Remote monitoring is used to monitor your Pacemaker or ICD from home. This monitoring reduces the number of office visits required to check your device to one time per year. It allows [place] to keep an eye on the functioning of your device to ensure it is working properly. You are scheduled for a device check from home on 06/09/2023. You may send your transmission at any time that day. If you have a wireless device, the transmission will be sent automatically. After your physician reviews your transmission, you will receive a postcard with your next transmission  date.  Your next appointment:   1 year(s)  The format for your next appointment:   In Person  Provider:   Loman Brooklyn, MD    Thank you for choosing [facility]!!   Dory Horn, RN [phone]    Other Instructions    Your cardiac CT will be scheduled at one of the below locations:   [facility] [address] [place], [place] [address] [phone]  OR  [facility] [address] [place], [place] [address] [phone]  OR   [facility] [address] [place], [place] [address] [phone]  If scheduled at [facility], please arrive at the [facility] Entrance (Entrance C2) of [facility] 30 minutes prior to test start time. You can use the FREE valet parking offered at entrance C (encouraged to control the heart rate for the test)  Proceed to the [facility] Radiology Department (first floor) to check-in and test prep.  All radiology patients and guests should use entrance C2 at [facility], accessed from [facility], even though the hospital's physical address listed is [address]    If scheduled at [facility] or [facility], please arrive 15 mins early for check-in and test prep.  There is spacious parking and easy access to the radiology department from the [facility] Heart and Vascular entrance. Please enter here and check-in with the desk attendant.   Please follow these instructions carefully (unless otherwise directed):  An IV will be required for  this test and Nitroglycerin will be given.  Hold all erectile dysfunction medications at least 3 days (72 hrs) prior to test. (Ie viagra, cialis, sildenafil, tadalafil, etc)   On the Night Before the Test: Be sure to Drink plenty of water. Do not consume any caffeinated/decaffeinated beverages or chocolate 12  hours prior to your test. Do not take any antihistamines 12 hours prior to your test. If the patient has contrast allergy: Patient will need a prescription for Prednisone and very clear instructions (as follows): Prednisone 50 mg - take 13 hours prior to test Take another Prednisone 50 mg 7 hours prior to test Take another Prednisone 50 mg 1 hour prior to test Take Benadryl 50 mg 1 hour prior to test Patient must complete all four doses of above prophylactic medications. Patient will need a ride after test due to Benadryl.  On the Day of the Test: Drink plenty of water until 1 hour prior to the test. Do not eat any food 1 hour prior to test. You may take your regular medications prior to the test.  Take metoprolol (Lopressor) two hours prior to test. If you take Furosemide/Hydrochlorothiazide/Spironolactone, please HOLD on the morning of the test. FEMALES- please wear underwire-free bra if available, avoid dresses & tight clothing      After the Test: Drink plenty of water. After receiving IV contrast, you may experience a mild flushed feeling. This is normal. On occasion, you may experience a mild rash up to 24 hours after the test. This is not dangerous. If this occurs, you can take Benadryl 25 mg and increase your fluid intake. If you experience trouble breathing, this can be serious. If it is severe call 911 IMMEDIATELY. If it is mild, please call our office. If you take any of these medications: Glipizide/Metformin, Avandament, Glucavance, please do not take 48 hours after completing test unless otherwise instructed.  We will call to schedule your test 2-4 weeks out understanding that some insurance companies will need an authorization prior to the service being performed.   For more information and frequently asked questions, please visit our website : [email or web address]  For non-scheduling related questions, please contact the cardiac imaging nurse navigator should  you have any questions/concerns: Cardiac Imaging Nurse Navigators Direct Office Dial: [phone]   For scheduling needs, including cancellations and rescheduling, please call [place], [phone].

## 2023-04-06 NOTE — Progress Notes (Signed)
  Electrophysiology Office Note:   Date:  04/06/2023  ID:  Anne Rush, DOB 07/23/1961, MRN [id number]  Primary Cardiologist: Anne Harps, MD Electrophysiologist: Anne Lemming, MD      History of Present Illness:   Anne Rush is a 62 y.o. female with h/o complete heart block seen today for routine electrophysiology followup.  Since last being seen in our clinic the patient reports doing well over the last few months.  She is able to do most of her daily activities, though she does continue to have shortness of breath.  She was previously having chest pain.  She had an echo that showed a normal ejection fraction and a CT scan that showed coronary calcifications and possible coronary artery disease.  she denies chest pain, palpitations, PND, orthopnea, nausea, vomiting, dizziness, syncope, edema, weight gain, or early satiety.   Review of systems complete and found to be negative unless listed in HPI.      EP Information / Studies Reviewed:    EKG is ordered today. Personal review as below.  EKG Interpretation Date/Time:  Wednesday April 06 2023 08:32:57 EDT Ventricular Rate:  72 PR Interval:  198 QRS Duration:  156 QT Interval:  424 QTC Calculation: 464 R Axis:   -71  Text Interpretation: Atrial-sensed ventricular-paced rhythm When compared with ECG of 02-Sep-2022 18:42, Rate slower Confirmed by Anne Rush ([id number]) on 04/06/2023 8:37:59 AM   PPM Interrogation-  reviewed in detail today,  See PACEART report.  Device History: Abbott Dual Chamber PPM implanted 02/26/16 for CHB  Risk Assessment/Calculations:      Physical Exam:   VS:  BP (!) 142/96   Pulse 72   Ht 5\' 3"  (1.6 m)   Wt 212 lb (96.2 kg)   SpO2 95%   BMI 37.55 kg/m    Wt Readings from Last 3 Encounters:  04/06/23 212 lb (96.2 kg)  03/17/23 210 lb (95.3 kg)  02/17/23 211 lb 12.8 oz (96.1 kg)     GEN: Well nourished, well developed in no acute distress NECK: No JVD; No carotid bruits CARDIAC:  Regular rate and rhythm, no murmurs, rubs, gallops RESPIRATORY:  Clear to auscultation without rales, wheezing or rhonchi  ABDOMEN: Soft, non-tender, non-distended EXTREMITIES:  No edema; No deformity   ASSESSMENT AND PLAN:    1.  CHB s/p Abbott PPM  Normal PPM function See Pace Art report No changes today  2.  Hypertension: Mildly elevated today.  Usually well-controlled.  No changes.  3.  Chronic systolic heart failure: Currently on carvedilol.  Low normal ejection fraction on most recent echo.  Continue with current management.  4.  Severe obstructive sleep apnea: Followed by pulmonary clinic.  5.  Coronary artery disease: Was found chest CT.  She does have shortness of breath with exertion.  At this point unable to quantify her coronary artery disease.  Due to her shortness of breath with exertion and history of chest pain, Anne Rush plan for coronary CTA.  Disposition:   Follow up with Dr. Elberta Rush in 12 months  Signed, Anne Daleo Jorja Loa, MD

## 2023-04-07 ENCOUNTER — Encounter: Payer: Self-pay | Admitting: Cardiology

## 2023-04-07 LAB — LIPID PANEL
Chol/HDL Ratio: 4.6 ratio — ABNORMAL HIGH (ref 0.0–4.4)
Cholesterol, Total: 228 mg/dL — ABNORMAL HIGH (ref 100–199)
HDL: 50 mg/dL (ref >39)
LDL Chol Calc (NIH): 135 mg/dL — ABNORMAL HIGH (ref 0–99)
Triglycerides: 239 mg/dL — ABNORMAL HIGH (ref 0–149)
VLDL Cholesterol Cal: 43 mg/dL — ABNORMAL HIGH (ref 5–40)

## 2023-04-09 DIAGNOSIS — J9601 Acute respiratory failure with hypoxia: Secondary | ICD-10-CM | POA: Diagnosis not present

## 2023-04-09 DIAGNOSIS — I5189 Other ill-defined heart diseases: Secondary | ICD-10-CM | POA: Diagnosis not present

## 2023-04-28 ENCOUNTER — Ambulatory Visit (HOSPITAL_COMMUNITY): Payer: BC Managed Care – PPO

## 2023-05-02 ENCOUNTER — Encounter: Payer: Self-pay | Admitting: Pulmonary Disease

## 2023-05-04 ENCOUNTER — Other Ambulatory Visit: Payer: Self-pay

## 2023-05-04 MED ORDER — CARVEDILOL 25 MG PO TABS: 25.0000 mg | ORAL_TABLET | Freq: Two times a day (BID) | ORAL | 3 refills | Status: DC

## 2023-05-10 DIAGNOSIS — I5189 Other ill-defined heart diseases: Secondary | ICD-10-CM | POA: Diagnosis not present

## 2023-05-10 DIAGNOSIS — J9601 Acute respiratory failure with hypoxia: Secondary | ICD-10-CM | POA: Diagnosis not present

## 2023-05-27 ENCOUNTER — Encounter (HOSPITAL_COMMUNITY): Payer: Self-pay

## 2023-06-09 ENCOUNTER — Ambulatory Visit (INDEPENDENT_AMBULATORY_CARE_PROVIDER_SITE_OTHER): Payer: BC Managed Care – PPO

## 2023-06-09 DIAGNOSIS — I442 Atrioventricular block, complete: Secondary | ICD-10-CM | POA: Diagnosis not present

## 2023-06-09 LAB — CUP PACEART REMOTE DEVICE CHECK
Battery Remaining Longevity: 22 mo
Battery Remaining Percentage: 18 %
Battery Voltage: 2.86 V
Brady Statistic AP VP Percent: 2.4 %
Brady Statistic AP VS Percent: 1 %
Brady Statistic AS VP Percent: 98 %
Brady Statistic AS VS Percent: 1 %
Brady Statistic RA Percent Paced: 2.3 %
Brady Statistic RV Percent Paced: 99 %
Date Time Interrogation Session: 20240919030803
Implantable Lead Connection Status: 753985
Implantable Lead Connection Status: 753985
Implantable Lead Implant Date: 20170608
Implantable Lead Implant Date: 20170608
Implantable Lead Location: 753859
Implantable Lead Location: 753860
Implantable Pulse Generator Implant Date: 20170608
Lead Channel Impedance Value: 430 Ohm
Lead Channel Impedance Value: 450 Ohm
Lead Channel Pacing Threshold Amplitude: 1 V
Lead Channel Pacing Threshold Amplitude: 1.25 V
Lead Channel Pacing Threshold Pulse Width: 0.4 ms
Lead Channel Pacing Threshold Pulse Width: 0.4 ms
Lead Channel Sensing Intrinsic Amplitude: 4.1 mV
Lead Channel Setting Pacing Amplitude: 1.5 V
Lead Channel Setting Pacing Amplitude: 2 V
Lead Channel Setting Pacing Pulse Width: 0.4 ms
Lead Channel Setting Sensing Sensitivity: 2.5 mV
Pulse Gen Model: 2272
Pulse Gen Serial Number: 7903881

## 2023-06-10 DIAGNOSIS — J9601 Acute respiratory failure with hypoxia: Secondary | ICD-10-CM | POA: Diagnosis not present

## 2023-06-10 DIAGNOSIS — I5189 Other ill-defined heart diseases: Secondary | ICD-10-CM | POA: Diagnosis not present

## 2023-06-16 ENCOUNTER — Telehealth: Payer: Self-pay | Admitting: Internal Medicine

## 2023-06-16 NOTE — Telephone Encounter (Signed)
Called pt to get her sch for her PFT. She stated she wants to get her PFT done at [facility]

## 2023-06-21 ENCOUNTER — Other Ambulatory Visit: Payer: Self-pay

## 2023-06-21 DIAGNOSIS — J849 Interstitial pulmonary disease, unspecified: Secondary | ICD-10-CM

## 2023-06-21 NOTE — Telephone Encounter (Signed)
I have spoke with Anne Rush and her PFT has been scheduled on 07/14/23 @ 9:00am at [facility] Entrance. She is aware of the appt and location

## 2023-06-21 NOTE — Progress Notes (Signed)
Remote pacemaker transmission.   

## 2023-06-27 DIAGNOSIS — H0012 Chalazion right lower eyelid: Secondary | ICD-10-CM | POA: Diagnosis not present

## 2023-07-10 DIAGNOSIS — J9601 Acute respiratory failure with hypoxia: Secondary | ICD-10-CM | POA: Diagnosis not present

## 2023-07-10 DIAGNOSIS — I5189 Other ill-defined heart diseases: Secondary | ICD-10-CM | POA: Diagnosis not present

## 2023-07-14 ENCOUNTER — Ambulatory Visit: Payer: BC Managed Care – PPO | Attending: Internal Medicine

## 2023-07-14 DIAGNOSIS — Z87891 Personal history of nicotine dependence: Secondary | ICD-10-CM | POA: Diagnosis not present

## 2023-07-14 DIAGNOSIS — J849 Interstitial pulmonary disease, unspecified: Secondary | ICD-10-CM | POA: Diagnosis not present

## 2023-07-14 LAB — PULMONARY FUNCTION TEST ARMC ONLY
DL/VA % pred: 90 %
DL/VA: 3.83 ml/min/mmHg/L
DLCO unc % pred: 78 %
DLCO unc: 15.38 ml/min/mmHg
FEF 25-75 Pre: 2.55 L/s
FEF2575-%Pred-Pre: 111 %
FEV1-%Pred-Pre: 91 %
FEV1-Pre: 2.26 L
FEV1FVC-%Pred-Pre: 107 %
FEV6-%Pred-Pre: 88 %
FEV6-Pre: 2.71 L
FEV6FVC-%Pred-Pre: 103 %
FVC-%Pred-Pre: 85 %
FVC-Pre: 2.71 L
Pre FEV1/FVC ratio: 84 %
Pre FEV6/FVC Ratio: 100 %

## 2023-07-19 DIAGNOSIS — H0012 Chalazion right lower eyelid: Secondary | ICD-10-CM | POA: Diagnosis not present

## 2023-07-21 ENCOUNTER — Other Ambulatory Visit: Payer: Self-pay | Admitting: Family Medicine

## 2023-07-21 DIAGNOSIS — H0012 Chalazion right lower eyelid: Secondary | ICD-10-CM | POA: Diagnosis not present

## 2023-07-22 NOTE — Telephone Encounter (Signed)
Last office visit 01/17/2023 for Sinusitis.  Last refilled 03/09/2023 for #30 with 3 refills.  Next Appt: No future appointments with PCP.

## 2023-08-10 DIAGNOSIS — J9601 Acute respiratory failure with hypoxia: Secondary | ICD-10-CM | POA: Diagnosis not present

## 2023-08-10 DIAGNOSIS — I5189 Other ill-defined heart diseases: Secondary | ICD-10-CM | POA: Diagnosis not present

## 2023-08-12 DIAGNOSIS — H0100A Unspecified blepharitis right eye, upper and lower eyelids: Secondary | ICD-10-CM | POA: Diagnosis not present

## 2023-08-12 DIAGNOSIS — H11821 Conjunctivochalasis, right eye: Secondary | ICD-10-CM | POA: Diagnosis not present

## 2023-08-23 ENCOUNTER — Encounter: Payer: Self-pay | Admitting: Internal Medicine

## 2023-08-23 ENCOUNTER — Ambulatory Visit: Payer: BC Managed Care – PPO | Admitting: Internal Medicine

## 2023-08-23 VITALS — BP 128/86 | HR 73 | Ht 63.0 in | Wt 215.0 lb

## 2023-08-23 DIAGNOSIS — Z7185 Encounter for immunization safety counseling: Secondary | ICD-10-CM | POA: Diagnosis not present

## 2023-08-23 DIAGNOSIS — Z23 Encounter for immunization: Secondary | ICD-10-CM

## 2023-08-23 DIAGNOSIS — J438 Other emphysema: Secondary | ICD-10-CM

## 2023-08-23 DIAGNOSIS — J849 Interstitial pulmonary disease, unspecified: Secondary | ICD-10-CM

## 2023-08-23 NOTE — Progress Notes (Signed)
[name]      History     62 y/o F, former smoker, who presented to [facility] on 7/13 with worsening shortness of breath.    The patient reports her increase in shortness of breath began around the first of July.  She had noted her oxygen levels to be in the high 80's at home.  She reports progressively increased shortness of breath, dyspnea with exertion and difficulty walking a flight of stairs.  She has also noted a tightness in the middle of her chest.  She has not had a COVID vaccine.     The patient is a Conservator, museum/gallery.  She is a former smoker quit 2012 after diagnosis of ILD.  She was previously followed by Pulmonary at [facility] (Dr. Ramond Rush, last seen in 2016) for ground glass opacities but has not seen them in several years due to financial constraints. She was supposed to have a lung biopsy. Her symptoms at the time were thought to be RBILD.  At that time she quit smoking and her FEV1 increased from 85 to >90 and infiltrates improved on CT.    ER evaluation included a CTA chest which was negative for PE but showed worsening ILD from her prior CT scan.  Troponin flat.     PCCM consulted for pulmonary evaluation.   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  Since it started 1 month ago is getting worse.  There is episodic dyspnea.  Severity is listed below.  She also has a cough that she reports specifically started on Monday, March 31, 2020.  Since it started and with steroids it is better.  It is mild in intensity.  She does bring up some phlegm that is green.  A separate brown speckles.  There is no hemoptysis.  Cough does not get worse when she lies down.  Does not affect her voice.  Does not clear her throat.  No tickle.  All the symptoms started  approximately 5 weeks before admission.        Past Medical History : Positive for heart failure.  Third-degree heart block she has a pacer.  This has been going on for several years.  Currently ejection fraction reported as slightly low normal but according to Dr. Rito Rush and the hospitalist cardiology reviewed the echo again and feel the ejection fraction is normal.  Current BNP is normal.  She has obesity.  Otherwise negative past medical history.  No COPD or asthma.  No collagen vascular disease no scleroderma no lupus no polymyositis.  Autoimmune panel is all negative.  No stroke or thyroid disease.  No hepatitis.  No history of blood clot.     ROS: Positive for dyspnea.  Was stable dyspnea all the way from 2012 up until recently 5 weeks ago.  There is associated with fatigue and arthralgia.  No dysphagia.  No acid reflux.  No vomiting no nausea.     FAMILY HISTORY of LUNG DISEASE: Positive for COPD in the diet and asthma in the past.  No history of pulmonary fibrosis.  No cystic fibrosis denies hypersensitive pneumonitis no autoimmune disease.     EXPOSURE HISTORY:  -She did smoke cigarettes between 1979 and 2012.  She quit smoking in 2012 when her  interstitial lung disease was diagnosed.  She smoked around 10 cigarettes a day.  No cigars.  No passive smoking.  No vaping.  No marijuana use.  No cocaine use.  No intravenous drug use.     HOME and HOBBY DETAILS : [place]  Is a single-family home is also 62 years of age.  In the house there is no dampness.  There is no mold or mildew in the shower curtain.  There is no mold or mildew anywhere in the house.  There is no humidifier in the house no CPAP use in the house.  No nebulizer machine.  No steam iron.  No Jacuzzi.  No misting Fountain in the house.  No pet birds or parakeets.  No pet gerbils.  No feather pillows or blankets.  No music habits.  No gardening habits.   She does throw prone to the deer but there is no mold in the:     OCCUPATIONAL HISTORY (122 questions) : There is significant water damage or mold exposure at work.  She works for [employer] since 2016.  Prior to this she was not working any atmosphere that had dampness of mold.  She states the building is a very old building.  She says that there has been significant water damage and water leak.  Water has port in into the several rooms in the office.  She says there is mold in the hallways.  There is mold in the break room.  There is mold in the bosses office.  She is not sure but she thinks there is mold even in her cubicle.  Is been going on for several years but increased in the last few years.  She says on her desk every morning there are black spots but she does not know what it is.  Her husband showed pictures of the office of the right leg significant dampness and rotting of the walls and floor.  She works as a Catering manager.  It is just her and her boss.  Rest of the questionnaire is negative.     PULMONARY TOXICITY HISTORY (27 items): Never been on urinary antibiotic Macrodantin.  Currently on prednisone.     RECENT LABS   Results for Anne Rush (MRN [id number]) as of 04/04/2020 12:42   Ref. Range 04/02/2020 18:57  Anti Nuclear Antibody (ANA) Latest Ref Range: Negative  Negative  ANCA Proteinase 3 Latest Ref Range: 0.0 - 3.5 U/mL <3.5  CCP Antibodies IgG/IgA Latest Ref Range: 0 - 19 units 4  Myeloperoxidase Abs Latest Ref Range: 0.0 - 9.0 U/mL <9.0  RA Latex Turbid. Latest Ref Range: 0.0 - 13.9 IU/mL <10.0      CT Chest High Resolution   Result Date: 04/03/2020 CLINICAL DATA:  Inpatient. Evaluate interstitial lung disease. Dyspnea. EXAM: CT CHEST WITHOUT CONTRAST TECHNIQUE: Multidetector CT imaging of the chest was performed following the standard protocol without intravenous contrast. High resolution imaging of the lungs, as well as inspiratory and expiratory imaging, was  performed. COMPARISON:  04/02/2020 chest CT angiogram.  10/08/2010 chest CT. FINDINGS: Cardiovascular: Normal heart size. No significant pericardial effusion/thickening. Two lead left subclavian pacemaker with lead tips in the right atrium and right ventricular apex. Mildly atherosclerotic nonaneurysmal thoracic aorta. Normal caliber pulmonary arteries. Mediastinum/Nodes: No discrete thyroid nodules. Unremarkable esophagus. No pathologically enlarged axillary, mediastinal or hilar lymph nodes, noting limited sensitivity for the detection of hilar  adenopathy on this noncontrast study. Lungs/Pleura: No pneumothorax. No pleural effusion. Mild centrilobular and paraseptal emphysema in the upper lobes. No acute consolidative airspace disease or lung masses. Right middle lobe solid 7 mm pulmonary nodule along the minor fissure (series 7/image 65), stable since 2012 CT, considered benign. No additional significant pulmonary nodules. Patchy confluent peripheral reticulation and peripheral lines throughout both lungs with associated mild traction bronchiectasis and architectural distortion, with regions of relative sparing of the immediate subpleural lungs. No clear apicobasilar gradient to these findings. No frank honeycombing (mild subpleural cystic changes in the upper lobes are favored represent paraseptal emphysema). These findings have progressed significantly since 10/08/2010 chest CT. Upper abdomen: No acute abnormality. Musculoskeletal: No aggressive appearing focal osseous lesions. Moderate thoracic spondylosis. IMPRESSION: 1. Spectrum of findings compatible with fibrotic interstitial lung disease without frank honeycombing or clear apicobasilar gradient. Findings have progressed since 2012 chest CT. The regions of immediate subpleural sparing are more suggestive of fibrotic phase nonspecific interstitial pneumonia (NSIP), although usual interstitial pneumonia (UIP) is on the differential particularly given  progression. Findings are indeterminate for UIP per consensus guidelines: Diagnosis of Idiopathic Pulmonary Fibrosis: An Official ATS/ERS/JRS/ALAT Clinical Practice Guideline. Am Rosezetta Schlatter Crit Care Med Vol 198, Iss 5, [phone], May 21 2017. 2. Aortic Atherosclerosis (ICD10-I70.0) and Emphysema (ICD10-J43.9). Electronically Signed   By: Delbert Phenix M.D.   On: 04/03/2020 17:23   COURS  Significant Hospital Events   7/13 Admit   7/15 -  she says in jan 2012 dx with ILD . Jan 2021 was CT. In may 2012 she quti smoking. Followed with Dr Antony Salmon. Last visit with him and CT was in 2015. But stable till June 2021 and desaturating with exercise at home. Now needing oxygen. She is nervous about procedures incluidng bronch. Feels better after steroids    04/04/2020 - > feels better. 87% on RA at rest now.  Husband reports mild confusion overnight.  He is wondering if the lorazepam, steroids and oxygen are contributing to this.  This morning she not as happy as and cheerful as before.  Otherwise no new complaints.  Overall she feels better since admission and attributes to steroids.  We went over the ILD questionnaire and is documented below.  Assessment & Plan:    #Baseline: Interstitial lung disease -in 2012.  Previous smoker and quit and stable   #Current: Significant mold exposure x4 years especially worse in the last few years.  Interstitial lung disease flareup with new oxygen dependency this admission.  High-resolution CT chest alternative pattern to UIP but no clear description of air trapping   -Discussion: She might have had RB ILD in the past or NSIP based on the CT scan appearance.  She is doing stable after quitting smoking but in the last few years she has had mold exposure and it has been a deterioration and presentation with symptoms suggestive of ILD flareup.  It appears that systolic heart failure is not under consideration especially with normal BNP.  Serologies are negative.  She does not  want any biopsy or invasive procedures.  Clinically this is not UIP/IPF.  There is no evidence of autoimmune interstitial lung disease.  Baseline features overall could be NSIP or hypersensitive pneumonitis [although no air-trapping] or possibly smokers interstitial lung disease.  However the current flareup seems temporally related to mold exposure.  Is the only inciting antigen that we can find currently.  And therefore this could be a flareup and progression of previous NSIP or she has  underlying hypersensitive pneumonitis.   Plan -Under no circumstances she should go back to her work atmosphere where there is mold exposure   -Needs prednisone for few to several weeks and may be 1 few months depending on the course [she seems to be improving although there is some mild confusion last night which could be multifactorial from benzodiazepines and steroids] -> okay to switch to p.o. prednisone April 05, 2020 at 50 mg/day x 2 weeks and then 40 mg/day x 2 weeks and then 30 mg/day x 2 weeks and then 20 mg/day x 2 weeks and then 10 mg/day to continue until further instructions   -Obtain hypersensitive pneumonitis blood panel while in the hospital and blood QuantiFERON gold   -Obtain spirometry and DLCO while in the hospital if possible   -Seems to still need oxygen at rest 2 L [today she is 87% on room air at rest although this is an improvement]   -Meet with Engineer, civil (consulting) for disability information which she is interested   - Prognosis: Unclear prognosis.  It is possible that with steroids over few to several weeks or a few months that she improves and goes back to baseline.  It is also possible that she has only partial improvement.  There is also a small chance she just deals with progressive ILD in which case antifibrotic's would be indicated as an outpatient.  Overall I am optimistic that she would have at least a partial improvement   -She would need to lose weight and get fit (given  age below 51 -in the long run she might be a candidate for lung transplant if the need arose] -we will address this in the office   -Hospitalist to deal with encephalopathy if present.  At this point in time I have discontinued the lorazepam but left on her home diazepam     - can go home 04/05/20= after social work and o2 asessment   She and her husband updated   Results for NAVADA, LITTLER (MRN [id number]) as of 04/03/2020 13:38   Ref. Range 10/12/2010 20:44 05/12/2015 09:18 05/12/2015 09:33 04/02/2020 18:57  Anti Nuclear Antibody (ANA) Latest Ref Range: NEGATIVE  NEG NEG      Cyclic Citrullin Peptide Ab Latest Ref Range: 0.0 - 5.0 U/mL   <2.0      ds DNA Ab Latest Units: IU/mL   1      RA Latex Turbid. Latest Ref Range: 0.0 - 13.9 IU/mL <10 IU/mL <10   <10.0  Tissue Transglut Ab Latest Ref Range: <6 U/mL   1      ENA SM Ab Ser-aCnc Latest Ref Range: <1.0 NEG AI      <1.0 NEG    Results for IVANELL, MARCINKO (MRN [id number]) as of 04/03/2020 13:38   Ref. Range 08/27/2010 11:32 10/12/2010 09:33 05/12/2015 09:01 04/02/2020 18:57  Sed Rate Latest Ref Range: 0 - 22 mm/hr 16 16 10 8       OV 06/06/2020  Subjective:  Patient ID: Anne Rush, female , DOB: 05/25/1961 , age 3 y.o. , MRN: [id number] , ADDRESS: [address] [place] [address]   06/06/2020 -   Chief Complaint  Patient presents with   Follow-up    breathing is doing well with 2L. c/o non prod cough.    #Baseline interstitial lung disease in 2012 with progression in July 2021.  High-resolution CT chest pattern with alternative diagnosis suggestive of NSIP but significant mold exposure at workplace leading up  to July 2021 although no air-trapping.  Started empiric prednisone mid July 2021.  Reluctant to have surgical lung biopsy or transbronchial biopsy of bronchoscopy lavage  HPI Anne Rush 62 y.o. -returns for follow-up.  Last seen in mid July 2021 in the hospital.  Since then she is seen nurse practitioner once towards  end of July 2021.  She reports overall improvement in symptoms but still has some dyspnea.  She says when she is on room air up and about in the house after 45 minutes to 1 hour her pulse ox nadir is around 88 to 92%.  She then uses oxygen to give her some relief.  She does not realize that oxygen is indicated if it is less than 88% only.  She says she is doing well with the prednisone overall although she has gained weight and has developed features of "moon face".  She continues to be hesitant about having any intervention to diagnose underlying ILD.  She is now off work and she is trying to pursue Social Security disability.  We did discuss the fact that if her condition improves she may not qualify for it.  She is not interested in [employer]. because she has good personal relationship with her previous Merchandiser, retail.  There is an invitation for her to work remotely and she is pondering that.  She does not have social short-term disability through her employer  In terms of vaccination:  She is going to have a flu shot in the month of October 2021 through primary care physician.  She has not had the Covid vaccine.  She is hesitant about the Covid vaccine because of fear of side effects and efficacy.  We discussed this extensively.  She is immunosuppressed because of prednisone.  She is also has interstitial lung disease and obesity and age over 56.   07/23/2020- APP visit Patient presents today for surgical clearance. She is planning cystoscopy/ureteroscopy and stent exchange on Monday November 8th with Dr. Vanna [facility]. She has no acute respiratory complaints. Her breathing has stayed the same. She goes most days at home without needing to use oxygen. She may need to use 2L 1-2 times a week for a couple of hours. She monitors her levels and she rarely see O2 below 88% RA.  She has not been vaccinated for covid-19, unsure if she will get it. Not currently smoking, quit in 2012. Her weight remains  stable since September. She has some lower back and abdominal pain which she takes hydrocodone at bedtime. She is following with ID and urology. Currenty on Levquin which she will complete 2 days after surgery. Denies acute symptoms of cough, chest tightness, wheezing. Afebrile.      OV 09/05/2020   Subjective:  Patient ID: Anne Rush, female , DOB: Jun 27, 1961, age 14 y.o. years. , MRN: [id number],  ADDRESS: [address] [place] [address] PCP  [name], MD Providers : Treatment Team:  Attending Provider: Kalman Shan, MD Patient Care Team: [name], MD as PCP - General (Family Medicine) Regan Lemming, MD as PCP - Electrophysiology (Cardiology) O'Neal, Ronnald Ramp, MD as PCP - Cardiology (Cardiology)  Type of visit: Telephone/Video Circumstance: COVID-19 national emergency Identification of patient Anne Rush with 10/27/1960 and MRN [id number] - 2 person identifier Risks: Risks, benefits, limitations of telephone visit explained. Patient understood and verbalized agreement to proceed Anyone else on call: none Patient location: her home This provider location: [address] in [place]  FU ILD.   #Baseline interstitial lung disease in 2012 with progression in July 2021.  High-resolution CT chest pattern with alternative diagnosis suggestive of NSIP but significant mold exposure at workplace leading up to July 2021 although no air-trapping.  Started empiric prednisone mid July 2021.  Reluctant to have surgical lung biopsy or transbronchial biopsy of bronchoscopy lavage   HPI Anne Rush 62 y.o. -and this telephone visit she wanted to discuss about coming off prednisone.  Since I last saw her she ended up with bacterial sepsis.  She saw a nurse practitioner for urologic clearance.  She has a ureteral stent.  She is feeling fine now.  She does have some fatigue and myalgias post sepsis and post urologic procedure but otherwise is feeling good.   In terms of her shortness of breath.  She tells me that she is definitely worse than baseline compared to preadmission when I first met her.  She has dyspnea on exertion class II-3.  She uses oxygen for subjective relief but does not necessarily check her pulse ox if it goes below 88%.  She believes it goes below 88%.  She is on prednisone now 5 mg Monday Wednesday Friday.  She wants to come off this.  She is here to have Covid vaccine.  She says she is scared about getting Covid vaccine because of her immune system.  I reviewed her latest high-resolution CT chest.  Her groundglass opacities have resolved but she now has chronicity.  This means her CT scan is better than earlier this year when she was in flareup but worse than 2012 when she originally was found to have ILD.  And otherwise she has progressive phenotype of non-- IPF ILD   CT Chest data 09/01/20  Narrative & Impression  CLINICAL DATA:  Follow-up interstitial lung disease, on steroid therapy. Dyspnea with exertion with intermittent oxygen use.   EXAM: CT CHEST WITHOUT CONTRAST   TECHNIQUE: Multidetector CT imaging of the chest was performed following the standard protocol without intravenous contrast. High resolution imaging of the lungs, as well as inspiratory and expiratory imaging, was performed.   COMPARISON:  04/02/2020 chest CT angiogram.   FINDINGS: Cardiovascular: Normal heart size. No significant pericardial effusion/thickening. Two lead left subclavian pacemaker with lead tips in the right atrium and right ventricular apex. Atherosclerotic nonaneurysmal thoracic aorta. Normal caliber pulmonary arteries.   Mediastinum/Nodes: No discrete thyroid nodules. Unremarkable esophagus. No pathologically enlarged axillary, mediastinal or hilar lymph nodes, noting limited sensitivity for the detection of hilar adenopathy on this noncontrast study.   Lungs/Pleura: No pneumothorax. No pleural effusion. No  acute consolidative airspace disease or lung masses. Few scattered small solid right pulmonary nodules, largest 4 mm along the minor fissure (series 13/image 69), all stable and considered benign. No new significant pulmonary nodules. No significant lobular air trapping or evidence of tracheobronchomalacia on the expiration sequence. Mild centrilobular and paraseptal emphysema. Patchy mild-to-moderate peripheral ground-glass opacity and reticulation throughout both lungs with associated minimal architectural distortion. No significant regions traction bronchiectasis. No frank honeycombing. These findings (particularly the previously described peripheral reticulation and perilobular lines) appear mildly improved since 04/03/2020 chest CT. The ground-glass opacities are decreased and the reticulation and architectural distortion are increased since remote 2012 chest CT. Slight upper lung predominance to these findings.   Upper abdomen: Coarse posterior left liver calcification, unchanged.   Musculoskeletal: No aggressive appearing focal osseous lesions. Moderate thoracic spondylosis.   IMPRESSION: 1. Spectrum of findings compatible with fibrotic interstitial lung disease  without frank honeycombing, with a slight upper lung predominance. Findings have progressed since remote 2012 chest CT, and appear mildly improved in the interval since 04/03/2020 chest CT. Differential considerations include chronic hypersensitivity pneumonitis or fibrotic NSIP/postinflammatory fibrosis. Findings are suggestive of an alternative diagnosis (not UIP) per consensus guidelines: Diagnosis of Idiopathic Pulmonary Fibrosis: An Official ATS/ERS/JRS/ALAT Clinical Practice Guideline. Am Rosezetta Schlatter Crit Care Med Vol 198, Iss 5, [phone], May 21 2017. 2. Aortic Atherosclerosis (ICD10-I70.0) and Emphysema (ICD10-J43.9).     Electronically Signed   By: Delbert Phenix M.D.   On: 09/01/2020 11:04    OV  01/01/2021  Subjective:  Patient ID: Anne Rush, female , DOB: 16-Jun-1961 , age 58 y.o. , MRN: [id number] , ADDRESS: [address] [place] [address] PCP [name], MD Patient Care Team: [name], MD as PCP - General (Family Medicine) Regan Lemming, MD as PCP - Electrophysiology (Cardiology) O'Neal, Ronnald Ramp, MD as PCP - Cardiology (Cardiology)  This Provider for this visit: Treatment Team:  Attending Provider: Kalman Shan, MD    01/01/2021 -   Chief Complaint  Patient presents with   Follow-up    Doing better, still winded going up stairs    FU ILD.     L;ast hrct dec 2021  HPI Saranya Wightman Marsteller 62 y.o. -returns for follow-up with her husband for her ILD.  She tells me from a ILD standpoint dyspnea standpoint she is stable.  Does not use oxygen.  She is off prednisone and she is happy about that.  However she says since her sepsis admission in October 2021 she has lot of fatigue.  She is mostly in the house.  She does not have the motivation to go out.  She also has leg diffuse arthralgia.  This is adding to her shortness of breath.  She is wondering the rationale for this.  Back in July her ESR was normal and her TSH was normal in January 2022.  Current pulmonary function test shows stability.  ILD symptom score for dyspnea shows improvement but fatigue still persists.  Walking desaturation test is stable.  All documented below she is off prednisone.     OV 12/15/22 at [facility] with DR Hardin Negus is a 62 year old former smoker (quit 2012, 15-pack-year history) who presents for follow-up of predominant interstitial lung disease and fatigue with shortness of breath.  This is a scheduled visit.  She was last seen here on 19 August 2022.  At that time we ordered PFTs due to her persistent complaint of dyspnea.  She did not get this done.  This will have to be rescheduled.  She is very challenging to manage as she will not follow  recommendations bided to her.  She has been erratic and using her oxygen at nighttime and she does have severe sleep apnea for which she will not wear or even attempt to wear CPAP and associated sleep-related hypoxia/hypoventilation.  It is recommended that she undergo a formal titration study however she continues to refuse this.  She does not meet criteria for inspire device and she declined ENT referral for evaluation. I have advised her that untreated sleep apnea will be exceedingly detrimental to her.   She did have COVID-07 September 2022.  Continues to have dyspnea on exertion and fatigue.  Notes occasional wheezing.  No cough.  She states she is compliant with oxygen at 2 L/min nocturnally.  She has had no orthopnea or paroxysmal nocturnal dyspnea.  No lower  extremity edema.  Calf tenderness.    OV 02/17/2023  Subjective:  Patient ID: Anne Rush, female , DOB: 12/24/1960 , age 59 y.o. , MRN: [id number] , ADDRESS: [address] [place] [address] PCP [name], MD Patient Care Team: [name], MD as PCP - General (Family Medicine) Regan Lemming, MD as PCP - Electrophysiology (Cardiology) O'Neal, Ronnald Ramp, MD as PCP - Cardiology (Cardiology)  This Provider for this visit: Treatment Team:  Attending Provider: Kalman Shan, MD    02/17/2023 -   Chief Complaint  Patient presents with   Follow-up    Switching providers, ILD     HPI Anne Rush 62 y.o. -presents for follow-up.  Is been a few years since I saw her.  She has been following with Dr. Sarina Ser and [facility]  She now wants to switch back.  She has sleep apnea.  Is here with her husband who is an independent historian.  She tells me that she is beginning to feel worse.  Some 6 or 12 months ago she could walk a full flight of 16 steps without stopping but only gets winded at the top.  Currently for the last 3 to 4 months she has been stopping midway  to catch her breath.  She feels overall she is declining.  [place] when we did access hypoxemia sit/stand test.  She did not desaturate.  Infectious tachycardic at baseline and this got slightly worse with exertion.  She feels is not able to get a full breath of her when she completes steps.  She did have a CT scan of the chest in spring 2023 as follow-up to her original acute ILD hypersensitive pneumonitis presentation.  This showed improvement.  She had pulmonary function test with [place].  21st 2024.  This also shows further continued improvement.  Therefore she is surprised.  She does have some atypical chest pains.  She got treated for maxillary sinusitis to the end of April 2024 by primary care.  Then on Jan 27, 2023 she was referred to ENT.  She says she was diagnosed with a maxillary sinus bone spur given prednisone which really helped her.  The current CT scan is done at least a week or 2 after she completed the prednisone.  She is known to have complete heart block and is on a pacer.  But today she is tachycardic at rest.  December 2023 twelve-lead EKG did not show evidence of pacing.   OV 08/23/2023  Subjective:  Patient ID: Anne Rush, female , DOB: 1960-10-25 , age 54 y.o. , MRN: [id number] , ADDRESS: [address] [place] [address] PCP [name], MD Patient Care Team: [name], MD as PCP - General (Family Medicine) Regan Lemming, MD as PCP - Electrophysiology (Cardiology) O'Neal, Ronnald Ramp, MD as PCP - Cardiology (Cardiology)  This Provider for this visit: Treatment Team:  Attending Provider: Kalman Shan, MD    08/23/2023 -   Chief Complaint  Patient presents with   Follow-up    Still having some SOB when going upstairs , has gotten a little better    #Baseline interstitial lung disease in 2012 with progression in July 2021.  High-resolution CT chest pattern with alternative diagnosis suggestive of NSIP but significant mold  exposure at workplace leading up to July 2021 although no air-trapping.  Started empiric prednisone mid July 2021.  Reluctant to have surgical lung biopsy or transbronchial biopsy of bronchoscopy lavage. Off  prednisone end Jan 2022  #History of complete heart block status post [facility] pacemaker June 2017.  Follows with Dr. Elberta Fortis.  Last EKG 2023 without any pacing  #Severe sleep apnea -has followed with Dr. Sarina Ser at [facility].  Does not use CPAP or BiPAP or inspire.  #Acute right-sided maxillary sinusitis treated with Augmentin 01/17/2023.  By Karleen Hampshire Copeland.->  Refer to ENT 01/27/2023.  HPI Adisynne Staggs Donati 62 y.o. -returns for follow-up.  Presents with her husband.  Since her last visit she says she is continues to do stable.  She did see Dr. Elberta Fortis for her coronary artery calcification electrical issues.  After her last visit she did have an echocardiogram 03/17/2023.  Results reviewed ejection fraction with no normal.  She also had a high-resolution CT chest that shows stable ILD since early 2023.  This visit she had pulmonary function test that also shows continued stability in the ILD.  Her symptom score is stable her exercise hypoxemia test is also stable [see below].  She has not had a flu shot and she will have a flu shot today.  Most recent chemistry panel was March 2024 with a normal creatinine and a normal hemoglobin.      SYMPTOM SCALE - ILD 04/04/2020   06/06/2020  01/01/2021  02/17/2023  08/23/2023   O2 use RA - 87% in hospital Uses o2 at home but below walk in on RA RA at home. Does not use 02 ra   Shortness of Breath 0 -> 5 scale with 5 being worst (score 6 If unable to do)      At rest 2 - 0 1 0  Simple tasks - showers, clothes change, eating, shaving 2 3 0 1 0  Household (dishes, doing bed, laundry) 5 3 3 2  0  Shopping 5 x x 3 2  Walking level at own pace 4 2 0 2 0  Walking up Stairs 5 3.5 5 4.5 4  Total (30-36) Dyspnea Score 23 11.5 8 13.5 6  How bad is your  cough? x 1 1 0 2  How bad is your fatigue x 4 4 3 3   How bad is nausea x 0 0 1 0  How bad is vomiting?   x 0 0 0 0  How bad is diarrhea? x 2 0 0 0  How bad is anxiety? x 3 4 4.5 5  How bad is depression x 1 0 0 0      Simple office walk 185 feet x  3 laps goal with forehead probe 06/06/2020  01/01/2021  02/17/2023  08/23/2023   O2 used ra ra ra ra  Number laps completed 3 3 Sit stand x 15 Sit stand x 15  Comments about pace normal avg good   Resting Pulse Ox/HR 98% and 101/min 99% and 95/min 98% and ?HR 96 98% with a heart rate of 71  Final Pulse Ox/HR 95% and 118/min 97% and 100/min 97% and HRR 115 96% with a heart rate of 97  Desaturated </= 88% x x    Desaturated <= 3% points c=x x    Got Tachycardic >/= 90/min x yes    Symptoms at end of test x Some dyspnea on last lap Mild 2 of 10 dyspna Level 3 out of 10 dyspnea  Miscellaneous comments x x         PFT     Latest Ref Rng & Units 07/14/2023    9:32 AM 02/07/2023    3:07  PM 09/01/2020   11:15 AM 04/04/2020   12:46 PM  ILD indicators  FVC-Pre L 2.71  2.76   2.25   FVC-Predicted Pre % 85  86  78  69   FVC-Post L  2.62     FVC-Predicted Post %  82     TLC L  4.49     TLC Predicted %  91     DLCO uncorrected ml/min/mmHg 15.38  20.27  15.24  14.04   DLCO UNC %Pred % 78  103  77  71   DLCO Corrected ml/min/mmHg  20.27   13.23   DLCO COR %Pred %  103   66      HRCT 03/02/23   Narrative & Impression  CLINICAL DATA:  62 year old Caucasian female with history of shortness of breath. Lower chest wall pain.   EXAM: CT CHEST WITHOUT CONTRAST   TECHNIQUE: Multidetector CT imaging of the chest was performed following the standard protocol without intravenous contrast. High resolution imaging of the lungs, as well as inspiratory and expiratory imaging, was performed.   RADIATION DOSE REDUCTION: This exam was performed according to the departmental dose-optimization program which includes automated exposure control,  adjustment of the mA and/or kV according to patient size and/or use of iterative reconstruction technique.   COMPARISON:  Multiple priors, most recently high-resolution chest CT 11/19/2021.   FINDINGS: Cardiovascular: Heart size is normal. There is no significant pericardial fluid, thickening or pericardial calcification. There is aortic atherosclerosis, as well as atherosclerosis of the great vessels of the mediastinum and the coronary arteries, including calcified atherosclerotic plaque in the left main coronary artery. Left-sided pacemaker/AICD device with lead tips terminating in the right atrium and right ventricular apex.   Mediastinum/Nodes: No pathologically enlarged mediastinal or hilar lymph nodes. Please note that accurate exclusion of hilar adenopathy is limited on noncontrast CT scans. Esophagus is unremarkable in appearance. No axillary lymphadenopathy.   Lungs/Pleura: Mild diffuse bronchial wall thickening with mild centrilobular and paraseptal emphysema, most evident in the upper lungs. Patchy areas of very mild peripheral predominant ground-glass attenuation, septal thickening and regional architectural distortion are noted, including some thick-walled cystic spaces in the upper lungs bilaterally, which appear likely to reflect fibrosis associated with underlying emphysematous regions (rather than true areas of honeycombing). No traction bronchiectasis. Relative sparing of the lung bases. Inspiratory and expiratory imaging demonstrates minimal air trapping indicative of very mild small airways disease. No acute consolidative airspace disease. No pleural effusions. No definite suspicious appearing pulmonary nodules or masses are noted.   Upper Abdomen: Aortic atherosclerosis.   Musculoskeletal: There are no aggressive appearing lytic or blastic lesions noted in the visualized portions of the skeleton.   IMPRESSION: 1. The appearance of the lungs remains most  compatible with an alternative diagnosis (not usual interstitial pneumonia) per current ATS guidelines. No progression of disease compared to the prior study. 2. Mild centrilobular and paraseptal emphysema; imaging findings suggestive of underlying COPD. 3. Aortic atherosclerosis, in addition to left main coronary artery disease. Please note that although the presence of coronary artery calcium documents the presence of coronary artery disease, the severity of this disease and any potential stenosis cannot be assessed on this non-gated CT examination. Assessment for potential risk factor modification, dietary therapy or pharmacologic therapy may be warranted, if clinically indicated.   Aortic Atherosclerosis (ICD10-I70.0) and Emphysema (ICD10-J43.9).     Electronically Signed   By: Trudie Reed M.D.   On: 03/08/2023 15:19  LAB RESULTS last 96 hours No results found.  LAB RESULTS last 90 days Recent Results (from the past 2160 hour(s))  CUP PACEART REMOTE DEVICE CHECK     Status: None   Collection Time: 06/09/23  3:08 AM  Result Value Ref Range   Date Time Interrogation Session [id number]    Pulse Generator Manufacturer [employer]    Pulse Gen Model 2272 Assurity MRI    Pulse Gen Serial Number [id number]    Clinic Name [facility]    Implantable Pulse Generator Type Implantable Pulse Generator    Implantable Pulse Generator Implant Date [id number]    Implantable Lead Manufacturer [facility]    Implantable Lead Model LPA1200M Tendril MRI    Implantable Lead Serial Number [id number]    Implantable Lead Implant Date [id number]    Implantable Lead Location Detail 1 UNKNOWN    Implantable Lead Location [id number]    Implantable Lead Connection Status [id number]    Implantable Lead Manufacturer [facility]    Implantable Lead Model [id number] Tendril MRI    Implantable Lead Serial Number [id number]    Implantable Lead Implant Date [id number]    Implantable Lead Location Detail 1 UNKNOWN     Implantable Lead Location [id number]    Implantable Lead Connection Status [id number]    Lead Channel Setting Sensing Sensitivity 2.5 mV   Lead Channel Setting Sensing Adaptation Mode Fixed Pacing    Lead Channel Setting Pacing Amplitude 2.0 V   Lead Channel Setting Pacing Pulse Width 0.4 ms   Lead Channel Setting Pacing Amplitude 1.5 V   Lead Channel Status NULL    Lead Channel Impedance Value 450 ohm   Lead Channel Sensing Intrinsic Amplitude 4.1 mV   Lead Channel Pacing Threshold Amplitude 1.0 V   Lead Channel Pacing Threshold Pulse Width 0.4 ms   Lead Channel Status NULL    Lead Channel Impedance Value 430 ohm   Lead Channel Pacing Threshold Amplitude 1.25 V   Lead Channel Pacing Threshold Pulse Width 0.4 ms   Battery Status MOS    Battery Remaining Longevity 22 mo   Battery Remaining Percentage 18.0 %   Battery Voltage 2.86 V   Brady Statistic RA Percent Paced 2.3 %   Brady Statistic RV Percent Paced 99.0 %   Brady Statistic AP VP Percent 2.4 %   Brady Statistic AS VP Percent 98.0 %   Brady Statistic AP VS Percent 1.0 %   Brady Statistic AS VS Percent 1.0 %  Pulmonary Function Test [facility] Only     Status: None   Collection Time: 07/14/23  9:32 AM  Result Value Ref Range   FVC-Pre 2.71 L   FVC-%Pred-Pre 85 %   FEV1-Pre 2.26 L   FEV1-%Pred-Pre 91 %   FEV6-Pre 2.71 L   FEV6-%Pred-Pre 88 %   Pre FEV1/FVC ratio 84 %   FEV1FVC-%Pred-Pre 107 %   Pre FEV6/FVC Ratio 100 %   FEV6FVC-%Pred-Pre 103 %   FEF 25-75 Pre 2.55 L/sec   FEF2575-%Pred-Pre 111 %   DLCO unc 15.38 ml/min/mmHg   DLCO unc % pred 78 %   DL/VA 0.62 ml/min/mmHg/L   DL/VA % pred 90 %         has a past medical history of Chronic systolic CHF (congestive heart failure) (HCC) (07/15/2020), Complete heart block (HCC) (02/26/2016), Family history of ovarian cancer, Generalized anxiety disorder (07/15/2020), GERD (gastroesophageal reflux disease), History of kidney stones, Hyperlipidemia, Hypertension, Interstitial lung  disease (HCC), Pollen allergies, Presence of permanent cardiac pacemaker, Pulmonary fibrosis (HCC),  Squamous cell carcinoma (2014), and Subclinical hyperthyroidism (06/17/2014).   reports that she quit smoking about 12 years ago. Her smoking use included cigarettes. She started smoking about 42 years ago. She has a 15 pack-year smoking history. She has never used smokeless tobacco.  Past Surgical History:  Procedure Laterality Date   CESAREAN SECTION     COSMETIC SURGERY     skin cancer face   CYSTOSCOPY W/ URETERAL STENT PLACEMENT Left 07/15/2020   Procedure: CYSTOSCOPY WITH RETROGRADE PYELOGRAM/URETERAL STENT PLACEMENT;  Surgeon: Riki Altes, MD;  Location: [facility] ORS;  Service: Urology;  Laterality: Left;   CYSTOSCOPY/URETEROSCOPY/HOLMIUM LASER/STENT PLACEMENT Left 07/28/2020   Procedure: CYSTOSCOPY/URETEROSCOPY/HOLMIUM LASER/STENT Exchange;  Surgeon: Vanna Scotland, MD;  Location: [facility] ORS;  Service: Urology;  Laterality: Left;   EP IMPLANTABLE DEVICE N/A 02/26/2016   Procedure: Pacemaker Implant;  Surgeon: Will Jorja Loa, MD;  Location: [facility] INVASIVE CV LAB;  Service: Cardiovascular;  Laterality: N/A;   TUBAL LIGATION      Allergies  Allergen Reactions   Codeine Hives and Itching   Sulfonamide Derivatives Hives   Paroxetine Other (See Comments)    unknown   Lisinopril Rash   Losartan Potassium Other (See Comments)    Leg felt heavy   Neomycin-Polymyxin-Dexameth Itching, Swelling and Other (See Comments)    Ey drop/Swelling and itching of eye    Oxycodone-Acetaminophen Other (See Comments)    Mild SOB, feels funny   Sulfa Antibiotics Hives and Rash         Immunization History  Administered Date(s) Administered   Influenza Nasal 06/20/2017   Influenza Whole 07/04/2008, 06/18/2009, 08/27/2010   Influenza, Seasonal, Injecte, Preservative Fre 09/03/2010, 06/22/2011, 06/27/2012   Influenza,inj,Quad PF,6+ Mos 07/21/2015, 07/06/2018, 08/10/2021   Influenza-Unspecified  06/27/2012, 07/17/2014, 07/19/2016, 07/21/2019, 07/17/2022   Td 05/10/2002   Tdap 10/28/2014   Zoster Recombinant(Shingrix) 12/08/2022, 02/08/2023    Family History  Problem Relation Age of Onset   Anxiety disorder Mother    Hypertension Mother    Ovarian cancer Mother 32   Cancer Maternal Aunt        breast   Heart failure Maternal Grandfather      Current Outpatient Medications:    acetaminophen (TYLENOL) 500 MG tablet, Take 1,000 mg by mouth every 6 (six) hours as needed. As needed, Disp: , Rfl:    albuterol (VENTOLIN HFA) 108 (90 Base) MCG/ACT inhaler, Inhale 2 puffs into the lungs every 6 (six) hours as needed for wheezing or shortness of breath., Disp: , Rfl:    carvedilol (COREG) 25 MG tablet, Take 1 tablet (25 mg total) by mouth 2 (two) times daily with a meal., Disp: 180 tablet, Rfl: 3   diazepam (VALIUM) 5 MG tablet, TAKE ONE TABLET BY MOUTH EVERY 8 HOURS FOR ANXIETY, Disp: 30 tablet, Rfl: 3   famotidine (PEPCID) 20 MG tablet, Take 1 tablet (20 mg total) by mouth daily., Disp: 30 tablet, Rfl: 0   fluticasone (FLONASE) 50 MCG/ACT nasal spray, Place 2 sprays into both nostrils daily., Disp: , Rfl:    spironolactone (ALDACTONE) 25 MG tablet, TAKE 1 TABLET BY MOUTH DAILY, Disp: 90 tablet, Rfl: 3 No current facility-administered medications for this visit.  Facility-Administered Medications Ordered in Other Visits:    albuterol (PROVENTIL) (2.5 MG/3ML) 0.083% nebulizer solution 2.5 mg, 2.5 mg, Nebulization, Once, Kalman Shan, MD      Objective:   Vitals:   08/23/23 0915  BP: 128/86  Pulse: 73  SpO2: 95%  Weight: 215 lb (97.5 kg)  Height:  5\' 3"  (1.6 m)    Estimated body mass index is 38.09 kg/m as calculated from the following:   Height as of this encounter: 5\' 3"  (1.6 m).   Weight as of this encounter: 215 lb (97.5 kg).  @WEIGHTCHANGE @  [employer]   08/23/23 0915  Weight: 215 lb (97.5 kg)     Physical Exam   General: No distress. Looks well O2  at rest: no Cane present: no Sitting in wheel chair: no Frail: no Obese: no Neuro: Alert and Oriented x 3. GCS 15. Speech normal Psych: Pleasant Resp:  Barrel Chest - no.  Wheeze - no, Crackles - no, No overt respiratory distress CVS: Normal heart sounds. Murmurs - no Ext: Stigmata of Connective Tissue Disease - no HEENT: Normal upper airway. PEERL +. No post nasal drip        Assessment:       ICD-10-CM   1. Interstitial lung disease (HCC)  J84.9     2. Paraseptal emphysema (HCC)  J43.8     3. Vaccine counseling  Z71.85     4. Flu vaccine need  Z23          Plan:     Patient Instructions     ICD-10-CM   1. Interstitial lung disease (HCC)  J84.9     2. Paraseptal emphysema (HCC)  J43.8     3. Vaccine counseling  Z71.85     4. Flu vaccine need  Z23      #Interstitial lung disease  -Clinical stable over time; based on CT scan chest 2024 pulmonary function test 2024 and symptoms  Plan - Do spirometry and DLCO in 8 months  #Emphysema  -Clinically stable  Plan - Continue Spiriva as before  #Vaccine counseling  Plan - Flu shot today   Followup  -Return to see Dr. Marchelle Gearing for a 15-minute visit in 8 months after pulmonary function testing   FOLLOWUP Return in about 8 months (around 04/22/2024) for 15 min visit, ILD, with Dr Marchelle Gearing, Face to Face Visit.    SIGNATURE    Dr. Kalman Shan, M.D., F.C.C.P,  Pulmonary and Critical Care Medicine Staff Physician, [facility] Director - Interstitial Lung Disease  Program  Pulmonary Fibrosis [facility] [place], [place], [address]  Pager: [phone], If no answer or between  15:00h - 7:00h: call [phone] Telephone: [phone]  9:44 AM 08/23/2023   Moderate Complexity MDM OFFICE  2021 E/M guidelines, first released in 2021, with minor revisions added in 2023 and 2024 Must meet the requirements for 2 out of 3 dimensions to  qualify.    Number and complexity of problems addressed Amount and/or complexity of data reviewed Risk of complications and/or morbidity  One or more chronic illness with mild exacerbation, OR progression, OR  side effects of treatment  Two or more stable chronic illnesses  One undiagnosed new problem with uncertain prognosis  One acute illness with systemic symptoms   One Acute complicated injury Must meet the requirements for 1 of 3 of the categories)  Category 1: Tests and documents, historian  Any combination of 3 of the following:  Assessment requiring an independent historian  Review of prior external note(s) from each unique source  Review of results of each unique test  Ordering of each unique test    Category 2: Interpretation of tests   Independent interpretation of a test performed by another physician/other qualified health  care professional (not separately reported)  Category 3: Discuss management/tests  Discussion of management or test interpretation with external physician/other qualified health care professional/appropriate source (not separately reported) Moderate risk of morbidity from additional diagnostic testing or treatment Examples only:  Prescription drug management  Decision regarding minor surgery with identfied patient or procedure risk factors  Decision regarding elective major surgery without identified patient or procedure risk factors  Diagnosis or treatment significantly limited by social determinants of health

## 2023-08-23 NOTE — Addendum Note (Signed)
Addended by: Winn Jock on: 08/23/2023 10:00 AM   Modules accepted: Orders

## 2023-08-23 NOTE — Patient Instructions (Addendum)
ICD-10-CM   1. Interstitial lung disease (HCC)  J84.9     2. Paraseptal emphysema (HCC)  J43.8     3. Vaccine counseling  Z71.85     4. Flu vaccine need  Z23      #Interstitial lung disease  -Clinical stable over time; based on CT scan chest 2024 pulmonary function test 2024 and symptoms  Plan - Do spirometry and DLCO in 8 months  #Emphysema  -Clinically stable  Plan - Continue Spiriva as before  #Vaccine counseling  Plan - Flu shot today   Followup  -Return to see Dr. Marchelle Gearing for a 15-minute visit in 8 months after pulmonary function testing

## 2023-09-01 DIAGNOSIS — L814 Other melanin hyperpigmentation: Secondary | ICD-10-CM | POA: Diagnosis not present

## 2023-09-01 DIAGNOSIS — L821 Other seborrheic keratosis: Secondary | ICD-10-CM | POA: Diagnosis not present

## 2023-09-01 DIAGNOSIS — D1801 Hemangioma of skin and subcutaneous tissue: Secondary | ICD-10-CM | POA: Diagnosis not present

## 2023-09-08 ENCOUNTER — Ambulatory Visit (INDEPENDENT_AMBULATORY_CARE_PROVIDER_SITE_OTHER): Payer: BC Managed Care – PPO

## 2023-09-08 DIAGNOSIS — I442 Atrioventricular block, complete: Secondary | ICD-10-CM

## 2023-09-08 LAB — CUP PACEART REMOTE DEVICE CHECK
Battery Remaining Longevity: 18 mo
Battery Remaining Percentage: 15 %
Battery Voltage: 2.84 V
Brady Statistic AP VP Percent: 1.7 %
Brady Statistic AP VS Percent: 1 %
Brady Statistic AS VP Percent: 98 %
Brady Statistic AS VS Percent: 1 %
Brady Statistic RA Percent Paced: 1.7 %
Brady Statistic RV Percent Paced: 99 %
Date Time Interrogation Session: 20241219020012
Implantable Lead Connection Status: 753985
Implantable Lead Connection Status: 753985
Implantable Lead Implant Date: 20170608
Implantable Lead Implant Date: 20170608
Implantable Lead Location: 753859
Implantable Lead Location: 753860
Implantable Pulse Generator Implant Date: 20170608
Lead Channel Impedance Value: 430 Ohm
Lead Channel Impedance Value: 450 Ohm
Lead Channel Pacing Threshold Amplitude: 1 V
Lead Channel Pacing Threshold Amplitude: 1.25 V
Lead Channel Pacing Threshold Pulse Width: 0.4 ms
Lead Channel Pacing Threshold Pulse Width: 0.4 ms
Lead Channel Sensing Intrinsic Amplitude: 4 mV
Lead Channel Setting Pacing Amplitude: 1.5 V
Lead Channel Setting Pacing Amplitude: 2 V
Lead Channel Setting Pacing Pulse Width: 0.4 ms
Lead Channel Setting Sensing Sensitivity: 2.5 mV
Pulse Gen Model: 2272
Pulse Gen Serial Number: 7903881

## 2023-09-09 DIAGNOSIS — I5189 Other ill-defined heart diseases: Secondary | ICD-10-CM | POA: Diagnosis not present

## 2023-09-09 DIAGNOSIS — J9601 Acute respiratory failure with hypoxia: Secondary | ICD-10-CM | POA: Diagnosis not present

## 2023-10-04 NOTE — Progress Notes (Signed)
   [place] Droll T. Shavaun Osterloh, MD, CAQ Sports Medicine [facility] [address] [place], [address]  Phone: [phone]  FAX: [phone]  Anne Rush - 63 y.o. female  MRN [id number]  Date of Birth: [birth year]  Date: 10/05/2023  PCP: Scherrie Curt, MD  Referral: Scherrie Curt, MD  Chief Complaint  Patient presents with   Nasal Congestion    Started on Friday-Negative Covid test last night   Generalized Body Aches   Cough   Fever   Virtual Visit via Video Note:  I connected with  Anne Rush on 10/05/2023  9:00 AM EST by a video enabled telemedicine application and verified that I am speaking with the correct person using two identifiers.   Location patient: home computer, tablet, or smartphone Location provider: work or home office Consent: Verbal consent directly obtained from [employer]. Persons participating in the virtual visit: patient, provider  I discussed the limitations of evaluation and management by telemedicine and the availability of in person appointments. The patient expressed understanding and agreed to proceed.  Chief Complaint  Patient presents with   Nasal Congestion    Started on Friday-Negative Covid test last night   Generalized Body Aches   Cough   Fever    History of Present Illness:  Anne Rush presents for an acute illness and chest congestion.  She does have a history of pulmonary fibrosis.  Bones, muscles, ears, chest hurts Cannot get up mucous well Nose is stuffy Diffuse body aches Cough  Pulse 105-124 Pulse ox 91% this morning 99.4 temp -  102.2 fever    Review of Systems as above: See pertinent positives and pertinent negatives per HPI No acute distress verbally   Observations/Objective/Exam:  An attempt was made to discern vital signs over the phone and per patient if applicable and possible.   General:    Alert, Oriented, appears well and in no acute distress  Pulmonary:      On inspection no signs of respiratory distress.  Psych / Neurological:     Pleasant and cooperative.  Assessment and Plan:    ICD-10-CM   1. Acute bronchitis due to other specified organisms  J20.8     2. Interstitial lung disease (HCC)  J84.9      Bronchitis vs Flu vs PNA Video visit limits physical exam Rec checking a home Flu test, too.  With ILD, will give steroids and ABX  I discussed the assessment and treatment plan with the patient. The patient was provided an opportunity to ask questions and all were answered. The patient agreed with the plan and demonstrated an understanding of the instructions.   The patient was advised to call back or seek an in-person evaluation if the symptoms worsen or if the condition fails to improve as anticipated.  Follow-up: prn unless noted otherwise below No follow-ups on file.  Meds ordered this encounter  Medications   doxycycline  (VIBRA -TABS) 100 MG tablet    Sig: Take 1 tablet (100 mg total) by mouth 2 (two) times daily.    Dispense:  20 tablet    Refill:  0   predniSONE  (DELTASONE ) 20 MG tablet    Sig: 2 tabs po daily for 5 days, then 1 tab po daily for 5 days    Dispense:  15 tablet    Refill:  0   No orders of the defined types were placed in this encounter.   Signed,  Ranny Bye. Kizzy Olafson, MD

## 2023-10-05 ENCOUNTER — Telehealth (INDEPENDENT_AMBULATORY_CARE_PROVIDER_SITE_OTHER): Payer: BC Managed Care – PPO | Admitting: Family Medicine

## 2023-10-05 ENCOUNTER — Encounter: Payer: Self-pay | Admitting: Family Medicine

## 2023-10-05 VITALS — BP 125/94 | HR 108 | Ht 63.0 in

## 2023-10-05 DIAGNOSIS — J849 Interstitial pulmonary disease, unspecified: Secondary | ICD-10-CM

## 2023-10-05 DIAGNOSIS — J208 Acute bronchitis due to other specified organisms: Secondary | ICD-10-CM | POA: Diagnosis not present

## 2023-10-05 MED ORDER — PREDNISONE 20 MG PO TABS: ORAL_TABLET | ORAL | 0 refills | Status: DC

## 2023-10-05 MED ORDER — DOXYCYCLINE HYCLATE 100 MG PO TABS: 100.0000 mg | ORAL_TABLET | Freq: Two times a day (BID) | ORAL | 0 refills | Status: DC

## 2023-10-09 NOTE — Progress Notes (Unsigned)
   Anne Siemen T. Delvin Hedeen, MD, CAQ Sports Medicine [facility] at [facility] [address] [place], [address]  Phone: [phone]  FAX: [phone]  Anne Rush - 63 y.o. female  MRN [id number]  Date of Birth: 04/20/1961  Date: 10/10/2023  PCP: [name], MD  Referral: [name], MD  No chief complaint on file.  Subjective:   Anne Rush is a 63 y.o. very pleasant female patient with There is no height or weight on file to calculate BMI. who presents with the following:  Patient is here for follow-up after a recent video visit on October 05, 2023, at that point she did do a home COVID test that was negative.  At that time, recommended that she also get a at home influenza test, given this virtual visit.  Given her pulmonary fibrosis, I started her on steroids and antibiotics.    Review of Systems is noted in the HPI, as appropriate  Objective:   There were no vitals taken for this visit.  GEN: No acute distress; alert,appropriate. PULM: Breathing comfortably in no respiratory distress PSYCH: Normally interactive.   Laboratory and Imaging Data:  Assessment and Plan:   ***

## 2023-10-10 ENCOUNTER — Ambulatory Visit
Admission: RE | Admit: 2023-10-10 | Discharge: 2023-10-10 | Disposition: A | Discharge status: Home / Self Care | Payer: BC Managed Care – PPO | Source: Ambulatory Visit | Attending: Family Medicine | Admitting: Family Medicine

## 2023-10-10 ENCOUNTER — Ambulatory Visit (INDEPENDENT_AMBULATORY_CARE_PROVIDER_SITE_OTHER): Payer: BC Managed Care – PPO | Admitting: Family Medicine

## 2023-10-10 VITALS — BP 124/80 | HR 76 | Temp 97.8°F | Ht 63.0 in | Wt 215.0 lb

## 2023-10-10 DIAGNOSIS — J9601 Acute respiratory failure with hypoxia: Secondary | ICD-10-CM | POA: Diagnosis not present

## 2023-10-10 DIAGNOSIS — J208 Acute bronchitis due to other specified organisms: Secondary | ICD-10-CM

## 2023-10-10 DIAGNOSIS — J849 Interstitial pulmonary disease, unspecified: Secondary | ICD-10-CM | POA: Diagnosis not present

## 2023-10-10 DIAGNOSIS — I5189 Other ill-defined heart diseases: Secondary | ICD-10-CM | POA: Diagnosis not present

## 2023-10-10 DIAGNOSIS — J479 Bronchiectasis, uncomplicated: Secondary | ICD-10-CM | POA: Diagnosis not present

## 2023-10-10 DIAGNOSIS — Z0389 Encounter for observation for other suspected diseases and conditions ruled out: Secondary | ICD-10-CM | POA: Diagnosis not present

## 2023-10-10 MED ORDER — PREDNISONE 20 MG PO TABS: ORAL_TABLET | ORAL | 0 refills | Status: DC

## 2023-10-10 MED ORDER — LEVOFLOXACIN 500 MG PO TABS: 500.0000 mg | ORAL_TABLET | Freq: Every day | ORAL | 0 refills | Status: AC

## 2023-10-10 NOTE — Patient Instructions (Signed)
Stop your doxycycline  Start one tablet of Levaquin each day - start tonight  Prednisone - increase to 2 tablets a day for the next 5 days, then in drop to 1 tablet a day for 5 days

## 2023-10-11 ENCOUNTER — Encounter: Payer: Self-pay | Admitting: Family Medicine

## 2023-10-13 ENCOUNTER — Encounter: Payer: Self-pay | Admitting: Internal Medicine

## 2023-10-13 NOTE — Progress Notes (Signed)
Remote pacemaker transmission.   

## 2023-10-18 DIAGNOSIS — J849 Interstitial pulmonary disease, unspecified: Secondary | ICD-10-CM

## 2023-10-18 DIAGNOSIS — J208 Acute bronchitis due to other specified organisms: Secondary | ICD-10-CM

## 2023-10-18 DIAGNOSIS — R051 Acute cough: Secondary | ICD-10-CM

## 2023-10-18 MED ORDER — BENZONATATE 100 MG PO CAPS: 200.0000 mg | ORAL_CAPSULE | Freq: Three times a day (TID) | ORAL | 0 refills | Status: DC | PRN

## 2023-10-18 NOTE — Telephone Encounter (Signed)
X-rays highly insensitive to make any comments about bronchiectasis etc.  Chest CT scan in summer 2024 showed ILD [ILD in itself has some bronchiectasis].  I think the current cough is postviral reactive cough.  These things can persist despite the best treatment for several weeks.  Sometimes nothing helps.  Sometimes a second course of prednisone like she is on right now helps.   I see no follow-up for her.  Plan - If she is interested please do prescribe Tessalon cough Perles 200 mg 3 times daily That she can try over-the-counter cough medication - Complete the prednisone taper given by the primary care -She can do a video visit with one of the apps of myself in the next week or 2 -We will set her up for spirometry and DLCO and follow-up for me ILD clinic; 15-minute visit at least first available   I only ordered the spirometry and DLCO and the Tessalon cough pills.

## 2023-10-18 NOTE — Telephone Encounter (Signed)
Dr. Marchelle Gearing, Please see patient message and advise on recommendations.  Thank you.

## 2023-10-20 DIAGNOSIS — H5213 Myopia, bilateral: Secondary | ICD-10-CM | POA: Diagnosis not present

## 2023-10-20 DIAGNOSIS — H52203 Unspecified astigmatism, bilateral: Secondary | ICD-10-CM | POA: Diagnosis not present

## 2023-10-20 DIAGNOSIS — H04123 Dry eye syndrome of bilateral lacrimal glands: Secondary | ICD-10-CM | POA: Diagnosis not present

## 2023-10-21 NOTE — Telephone Encounter (Signed)
I think that this message should have gone to Pulmonology, and I am sending it to you, since you last communicated with Misty Stanley.   Thank-you.

## 2023-10-24 ENCOUNTER — Other Ambulatory Visit: Payer: Self-pay | Admitting: *Deleted

## 2023-10-24 DIAGNOSIS — R0602 Shortness of breath: Secondary | ICD-10-CM

## 2023-10-24 NOTE — Progress Notes (Unsigned)
 Pft

## 2023-10-31 ENCOUNTER — Ambulatory Visit (INDEPENDENT_AMBULATORY_CARE_PROVIDER_SITE_OTHER): Payer: BC Managed Care – PPO | Admitting: Family Medicine

## 2023-10-31 ENCOUNTER — Encounter: Payer: Self-pay | Admitting: Family Medicine

## 2023-10-31 ENCOUNTER — Ambulatory Visit
Admission: RE | Admit: 2023-10-31 | Discharge: 2023-10-31 | Disposition: A | Discharge status: Home / Self Care | Payer: BC Managed Care – PPO | Source: Ambulatory Visit | Attending: Family Medicine | Admitting: Family Medicine

## 2023-10-31 VITALS — BP 138/80 | HR 75 | Temp 97.6°F | Ht 63.0 in | Wt 215.0 lb

## 2023-10-31 DIAGNOSIS — M5416 Radiculopathy, lumbar region: Secondary | ICD-10-CM | POA: Diagnosis not present

## 2023-10-31 DIAGNOSIS — M5116 Intervertebral disc disorders with radiculopathy, lumbar region: Secondary | ICD-10-CM | POA: Diagnosis not present

## 2023-10-31 DIAGNOSIS — M5137 Other intervertebral disc degeneration, lumbosacral region with discogenic back pain only: Secondary | ICD-10-CM | POA: Diagnosis not present

## 2023-10-31 NOTE — Progress Notes (Signed)
Anne Fulmore T. Nicola Quesnell, MD, CAQ Sports Medicine [facility] at [facility] [address] [place], [address]  Phone: [phone]  FAX: [phone]  Anne Rush - 63 y.o. female  MRN [id number]  Date of Birth: 1961/08/06  Date: 10/31/2023  PCP: [name], MD  Referral: [name], MD  Chief Complaint  Patient presents with   Sciatica    Left    Subjective:   Anne Valtierra Britten is a 63 y.o. very pleasant female patient with Body mass index is 38.09 kg/m. who presents with the following:  I am seeing Rush few times recently.  At baseline, she has pulmonary fibrosis, history of complete heart block, history of heart failure.  Right now, she complains of back pain and pain into the buttocks region on the left side.  This is roughly the third week that she has been having symptoms.  She had slowly been improving and was feeling good yesterday until she had an abrupt acute onset of pain into the left buttocks and down the left leg.  At this point the radicular symptoms have improved.  Able to maneuver fairly ok Last night, cannot see well.  Thought that she saw a spider.  She moved abnormally in someway, and then she developed this acute pain.   Last night about nine.  Thinks went to open the door,  Did not fall, swirled around a lot, and she had abrupt onset of pain.  Something happened.  Shooting pain from the tail to the calf and it hurts a lot.  Could not really get off the toilet and feels unsteady with walking.  Review of Systems is noted in the HPI, as appropriate  Objective:   BP 138/80 (BP Location: Left Arm, Patient Position: Sitting, Cuff Size: Large)   Pulse 75   Temp 97.6 F (36.4 C) (Temporal)   Ht 5\' 3"  (1.6 m)   Wt 215 lb (97.5 kg)   SpO2 98%   BMI 38.09 kg/m   GEN: No acute distress; alert,appropriate. PULM: Breathing comfortably in no respiratory distress PSYCH: Normally interactive.    Range of motion at  the waist:  Flexion, extension, lateral bending and rotation: She is able to forward flex to the knees.  Extension causes pain.  She also has some pain and guarding with lateral bending and rotation.  No echymosis or edema Rises to examination table with mild difficulty Gait: She moves very slowly with a shuffling gait  Inspection/Deformity: N Paraspinus Tenderness: L3-S1, mild  B Ankle Dorsiflexion (L5,4): 5/5 B Great Toe Dorsiflexion (L5,4): 5/5 Heel Walk (L5): WNL Toe Walk (S1): WNL Rise/Squat (L4): WNL, mild pain  SENSORY B Medial Foot (L4): WNL B Dorsum (L5): WNL B Lateral (S1): WNL Light Touch: WNL Pinprick: WNL  REFLEXES Knee (L4): 2+ Ankle (S1): 2+  B SLR, seated: neg B SLR, supine: neg B FABER: tender B Reverse FABER: tender B Greater Troch: NT B Log Roll: neg B Sciatic Notch: NT   Laboratory and Imaging Data:  Assessment and Plan:     ICD-10-CM   1. Acute left lumbar radiculopathy  M54.16 DG Lumbar Spine Complete    2. Degeneration of intervertebral disc of lumbosacral region with discogenic back pain  M51.370      With the lumbar spine x-ray, she does have some mild multilevel degenerative disc disease, but there does not appear any to be any acute issues.  No fractures.  I am hopeful that she will start to recover  quickly.  She was already doing much better yesterday until she had an acute setback.  She is going to do some patient motion, and I am also going to have her do some NSAIDs and Tylenol as needed.  Social right now this is limiting her ability to walk and move around at a basic level  Orders placed today for conditions managed today: Orders Placed This Encounter  Procedures   DG Lumbar Spine Complete    Disposition: No follow-ups on file.  Dragon [employer] One speech-to-text software was used for transcription in this dictation.  Possible transcriptional errors can occur using Animal nutritionist.   Signed,  Elpidio Galea. Maybell Misenheimer, MD   Outpatient  Encounter Medications as of 10/31/2023  Medication Sig   acetaminophen (TYLENOL) 500 MG tablet Take 1,000 mg by mouth every 6 (six) hours as needed. As needed   albuterol (VENTOLIN HFA) 108 (90 Base) MCG/ACT inhaler Inhale 2 puffs into the lungs every 6 (six) hours as needed for wheezing or shortness of breath.   carvedilol (COREG) 25 MG tablet Take 1 tablet (25 mg total) by mouth 2 (two) times daily with a meal.   diazepam (VALIUM) 5 MG tablet TAKE ONE TABLET BY MOUTH EVERY 8 HOURS FOR ANXIETY   famotidine (PEPCID) 20 MG tablet Take 1 tablet (20 mg total) by mouth daily.   fluticasone (FLONASE) 50 MCG/ACT nasal spray Place 2 sprays into both nostrils daily.   spironolactone (ALDACTONE) 25 MG tablet TAKE 1 TABLET BY MOUTH DAILY   [DISCONTINUED] benzonatate (TESSALON PERLES) 100 MG capsule Take 2 capsules (200 mg total) by mouth 3 (three) times daily as needed for cough.   [DISCONTINUED] doxycycline (VIBRA-TABS) 100 MG tablet Take 1 tablet (100 mg total) by mouth 2 (two) times daily.   [DISCONTINUED] predniSONE (DELTASONE) 20 MG tablet 2 tabs po daily for 5 days, then 1 tab po daily for 5 days   Facility-Administered Encounter Medications as of 10/31/2023  Medication   albuterol (PROVENTIL) (2.5 MG/3ML) 0.083% nebulizer solution 2.5 mg

## 2023-11-01 ENCOUNTER — Encounter: Payer: Self-pay | Admitting: Family Medicine

## 2023-11-10 DIAGNOSIS — J9601 Acute respiratory failure with hypoxia: Secondary | ICD-10-CM | POA: Diagnosis not present

## 2023-11-10 DIAGNOSIS — I5189 Other ill-defined heart diseases: Secondary | ICD-10-CM | POA: Diagnosis not present

## 2023-11-27 ENCOUNTER — Ambulatory Visit
Admission: EM | Admit: 2023-11-27 | Discharge: 2023-11-27 | Disposition: A | Discharge status: Home / Self Care | Attending: Emergency Medicine | Admitting: Emergency Medicine

## 2023-11-27 ENCOUNTER — Encounter: Payer: Self-pay | Admitting: Family Medicine

## 2023-11-27 DIAGNOSIS — R1032 Left lower quadrant pain: Secondary | ICD-10-CM | POA: Diagnosis not present

## 2023-11-27 LAB — POCT URINALYSIS DIP (MANUAL ENTRY)
Bilirubin, UA: NEGATIVE
Blood, UA: NEGATIVE
Glucose, UA: NEGATIVE mg/dL
Ketones, POC UA: NEGATIVE mg/dL
Nitrite, UA: NEGATIVE
Protein Ur, POC: NEGATIVE mg/dL
Spec Grav, UA: 1.015 (ref 1.010–1.025)
Urobilinogen, UA: 0.2 U/dL
pH, UA: 6 (ref 5.0–8.0)

## 2023-11-27 NOTE — Discharge Instructions (Addendum)
Follow up with your primary care provider tomorrow.  Go to the emergency department if you have worsening symptoms.

## 2023-11-27 NOTE — ED Triage Notes (Signed)
 Patient to Urgent Care with complaints of left sided, groin pain and pressure. Denies any urgency or dysuria. Reports her temperature has been elevated above her "normal".  Symptoms started two days ago. Started to fele better this morning when she woke up but symptoms have returned.

## 2023-11-27 NOTE — ED Provider Notes (Signed)
 Anne Rush    CSN: [id number] Arrival date & time: 11/27/23  1443      History   Chief Complaint Chief Complaint  Patient presents with   Urinary Frequency    HPI Anne Rush is a 63 y.o. female.  Accompanied by her husband, patient presents with left lower abdominal pain x 2 days.  The pain is worse when she urinates.  She does not have urethral discomfort.  She denies hematuria, fever, chills, flank pain, vaginal discharge, pelvic pain, vomiting, diarrhea, constipation.  No OTC medications taken today.  Patient's medical history includes left ureteral stone, acute pyelonephritis, sepsis in 2021.  The history is provided by the patient, the spouse and medical records.    Past Medical History:  Diagnosis Date   Chronic systolic CHF (congestive heart failure) (HCC) 07/15/2020   Complete heart block (HCC) 02/26/2016   s/p Pacemaker   Family history of ovarian cancer    Generalized anxiety disorder 07/15/2020   GERD (gastroesophageal reflux disease)    History of kidney stones    Hyperlipidemia    Hypertension    Interstitial lung disease (HCC)    Pollen allergies    Presence of permanent cardiac pacemaker    Pulmonary fibrosis (HCC)    Squamous cell carcinoma 2014   skin cancer   Subclinical hyperthyroidism 06/17/2014    Patient Active Problem List   Diagnosis Date Noted   OSA (obstructive sleep apnea) 12/14/2021   Nocturnal hypoxemia 12/14/2021   GERD (gastroesophageal reflux disease) 07/15/2020   Chronic systolic CHF (congestive heart failure) (HCC) 07/15/2020   Interstitial lung disease (HCC) 04/18/2020   Diastolic dysfunction 04/02/2020   Pacemaker 10/01/2018   Vitamin D deficiency 01/11/2018   Allergic angioedema 03/17/2016   Complete heart block (HCC) s/p Pacemaker 02/26/2016   Former tobacco use 08/28/2008   Hyperlipidemia LDL goal <100 06/21/2007   GAD (generalized anxiety disorder) 06/05/2007   Essential hypertension 06/05/2007   Allergic  rhinitis 06/05/2007    Past Surgical History:  Procedure Laterality Date   CESAREAN SECTION     CYSTOSCOPY W/ URETERAL STENT PLACEMENT Left 07/15/2020   Procedure: CYSTOSCOPY WITH RETROGRADE PYELOGRAM/URETERAL STENT PLACEMENT;  Surgeon: Riki Altes, MD;  Location: [facility] ORS;  Service: Urology;  Laterality: Left;   CYSTOSCOPY/URETEROSCOPY/HOLMIUM LASER/STENT PLACEMENT Left 07/28/2020   Procedure: CYSTOSCOPY/URETEROSCOPY/HOLMIUM LASER/STENT Exchange;  Surgeon: Vanna Scotland, MD;  Location: [facility] ORS;  Service: Urology;  Laterality: Left;   EP IMPLANTABLE DEVICE N/A 02/26/2016   Procedure: Pacemaker Implant;  Surgeon: Will Jorja Loa, MD;  Location: [facility];  Service: Cardiovascular;  Laterality: N/A;   MOHS SURGERY     skin cancer face   TUBAL LIGATION      OB History     Gravida  1   Para  1   Term      Preterm  1   AB      Living  1      SAB      IAB      Ectopic      Multiple      Live Births  1            Home Medications    Prior to Admission medications   Medication Sig Start Date End Date Taking? Authorizing Provider  acetaminophen (TYLENOL) 500 MG tablet Take 1,000 mg by mouth every 6 (six) hours as needed. As needed    [provider]  albuterol (VENTOLIN HFA) 108 (90 Base) MCG/ACT  inhaler Inhale 2 puffs into the lungs every 6 (six) hours as needed for wheezing or shortness of breath.    [provider]  carvedilol (COREG) 25 MG tablet Take 1 tablet (25 mg total) by mouth 2 (two) times daily with a meal. 05/04/23   Camnitz, Will Daphine Deutscher, MD  diazepam (VALIUM) 5 MG tablet TAKE ONE TABLET BY MOUTH EVERY 8 HOURS FOR ANXIETY 07/22/23   Copland, Karleen Hampshire, MD  famotidine (PEPCID) 20 MG tablet Take 1 tablet (20 mg total) by mouth daily. 04/06/20   Lynn Ito, MD  fluticasone (FLONASE) 50 MCG/ACT nasal spray Place 2 sprays into both nostrils daily. 02/04/23   [provider]  spironolactone (ALDACTONE) 25 MG tablet  TAKE 1 TABLET BY MOUTH DAILY 02/07/23   Copland, Karleen Hampshire, MD    Family History Family History  Problem Relation Age of Onset   Anxiety disorder Mother    Hypertension Mother    Ovarian cancer Mother 50   Cancer Maternal Aunt        breast   Heart failure Maternal Grandfather     Social History Social History   Tobacco Use   Smoking status: Former    Current packs/day: 0.00    Average packs/day: 0.5 packs/day for 30.0 years (15.0 ttl pk-yrs)    Types: Cigarettes    Start date: 2    Quit date: 2012    Years since quitting: 13.1   Smokeless tobacco: Never  Vaping Use   Vaping status: Never Used  Substance Use Topics   Alcohol use: No   Drug use: No     Allergies   Codeine, Sulfonamide derivatives, Paroxetine, Lisinopril, Losartan potassium, Neomycin-polymyxin-dexameth, Oxycodone-acetaminophen, and Sulfa antibiotics   Review of Systems Review of Systems  Constitutional:  Negative for chills and fever.  Gastrointestinal:  Positive for abdominal pain. Negative for constipation, diarrhea and vomiting.  Genitourinary:  Negative for difficulty urinating, dysuria, flank pain, hematuria, pelvic pain and vaginal discharge.     Physical Exam Triage Vital Signs ED Triage Vitals  Encounter Vitals Group     BP      Systolic BP Percentile      Diastolic BP Percentile      Pulse      Resp      Temp      Temp src      SpO2      Weight      Height      Head Circumference      Peak Flow      Pain Score      Pain Loc      Pain Education      Exclude from Growth Chart    No data found.  Updated Vital Signs BP (!) 142/85   Pulse 89   Temp (!) 97.5 F (36.4 C)   Resp 18   SpO2 96%   Visual Acuity Right Eye Distance:   Left Eye Distance:   Bilateral Distance:    Right Eye Near:   Left Eye Near:    Bilateral Near:     Physical Exam Constitutional:      General: She is not in acute distress. HENT:     Mouth/Throat:     Mouth: Mucous membranes are  moist.  Cardiovascular:     Rate and Rhythm: Normal rate and regular rhythm.  Pulmonary:     Effort: Pulmonary effort is normal. No respiratory distress.  Abdominal:     General: Bowel sounds are normal.  Palpations: Abdomen is soft.     Tenderness: There is abdominal tenderness in the left lower quadrant. There is no right CVA tenderness, left CVA tenderness, guarding or rebound.     Comments: Mild tenderness to palpation of left lower abdomen.  No rebound or guarding.  Neurological:     Mental Status: She is alert.      UC Treatments / Results  Labs (all labs ordered are listed, but only abnormal results are displayed) Labs Reviewed  POCT URINALYSIS DIP (MANUAL ENTRY) - Abnormal; Notable for the following components:      Result Value   Leukocytes, UA Trace (*)    All other components within normal limits    EKG   Radiology No results found.  Procedures Procedures (including critical care time)  Medications Ordered in UC Medications - No data to display  Initial Impression / Assessment and Plan / UC Course  I have reviewed the triage vital signs and the nursing notes.  Pertinent labs & imaging results that were available during my care of the patient were reviewed by me and considered in my medical decision making (see chart for details).    LLQ abdominal pain.  Afebrile and vital signs are stable.  Patient has mild tenderness in her left lower abdomen but no rebound or guarding.  No CVAT.  Her medical history includes kidney stones, pyelonephritis, sepsis in 2021.  She declines transfer to the ED at this time.  Instructed patient to follow-up with her PCP tomorrow.  Strict ED precautions discussed at length.  Education provided on abdominal pain.  Patient and her husband agree to plan of care.  Final Clinical Impressions(s) / UC Diagnoses   Final diagnoses:  Left lower quadrant abdominal pain     Discharge Instructions      Follow up with your primary  care provider tomorrow.  Go to the emergency department if you have worsening symptoms.        ED Prescriptions   None    PDMP not reviewed this encounter.   Mickie Bail, NP 11/27/23 1525

## 2023-11-29 ENCOUNTER — Other Ambulatory Visit: Payer: Self-pay | Admitting: Family Medicine

## 2023-11-29 NOTE — Progress Notes (Addendum)
 Anne Allerton T. Anisten Tomassi, MD, CAQ Sports Medicine [facility] at [facility] [address] [place], [address]  Phone: [phone]  FAX: [phone]  Anne Rush - 63 y.o. female  MRN [id number]  Date of Birth: Jun 16, 1961  Date: 11/30/2023  PCP: [name], MD  Referral: [name], MD  Chief Complaint  Patient presents with   LLQ Pain    Seen at Urgent Care 11/27/23   Abdominal Pain   Subjective:   Anne Rush is a 63 y.o. very pleasant female patient with Body mass index is 38.26 kg/m. who presents with the following:  The patient presents with left lower abdominal pain.  She does have a significant history of pulmonary fibrosis as well as congestive heart failure.  She was actually in the emergency room on November 27, 2023 for lower abdominal pain.  She had a normal urinalysis.  LLQ abdominal pain.  Has not hurt with urination.  Pain is really severe. Some pain hypogastric and L lower as the worst. It has waxed and waned a bit over time, but it is definitely in the left lower quadrant  She has never had colonoscopy  C-S and BTL in the past.  No other intra-abdominal surgeries  BM is gassy.  No blood.  Smaller - no constipation.   No fever or chills Diminished p.o. intake No blood or black tarry coloration to stool  Review of Systems is noted in the HPI, as appropriate  Objective:   BP 110/80 (BP Location: Left Arm, Patient Position: Sitting, Cuff Size: Large)   Pulse 81   Temp 98 F (36.7 C) (Temporal)   Ht 5\' 3"  (1.6 m)   Wt 216 lb (98 kg)   SpO2 98%   BMI 38.26 kg/m   GEN: No acute distress; alert,appropriate. CV: RRR, no m/g/r  PULM: Normal respiratory rate, no accessory muscle use. No wheezes, crackles or rhonchi  PSYCH: Normally interactive.  ABD: S, she does have mild left lower quadrant and hypogastric tenderness, ND, + BS, No rebound, No HSM   Laboratory and Imaging Data:  Assessment and Plan:     ICD-10-CM    1. LLQ abdominal pain  R10.32     2. Acute diverticulitis  K57.92     3. Lower abdominal pain  R10.30 CT ABDOMEN PELVIS W CONTRAST     Most likely culprit here is diverticulitis.  I am going to have her back off on her solid food intake and progress gently with fluid and simple rice type food.  We will plan on treating this like probable diverticulitis.  If symptoms persist or worsen, we may need to do an additional workup and imaging.  Addendum: 12/23/23 2:29 PM  I just spoke to [place] on the phone.  She is having 8 out of 10 abdominal pain, she does not think she really improved at all after her Cipro and Flagyl with presumptive treatment for diverticulitis.  At this point, I do think we need to investigate this further and image her abdomen.  We will obtain a CT abdomen and pelvis with contrast to evaluate for our intra-abdominal neoplasm, bowel obstruction, acute cholecystitis, or other acute intra-abdominal process.  She also has thrush, and I am going to send her in some nystatin  Medication Management during today's office visit: Meds ordered this encounter  Medications   DISCONTD: ciprofloxacin (CIPRO) 500 MG tablet    Sig: Take 1 tablet (500 mg total) by mouth 2 (two) times daily  for 10 days.    Dispense:  20 tablet    Refill:  0   DISCONTD: metroNIDAZOLE (FLAGYL) 500 MG tablet    Sig: Take 1 tablet (500 mg total) by mouth 3 (three) times daily.    Dispense:  30 tablet    Refill:  0   nystatin (MYCOSTATIN) 100000 UNIT/ML suspension    Sig: Take 5 mLs (500,000 Units total) by mouth 4 (four) times daily. Swish and swallow    Dispense:  473 mL    Refill:  0   Medications Discontinued During This Encounter  Medication Reason   metroNIDAZOLE (FLAGYL) 500 MG tablet    ciprofloxacin (CIPRO) 500 MG tablet     Orders placed today for conditions managed today: Orders Placed This Encounter  Procedures   CT ABDOMEN PELVIS W CONTRAST    Disposition: No follow-ups on  file.  Dragon [employer] One speech-to-text software was used for transcription in this dictation.  Possible transcriptional errors can occur using Animal nutritionist.   Signed,  Elpidio Galea. Romano Stigger, MD   Outpatient Encounter Medications as of 11/30/2023  Medication Sig   acetaminophen (TYLENOL) 500 MG tablet Take 1,000 mg by mouth every 6 (six) hours as needed. As needed   albuterol (VENTOLIN HFA) 108 (90 Base) MCG/ACT inhaler Inhale 2 puffs into the lungs every 6 (six) hours as needed for wheezing or shortness of breath.   carvedilol (COREG) 25 MG tablet Take 1 tablet (25 mg total) by mouth 2 (two) times daily with a meal.   diazepam (VALIUM) 5 MG tablet TAKE ONE TABLET BY MOUTH EVERY 8 HOURS FOR ANXIETY   famotidine (PEPCID) 20 MG tablet Take 1 tablet (20 mg total) by mouth daily.   fluticasone (FLONASE) 50 MCG/ACT nasal spray Place 2 sprays into both nostrils daily.   nystatin (MYCOSTATIN) 100000 UNIT/ML suspension Take 5 mLs (500,000 Units total) by mouth 4 (four) times daily. Swish and swallow   spironolactone (ALDACTONE) 25 MG tablet TAKE 1 TABLET BY MOUTH DAILY   [DISCONTINUED] ciprofloxacin (CIPRO) 500 MG tablet Take 1 tablet (500 mg total) by mouth 2 (two) times daily for 10 days.   [DISCONTINUED] metroNIDAZOLE (FLAGYL) 500 MG tablet Take 1 tablet (500 mg total) by mouth 3 (three) times daily.   [DISCONTINUED] diazepam (VALIUM) 5 MG tablet TAKE ONE TABLET BY MOUTH EVERY 8 HOURS FOR ANXIETY   Facility-Administered Encounter Medications as of 11/30/2023  Medication   albuterol (PROVENTIL) (2.5 MG/3ML) 0.083% nebulizer solution 2.5 mg

## 2023-11-30 ENCOUNTER — Ambulatory Visit (INDEPENDENT_AMBULATORY_CARE_PROVIDER_SITE_OTHER): Admitting: Family Medicine

## 2023-11-30 ENCOUNTER — Encounter: Payer: Self-pay | Admitting: Family Medicine

## 2023-11-30 VITALS — BP 110/80 | HR 81 | Temp 98.0°F | Ht 63.0 in | Wt 216.0 lb

## 2023-11-30 DIAGNOSIS — R1032 Left lower quadrant pain: Secondary | ICD-10-CM | POA: Diagnosis not present

## 2023-11-30 DIAGNOSIS — K5792 Diverticulitis of intestine, part unspecified, without perforation or abscess without bleeding: Secondary | ICD-10-CM | POA: Diagnosis not present

## 2023-11-30 DIAGNOSIS — R1030 Lower abdominal pain, unspecified: Secondary | ICD-10-CM

## 2023-11-30 MED ORDER — METRONIDAZOLE 500 MG PO TABS: 500.0000 mg | ORAL_TABLET | Freq: Three times a day (TID) | ORAL | 0 refills | Status: DC

## 2023-11-30 MED ORDER — CIPROFLOXACIN HCL 500 MG PO TABS: 500.0000 mg | ORAL_TABLET | Freq: Two times a day (BID) | ORAL | 0 refills | Status: DC

## 2023-11-30 NOTE — Telephone Encounter (Signed)
 Last office visit 10/31/2023 for left lumbar radiculopathy.  Last refilled 07/22/2023 for #30 with 3 refills.  Next Appt: today for side pain.

## 2023-12-01 ENCOUNTER — Encounter: Payer: Self-pay | Admitting: Family Medicine

## 2023-12-08 ENCOUNTER — Ambulatory Visit (INDEPENDENT_AMBULATORY_CARE_PROVIDER_SITE_OTHER): Payer: Medicare Other

## 2023-12-08 DIAGNOSIS — I5189 Other ill-defined heart diseases: Secondary | ICD-10-CM | POA: Diagnosis not present

## 2023-12-08 DIAGNOSIS — I442 Atrioventricular block, complete: Secondary | ICD-10-CM | POA: Diagnosis not present

## 2023-12-08 DIAGNOSIS — J9601 Acute respiratory failure with hypoxia: Secondary | ICD-10-CM | POA: Diagnosis not present

## 2023-12-09 LAB — CUP PACEART REMOTE DEVICE CHECK
Battery Remaining Longevity: 14 mo
Battery Remaining Percentage: 12 %
Battery Voltage: 2.83 V
Brady Statistic AP VP Percent: 1.8 %
Brady Statistic AP VS Percent: 1 %
Brady Statistic AS VP Percent: 98 %
Brady Statistic AS VS Percent: 1 %
Brady Statistic RA Percent Paced: 1.8 %
Brady Statistic RV Percent Paced: 99 %
Date Time Interrogation Session: 20250320063332
Implantable Lead Connection Status: 753985
Implantable Lead Connection Status: 753985
Implantable Lead Implant Date: 20170608
Implantable Lead Implant Date: 20170608
Implantable Lead Location: 753859
Implantable Lead Location: 753860
Implantable Pulse Generator Implant Date: 20170608
Lead Channel Impedance Value: 440 Ohm
Lead Channel Impedance Value: 450 Ohm
Lead Channel Pacing Threshold Amplitude: 1 V
Lead Channel Pacing Threshold Amplitude: 1.375 V
Lead Channel Pacing Threshold Pulse Width: 0.4 ms
Lead Channel Pacing Threshold Pulse Width: 0.4 ms
Lead Channel Sensing Intrinsic Amplitude: 4 mV
Lead Channel Setting Pacing Amplitude: 1.625
Lead Channel Setting Pacing Amplitude: 2 V
Lead Channel Setting Pacing Pulse Width: 0.4 ms
Lead Channel Setting Sensing Sensitivity: 2.5 mV
Pulse Gen Model: 2272
Pulse Gen Serial Number: 7903881

## 2023-12-23 ENCOUNTER — Ambulatory Visit
Admission: RE | Admit: 2023-12-23 | Discharge: 2023-12-23 | Disposition: A | Discharge status: Home / Self Care | Source: Ambulatory Visit | Attending: Family Medicine | Admitting: Family Medicine

## 2023-12-23 ENCOUNTER — Telehealth: Payer: Self-pay

## 2023-12-23 DIAGNOSIS — R1030 Lower abdominal pain, unspecified: Secondary | ICD-10-CM | POA: Insufficient documentation

## 2023-12-23 DIAGNOSIS — K429 Umbilical hernia without obstruction or gangrene: Secondary | ICD-10-CM | POA: Diagnosis not present

## 2023-12-23 DIAGNOSIS — R1032 Left lower quadrant pain: Secondary | ICD-10-CM | POA: Diagnosis not present

## 2023-12-23 DIAGNOSIS — K5730 Diverticulosis of large intestine without perforation or abscess without bleeding: Secondary | ICD-10-CM | POA: Diagnosis not present

## 2023-12-23 MED ORDER — NYSTATIN 100000 UNIT/ML MT SUSP: 5.0000 mL | Freq: Four times a day (QID) | OROMUCOSAL | 0 refills | Status: DC

## 2023-12-23 MED ORDER — IOHEXOL 300 MG/ML  SOLN
100.0000 mL | Freq: Once | INTRAMUSCULAR | Status: AC | PRN
  Administered 2023-12-23: 100 mL via INTRAVENOUS

## 2023-12-23 NOTE — Telephone Encounter (Signed)
 Called pt following CRM listed below.  Pt reports that her symptoms never resolved since she was in. She is concerned about the abdominal pain that comes and goes. At its worst she rates it as 8/10.  The location varies between right and left lower abdomin, walking increases the pain.  I asked pt about bowel movements and she described those as "normal." She completed her medications and feels like she has thrush, reports white/yellow on her tongue.  She has started taking Kefer daily and it has improved. Writer made pt an appointment for Monday 12/26/23 at 9:40pm.  Pt given ED precautions about increased constant abdominal pain, blood in stool.  Encouraged her to not wait until Monday if symptoms worsen over the weekend.  Pt verbalized understanding.    Copied from CRM [id number]. Topic: Clinical - Medical Advice >> Dec 23, 2023  8:45 AM Elizebeth Brooking wrote: Reason for CRM: Patient called in stating she is still having issues, Stated Dr.Copland stated if she was having anymore problems to call in, so she is not sure if she should make a appointment. Would like for Lupita Leash to call her back on what she should do.

## 2023-12-23 NOTE — Telephone Encounter (Signed)
 Appointment on 12/26/2023 cancelled as instructed by Dr. Patsy Lager.

## 2023-12-23 NOTE — Addendum Note (Signed)
 Addended by: [name] on: 12/23/2023 02:30 PM   Modules accepted: Orders

## 2023-12-23 NOTE — Telephone Encounter (Signed)
 I spoke to [place] myself.  I am going to obtain a CT abdomen pelvis with contrast.  We will also treat for thrush.  Could you please help cancel her appointment on Monday, and I think follow-up can be determined after CT abdomen pelvis is returned?

## 2023-12-25 ENCOUNTER — Encounter: Payer: Self-pay | Admitting: Family Medicine

## 2023-12-26 ENCOUNTER — Encounter: Payer: Self-pay | Admitting: Family Medicine

## 2023-12-26 ENCOUNTER — Ambulatory Visit (INDEPENDENT_AMBULATORY_CARE_PROVIDER_SITE_OTHER): Admitting: Family Medicine

## 2023-12-26 VITALS — BP 118/82 | HR 82 | Temp 97.3°F | Ht 63.0 in | Wt 213.1 lb

## 2023-12-26 DIAGNOSIS — R1030 Lower abdominal pain, unspecified: Secondary | ICD-10-CM

## 2023-12-26 DIAGNOSIS — R102 Pelvic and perineal pain: Secondary | ICD-10-CM | POA: Diagnosis not present

## 2023-12-26 LAB — BASIC METABOLIC PANEL WITH GFR
BUN: 13 mg/dL (ref 6–23)
CO2: 31 meq/L (ref 19–32)
Calcium: 10.1 mg/dL (ref 8.4–10.5)
Chloride: 99 meq/L (ref 96–112)
Creatinine, Ser: 0.63 mg/dL (ref 0.40–1.20)
GFR: 96 mL/min (ref >60.00)
Glucose, Bld: 106 mg/dL — ABNORMAL HIGH (ref 70–99)
Potassium: 4.5 meq/L (ref 3.5–5.1)
Sodium: 138 meq/L (ref 135–145)

## 2023-12-26 LAB — CBC WITH DIFFERENTIAL/PLATELET
Basophils Absolute: 0 10*3/uL (ref 0.0–0.1)
Basophils Relative: 0.9 % (ref 0.0–3.0)
Eosinophils Absolute: 0.1 10*3/uL (ref 0.0–0.7)
Eosinophils Relative: 2.6 % (ref 0.0–5.0)
HCT: 41.8 % (ref 36.0–46.0)
Hemoglobin: 14.6 g/dL (ref 12.0–15.0)
Lymphocytes Relative: 37.7 % (ref 12.0–46.0)
Lymphs Abs: 1.6 10*3/uL (ref 0.7–4.0)
MCHC: 34.8 g/dL (ref 30.0–36.0)
MCV: 92.5 fl (ref 78.0–100.0)
Monocytes Absolute: 0.4 10*3/uL (ref 0.1–1.0)
Monocytes Relative: 9.5 % (ref 3.0–12.0)
Neutro Abs: 2.1 10*3/uL (ref 1.4–7.7)
Neutrophils Relative %: 49.3 % (ref 43.0–77.0)
Platelets: 204 10*3/uL (ref 150.0–400.0)
RBC: 4.52 Mil/uL (ref 3.87–5.11)
RDW: 12.4 % (ref 11.5–15.5)
WBC: 4.3 10*3/uL (ref 4.0–10.5)

## 2023-12-26 LAB — URINALYSIS, ROUTINE W REFLEX MICROSCOPIC
Bilirubin Urine: NEGATIVE
Hgb urine dipstick: NEGATIVE
Ketones, ur: NEGATIVE
Nitrite: NEGATIVE
RBC / HPF: NONE SEEN (ref 0–?)
Specific Gravity, Urine: 1.01 (ref 1.000–1.030)
Total Protein, Urine: NEGATIVE
Urine Glucose: NEGATIVE
Urobilinogen, UA: 0.2 (ref 0.0–1.0)
pH: 7.5 (ref 5.0–8.0)

## 2023-12-26 LAB — LIPASE: Lipase: 18 U/L (ref 11.0–59.0)

## 2023-12-26 LAB — HEPATIC FUNCTION PANEL
ALT: 21 U/L (ref 0–35)
AST: 22 U/L (ref 0–37)
Albumin: 4.9 g/dL (ref 3.5–5.2)
Alkaline Phosphatase: 52 U/L (ref 39–117)
Bilirubin, Direct: 0.1 mg/dL (ref 0.0–0.3)
Total Bilirubin: 0.8 mg/dL (ref 0.2–1.2)
Total Protein: 7.8 g/dL (ref 6.0–8.3)

## 2023-12-26 NOTE — Progress Notes (Signed)
 Ziquan Fidel T. Briselda Naval, MD, CAQ Sports Medicine [facility] [address] [place], [address]  Phone: [phone]  FAX: [phone]  Anne Rush - 63 y.o. female  MRN [id number]  Date of Birth: 1960-11-09  Date: 12/26/2023  PCP: [name], MD  Referral: [name], MD  Chief Complaint  Patient presents with   Abdominal Pain    Follow up CT Scan   Subjective:   Anne Rush is a 63 y.o. very pleasant female patient with Body mass index is 37.75 kg/m. who presents with the following:  She has a patient who I have known very well for many years.  I saw her on November 30, 2023 with some left lower quadrant abdominal pain.  At that point, thought that she most likely had diverticulitis.  At that point, I placed her on Cipro and Flagyl.  She subsequently contacted me several weeks later, and she felt as if she was not having any improvement at all.  At that point I did do a CT abdomen pelvis which was essentially unremarkable for any upper abdominal process.  She continues to have pain in the lowerQuadrants in the hypogastric region.  She feels as if this has not really improved at all since I initially saw her.  She does think that Tylenol helps somewhat with pain.  She has some increased fatigue and does not particularly want to eat very much right now.   Pain has never stopped - down in the hypogastric area,  sOME PAIN IN THE ANTERIOR GROIN cAN TAKE A TYLENOL AND IT WILL HELP. Mor efatiged and does not want to eat much Thursday ten minutes into it felt really bad.  Kefir has seemed to help her stools When she wipes, no blood  Fell in February  She has never had colonoscopy.  Cologuard was negative the last time it was checked. Declines any colonoscopy indefinitely  CT ABDOMEN PELVIS W CONTRAST Result Date: 12/23/2023 CLINICAL DATA:  Left lower quadrant pain. EXAM: CT ABDOMEN AND PELVIS WITH CONTRAST TECHNIQUE:  Multidetector CT imaging of the abdomen and pelvis was performed using the standard protocol following bolus administration of intravenous contrast. RADIATION DOSE REDUCTION: This exam was performed according to the departmental dose-optimization program which includes automated exposure control, adjustment of the mA and/or kV according to patient size and/or use of iterative reconstruction technique. CONTRAST:  OMNIPAQUE IOHEXOL 300 MG/ML  SOLN COMPARISON:  CT abdomen and pelvis 07/13/2020 FINDINGS: Lower chest: No acute abnormality. Hepatobiliary: No focal liver abnormality is seen. No gallstones, gallbladder wall thickening, or biliary dilatation. Pancreas: Unremarkable. No pancreatic ductal dilatation or surrounding inflammatory changes. Spleen: Normal in size without focal abnormality. Adrenals/Urinary Tract: Adrenal glands are unremarkable. Kidneys are normal, without renal calculi, focal lesion, or hydronephrosis. Bladder is unremarkable. Stomach/Bowel: Stomach is within normal limits. Appendix appears normal. No evidence of bowel wall thickening, distention, or inflammatory changes. There is sigmoid colon diverticulosis. Vascular/Lymphatic: Likely calcified lymph node near the left lobe of the liver is unchanged. No new enlarged lymph nodes are identified. Aorta and IVC are normal in size. There are atherosclerotic calcifications of the aorta Reproductive: Uterus and bilateral adnexa are unremarkable. Other: There is a small fat containing umbilical hernia. There is no ascites. Musculoskeletal: No acute or significant osseous findings. IMPRESSION: 1. No acute localizing process in the abdomen or pelvis. 2. Sigmoid colon diverticulosis. 3.  Aortic Atherosclerosis (ICD10-I70.0). Electronically Signed   By: Darliss Cheney  M.D.   On: 12/23/2023 16:32    Health Maintenance  Topic Date Due   Medicare Annual Wellness (AWV)  Never done   COVID-19 Vaccine (1) Never done   Pneumococcal Vaccine 19-64 Years  old (1 of 2 - PCV) Never done   MAMMOGRAM  04/28/2016   Cervical Cancer Screening (HPV/Pap Cotest)  09/25/2021   Fecal DNA (Cologuard)  11/04/2023   INFLUENZA VACCINE  04/20/2024   DTaP/Tdap/Td (3 - Td or Tdap) 10/28/2024   Hepatitis C Screening  Completed   HIV Screening  Completed   Zoster Vaccines- Shingrix  Completed   HPV VACCINES  Aged Out     Review of Systems is noted in the HPI, as appropriate  Objective:   BP 118/82 (BP Location: Right Arm, Patient Position: Sitting, Cuff Size: Normal)   Pulse 82   Temp (!) 97.3 F (36.3 C) (Temporal)   Ht 5\' 3"  (1.6 m)   Wt 213 lb 2 oz (96.7 kg)   SpO2 92%   BMI 37.75 kg/m   GEN: No acute distress; alert,appropriate. PSYCH: Normally interactive.  CV: RRR, no m/g/r  PULM: Normal respiratory rate, no accessory muscle use. No wheezes, crackles or rhonchi  ABD: S, she has some mild tenderness in the hypogastric region in addition to the left lower quadrant and right lower quadrant, ND, + BS, No rebound, No HSM   Laboratory and Imaging Data:  Assessment and Plan:     ICD-10-CM   1. Lower abdominal pain  R10.30 US Pelvic Complete With Transvaginal    Lipase    Basic metabolic panel with GFR    CBC with Differential/Platelet    Hepatic function panel    Urinalysis, Routine w reflex microscopic    Urine Culture    2. Acute pelvic pain, female  R10.2 US Pelvic Complete With Transvaginal    Lipase    Basic metabolic panel with GFR    CBC with Differential/Platelet    Hepatic function panel    Urinalysis, Routine w reflex microscopic    Urine Culture     Abdominal and pelvic pain without any clear diagnosis.  CT abdomen and pelvis was reassuring without any intra-abdominal process.  At this point her predominant pain is in the hypogastric region as well as the left and right lower quadrants.  We will obtain a pelvic ultrasound to evaluate for lower quadrant abnormalities, ovarian cyst, ovarian mass, or other gynecological  issues that may relate to pain.  Will also check basic labs as below.  If the entirety of this workup additionally is negative, then I would recommend gastroenterology evaluation.  Medication Management during today's office visit: No orders of the defined types were placed in this encounter.  There are no discontinued medications.  Orders placed today for conditions managed today: Orders Placed This Encounter  Procedures   Urine Culture   US Pelvic Complete With Transvaginal   Lipase   Basic metabolic panel with GFR   CBC with Differential/Platelet   Hepatic function panel   Urinalysis, Routine w reflex microscopic    Disposition: No follow-ups on file.  [employer] speech-to-text software was used for transcription in this dictation.  Possible transcriptional errors can occur using Animal nutritionist.   Signed,  Elpidio Galea. Burnell Hurta, MD   Outpatient Encounter Medications as of 12/26/2023  Medication Sig   acetaminophen (TYLENOL) 500 MG tablet Take 1,000 mg by mouth every 6 (six) hours as needed. As needed   albuterol (VENTOLIN HFA) 108 (90  Base) MCG/ACT inhaler Inhale 2 puffs into the lungs every 6 (six) hours as needed for wheezing or shortness of breath.   carvedilol (COREG) 25 MG tablet Take 1 tablet (25 mg total) by mouth 2 (two) times daily with a meal.   diazepam (VALIUM) 5 MG tablet TAKE ONE TABLET BY MOUTH EVERY 8 HOURS FOR ANXIETY   famotidine (PEPCID) 20 MG tablet Take 1 tablet (20 mg total) by mouth daily.   fluticasone (FLONASE) 50 MCG/ACT nasal spray Place 2 sprays into both nostrils daily.   nystatin (MYCOSTATIN) 100000 UNIT/ML suspension Take 5 mLs (500,000 Units total) by mouth 4 (four) times daily. Swish and swallow   spironolactone (ALDACTONE) 25 MG tablet TAKE 1 TABLET BY MOUTH DAILY   Facility-Administered Encounter Medications as of 12/26/2023  Medication   albuterol (PROVENTIL) (2.5 MG/3ML) 0.083% nebulizer solution 2.5 mg

## 2023-12-27 LAB — URINE CULTURE
MICRO NUMBER:: 16297272
SPECIMEN QUALITY:: ADEQUATE

## 2023-12-28 ENCOUNTER — Ambulatory Visit: Admitting: Family Medicine

## 2024-01-04 ENCOUNTER — Ambulatory Visit
Admission: RE | Admit: 2024-01-04 | Discharge: 2024-01-04 | Disposition: A | Discharge status: Home / Self Care | Source: Ambulatory Visit | Attending: Family Medicine | Admitting: Family Medicine

## 2024-01-04 DIAGNOSIS — R102 Pelvic and perineal pain: Secondary | ICD-10-CM | POA: Diagnosis not present

## 2024-01-04 DIAGNOSIS — R1030 Lower abdominal pain, unspecified: Secondary | ICD-10-CM | POA: Insufficient documentation

## 2024-01-08 DIAGNOSIS — I5189 Other ill-defined heart diseases: Secondary | ICD-10-CM | POA: Diagnosis not present

## 2024-01-08 DIAGNOSIS — J9601 Acute respiratory failure with hypoxia: Secondary | ICD-10-CM | POA: Diagnosis not present

## 2024-01-10 ENCOUNTER — Encounter: Payer: Self-pay | Admitting: Family Medicine

## 2024-01-11 ENCOUNTER — Encounter: Payer: Self-pay | Admitting: Family Medicine

## 2024-01-11 DIAGNOSIS — R1030 Lower abdominal pain, unspecified: Secondary | ICD-10-CM

## 2024-01-11 DIAGNOSIS — R1032 Left lower quadrant pain: Secondary | ICD-10-CM

## 2024-01-11 NOTE — Telephone Encounter (Signed)
 Could you ask Radiology to go ahead and read her ultrasound - patient has been asking.  Thanks!

## 2024-01-16 DIAGNOSIS — Z1239 Encounter for other screening for malignant neoplasm of breast: Secondary | ICD-10-CM | POA: Diagnosis not present

## 2024-01-16 DIAGNOSIS — N859 Noninflammatory disorder of uterus, unspecified: Secondary | ICD-10-CM | POA: Diagnosis not present

## 2024-01-19 NOTE — Progress Notes (Signed)
 Remote pacemaker transmission.

## 2024-01-19 NOTE — Addendum Note (Signed)
 Addended by: Lott Rouleau A on: 01/19/2024 12:35 PM   Modules accepted: Orders

## 2024-01-24 ENCOUNTER — Encounter: Payer: Self-pay | Admitting: Internal Medicine

## 2024-01-24 NOTE — Telephone Encounter (Signed)
Please help with this

## 2024-01-25 NOTE — Telephone Encounter (Signed)
 I have called and spoke with Anne Rush about getting her PFT scheduled in [facility]. I explained that they only give us  the PFT schedule just right before the new month starts. I offered her doing PFT in our [facility] office. She stated she wanted to do PFT at [facility] and for me to call her back when I get the schedule

## 2024-01-31 ENCOUNTER — Other Ambulatory Visit: Payer: Self-pay | Admitting: Family Medicine

## 2024-02-07 DIAGNOSIS — J9601 Acute respiratory failure with hypoxia: Secondary | ICD-10-CM | POA: Diagnosis not present

## 2024-02-07 DIAGNOSIS — I5189 Other ill-defined heart diseases: Secondary | ICD-10-CM | POA: Diagnosis not present

## 2024-03-08 ENCOUNTER — Ambulatory Visit (INDEPENDENT_AMBULATORY_CARE_PROVIDER_SITE_OTHER): Payer: BC Managed Care – PPO

## 2024-03-08 ENCOUNTER — Encounter: Payer: Self-pay | Admitting: Internal Medicine

## 2024-03-08 DIAGNOSIS — I442 Atrioventricular block, complete: Secondary | ICD-10-CM | POA: Diagnosis not present

## 2024-03-08 LAB — CUP PACEART REMOTE DEVICE CHECK
Battery Remaining Longevity: 12 mo
Battery Remaining Percentage: 10 %
Battery Voltage: 2.81 V
Brady Statistic AP VP Percent: 1.9 %
Brady Statistic AP VS Percent: 1 %
Brady Statistic AS VP Percent: 98 %
Brady Statistic AS VS Percent: 1 %
Brady Statistic RA Percent Paced: 1.8 %
Brady Statistic RV Percent Paced: 99 %
Date Time Interrogation Session: 20250619020015
Implantable Lead Connection Status: 753985
Implantable Lead Connection Status: 753985
Implantable Lead Implant Date: 20170608
Implantable Lead Implant Date: 20170608
Implantable Lead Location: 753859
Implantable Lead Location: 753860
Implantable Pulse Generator Implant Date: 20170608
Lead Channel Impedance Value: 440 Ohm
Lead Channel Impedance Value: 460 Ohm
Lead Channel Pacing Threshold Amplitude: 1 V
Lead Channel Pacing Threshold Amplitude: 1.25 V
Lead Channel Pacing Threshold Pulse Width: 0.4 ms
Lead Channel Pacing Threshold Pulse Width: 0.4 ms
Lead Channel Sensing Intrinsic Amplitude: 4.1 mV
Lead Channel Setting Pacing Amplitude: 1.5 V
Lead Channel Setting Pacing Amplitude: 2 V
Lead Channel Setting Pacing Pulse Width: 0.4 ms
Lead Channel Setting Sensing Sensitivity: 2.5 mV
Pulse Gen Model: 2272
Pulse Gen Serial Number: 7903881

## 2024-03-08 NOTE — Telephone Encounter (Signed)
 Forwarding to front on PFT at [facility] and follow up with ramaswamy

## 2024-03-09 ENCOUNTER — Ambulatory Visit: Payer: Self-pay | Admitting: Cardiology

## 2024-03-09 DIAGNOSIS — I5189 Other ill-defined heart diseases: Secondary | ICD-10-CM | POA: Diagnosis not present

## 2024-03-09 DIAGNOSIS — J9601 Acute respiratory failure with hypoxia: Secondary | ICD-10-CM | POA: Diagnosis not present

## 2024-03-12 ENCOUNTER — Telehealth: Payer: Self-pay | Admitting: *Deleted

## 2024-03-12 DIAGNOSIS — Z1159 Encounter for screening for other viral diseases: Secondary | ICD-10-CM

## 2024-03-12 DIAGNOSIS — R7303 Prediabetes: Secondary | ICD-10-CM

## 2024-03-12 DIAGNOSIS — Z79899 Other long term (current) drug therapy: Secondary | ICD-10-CM

## 2024-03-12 DIAGNOSIS — E559 Vitamin D deficiency, unspecified: Secondary | ICD-10-CM

## 2024-03-12 DIAGNOSIS — E782 Mixed hyperlipidemia: Secondary | ICD-10-CM

## 2024-03-12 NOTE — Telephone Encounter (Signed)
-----   Message from Harlene Du sent at 03/07/2024  3:36 PM EDT ----- Regarding: Labs. Wed. 03/28/24 Hello,  Patient is coming in for CPE labs on Wednesday 03/28/24. Can we get orders please.   Thanks

## 2024-03-27 ENCOUNTER — Telehealth: Payer: Self-pay | Admitting: Internal Medicine

## 2024-03-27 ENCOUNTER — Other Ambulatory Visit: Payer: Self-pay

## 2024-03-27 DIAGNOSIS — R0602 Shortness of breath: Secondary | ICD-10-CM

## 2024-03-27 NOTE — Telephone Encounter (Signed)
 Copied from CRM [id number]. Topic: Appointments - Scheduling Inquiry for Clinic >> Mar 26, 2024  1:13 PM Rozanna G wrote: Reason for CRM: PT CALLED TO SCHEDULE HER PFT, I ATTEMTPED TO SCHEDULE BOTH WAYS AND IT WILL NOT ALLOW ME TOSCHEDULE THE APPT PFT can now be scheduled use the order placed on 03/27/24

## 2024-03-28 ENCOUNTER — Telehealth: Payer: Self-pay | Admitting: Internal Medicine

## 2024-03-28 ENCOUNTER — Other Ambulatory Visit (INDEPENDENT_AMBULATORY_CARE_PROVIDER_SITE_OTHER)

## 2024-03-28 DIAGNOSIS — R7303 Prediabetes: Secondary | ICD-10-CM

## 2024-03-28 DIAGNOSIS — E782 Mixed hyperlipidemia: Secondary | ICD-10-CM

## 2024-03-28 DIAGNOSIS — E559 Vitamin D deficiency, unspecified: Secondary | ICD-10-CM

## 2024-03-28 DIAGNOSIS — Z79899 Other long term (current) drug therapy: Secondary | ICD-10-CM | POA: Diagnosis not present

## 2024-03-28 LAB — CBC WITH DIFFERENTIAL/PLATELET
Basophils Absolute: 0 K/uL (ref 0.0–0.1)
Basophils Relative: 0.6 % (ref 0.0–3.0)
Eosinophils Absolute: 0.1 K/uL (ref 0.0–0.7)
Eosinophils Relative: 1.7 % (ref 0.0–5.0)
HCT: 41.4 % (ref 36.0–46.0)
Hemoglobin: 14.3 g/dL (ref 12.0–15.0)
Lymphocytes Relative: 45 % (ref 12.0–46.0)
Lymphs Abs: 2 K/uL (ref 0.7–4.0)
MCHC: 34.4 g/dL (ref 30.0–36.0)
MCV: 90.5 fl (ref 78.0–100.0)
Monocytes Absolute: 0.3 K/uL (ref 0.1–1.0)
Monocytes Relative: 7.4 % (ref 3.0–12.0)
Neutro Abs: 2.1 K/uL (ref 1.4–7.7)
Neutrophils Relative %: 45.3 % (ref 43.0–77.0)
Platelets: 189 K/uL (ref 150.0–400.0)
RBC: 4.58 Mil/uL (ref 3.87–5.11)
RDW: 12.3 % (ref 11.5–15.5)
WBC: 4.6 K/uL (ref 4.0–10.5)

## 2024-03-28 LAB — LIPID PANEL
Cholesterol: 248 mg/dL — ABNORMAL HIGH (ref 0–200)
HDL: 47 mg/dL (ref >39.00)
LDL Cholesterol: 140 mg/dL — ABNORMAL HIGH (ref 0–99)
NonHDL: 201.2
Total CHOL/HDL Ratio: 5
Triglycerides: 304 mg/dL — ABNORMAL HIGH (ref 0.0–149.0)
VLDL: 60.8 mg/dL — ABNORMAL HIGH (ref 0.0–40.0)

## 2024-03-28 LAB — HEPATIC FUNCTION PANEL
ALT: 13 U/L (ref 0–35)
AST: 16 U/L (ref 0–37)
Albumin: 4.7 g/dL (ref 3.5–5.2)
Alkaline Phosphatase: 49 U/L (ref 39–117)
Bilirubin, Direct: 0.1 mg/dL (ref 0.0–0.3)
Total Bilirubin: 0.7 mg/dL (ref 0.2–1.2)
Total Protein: 7.2 g/dL (ref 6.0–8.3)

## 2024-03-28 LAB — HEMOGLOBIN A1C: Hgb A1c MFr Bld: 5.8 % (ref 4.6–6.5)

## 2024-03-28 LAB — BASIC METABOLIC PANEL WITH GFR
BUN: 13 mg/dL (ref 6–23)
CO2: 32 meq/L (ref 19–32)
Calcium: 9.3 mg/dL (ref 8.4–10.5)
Chloride: 100 meq/L (ref 96–112)
Creatinine, Ser: 0.65 mg/dL (ref 0.40–1.20)
GFR: 95.11 mL/min (ref >60.00)
Glucose, Bld: 98 mg/dL (ref 70–99)
Potassium: 4 meq/L (ref 3.5–5.1)
Sodium: 138 meq/L (ref 135–145)

## 2024-03-28 LAB — TSH: TSH: 2.18 u[IU]/mL (ref 0.35–5.50)

## 2024-03-28 LAB — VITAMIN D 25 HYDROXY (VIT D DEFICIENCY, FRACTURES): VITD: 35.92 ng/mL (ref 30.00–100.00)

## 2024-03-28 NOTE — Telephone Encounter (Signed)
 PT wants appt w/Dr. R and PFT on the same day in Aug. Nothing avail. This would be a 15 min ILD FU Face to face. She is insisting it be when Dr. JONELLE. wanted it to be. States she knows we have held slots we can use. Please advise if I can use any held (New PT 15 min or HFU )  slots and I will call PT. (Even Dr. Ammie friends can't be seen until Sept.)

## 2024-03-29 NOTE — Telephone Encounter (Signed)
 Copied from CRM [id number]. Topic: Appointments - Appointment Scheduling >> Mar 27, 2024  4:41 PM Rilla B wrote: Patient returning a call from [place]. Per notes, I attempted to schedule PFT. Patient stated she only wanted to see Dr Geronimo after her PFT. When I attempted to schedule for Dr Geronimo first available was October. Patient states Dr Geronimo wants to see her in August and she must have her PFT first. I am unable to schedule. Please call patient back.

## 2024-03-29 NOTE — Telephone Encounter (Signed)
 If she is willing please get the pulmonary function test at [facility] or [facility] or at [facility] and then schedule her with me in a 15-minute slot.  I can even see her at 4:15 PM

## 2024-03-30 NOTE — Telephone Encounter (Signed)
 PT states it was already set up. NFN

## 2024-03-31 ENCOUNTER — Other Ambulatory Visit: Payer: Self-pay | Admitting: Family Medicine

## 2024-04-01 ENCOUNTER — Encounter: Payer: Self-pay | Admitting: Family Medicine

## 2024-04-01 NOTE — Progress Notes (Unsigned)
 [name], MD, CAQ Sports Medicine [facility] at [facility] [address] [place], [address]  Phone: [phone]  FAX: [phone]  Anne Rush - 63 y.o. female  MRN [id number]  Date of Birth: 08-03-1961  Date: 04/02/2024  PCP: Watt Mirza, MD  Referral: Watt Mirza, MD  No chief complaint on file.  Patient Care Team: Watt Mirza, MD as PCP - General (Family Medicine) Inocencio Soyla Lunger, MD as PCP - Electrophysiology (Cardiology) O'Neal, Darryle Ned, MD as PCP - Cardiology (Cardiology) Subjective:   Anne Rush is a 63 y.o. pleasant patient who presents for a medicare wellness examination:  Health Maintenance Summary Reviewed and updated, unless pt declines services.  Tobacco History Reviewed. Non-smoker Alcohol: No concerns, no excessive use Exercise Habits: Some activity, rec at least 30 mins 5 times a week STD concerns: none Drug Use: None Birth control method: n/a Menses regular: n/a Lumps or breast concerns: no Breast Cancer Family History: no  Well-known patient.  She does have significant history for pulmonary fibrosis.  She also has a history of congestive heart failure and complete heart block status post pacemaker.    Prevnar-20 Covid Mammo Pap Cologuard Flu  Health Maintenance  Topic Date Due   Medicare Annual Wellness (AWV)  Never done   COVID-19 Vaccine (1) Never done   Pneumococcal Vaccine 66-40 Years old (1 of 2 - PCV) Never done   MAMMOGRAM  04/28/2016   Cervical Cancer Screening (HPV/Pap Cotest)  09/25/2021   Fecal DNA (Cologuard)  11/04/2023   INFLUENZA VACCINE  04/20/2024   DTaP/Tdap/Td (3 - Td or Tdap) 10/28/2024   Hepatitis C Screening  Completed   HIV Screening  Completed   Zoster Vaccines- Shingrix   Completed   Hepatitis B Vaccines  Aged Out   HPV VACCINES  Aged Out   Meningococcal B Vaccine  Aged Out   Immunization History  Administered Date(s) Administered    Influenza Nasal 06/20/2017   Influenza Whole 07/04/2008, 06/18/2009, 08/27/2010   Influenza, Seasonal, Injecte, Preservative Fre 09/03/2010, 06/22/2011, 06/27/2012   Influenza,inj,Quad PF,6+ Mos 07/21/2015, 07/06/2018, 08/10/2021, 08/23/2023   Influenza-Unspecified 06/27/2012, 07/17/2014, 07/19/2016, 07/21/2019, 07/17/2022   Td 05/10/2002   Tdap 10/28/2014   Zoster Recombinant(Shingrix ) 12/08/2022, 02/08/2023    Patient Active Problem List   Diagnosis Date Noted   Chronic systolic CHF (congestive heart failure) (HCC) 07/15/2020    Priority: High   Interstitial lung disease (HCC) 04/18/2020    Priority: High   Pacemaker 10/01/2018    Priority: High   Complete heart block (HCC) s/p Pacemaker 02/26/2016    Priority: High   OSA (obstructive sleep apnea) 12/14/2021    Priority: Medium    Hyperlipidemia LDL goal <100 06/21/2007    Priority: Medium    GAD (generalized anxiety disorder) 06/05/2007    Priority: Medium    Essential hypertension 06/05/2007    Priority: Medium    Nocturnal hypoxemia 12/14/2021   GERD (gastroesophageal reflux disease) 07/15/2020   Diastolic dysfunction 04/02/2020   Vitamin D  deficiency 01/11/2018   Allergic angioedema 03/17/2016   Former tobacco use 08/28/2008   Allergic rhinitis 06/05/2007    Past Medical History:  Diagnosis Date   Chronic systolic CHF (congestive heart failure) (HCC) 07/15/2020   Complete heart block (HCC) 02/26/2016   s/p Pacemaker   Family history of ovarian cancer    Generalized anxiety disorder 07/15/2020   GERD (gastroesophageal reflux disease)    History of kidney stones    Hyperlipidemia  Hypertension    Interstitial lung disease (HCC)    Pollen allergies    Presence of permanent cardiac pacemaker    Pulmonary fibrosis (HCC)    Squamous cell carcinoma 2014   skin cancer   Subclinical hyperthyroidism 06/17/2014    Past Surgical History:  Procedure Laterality Date   CESAREAN SECTION     CYSTOSCOPY W/ URETERAL  STENT PLACEMENT Left 07/15/2020   Procedure: CYSTOSCOPY WITH RETROGRADE PYELOGRAM/URETERAL STENT PLACEMENT;  Surgeon: Twylla Glendia BROCKS, MD;  Location: [facility] ORS;  Service: Urology;  Laterality: Left;   CYSTOSCOPY/URETEROSCOPY/HOLMIUM LASER/STENT PLACEMENT Left 07/28/2020   Procedure: CYSTOSCOPY/URETEROSCOPY/HOLMIUM LASER/STENT Exchange;  Surgeon: Penne Knee, MD;  Location: [facility] OR[facility];  Service: Urology;  Laterality: Left;   EP IMPLANTABLE DEVICE N/A 02/26/2016   Procedure: Pacemaker Implant;  Surgeon: Will Gladis Norton, MD;  Location: [facility];  Service: Cardiovascular;  Laterality: N/A;   MOHS SURGERY     skin cancer face   TUBAL LIGATION      Family History  Problem Relation Age of Onset   Anxiety disorder Mother    Hypertension Mother    Ovarian cancer Mother 32   Cancer Maternal Aunt        breast   Heart failure Maternal Grandfather     Social History   Social History Narrative   Regular exercise-no    Past Medical History, Surgical History, Social History, Family History, Problem List, Medications, and Allergies have been reviewed and updated if relevant.  Review of Systems: Pertinent positives are listed above.  Otherwise, a full 14 point review of systems has been done in full and it is negative except where it is noted positive.  Objective:   There were no vitals taken for this visit.    05/21/2021   11:59 AM 10/19/2021    7:35 PM 09/02/2022    6:27 PM 09/03/2022    2:15 AM 12/08/2022    9:01 AM  Fall Risk  Falls in the past year?     0  Was there an injury with Fall?     0  Fall Risk Category Calculator     0  (RETIRED) Patient Fall Risk Level Low fall risk  Low fall risk  Moderate fall risk  Low fall risk    Patient at Risk for Falls Due to     No Fall Risks  Fall risk Follow up     Falls evaluation completed     Data saved with a previous flowsheet row definition   Ideal Body Weight:   No results found.    10/10/2023    2:50 PM 12/08/2022     9:41 AM 11/25/2021    9:10 AM 10/16/2020    8:12 AM 04/16/2019    8:49 AM  Depression screen PHQ 2/9  Decreased Interest 0 2 0 0 0  Down, Depressed, Hopeless 0 2 0 0 0  PHQ - 2 Score 0 4 0 0 0  Altered sleeping 2 0     Tired, decreased energy 2 2     Change in appetite 2 2     Feeling bad or failure about yourself  0 0     Trouble concentrating 0 1     Moving slowly or fidgety/restless 0 0     Suicidal thoughts 0 0     PHQ-9 Score 6 9     Difficult doing work/chores Not difficult at all Somewhat difficult        GEN: well developed, well  nourished, no acute distress Eyes: conjunctiva and lids normal, PERRLA, EOMI ENT: TM clear, nares clear, oral exam WNL Neck: supple, no lymphadenopathy, no thyromegaly, no JVD Pulm: clear to auscultation and percussion, respiratory effort normal CV: regular rate and rhythm, S1-S2, no murmur, rub or gallop, no bruits Chest: no scars, masses, no lumps BREAST: no lumps, no axillary LAD, no nipple discharge GI: soft, non-tender; no hepatosplenomegaly, masses; active bowel sounds all quadrants GU: Normal external female genitalia. Cervix appears intact without lesions or irritation. Vaginal canal normal without ulceration or lesion. Cervix NT to exam. Ovaries neither enlarged nor tender. (Chaperoned examination by female staff) Lymph: no cervical, axillary or inguinal adenopathy MSK: gait normal, muscle tone and strength WNL, no joint swelling, effusions, discoloration, crepitus  SKIN: clear, good turgor, color WNL, no rashes, lesions, or ulcerations Neuro: normal mental status, normal strength, sensation, and motion Psych: alert; oriented to person, place and time, normally interactive and not anxious or depressed in appearance.  All labs reviewed with patient.  Results for orders placed or performed in visit on 03/28/24  Basic metabolic panel   Collection Time: 03/28/24 10:20 AM  Result Value Ref Range   Sodium 138 135 - 145 mEq/L   Potassium 4.0  3.5 - 5.1 mEq/L   Chloride 100 96 - 112 mEq/L   CO2 32 19 - 32 mEq/L   Glucose, Bld 98 70 - 99 mg/dL   BUN 13 6 - 23 mg/dL   Creatinine, Ser 9.34 0.40 - 1.20 mg/dL   GFR 04.88 >39.99 mL/min   Calcium 9.3 8.4 - 10.5 mg/dL  CBC with Differential/Platelet   Collection Time: 03/28/24 10:20 AM  Result Value Ref Range   WBC 4.6 4.0 - 10.5 K/uL   RBC 4.58 3.87 - 5.11 Mil/uL   Hemoglobin 14.3 12.0 - 15.0 g/dL   HCT 58.5 63.9 - 53.9 %   MCV 90.5 78.0 - 100.0 fl   MCHC 34.4 30.0 - 36.0 g/dL   RDW 87.6 88.4 - 84.4 %   Platelets 189.0 150.0 - 400.0 K/uL   Neutrophils Relative % 45.3 43.0 - 77.0 %   Lymphocytes Relative 45.0 12.0 - 46.0 %   Monocytes Relative 7.4 3.0 - 12.0 %   Eosinophils Relative 1.7 0.0 - 5.0 %   Basophils Relative 0.6 0.0 - 3.0 %   Neutro Abs 2.1 1.4 - 7.7 K/uL   Lymphs Abs 2.0 0.7 - 4.0 K/uL   Monocytes Absolute 0.3 0.1 - 1.0 K/uL   Eosinophils Absolute 0.1 0.0 - 0.7 K/uL   Basophils Absolute 0.0 0.0 - 0.1 K/uL  Hepatic function panel   Collection Time: 03/28/24 10:20 AM  Result Value Ref Range   Total Bilirubin 0.7 0.2 - 1.2 mg/dL   Bilirubin, Direct 0.1 0.0 - 0.3 mg/dL   Alkaline Phosphatase 49 39 - 117 U/L   AST 16 0 - 37 U/L   ALT 13 0 - 35 U/L   Total Protein 7.2 6.0 - 8.3 g/dL   Albumin 4.7 3.5 - 5.2 g/dL  Hemoglobin J8r   Collection Time: 03/28/24 10:20 AM  Result Value Ref Range   Hgb A1c MFr Bld 5.8 4.6 - 6.5 %  Lipid panel   Collection Time: 03/28/24 10:20 AM  Result Value Ref Range   Cholesterol 248 (H) 0 - 200 mg/dL   Triglycerides 695.9 (H) 0.0 - 149.0 mg/dL   HDL 52.99 >60.99 mg/dL   VLDL 39.1 (H) 0.0 - 59.9 mg/dL   LDL Cholesterol 859 (H)  0 - 99 mg/dL   Total CHOL/HDL Ratio 5    NonHDL 201.20   VITAMIN D  25 Hydroxy (Vit-D Deficiency, Fractures)   Collection Time: 03/28/24 10:20 AM  Result Value Ref Range   VITD 35.92 30.00 - 100.00 ng/mL  TSH   Collection Time: 03/28/24 10:20 AM  Result Value Ref Range   TSH 2.18 0.35 - 5.50 uIU/mL     CUP PACEART REMOTE DEVICE CHECK Result Date: 03/08/2024 Pacemaker:  Scheduled remote reviewed. Normal device function.  Presenting rhythm: AS/VP Next remote 91 days. ML, CVRS   Assessment and Plan:     ICD-10-CM   1. Healthcare maintenance  Z00.00       Health Maintenance Exam: The patient's preventative maintenance and recommended screening tests for an annual wellness exam were reviewed in full today. Brought up to date unless services declined.  Counselled on the importance of diet, exercise, and its role in overall health and mortality. The patient's FH and SH was reviewed, including their home life, tobacco status, and drug and alcohol status.  Follow-up in 1 year for physical exam or additional follow-up below.  I have personally reviewed the Medicare Annual Wellness questionnaire and have noted 1. The patient's medical and social history 2. Their use of alcohol, tobacco or illicit drugs 3. Their current medications and supplements 4. The patient's functional ability including ADL's, fall risks, home safety risks and hearing or visual             impairment. 5. Diet and physical activities 6. Evidence for depression or mood disorders 7. Reviewed Updated provider list, see scanned forms and [facility] Snapshot.  8. Reviewed whether or not the patient has HCPOA or living will, and discussed what this means with the patient.  Recommended she bring in a copy for his chart in [facility].  The patients weight, height, BMI and visual acuity have been recorded in the chart I have made referrals, counseling and provided education to the patient based review of the above and I have provided the pt with a written personalized care plan for preventive services.  I have provided the patient with a copy of your personalized plan for preventive services. Instructed to take the time to review along with their updated medication list.  Disposition: No follow-ups on file.  Future Appointments  Date  Time Provider Department Center  04/02/2024 10:20 AM Watt Mirza, MD [facility] P[facility]  04/24/2024 10:00 AM [facility] PFT RM [facility] None  06/07/2024  7:45 AM [facility] DEVICE REMOTES [facility]  06/18/2024 10:30 AM Camnitz, Soyla Lunger, MD [facility] H[facility]  06/29/2024  8:30 AM Geronimo Amel, MD [facility] None  09/06/2024  7:45 AM [facility] HVT DEVICE REMOTES [facility]    No orders of the defined types were placed in this encounter.  There are no discontinued medications. No orders of the defined types were placed in this encounter.   Signed,  Mirza DASEN. Mairlyn Tegtmeyer, MD   Allergies as of 04/02/2024       Reactions   Codeine Hives, Itching   Sulfonamide Derivatives Hives   Paroxetine Other (See Comments)   unknown   Iodinated Contrast Media Rash   Lisinopril  Rash   Losartan  Potassium Other (See Comments)   Leg felt heavy   Neomycin-polymyxin-dexameth Itching, Swelling, Other (See Comments)   Ey drop/Swelling and itching of eye    Oxycodone -acetaminophen  Other (See Comments)   Mild SOB, feels funny   Sulfa Antibiotics Hives, Rash  Medication List        Accurate as of April 01, 2024  4:27 PM. If you have any questions, ask your nurse or doctor.          acetaminophen  500 MG tablet Commonly known as: TYLENOL  Take 1,000 mg by mouth every 6 (six) hours as needed. As needed   albuterol  108 (90 Base) MCG/ACT inhaler Commonly known as: VENTOLIN  HFA Inhale 2 puffs into the lungs every 6 (six) hours as needed for wheezing or shortness of breath.   carvedilol  25 MG tablet Commonly known as: COREG  Take 1 tablet (25 mg total) by mouth 2 (two) times daily with a meal.   diazepam  5 MG tablet Commonly known as: VALIUM  TAKE ONE TABLET BY MOUTH EVERY 8 HOURS FOR ANXIETY   famotidine  20 MG tablet Commonly known as: PEPCID  Take 1 tablet (20 mg total) by mouth daily.   fluticasone  50 MCG/ACT nasal spray Commonly known as: FLONASE  Place 2 sprays into both  nostrils daily.   nystatin  100000 UNIT/ML suspension Commonly known as: MYCOSTATIN  Take 5 mLs (500,000 Units total) by mouth 4 (four) times daily. Swish and swallow   spironolactone  25 MG tablet Commonly known as: ALDACTONE  TAKE 1 TABLET BY MOUTH DAILY

## 2024-04-02 ENCOUNTER — Ambulatory Visit (INDEPENDENT_AMBULATORY_CARE_PROVIDER_SITE_OTHER): Admitting: Family Medicine

## 2024-04-02 ENCOUNTER — Encounter: Payer: Self-pay | Admitting: Family Medicine

## 2024-04-02 VITALS — BP 120/80 | HR 71 | Temp 98.6°F | Ht 62.25 in | Wt 203.5 lb

## 2024-04-02 DIAGNOSIS — Z1211 Encounter for screening for malignant neoplasm of colon: Secondary | ICD-10-CM

## 2024-04-02 DIAGNOSIS — Z5320 Procedure and treatment not carried out because of patient's decision for unspecified reasons: Secondary | ICD-10-CM | POA: Diagnosis not present

## 2024-04-02 DIAGNOSIS — Z0000 Encounter for general adult medical examination without abnormal findings: Secondary | ICD-10-CM

## 2024-04-02 DIAGNOSIS — Z23 Encounter for immunization: Secondary | ICD-10-CM | POA: Diagnosis not present

## 2024-04-02 MED ORDER — ESCITALOPRAM OXALATE 5 MG PO TABS
5.0000 mg | ORAL_TABLET | Freq: Every day | ORAL | 3 refills | Status: DC
Start: 2024-04-02 — End: 2024-07-04

## 2024-04-02 MED ORDER — FLUTICASONE PROPIONATE 50 MCG/ACT NA SUSP: 2.0000 | Freq: Every day | NASAL | 3 refills | Status: AC

## 2024-04-02 NOTE — Telephone Encounter (Signed)
 Last office visit 12/26/23 for lower abd pain.  Last refilled 11/30/2023 for #30 with 3 refills.  Next Appt: CPE 04/02/2024.SABRA

## 2024-04-03 NOTE — Telephone Encounter (Signed)
 Patient is scheduled for PFT in the [place] office. Nothing further needed

## 2024-04-04 ENCOUNTER — Ambulatory Visit: Admitting: Family Medicine

## 2024-04-04 DIAGNOSIS — D2261 Melanocytic nevi of right upper limb, including shoulder: Secondary | ICD-10-CM | POA: Diagnosis not present

## 2024-04-04 DIAGNOSIS — Z85828 Personal history of other malignant neoplasm of skin: Secondary | ICD-10-CM | POA: Diagnosis not present

## 2024-04-04 DIAGNOSIS — D2262 Melanocytic nevi of left upper limb, including shoulder: Secondary | ICD-10-CM | POA: Diagnosis not present

## 2024-04-04 DIAGNOSIS — D225 Melanocytic nevi of trunk: Secondary | ICD-10-CM | POA: Diagnosis not present

## 2024-04-08 DIAGNOSIS — I5189 Other ill-defined heart diseases: Secondary | ICD-10-CM | POA: Diagnosis not present

## 2024-04-08 DIAGNOSIS — J9601 Acute respiratory failure with hypoxia: Secondary | ICD-10-CM | POA: Diagnosis not present

## 2024-04-09 DIAGNOSIS — N859 Noninflammatory disorder of uterus, unspecified: Secondary | ICD-10-CM | POA: Diagnosis not present

## 2024-04-17 ENCOUNTER — Telehealth: Payer: Self-pay | Admitting: Internal Medicine

## 2024-04-17 NOTE — Telephone Encounter (Signed)
 Copied from CRM [id number]. Topic: Appointments - Scheduling Inquiry for Clinic >> Apr 17, 2024  9:06 AM Anne Rush wrote: Reason for CRM: Patient states her appointment for her PFT was scheduled at the [facility] office but she is requesting to have this completed in the hospital.   Callback number: [phone]   [facility] and they are not currently scheduling PFTs do to staffing. Left voicemail for patient to keep [facility] PFT but if she would like it done at the hospital she can call us  back to get scheduled at [facility].

## 2024-04-21 DIAGNOSIS — Z1211 Encounter for screening for malignant neoplasm of colon: Secondary | ICD-10-CM | POA: Diagnosis not present

## 2024-04-24 ENCOUNTER — Ambulatory Visit: Admitting: Internal Medicine

## 2024-04-24 DIAGNOSIS — R0602 Shortness of breath: Secondary | ICD-10-CM | POA: Diagnosis not present

## 2024-04-24 LAB — PULMONARY FUNCTION TEST
DL/VA % pred: 98 %
DL/VA: 4.2 ml/min/mmHg/L
DLCO unc % pred: 87 %
DLCO unc: 16.84 ml/min/mmHg
FEF 25-75 Post: 2.41 L/s
FEF 25-75 Pre: 2.01 L/s
FEF2575-%Change-Post: 20 %
FEF2575-%Pred-Post: 107 %
FEF2575-%Pred-Pre: 89 %
FEV1-%Change-Post: 4 %
FEV1-%Pred-Post: 92 %
FEV1-%Pred-Pre: 88 %
FEV1-Post: 2.25 L
FEV1-Pre: 2.15 L
FEV1FVC-%Change-Post: 1 %
FEV1FVC-%Pred-Pre: 101 %
FEV6-%Change-Post: 4 %
FEV6-%Pred-Post: 92 %
FEV6-%Pred-Pre: 89 %
FEV6-Post: 2.82 L
FEV6-Pre: 2.71 L
FEV6FVC-%Change-Post: 0 %
FEV6FVC-%Pred-Post: 103 %
FEV6FVC-%Pred-Pre: 103 %
FVC-%Change-Post: 3 %
FVC-%Pred-Post: 89 %
FVC-%Pred-Pre: 86 %
FVC-Post: 2.82 L
FVC-Pre: 2.72 L
Post FEV1/FVC ratio: 80 %
Post FEV6/FVC ratio: 100 %
Pre FEV1/FVC ratio: 79 %
Pre FEV6/FVC Ratio: 100 %
RV % pred: 109 %
RV: 2.14 L
TLC % pred: 99 %
TLC: 4.89 L

## 2024-04-24 NOTE — Patient Instructions (Signed)
 Full PFT completed today ? ?

## 2024-04-24 NOTE — Progress Notes (Signed)
 Full PFT completed today ? ?

## 2024-04-26 ENCOUNTER — Other Ambulatory Visit: Payer: Self-pay | Admitting: Cardiology

## 2024-04-26 ENCOUNTER — Other Ambulatory Visit: Payer: Self-pay

## 2024-04-26 MED ORDER — CARVEDILOL 25 MG PO TABS: 25.0000 mg | ORAL_TABLET | Freq: Two times a day (BID) | ORAL | 1 refills | Status: DC

## 2024-04-26 MED ORDER — CARVEDILOL 25 MG PO TABS
25.0000 mg | ORAL_TABLET | Freq: Two times a day (BID) | ORAL | 0 refills | Status: DC
Start: 2024-04-26 — End: 2024-04-26

## 2024-04-27 LAB — COLOGUARD: COLOGUARD: POSITIVE — AB

## 2024-04-29 ENCOUNTER — Ambulatory Visit: Payer: Self-pay | Admitting: Family Medicine

## 2024-04-29 DIAGNOSIS — R195 Other fecal abnormalities: Secondary | ICD-10-CM

## 2024-05-04 ENCOUNTER — Encounter: Payer: Self-pay | Admitting: Physician Assistant

## 2024-05-05 ENCOUNTER — Other Ambulatory Visit: Payer: Self-pay | Admitting: Family Medicine

## 2024-05-06 NOTE — Addendum Note (Signed)
 Addended by: WATT MIRZA on: 05/06/2024 02:21 PM   Modules accepted: Orders

## 2024-05-09 DIAGNOSIS — I5189 Other ill-defined heart diseases: Secondary | ICD-10-CM | POA: Diagnosis not present

## 2024-05-09 DIAGNOSIS — J9601 Acute respiratory failure with hypoxia: Secondary | ICD-10-CM | POA: Diagnosis not present

## 2024-05-15 NOTE — Progress Notes (Signed)
 Remote pacemaker transmission.

## 2024-05-21 DIAGNOSIS — F330 Major depressive disorder, recurrent, mild: Secondary | ICD-10-CM | POA: Insufficient documentation

## 2024-05-21 NOTE — Progress Notes (Unsigned)
     Anne Rush T. Yanuel Tagg, MD, CAQ Sports Medicine [facility] at [facility] [address] [place], [address]  Phone: [phone]  FAX: [phone]  Anne Rush - 63 y.o. female  MRN [id number]  Date of Birth: Jun 22, 1961  Date: 05/23/2024  PCP: Watt Mirza, MD  Referral: Watt Mirza, MD  No chief complaint on file.  Subjective:   Anne Rush is a 63 y.o. very pleasant female patient with There is no height or weight on file to calculate BMI. who presents with the following:  Discussed the use of AI scribe software for clinical note transcription with the patient, who gave verbal consent to proceed.  This is a well-known patient with multiple medical problems including interstitial lung disease, congestive heart failure, history of complete heart block with pacemaker placement, hypertension, hyperlipidemia, significant anxiety.  General health maintenance was April 02, 2024.  At that point she was having some significant depression, severe anxiety.  Historically, she had declined antidepressants.  At that point, we did start her on Lexapro  5 mg.  She continues to use as needed Valium .  Historically, she has declined colon cancer screenings.  At her last physical, she agreed to do Cologuard, which returned positive.  She has been referred to GI for follow-up colonoscopy. History of Present Illness     Review of Systems is noted in the HPI, as appropriate  Objective:   There were no vitals taken for this visit.  GEN: No acute distress; alert,appropriate. PULM: Breathing comfortably in no respiratory distress PSYCH: Normally interactive.   Physical Exam   Laboratory and Imaging Data:  Assessment and Plan:   No diagnosis found. Assessment & Plan   Medication Management during today's office visit: No orders of the defined types were placed in this encounter.  There are no discontinued medications.  Orders placed today for  conditions managed today: No orders of the defined types were placed in this encounter.   Disposition: No follow-ups on file.  [employer] speech-to-text software was used for transcription in this dictation.  Possible transcriptional errors can occur using Animal nutritionist.   Signed,  Mirza DASEN. Dennies Coate, MD   Outpatient Encounter Medications as of 05/23/2024  Medication Sig   acetaminophen  (TYLENOL ) 500 MG tablet Take 1,000 mg by mouth every 6 (six) hours as needed. As needed   carvedilol  (COREG ) 25 MG tablet Take 1 tablet (25 mg total) by mouth 2 (two) times daily with a meal.   Cholecalciferol  (VITAMIN D3 SUPER STRENGTH) 50 MCG (2000 UT) TABS Take 1 tablet by mouth daily.   Cranberry 450 MG TABS Take 2 tablets by mouth daily.   Cyanocobalamin (VITAMIN B-12) 3000 MCG SUBL Place 2 tablets under the tongue daily.   diazepam  (VALIUM ) 5 MG tablet TAKE 1 TABLET BY MOUTH EVERY 8 HOURS FOR ANXIETY   escitalopram  (LEXAPRO ) 5 MG tablet Take 1 tablet (5 mg total) by mouth daily.   famotidine  (PEPCID ) 20 MG tablet Take 1 tablet (20 mg total) by mouth daily.   fluticasone  (FLONASE ) 50 MCG/ACT nasal spray Place 2 sprays into both nostrils daily.   Magnesium  Citrate 200 MG TABS Take 2 tablets by mouth daily.   spironolactone  (ALDACTONE ) 25 MG tablet TAKE 1 TABLET BY MOUTH DAILY   vitamin C  (ASCORBIC ACID ) 250 MG tablet Take 1,000 mg by mouth daily.   Facility-Administered Encounter Medications as of 05/23/2024  Medication   albuterol  (PROVENTIL ) (2.5 MG/3ML) 0.083% nebulizer solution 2.5 mg

## 2024-05-23 ENCOUNTER — Ambulatory Visit (INDEPENDENT_AMBULATORY_CARE_PROVIDER_SITE_OTHER): Admitting: Family Medicine

## 2024-05-23 ENCOUNTER — Encounter: Payer: Self-pay | Admitting: Family Medicine

## 2024-05-23 ENCOUNTER — Other Ambulatory Visit: Payer: Self-pay | Admitting: Cardiology

## 2024-05-23 VITALS — BP 130/88 | HR 68 | Temp 97.7°F | Ht 62.25 in | Wt 201.1 lb

## 2024-05-23 DIAGNOSIS — F330 Major depressive disorder, recurrent, mild: Secondary | ICD-10-CM | POA: Diagnosis not present

## 2024-05-23 DIAGNOSIS — F411 Generalized anxiety disorder: Secondary | ICD-10-CM

## 2024-05-23 DIAGNOSIS — R195 Other fecal abnormalities: Secondary | ICD-10-CM | POA: Diagnosis not present

## 2024-06-05 ENCOUNTER — Telehealth: Payer: Self-pay | Admitting: Family Medicine

## 2024-06-05 NOTE — Telephone Encounter (Signed)
 Copied from CRM [id number]. Topic: Referral - Status >> May 28, 2024  2:12 PM Corin V wrote: Reason for CRM: Olam with [facility] called to advise that Dr. Stevens cannot see patient for referral but it does appear she is already scheduled with [facility] office instead

## 2024-06-07 ENCOUNTER — Ambulatory Visit (INDEPENDENT_AMBULATORY_CARE_PROVIDER_SITE_OTHER): Payer: BC Managed Care – PPO

## 2024-06-07 DIAGNOSIS — I442 Atrioventricular block, complete: Secondary | ICD-10-CM

## 2024-06-07 LAB — CUP PACEART REMOTE DEVICE CHECK
Battery Remaining Longevity: 9 mo
Battery Remaining Percentage: 7 %
Battery Voltage: 2.77 V
Brady Statistic AP VP Percent: 3.1 %
Brady Statistic AP VS Percent: 1 %
Brady Statistic AS VP Percent: 97 %
Brady Statistic AS VS Percent: 1 %
Brady Statistic RA Percent Paced: 3 %
Brady Statistic RV Percent Paced: 99 %
Date Time Interrogation Session: 20250918020019
Implantable Lead Connection Status: 753985
Implantable Lead Connection Status: 753985
Implantable Lead Implant Date: 20170608
Implantable Lead Implant Date: 20170608
Implantable Lead Location: 753859
Implantable Lead Location: 753860
Implantable Pulse Generator Implant Date: 20170608
Lead Channel Impedance Value: 430 Ohm
Lead Channel Impedance Value: 450 Ohm
Lead Channel Pacing Threshold Amplitude: 1 V
Lead Channel Pacing Threshold Amplitude: 1.25 V
Lead Channel Pacing Threshold Pulse Width: 0.4 ms
Lead Channel Pacing Threshold Pulse Width: 0.4 ms
Lead Channel Sensing Intrinsic Amplitude: 3.8 mV
Lead Channel Setting Pacing Amplitude: 1.5 V
Lead Channel Setting Pacing Amplitude: 2 V
Lead Channel Setting Pacing Pulse Width: 0.4 ms
Lead Channel Setting Sensing Sensitivity: 2.5 mV
Pulse Gen Model: 2272
Pulse Gen Serial Number: 7903881

## 2024-06-09 DIAGNOSIS — I5189 Other ill-defined heart diseases: Secondary | ICD-10-CM | POA: Diagnosis not present

## 2024-06-11 ENCOUNTER — Ambulatory Visit: Payer: Self-pay | Admitting: Cardiology

## 2024-06-12 NOTE — Progress Notes (Signed)
 Remote PPM Transmission

## 2024-06-18 ENCOUNTER — Encounter: Admitting: Cardiology

## 2024-06-19 ENCOUNTER — Encounter: Admitting: Student

## 2024-06-26 NOTE — Progress Notes (Unsigned)
 Ellouise Console, PA-C [address] [place], [place]  [address] Phone: [phone]   Gastroenterology Consultation  Referring Provider:     Watt Mirza, MD Primary Care Physician:  Watt Mirza, MD Primary Gastroenterologist:  Ellouise Console, PA-C / Dr. Gordy Starch  Reason for Consultation:     Fatigue, abdominal pain, positive Cologuard        HPI:   Anne Rush is a 63 y.o. y/o female referred for consultation & management  by Watt Mirza, MD.    New patient.  Here to evaluate fatigue and abdominal pain.  Currently on Pepcid  20 mg daily for acid reflux.  Takes vitamin B12.  She has multiple upper and lower GI symptoms.  She has had bilateral lower abdominal pain since 10/2023 located in the LLQ and RLQ.  She has pain in her left groin and left leg.  Increased pain with going from sitting to standing and better after she moves around.  She saw her gynecologist and pelvic ultrasound was unrevealing.  She has bowel irregularities.  Has had some mild constipation alternating with diarrhea.  She denies rectal bleeding.  Typically has a bowel movement 3 times daily with yellow stools.  She admits to acid reflux, belching, bloating, gas, dyspepsia, decreased appetite.  Takes Pepcid  which helps.  Her weight is down 15 pounds in the past 6 months, unintentional.  She denies any family history of colon or GI malignancies.  No family history of IBD or celiac.  04/21/2024: Positive screening Cologuard test.  She has never had a colonoscopy, EGD or GI evaluation.  03/28/2024 labs: Normal CBC (Hgb 14.3), normal CMP.  12/2023 CT abdomen pelvis with contrast (for LLQ pain): No acute abnormality in the abdomen or pelvis.  Sigmoid diverticulosis but no diverticulitis.  PMH: CHF, heart block s/p pacemaker, hypertension, interstitial lung disease, OSA, GERD, anxiety, hyperlipidemia.  02/2023 echo LVEF 60 to 65%.  She is not on oxygen .  Cardiologist Dr. Inocencio.  Pulmonologist Dr.  Geronimo.  Past Medical History:  Diagnosis Date   Chronic systolic CHF (congestive heart failure) (HCC) 07/15/2020   Complete heart block (HCC) 02/26/2016   s/p Pacemaker   Family history of ovarian cancer    Generalized anxiety disorder 07/15/2020   GERD (gastroesophageal reflux disease)    History of kidney stones    Hyperlipidemia    Hypertension    Interstitial lung disease (HCC)    Presence of permanent cardiac pacemaker    Pulmonary fibrosis (HCC)    Squamous cell carcinoma 2014   skin cancer   Subclinical hyperthyroidism 06/17/2014    Past Surgical History:  Procedure Laterality Date   CESAREAN SECTION     CYSTOSCOPY W/ URETERAL STENT PLACEMENT Left 07/15/2020   Procedure: CYSTOSCOPY WITH RETROGRADE PYELOGRAM/URETERAL STENT PLACEMENT;  Surgeon: Twylla Glendia BROCKS, MD;  Location: [facility];  Service: Urology;  Laterality: Left;   CYSTOSCOPY/URETEROSCOPY/HOLMIUM LASER/STENT PLACEMENT Left 07/28/2020   Procedure: CYSTOSCOPY/URETEROSCOPY/HOLMIUM LASER/STENT Exchange;  Surgeon: Penne Knee, MD;  Location: [facility];  Service: Urology;  Laterality: Left;   EP IMPLANTABLE DEVICE N/A 02/26/2016   Procedure: Pacemaker Implant;  Surgeon: Will Gladis Inocencio, MD;  Location: [facility];  Service: Cardiovascular;  Laterality: N/A;   MOHS SURGERY     skin cancer face   TUBAL LIGATION      Prior to Admission medications   Medication Sig Start Date End Date Taking? Authorizing Provider  acetaminophen  (TYLENOL ) 500 MG tablet Take 1,000 mg by mouth  every 6 (six) hours as needed. As needed    [provider]  carvedilol  (COREG ) 25 MG tablet Take 1 tablet (25 mg total) by mouth 2 (two) times daily with a meal. 04/26/24   Camnitz, Soyla Lunger, MD  Cholecalciferol  (VITAMIN D3 SUPER STRENGTH) 50 MCG (2000 UT) TABS Take 1 tablet by mouth daily.    [provider]  Cranberry 450 MG TABS Take 2 tablets by mouth daily.    [provider]  Cyanocobalamin  (VITAMIN B-12) 3000 MCG SUBL Place 2 tablets under the tongue daily.    [provider]  diazepam  (VALIUM ) 5 MG tablet TAKE 1 TABLET BY MOUTH EVERY 8 HOURS FOR ANXIETY 04/02/24   Copland, Jacques, MD  escitalopram  (LEXAPRO ) 5 MG tablet Take 1 tablet (5 mg total) by mouth daily. 04/02/24   Copland, Jacques, MD  famotidine  (PEPCID ) 20 MG tablet Take 1 tablet (20 mg total) by mouth daily. 04/06/20   Dickie Begun, MD  fluticasone  (FLONASE ) 50 MCG/ACT nasal spray Place 2 sprays into both nostrils daily. 04/02/24   Copland, Jacques, MD  Magnesium  Citrate 200 MG TABS Take 2 tablets by mouth daily.    [provider]  spironolactone  (ALDACTONE ) 25 MG tablet TAKE 1 TABLET BY MOUTH DAILY 05/07/24   Copland, Jacques, MD  vitamin C  (ASCORBIC ACID ) 250 MG tablet Take 1,000 mg by mouth daily.    [provider]    Family History  Problem Relation Age of Onset   Anxiety disorder Mother    Hypertension Mother    Ovarian cancer Mother 48   Prostate cancer Father    Cancer Maternal Aunt        breast   Heart disease Maternal Grandfather    Heart failure Maternal Grandfather    Colon cancer Neg Hx    Colon polyps Neg Hx    Esophageal cancer Neg Hx    Pancreatic cancer Neg Hx    Stomach cancer Neg Hx      Social History   Tobacco Use   Smoking status: Former    Current packs/day: 0.00    Average packs/day: 0.5 packs/day for 30.0 years (15.0 ttl pk-yrs)    Types: Cigarettes    Start date: 53    Quit date: 2012    Years since quitting: 13.7   Smokeless tobacco: Never  Vaping Use   Vaping status: Never Used  Substance Use Topics   Alcohol use: No   Drug use: No    Allergies as of 06/27/2024 - Review Complete 05/23/2024  Allergen Reaction Noted   Codeine Hives and Itching    Sulfonamide derivatives Hives 10/21/2006   Paroxetine Other (See Comments) 03/08/2012   Iodinated contrast media Rash 01/11/2024   Lisinopril  Rash 02/29/2016   Losartan  potassium Other (See  Comments) 08/23/2018   Neomycin-polymyxin-dexameth Itching, Swelling, and Other (See Comments) 03/13/2018   Oxycodone -acetaminophen  Other (See Comments) 10/21/2006   Sulfa antibiotics Hives and Rash 02/27/2013    Review of Systems:    All systems reviewed and negative except where noted in HPI.   Physical Exam:  BP 136/82   Pulse 61   Ht 5' 2.25 (1.581 m)   Wt 198 lb (89.8 kg)   SpO2 95%   BMI 35.92 kg/m  No LMP recorded. Patient is postmenopausal.  General:   Alert,  Well-developed, well-nourished, pleasant and cooperative in NAD Lungs:  Respirations even and unlabored.  Clear throughout to auscultation.   No wheezes, crackles, or rhonchi. No acute distress.  Heart:  Regular rate and rhythm; no murmurs, clicks, rubs, or gallops. Abdomen:  Normal bowel sounds.  No bruits.  Soft, and non-distended without masses, hepatosplenomegaly or hernias noted.  No Tenderness.  No guarding or rebound tenderness.    Neurologic:  Alert and oriented x3;  grossly normal neurologically. Psych:  Alert and cooperative. Normal mood and affect.  Imaging Studies: CUP PACEART REMOTE DEVICE CHECK Result Date: 06/07/2024 PPM Scheduled remote reviewed. Normal device function.  Presenting rhythm: AS/VP Next remote 91 days. LA, CVRS   Labs: CBC    Component Value Date/Time   WBC 4.6 03/28/2024 1020   RBC 4.58 03/28/2024 1020   HGB 14.3 03/28/2024 1020   HCT 41.4 03/28/2024 1020   PLT 189.0 03/28/2024 1020   MCV 90.5 03/28/2024 1020    CMP     Component Value Date/Time   NA 138 03/28/2024 1020   K 4.0 03/28/2024 1020   CL 100 03/28/2024 1020   CO2 32 03/28/2024 1020   GLUCOSE 98 03/28/2024 1020   BUN 13 03/28/2024 1020   CREATININE 0.65 03/28/2024 1020   CALCIUM 9.3 03/28/2024 1020   PROT 7.2 03/28/2024 1020   ALBUMIN 4.7 03/28/2024 1020   AST 16 03/28/2024 1020   ALT 13 03/28/2024 1020   ALKPHOS 49 03/28/2024 1020   BILITOT 0.7 03/28/2024 1020   GFRNONAA >60 09/03/2022 0329   GFRAA  >60 04/05/2020 1059    Assessment and Plan:   Debroh Sieloff Fife is a 63 y.o. y/o female has been referred for:   1.  Positive Cologuard - Scheduling Colonoscopy I discussed risks of colonoscopy with patient to include risk of bleeding, colon perforation, and risk of sedation.  Patient expressed understanding and agrees to proceed with colonoscopy.  - Patient requested to schedule procedures with Dr. Albertus.  2.  Abdominal pain: Negative abdominal pelvic CT 12/2023.  Left Groin pain, positional, most likely musculoskeletal pain. - Reassurance  3.  GERD / Decrease Appetite / Mild Weight Loss - Scheduling EGD I discussed risks of EGD with patient to include risk of bleeding, perforation, and risk of sedation.  Patient expressed understanding and agrees to proceed with EGD.  - Continue Pepcid   4.  Fatigue: Recent CBC and CMP labs normal.  5.  Comorbidities: CHF, heart block s/p pacemaker, hypertension, interstitial lung disease, OSA, GERD, anxiety, hyperlipidemia.  02/2023 echo LVEF 60 to 65%.  She is not on oxygen .  Cardiologist Dr. Inocencio.  Pulmonologist Dr. Geronimo. - Requesting cardiology clearance for EGD and colonoscopy procedures with sedation scheduled in [facility].  Follow up With Dr. Albertus after Procedures.  Ellouise Console, PA-C

## 2024-06-27 ENCOUNTER — Ambulatory Visit (INDEPENDENT_AMBULATORY_CARE_PROVIDER_SITE_OTHER): Admitting: Physician Assistant

## 2024-06-27 ENCOUNTER — Encounter: Payer: Self-pay | Admitting: Physician Assistant

## 2024-06-27 ENCOUNTER — Telehealth: Payer: Self-pay

## 2024-06-27 VITALS — BP 136/82 | HR 61 | Ht 62.25 in | Wt 198.0 lb

## 2024-06-27 DIAGNOSIS — K219 Gastro-esophageal reflux disease without esophagitis: Secondary | ICD-10-CM | POA: Diagnosis not present

## 2024-06-27 DIAGNOSIS — R195 Other fecal abnormalities: Secondary | ICD-10-CM

## 2024-06-27 DIAGNOSIS — R1084 Generalized abdominal pain: Secondary | ICD-10-CM

## 2024-06-27 DIAGNOSIS — R109 Unspecified abdominal pain: Secondary | ICD-10-CM

## 2024-06-27 DIAGNOSIS — R630 Anorexia: Secondary | ICD-10-CM | POA: Diagnosis not present

## 2024-06-27 DIAGNOSIS — R634 Abnormal weight loss: Secondary | ICD-10-CM

## 2024-06-27 MED ORDER — NA SULFATE-K SULFATE-MG SULF 17.5-3.13-1.6 GM/177ML PO SOLN: 1.0000 | Freq: Once | ORAL | 0 refills | Status: AC

## 2024-06-27 NOTE — Telephone Encounter (Signed)
   Name: Anne Rush  DOB: [birth year]  MRN: [id number]  Primary Cardiologist: Darryle ONEIDA Decent, MD  Chart reviewed as part of pre-operative protocol coverage. Because of [name]'s past medical history and time since last visit, she will require a follow-up in-office visit in order to better assess preoperative cardiovascular risk with general cardiology.  Has been followed only by EP, Dr. Inocencio.Last seen 04/06/2023 by [facility] and on 05/13/2020 by Dr. Decent.  Pre-op covering staff: - Please schedule appointment and call patient to inform them. If patient already had an upcoming appointment within acceptable timeframe, please add pre-op clearance to the appointment notes so provider is aware. - Please contact requesting surgeon's office via preferred method (i.e, phone, fax) to inform them of need for appointment prior to surgery.    [name], NP  06/27/2024, 3:42 PM

## 2024-06-27 NOTE — Telephone Encounter (Signed)
 [facility] Pre-operative Risk Assessment     Request for surgical clearance:     Endoscopy Procedure  What type of surgery is being performed?     Colonoscopy and Endoscopy  When is this surgery scheduled?     08/09/24  What type of clearance is required ?   Cardiac  Are there any medications that need to be held prior to surgery and how long? N/A  Practice name and name of physician performing surgery?      [facility]  What is your office phone and fax number?      Phone- [phone]  Fax- [phone]  Anesthesia type (None, local, MAC, general) ?       MAC   Please route your response to Alethea Blocker, CMA

## 2024-06-27 NOTE — Patient Instructions (Addendum)
 You have been scheduled for an Endoscopy and Colonoscopy. Please follow the written instructions given to you at your visit today.  If you use inhalers (even only as needed), please bring them with you on the day of your procedure.  DO NOT TAKE 7 DAYS PRIOR TO TEST- Trulicity (dulaglutide) Ozempic, Wegovy (semaglutide) Mounjaro (tirzepatide) Bydureon Bcise (exanatide extended release)  DO NOT TAKE 1 DAY PRIOR TO YOUR TEST Rybelsus (semaglutide) Adlyxin (lixisenatide) Victoza (liraglutide) Byetta (exanatide) ___________________________________________________________________________  Please follow up sooner if symptoms increase or worsen __________________________________________________________________________  Due to recent changes in healthcare laws, you may see the results of your imaging and laboratory studies on MyChart before your provider has had a chance to review them.  We understand that in some cases there may be results that are confusing or concerning to you. Not all laboratory results come back in the same time frame and the provider may be waiting for multiple results in order to interpret others.  Please give us  48 hours in order for your provider to thoroughly review all the results before contacting the office for clarification of your results.   Thank you for trusting me with your gastrointestinal care!   Ellouise Console, PA-C _______________________________________________________  If your blood pressure at your visit was 140/90 or greater, please contact your primary care physician to follow up on this.  _______________________________________________________  If you are age 70 or older, your body mass index should be between 23-30. Your Body mass index is 35.92 kg/m. If this is out of the aforementioned range listed, please consider follow up with your Primary Care Provider.  If you are age 49 or younger, your body mass index should be between 19-25. Your Body  mass index is 35.92 kg/m. If this is out of the aformentioned range listed, please consider follow up with your Primary Care Provider.   ________________________________________________________  The [facility] providers would like to encourage you to use MYCHART to communicate with providers for non-urgent requests or questions.  Due to long hold times on the telephone, sending your provider a message by [facility] may be a faster and more efficient way to get a response.  Please allow 48 business hours for a response.  Please remember that this is for non-urgent requests.  _______________________________________________________

## 2024-06-27 NOTE — Telephone Encounter (Signed)
 1st attempt: Left message for the pt to call our office back to get schedule for IN OFFICE appt for preop clearance.  Pt will need to be scheduled as a New Pt with a General Cardiologist or can be scheduled with [facility] at 11 am spot for preop clearance   Per [name], NP

## 2024-06-27 NOTE — Telephone Encounter (Signed)
 Called and spoke with patient.  Explained that Dr. Inocencio will be glad to assess pre op clearance need at her visit next week.  Aware we can give clearance and she will not need to see another provider for this.  Patient verbalized understanding and agreeable to plan.

## 2024-06-27 NOTE — Telephone Encounter (Signed)
 Pt called back. I stated about scheduling her with a gen card for the preop clearance as EP does not generally do preop clearance. Per the preop APP to schedule gen card appt on our HF 1st schedule. Pt told me that Dr. Inocencio is her cardiologist and manages her device. Pt states she is not going to see a general cardiologist, what are they going to do for me.   Pt states she would like to d/w Dr. Shelton about this. I did inform the pt that if Dr. Inocencio feels she needs to see gen card then we will schedule gen card. I informed the pt that I will let Dr. Inocencio know of our conversation today.

## 2024-06-29 ENCOUNTER — Encounter: Payer: Self-pay | Admitting: Internal Medicine

## 2024-06-29 ENCOUNTER — Ambulatory Visit (INDEPENDENT_AMBULATORY_CARE_PROVIDER_SITE_OTHER): Admitting: Internal Medicine

## 2024-06-29 VITALS — BP 124/72 | HR 60 | Temp 98.9°F | Ht 62.5 in | Wt 196.2 lb

## 2024-06-29 DIAGNOSIS — J438 Other emphysema: Secondary | ICD-10-CM

## 2024-06-29 DIAGNOSIS — Z01811 Encounter for preprocedural respiratory examination: Secondary | ICD-10-CM

## 2024-06-29 DIAGNOSIS — J849 Interstitial pulmonary disease, unspecified: Secondary | ICD-10-CM

## 2024-06-29 DIAGNOSIS — Z23 Encounter for immunization: Secondary | ICD-10-CM | POA: Diagnosis not present

## 2024-06-29 LAB — SEDIMENTATION RATE: Sed Rate: 10 mm/h (ref 0–30)

## 2024-06-29 NOTE — Patient Instructions (Addendum)
 ICD-10-CM   1. Preop respiratory exam  Z01.811     2. Interstitial lung disease (HCC)  J84.9     3. Paraseptal emphysema (HCC)  J43.8     4. Flu vaccine need  Z23         #Preoperative Pulmonary Eval for Endoscopy and COlonoscopy  Low risk for prolonged mechanical ventilator dependence after procedure  Plan  - prefer procedure in hospital setting to be on safe side   #Interstitial lung disease  -Clinical stable over time; based on CT scan chest 2024 pulmonary function test Aug 2025 - needs ongoing monitoring  Plan - Do spirometry and DLCO in 5 months -recheck Quanti gold, ESR, RF, ANA and CCP  #Emphysema  -Clinically stable  Plan - Continue Spiriva  as before  #Vaccine counseling  Plan - Flu shot today 06/29/2024    Followup  -Return to see Dr. Geronimo for a 15-minute visit in 5 months after pulmonary function testing

## 2024-06-29 NOTE — Addendum Note (Signed)
 Addended by: BURT EVERTT RAMAN on: 06/29/2024 09:14 AM   Modules accepted: Orders

## 2024-06-29 NOTE — Progress Notes (Signed)
 [name]      History     63 y/o F, former smoker, who presented to [facility] on 7/13 with worsening shortness of breath.    The patient reports her increase in shortness of breath began around the first of July.  She had noted her oxygen  levels to be in the high 80's at home.  She reports progressively increased shortness of breath, dyspnea with exertion and difficulty walking a flight of stairs.  She has also noted a tightness in the middle of her chest.  She has not had a COVID vaccine.     The patient is a Conservator, museum/gallery.  She is a former smoker quit 2012 after diagnosis of ILD.  She was previously followed by Pulmonary at [facility] (Dr. Bronwen, last seen in 2016) for ground glass opacities but has not seen them in several years due to financial constraints. She was supposed to have a lung biopsy. Her symptoms at the time were thought to be RBILD.  At that time she quit smoking and her FEV1 increased from 85 to >90 and infiltrates improved on CT.    ER evaluation included a CTA chest which was negative for PE but showed worsening ILD from her prior CT scan.  Troponin flat.     PCCM consulted for pulmonary evaluation.   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  Since it started 1 month ago is getting worse.  There is episodic dyspnea.  Severity is listed below.  She also has a cough that she reports specifically started on Monday, March 31, 2020.  Since it started and with steroids it is better.  It is mild in intensity.  She does bring up some phlegm that is green.  A separate brown speckles.  There is no hemoptysis.  Cough does not get worse when she lies down.  Does not affect her voice.  Does not clear her throat.  No tickle.  All the symptoms started  approximately 5 weeks before admission.        Past Medical History : Positive for heart failure.  Third-degree heart block she has a pacer.  This has been going on for several years.  Currently ejection fraction reported as slightly low normal but according to Dr. Verdene and the hospitalist cardiology reviewed the echo again and feel the ejection fraction is normal.  Current BNP is normal.  She has obesity.  Otherwise negative past medical history.  No COPD or asthma.  No collagen vascular disease no scleroderma no lupus no polymyositis.  Autoimmune panel is all negative.  No stroke or thyroid  disease.  No hepatitis.  No history of blood clot.     ROS: Positive for dyspnea.  Was stable dyspnea all the way from 2012 up until recently 5 weeks ago.  There is associated with fatigue and arthralgia.  No dysphagia.  No acid reflux.  No vomiting no nausea.     FAMILY HISTORY of LUNG DISEASE: Positive for COPD in the diet and asthma in the past.  No history of pulmonary fibrosis.  No cystic fibrosis denies hypersensitive pneumonitis no autoimmune disease.     EXPOSURE HISTORY:  -She did smoke cigarettes between 1979 and 2012.  She quit smoking in 2012 when  her interstitial lung disease was diagnosed.  She smoked around 10 cigarettes a day.  No cigars.  No passive smoking.  No vaping.  No marijuana use.  No cocaine use.  No intravenous drug use.     HOME and HOBBY DETAILS : [place]  Is a single-family home is also 63 years of age.  In the house there is no dampness.  There is no mold or mildew in the shower curtain.  There is no mold or mildew anywhere in the house.  There is no humidifier in the house no CPAP use in the house.  No nebulizer machine.  No steam iron.  No Jacuzzi.  No misting Fountain in the house.  No pet birds or parakeets.  No pet gerbils.  No feather pillows or blankets.  No music habits.  No gardening habits.   She does throw prone to the deer but there is no mold in the:     OCCUPATIONAL HISTORY (122 questions) : There is significant water damage or mold exposure at work.  She works for [employer] since 2016.  Prior to this she was not working any atmosphere that had dampness of mold.  She states the building is a very old building.  She says that there has been significant water damage and water leak.  Water has port in into the several rooms in the office.  She says there is mold in the hallways.  There is mold in the break room.  There is mold in the bosses office.  She is not sure but she thinks there is mold even in her cubicle.  Is been going on for several years but increased in the last few years.  She says on her desk every morning there are black spots but she does not know what it is.  Her husband showed pictures of the office of the right leg significant dampness and rotting of the walls and floor.  She works as a Catering manager.  It is just her and her boss.  Rest of the questionnaire is negative.     PULMONARY TOXICITY HISTORY (27 items): Never been on urinary antibiotic Macrodantin.  Currently on prednisone .     RECENT LABS   Results for RUCHEL, BRANDENBURGER (MRN [id number]) as of 04/04/2020 12:42   Ref. Range 04/02/2020 18:57  Anti Nuclear Antibody (ANA) Latest Ref Range: Negative  Negative  ANCA Proteinase 3 Latest Ref Range: 0.0 - 3.5 U/mL <3.5  CCP Antibodies IgG/IgA Latest Ref Range: 0 - 19 units 4  Myeloperoxidase Abs Latest Ref Range: 0.0 - 9.0 U/mL <9.0  RA Latex Turbid. Latest Ref Range: 0.0 - 13.9 IU/mL <10.0      CT Chest High Resolution   Result Date: 04/03/2020 CLINICAL DATA:  Inpatient. Evaluate interstitial lung disease. Dyspnea. EXAM: CT CHEST WITHOUT CONTRAST TECHNIQUE: Multidetector CT imaging of the chest was performed following the standard protocol without intravenous contrast. High resolution imaging of the lungs, as well as inspiratory and expiratory imaging, was  performed. COMPARISON:  04/02/2020 chest CT angiogram.  10/08/2010 chest CT. FINDINGS: Cardiovascular: Normal heart size. No significant pericardial effusion/thickening. Two lead left subclavian pacemaker with lead tips in the right atrium and right ventricular apex. Mildly atherosclerotic nonaneurysmal thoracic aorta. Normal caliber pulmonary arteries. Mediastinum/Nodes: No discrete thyroid  nodules. Unremarkable esophagus. No pathologically enlarged axillary, mediastinal or hilar lymph nodes, noting limited sensitivity for the detection of  hilar adenopathy on this noncontrast study. Lungs/Pleura: No pneumothorax. No pleural effusion. Mild centrilobular and paraseptal emphysema in the upper lobes. No acute consolidative airspace disease or lung masses. Right middle lobe solid 7 mm pulmonary nodule along the minor fissure (series 7/image 65), stable since 2012 CT, considered benign. No additional significant pulmonary nodules. Patchy confluent peripheral reticulation and peripheral lines throughout both lungs with associated mild traction bronchiectasis and architectural distortion, with regions of relative sparing of the immediate subpleural lungs. No clear apicobasilar gradient to these findings. No frank honeycombing (mild subpleural cystic changes in the upper lobes are favored represent paraseptal emphysema). These findings have progressed significantly since 10/08/2010 chest CT. Upper abdomen: No acute abnormality. Musculoskeletal: No aggressive appearing focal osseous lesions. Moderate thoracic spondylosis. IMPRESSION: 1. Spectrum of findings compatible with fibrotic interstitial lung disease without frank honeycombing or clear apicobasilar gradient. Findings have progressed since 2012 chest CT. The regions of immediate subpleural sparing are more suggestive of fibrotic phase nonspecific interstitial pneumonia (NSIP), although usual interstitial pneumonia (UIP) is on the differential particularly given  progression. Findings are indeterminate for UIP per consensus guidelines: Diagnosis of Idiopathic Pulmonary Fibrosis: An Official ATS/ERS/JRS/ALAT Clinical Practice Guideline. Am JINNY Honey Crit Care Med Vol 198, Iss 5, [phone], May 21 2017. 2. Aortic Atherosclerosis (ICD10-I70.0) and Emphysema (ICD10-J43.9). Electronically Signed   By: Selinda DELENA Blue M.D.   On: 04/03/2020 17:23   COURS  Significant Hospital Events   7/13 Admit   7/15 -  she says in jan 2012 dx with ILD . Jan 2021 was CT. In may 2012 she quti smoking. Followed with Dr Ralston. Last visit with him and CT was in 2015. But stable till June 2021 and desaturating with exercise at home. Now needing oxygen . She is nervous about procedures incluidng bronch. Feels better after steroids    04/04/2020 - > feels better. 87% on RA at rest now.  Husband reports mild confusion overnight.  He is wondering if the lorazepam , steroids and oxygen  are contributing to this.  This morning she not as happy as and cheerful as before.  Otherwise no new complaints.  Overall she feels better since admission and attributes to steroids.  We went over the ILD questionnaire and is documented below.  Assessment & Plan:    #Baseline: Interstitial lung disease -in 2012.  Previous smoker and quit and stable   #Current: Significant mold exposure x4 years especially worse in the last few years.  Interstitial lung disease flareup with new oxygen  dependency this admission.  High-resolution CT chest alternative pattern to UIP but no clear description of air trapping   -Discussion: She might have had RB ILD in the past or NSIP based on the CT scan appearance.  She is doing stable after quitting smoking but in the last few years she has had mold exposure and it has been a deterioration and presentation with symptoms suggestive of ILD flareup.  It appears that systolic heart failure is not under consideration especially with normal BNP.  Serologies are negative.  She does not  want any biopsy or invasive procedures.  Clinically this is not UIP/IPF.  There is no evidence of autoimmune interstitial lung disease.  Baseline features overall could be NSIP or hypersensitive pneumonitis [although no air-trapping] or possibly smokers interstitial lung disease.  However the current flareup seems temporally related to mold exposure.  Is the only inciting antigen that we can find currently.  And therefore this could be a flareup and progression of previous NSIP or she  has underlying hypersensitive pneumonitis.   Plan -Under no circumstances she should go back to her work atmosphere where there is mold exposure   -Needs prednisone  for few to several weeks and may be 1 few months depending on the course [she seems to be improving although there is some mild confusion last night which could be multifactorial from benzodiazepines and steroids] -> okay to switch to p.o. prednisone  April 05, 2020 at 50 mg/day x 2 weeks and then 40 mg/day x 2 weeks and then 30 mg/day x 2 weeks and then 20 mg/day x 2 weeks and then 10 mg/day to continue until further instructions   -Obtain hypersensitive pneumonitis blood panel while in the hospital and blood QuantiFERON gold   -Obtain spirometry and DLCO while in the hospital if possible   -Seems to still need oxygen  at rest 2 L [today she is 87% on room air at rest although this is an improvement]   -Meet with Engineer, civil (consulting) for disability information which she is interested   - Prognosis: Unclear prognosis.  It is possible that with steroids over few to several weeks or a few months that she improves and goes back to baseline.  It is also possible that she has only partial improvement.  There is also a small chance she just deals with progressive ILD in which case antifibrotic's would be indicated as an outpatient.  Overall I am optimistic that she would have at least a partial improvement   -She would need to lose weight and get fit (given  age below 23 -in the long run she might be a candidate for lung transplant if the need arose] -we will address this in the office   -Hospitalist to deal with encephalopathy if present.  At this point in time I have discontinued the lorazepam  but left on her home diazepam      - can go home 04/05/20= after social work and o2 asessment   She and her husband updated   Results for TANIKA, BRACCO (MRN [id number]) as of 04/03/2020 13:38   Ref. Range 10/12/2010 20:44 05/12/2015 09:18 05/12/2015 09:33 04/02/2020 18:57  Anti Nuclear Antibody (ANA) Latest Ref Range: NEGATIVE  NEG NEG      Cyclic Citrullin Peptide Ab Latest Ref Range: 0.0 - 5.0 U/mL   <2.0      ds DNA Ab Latest Units: IU/mL   1      RA Latex Turbid. Latest Ref Range: 0.0 - 13.9 IU/mL <10 IU/mL <10   <10.0  Tissue Transglut Ab Latest Ref Range: <6 U/mL   1      ENA SM Ab Ser-aCnc Latest Ref Range: <1.0 NEG AI      <1.0 NEG    Results for ISSIS, LINDSETH (MRN [id number]) as of 04/03/2020 13:38   Ref. Range 08/27/2010 11:32 10/12/2010 09:33 05/12/2015 09:01 04/02/2020 18:57  Sed Rate Latest Ref Range: 0 - 22 mm/hr 16 16 10 8       OV 06/06/2020  Subjective:  Patient ID: OLAM PARAS SHERRINE, female , DOB: [birth year] , age 4 y.o. , MRN: [id number] , ADDRESS: [address] [place] [address]   06/06/2020 -   Chief Complaint  Patient presents with   Follow-up    breathing is doing well with 2L. c/o non prod cough.    #Baseline interstitial lung disease in 2012 with progression in July 2021.  High-resolution CT chest pattern with alternative diagnosis suggestive of NSIP but significant mold exposure at workplace leading  up to July 2021 although no air-trapping.  Started empiric prednisone  mid July 2021.  Reluctant to have surgical lung biopsy or transbronchial biopsy of bronchoscopy lavage  HPI Levette Paulick Kann 63 y.o. -returns for follow-up.  Last seen in mid July 2021 in the hospital.  Since then she is seen nurse practitioner once towards  end of July 2021.  She reports overall improvement in symptoms but still has some dyspnea.  She says when she is on room air up and about in the house after 45 minutes to 1 hour her pulse ox nadir is around 88 to 92%.  She then uses oxygen  to give her some relief.  She does not realize that oxygen  is indicated if it is less than 88% only.  She says she is doing well with the prednisone  overall although she has gained weight and has developed features of moon face.  She continues to be hesitant about having any intervention to diagnose underlying ILD.  She is now off work and she is trying to pursue Social Security disability.  We did discuss the fact that if her condition improves she may not qualify for it.  She is not interested in [employer]. because she has good personal relationship with her previous Merchandiser, retail.  There is an invitation for her to work remotely and she is pondering that.  She does not have social short-term disability through her employer  In terms of vaccination:  She is going to have a flu shot in the month of October 2021 through primary care physician.  She has not had the Covid vaccine.  She is hesitant about the Covid vaccine because of fear of side effects and efficacy.  We discussed this extensively.  She is immunosuppressed because of prednisone .  She is also has interstitial lung disease and obesity and age over 67.   07/23/2020- APP visit Patient presents today for surgical clearance. She is planning cystoscopy/ureteroscopy and stent exchange on Monday November 8th with Dr. Rosina Kitchens [facility]. She has no acute respiratory complaints. Her breathing has stayed the same. She goes most days at home without needing to use oxygen . She may need to use 2L 1-2 times a week for a couple of hours. She monitors her levels and she rarely see O2 below 88% RA.  She has not been vaccinated for covid-19, unsure if she will get it. Not currently smoking, quit in 2012. Her weight remains  stable since September. She has some lower back and abdominal pain which she takes hydrocodone  at bedtime. She is following with ID and urology. Currenty on Levquin which she will complete 2 days after surgery. Denies acute symptoms of cough, chest tightness, wheezing. Afebrile.      OV 09/05/2020   Subjective:  Patient ID: Olam PARAS Zubiate, female , DOB: [birth year], age 67 y.o. years. , MRN: [id number],  ADDRESS: [address] [place] [address] PCP  Watt Mirza, MD Providers : Treatment Team:  Attending Provider: Geronimo Amel, MD Patient Care Team: Watt Mirza, MD as PCP - General (Family Medicine) Inocencio Soyla Lunger, MD as PCP - Electrophysiology (Cardiology) O'Neal, Darryle Ned, MD as PCP - Cardiology (Cardiology)  Type of visit: Telephone/Video Circumstance: COVID-19 national emergency Identification of patient Daniella Dewberry Mule with 06-01-1961 and MRN [id number] - 2 person identifier Risks: Risks, benefits, limitations of telephone visit explained. Patient understood and verbalized agreement to proceed Anyone else on call: none Patient location: her home This provider location: [address] in [place]  FU ILD.   #Baseline interstitial lung disease in 2012 with progression in July 2021.  High-resolution CT chest pattern with alternative diagnosis suggestive of NSIP but significant mold exposure at workplace leading up to July 2021 although no air-trapping.  Started empiric prednisone  mid July 2021.  Reluctant to have surgical lung biopsy or transbronchial biopsy of bronchoscopy lavage   HPI Tashera Montalvo Pangallo 63 y.o. -and this telephone visit she wanted to discuss about coming off prednisone .  Since I last saw her she ended up with bacterial sepsis.  She saw a nurse practitioner for urologic clearance.  She has a ureteral stent.  She is feeling fine now.  She does have some fatigue and myalgias post sepsis and post urologic procedure but otherwise is feeling good.   In terms of her shortness of breath.  She tells me that she is definitely worse than baseline compared to preadmission when I first met her.  She has dyspnea on exertion class II-3.  She uses oxygen  for subjective relief but does not necessarily check her pulse ox if it goes below 88%.  She believes it goes below 88%.  She is on prednisone  now 5 mg Monday Wednesday Friday.  She wants to come off this.  She is here to have Covid vaccine.  She says she is scared about getting Covid vaccine because of her immune system.  I reviewed her latest high-resolution CT chest.  Her groundglass opacities have resolved but she now has chronicity.  This means her CT scan is better than earlier this year when she was in flareup but worse than 2012 when she originally was found to have ILD.  And otherwise she has progressive phenotype of non-- IPF ILD   CT Chest data 09/01/20  Narrative & Impression  CLINICAL DATA:  Follow-up interstitial lung disease, on steroid therapy. Dyspnea with exertion with intermittent oxygen  use.   EXAM: CT CHEST WITHOUT CONTRAST   TECHNIQUE: Multidetector CT imaging of the chest was performed following the standard protocol without intravenous contrast. High resolution imaging of the lungs, as well as inspiratory and expiratory imaging, was performed.   COMPARISON:  04/02/2020 chest CT angiogram.   FINDINGS: Cardiovascular: Normal heart size. No significant pericardial effusion/thickening. Two lead left subclavian pacemaker with lead tips in the right atrium and right ventricular apex. Atherosclerotic nonaneurysmal thoracic aorta. Normal caliber pulmonary arteries.   Mediastinum/Nodes: No discrete thyroid  nodules. Unremarkable esophagus. No pathologically enlarged axillary, mediastinal or hilar lymph nodes, noting limited sensitivity for the detection of hilar adenopathy on this noncontrast study.   Lungs/Pleura: No pneumothorax. No pleural effusion. No  acute consolidative airspace disease or lung masses. Few scattered small solid right pulmonary nodules, largest 4 mm along the minor fissure (series 13/image 69), all stable and considered benign. No new significant pulmonary nodules. No significant lobular air trapping or evidence of tracheobronchomalacia on the expiration sequence. Mild centrilobular and paraseptal emphysema. Patchy mild-to-moderate peripheral ground-glass opacity and reticulation throughout both lungs with associated minimal architectural distortion. No significant regions traction bronchiectasis. No frank honeycombing. These findings (particularly the previously described peripheral reticulation and perilobular lines) appear mildly improved since 04/03/2020 chest CT. The ground-glass opacities are decreased and the reticulation and architectural distortion are increased since remote 2012 chest CT. Slight upper lung predominance to these findings.   Upper abdomen: Coarse posterior left liver calcification, unchanged.   Musculoskeletal: No aggressive appearing focal osseous lesions. Moderate thoracic spondylosis.   IMPRESSION: 1. Spectrum of findings compatible with fibrotic interstitial lung disease  without frank honeycombing, with a slight upper lung predominance. Findings have progressed since remote 2012 chest CT, and appear mildly improved in the interval since 04/03/2020 chest CT. Differential considerations include chronic hypersensitivity pneumonitis or fibrotic NSIP/postinflammatory fibrosis. Findings are suggestive of an alternative diagnosis (not UIP) per consensus guidelines: Diagnosis of Idiopathic Pulmonary Fibrosis: An Official ATS/ERS/JRS/ALAT Clinical Practice Guideline. Am JINNY Honey Crit Care Med Vol 198, Iss 5, [phone], May 21 2017. 2. Aortic Atherosclerosis (ICD10-I70.0) and Emphysema (ICD10-J43.9).     Electronically Signed   By: Selinda DELENA Blue M.D.   On: 09/01/2020 11:04    OV  01/01/2021  Subjective:  Patient ID: Olam JINNY Southerly, female , DOB: 04/20/1961 , age 73 y.o. , MRN: [id number] , ADDRESS: [address] [place] [address] PCP Watt Mirza, MD Patient Care Team: Watt Mirza, MD as PCP - General (Family Medicine) Inocencio Soyla Lunger, MD as PCP - Electrophysiology (Cardiology) O'Neal, Darryle Ned, MD as PCP - Cardiology (Cardiology)  This Provider for this visit: Treatment Team:  Attending Provider: Geronimo Amel, MD    01/01/2021 -   Chief Complaint  Patient presents with   Follow-up    Doing better, still winded going up stairs    FU ILD.     L;ast hrct dec 2021  HPI Aneesha Holloran Takaki 63 y.o. -returns for follow-up with her husband for her ILD.  She tells me from a ILD standpoint dyspnea standpoint she is stable.  Does not use oxygen .  She is off prednisone  and she is happy about that.  However she says since her sepsis admission in October 2021 she has lot of fatigue.  She is mostly in the house.  She does not have the motivation to go out.  She also has leg diffuse arthralgia.  This is adding to her shortness of breath.  She is wondering the rationale for this.  Back in July her ESR was normal and her TSH was normal in January 2022.  Current pulmonary function test shows stability.  ILD symptom score for dyspnea shows improvement but fatigue still persists.  Walking desaturation test is stable.  All documented below she is off prednisone .     OV 12/15/22 at [facility] with DR Tamea craven is a 63 year old former smoker (quit 2012, 15-pack-year history) who presents for follow-up of predominant interstitial lung disease and fatigue with shortness of breath.  This is a scheduled visit.  She was last seen here on 19 August 2022.  At that time we ordered PFTs due to her persistent complaint of dyspnea.  She did not get this done.  This will have to be rescheduled.  She is very challenging to manage as she will not follow  recommendations bided to her.  She has been erratic and using her oxygen  at nighttime and she does have severe sleep apnea for which she will not wear or even attempt to wear CPAP and associated sleep-related hypoxia/hypoventilation.  It is recommended that she undergo a formal titration study however she continues to refuse this.  She does not meet criteria for inspire device and she declined ENT referral for evaluation. I have advised her that untreated sleep apnea will be exceedingly detrimental to her.   She did have COVID-07 September 2022.  Continues to have dyspnea on exertion and fatigue.  Notes occasional wheezing.  No cough.  She states she is compliant with oxygen  at 2 L/min nocturnally.  She has had no orthopnea or paroxysmal nocturnal dyspnea.  No lower  extremity edema.  Calf tenderness.    OV 02/17/2023  Subjective:  Patient ID: Olam PARAS Liptak, female , DOB: 27-Oct-1960 , age 43 y.o. , MRN: [id number] , ADDRESS: [address] [place] [address] PCP Watt Mirza, MD Patient Care Team: Watt Mirza, MD as PCP - General (Family Medicine) Inocencio Soyla Lunger, MD as PCP - Electrophysiology (Cardiology) O'Neal, Darryle Ned, MD as PCP - Cardiology (Cardiology)  This Provider for this visit: Treatment Team:  Attending Provider: Geronimo Amel, MD    02/17/2023 -   Chief Complaint  Patient presents with   Follow-up    Switching providers, ILD     HPI Derica Leiber Dingley 63 y.o. -presents for follow-up.  Is been a few years since I saw her.  She has been following with Dr. Dedra Sanders and [facility].  She now wants to switch back.  She has sleep apnea.  Is here with her husband who is an independent historian.  She tells me that she is beginning to feel worse.  Some 6 or 12 months ago she could walk a full flight of 16 steps without stopping but only gets winded at the top.  Currently for the last 3 to 4 months she has been stopping midway  to catch her breath.  She feels overall she is declining.  [place]vee when we did access hypoxemia sit/stand test.  She did not desaturate.  Infectious tachycardic at baseline and this got slightly worse with exertion.  She feels is not able to get a full breath of her when she completes steps.  She did have a CT scan of the chest in spring 2023 as follow-up to her original acute ILD hypersensitive pneumonitis presentation.  This showed improvement.  She had pulmonary function test with us .  21st 2024.  This also shows further continued improvement.  Therefore she is surprised.  She does have some atypical chest pains.  She got treated for maxillary sinusitis to the end of April 2024 by primary care.  Then on Jan 27, 2023 she was referred to ENT.  She says she was diagnosed with a maxillary sinus bone spur given prednisone  which really helped her.  The current CT scan is done at least a week or 2 after she completed the prednisone .  She is known to have complete heart block and is on a pacer.  But today she is tachycardic at rest.  December 2023 twelve-lead EKG did not show evidence of pacing.   OV 08/23/2023  Subjective:  Patient ID: Olam PARAS Birnbaum, female , DOB: 1961/04/12 , age 12 y.o. , MRN: [id number] , ADDRESS: [address] [place] [address] PCP Watt Mirza, MD Patient Care Team: Watt Mirza, MD as PCP - General (Family Medicine) Inocencio Soyla Lunger, MD as PCP - Electrophysiology (Cardiology) O'Neal, Darryle Ned, MD as PCP - Cardiology (Cardiology)  This Provider for this visit: Treatment Team:  Attending Provider: Geronimo Amel, MD    08/23/2023 -   Chief Complaint  Patient presents with   Follow-up    Still having some SOB when going upstairs , has gotten a little better      HPI Mack Thurmon Schlick 63 y.o. -returns for follow-up.  Presents with her husband.  Since her last visit she says she is continues to do stable.  She did see Dr. Inocencio for her  coronary artery calcification electrical issues.  After her last visit she did have an echocardiogram 03/17/2023.  Results reviewed ejection  fraction with no normal.  She also had a high-resolution CT chest that shows stable ILD since early 2023.  This visit she had pulmonary function test that also shows continued stability in the ILD.  Her symptom score is stable her exercise hypoxemia test is also stable [see below].  She has not had a flu shot and she will have a flu shot today.  Most recent chemistry panel was March 2024 with a normal creatinine and a normal hemoglobin.      OV 06/29/2024  Subjective:  Patient ID: Olam PARAS Tetrick, female , DOB: September 12, 1961 , age 45 y.o. , MRN: [id number] , ADDRESS: [address] [place] [address] PCP Watt Mirza, MD Patient Care Team: Watt Mirza, MD as PCP - General (Family Medicine) Inocencio Soyla Lunger, MD as PCP - Electrophysiology (Cardiology) O'Neal, Darryle Ned, MD as PCP - Cardiology (Cardiology)  This Provider for this visit: Treatment Team:  Attending Provider: Geronimo Amel, MD  #Baseline interstitial lung disease in 2012 with progression in July 2021.   - High-resolution CT chest pattern with alternative diagnosis suggestive of NSIP but significant mold exposure at workplace leading up to July 2021 although no air-trapping.   - Started empiric prednisone  mid July 2021. - Off prednisone  end Jan 2022 -  Reluctant to have surgical lung biopsy or transbronchial biopsy of bronchoscopy lavage.   #History of complete heart block status post [facility] pacemaker June 2017.  Follows with Dr. Inocencio.  Last EKG 2023 without any pacing  #Severe sleep apnea -has followed with Dr. Dedra Sanders at [facility].  Does not use CPAP or BiPAP or inspire.  #Acute right-sided maxillary sinusitis treated with Augmentin  01/17/2023.  By Mirza Copeland.->  Refer to ENT 01/27/2023.   06/29/2024 -   Chief Complaint  Patient  presents with   Interstitial Lung Disease    Pt stated since last visit breathing has been the same SOB exertion ( going up stairs, walking around, depending on what activity pt is doing) Pt stated o2 stays between (93-95) at home  Pt will get flu shot today, Pt stated she was unable to do sit and stand due to issues she is having     HPI Rhianna Raulerson Rutigliano 63 y.o. -turns for follow-up.  Last seen end of last year.  She says since early February 2025 she is having a lot of GI issues of abdominal pain pelvic pain type pain and also diarrhea and dark stool.  She saw the physician assistant Ellouise Console at [facility] on 06/27/2024.  It took this long to get an appointment.  Upper endoscopy and lower colonoscopy planned and they want a preop clearance.  I personally reviewed the external medical record.  Her husband who is here with and an independent historian affirms the same  In terms of symptoms she feels stable.  Symptom scores are below.  Exercise is stable.  Pulmonary function test also shows long-term stability.  She did have a CT scan of the abdomen lung image earlier this year and of the lower part of the lungs there is no evidence of ILD.  All her ILD is in the upper part of lung and this was visualized.  We did discuss the fact if the ILD and the GI symptoms could be related.  I affirmed it was highly unlikely.  However in the past rheumatoid factor was slightly elevated therefore we will recheck this.  She is agreeable to this  She wants to have a flu  shot today.     SYMPTOM SCALE - ILD 04/04/2020   06/06/2020  01/01/2021  02/17/2023  08/23/2023  06/29/2024   O2 use RA - 87% in hospital Uses o2 at home but below walk in on RA RA at home. Does not use 02 ra  ra  Shortness of Breath 0 -> 5 scale with 5 being worst (score 6 If unable to do)       At rest 2 - 0 1 0 0  Simple tasks - showers, clothes change, eating, shaving 2 3 0 1 0 2  Household (dishes, doing bed, laundry) 5 3 3 2  0 3   Shopping 5 x x 3 2 3   Walking level at own pace 4 2 0 2 0 2  Walking up Stairs 5 3.5 5 4.5 4 4   Total (30-36) Dyspnea Score 23 11.5 8 13.5 6 14   How bad is your cough? x 1 1 0 2 0-3  How bad is your fatigue x 4 4 3 3 5   How bad is nausea x 0 0 1 0 0  How bad is vomiting?   x 0 0 0 0 0  How bad is diarrhea? x 2 0 0 0 5  How bad is anxiety? x 3 4 4.5 5 5   How bad is depression x 1 0 0 0 4      Simple office walk 185 feet x  3 laps goal with forehead probe 06/06/2020  01/01/2021  02/17/2023  08/23/2023  06/29/2024   O2 used ra ra ra ra ras  Number laps completed 3 3 Sit stand x 15 Sit stand x 15 3 laps   Comments about pace normal avg good    Resting Pulse Ox/HR 98% and 101/min 99% and 95/min 98% and ?HR 96 98% with a heart rate of 71 96% and hr 62  Final Pulse Ox/HR 95% and 118/min 97% and 100/min 97% and HRR 115 96% with a heart rate of 97 95% ad HR 76  Desaturated </= 88% x x     Desaturated <= 3% points c=x x     Got Tachycardic >/= 90/min x yes     Symptoms at end of test x Some dyspnea on last lap Mild 2 of 10 dyspna Level 3 out of 10 dyspnea No dyspnea  Miscellaneous comments x x        PFT     Latest Ref Rng & Units 04/24/2024    9:45 AM 07/14/2023    9:32 AM 02/07/2023    3:07 PM 09/01/2020   11:15 AM 04/04/2020   12:46 PM  PFT Results  FVC-Pre L 2.72  2.71  2.76   2.25   FVC-Predicted Pre % 86  85  86  78  69   FVC-Post L 2.82   2.62     FVC-Predicted Post % 89   82     Pre FEV1/FVC % % 79  84  82  82  85   Post FEV1/FCV % % 80   85     FEV1-Pre L 2.15  2.26  2.26  2.08  1.92   FEV1-Predicted Pre % 88  91  91  81  75   FEV1-Post L 2.25   2.22     DLCO uncorrected ml/min/mmHg 16.84  15.38  20.27  15.24  14.04   DLCO UNC% % 87  78  103  77  71   DLCO corrected ml/min/mmHg   20.27  13.23   DLCO COR %Predicted %   103   66   DLVA Predicted % 98  90  120  86  97   TLC L 4.89   4.49     TLC % Predicted % 99   91     RV % Predicted % 109   96          LAB  RESULTS last 96 hours No results found.       has a past medical history of Chronic systolic CHF (congestive heart failure) (HCC) (07/15/2020), Complete heart block (HCC) (02/26/2016), Family history of ovarian cancer, Generalized anxiety disorder (07/15/2020), GERD (gastroesophageal reflux disease), History of kidney stones, Hyperlipidemia, Hypertension, Interstitial lung disease (HCC), Presence of permanent cardiac pacemaker, Pulmonary fibrosis (HCC), Squamous cell carcinoma (2014), and Subclinical hyperthyroidism (06/17/2014).   reports that she quit smoking about 13 years ago. Her smoking use included cigarettes. She started smoking about 43 years ago. She has a 15 pack-year smoking history. She has never used smokeless tobacco.  Past Surgical History:  Procedure Laterality Date   CESAREAN SECTION     CYSTOSCOPY W/ URETERAL STENT PLACEMENT Left 07/15/2020   Procedure: CYSTOSCOPY WITH RETROGRADE PYELOGRAM/URETERAL STENT PLACEMENT;  Surgeon: Twylla Glendia BROCKS, MD;  Location: [facility] ORS;  Service: Urology;  Laterality: Left;   CYSTOSCOPY/URETEROSCOPY/HOLMIUM LASER/STENT PLACEMENT Left 07/28/2020   Procedure: CYSTOSCOPY/URETEROSCOPY/HOLMIUM LASER/STENT Exchange;  Surgeon: Penne Knee, MD;  Location: [facility] ORS;  Service: Urology;  Laterality: Left;   EP IMPLANTABLE DEVICE N/A 02/26/2016   Procedure: Pacemaker Implant;  Surgeon: Will Gladis Norton, MD;  Location: [facility]AS[facility];  Service: Cardiovascular;  Laterality: N/A;   MOHS SURGERY     skin cancer face   TUBAL LIGATION      Allergies  Allergen Reactions   Codeine Hives and Itching   Sulfonamide Derivatives Hives   Paroxetine Other (See Comments)    unknown   Iodinated Contrast Media Rash   Lisinopril  Rash   Losartan  Potassium Other (See Comments)    Leg felt heavy   Neomycin-Polymyxin-Dexameth Itching, Swelling and Other (See Comments)    Ey drop/Swelling and itching of eye    Oxycodone -Acetaminophen  Other (See Comments)     Mild SOB, feels funny   Sulfa Antibiotics Hives and Rash         Immunization History  Administered Date(s) Administered   Influenza Nasal 06/20/2017   Influenza Whole 07/04/2008, 06/18/2009, 08/27/2010   Influenza, Seasonal, Injecte, Preservative Fre 09/03/2010, 06/22/2011, 06/27/2012   Influenza,inj,Quad PF,6+ Mos 07/21/2015, 07/06/2018, 08/10/2021, 08/23/2023   Influenza-Unspecified 06/27/2012, 07/17/2014, 07/19/2016, 07/21/2019, 07/17/2022   PNEUMOCOCCAL CONJUGATE-20 04/02/2024   Td 05/10/2002   Tdap 10/28/2014   Zoster Recombinant(Shingrix ) 12/08/2022, 02/08/2023    Family History  Problem Relation Age of Onset   Anxiety disorder Mother    Hypertension Mother    Ovarian cancer Mother 65   Prostate cancer Father    Cancer Maternal Aunt        breast   Heart disease Maternal Grandfather    Heart failure Maternal Grandfather    Colon cancer Neg Hx    Colon polyps Neg Hx    Esophageal cancer Neg Hx    Pancreatic cancer Neg Hx    Stomach cancer Neg Hx      Current Outpatient Medications:    carvedilol  (COREG ) 25 MG tablet, Take 1 tablet (25 mg total) by mouth 2 (two) times daily with a meal., Disp: 60 tablet, Rfl: 1   Cholecalciferol  (VITAMIN D3 SUPER  STRENGTH) 50 MCG (2000 UT) TABS, Take 1 tablet by mouth daily., Disp: , Rfl:    Cranberry 450 MG TABS, Take 2 tablets by mouth daily., Disp: , Rfl:    Cyanocobalamin (VITAMIN B-12) 3000 MCG SUBL, Place 2 tablets under the tongue daily., Disp: , Rfl:    diazepam  (VALIUM ) 5 MG tablet, TAKE 1 TABLET BY MOUTH EVERY 8 HOURS FOR ANXIETY, Disp: 30 tablet, Rfl: 3   escitalopram  (LEXAPRO ) 5 MG tablet, Take 1 tablet (5 mg total) by mouth daily., Disp: 30 tablet, Rfl: 3   famotidine  (PEPCID ) 20 MG tablet, Take 1 tablet (20 mg total) by mouth daily., Disp: 30 tablet, Rfl: 0   fluticasone  (FLONASE ) 50 MCG/ACT nasal spray, Place 2 sprays into both nostrils daily., Disp: 48 g, Rfl: 3   Magnesium  Citrate 200 MG TABS, Take 2 tablets by  mouth daily., Disp: , Rfl:    spironolactone  (ALDACTONE ) 25 MG tablet, TAKE 1 TABLET BY MOUTH DAILY, Disp: 90 tablet, Rfl: 3   vitamin C  (ASCORBIC ACID ) 250 MG tablet, Take 1,000 mg by mouth daily., Disp: , Rfl:    acetaminophen  (TYLENOL ) 500 MG tablet, Take 1,000 mg by mouth every 6 (six) hours as needed. As needed (Patient not taking: Reported on 06/29/2024), Disp: , Rfl:  No current facility-administered medications for this visit.  Facility-Administered Medications Ordered in Other Visits:    albuterol  (PROVENTIL ) (2.5 MG/3ML) 0.083% nebulizer solution 2.5 mg, 2.5 mg, Nebulization, Once, Geronimo Amel, MD      Objective:   Vitals:   06/29/24 0820  BP: 124/72  Pulse: 60  Temp: 98.9 F (37.2 C)  TempSrc: Oral  SpO2: 96%  Weight: 196 lb 3.2 oz (89 kg)  Height: 5' 2.5 (1.588 m)    Estimated body mass index is 35.31 kg/m as calculated from the following:   Height as of this encounter: 5' 2.5 (1.588 m).   Weight as of this encounter: 196 lb 3.2 oz (89 kg).  @WEIGHTCHANGE @  [employer]   06/29/24 0820  Weight: 196 lb 3.2 oz (89 kg)     Physical Exam   General: No distress. Looks well O2 at rest: no Cane present: no Sitting in wheel chair: no Frail: no Obese: no Neuro: Alert and Oriented x 3. GCS 15. Speech normal Psych: Pleasant Resp:  Barrel Chest - no.  Wheeze - no, Crackles - no, No overt respiratory distress CVS: Normal heart sounds. Murmurs - no Ext: Stigmata of Connective Tissue Disease - no HEENT: Normal upper airway. PEERL +. No post nasal drip        Assessment/     Assessment & Plan Interstitial lung disease (HCC)  Preop respiratory exam  Paraseptal emphysema (HCC)  Flu vaccine need     1) RISK FOR PROLONGED MECHANICAL VENTILAION - > 48h  1A) Arozullah - Prolonged mech ventilation risk Arozullah Postperative Pulmonary Risk Score - for mech ventilation dependence >48h USAA, Ann Surg 2000, major non-cardiac surgery)  Comment Score  Type of surgery - abd ao aneurysm (27), thoracic (21), neurosurgery / upper abdominal / vascular (21), neck (11) endoscopy 0  Emergency Surgery - (11)  0  ALbumin < 3 or poor nutritional state - (9)  0  BUN > 30 -  (8)  0  Partial or completely dependent functional status - (7)  0  COPD -  (6)  6  Age - 60 to 69 (4), > 70  (6)  4  TOTAL  10  Risk Stratifcation scores  - <  10 (0.5%), 11-19 (1.8%), 20-27 (4.2%), 28-40 (10.1%), >40 (26.6%)  Low risk     PLAN Patient Instructions     ICD-10-CM   1. Preop respiratory exam  Z01.811     2. Interstitial lung disease (HCC)  J84.9     3. Paraseptal emphysema (HCC)  J43.8     4. Flu vaccine need  Z23         #Preoperative Pulmonary Eval for Endoscopy and COlonoscopy  Low risk for prolonged mechanical ventilator dependence after procedure  Plan  - prefer procedure in hospital setting to be on safe side   #Interstitial lung disease  -Clinical stable over time; based on CT scan chest 2024 pulmonary function test Aug 2025 - needs ongoing monitoring  Plan - Do spirometry and DLCO in 5 months -recheck Quanti gold, ESR, RF, ANA and CCP  #Emphysema  -Clinically stable  Plan - Continue Spiriva  as before  #Vaccine counseling  Plan - Flu shot today 06/29/2024    Followup  -Return to see Dr. Geronimo for a 15-minute visit in 5 months after pulmonary function testing    FOLLOWUP    Return in about 5 months (around 11/27/2024) for Dr. Noel 15-min visit in 5 months; after pulmonary function testing, 15 min visit,.    SIGNATURE    Dr. Dorethia Geronimo, M.D., F.C.C.P,  Pulmonary and Critical Care Medicine Staff Physician, [facility] Director - Interstitial Lung Disease  Program  Pulmonary Fibrosis [facility] at [facility] [place], [place], [address]  Pager: [phone], If no answer or between  15:00h - 7:00h: call [phone] Telephone: [phone]  9:06 AM 06/29/2024

## 2024-07-01 NOTE — Progress Notes (Unsigned)
     Anne Bernardini T. Yehonatan Grandison, MD, CAQ Sports Medicine [facility] at [facility] [address] [place], [address]  Phone: [phone]  FAX: [phone]  Anne Rush - 63 y.o. female  MRN [id number]  Date of Birth: 05-27-1961  Date: 07/04/2024  PCP: Watt Mirza, MD  Referral: Watt Mirza, MD  No chief complaint on file.  Subjective:   Anne Rush is a 63 y.o. very pleasant female patient with There is no height or weight on file to calculate BMI. who presents with the following:  Discussed the use of AI scribe software for clinical note transcription with the patient, who gave verbal consent to proceed.  Follow-up anxiety and depression.  The last time I saw her, she was extremely stressed and had some worsening over both her anxiety and depression.  There was a lot of psychosocial stress and challenges with her mother.  At that time, I have started her on Lexapro  on April 28, 2024.  She also has been taking Valium  regularly for an extended period of time   History of Present Illness     Review of Systems is noted in the HPI, as appropriate  Objective:   There were no vitals taken for this visit.  GEN: No acute distress; alert,appropriate. PULM: Breathing comfortably in no respiratory distress PSYCH: Normally interactive.   Laboratory and Imaging Data:  Assessment and Plan:   No diagnosis found. Assessment & Plan   Medication Management during today's office visit: No orders of the defined types were placed in this encounter.  There are no discontinued medications.  Orders placed today for conditions managed today: No orders of the defined types were placed in this encounter.   Disposition: No follow-ups on file.  [employer] speech-to-text software was used for transcription in this dictation.  Possible transcriptional errors can occur using Animal nutritionist.   Signed,  Anne Rush. Anne Whittenberg, MD   Outpatient  Encounter Medications as of 07/04/2024  Medication Sig   acetaminophen  (TYLENOL ) 500 MG tablet Take 1,000 mg by mouth every 6 (six) hours as needed. As needed (Patient not taking: Reported on 06/29/2024)   carvedilol  (COREG ) 25 MG tablet Take 1 tablet (25 mg total) by mouth 2 (two) times daily with a meal.   Cholecalciferol  (VITAMIN D3 SUPER STRENGTH) 50 MCG (2000 UT) TABS Take 1 tablet by mouth daily.   Cranberry 450 MG TABS Take 2 tablets by mouth daily.   Cyanocobalamin (VITAMIN B-12) 3000 MCG SUBL Place 2 tablets under the tongue daily.   diazepam  (VALIUM ) 5 MG tablet TAKE 1 TABLET BY MOUTH EVERY 8 HOURS FOR ANXIETY   escitalopram  (LEXAPRO ) 5 MG tablet Take 1 tablet (5 mg total) by mouth daily.   famotidine  (PEPCID ) 20 MG tablet Take 1 tablet (20 mg total) by mouth daily.   fluticasone  (FLONASE ) 50 MCG/ACT nasal spray Place 2 sprays into both nostrils daily.   Magnesium  Citrate 200 MG TABS Take 2 tablets by mouth daily.   spironolactone  (ALDACTONE ) 25 MG tablet TAKE 1 TABLET BY MOUTH DAILY   vitamin C  (ASCORBIC ACID ) 250 MG tablet Take 1,000 mg by mouth daily.   Facility-Administered Encounter Medications as of 07/04/2024  Medication   albuterol  (PROVENTIL ) (2.5 MG/3ML) 0.083% nebulizer solution 2.5 mg

## 2024-07-03 LAB — QUANTIFERON-TB GOLD PLUS
Mitogen-NIL: 7.65 [IU]/mL
NIL: 0.01 [IU]/mL
QuantiFERON-TB Gold Plus: NEGATIVE
TB1-NIL: 0 [IU]/mL
TB2-NIL: 0 [IU]/mL

## 2024-07-03 LAB — ANTI-NUCLEAR AB-TITER (ANA TITER): ANA Titer 1: 1:80 {titer} — ABNORMAL HIGH

## 2024-07-03 LAB — RHEUMATOID FACTOR: Rheumatoid fact SerPl-aCnc: 11 [IU]/mL (ref <14)

## 2024-07-03 LAB — ANA: Anti Nuclear Antibody (ANA): POSITIVE — AB

## 2024-07-03 LAB — CYCLIC CITRUL PEPTIDE ANTIBODY, IGG: Cyclic Citrullin Peptide Ab: <16 U

## 2024-07-04 ENCOUNTER — Encounter: Payer: Self-pay | Admitting: Family Medicine

## 2024-07-04 ENCOUNTER — Ambulatory Visit (INDEPENDENT_AMBULATORY_CARE_PROVIDER_SITE_OTHER): Admitting: Family Medicine

## 2024-07-04 VITALS — BP 132/90 | HR 67 | Temp 97.8°F | Ht 62.25 in | Wt 197.1 lb

## 2024-07-04 DIAGNOSIS — F411 Generalized anxiety disorder: Secondary | ICD-10-CM

## 2024-07-04 DIAGNOSIS — F330 Major depressive disorder, recurrent, mild: Secondary | ICD-10-CM | POA: Diagnosis not present

## 2024-07-04 MED ORDER — ESCITALOPRAM OXALATE 10 MG PO TABS: 10.0000 mg | ORAL_TABLET | Freq: Every day | ORAL | 3 refills | Status: DC

## 2024-07-05 NOTE — Progress Notes (Signed)
  Electrophysiology Office Note:   Date:  07/06/2024  ID:  Anne Rush, DOB 08/26/1961, MRN [id number]  Primary Cardiologist: Darryle ONEIDA Decent, MD Primary Heart Failure: None Electrophysiologist: Lisamarie Coke Gladis Norton, MD      History of Present Illness:   Anne Rush is a 63 y.o. female with h/o complete heart block seen today for routine electrophysiology followup.   Since last being seen in our clinic the patient reports doing overall well from a cardiac perspective.  She has no acute complaints.  She does need an endoscopy and colonoscopy.  There is concern over an abnormal stool study.  She is also 8 months from generator change..  she denies chest pain, palpitations, dyspnea, PND, orthopnea, nausea, vomiting, dizziness, syncope, edema, weight gain, or early satiety.   Review of systems complete and found to be negative unless listed in HPI.      EP Information / Studies Reviewed:    EKG is ordered today. Personal review as below.  EKG Interpretation Date/Time:  Friday July 06 2024 08:40:41 EDT Ventricular Rate:  65 PR Interval:  208 QRS Duration:  166 QT Interval:  438 QTC Calculation: 455 R Axis:   262  Text Interpretation: Atrial-sensed ventricular-paced rhythm When compared with ECG of 06-Apr-2023 08:32, Vent. rate has decreased BY   7 BPM Confirmed by Brieonna Crutcher ([id number]) on 07/06/2024 8:54:50 AM   PPM Interrogation-  reviewed in detail today,  See PACEART report.  Device History: Abbott Dual Chamber PPM implanted 02/26/2016 for CHB  Risk Assessment/Calculations:             Physical Exam:   VS:  BP 114/76 (BP Location: Left Arm, Patient Position: Sitting, Cuff Size: Large)   Pulse 65   Ht 5' 2.25 (1.581 m)   Wt 194 lb (88 kg)   SpO2 95%   BMI 35.20 kg/m    Wt Readings from Last 3 Encounters:  07/06/24 194 lb (88 kg)  07/04/24 197 lb 2 oz (89.4 kg)  06/29/24 196 lb 3.2 oz (89 kg)     GEN: Well nourished, well developed in no acute distress NECK:  No JVD; No carotid bruits CARDIAC: Regular rate and rhythm, no murmurs, rubs, gallops RESPIRATORY:  Clear to auscultation without rales, wheezing or rhonchi  ABDOMEN: Soft, non-tender, non-distended EXTREMITIES:  No edema; No deformity   ASSESSMENT AND PLAN:    CHB s/p Abbott PPM  Normal PPM function See Pace Art report No changes today She is 8 months from generator change.  We discussed risks and benefits.  She understands the risks and has agreed to the procedure.  Explained risks, benefits, and alternatives to generator change, including but not limited to bleeding and infection. Pt verbalized understanding and agrees to proceed.  2.  Hypertension: Well-controlled  3.  Chronic systolic heart failure: On carvedilol .  normal ejection fraction on most recent echo.  Continue current management.  4.  Obstructive sleep apnea: Followed by pulmonary:  5.  Coronary disease: Found on chest CT.  Plan per primary cardiology.  6.  Preoperative evaluation: Has upcoming colonoscopy and endoscopy.  She is feeling well and has no cardiac complaints.  No further cardiac testing is necessary.  She would be at low to intermediate risk for this intermediate risk procedure.  Disposition:   Follow up with Dr. Norton in 12 months  Signed, Jameica Couts Gladis Norton, MD

## 2024-07-06 ENCOUNTER — Ambulatory Visit: Attending: Cardiology | Admitting: Cardiology

## 2024-07-06 ENCOUNTER — Encounter: Payer: Self-pay | Admitting: Cardiology

## 2024-07-06 VITALS — BP 114/76 | HR 65 | Ht 62.25 in | Wt 194.0 lb

## 2024-07-06 DIAGNOSIS — I5022 Chronic systolic (congestive) heart failure: Secondary | ICD-10-CM

## 2024-07-06 DIAGNOSIS — G4733 Obstructive sleep apnea (adult) (pediatric): Secondary | ICD-10-CM

## 2024-07-06 DIAGNOSIS — I2510 Atherosclerotic heart disease of native coronary artery without angina pectoris: Secondary | ICD-10-CM

## 2024-07-06 DIAGNOSIS — I10 Essential (primary) hypertension: Secondary | ICD-10-CM | POA: Diagnosis not present

## 2024-07-06 DIAGNOSIS — I442 Atrioventricular block, complete: Secondary | ICD-10-CM | POA: Diagnosis not present

## 2024-07-06 LAB — CUP PACEART INCLINIC DEVICE CHECK
Battery Remaining Longevity: 8 mo
Battery Voltage: 2.75 V
Brady Statistic RA Percent Paced: 3.9 %
Brady Statistic RV Percent Paced: 99.99 %
Date Time Interrogation Session: 20251017093433
Implantable Lead Connection Status: 753985
Implantable Lead Connection Status: 753985
Implantable Lead Implant Date: 20170608
Implantable Lead Implant Date: 20170608
Implantable Lead Location: 753859
Implantable Lead Location: 753860
Implantable Pulse Generator Implant Date: 20170608
Lead Channel Impedance Value: 437.5 Ohm
Lead Channel Impedance Value: 550 Ohm
Lead Channel Pacing Threshold Amplitude: 0.75 V
Lead Channel Pacing Threshold Amplitude: 0.75 V
Lead Channel Pacing Threshold Amplitude: 1.25 V
Lead Channel Pacing Threshold Pulse Width: 0.4 ms
Lead Channel Pacing Threshold Pulse Width: 0.4 ms
Lead Channel Pacing Threshold Pulse Width: 0.4 ms
Lead Channel Sensing Intrinsic Amplitude: 3.6 mV
Lead Channel Setting Pacing Amplitude: 1.5 V
Lead Channel Setting Pacing Amplitude: 2 V
Lead Channel Setting Pacing Pulse Width: 0.4 ms
Lead Channel Setting Sensing Sensitivity: 2.5 mV
Pulse Gen Model: 2272
Pulse Gen Serial Number: 7903881

## 2024-07-06 NOTE — Patient Instructions (Signed)
 Medication Instructions:  Your physician recommends that you continue on your current medications as directed. Please refer to the Current Medication list given to you today.  *If you need a refill on your cardiac medications before your next appointment, please call your pharmacy*  Testing/Procedures: Pacemaker Generator Change - we will call you when it is time to schedule.  Follow-Up: At [facility], you and your health needs are our priority.  As part of our continuing mission to provide you with exceptional heart care, our providers are all part of one team.  This team includes your primary Cardiologist (physician) and Advanced Practice Providers or APPs (Physician Assistants and Nurse Practitioners) who all work together to provide you with the care you need, when you need it.

## 2024-07-09 DIAGNOSIS — J9601 Acute respiratory failure with hypoxia: Secondary | ICD-10-CM | POA: Diagnosis not present

## 2024-07-09 DIAGNOSIS — I5189 Other ill-defined heart diseases: Secondary | ICD-10-CM | POA: Diagnosis not present

## 2024-07-13 ENCOUNTER — Ambulatory Visit: Payer: Self-pay | Admitting: Cardiology

## 2024-07-18 ENCOUNTER — Telehealth: Payer: Self-pay | Admitting: Internal Medicine

## 2024-07-18 NOTE — Telephone Encounter (Signed)
 Inbound call from patient stating she is scheduled for a double procedure on 08/09/24 and has been experiencing some lower back pain that it hurts to breathe and in her pelvic area it feels like theres broken bones so its causing more pain. Patient would like to speak to nurse  Requesting a call back  Please advise  Thank you

## 2024-07-19 NOTE — Telephone Encounter (Signed)
 Spoke with pt and she is scheduled for ecl in 3 weeks. She states she has been having pain in her back on the right side and her pelvic bones hurt when she gets up in the am. She wanted to know if Dr. Albertus had any cancellations to see her. Discussed with pt that he did not but that some of her symptoms did not sound GI related. Suggested she may want to contact her PCP to be seen sooner. She verbalized understanding.

## 2024-07-22 NOTE — Progress Notes (Unsigned)
     Abbigael Detlefsen T. Holland Nickson, MD, CAQ Sports Medicine [facility] [address] [place], [address]  Phone: [phone]  FAX: [phone]  Skyra Crichlow Alcaide - 63 y.o. female  MRN [id number]  Date of Birth: October 08, 1960  Date: 07/23/2024  PCP: Watt Mirza, MD  Referral: Watt Mirza, MD  No chief complaint on file.  Subjective:   Avri Paiva Jablonski is a 63 y.o. very pleasant female patient with There is no height or weight on file to calculate BMI. who presents with the following:  Discussed the use of AI scribe software for clinical note transcription with the patient, who gave verbal consent to proceed.  Kelly is here for follow-up.  She has recently been feeling severe anxiety.  She has had issues with depression and anxiety for a very long time.  She has been undergoing psychosocial stress with family, and had some worsening of her recurrent depression and anxiety.  Previously started her on Lexapro  and recently increased the dose to 10 mg.  She has also been struggling with depression. History of Present Illness     Review of Systems is noted in the HPI, as appropriate  Objective:   There were no vitals taken for this visit.  GEN: No acute distress; alert,appropriate. PULM: Breathing comfortably in no respiratory distress PSYCH: Normally interactive.   Laboratory and Imaging Data:  Assessment and Plan:   No diagnosis found. Assessment & Plan   Medication Management during today's office visit: No orders of the defined types were placed in this encounter.  There are no discontinued medications.  Orders placed today for conditions managed today: No orders of the defined types were placed in this encounter.   Disposition: No follow-ups on file.  Dragon [employer] One speech-to-text software was used for transcription in this dictation.  Possible transcriptional errors can occur using Animal nutritionist.   Signed,  Mirza DASEN.  Oluchi Pucci, MD   Outpatient Encounter Medications as of 07/23/2024  Medication Sig   acetaminophen  (TYLENOL ) 500 MG tablet Take 1,000 mg by mouth every 6 (six) hours as needed. As needed   carvedilol  (COREG ) 25 MG tablet Take 1 tablet (25 mg total) by mouth 2 (two) times daily with a meal.   Cholecalciferol  (VITAMIN D3 SUPER STRENGTH) 50 MCG (2000 UT) TABS Take 1 tablet by mouth daily.   Cranberry 450 MG TABS Take 2 tablets by mouth daily.   Cyanocobalamin (VITAMIN B-12) 3000 MCG SUBL Place 2 tablets under the tongue daily.   diazepam  (VALIUM ) 5 MG tablet TAKE 1 TABLET BY MOUTH EVERY 8 HOURS FOR ANXIETY   escitalopram  (LEXAPRO ) 10 MG tablet Take 1 tablet (10 mg total) by mouth daily.   famotidine  (PEPCID ) 20 MG tablet Take 1 tablet (20 mg total) by mouth daily.   fluticasone  (FLONASE ) 50 MCG/ACT nasal spray Place 2 sprays into both nostrils daily.   Magnesium  Citrate 200 MG TABS Take 2 tablets by mouth daily.   spironolactone  (ALDACTONE ) 25 MG tablet TAKE 1 TABLET BY MOUTH DAILY   vitamin C  (ASCORBIC ACID ) 250 MG tablet Take 1,000 mg by mouth daily.   Facility-Administered Encounter Medications as of 07/23/2024  Medication   albuterol  (PROVENTIL ) (2.5 MG/3ML) 0.083% nebulizer solution 2.5 mg

## 2024-07-23 ENCOUNTER — Ambulatory Visit (INDEPENDENT_AMBULATORY_CARE_PROVIDER_SITE_OTHER): Admitting: Family Medicine

## 2024-07-23 ENCOUNTER — Encounter: Payer: Self-pay | Admitting: Family Medicine

## 2024-07-23 VITALS — BP 130/84 | HR 63 | Temp 97.9°F | Ht 62.25 in | Wt 197.2 lb

## 2024-07-23 DIAGNOSIS — M51379 Other intervertebral disc degeneration, lumbosacral region without mention of lumbar back pain or lower extremity pain: Secondary | ICD-10-CM | POA: Diagnosis not present

## 2024-07-23 DIAGNOSIS — F411 Generalized anxiety disorder: Secondary | ICD-10-CM

## 2024-07-23 DIAGNOSIS — R195 Other fecal abnormalities: Secondary | ICD-10-CM | POA: Diagnosis not present

## 2024-07-23 DIAGNOSIS — M545 Low back pain, unspecified: Secondary | ICD-10-CM | POA: Diagnosis not present

## 2024-07-23 DIAGNOSIS — K529 Noninfective gastroenteritis and colitis, unspecified: Secondary | ICD-10-CM

## 2024-07-23 DIAGNOSIS — F330 Major depressive disorder, recurrent, mild: Secondary | ICD-10-CM

## 2024-07-26 NOTE — Telephone Encounter (Signed)
 Pre-op evaluation performed at office visit with Dr. Inocencio on 10/17. Forwarded note to requesting office.   [facility]. Hollie Bartus, DNP, NP-C  07/26/2024, 4:32 PM [facility] [address] Office [phone] Fax [phone]

## 2024-07-27 ENCOUNTER — Other Ambulatory Visit: Payer: Self-pay | Admitting: Family Medicine

## 2024-07-29 ENCOUNTER — Other Ambulatory Visit: Payer: Self-pay | Admitting: Family Medicine

## 2024-07-30 ENCOUNTER — Telehealth: Payer: Self-pay | Admitting: Physician Assistant

## 2024-07-30 ENCOUNTER — Encounter: Payer: Self-pay | Admitting: Internal Medicine

## 2024-07-30 ENCOUNTER — Encounter: Payer: Self-pay | Admitting: Cardiology

## 2024-07-30 ENCOUNTER — Encounter: Payer: Self-pay | Admitting: Family Medicine

## 2024-07-30 NOTE — Telephone Encounter (Signed)
 FYI

## 2024-07-30 NOTE — Telephone Encounter (Signed)
 Inbound call from patient stating that she is very upset and believes that what was done to her is ethically unprofessional. Patient stated that she was scheduled to have a ENDO Colon in the [facility] on November the 20 th. Patient then got word from heavenly about patient medical clearance. Patient was advised that due to her medical clearance it was best to postpone her procedure and schedule it in the hospital. Patient does not agree to that due to her having to rearrange her whole entire schedule in order to do this procedure. Patient is also complaining that her symptoms are getting worse. Patient is requesting to speak to the office manager to get this issue resolved.

## 2024-07-30 NOTE — Telephone Encounter (Signed)
 Last office visit 07/23/24 for back injury.  Last refilled 04/02/24 for 330 with 3 refills.  Next Appt: No future appointments with PCP.

## 2024-07-30 NOTE — Telephone Encounter (Addendum)
 Patient has been notified that Dr. Geronimo put in his office note from 06/29/24 that the patient should have her procedure in the hospital.  I notified that patient that Alethea did not make that decision on her own.  Patient is notified that Dr. Albertus does not have any availability in the hospital before January at this point.  If she is willing to have her procedure with another provider, there is a possibility of having the procedure before the end of the year.  The patient would like to stay with Dr. Albertus.  I notified the patient that Alethea will call her in the next few days with a January date.

## 2024-07-31 NOTE — Telephone Encounter (Signed)
 Another [facility] provider may have earlier hospital outpt procedure time for her EGD/colonoscopy. If patient is open to one of my partners doing her cases we can explore that avenue. Thanks [employer]

## 2024-07-31 NOTE — Telephone Encounter (Signed)
 I was the one who write hospital setting to be on safer side as recommendation. But did write low risk  I was being ultra caustious due to her cardiac issues/ However if time is of concern with normal PFT Aug 2025 and normal walk test  I am ok with getting this donein outpaitnet endoscopy center ([place]ross [place]) if not possible at  at timely fastion.  That is if Dr Inocencio is also aligned   Copying Dr Albertus       Latest Ref Rng & Units 04/24/2024    9:45 AM 07/14/2023    9:32 AM 02/07/2023    3:07 PM 09/01/2020   11:15 AM 04/04/2020   12:46 PM  PFT Results  FVC-Pre L 2.72  2.71  2.76   2.25   FVC-Predicted Pre % 86  85  86  78  69   FVC-Post L 2.82   2.62     FVC-Predicted Post % 89   82     Pre FEV1/FVC % % 79  84  82  82  85   Post FEV1/FCV % % 80   85     FEV1-Pre L 2.15  2.26  2.26  2.08  1.92   FEV1-Predicted Pre % 88  91  91  81  75   FEV1-Post L 2.25   2.22     DLCO uncorrected ml/min/mmHg 16.84  15.38  20.27  15.24  14.04   DLCO UNC% % 87  78  103  77  71   DLCO corrected ml/min/mmHg   20.27   13.23   DLCO COR %Predicted %   103   66   DLVA Predicted % 98  90  120  86  97   TLC L 4.89   4.49     TLC % Predicted % 99   91     RV % Predicted % 109   96

## 2024-08-01 ENCOUNTER — Encounter: Payer: Self-pay | Admitting: Internal Medicine

## 2024-08-01 ENCOUNTER — Other Ambulatory Visit: Payer: Self-pay

## 2024-08-01 DIAGNOSIS — R195 Other fecal abnormalities: Secondary | ICD-10-CM

## 2024-08-01 DIAGNOSIS — R630 Anorexia: Secondary | ICD-10-CM

## 2024-08-01 DIAGNOSIS — K219 Gastro-esophageal reflux disease without esophagitis: Secondary | ICD-10-CM

## 2024-08-01 NOTE — Telephone Encounter (Signed)
 Left message for pt to call back

## 2024-08-01 NOTE — Telephone Encounter (Signed)
 No exclusion criteria and procedures were okayed for [facility], but hospital preferred. Got it.

## 2024-08-01 NOTE — Telephone Encounter (Signed)
 Pt decided to have endo/colon done with another doctor at [facility]. Pt scheduled at [facility] on 08/29/24 with Dr. Abran. She is aware of appt, already has prep and new instrudtions were sent to her via mychart.

## 2024-08-03 MED ORDER — SPIRIVA RESPIMAT 1.25 MCG/ACT IN AERS: 2.0000 | INHALATION_SPRAY | Freq: Every day | RESPIRATORY_TRACT | 6 refills | Status: DC

## 2024-08-09 ENCOUNTER — Encounter: Admitting: Internal Medicine

## 2024-08-09 ENCOUNTER — Other Ambulatory Visit (HOSPITAL_COMMUNITY): Payer: Self-pay

## 2024-08-09 DIAGNOSIS — J9601 Acute respiratory failure with hypoxia: Secondary | ICD-10-CM | POA: Diagnosis not present

## 2024-08-09 DIAGNOSIS — I5189 Other ill-defined heart diseases: Secondary | ICD-10-CM | POA: Diagnosis not present

## 2024-08-09 NOTE — Telephone Encounter (Signed)
 Pharmacy team,  Can you please see if Spiriva  is on the preferred list for this patient?  She states the medication at her pharmacy is $600.  Please advise.  Thank you.

## 2024-08-09 NOTE — Telephone Encounter (Signed)
 Non-spec med

## 2024-08-14 NOTE — Telephone Encounter (Signed)
 I called [facility] and left a detailed message to find out how much the Spiriva  will cost her out of pocket.  I left my directed extension to return my call.  Sent message to patient letting her know I would message when I heard back from the pharmacy.

## 2024-08-21 ENCOUNTER — Other Ambulatory Visit: Payer: Self-pay | Admitting: *Deleted

## 2024-08-21 MED ORDER — SPIRIVA RESPIMAT 1.25 MCG/ACT IN AERS: 2.0000 | INHALATION_SPRAY | Freq: Every day | RESPIRATORY_TRACT | 4 refills | Status: DC

## 2024-08-22 ENCOUNTER — Encounter (HOSPITAL_COMMUNITY): Payer: Self-pay | Admitting: Internal Medicine

## 2024-08-22 NOTE — Progress Notes (Signed)
 Attempted to obtain medical history for pre op call via telephone, unable to reach at this time. HIPAA compliant voicemail message left requesting return call to pre surgical testing department.

## 2024-08-24 ENCOUNTER — Telehealth: Payer: Self-pay

## 2024-08-24 NOTE — Telephone Encounter (Signed)
 Procedure:EGD Procedure date: 08/29/24 Procedure location: [facility] Arrival Time: 9:36 Spoke with the patient Y/N: N Any prep concerns? N  Has the patient obtained the prep from the pharmacy ? N Do you have a care partner and transportation: N Any additional concerns? N

## 2024-08-24 NOTE — Telephone Encounter (Signed)
 Have you talked to this pt about her procedure?

## 2024-08-26 NOTE — Telephone Encounter (Signed)
 Pyrtle patient put on my hospital schedule

## 2024-08-27 NOTE — Telephone Encounter (Signed)
 See previous notes below:  Patient Message Open 07/30/2024 [facility]   Mychart, Generic Provider   All Conversations: Endoscopy and Colonoscopy (Newest Message First)            Abran Norleen SAILOR, MD to Me  Pyrtle, Gordy HERO, MD  [place], PA-C     08/01/24  3:17 PM Note No exclusion criteria and procedures were okayed for [facility], but hospital preferred. Got it.     Me to Abran Norleen SAILOR, MD      08/01/24  3:13 PM This note and options were at the bottom of the note. Cardiology and pulmonary felt it the best option for her to have procedure at the hospital.   Here are her options: In Hospital with another MD besides Dr. Albertus who has sooner available appointment.  Notify patient this is the safest and preferred option. In [facility] endoscopy center with Dr. Albertus or other MD.  This has been approved by Dr. Geronimo and Dr. Inocencio, yet not preferred. Make sure patient is aware of the risks and benefits of Hospital setting (more medical support if there was a complication from anesthesia) verses Outpatient Endoscopy center (would need to call EMS to transport to hospital if there were complications from anesthesia or procedures). Diagnosis for Colonoscopy: Positive Cologuard.  Diagnosis for EGD: GERD, Decreased appetite. [place], PA-C   Abran Norleen SAILOR, MD to Me  [place], PA-C  Pyrtle, Gordy HERO, MD      08/01/24  2:41 PM Noted. Asked to perform endoscopic procedures, at the hospital, on this patient of another provider, to expedite care. I did not see any [facility] exclusion criteria.  Please let me know if there are any. Thanks, Dr. Abran    08/01/24  2:24 PM You routed this conversation to [place], PA-C  Pyrtle, Gordy HERO, MD  Abran Norleen SAILOR, MD   Me    08/01/24  2:24 PM Note Pt decided to have endo/colon done with another doctor at [facility]. Pt scheduled at [facility] on 08/29/24 with Dr. Abran. She is aware of appt, already has prep and new instrudtions were  sent to her via mychart.        08/01/24 11:13 AM You routed this conversation to Me  Me    08/01/24 11:12 AM Note Left message for pt to call back.      [place], PA-C to [facility] Pod A Triage  Pyrtle, Gordy HERO, MD  Geronimo Amel, MD  Watt Mirza, MD  Inocencio Soyla Lunger, MD      08/01/24 10:54 AM Thank you Dr. Geronimo and Dr. Albertus.   Pod A Triage Nurse, Please call patient and re-schedule her EGD and Colonoscopy procedures. Here are her options: In Hospital with another MD besides Dr. Albertus who has sooner available appointment.  Notify patient this is the safest and preferred option. In [facility] endoscopy center with Dr. Albertus or other MD.  This has been approved by Dr. Geronimo and Dr. Inocencio, yet not preferred. Make sure patient is aware of the risks and benefits of Hospital setting (more medical support if there was a complication from anesthesia) verses Outpatient Endoscopy center (would need to call EMS to transport to hospital if there were complications from anesthesia or procedures). Diagnosis for Colonoscopy: Positive Cologuard.  Diagnosis for EGD: GERD, Decreased appetite. [place], PA-C

## 2024-08-28 NOTE — Anesthesia Preprocedure Evaluation (Signed)
 Anesthesia Evaluation    Reviewed: Allergy & Precautions, Patient's Chart, lab work & pertinent test results  Airway        Dental   Pulmonary sleep apnea , COPD,  COPD inhaler, former smoker          Cardiovascular hypertension, + dysrhythmias + pacemaker      Neuro/Psych  PSYCHIATRIC DISORDERS Anxiety Depression       GI/Hepatic ,GERD  ,,  Endo/Other    Renal/GU      Musculoskeletal   Abdominal   Peds  Hematology   Anesthesia Other Findings All: See list  Reproductive/Obstetrics                              Anesthesia Physical Anesthesia Plan  ASA: 2  Anesthesia Plan: MAC   Post-op Pain Management: Minimal or no pain anticipated   Induction: Intravenous  PONV Risk Score and Plan: Treatment may vary due to age or medical condition and Propofol  infusion  Airway Management Planned: Natural Airway and Nasal Cannula  Additional Equipment: None  Intra-op Plan:   Post-operative Plan:   Informed Consent:   Plan Discussed with:   Anesthesia Plan Comments: (+ cologuard/GERD/deceased Appetite for EGD colon)        Anesthesia Quick Evaluation

## 2024-08-29 ENCOUNTER — Encounter (HOSPITAL_COMMUNITY): Payer: Self-pay | Admitting: Internal Medicine

## 2024-08-29 ENCOUNTER — Encounter (HOSPITAL_COMMUNITY): Admission: RE | Disposition: A | Payer: Self-pay | Source: Home / Self Care | Attending: Internal Medicine

## 2024-08-29 ENCOUNTER — Telehealth: Payer: Self-pay

## 2024-08-29 ENCOUNTER — Other Ambulatory Visit: Payer: Self-pay

## 2024-08-29 ENCOUNTER — Ambulatory Visit (HOSPITAL_COMMUNITY)
Admission: RE | Admit: 2024-08-29 | Discharge: 2024-08-29 | Disposition: A | Attending: Internal Medicine | Admitting: Internal Medicine

## 2024-08-29 ENCOUNTER — Ambulatory Visit (HOSPITAL_COMMUNITY): Payer: Self-pay | Admitting: Anesthesiology

## 2024-08-29 ENCOUNTER — Encounter (HOSPITAL_COMMUNITY): Payer: Self-pay | Admitting: Anesthesiology

## 2024-08-29 DIAGNOSIS — R197 Diarrhea, unspecified: Secondary | ICD-10-CM | POA: Diagnosis not present

## 2024-08-29 DIAGNOSIS — Z1211 Encounter for screening for malignant neoplasm of colon: Secondary | ICD-10-CM

## 2024-08-29 DIAGNOSIS — K2100 Gastro-esophageal reflux disease with esophagitis, without bleeding: Secondary | ICD-10-CM

## 2024-08-29 DIAGNOSIS — Z950 Presence of cardiac pacemaker: Secondary | ICD-10-CM | POA: Diagnosis not present

## 2024-08-29 DIAGNOSIS — R195 Other fecal abnormalities: Secondary | ICD-10-CM

## 2024-08-29 DIAGNOSIS — R634 Abnormal weight loss: Secondary | ICD-10-CM | POA: Diagnosis not present

## 2024-08-29 DIAGNOSIS — R1032 Left lower quadrant pain: Secondary | ICD-10-CM | POA: Diagnosis not present

## 2024-08-29 DIAGNOSIS — G4733 Obstructive sleep apnea (adult) (pediatric): Secondary | ICD-10-CM | POA: Diagnosis not present

## 2024-08-29 DIAGNOSIS — I110 Hypertensive heart disease with heart failure: Secondary | ICD-10-CM | POA: Diagnosis not present

## 2024-08-29 DIAGNOSIS — K5900 Constipation, unspecified: Secondary | ICD-10-CM | POA: Diagnosis not present

## 2024-08-29 DIAGNOSIS — R630 Anorexia: Secondary | ICD-10-CM | POA: Diagnosis not present

## 2024-08-29 DIAGNOSIS — R1031 Right lower quadrant pain: Secondary | ICD-10-CM | POA: Diagnosis not present

## 2024-08-29 DIAGNOSIS — I5022 Chronic systolic (congestive) heart failure: Secondary | ICD-10-CM | POA: Diagnosis not present

## 2024-08-29 DIAGNOSIS — K219 Gastro-esophageal reflux disease without esophagitis: Secondary | ICD-10-CM

## 2024-08-29 DIAGNOSIS — F32A Depression, unspecified: Secondary | ICD-10-CM | POA: Diagnosis not present

## 2024-08-29 DIAGNOSIS — M79605 Pain in left leg: Secondary | ICD-10-CM | POA: Diagnosis not present

## 2024-08-29 DIAGNOSIS — K621 Rectal polyp: Secondary | ICD-10-CM

## 2024-08-29 DIAGNOSIS — Z79899 Other long term (current) drug therapy: Secondary | ICD-10-CM | POA: Diagnosis not present

## 2024-08-29 DIAGNOSIS — Z6835 Body mass index (BMI) 35.0-35.9, adult: Secondary | ICD-10-CM | POA: Diagnosis not present

## 2024-08-29 DIAGNOSIS — R1013 Epigastric pain: Secondary | ICD-10-CM | POA: Diagnosis not present

## 2024-08-29 DIAGNOSIS — R5383 Other fatigue: Secondary | ICD-10-CM | POA: Diagnosis not present

## 2024-08-29 DIAGNOSIS — F411 Generalized anxiety disorder: Secondary | ICD-10-CM | POA: Diagnosis not present

## 2024-08-29 DIAGNOSIS — Z87891 Personal history of nicotine dependence: Secondary | ICD-10-CM | POA: Diagnosis not present

## 2024-08-29 DIAGNOSIS — J849 Interstitial pulmonary disease, unspecified: Secondary | ICD-10-CM | POA: Diagnosis not present

## 2024-08-29 DIAGNOSIS — K449 Diaphragmatic hernia without obstruction or gangrene: Secondary | ICD-10-CM | POA: Diagnosis not present

## 2024-08-29 HISTORY — PX: POLYPECTOMY: SHX149

## 2024-08-29 HISTORY — PX: COLONOSCOPY: SHX5424

## 2024-08-29 HISTORY — PX: ESOPHAGOGASTRODUODENOSCOPY: SHX5428

## 2024-08-29 HISTORY — PX: BIOPSY OF SKIN SUBCUTANEOUS TISSUE AND/OR MUCOUS MEMBRANE: SHX6741

## 2024-08-29 SURGERY — EGD (ESOPHAGOGASTRODUODENOSCOPY)
Anesthesia: Monitor Anesthesia Care

## 2024-08-29 MED ORDER — PROPOFOL 10 MG/ML IV BOLUS
INTRAVENOUS | Status: DC | PRN
  Administered 2024-08-29: 20 mg via INTRAVENOUS
  Administered 2024-08-29: 70 mg via INTRAVENOUS
  Administered 2024-08-29: 20 mg via INTRAVENOUS

## 2024-08-29 MED ORDER — PANTOPRAZOLE SODIUM 40 MG PO TBEC: 40.0000 mg | DELAYED_RELEASE_TABLET | Freq: Every day | ORAL | 11 refills | Status: AC

## 2024-08-29 MED ORDER — PROPOFOL 500 MG/50ML IV EMUL
INTRAVENOUS | Status: DC | PRN
  Administered 2024-08-29: 125 ug/kg/min via INTRAVENOUS

## 2024-08-29 MED ORDER — PHENYLEPHRINE 80 MCG/ML (10ML) SYRINGE FOR IV PUSH (FOR BLOOD PRESSURE SUPPORT)
PREFILLED_SYRINGE | INTRAVENOUS | Status: DC | PRN
  Administered 2024-08-29: 160 ug via INTRAVENOUS
  Administered 2024-08-29: 80 ug via INTRAVENOUS

## 2024-08-29 MED ORDER — SODIUM CHLORIDE 0.9 % IV SOLN: INTRAVENOUS | Status: DC

## 2024-08-29 MED ORDER — LACTATED RINGERS IV SOLN
INTRAVENOUS | Status: AC | PRN
  Administered 2024-08-29: 1000 mL via INTRAVENOUS

## 2024-08-29 NOTE — Op Note (Signed)
 [facility] Patient Name: Anne [facility] Procedure Date: 08/29/2024 MRN: [id number] Attending MD: Norleen SAILOR. Abran , MD, [id number] Date of Birth: [birth year] CSN: [id number] Age: 63 Admit Type: Outpatient Procedure:                Colonoscopy cold snare polypectomy x 1, with                            biopsies Indications:              Positive Cologuard test, Clinically significant                            diarrhea of unexplained origin Providers:                Norleen SAILOR. Abran, MD, Jacquelyn Jaci Pierce, RN,                            Farris [place], Technician, Jasmine Pettiford,                            Technician Referring MD:             Jacques Schroeder, MD Medicines:                Monitored Anesthesia Care Complications:            No immediate complications. Estimated blood loss:                            None. Estimated Blood Loss:     Estimated blood loss: none. Procedure:                Pre-Anesthesia Assessment:                           - Prior to the procedure, a History and Physical                            was performed, and patient medications and                            allergies were reviewed. The patient's tolerance of                            previous anesthesia was also reviewed. The risks                            and benefits of the procedure and the sedation                            options and risks were discussed with the patient.                            All questions were answered, and informed consent                            was obtained. Prior Anticoagulants: The  patient has                            taken no anticoagulant or antiplatelet agents. ASA                            Grade Assessment: III - A patient with severe                            systemic disease. After reviewing the risks and                            benefits, the patient was deemed in satisfactory                            condition to undergo the  procedure.                           After obtaining informed consent, the colonoscope                            was passed under direct vision. Throughout the                            procedure, the patient's blood pressure, pulse, and                            oxygen  saturations were monitored continuously. The                            CF-HQ190L ([id number]) Olympus colonoscope was                            introduced through the anus and advanced to the the                            cecum, identified by appendiceal orifice and                            ileocecal valve. The ileocecal valve, appendiceal                            orifice, and rectum were photographed. The quality                            of the bowel preparation was excellent. The                            colonoscopy was performed without difficulty. The                            patient tolerated the procedure well. The bowel  preparation used was SUPREP via split dose                            instruction. Scope In: 11:35:36 AM Scope Out: 11:50:19 AM Scope Withdrawal Time: 0 hours 11 minutes 34 seconds  Total Procedure Duration: 0 hours 14 minutes 43 seconds  Findings:      A 5 mm polyp was found in the rectum. The polyp was removed with a cold       snare. Resection and retrieval were complete.      The exam was otherwise without abnormality on direct and retroflexion       views.      Biopsies for histology were taken with a cold forceps from the entire       colon for evaluation of microscopic colitis. Impression:               - One 5 mm polyp in the rectum, removed with a cold                            snare. Resected and retrieved.                           - The examination was otherwise normal on direct                            and retroflexion views.                           - Biopsies were taken with a cold forceps from the                            entire colon  for evaluation of microscopic colitis. Moderate Sedation:      none Recommendation:           - Repeat colonoscopy in 7-10 years for                            surveillance, with Dr. Albertus.                           - Patient has a contact number available for                            emergencies. The signs and symptoms of potential                            delayed complications were discussed with the                            patient. Return to normal activities tomorrow.                            Written discharge instructions were provided to the                            patient.                           -  Resume previous diet.                           - Continue present medications.                           - Await pathology results.                           - EGD today. Please see report regarding findings                            and final recommendations Procedure Code(s):        --- Professional ---                           [phone], Colonoscopy, flexible; with removal of                            tumor(s), polyp(s), or other lesion(s) by snare                            technique                           45380, 59, Colonoscopy, flexible; with biopsy,                            single or multiple Diagnosis Code(s):        --- Professional ---                           D12.8, Benign neoplasm of rectum                           R19.5, Other fecal abnormalities                           R19.7, Diarrhea, unspecified CPT copyright 2022 American Medical Association. All rights reserved. The codes documented in this report are preliminary and upon coder review may  be revised to meet current compliance requirements. Norleen SAILOR. Abran, MD 08/29/2024 11:59:12 AM This report has been signed electronically. Number of Addenda: 0

## 2024-08-29 NOTE — Telephone Encounter (Signed)
-----   Message from Norleen Kiang sent at 08/29/2024 12:42 PM EST ----- Regarding: Follow-up plans Rock, This patient recently established with our practice and was seen by [facility] June 27, 2024.  She was assigned to Dr. Albertus.  She needed colonoscopy and upper endoscopy in the hospital setting, which was performed today.  No significant abnormalities.  See reports.  Please assist with the following: 1.  Prescribe pantoprazole 40 mg daily; #30; 11 refills. 2.  Arrange an office follow-up appointment with Ellouise or Dr. Albertus in 4 to 6 weeks.  Please let the patient know. Thanks, Dr. Kiang

## 2024-08-29 NOTE — Telephone Encounter (Signed)
 Script sent to pharmacy, pt scheduled to see Dr. Albertus 09/28/24 at 9:10am. Pt notified of appt via mychart.

## 2024-08-29 NOTE — Transfer of Care (Signed)
 Immediate Anesthesia Transfer of Care Note  Patient: Julene Rahn Dulski  Procedure(s) Performed: EGD (ESOPHAGOGASTRODUODENOSCOPY) COLONOSCOPY BIOPSY, SKIN, SUBCUTANEOUS TISSUE, OR MUCOUS MEMBRANE POLYPECTOMY, INTESTINE  Patient Location: Endoscopy Unit  Anesthesia Type:MAC  Level of Consciousness: drowsy and patient cooperative  Airway & Oxygen  Therapy: Patient Spontanous Breathing  Post-op Assessment: Report given to RN and Post -op Vital signs reviewed and stable  Post vital signs: Reviewed and stable  Last Vitals:  Vitals Value Taken Time  BP 126/61 08/29/24 12:11  Temp    Pulse 72 08/29/24 12:12  Resp 13 08/29/24 12:12  SpO2 97 % 08/29/24 12:12  Vitals shown include unfiled device data.  Last Pain:  Vitals:   08/29/24 1209  TempSrc:   PainSc: 0-No pain         Complications: No notable events documented.

## 2024-08-29 NOTE — H&P (Signed)
 Expand All Collapse All       Ellouise Console, PA-C [address] [place], [place]  [address] Phone: [phone]   Gastroenterology Consultation   Referring Provider:     Watt Mirza, MD Primary Care Physician:  Watt Mirza, MD Primary Gastroenterologist:  Ellouise Console, PA-C / Dr. Gordy Starch  Reason for Consultation:     Fatigue, abdominal pain, positive Cologuard        HPI:   Anne Rush is a 63 y.o. y/o female referred for consultation & management  by Watt Mirza, MD.     New patient.  Here to evaluate fatigue and abdominal pain.  Currently on Pepcid  20 mg daily for acid reflux.  Takes vitamin B12.  She has multiple upper and lower GI symptoms.  She has had bilateral lower abdominal pain since 10/2023 located in the LLQ and RLQ.  She has pain in her left groin and left leg.  Increased pain with going from sitting to standing and better after she moves around.  She saw her gynecologist and pelvic ultrasound was unrevealing.  She has bowel irregularities.  Has had some mild constipation alternating with diarrhea.  She denies rectal bleeding.  Typically has a bowel movement 3 times daily with yellow stools.  She admits to acid reflux, belching, bloating, gas, dyspepsia, decreased appetite.  Takes Pepcid  which helps.  Her weight is down 15 pounds in the past 6 months, unintentional.  She denies any family history of colon or GI malignancies.  No family history of IBD or celiac.   04/21/2024: Positive screening Cologuard test.  She has never had a colonoscopy, EGD or GI evaluation.   03/28/2024 labs: Normal CBC (Hgb 14.3), normal CMP.   12/2023 CT abdomen pelvis with contrast (for LLQ pain): No acute abnormality in the abdomen or pelvis.  Sigmoid diverticulosis but no diverticulitis.   PMH: CHF, heart block s/p pacemaker, hypertension, interstitial lung disease, OSA, GERD, anxiety, hyperlipidemia.  02/2023 echo LVEF 60 to 65%.  She is not on oxygen .  Cardiologist Dr. Inocencio.   Pulmonologist Dr. Geronimo.       Past Medical History:  Diagnosis Date   Chronic systolic CHF (congestive heart failure) (HCC) 07/15/2020   Complete heart block (HCC) 02/26/2016    s/p Pacemaker   Family history of ovarian cancer     Generalized anxiety disorder 07/15/2020   GERD (gastroesophageal reflux disease)     History of kidney stones     Hyperlipidemia     Hypertension     Interstitial lung disease (HCC)     Presence of permanent cardiac pacemaker     Pulmonary fibrosis (HCC)     Squamous cell carcinoma 2014    skin cancer   Subclinical hyperthyroidism 06/17/2014               Past Surgical History:  Procedure Laterality Date   CESAREAN SECTION       CYSTOSCOPY W/ URETERAL STENT PLACEMENT Left 07/15/2020    Procedure: CYSTOSCOPY WITH RETROGRADE PYELOGRAM/URETERAL STENT PLACEMENT;  Surgeon: Twylla Glendia BROCKS, MD;  Location: [facility];  Service: Urology;  Laterality: Left;   CYSTOSCOPY/URETEROSCOPY/HOLMIUM LASER/STENT PLACEMENT Left 07/28/2020    Procedure: CYSTOSCOPY/URETEROSCOPY/HOLMIUM LASER/STENT Exchange;  Surgeon: Penne Knee, MD;  Location: [facility];  Service: Urology;  Laterality: Left;   EP IMPLANTABLE DEVICE N/A 02/26/2016    Procedure: Pacemaker Implant;  Surgeon: Will Gladis Inocencio, MD;  Location: [facility];  Service: Cardiovascular;  Laterality: N/A;   MOHS  SURGERY        skin cancer face   TUBAL LIGATION                     Prior to Admission medications   Medication Sig Start Date End Date Taking? Authorizing Provider  acetaminophen  (TYLENOL ) 500 MG tablet Take 1,000 mg by mouth every 6 (six) hours as needed. As needed       [provider]  carvedilol  (COREG ) 25 MG tablet Take 1 tablet (25 mg total) by mouth 2 (two) times daily with a meal. 04/26/24     Camnitz, Will Gladis, MD  Cholecalciferol  (VITAMIN D3 SUPER STRENGTH) 50 MCG (2000 UT) TABS Take 1 tablet by mouth daily.       [provider]  Cranberry 450 MG TABS  Take 2 tablets by mouth daily.       [provider]  Cyanocobalamin (VITAMIN B-12) 3000 MCG SUBL Place 2 tablets under the tongue daily.       [provider]  diazepam  (VALIUM ) 5 MG tablet TAKE 1 TABLET BY MOUTH EVERY 8 HOURS FOR ANXIETY 04/02/24     Copland, Jacques, MD  escitalopram  (LEXAPRO ) 5 MG tablet Take 1 tablet (5 mg total) by mouth daily. 04/02/24     Copland, Jacques, MD  famotidine  (PEPCID ) 20 MG tablet Take 1 tablet (20 mg total) by mouth daily. 04/06/20     Dickie Begun, MD  fluticasone  (FLONASE ) 50 MCG/ACT nasal spray Place 2 sprays into both nostrils daily. 04/02/24     Copland, Jacques, MD  Magnesium  Citrate 200 MG TABS Take 2 tablets by mouth daily.       [provider]  spironolactone  (ALDACTONE ) 25 MG tablet TAKE 1 TABLET BY MOUTH DAILY 05/07/24     Copland, Jacques, MD  vitamin C  (ASCORBIC ACID ) 250 MG tablet Take 1,000 mg by mouth daily.       [provider]           Family History  Problem Relation Age of Onset   Anxiety disorder Mother     Hypertension Mother     Ovarian cancer Mother 24   Prostate cancer Father     Cancer Maternal Aunt          breast   Heart disease Maternal Grandfather     Heart failure Maternal Grandfather     Colon cancer Neg Hx     Colon polyps Neg Hx     Esophageal cancer Neg Hx     Pancreatic cancer Neg Hx     Stomach cancer Neg Hx            Social History  Social History         Tobacco Use   Smoking status: Former      Current packs/day: 0.00      Average packs/day: 0.5 packs/day for 30.0 years (15.0 ttl pk-yrs)      Types: Cigarettes      Start date: 51      Quit date: 2012      Years since quitting: 13.7   Smokeless tobacco: Never  Vaping Use   Vaping status: Never Used  Substance Use Topics   Alcohol use: No   Drug use: No             Allergies as of 06/27/2024 - Review Complete 05/23/2024  Allergen Reaction Noted   Codeine Hives and Itching     Sulfonamide derivatives  Hives 10/21/2006  Paroxetine Other (See Comments) 03/08/2012   Iodinated contrast media Rash 01/11/2024   Lisinopril  Rash 02/29/2016   Losartan  potassium Other (See Comments) 08/23/2018   Neomycin-polymyxin-dexameth Itching, Swelling, and Other (See Comments) 03/13/2018   Oxycodone -acetaminophen  Other (See Comments) 10/21/2006   Sulfa antibiotics Hives and Rash 02/27/2013      Review of Systems:    All systems reviewed and negative except where noted in HPI.    Physical Exam:  BP 136/82   Pulse 61   Ht 5' 2.25 (1.581 m)   Wt 198 lb (89.8 kg)   SpO2 95%   BMI 35.92 kg/m  No LMP recorded. Patient is postmenopausal.   General:   Alert,  Well-developed, well-nourished, pleasant and cooperative in NAD Lungs:  Respirations even and unlabored.  Clear throughout to auscultation.   No wheezes, crackles, or rhonchi. No acute distress. Heart:  Regular rate and rhythm; no murmurs, clicks, rubs, or gallops. Abdomen:  Normal bowel sounds.  No bruits.  Soft, and non-distended without masses, hepatosplenomegaly or hernias noted.  No Tenderness.  No guarding or rebound tenderness.    Neurologic:  Alert and oriented x3;  grossly normal neurologically. Psych:  Alert and cooperative. Normal mood and affect.   Imaging Studies:  Imaging Results  CUP PACEART REMOTE DEVICE CHECK Result Date: 06/07/2024 PPM Scheduled remote reviewed. Normal device function.  Presenting rhythm: AS/VP Next remote 91 days. LA, CVRS      Labs: CBC Labs (Brief)          Component Value Date/Time    WBC 4.6 03/28/2024 1020    RBC 4.58 03/28/2024 1020    HGB 14.3 03/28/2024 1020    HCT 41.4 03/28/2024 1020    PLT 189.0 03/28/2024 1020    MCV 90.5 03/28/2024 1020        CMP     Labs (Brief)          Component Value Date/Time    NA 138 03/28/2024 1020    K 4.0 03/28/2024 1020    CL 100 03/28/2024 1020    CO2 32 03/28/2024 1020    GLUCOSE 98 03/28/2024 1020    BUN 13 03/28/2024 1020    CREATININE 0.65  03/28/2024 1020    CALCIUM 9.3 03/28/2024 1020    PROT 7.2 03/28/2024 1020    ALBUMIN 4.7 03/28/2024 1020    AST 16 03/28/2024 1020    ALT 13 03/28/2024 1020    ALKPHOS 49 03/28/2024 1020    BILITOT 0.7 03/28/2024 1020    GFRNONAA >60 09/03/2022 0329    GFRAA >60 04/05/2020 1059        Assessment and Plan:    Issis Lindseth Maraj is a 63 y.o. y/o female has been referred for:    1.  Positive Cologuard - Scheduling Colonoscopy I discussed risks of colonoscopy with patient to include risk of bleeding, colon perforation, and risk of sedation.  Patient expressed understanding and agrees to proceed with colonoscopy.  - Patient requested to schedule procedures with Dr. Albertus.   2.  Abdominal pain: Negative abdominal pelvic CT 12/2023.  Left Groin pain, positional, most likely musculoskeletal pain. - Reassurance   3.  GERD / Decrease Appetite / Mild Weight Loss - Scheduling EGD I discussed risks of EGD with patient to include risk of bleeding, perforation, and risk of sedation.  Patient expressed understanding and agrees to proceed with EGD.  - Continue Pepcid    4.  Fatigue: Recent CBC and CMP labs normal.   5.  Comorbidities: CHF, heart block s/p pacemaker, hypertension, interstitial lung disease, OSA, GERD, anxiety, hyperlipidemia.  02/2023 echo LVEF 60 to 65%.  She is not on oxygen .  Cardiologist Dr. Inocencio.  Pulmonologist Dr. Geronimo. - Requesting cardiology clearance for EGD and colonoscopy procedures with sedation scheduled in [facility].   Follow up With Dr. Albertus after Procedures.   Ellouise Console, PA-C      Recent office H&P as noted above.  Patient of Dr. Pamula who was evaluated for positive Cologuard testing and chronic GERD.  As well, abdominal pain.  Due to comorbidities her procedures were set up at the hospital.  Also, in order to expedite her care, the patient was scheduled today with myself.  She was interviewed and examined in the preprocedure area of the endoscopy suite  today.  No interval changes.  She is now for colonoscopy and upper endoscopy  Norleen SAILOR. Abran Raddle., M.D. [facility] Division of Gastroenterology

## 2024-08-29 NOTE — Discharge Instructions (Signed)

## 2024-08-29 NOTE — Op Note (Signed)
 [facility] Patient Name: Anne Rush Procedure Date: 08/29/2024 MRN: [id number] Attending MD: Norleen SAILOR. Abran , MD, [id number] Date of Birth: 13-Jul-1961 CSN: [id number] Age: 63 Admit Type: Outpatient Procedure:                Upper GI endoscopy biopsies Indications:              Dyspepsia, Suspected esophageal reflux, diarrhea.                            Seen in the office by GI physician assistant                            June 27, 2024 Providers:                Norleen SAILOR. Abran, MD, Jacquelyn Jaci Pierce, RN,                            Farris Southgate, Technician, Jasmine Pettiford,                            Technician Referring MD:             Jacques Schroeder, MD Medicines:                Monitored Anesthesia Care Complications:            No immediate complications. Estimated Blood Loss:     Estimated blood loss: none. Procedure:                Pre-Anesthesia Assessment:                           - Prior to the procedure, a History and Physical                            was performed, and patient medications and                            allergies were reviewed. The patient's tolerance of                            previous anesthesia was also reviewed. The risks                            and benefits of the procedure and the sedation                            options and risks were discussed with the patient.                            All questions were answered, and informed consent                            was obtained. Prior Anticoagulants: The patient has  taken no anticoagulant or antiplatelet agents. ASA                            Grade Assessment: III - A patient with severe                            systemic disease. After reviewing the risks and                            benefits, the patient was deemed in satisfactory                            condition to undergo the procedure.                           After  obtaining informed consent, the endoscope was                            passed under direct vision. Throughout the                            procedure, the patient's blood pressure, pulse, and                            oxygen  saturations were monitored continuously. The                            [id number]-H190 ([id number]) Olympus endoscope was introduced                            through the mouth, and advanced to the second part                            of duodenum. The upper GI endoscopy was                            accomplished without difficulty. The patient                            tolerated the procedure well. Scope In: Scope Out: Findings:      The esophagus revealed mild esophagitis as manifested by edema and       friability of the mucosal Z-line.      The stomach was normal except for small hiatal hernia.      The examined duodenum was normal. Biopsies were taken with a cold       forceps for histology.      The cardia and gastric fundus were normal on retroflexion. Impression:               1. GERD with mild esophagitis                           2. Otherwise unremarkable EGD  3. Status post duodenal biopsies. Moderate Sedation:      none Recommendation:           - Patient has a contact number available for                            emergencies. The signs and symptoms of potential                            delayed complications were discussed with the                            patient. Return to normal activities tomorrow.                            Written discharge instructions were provided to the                            patient.                           - Resume previous diet.                           - Continue present medications.                           - Await pathology results.                           - PRESCRIBE PANTOPRAZOLE 40 mg daily; #30; 11                            refills. Take 1 by mouth daily in the morning 30                             minutes before breakfast. This medication will heal                            your esophageal inflammation and prevent esophageal                            damage moving forward.                           - Office follow-up with Ellouise Console, PA-C or Dr.                            Albertus in about 46 weeks Procedure Code(s):        --- Professional ---                           [phone], Esophagogastroduodenoscopy, flexible,                            transoral; with biopsy, single or multiple Diagnosis Code(s):        ---  Professional ---                           R10.13, Epigastric pain CPT copyright 2022 American Medical Association. All rights reserved. The codes documented in this report are preliminary and upon coder review may  be revised to meet current compliance requirements. Norleen SAILOR. Abran, MD 08/29/2024 12:16:07 PM This report has been signed electronically. Number of Addenda: 0

## 2024-08-29 NOTE — Anesthesia Postprocedure Evaluation (Signed)
 Anesthesia Post Note  Patient: Anne Rush  Procedure(s) Performed: EGD (ESOPHAGOGASTRODUODENOSCOPY) COLONOSCOPY BIOPSY, SKIN, SUBCUTANEOUS TISSUE, OR MUCOUS MEMBRANE POLYPECTOMY, INTESTINE     Patient location during evaluation: Endoscopy Anesthesia Type: MAC Level of consciousness: awake and alert Pain management: pain level controlled Vital Signs Assessment: post-procedure vital signs reviewed and stable Respiratory status: spontaneous breathing, nonlabored ventilation, respiratory function stable and patient connected to nasal cannula oxygen  Cardiovascular status: stable and blood pressure returned to baseline Postop Assessment: no apparent nausea or vomiting Anesthetic complications: no   No notable events documented.  Last Vitals:  Vitals:   08/29/24 1220 08/29/24 1230  BP: 117/77 124/64  Pulse: 72 60  Resp: 15 14  Temp:    SpO2: 94% 94%    Last Pain:  Vitals:   08/29/24 1230  TempSrc:   PainSc: 0-No pain                 Garnette DELENA Gab

## 2024-08-30 ENCOUNTER — Ambulatory Visit: Payer: Self-pay | Admitting: Internal Medicine

## 2024-08-30 LAB — SURGICAL PATHOLOGY

## 2024-08-31 MED ORDER — SPIRIVA RESPIMAT 1.25 MCG/ACT IN AERS: 2.0000 | INHALATION_SPRAY | Freq: Every day | RESPIRATORY_TRACT | 3 refills | Status: DC

## 2024-08-31 NOTE — Addendum Note (Signed)
 Addended by: MELVENIA POSEY R on: 08/31/2024 11:07 AM   Modules accepted: Orders

## 2024-09-02 ENCOUNTER — Encounter (HOSPITAL_COMMUNITY): Payer: Self-pay | Admitting: Internal Medicine

## 2024-09-02 NOTE — Progress Notes (Unsigned)
° ° ° °  Anne Rush Maiden, MD, CAQ Sports Medicine [facility] at [facility] [address] [place], [address]  Phone: [phone]  FAX: [phone]  Anne Rush - 63 y.o. female  MRN [id number]  Date of Birth: 11-Jun-1961  Date: 09/03/2024  PCP: Watt Mirza, MD  Referral: Watt Mirza, MD  No chief complaint on file.  Subjective:   Anne Rush is a 63 y.o. very pleasant female patient with There is no height or weight on file to calculate BMI. who presents with the following:  Discussed the use of AI scribe software for clinical note transcription with the patient, who gave verbal consent to proceed.  Anne Rush is here to follow-up regarding anxiety and depression.  She is currently on Lexapro  10 mg. History of Present Illness     Review of Systems is noted in the HPI, as appropriate  Objective:   There were no vitals taken for this visit.  GEN: No acute distress; alert,appropriate. PULM: Breathing comfortably in no respiratory distress PSYCH: Normally interactive.   Laboratory and Imaging Data:  Assessment and Plan:     ICD-10-CM   1. GAD (generalized anxiety disorder)  F41.1      Assessment & Plan   Medication Management during today's office visit: No orders of the defined types were placed in this encounter.  There are no discontinued medications.  Orders placed today for conditions managed today: No orders of the defined types were placed in this encounter.   Disposition: No follow-ups on file.  [employer] One speech-to-text software was used for transcription in this dictation.  Possible transcriptional errors can occur using Animal nutritionist.   Signed,  Mirza DASEN. Roby Donaway, MD   Outpatient Encounter Medications as of 09/03/2024  Medication Sig   acetaminophen  (TYLENOL ) 500 MG tablet Take 1,000 mg by mouth every 6 (six) hours as needed. As needed   carvedilol  (COREG ) 25 MG tablet Take 1 tablet (25 mg total)  by mouth 2 (two) times daily with a meal.   Cholecalciferol  (VITAMIN D3 SUPER STRENGTH) 50 MCG (2000 UT) TABS Take 1 tablet by mouth daily.   Cranberry 450 MG TABS Take 2 tablets by mouth daily.   Cyanocobalamin (VITAMIN B-12) 3000 MCG SUBL Place 2 tablets under the tongue daily.   diazepam  (VALIUM ) 5 MG tablet TAKE 1 TABLET BY MOUTH EVERY 8 HOURS FOR ANXIETY   escitalopram  (LEXAPRO ) 10 MG tablet Take 1 tablet (10 mg total) by mouth daily.   famotidine  (PEPCID ) 20 MG tablet Take 1 tablet (20 mg total) by mouth daily.   fluticasone  (FLONASE ) 50 MCG/ACT nasal spray Place 2 sprays into both nostrils daily.   Magnesium  Citrate 200 MG TABS Take 2 tablets by mouth daily.   pantoprazole  (PROTONIX ) 40 MG tablet Take 1 tablet (40 mg total) by mouth daily.   spironolactone  (ALDACTONE ) 25 MG tablet TAKE 1 TABLET BY MOUTH DAILY   Tiotropium Bromide (SPIRIVA  RESPIMAT) 1.25 MCG/ACT AERS Inhale 2 puffs into the lungs daily.   Tiotropium Bromide (SPIRIVA  RESPIMAT) 1.25 MCG/ACT AERS Inhale 2 puffs into the lungs daily.   vitamin C  (ASCORBIC ACID ) 250 MG tablet Take 1,000 mg by mouth daily.   Facility-Administered Encounter Medications as of 09/03/2024  Medication   albuterol  (PROVENTIL ) (2.5 MG/3ML) 0.083% nebulizer solution 2.5 mg

## 2024-09-03 ENCOUNTER — Encounter: Payer: Self-pay | Admitting: Family Medicine

## 2024-09-03 ENCOUNTER — Ambulatory Visit: Admitting: Family Medicine

## 2024-09-03 VITALS — BP 112/74 | HR 74 | Temp 97.7°F | Ht 62.25 in | Wt 198.1 lb

## 2024-09-03 DIAGNOSIS — F330 Major depressive disorder, recurrent, mild: Secondary | ICD-10-CM | POA: Diagnosis not present

## 2024-09-03 DIAGNOSIS — F411 Generalized anxiety disorder: Secondary | ICD-10-CM | POA: Diagnosis not present

## 2024-09-03 MED ORDER — BUPROPION HCL ER (XL) 150 MG PO TB24: 150.0000 mg | ORAL_TABLET | Freq: Every day | ORAL | 3 refills | Status: DC

## 2024-09-03 NOTE — Patient Instructions (Addendum)
 Take 1/2 tablet of Lexapro  for one week  Start Wellbutrin  once a day

## 2024-09-06 ENCOUNTER — Ambulatory Visit: Payer: BC Managed Care – PPO

## 2024-09-06 DIAGNOSIS — I442 Atrioventricular block, complete: Secondary | ICD-10-CM

## 2024-09-06 LAB — CUP PACEART REMOTE DEVICE CHECK
Battery Remaining Longevity: 6 mo
Battery Remaining Percentage: 4 %
Battery Voltage: 2.69 V
Brady Statistic AP VP Percent: 10 %
Brady Statistic AP VS Percent: 1 %
Brady Statistic AS VP Percent: 90 %
Brady Statistic AS VS Percent: 1 %
Brady Statistic RA Percent Paced: 9.8 %
Brady Statistic RV Percent Paced: 99 %
Date Time Interrogation Session: 20251218033939
Implantable Lead Connection Status: 753985
Implantable Lead Connection Status: 753985
Implantable Lead Implant Date: 20170608
Implantable Lead Implant Date: 20170608
Implantable Lead Location: 753859
Implantable Lead Location: 753860
Implantable Pulse Generator Implant Date: 20170608
Lead Channel Impedance Value: 450 Ohm
Lead Channel Impedance Value: 490 Ohm
Lead Channel Pacing Threshold Amplitude: 0.75 V
Lead Channel Pacing Threshold Amplitude: 1.125 V
Lead Channel Pacing Threshold Pulse Width: 0.4 ms
Lead Channel Pacing Threshold Pulse Width: 0.4 ms
Lead Channel Sensing Intrinsic Amplitude: 3.7 mV
Lead Channel Setting Pacing Amplitude: 1.375
Lead Channel Setting Pacing Amplitude: 2 V
Lead Channel Setting Pacing Pulse Width: 0.4 ms
Lead Channel Setting Sensing Sensitivity: 2.5 mV
Pulse Gen Model: 2272
Pulse Gen Serial Number: 7903881

## 2024-09-08 ENCOUNTER — Other Ambulatory Visit: Payer: Self-pay | Admitting: Cardiology

## 2024-09-09 NOTE — Progress Notes (Signed)
 Remote PPM Transmission

## 2024-09-11 ENCOUNTER — Other Ambulatory Visit: Payer: Self-pay | Admitting: *Deleted

## 2024-09-11 MED ORDER — SPIRIVA RESPIMAT 1.25 MCG/ACT IN AERS: 2.0000 | INHALATION_SPRAY | Freq: Every day | RESPIRATORY_TRACT | 3 refills | Status: AC

## 2024-09-12 ENCOUNTER — Ambulatory Visit: Payer: Self-pay | Admitting: Internal Medicine

## 2024-09-12 NOTE — Progress Notes (Signed)
 Sorry for late release. ANA trace positive.Likely not clinically significant. REst of blood work negative. We can discuss at followuo

## 2024-09-14 ENCOUNTER — Ambulatory Visit: Payer: Self-pay | Admitting: Cardiology

## 2024-09-28 ENCOUNTER — Encounter: Payer: Self-pay | Admitting: Internal Medicine

## 2024-09-28 ENCOUNTER — Ambulatory Visit (INDEPENDENT_AMBULATORY_CARE_PROVIDER_SITE_OTHER): Admitting: Internal Medicine

## 2024-09-28 ENCOUNTER — Other Ambulatory Visit (INDEPENDENT_AMBULATORY_CARE_PROVIDER_SITE_OTHER)

## 2024-09-28 VITALS — BP 140/78 | HR 62 | Ht 62.0 in | Wt 198.4 lb

## 2024-09-28 DIAGNOSIS — K449 Diaphragmatic hernia without obstruction or gangrene: Secondary | ICD-10-CM | POA: Diagnosis not present

## 2024-09-28 DIAGNOSIS — K580 Irritable bowel syndrome with diarrhea: Secondary | ICD-10-CM

## 2024-09-28 DIAGNOSIS — K219 Gastro-esophageal reflux disease without esophagitis: Secondary | ICD-10-CM

## 2024-09-28 DIAGNOSIS — K2100 Gastro-esophageal reflux disease with esophagitis, without bleeding: Secondary | ICD-10-CM | POA: Diagnosis not present

## 2024-09-28 MED ORDER — RIFAXIMIN 550 MG PO TABS: 550.0000 mg | ORAL_TABLET | Freq: Three times a day (TID) | ORAL | 0 refills | Status: DC

## 2024-09-28 NOTE — Progress Notes (Signed)
 "  Subjective:    Patient ID: Anne Rush, female    DOB: [birth year], 64 y.o.   MRN: [id number]  HPI  Anne Rush is a 64 year old female with congestive heart failure, heart block (status post pacemaker), interstitial lung disease, and sleep apnea who presents for follow up of chronic diarrhea and abnormal stool color.  Since early 2025, she has experienced a change in bowel habits characterized by mustard yellow, mushy stools. Initially, stools were runny and sometimes described as 'just pure water,' consistently yellow in color. Over the past year, stool consistency has remained mushy with increased urgency, often requiring immediate defecation after eating. She averages approximately six bowel movements per day without a regular pattern. Appetite is decreased, though she continues to eat until feeling full.  Intermittent nausea began around December 2025, without associated vomiting. Abdominal pain was present in the early months, radiating across the abdomen and to the back, and was severe enough to prevent her from laying her hands on her abdomen at night. This pain has since improved and is no longer as severe. She denies bloating, increased gas, or abdominal distension.  Colonoscopy on August 29, 2024, for positive Cologuard and diarrhea was performed; a 5 mm rectal polyp was found and biopsied. Upper endoscopy was performed and findings included mild esophagitis, Z line changes, and a small hiatal hernia. ANA was positive (1:80, nuclear and speckled pattern). Rheumatoid factor, CCP, sed rate, and quantiferon gold were negative or normal. CBC and hepatic function panel were performed and results were within normal limits. CT abdomen and pelvis showed sigmoid diverticulosis.  No new medications or supplements have been started in the past year. She previously took magnesium  sporadically but discontinued it. Current medications include famotidine  20 mg nightly. She has not started  pantoprazole  due to concerns about additional medications. Heartburn is rare, with only one recent episode after eating chocolate-covered cashews, which resolved with honey and water.  Review of Systems As per HPI, otherwise negative  Current Medications, Allergies, Past Medical History, Past Surgical History, Family History and Social History were reviewed in [employer] record.    Objective:   Physical Exam BP (!) 140/78   Pulse 62   Ht 5' 2 (1.575 m)   Wt 198 lb 6 oz (90 kg)   BMI 36.28 kg/m  Gen: awake, alert, NAD HEENT: anicteric  Ext: no c/c/e Neuro: nonfocal  Labs ANA (06/29/2024): Positive at 1:80, nuclear and speckled pattern. Rheumatoid factor (06/29/2024): Within normal limits. CCP (06/29/2024): Within normal limits. ESR (06/29/2024): Within normal limits. Quantiferon Gold (06/29/2024): Within normal limits. CBC (03/28/2024): Hemoglobin 14.3, white count 4.6, MCV 90.5, platelet count 189. Hepatic function panel (12/26/2023): AST 25, ALT 21, total bilirubin 0.8, alkaline phosphatase 52, albumin 4.9.  Radiology CT abdomen and pelvis with contrast (12/23/2023): Sigmoid diverticulosis; otherwise normal.  Pathology Rectal polyp biopsy (08/29/2024): Hyperplastic polyp. Random colon biopsies (08/29/2024): Normal. Duodenal biopsies normal (08/29/2024)     Assessment & Plan:   Irritable bowel syndrome with diarrhea Chronic diarrhea and urgency suggest non-inflammatory etiology, likely gut microbiome imbalance. No evidence of malignancy, colitis, microscopic colitis celiac disease, or pancreatic mass. Further evaluation of pancreatic function and stool inflammation needed. - Prescribed rifaximin  550 mg 3 times daily for two weeks. - Ordered stool tests for pancreatic function and inflammation; fecal calprotectin and fecal elastase. - Instructed her to collect stool samples at home and return to laboratory. - Requested MyChart message two weeks  post-rifaximin  to report  symptom changes. - Discussed conditional plan if symptoms persist post-rifaximin .  Gastroesophageal reflux disease with mild esophagitis and hiatal hernia Mild esophagitis and small hiatal hernia with rare reflux symptoms, controlled with famotidine . She prefers to avoid additional medications. - Recommended continuation of nightly famotidine . - Deferred pantoprazole  initiation. - Advised to maintain current regimen and monitor reflux symptoms.     30 minutes total spent today including patient facing time, coordination of care, reviewing medical history/procedures/pertinent radiology studies, and documentation of the encounter.  "

## 2024-09-28 NOTE — Patient Instructions (Signed)
 Your provider has requested that you go to the basement level for lab work before leaving today. Press B on the elevator. The lab is located at the first door on the left as you exit the elevator.  We have sent the following medications to your pharmacy for you to pick up at your convenience: xifaxan  550 mg three times a day x 14 days.   Continue famotidine .   _______________________________________________________  If your blood pressure at your visit was 140/90 or greater, please contact your primary care physician to follow up on this.  _______________________________________________________  If you are age 62 or older, your body mass index should be between 23-30. Your Body mass index is 36.28 kg/m. If this is out of the aforementioned range listed, please consider follow up with your Primary Care Provider.  If you are age 62 or younger, your body mass index should be between 19-25. Your Body mass index is 36.28 kg/m. If this is out of the aformentioned range listed, please consider follow up with your Primary Care Provider.   ________________________________________________________  The [facility] providers would like to encourage you to use MYCHART to communicate with providers for non-urgent requests or questions.  Due to long hold times on the telephone, sending your provider a message by [facility] may be a faster and more efficient way to get a response.  Please allow 48 business hours for a response.  Please remember that this is for non-urgent requests.  _______________________________________________________  [facility] is using a team-based approach to care.  Your team is made up of your doctor and two to three APPS. Our APPS (Nurse Practitioners and Physician Assistants) work with your physician to ensure care continuity for you. They are fully qualified to address your health concerns and develop a treatment plan. They communicate directly with your  gastroenterologist to care for you. Seeing the Advanced Practice Practitioners on your physician's team can help you by facilitating care more promptly, often allowing for earlier appointments, access to diagnostic testing, procedures, and other specialty referrals.

## 2024-09-28 NOTE — Telephone Encounter (Signed)
 PA team, can you help with Xifaxan  PA?

## 2024-10-02 ENCOUNTER — Other Ambulatory Visit (HOSPITAL_COMMUNITY): Payer: Self-pay

## 2024-10-02 ENCOUNTER — Telehealth: Payer: Self-pay

## 2024-10-02 NOTE — Telephone Encounter (Signed)
 No PA required. New Encounter has been or will be created for follow up. For additional info see Pharmacy Prior Auth telephone encounter from 10-02-2024.

## 2024-10-02 NOTE — Telephone Encounter (Signed)
 Pharmacy Patient Advocate Encounter   Received notification from CoverMyMeds that prior authorization for Xifaxan  550MG  tablet is required/requested.   Insurance verification completed.   The patient is insured through [employer].   Per test claim: The current 14 day co-pay is, $0.00.  No PA needed at this time. This test claim was processed through [facility]- copay amounts may vary at other pharmacies due to pharmacy/plan contracts, or as the patient moves through the different stages of their insurance plan.

## 2024-10-08 ENCOUNTER — Ambulatory Visit

## 2024-10-09 ENCOUNTER — Other Ambulatory Visit: Payer: Self-pay

## 2024-10-09 MED ORDER — RIFAXIMIN 550 MG PO TABS: 550.0000 mg | ORAL_TABLET | Freq: Three times a day (TID) | ORAL | 0 refills | Status: AC

## 2024-10-10 ENCOUNTER — Other Ambulatory Visit

## 2024-10-10 DIAGNOSIS — K580 Irritable bowel syndrome with diarrhea: Secondary | ICD-10-CM

## 2024-10-10 LAB — CUP PACEART REMOTE DEVICE CHECK
Battery Remaining Longevity: 5 mo
Battery Remaining Percentage: 3 %
Battery Voltage: 2.66 V
Brady Statistic AP VP Percent: 8.3 %
Brady Statistic AP VS Percent: 1 %
Brady Statistic AS VP Percent: 92 %
Brady Statistic AS VS Percent: 1 %
Brady Statistic RA Percent Paced: 8.2 %
Brady Statistic RV Percent Paced: 99 %
Date Time Interrogation Session: 20260119020015
Implantable Lead Connection Status: 753985
Implantable Lead Connection Status: 753985
Implantable Lead Implant Date: 20170608
Implantable Lead Implant Date: 20170608
Implantable Lead Location: 753859
Implantable Lead Location: 753860
Implantable Pulse Generator Implant Date: 20170608
Lead Channel Impedance Value: 440 Ohm
Lead Channel Impedance Value: 460 Ohm
Lead Channel Pacing Threshold Amplitude: 0.75 V
Lead Channel Pacing Threshold Amplitude: 1.375 V
Lead Channel Pacing Threshold Pulse Width: 0.4 ms
Lead Channel Pacing Threshold Pulse Width: 0.4 ms
Lead Channel Sensing Intrinsic Amplitude: 2.1 mV
Lead Channel Setting Pacing Amplitude: 1.625
Lead Channel Setting Pacing Amplitude: 2 V
Lead Channel Setting Pacing Pulse Width: 0.4 ms
Lead Channel Setting Sensing Sensitivity: 2.5 mV
Pulse Gen Model: 2272
Pulse Gen Serial Number: 7903881

## 2024-10-16 LAB — CALPROTECTIN: Calprotectin: 37 ug/g

## 2024-10-17 ENCOUNTER — Ambulatory Visit: Payer: Self-pay | Admitting: Internal Medicine

## 2024-10-17 LAB — PANCREATIC ELASTASE, FECAL: Pancreatic Elastase-1, Stool: >800 ug/g

## 2024-11-08 ENCOUNTER — Ambulatory Visit

## 2024-11-30 ENCOUNTER — Encounter

## 2024-12-03 ENCOUNTER — Ambulatory Visit: Admitting: Internal Medicine

## 2024-12-06 ENCOUNTER — Ambulatory Visit

## 2024-12-09 ENCOUNTER — Ambulatory Visit

## 2025-01-09 ENCOUNTER — Ambulatory Visit

## 2025-02-09 ENCOUNTER — Ambulatory Visit

## 2025-03-07 ENCOUNTER — Ambulatory Visit

## 2025-03-12 ENCOUNTER — Ambulatory Visit

## 2025-06-06 ENCOUNTER — Ambulatory Visit

## 2025-09-05 ENCOUNTER — Ambulatory Visit

## 2025-12-05 ENCOUNTER — Ambulatory Visit

## 2026-03-06 ENCOUNTER — Ambulatory Visit
